# Patient Record
Sex: Female | Born: 1935 | Race: White | Hispanic: No
Health system: Midwestern US, Community
[De-identification: ages and names within clinical notes are randomized; demographics above are authoritative.]

## PROBLEM LIST (undated history)

## (undated) DIAGNOSIS — M1990 Unspecified osteoarthritis, unspecified site: Secondary | ICD-10-CM

## (undated) DIAGNOSIS — D508 Other iron deficiency anemias: Secondary | ICD-10-CM

## (undated) DIAGNOSIS — F419 Anxiety disorder, unspecified: Secondary | ICD-10-CM

## (undated) DIAGNOSIS — L0390 Cellulitis, unspecified: Secondary | ICD-10-CM

## (undated) DIAGNOSIS — N390 Urinary tract infection, site not specified: Secondary | ICD-10-CM

## (undated) DIAGNOSIS — G894 Chronic pain syndrome: Secondary | ICD-10-CM

## (undated) DIAGNOSIS — I2510 Atherosclerotic heart disease of native coronary artery without angina pectoris: Secondary | ICD-10-CM

## (undated) DIAGNOSIS — F329 Major depressive disorder, single episode, unspecified: Secondary | ICD-10-CM

## (undated) DIAGNOSIS — A0472 Enterocolitis due to Clostridium difficile, not specified as recurrent: Secondary | ICD-10-CM

## (undated) DIAGNOSIS — G8929 Other chronic pain: Secondary | ICD-10-CM

## (undated) DIAGNOSIS — M549 Dorsalgia, unspecified: Secondary | ICD-10-CM

## (undated) DIAGNOSIS — I728 Aneurysm of other specified arteries: Secondary | ICD-10-CM

## (undated) DIAGNOSIS — E119 Type 2 diabetes mellitus without complications: Secondary | ICD-10-CM

## (undated) DIAGNOSIS — G2581 Restless legs syndrome: Secondary | ICD-10-CM

## (undated) DIAGNOSIS — E278 Other specified disorders of adrenal gland: Secondary | ICD-10-CM

## (undated) DIAGNOSIS — J449 Chronic obstructive pulmonary disease, unspecified: Secondary | ICD-10-CM

## (undated) DIAGNOSIS — K5790 Diverticulosis of intestine, part unspecified, without perforation or abscess without bleeding: Secondary | ICD-10-CM

## (undated) DIAGNOSIS — Z96612 Presence of left artificial shoulder joint: Secondary | ICD-10-CM

## (undated) DIAGNOSIS — K5520 Angiodysplasia of colon without hemorrhage: Secondary | ICD-10-CM

## (undated) DIAGNOSIS — I10 Essential (primary) hypertension: Secondary | ICD-10-CM

## (undated) DIAGNOSIS — D539 Nutritional anemia, unspecified: Secondary | ICD-10-CM

## (undated) DIAGNOSIS — K760 Fatty (change of) liver, not elsewhere classified: Secondary | ICD-10-CM

## (undated) DIAGNOSIS — F32A Depression, unspecified: Secondary | ICD-10-CM

## (undated) DIAGNOSIS — M069 Rheumatoid arthritis, unspecified: Secondary | ICD-10-CM

## (undated) DIAGNOSIS — Z951 Presence of aortocoronary bypass graft: Secondary | ICD-10-CM

## (undated) DIAGNOSIS — I509 Heart failure, unspecified: Secondary | ICD-10-CM

## (undated) DIAGNOSIS — E785 Hyperlipidemia, unspecified: Secondary | ICD-10-CM

## (undated) DIAGNOSIS — Z96611 Presence of right artificial shoulder joint: Secondary | ICD-10-CM

## (undated) DIAGNOSIS — K922 Gastrointestinal hemorrhage, unspecified: Secondary | ICD-10-CM

## (undated) DIAGNOSIS — I480 Paroxysmal atrial fibrillation: Principal | ICD-10-CM

## (undated) HISTORY — DX: Dorsalgia, unspecified: M54.9

## (undated) HISTORY — DX: Depression, unspecified: F32.A

## (undated) HISTORY — DX: Rheumatoid arthritis, unspecified: M06.9

## (undated) HISTORY — PX: ANKLE SURGERY: SHX546

## (undated) HISTORY — DX: Unspecified osteoarthritis, unspecified site: M19.90

## (undated) HISTORY — DX: Paroxysmal atrial fibrillation: I48.0

## (undated) HISTORY — PX: SPINAL CORD STIMULATOR IMPLANT: SHX2422

## (undated) HISTORY — DX: Presence of aortocoronary bypass graft: Z95.1

## (undated) HISTORY — PX: INGUINAL HERNIA REPAIR: SUR1180

## (undated) HISTORY — DX: Hyperlipidemia, unspecified: E78.5

## (undated) HISTORY — PX: PARTIAL HYSTERECTOMY: SHX80

## (undated) HISTORY — PX: NASAL SINUS SURGERY: SHX719

## (undated) HISTORY — DX: Angiodysplasia of colon without hemorrhage: K55.20

## (undated) HISTORY — PX: TRIGGER FINGER RELEASE: SHX641

## (undated) HISTORY — DX: Atherosclerotic heart disease of native coronary artery without angina pectoris: I25.10

## (undated) HISTORY — DX: Presence of left artificial shoulder joint: Z96.612

## (undated) HISTORY — DX: Type 2 diabetes mellitus without complications: E11.9

## (undated) HISTORY — PX: CARPAL TUNNEL RELEASE: SHX101

## (undated) HISTORY — DX: Essential (primary) hypertension: I10

## (undated) HISTORY — DX: Aneurysm of other specified arteries: I72.8

## (undated) HISTORY — DX: Other chronic pain: G89.29

## (undated) HISTORY — DX: Major depressive disorder, single episode, unspecified: F32.9

## (undated) HISTORY — DX: Fatty (change of) liver, not elsewhere classified: K76.0

## (undated) HISTORY — DX: Chronic obstructive pulmonary disease, unspecified: J44.9

## (undated) HISTORY — DX: Nutritional anemia, unspecified: D53.9

## (undated) HISTORY — DX: Presence of right artificial shoulder joint: Z96.611

## (undated) HISTORY — DX: Diverticulosis of intestine, part unspecified, without perforation or abscess without bleeding: K57.90

## (undated) HISTORY — DX: Anxiety disorder, unspecified: F41.9

## (undated) HISTORY — PX: WRIST SURGERY: SHX841

---

## 1983-04-03 DIAGNOSIS — M069 Rheumatoid arthritis, unspecified: Secondary | ICD-10-CM

## 1983-04-03 DIAGNOSIS — M1990 Unspecified osteoarthritis, unspecified site: Secondary | ICD-10-CM | POA: Insufficient documentation

## 1983-04-03 HISTORY — DX: Rheumatoid arthritis, unspecified: M06.9

## 1983-04-03 HISTORY — DX: Unspecified osteoarthritis, unspecified site: M19.90

## 1995-08-01 DIAGNOSIS — I10 Essential (primary) hypertension: Secondary | ICD-10-CM

## 1995-08-01 HISTORY — DX: Essential (primary) hypertension: I10

## 1997-07-31 DIAGNOSIS — E1149 Type 2 diabetes mellitus with other diabetic neurological complication: Secondary | ICD-10-CM | POA: Insufficient documentation

## 1997-07-31 DIAGNOSIS — E119 Type 2 diabetes mellitus without complications: Secondary | ICD-10-CM

## 1997-07-31 HISTORY — DX: Type 2 diabetes mellitus without complications: E11.9

## 1998-12-13 HISTORY — PX: TOTAL SHOULDER REPLACEMENT: SUR1217

## 1999-03-03 ENCOUNTER — Encounter: Payer: Self-pay | Admitting: Family Medicine

## 1999-07-02 DIAGNOSIS — E785 Hyperlipidemia, unspecified: Secondary | ICD-10-CM

## 1999-07-02 HISTORY — DX: Hyperlipidemia, unspecified: E78.5

## 1999-12-02 ENCOUNTER — Encounter: Payer: Self-pay | Admitting: Family Medicine

## 1999-12-02 LAB — CONVERTED CEMR LAB: Hgb A1c MFr Bld: 5.4 %

## 2000-12-31 DIAGNOSIS — M810 Age-related osteoporosis without current pathological fracture: Secondary | ICD-10-CM | POA: Insufficient documentation

## 2001-05-03 ENCOUNTER — Encounter: Payer: Self-pay | Admitting: Family Medicine

## 2001-10-16 ENCOUNTER — Encounter: Admission: RE | Admit: 2001-10-16 | Discharge: 2001-10-16 | Payer: Self-pay | Admitting: Family Medicine

## 2001-10-16 ENCOUNTER — Encounter: Payer: Self-pay | Admitting: Family Medicine

## 2001-12-31 ENCOUNTER — Encounter: Payer: Self-pay | Admitting: Family Medicine

## 2002-01-14 ENCOUNTER — Encounter: Payer: Self-pay | Admitting: Family Medicine

## 2002-01-14 ENCOUNTER — Encounter: Admission: RE | Admit: 2002-01-14 | Discharge: 2002-01-14 | Payer: Self-pay | Admitting: Family Medicine

## 2002-07-16 ENCOUNTER — Encounter: Payer: Self-pay | Admitting: Family Medicine

## 2002-07-16 ENCOUNTER — Encounter: Admission: RE | Admit: 2002-07-16 | Discharge: 2002-07-16 | Payer: Self-pay | Admitting: Family Medicine

## 2002-07-16 DIAGNOSIS — K7689 Other specified diseases of liver: Secondary | ICD-10-CM | POA: Insufficient documentation

## 2002-09-01 DIAGNOSIS — I2510 Atherosclerotic heart disease of native coronary artery without angina pectoris: Secondary | ICD-10-CM

## 2002-09-01 HISTORY — DX: Atherosclerotic heart disease of native coronary artery without angina pectoris: I25.10

## 2002-12-02 ENCOUNTER — Encounter: Payer: Self-pay | Admitting: Family Medicine

## 2002-12-02 LAB — CONVERTED CEMR LAB: Microalbumin U total vol: 5.2 mg/L

## 2002-12-29 ENCOUNTER — Other Ambulatory Visit: Admission: RE | Admit: 2002-12-29 | Discharge: 2002-12-29 | Payer: Self-pay | Admitting: Family Medicine

## 2003-03-03 ENCOUNTER — Encounter: Payer: Self-pay | Admitting: Family Medicine

## 2003-03-03 LAB — CONVERTED CEMR LAB: Hgb A1c MFr Bld: 6.1 %

## 2003-11-11 ENCOUNTER — Other Ambulatory Visit: Payer: Self-pay

## 2003-11-22 HISTORY — PX: ROTATOR CUFF REPAIR: SHX139

## 2003-11-24 ENCOUNTER — Other Ambulatory Visit: Payer: Self-pay

## 2004-01-01 ENCOUNTER — Encounter: Payer: Medicare Other | Admitting: Unknown Physician Specialty

## 2004-02-01 ENCOUNTER — Encounter: Payer: Medicare Other | Admitting: Unknown Physician Specialty

## 2004-02-02 ENCOUNTER — Ambulatory Visit: Payer: Self-pay | Admitting: Family Medicine

## 2004-02-21 ENCOUNTER — Ambulatory Visit: Payer: Self-pay | Admitting: Family Medicine

## 2004-03-02 ENCOUNTER — Encounter: Payer: Medicare Other | Admitting: Unknown Physician Specialty

## 2004-04-02 ENCOUNTER — Encounter: Payer: Medicare Other | Admitting: Unknown Physician Specialty

## 2004-04-02 ENCOUNTER — Encounter: Payer: Self-pay | Admitting: Family Medicine

## 2004-04-02 LAB — CONVERTED CEMR LAB: Hgb A1c MFr Bld: 5.6 %

## 2004-05-02 ENCOUNTER — Ambulatory Visit: Payer: Self-pay | Admitting: Family Medicine

## 2004-05-03 ENCOUNTER — Encounter: Payer: Self-pay | Admitting: Family Medicine

## 2004-05-03 ENCOUNTER — Encounter: Payer: Medicare Other | Admitting: Unknown Physician Specialty

## 2004-05-04 ENCOUNTER — Ambulatory Visit: Payer: Self-pay | Admitting: Family Medicine

## 2004-05-15 ENCOUNTER — Ambulatory Visit: Payer: Self-pay | Admitting: Family Medicine

## 2004-05-31 ENCOUNTER — Encounter: Payer: Medicare Other | Admitting: Unknown Physician Specialty

## 2004-05-31 DIAGNOSIS — J449 Chronic obstructive pulmonary disease, unspecified: Secondary | ICD-10-CM

## 2004-05-31 HISTORY — DX: Chronic obstructive pulmonary disease, unspecified: J44.9

## 2004-06-07 ENCOUNTER — Encounter: Admission: RE | Admit: 2004-06-07 | Discharge: 2004-06-07 | Payer: Self-pay | Admitting: Specialist

## 2004-06-18 ENCOUNTER — Inpatient Hospital Stay: Payer: Medicare Other

## 2004-07-01 DIAGNOSIS — D509 Iron deficiency anemia, unspecified: Secondary | ICD-10-CM

## 2004-07-01 DIAGNOSIS — D539 Nutritional anemia, unspecified: Secondary | ICD-10-CM

## 2004-07-01 HISTORY — DX: Nutritional anemia, unspecified: D53.9

## 2004-07-04 ENCOUNTER — Ambulatory Visit: Payer: Self-pay | Admitting: Family Medicine

## 2004-07-19 ENCOUNTER — Ambulatory Visit: Payer: Medicare Other | Admitting: Internal Medicine

## 2004-07-31 ENCOUNTER — Ambulatory Visit: Payer: Medicare Other | Admitting: Internal Medicine

## 2004-08-31 ENCOUNTER — Ambulatory Visit: Payer: Medicare Other | Admitting: Internal Medicine

## 2004-09-21 ENCOUNTER — Ambulatory Visit: Payer: Self-pay | Admitting: Family Medicine

## 2004-09-30 ENCOUNTER — Ambulatory Visit: Payer: Medicare Other | Admitting: Internal Medicine

## 2004-10-30 ENCOUNTER — Ambulatory Visit: Payer: Self-pay | Admitting: Family Medicine

## 2004-10-31 ENCOUNTER — Encounter: Payer: Self-pay | Admitting: Family Medicine

## 2004-10-31 LAB — CONVERTED CEMR LAB
Hgb A1c MFr Bld: 6 %
Pap Smear: NORMAL

## 2004-11-01 ENCOUNTER — Other Ambulatory Visit: Admission: RE | Admit: 2004-11-01 | Discharge: 2004-11-01 | Payer: Self-pay | Admitting: Family Medicine

## 2004-11-01 ENCOUNTER — Ambulatory Visit: Payer: Self-pay | Admitting: Family Medicine

## 2004-11-03 ENCOUNTER — Ambulatory Visit: Payer: Medicare Other | Admitting: Internal Medicine

## 2004-11-08 ENCOUNTER — Ambulatory Visit: Payer: Self-pay | Admitting: Cardiology

## 2004-11-27 ENCOUNTER — Ambulatory Visit: Payer: Self-pay | Admitting: Family Medicine

## 2004-12-01 ENCOUNTER — Ambulatory Visit: Payer: Medicare Other | Admitting: Internal Medicine

## 2004-12-11 ENCOUNTER — Ambulatory Visit: Payer: Self-pay | Admitting: Family Medicine

## 2004-12-28 ENCOUNTER — Ambulatory Visit: Payer: Self-pay | Admitting: Family Medicine

## 2005-01-01 ENCOUNTER — Ambulatory Visit: Payer: Self-pay | Admitting: Family Medicine

## 2005-02-01 ENCOUNTER — Ambulatory Visit: Payer: Medicare Other | Admitting: Anesthesiology

## 2005-02-06 ENCOUNTER — Ambulatory Visit: Payer: Medicare Other | Admitting: Anesthesiology

## 2005-02-28 ENCOUNTER — Ambulatory Visit: Payer: Medicare Other | Admitting: Anesthesiology

## 2005-03-01 ENCOUNTER — Ambulatory Visit: Payer: Medicare Other | Admitting: Anesthesiology

## 2005-04-04 ENCOUNTER — Ambulatory Visit: Payer: Self-pay | Admitting: Family Medicine

## 2005-04-06 ENCOUNTER — Ambulatory Visit: Payer: Self-pay | Admitting: Family Medicine

## 2005-04-10 ENCOUNTER — Ambulatory Visit: Payer: Medicare Other | Admitting: Anesthesiology

## 2005-05-03 HISTORY — PX: EPIDURAL BLOCK INJECTION: SHX1516

## 2005-05-06 ENCOUNTER — Emergency Department: Payer: Medicare Other | Admitting: Emergency Medicine

## 2005-05-06 ENCOUNTER — Other Ambulatory Visit: Payer: Self-pay

## 2005-05-09 ENCOUNTER — Ambulatory Visit: Payer: Self-pay | Admitting: Family Medicine

## 2005-05-10 ENCOUNTER — Other Ambulatory Visit: Payer: Self-pay

## 2005-05-11 ENCOUNTER — Inpatient Hospital Stay: Payer: Medicare Other | Admitting: Internal Medicine

## 2005-05-23 ENCOUNTER — Ambulatory Visit: Payer: Self-pay | Admitting: Family Medicine

## 2005-05-26 ENCOUNTER — Ambulatory Visit: Payer: Self-pay | Admitting: Family Medicine

## 2005-05-30 ENCOUNTER — Ambulatory Visit: Payer: Medicare Other | Admitting: Anesthesiology

## 2005-05-30 ENCOUNTER — Ambulatory Visit: Payer: Self-pay | Admitting: Family Medicine

## 2005-06-06 ENCOUNTER — Ambulatory Visit: Payer: Self-pay | Admitting: Family Medicine

## 2005-06-14 ENCOUNTER — Ambulatory Visit: Payer: Medicare Other | Admitting: Anesthesiology

## 2005-06-20 ENCOUNTER — Ambulatory Visit: Payer: Self-pay | Admitting: Family Medicine

## 2005-06-21 ENCOUNTER — Ambulatory Visit: Payer: Medicare Other | Admitting: Anesthesiology

## 2005-06-29 ENCOUNTER — Ambulatory Visit: Payer: Self-pay | Admitting: Family Medicine

## 2005-07-31 ENCOUNTER — Ambulatory Visit: Payer: Medicare Other | Admitting: Anesthesiology

## 2005-08-16 ENCOUNTER — Ambulatory Visit: Payer: Self-pay | Admitting: Family Medicine

## 2005-08-29 ENCOUNTER — Ambulatory Visit: Payer: Medicare Other | Admitting: Anesthesiology

## 2005-09-26 ENCOUNTER — Ambulatory Visit: Payer: Self-pay | Admitting: Family Medicine

## 2005-10-31 HISTORY — PX: OTHER SURGICAL HISTORY: SHX169

## 2005-11-20 ENCOUNTER — Ambulatory Visit: Payer: Medicare Other | Admitting: Anesthesiology

## 2005-12-12 ENCOUNTER — Ambulatory Visit: Payer: Self-pay | Admitting: Family Medicine

## 2005-12-18 ENCOUNTER — Ambulatory Visit: Payer: Medicare Other | Admitting: Anesthesiology

## 2006-01-17 ENCOUNTER — Ambulatory Visit: Payer: Medicare Other | Admitting: Anesthesiology

## 2006-01-18 ENCOUNTER — Ambulatory Visit: Payer: Self-pay | Admitting: Family Medicine

## 2006-01-28 ENCOUNTER — Ambulatory Visit: Payer: Self-pay | Admitting: Family Medicine

## 2006-01-29 ENCOUNTER — Ambulatory Visit: Payer: Medicare Other | Admitting: Internal Medicine

## 2006-02-07 ENCOUNTER — Ambulatory Visit: Payer: Medicare Other | Admitting: Internal Medicine

## 2006-02-14 ENCOUNTER — Ambulatory Visit: Payer: Medicare Other | Admitting: Anesthesiology

## 2006-02-19 ENCOUNTER — Ambulatory Visit: Payer: Self-pay | Admitting: Family Medicine

## 2006-03-02 ENCOUNTER — Ambulatory Visit: Payer: Medicare Other | Admitting: Internal Medicine

## 2006-03-18 ENCOUNTER — Ambulatory Visit: Payer: Medicare Other | Admitting: Anesthesiology

## 2006-04-29 ENCOUNTER — Ambulatory Visit: Payer: Medicare Other | Admitting: Internal Medicine

## 2006-05-01 ENCOUNTER — Ambulatory Visit: Payer: Self-pay | Admitting: Family Medicine

## 2006-05-03 ENCOUNTER — Ambulatory Visit: Payer: Medicare Other | Admitting: Internal Medicine

## 2006-05-13 ENCOUNTER — Ambulatory Visit: Payer: Medicare Other | Admitting: Anesthesiology

## 2006-05-18 ENCOUNTER — Ambulatory Visit: Payer: Medicare Other | Admitting: Anesthesiology

## 2006-06-01 HISTORY — PX: LAMINECTOMY: SHX219

## 2006-06-13 ENCOUNTER — Ambulatory Visit: Payer: Medicare Other | Admitting: Anesthesiology

## 2006-06-13 HISTORY — PX: OTHER SURGICAL HISTORY: SHX169

## 2006-06-19 ENCOUNTER — Ambulatory Visit: Payer: Self-pay | Admitting: Family Medicine

## 2006-07-02 ENCOUNTER — Ambulatory Visit: Payer: Medicare Other | Admitting: Internal Medicine

## 2006-07-21 ENCOUNTER — Encounter: Payer: Self-pay | Admitting: Family Medicine

## 2006-07-21 DIAGNOSIS — G4700 Insomnia, unspecified: Secondary | ICD-10-CM

## 2006-07-31 ENCOUNTER — Ambulatory Visit: Payer: Medicare Other | Admitting: Anesthesiology

## 2006-08-16 ENCOUNTER — Ambulatory Visit: Payer: Medicare Other | Admitting: Internal Medicine

## 2006-08-16 ENCOUNTER — Encounter: Payer: Self-pay | Admitting: Family Medicine

## 2006-08-19 ENCOUNTER — Encounter: Payer: Self-pay | Admitting: Family Medicine

## 2006-09-01 ENCOUNTER — Ambulatory Visit: Payer: Medicare Other | Admitting: Internal Medicine

## 2006-09-03 ENCOUNTER — Encounter: Payer: Self-pay | Admitting: Family Medicine

## 2006-09-03 ENCOUNTER — Ambulatory Visit: Payer: Medicare Other | Admitting: Anesthesiology

## 2006-09-11 ENCOUNTER — Ambulatory Visit: Payer: Self-pay | Admitting: Family Medicine

## 2006-09-16 ENCOUNTER — Telehealth (INDEPENDENT_AMBULATORY_CARE_PROVIDER_SITE_OTHER): Payer: Self-pay | Admitting: *Deleted

## 2006-10-01 ENCOUNTER — Ambulatory Visit: Payer: Medicare Other | Admitting: Internal Medicine

## 2006-10-03 ENCOUNTER — Ambulatory Visit: Payer: Self-pay | Admitting: Family Medicine

## 2006-10-18 ENCOUNTER — Encounter: Payer: Self-pay | Admitting: Family Medicine

## 2006-11-01 ENCOUNTER — Ambulatory Visit: Payer: Medicare Other | Admitting: Internal Medicine

## 2006-11-04 ENCOUNTER — Ambulatory Visit: Payer: Medicare Other | Admitting: Anesthesiology

## 2006-11-04 ENCOUNTER — Encounter: Payer: Self-pay | Admitting: Family Medicine

## 2006-11-08 ENCOUNTER — Ambulatory Visit: Payer: Medicare Other | Admitting: Gastroenterology

## 2006-11-08 ENCOUNTER — Encounter: Payer: Self-pay | Admitting: Physician Assistant

## 2006-11-08 DIAGNOSIS — Q279 Congenital malformation of peripheral vascular system, unspecified: Secondary | ICD-10-CM | POA: Insufficient documentation

## 2006-11-15 ENCOUNTER — Ambulatory Visit: Payer: Medicare Other | Admitting: Gastroenterology

## 2006-11-27 ENCOUNTER — Encounter: Payer: Self-pay | Admitting: Family Medicine

## 2006-12-02 ENCOUNTER — Ambulatory Visit: Payer: Medicare Other | Admitting: Internal Medicine

## 2006-12-04 ENCOUNTER — Ambulatory Visit: Payer: Self-pay | Admitting: Family Medicine

## 2006-12-04 LAB — CONVERTED CEMR LAB
AST: 18 units/L (ref 0–37)
CO2: 30 meq/L (ref 19–32)
Calcium: 9.5 mg/dL (ref 8.4–10.5)
Chloride: 104 meq/L (ref 96–112)
Creatinine, Ser: 0.9 mg/dL (ref 0.4–1.2)
Glucose, Bld: 90 mg/dL (ref 70–99)

## 2006-12-06 ENCOUNTER — Ambulatory Visit: Payer: Self-pay | Admitting: Family Medicine

## 2006-12-10 ENCOUNTER — Encounter: Payer: Self-pay | Admitting: Family Medicine

## 2006-12-10 ENCOUNTER — Ambulatory Visit: Payer: Medicare Other | Admitting: Anesthesiology

## 2007-01-10 ENCOUNTER — Encounter: Payer: Self-pay | Admitting: Family Medicine

## 2007-01-10 ENCOUNTER — Ambulatory Visit: Payer: Medicare Other | Admitting: Anesthesiology

## 2007-01-23 ENCOUNTER — Ambulatory Visit: Payer: Self-pay | Admitting: Family Medicine

## 2007-01-23 LAB — CONVERTED CEMR LAB
Blood in Urine, dipstick: NEGATIVE
Ketones, urine, test strip: NEGATIVE
Urobilinogen, UA: 0.2

## 2007-01-28 ENCOUNTER — Ambulatory Visit: Payer: Medicare Other | Admitting: Anesthesiology

## 2007-02-04 ENCOUNTER — Encounter: Payer: Self-pay | Admitting: Family Medicine

## 2007-02-04 ENCOUNTER — Ambulatory Visit: Payer: Medicare Other | Admitting: Anesthesiology

## 2007-02-20 ENCOUNTER — Encounter: Payer: Self-pay | Admitting: Family Medicine

## 2007-02-25 ENCOUNTER — Encounter: Payer: Medicare Other | Admitting: General Practice

## 2007-03-03 ENCOUNTER — Encounter: Payer: Medicare Other | Admitting: General Practice

## 2007-03-03 ENCOUNTER — Ambulatory Visit: Payer: Medicare Other | Admitting: Internal Medicine

## 2007-03-06 ENCOUNTER — Ambulatory Visit: Payer: Medicare Other | Admitting: Anesthesiology

## 2007-03-06 ENCOUNTER — Encounter: Payer: Self-pay | Admitting: Family Medicine

## 2007-03-13 ENCOUNTER — Ambulatory Visit: Payer: Medicare Other | Admitting: Internal Medicine

## 2007-03-13 ENCOUNTER — Encounter: Payer: Self-pay | Admitting: Family Medicine

## 2007-03-17 ENCOUNTER — Ambulatory Visit: Payer: Self-pay | Admitting: Family Medicine

## 2007-03-17 ENCOUNTER — Emergency Department: Payer: Medicare Other | Admitting: Emergency Medicine

## 2007-03-17 DIAGNOSIS — M79609 Pain in unspecified limb: Secondary | ICD-10-CM | POA: Insufficient documentation

## 2007-03-24 ENCOUNTER — Ambulatory Visit: Payer: Self-pay

## 2007-03-24 ENCOUNTER — Encounter: Payer: Self-pay | Admitting: Family Medicine

## 2007-03-24 ENCOUNTER — Ambulatory Visit: Payer: Self-pay | Admitting: Cardiovascular Disease

## 2007-04-03 ENCOUNTER — Ambulatory Visit: Payer: Medicare Other | Admitting: Internal Medicine

## 2007-04-03 ENCOUNTER — Encounter: Payer: Medicare Other | Admitting: General Practice

## 2007-04-14 ENCOUNTER — Ambulatory Visit: Payer: Medicare Other | Admitting: Anesthesiology

## 2007-04-14 ENCOUNTER — Encounter: Payer: Self-pay | Admitting: Family Medicine

## 2007-04-17 ENCOUNTER — Ambulatory Visit: Payer: Self-pay | Admitting: Family Medicine

## 2007-04-21 ENCOUNTER — Ambulatory Visit: Payer: Medicare Other | Admitting: Pain Medicine

## 2007-05-04 ENCOUNTER — Encounter: Payer: Medicare Other | Admitting: General Practice

## 2007-05-07 ENCOUNTER — Ambulatory Visit: Payer: Medicare Other | Admitting: Anesthesiology

## 2007-05-07 ENCOUNTER — Encounter: Payer: Self-pay | Admitting: Family Medicine

## 2007-05-10 ENCOUNTER — Ambulatory Visit: Payer: Self-pay | Admitting: Family Medicine

## 2007-05-15 ENCOUNTER — Ambulatory Visit: Payer: Self-pay | Admitting: Family Medicine

## 2007-05-19 ENCOUNTER — Ambulatory Visit: Payer: Self-pay | Admitting: Family Medicine

## 2007-05-30 ENCOUNTER — Encounter: Payer: Self-pay | Admitting: Physician Assistant

## 2007-05-30 ENCOUNTER — Ambulatory Visit: Payer: Medicare Other | Admitting: Gastroenterology

## 2007-06-01 ENCOUNTER — Encounter: Payer: Medicare Other | Admitting: General Practice

## 2007-06-06 ENCOUNTER — Ambulatory Visit: Payer: Medicare Other | Admitting: Gastroenterology

## 2007-06-06 ENCOUNTER — Encounter: Payer: Self-pay | Admitting: Physician Assistant

## 2007-06-11 ENCOUNTER — Ambulatory Visit: Payer: Self-pay | Admitting: Internal Medicine

## 2007-06-20 ENCOUNTER — Ambulatory Visit: Payer: Self-pay | Admitting: Internal Medicine

## 2007-06-20 ENCOUNTER — Encounter: Admission: RE | Admit: 2007-06-20 | Discharge: 2007-06-20 | Payer: Self-pay | Admitting: Internal Medicine

## 2007-06-27 ENCOUNTER — Inpatient Hospital Stay (HOSPITAL_COMMUNITY): Admission: RE | Admit: 2007-06-27 | Discharge: 2007-07-02 | Payer: Self-pay | Admitting: Neurosurgery

## 2007-07-01 ENCOUNTER — Ambulatory Visit: Payer: Self-pay | Admitting: Physical Medicine & Rehabilitation

## 2007-07-21 ENCOUNTER — Ambulatory Visit: Payer: Self-pay | Admitting: Family Medicine

## 2007-07-21 DIAGNOSIS — L259 Unspecified contact dermatitis, unspecified cause: Secondary | ICD-10-CM

## 2007-07-21 LAB — CONVERTED CEMR LAB
Bilirubin Urine: NEGATIVE
Ketones, urine, test strip: NEGATIVE
Protein, U semiquant: 100

## 2007-08-01 ENCOUNTER — Ambulatory Visit: Payer: Medicare Other | Admitting: Internal Medicine

## 2007-08-15 ENCOUNTER — Ambulatory Visit: Payer: Medicare Other | Admitting: Internal Medicine

## 2007-08-17 ENCOUNTER — Encounter: Payer: Self-pay | Admitting: Family Medicine

## 2007-08-22 ENCOUNTER — Encounter: Payer: Self-pay | Admitting: Physician Assistant

## 2007-09-01 ENCOUNTER — Ambulatory Visit: Payer: Medicare Other | Admitting: Internal Medicine

## 2007-09-12 ENCOUNTER — Ambulatory Visit: Payer: Medicare Other | Admitting: Anesthesiology

## 2007-10-01 ENCOUNTER — Encounter (INDEPENDENT_AMBULATORY_CARE_PROVIDER_SITE_OTHER): Payer: Self-pay | Admitting: Physician Assistant

## 2007-10-09 ENCOUNTER — Encounter: Payer: Self-pay | Admitting: Family Medicine

## 2007-10-09 ENCOUNTER — Ambulatory Visit: Payer: Medicare Other | Admitting: Anesthesiology

## 2007-11-03 ENCOUNTER — Encounter: Payer: Self-pay | Admitting: Family Medicine

## 2007-11-03 ENCOUNTER — Ambulatory Visit: Payer: Medicare Other | Admitting: Anesthesiology

## 2007-11-12 ENCOUNTER — Encounter: Payer: Self-pay | Admitting: Physician Assistant

## 2007-12-01 ENCOUNTER — Encounter: Payer: Medicare Other | Admitting: Neurosurgery

## 2007-12-02 ENCOUNTER — Ambulatory Visit: Payer: Medicare Other | Admitting: Anesthesiology

## 2007-12-02 ENCOUNTER — Encounter: Payer: Medicare Other | Admitting: Neurosurgery

## 2007-12-16 ENCOUNTER — Ambulatory Visit: Payer: Medicare Other | Admitting: Anesthesiology

## 2008-01-01 ENCOUNTER — Encounter: Payer: Medicare Other | Admitting: Neurosurgery

## 2008-01-16 ENCOUNTER — Encounter: Admission: RE | Admit: 2008-01-16 | Discharge: 2008-01-16 | Payer: Self-pay | Admitting: Neurosurgery

## 2008-01-19 ENCOUNTER — Ambulatory Visit: Payer: Medicare Other | Admitting: Anesthesiology

## 2008-01-21 ENCOUNTER — Ambulatory Visit: Payer: Self-pay | Admitting: Family Medicine

## 2008-01-21 LAB — CONVERTED CEMR LAB
AST: 16 units/L (ref 0–37)
Albumin: 4 g/dL (ref 3.5–5.2)
BUN: 55 mg/dL — ABNORMAL HIGH (ref 6–23)
Basophils Absolute: 0 10*3/uL (ref 0.0–0.1)
Basophils Relative: 0.3 % (ref 0.0–3.0)
Calcium: 9.5 mg/dL (ref 8.4–10.5)
Chloride: 103 meq/L (ref 96–112)
Creatinine, Ser: 1.3 mg/dL — ABNORMAL HIGH (ref 0.4–1.2)
Creatinine,U: 99.4 mg/dL
Eosinophils Absolute: 0.1 10*3/uL (ref 0.0–0.7)
GFR calc Af Amer: 51 mL/min
GFR calc non Af Amer: 43 mL/min
HCT: 34.9 % — ABNORMAL LOW (ref 36.0–46.0)
Hgb A1c MFr Bld: 5.7 % (ref 4.6–6.0)
MCHC: 34.6 g/dL (ref 30.0–36.0)
MCV: 92 fL (ref 78.0–100.0)
Microalb Creat Ratio: 6 mg/g (ref 0.0–30.0)
Microalb, Ur: 0.6 mg/dL (ref 0.0–1.9)
Monocytes Absolute: 0.8 10*3/uL (ref 0.1–1.0)
Neutrophils Relative %: 61.3 % (ref 43.0–77.0)
RBC: 3.79 M/uL — ABNORMAL LOW (ref 3.87–5.11)
TSH: 0.96 microintl units/mL (ref 0.35–5.50)
Total Bilirubin: 0.6 mg/dL (ref 0.3–1.2)

## 2008-01-30 ENCOUNTER — Ambulatory Visit: Payer: Medicare Other | Admitting: Internal Medicine

## 2008-02-01 ENCOUNTER — Encounter: Payer: Medicare Other | Admitting: Neurosurgery

## 2008-02-24 ENCOUNTER — Encounter: Payer: Self-pay | Admitting: Family Medicine

## 2008-02-24 ENCOUNTER — Inpatient Hospital Stay (HOSPITAL_COMMUNITY): Admission: RE | Admit: 2008-02-24 | Discharge: 2008-02-29 | Payer: Self-pay | Admitting: Neurosurgery

## 2008-02-24 HISTORY — PX: LAMINECTOMY: SHX219

## 2008-03-09 ENCOUNTER — Telehealth: Payer: Self-pay | Admitting: Family Medicine

## 2008-03-09 ENCOUNTER — Ambulatory Visit: Payer: Self-pay | Admitting: Family Medicine

## 2008-03-10 ENCOUNTER — Encounter: Payer: Self-pay | Admitting: Family Medicine

## 2008-03-10 ENCOUNTER — Telehealth: Payer: Self-pay | Admitting: Family Medicine

## 2008-03-11 ENCOUNTER — Telehealth: Payer: Self-pay | Admitting: Family Medicine

## 2008-03-16 ENCOUNTER — Ambulatory Visit: Payer: Self-pay | Admitting: Internal Medicine

## 2008-03-16 ENCOUNTER — Inpatient Hospital Stay (HOSPITAL_COMMUNITY): Admission: EM | Admit: 2008-03-16 | Discharge: 2008-03-27 | Payer: Self-pay | Admitting: Emergency Medicine

## 2008-03-18 ENCOUNTER — Ambulatory Visit: Payer: Self-pay | Admitting: Gastroenterology

## 2008-03-30 ENCOUNTER — Ambulatory Visit: Payer: Self-pay | Admitting: Family Medicine

## 2008-04-06 ENCOUNTER — Telehealth: Payer: Self-pay | Admitting: Family Medicine

## 2008-04-07 ENCOUNTER — Encounter: Payer: Self-pay | Admitting: Internal Medicine

## 2008-04-07 ENCOUNTER — Ambulatory Visit: Payer: Self-pay | Admitting: Family Medicine

## 2008-04-07 ENCOUNTER — Telehealth: Payer: Self-pay | Admitting: Family Medicine

## 2008-04-07 ENCOUNTER — Ambulatory Visit: Payer: Self-pay | Admitting: Internal Medicine

## 2008-04-07 DIAGNOSIS — E669 Obesity, unspecified: Secondary | ICD-10-CM | POA: Insufficient documentation

## 2008-04-08 ENCOUNTER — Encounter: Payer: Self-pay | Admitting: Family Medicine

## 2008-04-08 ENCOUNTER — Ambulatory Visit: Payer: Self-pay | Admitting: Internal Medicine

## 2008-04-08 ENCOUNTER — Inpatient Hospital Stay (HOSPITAL_COMMUNITY): Admission: EM | Admit: 2008-04-08 | Discharge: 2008-04-22 | Payer: Self-pay | Admitting: Emergency Medicine

## 2008-04-08 LAB — CONVERTED CEMR LAB
ALT: 11 units/L (ref 0–35)
AST: 21 units/L (ref 0–37)
Basophils Absolute: 0.1 10*3/uL (ref 0.0–0.1)
Basophils Relative: 0.6 % (ref 0.0–3.0)
Bilirubin, Direct: 0.1 mg/dL (ref 0.0–0.3)
CO2: 28 meq/L (ref 19–32)
Calcium: 8.9 mg/dL (ref 8.4–10.5)
Chloride: 93 meq/L — ABNORMAL LOW (ref 96–112)
Creatinine, Ser: 1 mg/dL (ref 0.4–1.2)
Eosinophils Absolute: 0 10*3/uL (ref 0.0–0.7)
GFR calc non Af Amer: 58 mL/min
Lipase: 13 units/L (ref 11.0–59.0)
Lymphocytes Relative: 5 % — ABNORMAL LOW (ref 12.0–46.0)
MCHC: 34.6 g/dL (ref 30.0–36.0)
MCV: 89.7 fL (ref 78.0–100.0)
Neutrophils Relative %: 87.9 % — ABNORMAL HIGH (ref 43.0–77.0)
Platelets: 224 10*3/uL (ref 150–400)
RDW: 14.5 % (ref 11.5–14.6)
Sodium: 132 meq/L — ABNORMAL LOW (ref 135–145)
TSH: 0.88 microintl units/mL (ref 0.35–5.50)
Total Bilirubin: 0.9 mg/dL (ref 0.3–1.2)

## 2008-04-11 ENCOUNTER — Encounter: Payer: Self-pay | Admitting: Family Medicine

## 2008-04-12 ENCOUNTER — Encounter: Payer: Self-pay | Admitting: Internal Medicine

## 2008-04-12 ENCOUNTER — Encounter: Payer: Self-pay | Admitting: Family Medicine

## 2008-04-15 ENCOUNTER — Encounter: Payer: Self-pay | Admitting: Family Medicine

## 2008-04-19 DIAGNOSIS — F341 Dysthymic disorder: Secondary | ICD-10-CM

## 2008-04-22 ENCOUNTER — Encounter: Payer: Self-pay | Admitting: Family Medicine

## 2008-05-03 ENCOUNTER — Ambulatory Visit: Payer: Self-pay | Admitting: Family Medicine

## 2008-05-04 LAB — CONVERTED CEMR LAB
ALT: 13 units/L (ref 0–35)
Albumin: 3.4 g/dL — ABNORMAL LOW (ref 3.5–5.2)
BUN: 17 mg/dL (ref 6–23)
CO2: 26 meq/L (ref 19–32)
Calcium: 9.3 mg/dL (ref 8.4–10.5)
Creatinine, Ser: 0.8 mg/dL (ref 0.4–1.2)
GFR calc Af Amer: 90 mL/min
Glucose, Bld: 124 mg/dL — ABNORMAL HIGH (ref 70–99)
HCT: 31.5 % — ABNORMAL LOW (ref 36.0–46.0)
Hemoglobin: 10.8 g/dL — ABNORMAL LOW (ref 12.0–15.0)
Magnesium: 1.8 mg/dL (ref 1.5–2.5)
RBC: 3.52 M/uL — ABNORMAL LOW (ref 3.87–5.11)
Total Protein: 7.5 g/dL (ref 6.0–8.3)

## 2008-05-06 ENCOUNTER — Telehealth: Payer: Self-pay | Admitting: Family Medicine

## 2008-05-14 DIAGNOSIS — K5730 Diverticulosis of large intestine without perforation or abscess without bleeding: Secondary | ICD-10-CM | POA: Insufficient documentation

## 2008-05-14 DIAGNOSIS — K5289 Other specified noninfective gastroenteritis and colitis: Secondary | ICD-10-CM

## 2008-05-18 ENCOUNTER — Ambulatory Visit: Payer: Self-pay | Admitting: Family Medicine

## 2008-05-20 LAB — CONVERTED CEMR LAB
Eosinophils Absolute: 0.2 10*3/uL (ref 0.0–0.7)
Eosinophils Relative: 2.8 % (ref 0.0–5.0)
Lymphocytes Relative: 37.1 % (ref 12.0–46.0)
MCV: 91.3 fL (ref 78.0–100.0)
Monocytes Relative: 8.3 % (ref 3.0–12.0)
Neutrophils Relative %: 51.7 % (ref 43.0–77.0)
Platelets: 219 10*3/uL (ref 150–400)
WBC: 7.6 10*3/uL (ref 4.5–10.5)

## 2008-05-28 ENCOUNTER — Telehealth (INDEPENDENT_AMBULATORY_CARE_PROVIDER_SITE_OTHER): Payer: Self-pay | Admitting: Internal Medicine

## 2008-05-30 ENCOUNTER — Ambulatory Visit: Payer: Self-pay | Admitting: Cardiology

## 2008-05-30 ENCOUNTER — Telehealth: Payer: Self-pay | Admitting: Family Medicine

## 2008-05-30 ENCOUNTER — Inpatient Hospital Stay (HOSPITAL_COMMUNITY): Admission: EM | Admit: 2008-05-30 | Discharge: 2008-06-07 | Payer: Self-pay | Admitting: Emergency Medicine

## 2008-05-30 ENCOUNTER — Ambulatory Visit: Payer: Self-pay | Admitting: Internal Medicine

## 2008-05-31 ENCOUNTER — Telehealth: Payer: Self-pay | Admitting: Family Medicine

## 2008-05-31 ENCOUNTER — Encounter (INDEPENDENT_AMBULATORY_CARE_PROVIDER_SITE_OTHER): Payer: Self-pay | Admitting: *Deleted

## 2008-05-31 ENCOUNTER — Ambulatory Visit: Payer: Self-pay | Admitting: Internal Medicine

## 2008-06-01 ENCOUNTER — Ambulatory Visit: Payer: Self-pay | Admitting: Internal Medicine

## 2008-06-04 ENCOUNTER — Encounter: Payer: Self-pay | Admitting: Internal Medicine

## 2008-06-23 ENCOUNTER — Ambulatory Visit: Payer: Self-pay | Admitting: Family Medicine

## 2008-07-13 ENCOUNTER — Encounter: Payer: Self-pay | Admitting: Family Medicine

## 2008-07-26 ENCOUNTER — Ambulatory Visit: Payer: Self-pay | Admitting: Family Medicine

## 2008-08-03 ENCOUNTER — Telehealth: Payer: Self-pay | Admitting: Internal Medicine

## 2008-08-12 ENCOUNTER — Ambulatory Visit: Payer: Self-pay | Admitting: Internal Medicine

## 2008-09-01 ENCOUNTER — Encounter: Payer: Self-pay | Admitting: Family Medicine

## 2008-09-03 ENCOUNTER — Telehealth (INDEPENDENT_AMBULATORY_CARE_PROVIDER_SITE_OTHER): Payer: Self-pay | Admitting: Internal Medicine

## 2008-09-22 ENCOUNTER — Telehealth: Payer: Self-pay | Admitting: Family Medicine

## 2008-09-24 ENCOUNTER — Ambulatory Visit: Payer: Self-pay | Admitting: Family Medicine

## 2008-09-24 LAB — CONVERTED CEMR LAB
Basophils Relative: 0.2 % (ref 0.0–3.0)
Calcium: 9.3 mg/dL (ref 8.4–10.5)
Chloride: 107 meq/L (ref 96–112)
Glucose, Bld: 92 mg/dL (ref 70–99)
Hemoglobin: 12.7 g/dL (ref 12.0–15.0)
Lymphocytes Relative: 36.5 % (ref 12.0–46.0)
MCHC: 34.5 g/dL (ref 30.0–36.0)
Monocytes Relative: 8.6 % (ref 3.0–12.0)
Neutro Abs: 2.6 10*3/uL (ref 1.4–7.7)
Potassium: 3.7 meq/L (ref 3.5–5.1)
RBC: 4 M/uL (ref 3.87–5.11)
Sodium: 143 meq/L (ref 135–145)

## 2008-09-28 ENCOUNTER — Ambulatory Visit: Payer: Self-pay | Admitting: Family Medicine

## 2008-10-11 ENCOUNTER — Telehealth: Payer: Self-pay | Admitting: Family Medicine

## 2008-10-21 ENCOUNTER — Encounter: Payer: Self-pay | Admitting: Family Medicine

## 2008-10-26 ENCOUNTER — Ambulatory Visit: Payer: Self-pay | Admitting: Family Medicine

## 2008-10-26 LAB — CONVERTED CEMR LAB
Glucose, Urine, Semiquant: NEGATIVE
Specific Gravity, Urine: 1.02
pH: 6

## 2008-10-27 ENCOUNTER — Encounter: Payer: Self-pay | Admitting: Family Medicine

## 2008-11-01 ENCOUNTER — Encounter: Admission: RE | Admit: 2008-11-01 | Discharge: 2008-11-01 | Payer: Self-pay | Admitting: Neurosurgery

## 2008-11-02 ENCOUNTER — Telehealth: Payer: Self-pay | Admitting: Family Medicine

## 2008-12-02 ENCOUNTER — Encounter: Payer: Self-pay | Admitting: Family Medicine

## 2008-12-16 ENCOUNTER — Encounter: Admission: RE | Admit: 2008-12-16 | Discharge: 2008-12-16 | Payer: Self-pay | Admitting: Neurosurgery

## 2008-12-22 ENCOUNTER — Ambulatory Visit: Payer: Self-pay | Admitting: Family Medicine

## 2008-12-22 LAB — CONVERTED CEMR LAB
Glucose, Urine, Semiquant: NEGATIVE
Ketones, urine, test strip: NEGATIVE
Urobilinogen, UA: 0.2

## 2008-12-23 ENCOUNTER — Encounter: Payer: Self-pay | Admitting: Family Medicine

## 2008-12-29 ENCOUNTER — Ambulatory Visit: Payer: Self-pay | Admitting: Family Medicine

## 2009-01-06 ENCOUNTER — Telehealth: Payer: Self-pay | Admitting: Family Medicine

## 2009-01-07 ENCOUNTER — Ambulatory Visit: Payer: Self-pay | Admitting: Family Medicine

## 2009-01-17 ENCOUNTER — Ambulatory Visit: Payer: Self-pay | Admitting: Family Medicine

## 2009-01-17 LAB — CONVERTED CEMR LAB
Ketones, urine, test strip: NEGATIVE
Nitrite: NEGATIVE
Specific Gravity, Urine: 1.025
pH: 5

## 2009-01-20 ENCOUNTER — Encounter: Payer: Self-pay | Admitting: Family Medicine

## 2009-01-27 ENCOUNTER — Encounter: Admission: RE | Admit: 2009-01-27 | Discharge: 2009-01-27 | Payer: Self-pay | Admitting: Neurosurgery

## 2009-03-02 ENCOUNTER — Ambulatory Visit: Payer: Self-pay | Admitting: Family Medicine

## 2009-03-10 ENCOUNTER — Encounter: Payer: Self-pay | Admitting: Family Medicine

## 2009-04-05 ENCOUNTER — Ambulatory Visit: Payer: Self-pay | Admitting: Family Medicine

## 2009-04-18 ENCOUNTER — Encounter: Admission: RE | Admit: 2009-04-18 | Discharge: 2009-04-18 | Payer: Self-pay | Admitting: Neurosurgery

## 2009-05-11 ENCOUNTER — Ambulatory Visit: Payer: Self-pay | Admitting: Family Medicine

## 2009-05-23 ENCOUNTER — Ambulatory Visit: Payer: Self-pay | Admitting: Family Medicine

## 2009-05-25 ENCOUNTER — Encounter: Payer: Self-pay | Admitting: Family Medicine

## 2009-06-07 ENCOUNTER — Telehealth: Payer: Self-pay | Admitting: Family Medicine

## 2009-06-13 ENCOUNTER — Encounter: Payer: Self-pay | Admitting: Family Medicine

## 2009-06-21 ENCOUNTER — Ambulatory Visit: Payer: Self-pay | Admitting: Internal Medicine

## 2009-06-28 ENCOUNTER — Ambulatory Visit: Payer: Self-pay | Admitting: Family Medicine

## 2009-07-06 ENCOUNTER — Ambulatory Visit: Payer: Self-pay | Admitting: Family Medicine

## 2009-07-06 LAB — CONVERTED CEMR LAB
Glucose, Urine, Semiquant: NEGATIVE
Nitrite: NEGATIVE
Protein, U semiquant: 30
RBC / HPF: 0
Specific Gravity, Urine: 1.025

## 2009-07-11 ENCOUNTER — Ambulatory Visit: Payer: Self-pay | Admitting: Family Medicine

## 2009-08-24 ENCOUNTER — Encounter: Payer: Self-pay | Admitting: Family Medicine

## 2009-09-01 ENCOUNTER — Ambulatory Visit: Payer: Self-pay | Admitting: Family Medicine

## 2009-09-01 LAB — CONVERTED CEMR LAB
Specific Gravity, Urine: 1.025
Urobilinogen, UA: 0.2
pH: 6

## 2009-09-02 ENCOUNTER — Encounter: Payer: Self-pay | Admitting: Family Medicine

## 2009-09-07 ENCOUNTER — Inpatient Hospital Stay (HOSPITAL_COMMUNITY): Admission: RE | Admit: 2009-09-07 | Discharge: 2009-09-08 | Payer: Self-pay | Admitting: Neurosurgery

## 2009-09-08 ENCOUNTER — Telehealth: Payer: Self-pay | Admitting: Family Medicine

## 2009-09-12 ENCOUNTER — Telehealth (INDEPENDENT_AMBULATORY_CARE_PROVIDER_SITE_OTHER): Payer: Self-pay | Admitting: *Deleted

## 2009-09-14 ENCOUNTER — Inpatient Hospital Stay (HOSPITAL_COMMUNITY): Admission: RE | Admit: 2009-09-14 | Discharge: 2009-09-16 | Payer: Self-pay | Admitting: Neurosurgery

## 2009-09-26 ENCOUNTER — Encounter: Payer: Self-pay | Admitting: Family Medicine

## 2009-09-26 ENCOUNTER — Ambulatory Visit: Payer: Self-pay | Admitting: Family Medicine

## 2009-09-27 ENCOUNTER — Ambulatory Visit: Payer: Self-pay | Admitting: Family Medicine

## 2009-09-27 LAB — CONVERTED CEMR LAB
Calcium: 9.3 mg/dL (ref 8.4–10.5)
GFR calc non Af Amer: 57.98 mL/min (ref >60)
LDL Cholesterol: 133 mg/dL — ABNORMAL HIGH (ref 0–99)
Potassium: 4 meq/L (ref 3.5–5.1)
Sodium: 142 meq/L (ref 135–145)
Total CHOL/HDL Ratio: 7
VLDL: 37.8 mg/dL (ref 0.0–40.0)

## 2009-11-02 ENCOUNTER — Encounter: Payer: Self-pay | Admitting: Family Medicine

## 2009-11-08 ENCOUNTER — Encounter (INDEPENDENT_AMBULATORY_CARE_PROVIDER_SITE_OTHER): Payer: Self-pay | Admitting: *Deleted

## 2009-12-12 ENCOUNTER — Encounter: Payer: Self-pay | Admitting: Family Medicine

## 2010-01-05 ENCOUNTER — Telehealth: Payer: Self-pay | Admitting: Family Medicine

## 2010-01-19 ENCOUNTER — Ambulatory Visit: Payer: Self-pay | Admitting: Family Medicine

## 2010-02-14 ENCOUNTER — Encounter: Payer: Self-pay | Admitting: Family Medicine

## 2010-03-01 ENCOUNTER — Ambulatory Visit: Payer: Self-pay | Admitting: Family Medicine

## 2010-03-01 LAB — CONVERTED CEMR LAB: ALT: 12 units/L (ref 0–35)

## 2010-03-02 ENCOUNTER — Ambulatory Visit: Payer: Self-pay | Admitting: Family Medicine

## 2010-03-02 DIAGNOSIS — R5381 Other malaise: Secondary | ICD-10-CM

## 2010-03-02 DIAGNOSIS — R5383 Other fatigue: Secondary | ICD-10-CM

## 2010-03-02 LAB — CONVERTED CEMR LAB
Basophils Absolute: 0.1 10*3/uL (ref 0.0–0.1)
Eosinophils Absolute: 0.1 10*3/uL (ref 0.0–0.7)
Glucose, Bld: 92 mg/dL (ref 70–99)
Hgb A1c MFr Bld: 5.7 % (ref 4.6–6.5)
Lymphocytes Relative: 18.7 % (ref 12.0–46.0)
Lymphs Abs: 1.8 10*3/uL (ref 0.7–4.0)
MCHC: 34.1 g/dL (ref 30.0–36.0)
Monocytes Relative: 7.2 % (ref 3.0–12.0)
Platelets: 228 10*3/uL (ref 150.0–400.0)
RDW: 12.9 % (ref 11.5–14.6)
TSH: 0.94 microintl units/mL (ref 0.35–5.50)

## 2010-03-09 ENCOUNTER — Ambulatory Visit: Payer: Self-pay | Admitting: Family Medicine

## 2010-03-09 LAB — CONVERTED CEMR LAB
Glucose, Urine, Semiquant: NEGATIVE
Ketones, urine, test strip: NEGATIVE
Nitrite: NEGATIVE
Specific Gravity, Urine: 1.025
Urobilinogen, UA: 0.2
pH: 6

## 2010-03-15 ENCOUNTER — Ambulatory Visit: Payer: Self-pay | Admitting: Family Medicine

## 2010-03-30 ENCOUNTER — Telehealth: Payer: Self-pay | Admitting: Family Medicine

## 2010-04-20 ENCOUNTER — Ambulatory Visit
Admission: RE | Admit: 2010-04-20 | Discharge: 2010-04-20 | Payer: Self-pay | Source: Home / Self Care | Attending: Family Medicine | Admitting: Family Medicine

## 2010-04-20 ENCOUNTER — Other Ambulatory Visit: Payer: Self-pay | Admitting: Family Medicine

## 2010-04-20 LAB — LIPID PANEL
Cholesterol: 140 mg/dL (ref 0–200)
HDL: 26.2 mg/dL — ABNORMAL LOW (ref >39.00)
LDL Cholesterol: 93 mg/dL (ref 0–99)
Total CHOL/HDL Ratio: 5
Triglycerides: 102 mg/dL (ref 0.0–149.0)
VLDL: 20.4 mg/dL (ref 0.0–40.0)

## 2010-04-20 LAB — ALT: ALT: 14 U/L (ref 0–35)

## 2010-04-20 LAB — GLUCOSE, RANDOM: Glucose, Bld: 91 mg/dL (ref 70–99)

## 2010-04-20 LAB — AST: AST: 19 U/L (ref 0–37)

## 2010-04-20 LAB — HEMOGLOBIN A1C: Hgb A1c MFr Bld: 5.9 % (ref 4.6–6.5)

## 2010-04-26 ENCOUNTER — Ambulatory Visit
Admission: RE | Admit: 2010-04-26 | Discharge: 2010-04-26 | Payer: Self-pay | Source: Home / Self Care | Attending: Family Medicine | Admitting: Family Medicine

## 2010-05-02 NOTE — Consult Note (Signed)
Summary: Dr.Tara Stewart,[facility],Note  Dr.Tara Stewart,[facility],Note   Imported By: Beau Fanny 06/14/2009 15:15:00  _____________________________________________________________________  External Attachment:    Type:   Image     Comment:   External Document

## 2010-05-02 NOTE — Progress Notes (Signed)
Summary: Rx Alprazolam  Phone Note Refill Request Call back at [phone] Message from:  [facility] on June 07, 2009 2:20 PM  Refills Requested: Medication #1:  ALPRAZOLAM 0.5 MG TABS one tab by mouth at night as needed for anxiety.   Last Refilled: 04/02/2009 Received faxed refill request, please advise   Method Requested: Telephone to Pharmacy Initial call taken by: Linde Gillis CMA Duncan Dull),  June 07, 2009 2:21 PM  Follow-up for Phone Call        Rx called to pharmacy Follow-up by: Linde Gillis CMA Duncan Dull),  June 07, 2009 2:46 PM    Prescriptions: ALPRAZOLAM 0.5 MG TABS (ALPRAZOLAM) one tab by mouth at night as needed for anxiety  #60 x 1   Entered and Authorized by:   Shaune Leeks MD   Signed by:   Shaune Leeks MD on 06/07/2009   Method used:   Telephoned to ...       [facility] [address] (retail)       [address]       [place], [place]  [address]       Ph: [phone]       Fax: [phone]   RxID:   [id number]

## 2010-05-02 NOTE — Letter (Signed)
Summary: Dr.Mark Roy,[facility],Note  Dr.Mark Roy,[facility],Note   Imported By: Beau Fanny 02/22/2010 16:40:23  _____________________________________________________________________  External Attachment:    Type:   Image     Comment:   External Document

## 2010-05-02 NOTE — Letter (Signed)
Summary: [facility]  [facility]   Imported By: Lanelle Bal 10/13/2009 10:59:10  _____________________________________________________________________  External Attachment:    Type:   Image     Comment:   External Document  Appended Document: [facility]     Clinical Lists Changes  Observations: Added new observation of PAST MED HX: Hypertension (08/01/1995) Rheumatoid arthritis (04/03/1983) Osteoarthritis (04/03/1983) Anemia-iron deficiency (w/u by Dr Lorre Nick) (07/01/2004) Diabetes mellitus, type II (07/31/1997) Osteoporosis (12/31/2000) Coronary artery disease (09/01/2002) Hyperlipidemia (07/02/1999) COPD (05/31/2004) DIVERTICULOSIS OF COLON (ICD-562.10) COLITIS (ICD-558.9) ANXIETY DEPRESSION (ICD-300.4) INTESTINAL INFECTIONS DUE CLOSTRIDIUM DIFFICILE (ICD-008.45) OBESITY (ICD-278.00) HYPOKALEMIA (ICD-276.8) DIARRHEA (ICD-787.91) ECZEMA (ICD-692.9) BRONCHITIS, CHRONIC (ICD-491.9) LEG PAIN (ICD-729.5) ARTERIOVENOUS MALFORMATION, 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 [name] (10/13/2009 21:49)        Past History:  Past Medical History: Hypertension (08/01/1995) Rheumatoid arthritis (04/03/1983) Osteoarthritis (04/03/1983) Anemia-iron deficiency (w/u by Dr Lorre Nick) (07/01/2004) Diabetes mellitus, type II (07/31/1997) Osteoporosis (12/31/2000) Coronary artery disease (09/01/2002) Hyperlipidemia (07/02/1999) COPD (05/31/2004) DIVERTICULOSIS OF COLON (ICD-562.10) COLITIS (ICD-558.9) ANXIETY DEPRESSION (ICD-300.4) INTESTINAL INFECTIONS DUE CLOSTRIDIUM DIFFICILE (ICD-008.45) OBESITY (ICD-278.00) HYPOKALEMIA (ICD-276.8) DIARRHEA (ICD-787.91) ECZEMA (ICD-692.9) BRONCHITIS, CHRONIC  (ICD-491.9) LEG PAIN (ICD-729.5) ARTERIOVENOUS MALFORMATION, 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 [name]

## 2010-05-02 NOTE — Letter (Signed)
Summary: [facility]  [facility]   Imported By: Lanelle Bal 12/23/2009 12:03:17  _____________________________________________________________________  External Attachment:    Type:   Image     Comment:   External Document  Appended Document: [facility]     Clinical Lists Changes

## 2010-05-02 NOTE — Progress Notes (Signed)
Summary: pt does not anything from right source!!!!  Phone Note Call from Patient   Caller: Patient Call For: Shaune Leeks MD Summary of Call: Pt says she does not want to deal with right source, we are not to send any more scripts to them. Initial call taken by: Lowella Petties CMA,  September 12, 2009 11:50 AM

## 2010-05-02 NOTE — Letter (Signed)
Summary: Dr.Mark Roy,[facility],Note  Dr.Mark Roy,[facility],Note   Imported By: Beau Fanny 08/31/2009 10:57:59  _____________________________________________________________________  External Attachment:    Type:   Image     Comment:   External Document

## 2010-05-02 NOTE — Letter (Signed)
Summary: Nadara Eaton letter  [place] [facility]  [address] [place], [place] [address]   Phone: [phone]  Fax: [phone]       11/08/2009 MRN: [id number]  CARLINE DURA [address] [place], [place]  [address]  Dear Ms. Tania Ade,  [facility], and [facility] announce the retirement of Arta Silence, M.D., from full-time practice at the [facility] office effective September 29, 2009 and his plans of returning part-time.  It is important to [name] and to our practice that you understand that [facility] has seven physicians in our office for your health care needs.  We will continue to offer the same exceptional care that you have today.    [name] has spoken to many of you about his plans for retirement and returning part-time in the fall.   We will continue to work with you through the transition to schedule appointments for you in the office and meet the high standards that [facility] is committed to.   Again, it is with great pleasure that we share the news that [name] will return to [facility] in October of 2011 with a reduced schedule.    If you have any questions, or would like to request an appointment with one of our physicians, please call us at [phone] and press the option for Scheduling an appointment.  We take pleasure in providing you with excellent patient care and look forward to seeing you at your next office visit.  Our [facility] Physicians are:  Tillman Abide, M.D. [name], M.D. Roxy Manns, M.D. Kerby Nora, M.D. Hannah Beat, M.D. Ruthe Mannan, M.D. We proudly welcomed Raechel Ache, M.D. and Eustaquio Boyden, M.D. to the practice in July/August 2011.  Sincerely,  [facility]

## 2010-05-02 NOTE — Assessment & Plan Note (Signed)
Summary: ?UTI/CLE   Vital Signs:  Patient profile:   77 year old female Weight:      152.75 pounds Temp:     98.0 degrees F oral Pulse rate:   84 / minute Pulse rhythm:   regular BP sitting:   104 / 60  (left arm) Cuff size:   regular  Vitals Entered By: Sydell Axon LPN (September 01, 452 11:34 AM) CC: ? UTI, abd. pressure, urine urgency and frequency   History of Present Illness: Pt here for sxs she has had for a couple [place] but has been  drinking lots of fluids and didn't think abouit p[oss infection until laely. She has pressure, no fever or chills, some pain and burning at times. The sxs are variable. She denies fever or chills. She does not feel bad except when she has  the pressure and then can't go.  She is to have back surgery next week. She has had the preop eval. They know about her being seen today. She is allergic to Cipro and Septra.  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) 7)  Medial Epicondylitis, Left  (ICD-726.31) 8)  Diverticulosis of Colon  (ICD-562.10) 9)  Colitis  (ICD-558.9) 10)  Anxiety Depression  (ICD-300.4) 11)  Obesity  (ICD-278.00) 12)  Eczema  (ICD-692.9) 13)  Bronchitis, Chronic  (ICD-491.9) 14)  Leg Pain, Bilateral  (ICD-729.5) 15)  Arteriovenous Malformation, Gastric  (ICD-747.60) 16)  Pain, Chronic Nec (ON ELAVIL)  (ICD-338.29) 17)  Insomnia, Chronic  (ICD-307.42) 18)  Fatty Liver Disease  (ICD-571.8) 19)  Low Back Pain, Chronic ( PAIN CTR DR [facility])  (ICD-724.2) 20)  COPD  (ICD-496) 21)  Hyperlipidemia  (ICD-272.4) 22)  Coronary Artery Disease  (ICD-414.00) 23)  Osteoporosis  (ICD-733.00) 24)  Diabetes Mellitus, Type II  (ICD-250.00) 25)  Anemia-iron Deficiency  (ICD-280.9) 26)  Osteoarthritis   (ICD-715.90) 27)  Rheumatoid Arthritis  (ICD-714.0) 28)  Hypertension  (ICD-401.9)  Medications Prior to Update: 1)  Ramipril 10 Mg Caps (Ramipril) .... One Tab By Mouth At Night 2)  Actos 15 Mg Tabs (Pioglitazone Hcl) .[name].. 1 Daily By Mouth 3)  Vitamin D 1000 Unit Tabs (Cholecalciferol) .... Take One By Mouth Daily 4)  Alprazolam 0.5 Mg Tabs (Alprazolam) .... One Tab By Mouth At Night As Needed For Anxiety 5)  Robitussin Chest Congestion 100 Mg/57ml Syrp (Guaifenesin) .... As Needed 6)  Saline Mist 0.65 % Soln (Saline) .... As Needed 7)  Vear Clock Colon Health  Caps (Probiotic Product) .... Take One By Mouth Daily 8)  Amoxicillin-Pot Clavulanate 500-125 Mg Tabs (Amoxicillin-Pot Clavulanate) .... One Tab By Mouth Three Times A Day 9)  Amiloride-Hydrochlorothiazide 5-50 Mg Tabs (Amiloride-Hydrochlorothiazide) .... Take One By Mouth Two Times A Day As Needed 10)  Caltrate 600+d 600-400 Mg-Unit Tabs (Calcium Carbonate-Vitamin D) .... Take One By Mouth Daily 11)  Acetaminophen 500 Mg Tabs (Acetaminophen) .... Take 2 By Mouth Three Times A Day 12)  Endocet 5-325 Mg Tabs (Oxycodone-Acetaminophen) .... Take 1-2 By Mouth Every 8 Hours As Needed  Allergies: 1)  ! Naprosyn 2)  ! Cipro 3)  ! Septra 4)  ! Iron 5)  ! Morphine Sulfate (Morphine Sulfate)  Physical Exam  General:  Well-developed,well-nourished,in no acute distress; alert,appropriate  and cooperative throughout examination, nontoxic and looks well.. Head:  Normocephalic and atraumatic without obvious abnormalities. No apparent alopecia or balding. Sinuses NT. Eyes:  Conjunctiva clear bilaterally.  Ears:  R ear normal and L ear normal.   Nose:  External nasal examination shows no deformity or inflammation. Nasal mucosa are pink and moist without lesions or exudates. Mouth:  no erythema and no exudates.   Neck:  supple, no masses, and no cervical lymphadenopathy.   Chest Wall:  No deformities, masses, or tenderness noted. Lungs:  Normal  respiratory effort, chest expands symmetrically. Lungs are clear to auscultation, no crackles or wheezes. Heart:  Normal rate and regular rhythm. S1 and S2 normal without gallop, murmur, click, rub or other extra sounds. Abdomen:  No suprapubic tenderness. Msk:  No CVAT. Skin:  2 tick bite sites, irritated and slightly erythem at site iof bite, no surrounding erythema or induration.   Impression & Recommendations:  Problem # 1:  UTI (ICD-599.0) Assessment New  Will start Macrobid. Want her to give more urine so can send culture. She gave just enough for dip and spin. The following medications were removed from the medication list:    Amoxicillin-pot Clavulanate 500-125 Mg Tabs (Amoxicillin-pot clavulanate) ..... One tab by mouth three times a day Her updated medication list for this problem includes:    Macrobid 100 Mg Caps (Nitrofurantoin monohyd macro) ..... One tab by mouth two times a day  Encouraged to push clear liquids, get enough rest, and take acetaminophen as needed. To be seen in 10 days if no improvement, sooner if worse.  Orders: Specimen Handling (29528) T-Culture, Urine ([id number])  Problem # 2:  TICK BITES (ICD-E906.4) Assessment: New OBTW, has had three little bitty ticks on  her, does not think 24 hrs, but itch like crazy. Does not want to take Benadryl. Use Claritin.  Wash with soap and water.    Complete Medication List: 1)  Ramipril 10 Mg Caps (Ramipril) .... One tab by mouth at night 2)  Actos 15 Mg Tabs (Pioglitazone hcl) .[name].. 1 daily by mouth 3)  Alprazolam 0.5 Mg Tabs (Alprazolam) .... One tab by mouth at night as needed for anxiety 4)  Robitussin Chest Congestion 100 Mg/40ml Syrp (Guaifenesin) .... As needed 5)  Saline Mist 0.65 % Soln (Saline) .... As needed 6)  Vear Clock Colon Health Caps (Probiotic product) .... Take one by mouth daily 7)  Amiloride-hydrochlorothiazide 5-50 Mg Tabs (Amiloride-hydrochlorothiazide) .... Take one by mouth two times a day  as needed 8)  Endocet 5-325 Mg Tabs (Oxycodone-acetaminophen) .... Take 1-2 by mouth every 8 hours as needed 9)  Macrobid 100 Mg Caps (Nitrofurantoin monohyd macro) .... One tab by mouth two times a day  Other Orders: UA Dipstick W/ Micro (manual) (72536) Use Claritin  Patient Instructions: 1)  Start Macrobid and let surgery know.  2)  Use soap and water on bite sites and take Claritin or Allegra. 3)  RTC as needed. Prescriptions: MACROBID 100 MG CAPS (NITROFURANTOIN MONOHYD MACRO) one tab by mouth two times a day  #20 x 0   Entered and Authorized by:   Shaune Leeks MD   Signed by:   Shaune Leeks MD on 09/01/2009   Method used:   Electronically to        [facility] [address] (retail)       [address]       [place], [place]  [address]       Ph: [phone]  Fax: [phone]   RxID:   [id number]   Current Allergies (reviewed today): ! NAPROSYN ! CIPRO ! SEPTRA ! IRON ! MORPHINE SULFATE (MORPHINE SULFATE)  Laboratory Results   Urine Tests  Date/Time Received: September 01, 2009 11:36 AM  Date/Time Reported: September 01, 2009 11:36 AM   Routine Urinalysis   Color: yellow Appearance: Cloudy Glucose: negative   (Normal Range: Negative) Bilirubin: negative   (Normal Range: Negative) Ketone: negative   (Normal Range: Negative) Spec. Gravity: 1.025   (Normal Range: 1.003-1.035) Blood: large   (Normal Range: Negative) pH: 6.0   (Normal Range: 5.0-8.0) Protein: >=300   (Normal Range: Negative) Urobilinogen: 0.2   (Normal Range: 0-1) Leukocyte Esterace: large   (Normal Range: Negative)  Urine Microscopic WBC/HPF: TNTC RBC/HPF: 0-1 Bacteria/HPF: 4+ Epithelial/HPF: Rare

## 2010-05-02 NOTE — Assessment & Plan Note (Signed)
Summary: FOLLOW UP / LFW   Vital Signs:  Patient profile:   77 year old female Weight:      150 pounds Temp:     98.2 degrees F oral Pulse rate:   96 / minute Pulse rhythm:   regular BP sitting:   110 / 68  (left arm) Cuff size:   regular  Vitals Entered By: Sydell Axon LPN (July 11, 2009 11:30 AM) CC: Follow-up, feeling much better   History of Present Illness: Pt feels better. She is tolerating Augmentin and often doesn't get but 2 pills in her daily because of not being able to eat that many times and she is trying to eat diligently as told. She has 15 of thirty left.  She is definitely feeling better. She developed a left ear ache after being here last week whichnhas gone away. She still hasn't heard from the pain people in [place].  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) 4)  Medial Epicondylitis, Left  (ICD-726.31) 5)  Diverticulosis of Colon  (ICD-562.10) 6)  Colitis  (ICD-558.9) 7)  Anxiety Depression  (ICD-300.4) 8)  Obesity  (ICD-278.00) 9)  Eczema  (ICD-692.9) 10)  Bronchitis, Chronic  (ICD-491.9) 11)  Leg Pain, Bilateral  (ICD-729.5) 12)  Arteriovenous Malformation, Gastric  (ICD-747.60) 13)  Pain, Chronic Nec (ON ELAVIL)  (ICD-338.29) 14)  Insomnia, Chronic  (ICD-307.42) 15)  Fatty Liver Disease  (ICD-571.8) 16)  Low Back Pain, Chronic ( PAIN CTR DR [facility])  (ICD-724.2) 17)  COPD  (ICD-496) 18)  Hyperlipidemia  (ICD-272.4) 19)  Coronary Artery Disease  (ICD-414.00) 20)  Osteoporosis  (ICD-733.00) 21)  Diabetes Mellitus, Type II  (ICD-250.00) 22)  Anemia-iron Deficiency  (ICD-280.9) 23)  Osteoarthritis  (ICD-715.90) 24)  Rheumatoid Arthritis  (ICD-714.0) 25)  Hypertension  (ICD-401.9)  Medications Prior to Update: 1)  Endocet 10-325 Mg Tabs (Oxycodone-Acetaminophen) .[name].. 1 Every 4 Hours As Needed Pain 2)   Ramipril 10 Mg Caps (Ramipril) .... One Tab By Mouth At Night 3)  Actos 15 Mg Tabs (Pioglitazone Hcl) .[name].. 1 Daily By Mouth 4)  Vitamin D 1000 Unit Tabs (Cholecalciferol) .... Take One By Mouth Daily 5)  Alprazolam 0.5 Mg Tabs (Alprazolam) .... One Tab By Mouth At Night As Needed For Anxiety 6)  Robitussin Chest Congestion 100 Mg/53ml Syrp (Guaifenesin) .... As Needed 7)  Saline Mist 0.65 % Soln (Saline) .... As Needed 8)  Vear Clock Colon Health  Caps (Probiotic Product) .... Take One By Mouth Daily 9)  Amoxicillin-Pot Clavulanate 500-125 Mg Tabs (Amoxicillin-Pot Clavulanate) .... One Tab By Mouth Three Times A Day  Allergies: 1)  ! Naprosyn 2)  ! Cipro 3)  ! Septra 4)  ! Iron  Physical Exam  General:  Well-developed,well-nourished,in no acute distress; alert,appropriate and cooperative throughout examination, interactive and sitting up today. Head:  Normocephalic and atraumatic without obvious abnormalities. No apparent alopecia or balding. Sinuses NT. Eyes:  Conjunctiva clear bilaterally.  Ears:  R ear normal and L ear normal.   Nose:  External nasal examination shows no deformity or inflammation. Nasal mucosa are pink and moist without lesions or exudates. Mouth:  no erythema and no exudates.   Neck:  supple, no masses, and no cervical lymphadenopathy.   Chest Wall:  No deformities, masses, or tenderness noted. Lungs:  Normal respiratory effort, chest expands symmetrically. Lungs are  clear to auscultation, no crackles or wheezes. Heart:  Normal rate and regular rhythm. S1 and S2 normal without gallop, murmur, click, rub or other extra sounds. Abdomen:  Bowel sounds hypoactive, abdomen soft and somewhat minimally  tender, significantly better than last time,  in the LLQ  without masses, organomegaly or hernias noted. Heel tap, obturator, SLR all neg for abd signs. Easier  for her to lay down and get up from the exam table due to lower back pain.   Impression &  Recommendations:  Problem # 1:  ABDOMINAL PAIN, LEFT LOWER QUADRANT (ICD-789.04) Assessment Improved  Presumably diverticulitis. Can tyopically only get two Augmentin in her a day due to eating. Will have to worry about C. Difficlie again. Take akll the Abs as she is not baseline yet.  Discussed symptom control with the patient.   Complete Medication List: 1)  Ramipril 10 Mg Caps (Ramipril) .... One tab by mouth at night 2)  Actos 15 Mg Tabs (Pioglitazone hcl) .[name].. 1 daily by mouth 3)  Vitamin D 1000 Unit Tabs (Cholecalciferol) .... Take one by mouth daily 4)  Alprazolam 0.5 Mg Tabs (Alprazolam) .... One tab by mouth at night as needed for anxiety 5)  Robitussin Chest Congestion 100 Mg/78ml Syrp (Guaifenesin) .... As needed 6)  Saline Mist 0.65 % Soln (Saline) .... As needed 7)  [place]ips Colon Health Caps (Probiotic product) .... Take one by mouth daily 8)  Amoxicillin-pot Clavulanate 500-125 Mg Tabs (Amoxicillin-pot clavulanate) .... One tab by mouth three times a day 9)  Amiloride-hydrochlorothiazide 5-50 Mg Tabs (Amiloride-hydrochlorothiazide) .... Take one by mouth two times a day as needed 10)  Caltrate 600+d 600-400 Mg-unit Tabs (Calcium carbonate-vitamin d) .... Take one by mouth daily 11)  Acetaminophen 500 Mg Tabs (Acetaminophen) .... Take 2 by mouth three times a day 12)  Endocet 5-325 Mg Tabs (Oxycodone-acetaminophen) .... Take 1-2 by mouth every 8 hours as needed  Current Allergies (reviewed today): ! NAPROSYN ! CIPRO ! SEPTRA ! IRON

## 2010-05-02 NOTE — Letter (Signed)
Summary: Dr.Mark Roy,[facility],Note  Dr.Mark Roy,[facility],Note   Imported By: Beau Fanny 05/31/2009 15:54:50  _____________________________________________________________________  External Attachment:    Type:   Image     Comment:   External Document

## 2010-05-02 NOTE — Assessment & Plan Note (Signed)
Summary: SKIN LESION REMOVAL  CYD   Vital Signs:  Patient profile:   77 year old female Weight:      151 pounds Temp:     97.9 degrees F oral Pulse rate:   84 / minute Pulse rhythm:   regular BP sitting:   104 / 60  (left arm) Cuff size:   regular  Vitals Entered By: Sydell Axon LPN (May 23, 2009 10:32 AM) CC: Removal of skin lesion on right leg   History of Present Illness: Pt here to have a verrucal wart like mole removed from her right leg just medial to the popliteal fossa that she consistently picks at and gets bleeding. It has been there for over a year and causes her significant grief. She does not want the speciment sent off, incurring more charge but wants it removed. She also has a lesion on the left lateral forehead just above the eyebrow that comes and goes, has never totally quite healed. She otherwise feels well today. She must have significant work on [employer] and needs new glasses.  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) 3)  Medial Epicondylitis, Left  (ICD-726.31) 4)  Diverticulosis of Colon  (ICD-562.10) 5)  Colitis  (ICD-558.9) 6)  Anxiety Depression  (ICD-300.4) 7)  Obesity  (ICD-278.00) 8)  Eczema  (ICD-692.9) 9)  Bronchitis, Chronic  (ICD-491.9) 10)  Leg Pain, Bilateral  (ICD-729.5) 11)  Arteriovenous Malformation, Gastric  (ICD-747.60) 12)  Pain, Chronic Nec (ON ELAVIL)  (ICD-338.29) 13)  Insomnia, Chronic  (ICD-307.42) 14)  Fatty Liver Disease  (ICD-571.8) 15)  Low Back Pain, Chronic ( PAIN [facility])  (ICD-724.2) 16)  COPD  (ICD-496) 17)  Hyperlipidemia  (ICD-272.4) 18)  Coronary Artery Disease  (ICD-414.00) 19)  Osteoporosis  (ICD-733.00) 20)  Diabetes Mellitus, Type II  (ICD-250.00) 21)  Anemia-iron Deficiency  (ICD-280.9) 22)  Osteoarthritis  (ICD-715.90) 23)  Rheumatoid Arthritis  (ICD-714.0) 24)  Hypertension  (ICD-401.9)  Medications  Prior to Update: 1)  Endocet 10-325 Mg Tabs (Oxycodone-Acetaminophen) .[name].. 1 Every 4 Hours As Needed Pain 2)  Ramipril 10 Mg Caps (Ramipril) .... One Tab By Mouth At Night 3)  Actos 15 Mg Tabs (Pioglitazone Hcl) .[name].. 1 Daily By Mouth 4)  Vitamin D 1000 Unit Tabs (Cholecalciferol) .... Take One By Mouth Daily 5)  Sertraline Hcl 50 Mg Tabs (Sertraline Hcl) .... One Tab By Mouth Daily 6)  Alprazolam 0.5 Mg Tabs (Alprazolam) .... One Tab By Mouth At Night As Needed For Anxiety 7)  Zithromax Z-Pak 250 Mg Tabs (Azithromycin) .... As Dir  Allergies: 1)  ! Naprosyn 2)  ! Penicillin 3)  ! Cipro 4)  ! Septra 5)  ! Iron  Physical Exam  General:  Well-developed,well-nourished,in no acute distress; alert,appropriate and cooperative throughout examination. Looks good and more calm than I have seen her in quite a while, happy today. Head:  Normocephalic and atraumatic without obvious abnormalities. No apparent alopecia or balding. Sinuses NT.  Eyes:  Conjunctiva clear bilaterally.  Ears:  External ear exam shows no significant lesions or deformities.  Otoscopic examination reveals clear canals, tympanic membranes are intact bilaterally without bulging, retraction, inflammation or discharge. Hearing is grossly normal bilaterally. Nose:  External nasal examination shows no deformity or inflammation. Nasal mucosa are pink and moist without lesions or exudates. Mouth:  Oral mucosa and oropharynx without lesions or exudates.  Teeth in good repair.  Slight mouth breathing. Skin:  1 cm lesion on the forehead just above the lateral portion of the left eyebrow, maculopapular with umbilication and poorly distinct borders that are irregular, erythematously skin colored.  Right leg just medial, proxinal  to the popliteal fossa has a 4mm verrucal papule nonerythem with distinct borders and no umbilication. Area prepped in usual sterilee manner, 1 % lido with epi used for local. Excision was done with scissors and the  dessication and curretage done multiple times. Sterile dressing with Neosporin placed. Pt tolerated well.   Impression & Recommendations:  Problem # 1:  NEOPLASM OF UNCERTAIN BEHAVIOR OF SKIN, R POP FOSSA (ICD-238.2) Assessment Improved Excvised and dressed. RTC if becomed inflamed.  Problem # 2:  LESION, FACE, L FOREHEAD (ICD-238.2) Assessment: New Appears to be poss BCC or SCC. To derm for bx and trmt.  Complete Medication List: 1)  Endocet 10-325 Mg Tabs (Oxycodone-acetaminophen) .[name].. 1 every 4 hours as needed pain 2)  Ramipril 10 Mg Caps (Ramipril) .... One tab by mouth at night 3)  Actos 15 Mg Tabs (Pioglitazone hcl) .[name].. 1 daily by mouth 4)  Vitamin D 1000 Unit Tabs (Cholecalciferol) .... Take one by mouth daily 5)  Alprazolam 0.5 Mg Tabs (Alprazolam) .... One tab by mouth at night as needed for anxiety  Patient Instructions: 1)  RTC as needed.  Current Allergies (reviewed today): ! NAPROSYN ! PENICILLIN ! CIPRO ! SEPTRA ! IRON

## 2010-05-02 NOTE — Progress Notes (Signed)
Summary: refill request for ramipril  Phone Note Refill Request Message from:  Fax from Pharmacy  Refills Requested: Medication #1:  RAMIPRIL 10 MG CAPS one tab by mouth at night Form from right source is on your shelf.  Initial call taken by: Lowella Petties CMA,  September 08, 2009 10:31 AM  Follow-up for Phone Call        Form and script faxed.    Prescriptions: RAMIPRIL 10 MG CAPS (RAMIPRIL) one tab by mouth at night  #90 x 3   Entered and Authorized by:   Shaune Leeks MD   Signed by:   Shaune Leeks MD on 09/08/2009   Method used:   Printed then mailed to ...       [facility] [address] [address] (retail)       [address]       [place], [place]  [address]       Ph: [phone]       Fax: [phone]   RxID:   [id number]

## 2010-05-02 NOTE — Assessment & Plan Note (Signed)
Summary: 8:45 COUGH,FEVER,CONGESTION/CLE   Vital Signs:  Patient profile:   77 year old female Weight:      149 pounds Temp:     98 degrees F oral Resp:     16 per minute BP sitting:   120 / 68  (left arm) Cuff size:   regular  Vitals Entered By: Mervin Hack CMA Duncan Dull) (June 21, 2009 8:48 AM) CC: cough, congestion   History of Present Illness: "I've got a bug" Got a z-pak and it knocked it out 6 Yip ago But symptoms have recurred seems to be seperate illness Thinks she got sick from watching her grandson  Then had diarrhea for 2 Rubis, now is bound up running with Mom, sister just died---lots of stress  Still congested --started a couple of Degroote ago Head is ringing some trouble breathing from congestion at night Some easier DOE Fever--esp at night Cough--mostly dry does have PND no sore throat--lots of mucus there  Allergies: 1)  ! Naprosyn 2)  ! Cipro 3)  ! Septra 4)  ! Iron  Past History:  Past medical, surgical, family and social histories (including risk factors) reviewed for relevance to current acute and chronic problems.  Past Medical History: Reviewed history from 05/14/2008 and no changes required. Hypertension (08/01/1995) Rheumatoid arthritis (04/03/1983) Osteoarthritis (04/03/1983) Anemia-iron deficiency (w/u by Dr Lorre Nick) (07/01/2004) Diabetes mellitus, type II (07/31/1997) Osteoporosis (12/31/2000) Coronary artery disease (09/01/2002) Hyperlipidemia (07/02/1999) COPD (05/31/2004)  DIVERTICULOSIS OF COLON (ICD-562.10) COLITIS (ICD-558.9) ANXIETY DEPRESSION (ICD-300.4) INTESTINAL INFECTIONS DUE CLOSTRIDIUM DIFFICILE (ICD-008.45) OBESITY (ICD-278.00) OBESITY (ICD-278.00) HYPOKALEMIA (ICD-276.8) DIARRHEA (ICD-787.91) ECZEMA (ICD-692.9) BRONCHITIS, CHRONIC (ICD-491.9) LEG PAIN (ICD-729.5) LEG PAIN, BILATERAL (ICD-729.5) ARTERIOVENOUS MALFORMATION, GASTRIC (ICD-747.60) UTI (ICD-599.0) PAIN, CHRONIC NEC (ON ELAVIL)  (ICD-338.29) INSOMNIA, CHRONIC (ICD-307.42) FATTY LIVER DISEASE (ICD-571.8) LOW BACK PAIN, CHRONIC ( PAIN [facility]) (ICD-724.2) COPD (ICD-496) HYPERLIPIDEMIA (ICD-272.4) CORONARY ARTERY DISEASE (ICD-414.00) OSTEOPOROSIS (ICD-733.00) DIABETES MELLITUS, TYPE II (ICD-250.00) ANEMIA-IRON DEFICIENCY (ICD-280.9) OSTEOARTHRITIS (ICD-715.90) RHEUMATOID ARTHRITIS (ICD-714.0) HYPERTENSION (ICD-401.9)  Past Surgical History: Reviewed history from 06/24/2008 and no changes required. NSVD x 4 Sinus Operation Inguinal Hernia Repair Hysterectomy Ovaries intact, Dysmennorhea w/ A/P Repair Carpal Tunnel Repair, Bilat Trigger Thumb Repair R Neuroma Repair R Wrist R Ankle ORIF Shoulder Repl R ([place]) 12/13/1998 DEXA Osteoporosis Hip, Penia spine 12/2000 CT Abd Splenic Art Aneurysm unchg'd Pulm Nod unchg'd Fatty Liver 07/16/2002 Adenosine Myoview abnml 08/25/2002 ECHO mild LVH Calcif Aortic Valve Tr AR,MR Mild TR 08/20/2002 Carotid U/S nml 09/08/2002 Subclav/Aortic U/S nml 09/08/2002 CATH [place]) mod CAD distal L Circumflex and Sm RCA 09/09/2002 MRI Brain w/ and w/o sm vess dz o/w nml 10/18/2003 L Rotator Cuff Repair ([place]) 11/22/2003 [facility] Bronchitis COPD 3/19-3/24/2006 CT Chest COPD Splenic Aneurysm Degen Bony Dz 06/20/2004 CT Abd  sm lesion R adrenal close to R lobe of Liver 05/06/2005 Epidural Inj x3 05/2005 L Long Finger Tendon Reallignment ([place]) 10/31/2005 CT Abd no change vs 05/2005  02/07/2006 Lumbar Epidural 06/13/2006 Laminectomy L4/5 Spinal Stenosis (Dr [name]) 06/2006 EGD Gastric Anioectasias (Dr Marva Panda) 11/08/2006 LE Vascular U/S Nml 03/24/2007 Carotid U/S mild heterogenous plaque bilat 40-59% RICA, 0-39% LICA 03/24/2007 Periph Vasc Dz Eval LE (Dr Excell Seltzer) nml 03/24/2007 Laminectomy L3/4 Ant/Lat interbody fusion and Lat Arthrodesis w/ plate using XLIF (Dr [name]) 02/24/2008 HOSP C. Diff COPD DM Htn Dehydration UTI Anemia/transfusion Hypokalemia 12/15-12/26/2009 HOSP Severe/Recurr  C.  Diff, Debillitation, Hypokal, Malnutrit, COPD 1/7-1/21/2010 CT Abd w/ NML 04/08/08   CT Pelvis W/ Findingd c/w C.Diff Colitis, L3 Compr Abnmlty  04/08/08 Flex Sig  Colitis Mod divertics Pseudomembr Colitis (Dr Juanda Chance) 04/12/2008 HOSP Recurr C. Diff 2/28-06/07/2008 CT Abd Persist Thickening L Colon Wall    CT Pelvis  Thickening Sigmoid Colon Wall   05/31/2008 Flex Sig Pseudomembr Colitis (Dr Juanda Chance) 35/2010  Family History: Reviewed history from 08/12/2008 and no changes required. Father dec MI Enlarged Heart ETOHIC Mother A 57 Hypotension Brother A DM Sister A DM No FH of Colon Cancer:  Social History: Reviewed history from 07/21/2006 and no changes required. Occupation: [employer] 9hr days 6 days/wk Married Widow 1972  4 children Current Smoker PPD Alcohol use-no Drug use-no  Review of Systems       Did have some vomiting last week Appetite has been fine   Physical Exam  General:  alert.  NAD Head:  no frontal or maxillary tenderness Ears:  R ear normal and L ear normal.   Nose:  moderate inflammation with slight green mucus on left Mouth:  no erythema and no exudates.   Neck:  supple, no masses, and no cervical lymphadenopathy.   Lungs:  normal respiratory effort and normal breath sounds.     Impression & Recommendations:  Problem # 1:  SINUSITIS - ACUTE-NOS (ICD-461.9) Assessment New  seems to be new illness from last month will Rx with amoxil analgesics okay to use probiotic while on the antibiotic  Her updated medication list for this problem includes:    Amoxicillin 500 Mg Tabs (Amoxicillin) .[name]... 2 tabs by mouth two times a day for sinus infection  Orders: Prescription Created Electronically [phone])  Complete Medication List: 1)  Endocet 10-325 Mg Tabs (Oxycodone-acetaminophen) .[name].. 1 every 4 hours as needed pain 2)  Ramipril 10 Mg Caps (Ramipril) .... One tab by mouth at night 3)  Actos 15 Mg Tabs (Pioglitazone hcl) .[name].. 1 daily by mouth 4)  Vitamin D 1000 Unit Tabs  (Cholecalciferol) .... Take one by mouth daily 5)  Alprazolam 0.5 Mg Tabs (Alprazolam) .... One tab by mouth at night as needed for anxiety 6)  Amoxicillin 500 Mg Tabs (Amoxicillin) .... 2 tabs by mouth two times a day for sinus infection  Patient Instructions: 1)  Please schedule a follow-up appointment as needed .  Prescriptions: AMOXICILLIN 500 MG TABS (AMOXICILLIN) 2 tabs by mouth two times a day for sinus infection  #40 x 0   Entered and Authorized by:   Cindee Salt MD   Signed by:   Cindee Salt MD on 06/21/2009   Method used:   Electronically to        [facility] [address] [address]* (retail)       [address]       [place], [place]  [address]       Ph: [phone]       Fax: [phone]   RxID:   [id number]   Current Allergies (reviewed today): ! NAPROSYN ! CIPRO ! SEPTRA ! IRON

## 2010-05-02 NOTE — Assessment & Plan Note (Signed)
Summary: F/U NOT FEELING BETTER/CLE   Vital Signs:  Patient profile:   77 year old female Weight:      145.75 pounds Temp:     97.0 degrees F oral Pulse rate:   88 / minute Pulse rhythm:   regular BP sitting:   110 / 70  (left arm) Cuff size:   regular  Vitals Entered By: Sydell Axon LPN (March 09, 2010 10:18 AM) CC: Not feeling any better, body aches, feels like she has a fever and infection in her body, urine frequency and urgency   History of Present Illness: Pt here for recheck, just doesn't feel good...actually feels very tired and warm as if she has a temperature. She has occas urinary pressure at the end and has multiple nocturia, but not all the time. She is coughing some but not an overwhelming problem. She is still taking nothing for diabetes but her control historically has been very good and she tries hard to watch what she eats. She has to take her daughter next week for LBP nerve ablation next week and is anxious about her having to have that done.  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) 8)  Medial Epicondylitis, Left  (ICD-726.31) 9)  Diverticulosis of Colon  (ICD-562.10) 10)  Colitis  (ICD-558.9) 11)  Anxiety Depression  (ICD-300.4) 12)  Obesity  (ICD-278.00) 13)  Eczema  (ICD-692.9) 14)  Bronchitis, Chronic  (ICD-491.9) 15)  Leg Pain, Bilateral  (ICD-729.5) 16)  Arteriovenous Malformation, Gastric  (ICD-747.60) 17)  Pain, Chronic Nec (ON ELAVIL)  (ICD-338.29) 18)  Insomnia, Chronic  (ICD-307.42) 19)  Fatty Liver Disease  (ICD-571.8) 20)  Low Back Pain, Chronic ( PAIN [facility])  (ICD-724.2) 21)  COPD  (ICD-496) 22)  Hyperlipidemia  (ICD-272.4) 23)  Coronary Artery Disease  (ICD-414.00) 24)   Osteoporosis  (ICD-733.00) 25)  Diabetes Mellitus, Type II  (ICD-250.00) 26)  Anemia-iron Deficiency  (ICD-280.9) 27)  Osteoarthritis  (ICD-715.90) 28)  Rheumatoid Arthritis  (ICD-714.0) 29)  Hypertension  (ICD-401.9)  Medications Prior to Update: 1)  Ramipril 10 Mg Caps (Ramipril) .... One Tab By Mouth At Night 2)  Alprazolam 0.5 Mg Tabs (Alprazolam) .... One Tab By Mouth At Night As Needed For Anxiety 3)  Robitussin Chest Congestion 100 Mg/80ml Syrp (Guaifenesin) .... As Needed 4)  Saline Mist 0.65 % Soln (Saline) .... As Needed 5)  Amiloride-Hydrochlorothiazide 5-50 Mg Tabs (Amiloride-Hydrochlorothiazide) .... Take One By Mouth Two Times A Day As Needed 6)  Oxycodone-Acetaminophen 10-325 Mg Tabs (Oxycodone-Acetaminophen) .... Take One By Mouth Every 6 Hours As Needed 7)  Pravachol 20 Mg Tabs (Pravastatin Sodium) .... One Tab By Mouth At Night  Allergies: 1)  ! Naprosyn 2)  ! Cipro 3)  ! Septra 4)  ! Iron 5)  ! Morphine Sulfate (Morphine Sulfate)  Physical Exam  General:  Well-developed,well-nourished,in no acute distress; alert,appropriate and cooperative throughout examination, nontoxic and looks well but tired and mildly unhappy.. Head:  Normocephalic and atraumatic without obvious abnormalities. No apparent alopecia or balding. Sinuses NT. Eyes:  Conjunctiva clear bilaterally.  Ears:  R ear normal and L ear normal.   Nose:  External nasal examination shows no deformity or inflammation. Nasal mucosa  are pink and moist without lesions or exudates. Mouth:  no erythema and no exudates.   Neck:  supple, no masses, and no cervical lymphadenopathy.   Chest Wall:  No deformities, masses, or tenderness noted. Lungs:  Normal respiratory effort, chest expands symmetrically. Lungs with crackles, fine rales in the right lung base but no  wheezes. Heart:  Normal rate and regular rhythm. S1 and S2 normal without gallop, murmur, click, rub or other extra sounds. Abdomen:  No suprapubic  tenderness.   Impression & Recommendations:  Problem # 1:  BRONCHITIS- ACUTE (ICD-466.0) Assessment New  Vs early pneumonia. Will not Xray today but may need oine in the future. Trial of Zithromax. See instructions. U/A nml except sm amt RBCs. Her updated medication list for this problem includes:    Robitussin Chest Congestion 100 Mg/28ml Syrp (Guaifenesin) .[name]... As needed    Zithromax Z-pak 250 Mg Tabs (Azithromycin) .[name]... As dir  Take antibiotics and other medications as directed. Encouraged to push clear liquids, get enough rest, and take acetaminophen as needed. To be seen in 5-7 days if no improvement, sooner if worse.  Orders: UA Dipstick W/ Micro (manual) ([address])  Problem # 2:  UTI (ICD-599.0) Assessment: Improved Appears resolved. Her updated medication list for this problem includes:    Zithromax Z-pak 250 Mg Tabs (Azithromycin) .[name]... As dir  Problem # 3:  FATIGUE (ICD-780.79) Assessment: Unchanged Presumed due to bronchitis/early pneumonia. U/A nml. Orders: UA Dipstick W/ Micro (manual) (60454)  Complete Medication List: 1)  Ramipril 10 Mg Caps (Ramipril) .... One tab by mouth at night 2)  Alprazolam 0.5 Mg Tabs (Alprazolam) .... One tab by mouth at night as needed for anxiety 3)  Robitussin Chest Congestion 100 Mg/29ml Syrp (Guaifenesin) .... As needed 4)  Saline Mist 0.65 % Soln (Saline) .... As needed 5)  Amiloride-hydrochlorothiazide 5-50 Mg Tabs (Amiloride-hydrochlorothiazide) .... Take one by mouth two times a day as needed 6)  Oxycodone-acetaminophen 10-325 Mg Tabs (Oxycodone-acetaminophen) .... Take one by mouth every 6 hours as needed 7)  Pravachol 20 Mg Tabs (Pravastatin sodium) .... One tab by mouth at night 8)  Geritol Tonic Liqd (Iron-vitamins) .... Take one teaspoon daily 9)  Vitamin D 400 Unit Caps (Cholecalciferol) .... Take one by mouth daily 10)  Potassium 99 Mg Tabs (Potassium) .... Take one by mouth daily 11)  Vitamin B-12 500 Mcg Tabs  (Cyanocobalamin) .... Take one by mouth daily 12)  Zithromax Z-pak 250 Mg Tabs (Azithromycin) .... As dir  Patient Instructions: 1)  RTC one week 2)  Start Zithromax 3)  Start Take Guaifenesin by going to [facility] or [facility] and getting MUCOUS RELIEF EXPECTORANT (400mg ), take 11/2 tabs by mouth AM and NOON. 4)  Drink lots of fluids anytime taking Guaifenesin.  5)  Start Co Q 10 200mg  a day. 6)  Recheck U/A for RBCs next time. Prescriptions: ZITHROMAX Z-PAK 250 MG TABS (AZITHROMYCIN) as dir  #1 pak x 0   Entered and Authorized by:   Shaune Leeks MD   Signed by:   Shaune Leeks MD on 03/09/2010   Method used:   Electronically to        [facility] [address]* (retail)       [address]       [place], [place]  [address]       Ph: [phone]       Fax: [phone]   RxID:   [id number]    Orders Added:  1)  Est. Patient Level III [74259] 2)  UA Dipstick W/ Micro (manual) [81000]     Orders Added: 1)  Est. Patient Level III [56387] 2)  UA Dipstick W/ Micro (manual) [81000]   Current Allergies (reviewed today): ! NAPROSYN ! CIPRO ! SEPTRA ! IRON ! MORPHINE SULFATE (MORPHINE SULFATE)  Laboratory Results   Urine Tests  Date/Time Received: March 09, 2010 10:34 AM  Date/Time Reported: March 09, 2010 10:34 AM   Routine Urinalysis   Color: yellow Appearance: Clear Glucose: negative   (Normal Range: Negative) Bilirubin: negative   (Normal Range: Negative) Ketone: negative   (Normal Range: Negative) Spec. Gravity: 1.025   (Normal Range: 1.003-1.035) Blood: negative   (Normal Range: Negative) pH: 6.0   (Normal Range: 5.0-8.0) Protein: 30   (Normal Range: Negative) Urobilinogen: 0.2   (Normal Range: 0-1) Nitrite: negative   (Normal Range: Negative) Leukocyte Esterace: trace   (Normal Range: Negative)        Appended Document: F/U NOT FEELING BETTER/CLE U/A micro 0-1 WBCs, 2-4 RBCs 0Bact, rare Epis.

## 2010-05-02 NOTE — Assessment & Plan Note (Signed)
Summary: 1 MONTH FOLLOW UP/[facility]   Vital Signs:  Patient profile:   77 year old female Weight:      149.75 pounds BMI:     28.40 Temp:     98.3 degrees F oral Pulse rate:   84 / minute Pulse rhythm:   regular BP sitting:   136 / 86  (left arm) Cuff size:   regular  Vitals Entered By: Linde Gillis CMA Duncan Dull) (April 05, 2009 10:13 AM) CC: one month follow up   History of Present Illness: [name] is a 77 y/o female presenting today for a one month F/U on anxiety.  She was here one month ago with difficulty sleeping due to anxiety.  She was prescribed Alprazolam which has helped her relax to sleep through the night.  She is still in pain from her back surgeries and only takes Endocet when the pain gets particularly bad.  During her last hospital admission for the back surgery she developed a C. Diff infection and it has made her wary of further surgeries.  She described struggling with aging and mentioned feeling depressed at some times.  Problems Prior to Update: 1)  Pain in Soft Tiss of Bilat Arms, Fleeting  (ICD-729.5) 2)  Upper Respiratory Infection, Acute  (ICD-465.9) 3)  Fatigue  (ICD-780.79) 4)  Diverticulosis of Colon  (ICD-562.10) 5)  Colitis  (ICD-558.9) 6)  Anxiety Depression  (ICD-300.4) 7)  Obesity  (ICD-278.00) 8)  Hypokalemia  (ICD-276.8) 9)  Eczema  (ICD-692.9) 10)  Bronchitis, Chronic  (ICD-491.9) 11)  Leg Pain  (ICD-729.5) 12)  Leg Pain, Bilateral  (ICD-729.5) 13)  Arteriovenous Malformation, Gastric  (ICD-747.60) 14)  Uti  (ICD-599.0) 15)  Pain, Chronic Nec (ON ELAVIL)  (ICD-338.29) 16)  Insomnia, Chronic  (ICD-307.42) 17)  Fatty Liver Disease  (ICD-571.8) 18)  Low Back Pain, Chronic ( PAIN [facility])  (ICD-724.2) 19)  COPD  (ICD-496) 20)  Hyperlipidemia  (ICD-272.4) 21)  Coronary Artery Disease  (ICD-414.00) 22)  Osteoporosis  (ICD-733.00) 23)  Diabetes Mellitus, Type II  (ICD-250.00) 24)  Anemia-iron Deficiency  (ICD-280.9) 25)  Osteoarthritis   (ICD-715.90) 26)  Rheumatoid Arthritis  (ICD-714.0) 27)  Hypertension  (ICD-401.9)  Medications Prior to Update: 1)  Endocet 10-325 Mg Tabs (Oxycodone-Acetaminophen) .[name].. 1 Every 4 Hours As Needed Pain 2)  Ramipril 10 Mg Caps (Ramipril) .... One Tab By Mouth At Night 3)  Actos 15 Mg Tabs (Pioglitazone Hcl) .[name].. 1 Daily By Mouth 4)  Vitamin D 1000 Unit Tabs (Cholecalciferol) .... Take One By Mouth Daily 5)  Sertraline Hcl 50 Mg Tabs (Sertraline Hcl) .... One Tab By Mouth Qd 6)  Alprazolam 0.5 Mg Tabs (Alprazolam) .... One Tab By Mouth At Night As Needed For Anxiety  Allergies: 1)  ! Naprosyn 2)  ! Penicillin 3)  ! Cipro 4)  ! Septra 5)  ! Iron  Physical Exam  General:  Well-developed,well-nourished,in no acute distress; alert,appropriate and cooperative throughout examination. Head:  Non tender to palpation over sinuses. Eyes:  Conjunctiva clear bilaterally.  Ears:  External ear exam shows no significant lesions or deformities.  Otoscopic examination reveals clear canals, tympanic membranes are intact bilaterally without bulging, retraction, inflammation or discharge. Hearing is grossly normal bilaterally. Nose:  External nasal examination shows no deformity or inflammation. Nasal mucosa are pink and moist without lesions or exudates. Mouth:  Oral mucosa and oropharynx without lesions or exudates.  Teeth in good repair. Slight mouth breathing. Neck:  No deformities, masses, or  tenderness noted. Lungs:  Normal respiratory effort, chest expands symmetrically. Lungs are clear to auscultation, no crackles or wheezes. Heart:  Normal rate and regular rhythm. S1 and S2 normal without gallop, murmur, click, rub or other extra sounds. Abdomen:  mild suprapubic tenderness. Msk:  no CVAT.   Impression & Recommendations:  Problem # 1:  ANXIETY DEPRESSION (ICD-300.4) Assessment Improved Is sleeping when taking Xanax. Will continue witrh 1/2-1 pill at night.  Problem # 2:  PAIN, CHRONIC NEC  (ON ELAVIL) (ICD-338.29) Cont with pain Mgt...to be seen middle of next month. Cont curr meds.  Problem # 3:  HYPERTENSION (ICD-401.9) Assessment: Unchanged  Her updated medication list for this problem includes:    Ramipril 10 Mg Caps (Ramipril) ..... One tab by mouth at night  BP today: 136/86 Prior BP: 110/74 (03/02/2009)  Labs Reviewed: K+: 3.7 (09/24/2008) Creat: : 0.7 (09/24/2008)     Complete Medication List: 1)  Endocet 10-325 Mg Tabs (Oxycodone-acetaminophen) .[name].. 1 every 4 hours as needed pain 2)  Ramipril 10 Mg Caps (Ramipril) .... One tab by mouth at night 3)  Actos 15 Mg Tabs (Pioglitazone hcl) .[name].. 1 daily by mouth 4)  Vitamin D 1000 Unit Tabs (Cholecalciferol) .... Take one by mouth daily 5)  Sertraline Hcl 50 Mg Tabs (Sertraline hcl) .... One tab by mouth qd 6)  Alprazolam 0.5 Mg Tabs (Alprazolam) .... One tab by mouth at night as needed for anxiety  Patient Instructions: 1)  RTC one month, recheck anxiety and sleep. Prescriptions: ALPRAZOLAM 0.5 MG TABS (ALPRAZOLAM) one tab by mouth at night as needed for anxiety  #60 x 0   Entered and Authorized by:   Shaune Leeks MD   Signed by:   Shaune Leeks MD on 04/05/2009   Method used:   Print then Give to Patient   RxID:   [id number]   Current Allergies (reviewed today): ! NAPROSYN ! PENICILLIN ! CIPRO ! SEPTRA ! IRON

## 2010-05-02 NOTE — Assessment & Plan Note (Signed)
Summary: COUGH,CHILLS/CLE   Vital Signs:  Patient profile:   77 year old female Weight:      150 pounds O2 Sat:      96 % on Room air Temp:     98.2 degrees F oral Pulse rate:   76 / minute Pulse rhythm:   regular Resp:     20 per minute BP sitting:   104 / 64  (left arm) Cuff size:   regular  Vitals Entered By: Sydell Axon LPN (June 28, 2009 12:14 PM)  O2 Flow:  Room air CC: Productive cough and chills   History of Present Illness: Pt here for congestion. She was seen approx a week ago for sinusitis and was given Amox 500mg  2 two times a day. She had been at her mother's feeling poorly with fever and chills, coughing and diarrhea. The diarrhea started while on Amox. She has a very sensitive stomach and bowel. She is also swimmy-headed and slightly nauseated. She is able to eat and has gained weight lately. She now is slightly constipated. Head feels full.   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) 4)  Medial Epicondylitis, Left  (ICD-726.31) 5)  Diverticulosis of Colon  (ICD-562.10) 6)  Colitis  (ICD-558.9) 7)  Anxiety Depression  (ICD-300.4) 8)  Obesity  (ICD-278.00) 9)  Eczema  (ICD-692.9) 10)  Bronchitis, Chronic  (ICD-491.9) 11)  Leg Pain, Bilateral  (ICD-729.5) 12)  Arteriovenous Malformation, Gastric  (ICD-747.60) 13)  Pain, Chronic Nec (ON ELAVIL)  (ICD-338.29) 14)  Insomnia, Chronic  (ICD-307.42) 15)  Fatty Liver Disease  (ICD-571.8) 16)  Low Back Pain, Chronic ( PAIN [facility])  (ICD-724.2) 17)  COPD  (ICD-496) 18)  Hyperlipidemia  (ICD-272.4) 19)  Coronary Artery Disease  (ICD-414.00) 20)  Osteoporosis  (ICD-733.00) 21)  Diabetes Mellitus, Type II  (ICD-250.00) 22)  Anemia-iron Deficiency  (ICD-280.9) 23)  Osteoarthritis  (ICD-715.90) 24)  Rheumatoid Arthritis  (ICD-714.0) 25)  Hypertension   (ICD-401.9)  Medications Prior to Update: 1)  Endocet 10-325 Mg Tabs (Oxycodone-Acetaminophen) .[name].. 1 Every 4 Hours As Needed Pain 2)  Ramipril 10 Mg Caps (Ramipril) .... One Tab By Mouth At Night 3)  Actos 15 Mg Tabs (Pioglitazone Hcl) .[name].. 1 Daily By Mouth 4)  Vitamin D 1000 Unit Tabs (Cholecalciferol) .... Take One By Mouth Daily 5)  Alprazolam 0.5 Mg Tabs (Alprazolam) .... One Tab By Mouth At Night As Needed For Anxiety 6)  Amoxicillin 500 Mg Tabs (Amoxicillin) .... 2 Tabs By Mouth Two Times A Day For Sinus Infection  Allergies: 1)  ! Naprosyn 2)  ! Cipro 3)  ! Septra 4)  ! Iron  Physical Exam  General:  Well-developed,well-nourished,in no acute distress; alert,appropriate and cooperative throughout examination, interactive but under cover when seen. Head:  Normocephalic and atraumatic without obvious abnormalities. No apparent alopecia or balding. Sinuses NT. Eyes:  Conjunctiva clear bilaterally.  Ears:  R ear normal and L ear normal.   Nose:  External nasal examination shows no deformity or inflammation. Nasal mucosa are pink and moist without lesions or exudates. Mouth:  no erythema and no exudates.   Neck:  supple, no masses, and no cervical lymphadenopathy.   Lungs:  Mild rales with ronchi in LLL, very mild ronchi in RLL. Heart:  Normal rate and regular rhythm. S1 and S2 normal without gallop, murmur, click,  rub or other extra sounds. Abdomen:  Bowel sounds positive,abdomen soft and minimally tender epigastrically  without masses, organomegaly or hernias noted.   Impression & Recommendations:  Problem # 1:  BRONCHITIS- ACUTE (ICD-466.0) Assessment New See instructions. The following medications were removed from the medication list:    Amoxicillin 500 Mg Tabs (Amoxicillin) .[name]... 2 tabs by mouth two times a day for sinus infection Her updated medication list for this problem includes:    Robitussin Chest Congestion 100 Mg/29ml Syrp (Guaifenesin) .[name]... As needed     Zithromax Z-pak 250 Mg Tabs (Azithromycin) .[name]... As dir  Problem # 2:  COLITIS (ICD-558.9) Assessment: Deteriorated  Avoid Amox. Discussed otherr approach. Also avoid NSAIDs. Keep on top of fluids. Cont fiber and Prtobiotic Her updated medication list for this problem includes:    Vear Clock Colon Health Caps (Probiotic product) .[name]... Take one by mouth daily  Discussed use of medication and role of diet. Encouraged clear liquids and electrolyte replacement fluids. Instructed to call if any signs of worsening dehydration.   Complete Medication List: 1)  Endocet 10-325 Mg Tabs (Oxycodone-acetaminophen) .[name].. 1 every 4 hours as needed pain 2)  Ramipril 10 Mg Caps (Ramipril) .... One tab by mouth at night 3)  Actos 15 Mg Tabs (Pioglitazone hcl) .[name].. 1 daily by mouth 4)  Vitamin D 1000 Unit Tabs (Cholecalciferol) .... Take one by mouth daily 5)  Alprazolam 0.5 Mg Tabs (Alprazolam) .... One tab by mouth at night as needed for anxiety 6)  Robitussin Chest Congestion 100 Mg/20ml Syrp (Guaifenesin) .... As needed 7)  Saline Mist 0.65 % Soln (Saline) .... As needed 8)  Phillips Colon Health Caps (Probiotic product) .... Take one by mouth daily 9)  Zithromax Z-pak 250 Mg Tabs (Azithromycin) .... As dir  Patient Instructions: 1)  Take Zithromax 2)  Take Robitussin 2 Tbsp AM and Noon 3)  Take Tyl ES 2 tabs three times a day  4)  Gargle every half hour 5)  Keep lozenge in mouth  6)  Call in 10  days for me (M-Thu) if not better Prescriptions: ZITHROMAX Z-PAK 250 MG TABS (AZITHROMYCIN) as dir  #1 pak x 0   Entered and Authorized by:   Shaune Leeks MD   Signed by:   Shaune Leeks MD on 06/28/2009   Method used:   Electronically to        [facility] [address] (retail)       [address]       [place], [place]  [address]       Ph: [phone]       Fax: [phone]   RxID:   [id number]   Current Allergies (reviewed today): ! NAPROSYN ! CIPRO ! SEPTRA !  IRON

## 2010-05-02 NOTE — Progress Notes (Signed)
----   Converted from flag ---- ---- 01/05/2010 9:23 AM, Sydell Axon LPN wrote:   ---- 01/05/2010 9:17 AM, Sydell Axon LPN wrote: Patient has an appointment to see you on 01/19/10. [place]  ---- 01/04/2010 11:57 AM, Sydell Axon LPN wrote: Left message on machine for patient to call back. Sydell Axon, LPN   ---- 95/62/1308 8:20 AM, Shaune Leeks MD wrote:   ---- 01/04/2010 8:20 AM, Shaune Leeks MD wrote:   ---- 01/04/2010 8:20 AM, Shaune Leeks MD wrote: Please have pt come in to be seen to start chol meds.  ---- 09/27/2009 4:54 PM, Shaune Leeks MD wrote: Pt needs chol medication. ------------------------------

## 2010-05-02 NOTE — Miscellaneous (Signed)
Summary: Consent to Lesion removal right leg  Consent to Lesion removal right leg   Imported By: Beau Fanny 05/23/2009 14:32:38  _____________________________________________________________________  External Attachment:    Type:   Image     Comment:   External Document

## 2010-05-02 NOTE — Assessment & Plan Note (Signed)
Summary: ROA  CYD   Vital Signs:  Patient profile:   77 year old female Weight:      149.75 pounds Temp:     98.4 degrees F oral Pulse rate:   84 / minute Pulse rhythm:   regular BP sitting:   110 / 70  (left arm) Cuff size:   regular  Vitals Entered By: Sydell Axon LPN (May 11, 2009 11:27 AM) CC: follow-up visit   History of Present Illness: Pt here for followup, looks better than she has in some time. Feels better but having significant back pain. This is monitored and treated by Dr Pernell Dupre, [facility]. She wants to ask about a nerve stimulator and asked my opinion. Shwe is having significant left arm pain , located mainly around the elbow and distal thereto. She is doing no unusual work or exercise. She has had no recent falls or trauma. She also has a mole in the popliteal fossa of the right leg distally that she would like burned off. She had been seen here early Dec for anxiety and put on Zoloft and as needed Xanax which she has often taken at night. She thinks the Zoloft is causing increased appetite and wants to stop it. She tires to take the Xanax sparingly.  Problems Prior to Update: 1)  Pain in Soft Tiss of Bilat Arms, Fleeting  (ICD-729.5) 2)  Upper Respiratory Infection, Acute  (ICD-465.9) 3)  Fatigue  (ICD-780.79) 4)  Diverticulosis of Colon  (ICD-562.10) 5)  Colitis  (ICD-558.9) 6)  Anxiety Depression  (ICD-300.4) 7)  Obesity  (ICD-278.00) 8)  Hypokalemia  (ICD-276.8) 9)  Eczema  (ICD-692.9) 10)  Bronchitis, Chronic  (ICD-491.9) 11)  Leg Pain  (ICD-729.5) 12)  Leg Pain, Bilateral  (ICD-729.5) 13)  Arteriovenous Malformation, Gastric  (ICD-747.60) 14)  Pain, Chronic Nec (ON ELAVIL)  (ICD-338.29) 15)  Insomnia, Chronic  (ICD-307.42) 16)  Fatty Liver Disease  (ICD-571.8) 17)  Low Back Pain, Chronic ( PAIN [facility])  (ICD-724.2) 18)  COPD  (ICD-496) 19)  Hyperlipidemia  (ICD-272.4) 20)  Coronary Artery Disease  (ICD-414.00) 21)  Osteoporosis   (ICD-733.00) 22)  Diabetes Mellitus, Type II  (ICD-250.00) 23)  Anemia-iron Deficiency  (ICD-280.9) 24)  Osteoarthritis  (ICD-715.90) 25)  Rheumatoid Arthritis  (ICD-714.0) 26)  Hypertension  (ICD-401.9)  Medications Prior to Update: 1)  Endocet 10-325 Mg Tabs (Oxycodone-Acetaminophen) .[name].. 1 Every 4 Hours As Needed Pain 2)  Ramipril 10 Mg Caps (Ramipril) .... One Tab By Mouth At Night 3)  Actos 15 Mg Tabs (Pioglitazone Hcl) .[name].. 1 Daily By Mouth 4)  Vitamin D 1000 Unit Tabs (Cholecalciferol) .... Take One By Mouth Daily 5)  Sertraline Hcl 50 Mg Tabs (Sertraline Hcl) .... One Tab By Mouth Qd 6)  Alprazolam 0.5 Mg Tabs (Alprazolam) .... One Tab By Mouth At Night As Needed For Anxiety  Allergies: 1)  ! Naprosyn 2)  ! Penicillin 3)  ! Cipro 4)  ! Septra 5)  ! Iron  Physical Exam  General:  Well-developed,well-nourished,in no acute distress; alert,appropriate and cooperative throughout examination. Looks good and more calm than I have seen her in quite a while. Head:  Normocephalic and atraumatic without obvious abnormalities. No apparent alopecia or balding. Sinuses NT.  Eyes:  Conjunctiva clear bilaterally.  Ears:  External ear exam shows no significant lesions or deformities.  Otoscopic examination reveals clear canals, tympanic membranes are intact bilaterally without bulging, retraction, inflammation or discharge. Hearing is grossly normal bilaterally. Nose:  External  nasal examination shows no deformity or inflammation. Nasal mucosa are pink and moist without lesions or exudates. Mouth:  Oral mucosa and oropharynx without lesions or exudates.  Teeth in good repair. Slight mouth breathing. Neck:  No deformities, masses, or tenderness noted. Lungs:  Normal respiratory effort, chest expands symmetrically. Lungs have ronchi bilat, no wheezing. Has wet cough. Heart:  Normal rate and regular rhythm. S1 and S2 normal without gallop, murmur, click, rub or other extra sounds. Abdomen:  mild  suprapubic tenderness. Msk:  Continued chronic mild decreased ROM of lower back in lumbar distrib. Extremities:  No clubbing, cyanosis, edema, or deformity noted with normal full range of motion of all joints.   Neurologic:  No cranial nerve deficits noted. Station and gait are normal.  Sensory, motor and coordinative functions appear intact. Skin:  Benign verrrucal wart, approx 5mm in distal right popliteal fossa. Psych:  Cognition and judgment appear intact. Alert and cooperative with normal attention span and concentration. No apparent delusions, illusions, hallucinations, good objectivity, good eye contact. In good spirits, smiling regularly.   Impression & Recommendations:  Problem # 1:  ANXIETY DEPRESSION (ICD-300.4) Assessment Improved Looks and siounds good today. May taper off Zoloft as discussed. Cont sparing use of Xanax.  Problem # 2:  MEDIAL EPICONDYLITIS, LEFT (ICD-726.31) Assessment: New Discussed pain meds she has and elbow strap.  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 Pt does not want specimen sent off.  Problem # 4:  HYPERTENSION (ICD-401.9) Assessment: Unchanged Good control. Her updated medication list for this problem includes:    Ramipril 10 Mg Caps (Ramipril) ..... One tab by mouth at night  BP today: 110/70 Prior BP: 136/86 (04/05/2009)  Labs Reviewed: K+: 3.7 (09/24/2008) Creat: : 0.7 (09/24/2008)     Problem # 5:  BRONCHITIS- ACUTE (ICD-466.0) Assessment: New See instructions. Her updated medication list for this problem includes:    Zithromax Z-pak 250 Mg Tabs (Azithromycin) .[name]... As dir  Complete Medication List: 1)  Endocet 10-325 Mg Tabs (Oxycodone-acetaminophen) .[name].. 1 every 4 hours as needed pain 2)  Ramipril 10 Mg Caps (Ramipril) .... One tab by mouth at night 3)  Actos 15 Mg Tabs (Pioglitazone hcl) .[name].. 1 daily by mouth 4)  Vitamin D 1000 Unit Tabs  (Cholecalciferol) .... Take one by mouth daily 5)  Sertraline Hcl 50 Mg Tabs (Sertraline hcl) .... One tab by mouth daily 6)  Alprazolam 0.5 Mg Tabs (Alprazolam) .... One tab by mouth at night as needed for anxiety 7)  Zithromax Z-pak 250 Mg Tabs (Azithromycin) .... As dir  Patient Instructions: 1)  RTC 15 min appt, skin lesion removal. 2)  Take Zithromax as directed. 3)  Take Guaifenesin by going to [facility] or [facility] and getting MUCOUS RELIEF EXPECTORANT (400mg ), take 11/2 tabs by mouth AM and NOON. 4)  Drink lots of fluids anytime taking Guaifenesin.  Prescriptions: ZITHROMAX Z-PAK 250 MG TABS (AZITHROMYCIN) as dir  #1 pak x 0   Entered and Authorized by:   Shaune Leeks MD   Signed by:   Shaune Leeks MD on 05/11/2009   Method used:   Electronically to        [facility] [address] (retail)       [address]       [place], [place]  [address]       Ph: [phone]  Fax: [phone]   RxID:   [id number]   Current Allergies (reviewed today): ! NAPROSYN ! PENICILLIN ! CIPRO ! SEPTRA ! IRON

## 2010-05-02 NOTE — Letter (Signed)
Summary: Dr.Mark Roy,[facility],Note  Dr.Mark Roy,[facility],Note   Imported By: Beau Fanny 04/06/2009 15:12:34  _____________________________________________________________________  External Attachment:    Type:   Image     Comment:   External Document

## 2010-05-02 NOTE — Assessment & Plan Note (Signed)
Summary: FOLLOW UP   Vital Signs:  Patient profile:   77 year old female Weight:      150 pounds Temp:     98.0 degrees F oral Pulse rate:   80 / minute Pulse rhythm:   regular BP sitting:   124 / 78  (left arm) Cuff size:   regular  Vitals Entered By: Sydell Axon LPN (September 26, 2009 10:46 AM) CC: Follow-up, needs a refill on Alprazolam   History of Present Illness: Pt here for followup. She had implant for stimulator of thje back. She tolerated her surgery fine. She is to  have hetr stitches out today. She had some left arm radiculopathy postoperatively. She has tolerated things ok.  She needs Xanax prescription to be filled.  She has had cough abds congestion from being in the air conditioning. She feels well but slightly weak from the anesthesia, getting better daily.  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) 7)  Medial Epicondylitis, Left  (ICD-726.31) 8)  Diverticulosis of Colon  (ICD-562.10) 9)  Colitis  (ICD-558.9) 10)  Anxiety Depression  (ICD-300.4) 11)  Obesity  (ICD-278.00) 12)  Eczema  (ICD-692.9) 13)  Bronchitis, Chronic  (ICD-491.9) 14)  Leg Pain, Bilateral  (ICD-729.5) 15)  Arteriovenous Malformation, Gastric  (ICD-747.60) 16)  Pain, Chronic Nec (ON ELAVIL)  (ICD-338.29) 17)  Insomnia, Chronic  (ICD-307.42) 18)  Fatty Liver Disease  (ICD-571.8) 19)  Low Back Pain, Chronic ( PAIN [facility])  (ICD-724.2) 20)  COPD  (ICD-496) 21)  Hyperlipidemia  (ICD-272.4) 22)  Coronary Artery Disease  (ICD-414.00) 23)  Osteoporosis  (ICD-733.00) 24)  Diabetes Mellitus, Type II  (ICD-250.00) 25)  Anemia-iron Deficiency  (ICD-280.9) 26)  Osteoarthritis  (ICD-715.90) 27)  Rheumatoid Arthritis  (ICD-714.0) 28)  Hypertension   (ICD-401.9)  Medications Prior to Update: 1)  Ramipril 10 Mg Caps (Ramipril) .... One Tab By Mouth At Night 2)  Actos 15 Mg Tabs (Pioglitazone Hcl) .[name].. 1 Daily By Mouth 3)  Alprazolam 0.5 Mg Tabs (Alprazolam) .... One Tab By Mouth At Night As Needed For Anxiety 4)  Robitussin Chest Congestion 100 Mg/70ml Syrp (Guaifenesin) .... As Needed 5)  Saline Mist 0.65 % Soln (Saline) .... As Needed 6)  Vear Clock Colon Health  Caps (Probiotic Product) .... Take One By Mouth Daily 7)  Amiloride-Hydrochlorothiazide 5-50 Mg Tabs (Amiloride-Hydrochlorothiazide) .... Take One By Mouth Two Times A Day As Needed 8)  Endocet 5-325 Mg Tabs (Oxycodone-Acetaminophen) .... Take 1-2 By Mouth Every 8 Hours As Needed 9)  Macrobid 100 Mg Caps (Nitrofurantoin Monohyd Macro) .... One Tab By Mouth Two Times A Day  Allergies: 1)  ! Naprosyn 2)  ! Cipro 3)  ! Septra 4)  ! Iron 5)  ! Morphine Sulfate (Morphine Sulfate)  Physical Exam  General:  Well-developed,well-nourished,in no acute distress; alert,appropriate and cooperative throughout examination, nontoxic and looks well.. Head:  Normocephalic and atraumatic without obvious abnormalities. No apparent alopecia or balding. Sinuses NT. Eyes:  Conjunctiva clear bilaterally.  Ears:  R ear normal and L ear normal.   Nose:  External nasal examination shows no deformity or inflammation. Nasal mucosa are pink and moist without lesions or exudates. Mouth:  no erythema and no exudates.   Neck:  supple, no masses, and no cervical lymphadenopathy.   Lungs:  Ronchi which improves with cough. Heart:  Normal rate and regular rhythm. S1 and S2 normal without gallop, murmur, click, rub or other extra sounds.   Impression & Recommendations:  Problem # 1:  URI (ICD-465.9) Assessment New Cont deep breathing and coughing to bring things up. Her updated medication list for this problem includes:    Robitussin Chest Congestion 100 Mg/72ml Syrp (Guaifenesin) .[name]... As  needed  Instructed on symptomatic treatment. Call if symptoms persist or worsen.   Problem # 2:  UTI (ICD-599.0) Assessment: Improved Resolved. The following medications were removed from the medication list:    Macrobid 100 Mg Caps (Nitrofurantoin monohyd macro) ..... One tab by mouth two times a day  Problem # 3:  ANXIETY DEPRESSION (ICD-300.4) Assessment: Unchanged Stable. Script written for Xanax. Use sparingly.  Problem # 4:  PAIN, CHRONIC NEC (ON ELAVIL) (ICD-338.29) Assessment: Unchanged Back stimulator healing.  Problem # 5:  HYPERTENSION (ICD-401.9) Assessment: Unchanged Stable. Cont Amiloride. Script written. Nedds Bmet. Her updated medication list for this problem includes:    Ramipril 10 Mg Caps (Ramipril) ..... One tab by mouth at night    Amiloride-hydrochlorothiazide 5-50 Mg Tabs (Amiloride-hydrochlorothiazide) .[name]... Take one by mouth two times a day as needed  BP today: 124/78 Prior BP: 104/60 (09/01/2009)  Labs Reviewed: K+: 3.7 (09/24/2008) Creat: : 0.7 (09/24/2008)     Complete Medication List: 1)  Ramipril 10 Mg Caps (Ramipril) .... One tab by mouth at night 2)  Actos 15 Mg Tabs (Pioglitazone hcl) .[name].. 1 daily by mouth 3)  Alprazolam 0.5 Mg Tabs (Alprazolam) .... One tab by mouth at night as needed for anxiety 4)  Robitussin Chest Congestion 100 Mg/4ml Syrp (Guaifenesin) .... As needed 5)  Saline Mist 0.65 % Soln (Saline) .... As needed 6)  Amiloride-hydrochlorothiazide 5-50 Mg Tabs (Amiloride-hydrochlorothiazide) .... Take one by mouth two times a day as needed 7)  Endocet 5-325 Mg Tabs (Oxycodone-acetaminophen) .... Take 1-2 by mouth every 8 hours as needed 8)  Cyclobenzaprine Hcl 10 Mg Tabs (Cyclobenzaprine hcl) .... Take one by mouth every 8 hours as needed 9)  Oxycodone-acetaminophen 10-325 Mg Tabs (Oxycodone-acetaminophen) .... Take one by mouth every 6 hours as needed  Patient Instructions: 1)  Schedule labs tomm Chol profile v70.0 BMet 401.9   Prescriptions: ALPRAZOLAM 0.5 MG TABS (ALPRAZOLAM) one tab by mouth at night as needed for anxiety  #30 x 5   Entered and Authorized by:   Shaune Leeks MD   Signed by:   Shaune Leeks MD on 09/26/2009   Method used:   Print then Give to Patient   RxID:   [id number] AMILORIDE-HYDROCHLOROTHIAZIDE 5-50 MG TABS (AMILORIDE-HYDROCHLOROTHIAZIDE) Take one by mouth two times a day as needed  #60 x 11   Entered by:   Sydell Axon LPN   Authorized by:   Shaune Leeks MD   Signed by:   Sydell Axon LPN on 37/62/8315   Method used:   Electronically to        [facility] [address] (retail)       [address]       [place], [place]  [address]       Ph: [phone]       Fax: [phone]   RxID:   [id number]   Current Allergies (reviewed today): ! NAPROSYN ! CIPRO ! SEPTRA ! IRON ! MORPHINE SULFATE (MORPHINE SULFATE)

## 2010-05-02 NOTE — Assessment & Plan Note (Signed)
Summary: DISCUSS HER SUGAR / LFW   Vital Signs:  Patient profile:   77 year old female Weight:      148 pounds BMI:     28.07 Temp:     97.9 degrees F oral Pulse rate:   88 / minute Pulse rhythm:   regular BP sitting:   104 / 60  (left arm) Cuff size:   regular  Vitals Entered By: Sydell Axon LPN (January 19, 2010 2:16 PM) CC: Discuss sugar   History of Present Illness: Pt is here for diabetes recheck. She stopped her Actos earlier this year due to information on the TV and has really been restricting her carb intake. She has never eaten much sweets. Her nos are typically in the low 100s. She occas gets up to 130s and down to the 90s. She has felt better, esp with regard to her bladder, since stopping the Actos. We had also done labs on her just before I had left for the summer and she is back to review those as well. She had a back stimulator placed and that has really given her trouble. She would like it removed but has been told that she needs to give it at least one year.  She is wearing a back brace which has helped more than anything else. Her back has been feeling better lalely. Mom is in a NH after a fall and being unable to get up. She is running herself ragged goiung back and forth to there. Mom is doing better. Has been gotten up and is walking, getting more stamina.  Allergies: 1)  ! Naprosyn 2)  ! Cipro 3)  ! Septra 4)  ! Iron 5)  ! Morphine Sulfate (Morphine Sulfate)  Physical Exam  General:  Well-developed,well-nourished,in no acute distress; alert,appropriate and cooperative throughout examination, nontoxic and looks well.. Head:  Normocephalic and atraumatic without obvious abnormalities. No apparent alopecia or balding. Sinuses NT. Eyes:  Conjunctiva clear bilaterally.  Ears:  R ear normal and L ear normal.   Nose:  External nasal examination shows no deformity or inflammation. Nasal mucosa are pink and moist without lesions or exudates. Mouth:  no erythema  and no exudates.   Neck:  supple, no masses, and no cervical lymphadenopathy.   Lungs:  Ronchi which improves with cough. Heart:  Normal rate and regular rhythm. S1 and S2 normal without gallop, murmur, click, rub or other extra sounds.   Impression & Recommendations:  Problem # 1:  HYPERLIPIDEMIA (ICD-272.4) Assessment Unchanged LDL too high for having DM and has had ETT and CATH with evidence of narrowing. Will start low dose Prava. Her updated medication list for this problem includes:    Pravachol 20 Mg Tabs (Pravastatin sodium) ..... One tab by mouth at night  Labs Reviewed: SGOT: 16 (05/03/2008)   SGPT: 13 (05/03/2008)   HDL:28.70 (09/27/2009)  LDL:133 (09/27/2009)  Chol:199 (09/27/2009)  Trig:189.0 (09/27/2009)  Problem # 2:  DIABETES MELLITUS, TYPE II (ICD-250.00) Assessment: Unchanged  Nos sound good. Will check A1C and FS next time. The following medications were removed from the medication list:    Actos 15 Mg Tabs (Pioglitazone hcl) .[name]... 1 daily by mouth Her updated medication list for this problem includes:    Ramipril 10 Mg Caps (Ramipril) ..... One tab by mouth at night  Labs Reviewed: Creat: 1.0 (09/27/2009)   Microalbumin: 2.4 (05/03/2004) Reviewed HgBA1c results: 5.5 (09/24/2008)  5.7 (01/21/2008)  Problem # 3:  ANXIETY DEPRESSION (ICD-300.4) Assessment: Unchanged Discussed Mom's situation...sounds  to be doing well. Will follow.  Problem # 4:  LOW BACK PAIN, CHRONIC ( PAIN [facility]) (ICD-724.2) Assessment: Unchanged Discussed. Suggestshe give the stimulator a better try. The following medications were removed from the medication list:    Endocet 5-325 Mg Tabs (Oxycodone-acetaminophen) .[name]... Take 1-2 by mouth every 8 hours as needed    Cyclobenzaprine Hcl 10 Mg Tabs (Cyclobenzaprine hcl) .[name]... Take one by mouth every 8 hours as needed Her updated medication list for this problem includes:    Oxycodone-acetaminophen 10-325 Mg Tabs  (Oxycodone-acetaminophen) .[name]... Take one by mouth every 6 hours as needed  Complete Medication List: 1)  Ramipril 10 Mg Caps (Ramipril) .... One tab by mouth at night 2)  Alprazolam 0.5 Mg Tabs (Alprazolam) .... One tab by mouth at night as needed for anxiety 3)  Robitussin Chest Congestion 100 Mg/46ml Syrp (Guaifenesin) .... As needed 4)  Saline Mist 0.65 % Soln (Saline) .... As needed 5)  Amiloride-hydrochlorothiazide 5-50 Mg Tabs (Amiloride-hydrochlorothiazide) .... Take one by mouth two times a day as needed 6)  Oxycodone-acetaminophen 10-325 Mg Tabs (Oxycodone-acetaminophen) .... Take one by mouth every 6 hours as needed 7)  Pravachol 20 Mg Tabs (Pravastatin sodium) .... One tab by mouth at night  Other Orders: Flu Vaccine 75yrs + MEDICARE PATIENTS (U2725) Administration Flu vaccine - MCR (D6644)  Patient Instructions: 1)  SGOT, SGPT 272. 4   6 Medero 2)  See me in 3 mos, SGOT, SGPT, CHOL PROFILE  272.4     prior 3)  A1C, FBS 250.00 Prescriptions: PRAVACHOL 20 MG TABS (PRAVASTATIN SODIUM) one tab by mouth at night  #30 x 12   Entered and Authorized by:   Shaune Leeks MD   Signed by:   Shaune Leeks MD on 01/19/2010   Method used:   Electronically to        [facility] [address] (retail)       [address]       [place], [place]  [address]       Ph: [phone]       Fax: [phone]   RxID:   [id number]    Orders Added: 1)  Flu Vaccine 94yrs + MEDICARE PATIENTS [Q2039] 2)  Administration Flu vaccine - MCR [G0008] 3)  Est. Patient Level IV [55732]    Current Allergies (reviewed today): ! NAPROSYN ! CIPRO ! SEPTRA ! IRON ! MORPHINE SULFATE (MORPHINE SULFATE)   Flu Vaccine Consent Questions     Do you have a history of severe allergic reactions to this vaccine? no    Any prior history of allergic reactions to egg and/or gelatin? no    Do you have a sensitivity to the preservative Thimersol? no    Do you have a past history of  Guillan-Barre Syndrome? no    Do you currently have an acute febrile illness? no    Have you ever had a severe reaction to latex? no    Vaccine information given and explained to patient? yes    Are you currently pregnant? no    Lot Number:[id number]   Exp Date:09/30/2010   Site Given  Left Deltoid [id number]

## 2010-05-02 NOTE — Assessment & Plan Note (Signed)
Summary: weak/rbh   Vital Signs:  Patient profile:   77 year old female Weight:      152.25 pounds Temp:     98.3 degrees F oral Pulse rate:   92 / minute Pulse rhythm:   regular BP sitting:   120 / 70  (left arm) Cuff size:   regular  Vitals Entered By: Sydell Axon LPN (July 06, 1608 9:39 AM) CC: Real weak, fever, ? UTI alot of pressure   History of Present Illness: Pt here as acute walkin for weakness. She was seen last week for URI and Gastro and was treated with Zithro  which helped her significantly to where she felt almost back to normal until she then started feeling bad on Sun and has gotten worse since. She is feeling weak and is staggering into things with SOB and difficulty urinating,. She goes a lot but hurts at times with urination , has  pressure with urinating and  lots of pressure with finishing. She has fever at night 101-102 axillary with chills. She took a Zpak starting last Tues and Zithromax should still be in her system.  She has not heard anything from [facility] pain/back doctor  yet. She is frustrated with chronic back pain and unresponsiveness of that provider. She has had a headache in the right temple this AM, no ear pain but has roaring, she has mild runny nose that is clear, no ST, minimal cough in the evening, but  has some SOB. She has  no N/V altho had it last 2 [place] ago, had RLQ abd pain most of nite last nite, now resolved, no diarrhea, last BM yest was nml (had shredded wheat), urine looks ok but has odor.   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) 5)  Medial Epicondylitis, Left  (ICD-726.31) 6)  Diverticulosis of Colon  (ICD-562.10) 7)  Colitis  (ICD-558.9) 8)  Anxiety Depression  (ICD-300.4) 9)  Obesity  (ICD-278.00) 10)  Eczema  (ICD-692.9) 11)  Bronchitis, Chronic  (ICD-491.9) 12)   Leg Pain, Bilateral  (ICD-729.5) 13)  Arteriovenous Malformation, Gastric  (ICD-747.60) 14)  Pain, Chronic Nec (ON ELAVIL)  (ICD-338.29) 15)  Insomnia, Chronic  (ICD-307.42) 16)  Fatty Liver Disease  (ICD-571.8) 17)  Low Back Pain, Chronic ( PAIN [facility])  (ICD-724.2) 18)  COPD  (ICD-496) 19)  Hyperlipidemia  (ICD-272.4) 20)  Coronary Artery Disease  (ICD-414.00) 21)  Osteoporosis  (ICD-733.00) 22)  Diabetes Mellitus, Type II  (ICD-250.00) 23)  Anemia-iron Deficiency  (ICD-280.9) 24)  Osteoarthritis  (ICD-715.90) 25)  Rheumatoid Arthritis  (ICD-714.0) 26)  Hypertension  (ICD-401.9)  Medications Prior to Update: 1)  Endocet 10-325 Mg Tabs (Oxycodone-Acetaminophen) .[name].. 1 Every 4 Hours As Needed Pain 2)  Ramipril 10 Mg Caps (Ramipril) .... One Tab By Mouth At Night 3)  Actos 15 Mg Tabs (Pioglitazone Hcl) .[name].. 1 Daily By Mouth 4)  Vitamin D 1000 Unit Tabs (Cholecalciferol) .... Take One By Mouth Daily 5)  Alprazolam 0.5 Mg Tabs (Alprazolam) .... One Tab By Mouth At Night As Needed For Anxiety 6)  Robitussin Chest Congestion 100 Mg/72ml Syrp (Guaifenesin) .... As Needed 7)  Saline Mist 0.65 % Soln (Saline) .... As Needed 8)  Vear Clock Colon Health  Caps (Probiotic Product) .... Take One By Mouth Daily 9)  Zithromax Z-Pak  250 Mg Tabs (Azithromycin) .... As Dir  Allergies: 1)  ! Naprosyn 2)  ! Cipro 3)  ! Septra 4)  ! Iron  Physical Exam  General:  Well-developed,well-nourished,in no acute distress; alert,appropriate and cooperative throughout examination, interactive but under cover when seen. Head:  Normocephalic and atraumatic without obvious abnormalities. No apparent alopecia or balding. Sinuses NT. Eyes:  Conjunctiva clear bilaterally.  Ears:  R ear normal and L ear normal.   Nose:  External nasal examination shows no deformity or inflammation. Nasal mucosa are pink and moist without lesions or exudates. Mouth:  no erythema and no exudates.   Neck:  supple, no  masses, and no cervical lymphadenopathy.   Chest Wall:  No deformities, masses, or tenderness noted. Lungs:  Normal respiratory effort, chest expands symmetrically. Lungs are clear to auscultation, no crackles or wheezes. Heart:  Normal rate and regular rhythm. S1 and S2 normal without gallop, murmur, click, rub or other extra sounds. Abdomen:  Bowel sounds positive,abdomen soft and somewhat  tender  in the LLQ  without masses, organomegaly or hernias noted. Heel tap, obturator, SLR all neg for abd signs. Diff for her to lay down and get up from the exam table due to lower back pain. Psych:  Cognition and judgment appear intact. Alert and cooperative with normal attention span and concentration. No apparent delusions, illusions, hallucinations, good objectivity, good eye contact. Mild frustration voiced.   Impression & Recommendations:  Problem # 1:  ABDOMINAL PAIN, LEFT LOWER QUADRANT (ICD-789.04) Assessment New Presumably diverticulitis, early. Cannot use Cipro or Bactrim. Will try Augmentin. She stopped Augmentin a few [place] ago due to "Killing all the bacteria in her bowel." Call if does not tolerate. U/A neg.  Complete Medication List: 1)  Endocet 10-325 Mg Tabs (Oxycodone-acetaminophen) .[name].. 1 every 4 hours as needed pain 2)  Ramipril 10 Mg Caps (Ramipril) .... One tab by mouth at night 3)  Actos 15 Mg Tabs (Pioglitazone hcl) .[name].. 1 daily by mouth 4)  Vitamin D 1000 Unit Tabs (Cholecalciferol) .... Take one by mouth daily 5)  Alprazolam 0.5 Mg Tabs (Alprazolam) .... One tab by mouth at night as needed for anxiety 6)  Robitussin Chest Congestion 100 Mg/56ml Syrp (Guaifenesin) .... As needed 7)  Saline Mist 0.65 % Soln (Saline) .... As needed 8)  Phillips Colon Health Caps (Probiotic product) .... Take one by mouth daily 9)  Amoxicillin-pot Clavulanate 500-125 Mg Tabs (Amoxicillin-pot clavulanate) .... One tab by mouth three times a day  Other Orders: UA Dipstick W/ Micro (manual)  (81000)  Patient Instructions: 1)  RTC Mon 2)  35 mins spent w/ pt as walkin, worked in. Prescriptions: AMOXICILLIN-POT CLAVULANATE 500-125 MG TABS (AMOXICILLIN-POT CLAVULANATE) one tab by mouth three times a day  #30 x 0   Entered and Authorized by:   Shaune Leeks MD   Signed by:   Shaune Leeks MD on 07/06/2009   Method used:   Electronically to        [facility] [address] (retail)       [address]       [place], [place]  [address]       Ph: [phone]       Fax: [phone]   RxID:   [id number]   Current Allergies (reviewed today): ! NAPROSYN ! CIPRO ! SEPTRA ! IRON  Laboratory Results   Urine Tests  Date/Time Received: July 06, 2009 9:55 AM  Date/Time Reported: July 06, 2009 9:55  AM   Routine Urinalysis   Color: yellow Appearance: Cloudy Glucose: negative   (Normal Range: Negative) Bilirubin: negative   (Normal Range: Negative) Ketone: negative   (Normal Range: Negative) Spec. Gravity: 1.025   (Normal Range: 1.003-1.035) Blood: trace-lysed   (Normal Range: Negative) pH: 5.0   (Normal Range: 5.0-8.0) Protein: 30   (Normal Range: Negative) Urobilinogen: 0.2   (Normal Range: 0-1) Nitrite: negative   (Normal Range: Negative) Leukocyte Esterace: small   (Normal Range: Negative)  Urine Microscopic WBC/HPF: 0-1 RBC/HPF: 0 Bacteria/HPF: 0 Epithelial/HPF: Rare

## 2010-05-02 NOTE — Assessment & Plan Note (Signed)
Summary: SUGAR IS HIGH/DLO   Vital Signs:  Patient profile:   77 year old female Weight:      149.75 pounds Temp:     97.1 degrees F oral Pulse rate:   92 / minute Pulse rhythm:   regular BP sitting:   114 / 68  (left arm) Cuff size:   regular  Vitals Entered By: Sydell Axon LPN (March 02, 2010 9:44 AM) CC: Sugar has been running high   History of Present Illness: Pt here for her Glu now starting to run high. She had been on Actos previously with great control (A1C 5.5). She has that yucky feeling. She had homemade potato salad a few days ago and her glucose shot up. She has been concerned and has really been careful generally with diet and has been walking more.  She has a general rundown feeling but looks good, just concerned.   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) 7)  Medial Epicondylitis, Left  (ICD-726.31) 8)  Diverticulosis of Colon  (ICD-562.10) 9)  Colitis  (ICD-558.9) 10)  Anxiety Depression  (ICD-300.4) 11)  Obesity  (ICD-278.00) 12)  Eczema  (ICD-692.9) 13)  Bronchitis, Chronic  (ICD-491.9) 14)  Leg Pain, Bilateral  (ICD-729.5) 15)  Arteriovenous Malformation, Gastric  (ICD-747.60) 16)  Pain, Chronic Nec (ON ELAVIL)  (ICD-338.29) 17)  Insomnia, Chronic  (ICD-307.42) 18)  Fatty Liver Disease  (ICD-571.8) 19)  Low Back Pain, Chronic ( PAIN [facility])  (ICD-724.2) 20)  COPD  (ICD-496) 21)  Hyperlipidemia  (ICD-272.4) 22)  Coronary Artery Disease  (ICD-414.00) 23)  Osteoporosis  (ICD-733.00) 24)  Diabetes Mellitus, Type II  (ICD-250.00) 25)  Anemia-iron Deficiency  (ICD-280.9) 26)  Osteoarthritis  (ICD-715.90) 27)  Rheumatoid Arthritis  (ICD-714.0) 28)  Hypertension  (ICD-401.9)  Medications Prior to Update: 1)   Ramipril 10 Mg Caps (Ramipril) .... One Tab By Mouth At Night 2)  Alprazolam 0.5 Mg Tabs (Alprazolam) .... One Tab By Mouth At Night As Needed For Anxiety 3)  Robitussin Chest Congestion 100 Mg/63ml Syrp (Guaifenesin) .... As Needed 4)  Saline Mist 0.65 % Soln (Saline) .... As Needed 5)  Amiloride-Hydrochlorothiazide 5-50 Mg Tabs (Amiloride-Hydrochlorothiazide) .... Take One By Mouth Two Times A Day As Needed 6)  Oxycodone-Acetaminophen 10-325 Mg Tabs (Oxycodone-Acetaminophen) .... Take One By Mouth Every 6 Hours As Needed 7)  Pravachol 20 Mg Tabs (Pravastatin Sodium) .... One Tab By Mouth At Night  Current Medications (verified): 1)  Ramipril 10 Mg Caps (Ramipril) .... One Tab By Mouth At Night 2)  Alprazolam 0.5 Mg Tabs (Alprazolam) .... One Tab By Mouth At Night As Needed For Anxiety 3)  Robitussin Chest Congestion 100 Mg/58ml Syrp (Guaifenesin) .... As Needed 4)  Saline Mist 0.65 % Soln (Saline) .... As Needed 5)  Amiloride-Hydrochlorothiazide 5-50 Mg Tabs (Amiloride-Hydrochlorothiazide) .... Take One By Mouth Two Times A Day As Needed 6)  Oxycodone-Acetaminophen 10-325 Mg Tabs (Oxycodone-Acetaminophen) .... Take One By Mouth Every 6 Hours As Needed 7)  Pravachol 20 Mg Tabs (Pravastatin Sodium) .... One Tab By Mouth At Night  Allergies: 1)  ! Naprosyn 2)  ! Cipro 3)  ! Septra 4)  ! Iron 5)  ! Morphine Sulfate (Morphine Sulfate)  Physical Exam  General:  Well-developed,well-nourished,in no acute distress; alert,appropriate and cooperative throughout examination, nontoxic and looks well.. Head:  Normocephalic and atraumatic without obvious abnormalities. No apparent alopecia or balding. Sinuses NT. Eyes:  Conjunctiva clear bilaterally.  Ears:  R ear normal and L ear normal.   Nose:  External nasal examination shows no deformity or inflammation. Nasal mucosa are pink and moist without lesions or exudates. Mouth:  no erythema and no exudates.   Neck:  supple, no masses, and no cervical  lymphadenopathy.   Chest Wall:  No deformities, masses, or tenderness noted. Lungs:  Normal respiratory effort, chest expands symmetrically. Lungs are clear to auscultation, no crackles or wheezes. Heart:  Normal rate and regular rhythm. S1 and S2 normal without gallop, murmur, click, rub or other extra sounds.   Impression & Recommendations:  Problem # 1:  DIABETES MELLITUS, TYPE II (ICD-250.00) Assessment Deteriorated Pt thinks sugar control worse. Will check FBS and A1C today and start Sulfonylurea if has indeed worsened. Tried to reassure and discuss diet and exercise. Her updated medication list for this problem includes:    Ramipril 10 Mg Caps (Ramipril) ..... One tab by mouth at night  Orders: Venipuncture (16109) TLB-Glucose, QUANT (82947-GLU) TLB-A1C / Hgb A1C (Glycohemoglobin) (83036-A1C)  Labs Reviewed: Creat: 1.0 (09/27/2009)   Microalbumin: 2.4 (05/03/2004) Reviewed HgBA1c results: 5.5 (09/24/2008)  5.7 (01/21/2008)  Problem # 2:  FATIGUE (ICD-780.79) Assessment: Unchanged Has been a chronic problem of hers for a long time. Hasn't had labs checked in a while...will check blood count and Thyroid. Orders: TLB-CBC Platelet - w/Differential (85025-CBCD) TLB-TSH (Thyroid Stimulating Hormone) (84443-TSH)  Problem # 3:  HYPERTENSION (ICD-401.9) Assessment: Unchanged Stable. Her updated medication list for this problem includes:    Ramipril 10 Mg Caps (Ramipril) ..... One tab by mouth at night    Amiloride-hydrochlorothiazide 5-50 Mg Tabs (Amiloride-hydrochlorothiazide) .[name]... Take one by mouth two times a day as needed  BP today: 114/68 Prior BP: 104/60 (01/19/2010)  Labs Reviewed: K+: 4.0 (09/27/2009) Creat: : 1.0 (09/27/2009)   Chol: 199 (09/27/2009)   HDL: 28.70 (09/27/2009)   LDL: 133 (09/27/2009)   TG: 189.0 (09/27/2009)  Problem # 4:  ANXIETY DEPRESSION (ICD-300.4) Assessment: Unchanged Seems stable. Part of the pt's problem is stress from overseeing her  mother's care at the nursing home.  Complete Medication List: 1)  Ramipril 10 Mg Caps (Ramipril) .... One tab by mouth at night 2)  Alprazolam 0.5 Mg Tabs (Alprazolam) .... One tab by mouth at night as needed for anxiety 3)  Robitussin Chest Congestion 100 Mg/35ml Syrp (Guaifenesin) .... As needed 4)  Saline Mist 0.65 % Soln (Saline) .... As needed 5)  Amiloride-hydrochlorothiazide 5-50 Mg Tabs (Amiloride-hydrochlorothiazide) .... Take one by mouth two times a day as needed 6)  Oxycodone-acetaminophen 10-325 Mg Tabs (Oxycodone-acetaminophen) .... Take one by mouth every 6 hours as needed 7)  Pravachol 20 Mg Tabs (Pravastatin sodium) .... One tab by mouth at night  Patient Instructions: 1)  Will report nml labs over phone tree. 2)  If sugar high, will call to start new medication.   Orders Added: 1)  Venipuncture [36415] 2)  TLB-Glucose, QUANT [82947-GLU] 3)  TLB-A1C / Hgb A1C (Glycohemoglobin) [83036-A1C] 4)  TLB-CBC Platelet - w/Differential [85025-CBCD] 5)  TLB-TSH (Thyroid Stimulating Hormone) [84443-TSH] 6)  Est. Patient Level IV [60454]    Current Allergies (reviewed today): ! NAPROSYN ! CIPRO ! SEPTRA ! IRON ! MORPHINE SULFATE (MORPHINE SULFATE)

## 2010-05-02 NOTE — Letter (Signed)
Summary: [facility]  [facility]   Imported By: Maryln Gottron 11/11/2009 12:33:54  _____________________________________________________________________  External Attachment:    Type:   Image     Comment:   External Document

## 2010-05-04 NOTE — Progress Notes (Signed)
Summary: alprazolam   Phone Note Refill Request Message from:  Fax from Pharmacy on March 30, 2010 10:32 AM  Refills Requested: Medication #1:  ALPRAZOLAM 0.5 MG TABS one tab by mouth at night as needed for anxiety   Last Refilled: 09/26/2009 Refill reques from [facility]. [phone].   Initial call taken by: Melody Comas,  March 30, 2010 10:33 AM  Follow-up for Phone Call        Rx phoned  to pharmacy.  Follow-up by: Melody Comas,  March 30, 2010 1:27 PM    Prescriptions: ALPRAZOLAM 0.5 MG TABS (ALPRAZOLAM) one tab by mouth at night as needed for anxiety  #30 x 5   Entered and Authorized by:   Shaune Leeks MD   Signed by:   Shaune Leeks MD on 03/30/2010   Method used:   Telephoned to ...       [facility] [address] (retail)       [address]       [place], [place]  [address]       Ph: [phone]       Fax: [phone]   RxID:   [id number]

## 2010-05-04 NOTE — Assessment & Plan Note (Signed)
Summary: 1 WK F/U DLO   Vital Signs:  Patient profile:   77 year old female Weight:      145.50 pounds Temp:     97.4 degrees F oral Pulse rate:   88 / minute Pulse rhythm:   regular BP sitting:   118 / 70  (left arm) Cuff size:   regular  Vitals Entered By: Sydell Axon LPN (March 15, 2010 10:32 AM) CC: One week follow-up, "doing better"   History of Present Illness: Pt here for followup of pneumonia from last week. She has felt poorly and had no specific complaint but was coughing and had bad lung sounds on ausculation, esp LLL. She was put on Zithromax and Guaif and has done well. Lungs are finally loosening up and she is bringing up lots of clear sputum now. She feels much better. She has lots of anxiety. Her daughter had nerve ablation done yesterday, which went well with her now sleeping a lot. She also has a brother who is coming to see her mother Christmas Day and this is very stressful as noone gets along with him. She and her daughter are going to her son's house for Christmas on Christmas Day. She is having a hhard time getting her pain medication from Dr [name] for her back. He is moving his office to [place].  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) 5)  Diverticulosis of Colon  (ICD-562.10) 6)  Colitis  (ICD-558.9) 7)  Anxiety Depression  (ICD-300.4) 8)  Obesity  (ICD-278.00) 9)  Eczema  (ICD-692.9) 10)  Leg Pain, Bilateral  (ICD-729.5) 11)  Arteriovenous Malformation, Gastric  (ICD-747.60) 12)  Pain, Chronic Nec (ON ELAVIL)  (ICD-338.29) 13)  Insomnia, Chronic  (ICD-307.42) 14)  Fatty Liver Disease  (ICD-571.8) 15)  Low Back Pain, Chronic ( PAIN [facility])  (ICD-724.2) 16)  COPD  (ICD-496) 17)  Hyperlipidemia  (ICD-272.4) 18)  Coronary Artery Disease  (ICD-414.00) 19)  Osteoporosis  (ICD-733.00) 20)   Diabetes Mellitus, Type II  (ICD-250.00) 21)  Anemia-iron Deficiency  (ICD-280.9) 22)  Osteoarthritis  (ICD-715.90) 23)  Rheumatoid Arthritis  (ICD-714.0) 24)  Hypertension  (ICD-401.9)  Medications Prior to Update: 1)  Ramipril 10 Mg Caps (Ramipril) .... One Tab By Mouth At Night 2)  Alprazolam 0.5 Mg Tabs (Alprazolam) .... One Tab By Mouth At Night As Needed For Anxiety 3)  Robitussin Chest Congestion 100 Mg/42ml Syrp (Guaifenesin) .... As Needed 4)  Saline Mist 0.65 % Soln (Saline) .... As Needed 5)  Amiloride-Hydrochlorothiazide 5-50 Mg Tabs (Amiloride-Hydrochlorothiazide) .... Take One By Mouth Two Times A Day As Needed 6)  Oxycodone-Acetaminophen 10-325 Mg Tabs (Oxycodone-Acetaminophen) .... Take One By Mouth Every 6 Hours As Needed 7)  Pravachol 20 Mg Tabs (Pravastatin Sodium) .... One Tab By Mouth At Night 8)  Geritol Tonic  Liqd (Iron-Vitamins) .... Take One Teaspoon Daily 9)  Vitamin D 400 Unit Caps (Cholecalciferol) .... Take One By Mouth Daily 10)  Potassium 99 Mg Tabs (Potassium) .... Take One By Mouth Daily 11)  Vitamin B-12 500 Mcg Tabs (Cyanocobalamin) .... Take One By Mouth Daily 12)  Zithromax Z-Pak 250 Mg Tabs (Azithromycin) .... As Dir  Allergies: 1)  ! Naprosyn 2)  ! Cipro 3)  ! Septra 4)  ! Iron 5)  ! Morphine Sulfate (Morphine Sulfate)  Physical Exam  General:  Well-developed,well-nourished,in no acute distress; alert,appropriate and cooperative throughout examination, nontoxic and looks better. Head:  Normocephalic and atraumatic without obvious abnormalities. No apparent alopecia or balding. Sinuses NT. Eyes:  Conjunctiva clear bilaterally.  Ears:  R ear normal and L ear normal.   Nose:  External nasal examination shows no deformity or inflammation. Nasal mucosa are pink and moist without lesions or exudates. Mouth:  no erythema and no exudates.   Neck:  supple, no masses, and no cervical lymphadenopathy.   Lungs:  Normal respiratory effort, chest expands  symmetrically. Lungs reasonably clear today with good airflow. Heart:  Normal rate and regular rhythm. S1 and S2 normal without gallop, murmur, click, rub or other extra sounds.   Impression & Recommendations:  Problem # 1:  BRONCHITIS- ACUTE (ICD-466.0) Assessment Improved Cont Guaif for another two Frayne. Her updated medication list for this problem includes:    Robitussin Chest Congestion 100 Mg/4ml Syrp (Guaifenesin) .[name]... As needed    Zithromax Z-pak 250 Mg Tabs (Azithromycin) .[name]... As dir    Mucus Relief 400 Mg Tabs (Guaifenesin) .[name]... Take one by mouth as directed as needed  Problem # 2:  FATIGUE (ICD-780.79) Assessment: Improved  Problem # 3:  ANXIETY DEPRESSION (ICD-300.4) Assessment: Unchanged Stable, slightly agitated over her brother.  Complete Medication List: 1)  Ramipril 10 Mg Caps (Ramipril) .... One tab by mouth at night 2)  Alprazolam 0.5 Mg Tabs (Alprazolam) .... One tab by mouth at night as needed for anxiety 3)  Robitussin Chest Congestion 100 Mg/64ml Syrp (Guaifenesin) .... As needed 4)  Saline Mist 0.65 % Soln (Saline) .... As needed 5)  Amiloride-hydrochlorothiazide 5-50 Mg Tabs (Amiloride-hydrochlorothiazide) .... Take one by mouth two times a day as needed 6)  Oxycodone-acetaminophen 10-325 Mg Tabs (Oxycodone-acetaminophen) .... Take one by mouth every 6 hours as needed 7)  Pravachol 20 Mg Tabs (Pravastatin sodium) .... One tab by mouth at night 8)  Geritol Tonic Liqd (Iron-vitamins) .... Take one teaspoon daily 9)  Vitamin D 400 Unit Caps (Cholecalciferol) .... Take one by mouth daily 10)  Potassium 99 Mg Tabs (Potassium) .... Take one by mouth daily 11)  Vitamin B-12 500 Mcg Tabs (Cyanocobalamin) .... Take one by mouth daily 12)  Zithromax Z-pak 250 Mg Tabs (Azithromycin) .... As dir 13)  Mucus Relief 400 Mg Tabs (Guaifenesin) .... Take one by mouth as directed as needed 14)  Co Q-10 200 Mg Caps (Coenzyme q10) .... Take one by mouth daily   Orders  Added: 1)  Est. Patient Level III [98119]    Current Allergies (reviewed today): ! NAPROSYN ! CIPRO ! SEPTRA ! IRON ! MORPHINE SULFATE (MORPHINE SULFATE)

## 2010-05-04 NOTE — Assessment & Plan Note (Signed)
Summary: 3 MONTH FOLLOW UP/[facility]   Vital Signs:  Patient profile:   77 year old female Weight:      145 pounds Temp:     97.1 degrees F oral Pulse rate:   84 / minute Pulse rhythm:   regular BP sitting:   104 / 60  (left arm) Cuff size:   large  Vitals Entered By: Sydell Axon LPN (April 26, 2010 2:36 PM) CC: 3 Month follow-up after labs   History of Present Illness: Pt here officially for three month followup of Chol trmt with Pravachol which she is tolerating well without problem. Her biggest problem is her back which continues to bother her, esp the local area around the [facility] where she has local discomfort. She has now developed pain and tingling on the lateral side of the left foot. She is to see Dr [name] tomm. Her Christmas was reasonably uneventful as her brother did not come and all was relaxed.  She has been careful with her diet, generally avoiding sweets and carbs. She has no complaints today other than the lat foot discomfort.  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) 4)  Diverticulosis of Colon  (ICD-562.10) 5)  Colitis  (ICD-558.9) 6)  Anxiety Depression  (ICD-300.4) 7)  Obesity  (ICD-278.00) 8)  Eczema  (ICD-692.9) 9)  Leg Pain, Bilateral  (ICD-729.5) 10)  Arteriovenous Malformation, Gastric  (ICD-747.60) 11)  Pain, Chronic Nec (ON ELAVIL)  (ICD-338.29) 12)  Insomnia, Chronic  (ICD-307.42) 13)  Fatty Liver Disease  (ICD-571.8) 14)  Low Back Pain, Chronic ( PAIN [facility])  (ICD-724.2) 15)  COPD  (ICD-496) 16)  Hyperlipidemia  (ICD-272.4) 17)  Coronary Artery Disease  (ICD-414.00) 18)  Osteoporosis  (ICD-733.00) 19)  Diabetes Mellitus, Type II  (ICD-250.00) 20)  Anemia-iron Deficiency  (ICD-280.9) 21)  Osteoarthritis  (ICD-715.90) 22)  Rheumatoid Arthritis  (ICD-714.0) 23)  Hypertension  (ICD-401.9)  Medications Prior to  Update: 1)  Ramipril 10 Mg Caps (Ramipril) .... One Tab By Mouth At Night 2)  Alprazolam 0.5 Mg Tabs (Alprazolam) .... One Tab By Mouth At Night As Needed For Anxiety 3)  Robitussin Chest Congestion 100 Mg/50ml Syrp (Guaifenesin) .... As Needed 4)  Saline Mist 0.65 % Soln (Saline) .... As Needed 5)  Amiloride-Hydrochlorothiazide 5-50 Mg Tabs (Amiloride-Hydrochlorothiazide) .... Take One By Mouth Two Times A Day As Needed 6)  Oxycodone-Acetaminophen 10-325 Mg Tabs (Oxycodone-Acetaminophen) .... Take One By Mouth Every 6 Hours As Needed 7)  Pravachol 20 Mg Tabs (Pravastatin Sodium) .... One Tab By Mouth At Night 8)  Geritol Tonic  Liqd (Iron-Vitamins) .... Take One Teaspoon Daily 9)  Vitamin D 400 Unit Caps (Cholecalciferol) .... Take One By Mouth Daily 10)  Potassium 99 Mg Tabs (Potassium) .... Take One By Mouth Daily 11)  Vitamin B-12 500 Mcg Tabs (Cyanocobalamin) .... Take One By Mouth Daily 12)  Zithromax Z-Pak 250 Mg Tabs (Azithromycin) .... As Dir 13)  Mucus Relief 400 Mg Tabs (Guaifenesin) .... Take One By Mouth As Directed As Needed 14)  Co Q-10 200 Mg Caps (Coenzyme Q10) .... Take One By Mouth Daily  Current Medications (verified): 1)  Ramipril 10 Mg Caps (Ramipril) .... One Tab By Mouth At Night 2)  Alprazolam 0.5 Mg Tabs (Alprazolam) .... One Tab By Mouth At Night As Needed For Anxiety 3)  Amiloride-Hydrochlorothiazide 5-50 Mg Tabs (  Amiloride-Hydrochlorothiazide) .... Take One By Mouth Two Times A Day As Needed 4)  Oxycodone-Acetaminophen 10-325 Mg Tabs (Oxycodone-Acetaminophen) .... Take One By Mouth Every 6 Hours As Needed 5)  Pravachol 20 Mg Tabs (Pravastatin Sodium) .... One Tab By Mouth At Night 6)  Vitamin D 400 Unit Caps (Cholecalciferol) .... Take One By Mouth Daily 7)  Potassium 99 Mg Tabs (Potassium) .... Take One By Mouth Daily 8)  Vitamin B-12 500 Mcg Tabs (Cyanocobalamin) .... Take One By Mouth Daily 9)  Mucus Relief 400 Mg Tabs (Guaifenesin) .... Take One By Mouth As  Directed As Needed 10)  Co Q-10 200 Mg Caps (Coenzyme Q10) .... Take One By Mouth Daily  Allergies: 1)  ! Naprosyn 2)  ! Cipro 3)  ! Septra 4)  ! Iron 5)  ! Morphine Sulfate (Morphine Sulfate)  Physical Exam  General:  Well-developed,well-nourished,in no acute distress; alert,appropriate and cooperative throughout examination, nontoxic and looks better. Head:  Normocephalic and atraumatic without obvious abnormalities. No apparent alopecia or balding. Sinuses NT. Eyes:  Conjunctiva clear bilaterally.  Ears:  R ear normal and L ear normal.   Nose:  External nasal examination shows no deformity or inflammation. Nasal mucosa are pink and moist without lesions or exudates. Mouth:  no erythema and no exudates.   Neck:  supple, no masses, and no cervical lymphadenopathy.   Chest Wall:  No deformities, masses, or tenderness noted. Lungs:  Normal respiratory effort, chest expands symmetrically. Lungs reasonably clear today with good airflow. Heart:  Normal rate and regular rhythm. S1 and S2 normal without gallop, murmur, click, rub or other extra sounds. Abdomen:  No suprapubic tenderness.   Impression & Recommendations:  Problem # 1:  HYPERLIPIDEMIA (ICD-272.4) Assessment Improved Doing great on Pravachol with great response in nos. Cont meds. Is on Co Q 10..no muscle complaints. Her updated medication list for this problem includes:    Pravachol 20 Mg Tabs (Pravastatin sodium) ..... One tab by mouth at night  Labs Reviewed: SGOT: 19 (04/20/2010)   SGPT: 14 (04/20/2010)   HDL:26.20 (04/20/2010), 28.70 (09/27/2009)  LDL:93 (04/20/2010), 133 (04/54/0981)  Chol:140 (04/20/2010), 199 (09/27/2009)  Trig:102.0 (04/20/2010), 189.0 (09/27/2009)  Problem # 2:  FATIGUE (ICD-780.79) Assessment: Improved Feels well.  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 Her updated medication list for this problem includes:    Oxycodone-acetaminophen 10-325 Mg Tabs (Oxycodone-acetaminophen) .[name]... Take one by mouth every 6 hours as needed  Problem # 4:  DIABETES MELLITUS, TYPE II (ICD-250.00) Assessment: Unchanged Stable with good control. Cont. Her updated medication list for this problem includes:    Ramipril 10 Mg Caps (Ramipril) ..... One tab by mouth at night  Labs Reviewed: Creat: 1.0 (09/27/2009)   Microalbumin: 2.4 (05/03/2004) Reviewed HgBA1c results: 5.9 (04/20/2010)  5.7 (03/02/2010)  Problem # 5:  HYPERTENSION (ICD-401.9) Assessment: Unchanged Stable. Her updated medication list for this problem includes:    Ramipril 10 Mg Caps (Ramipril) ..... One tab by mouth at night    Amiloride-hydrochlorothiazide 5-50 Mg Tabs (Amiloride-hydrochlorothiazide) .[name]... Take one by mouth two times a day as needed  BP today: 104/60 Prior BP: 118/70 (03/15/2010)  Labs Reviewed: K+: 4.0 (09/27/2009) Creat: : 1.0 (09/27/2009)   Chol: 140 (04/20/2010)   HDL: 26.20 (04/20/2010)   LDL: 93 (04/20/2010)   TG: 102.0 (04/20/2010)  Complete Medication List: 1)  Ramipril 10 Mg Caps (Ramipril) .Marland KitchenMarland KitchenMarland Kitchen  One tab by mouth at night 2)  Alprazolam 0.5 Mg Tabs (Alprazolam) .... One tab by mouth at night as needed for anxiety 3)  Amiloride-hydrochlorothiazide 5-50 Mg Tabs (Amiloride-hydrochlorothiazide) .... Take one by mouth two times a day as needed 4)  Oxycodone-acetaminophen 10-325 Mg Tabs (Oxycodone-acetaminophen) .... Take one by mouth every 6 hours as needed 5)  Pravachol 20 Mg Tabs (Pravastatin sodium) .... One tab by mouth at night 6)  Vitamin D 400 Unit Caps (Cholecalciferol) .... Take one by mouth daily 7)  Potassium 99 Mg Tabs (Potassium) .... Take one by mouth daily 8)  Vitamin B-12 500 Mcg Tabs (Cyanocobalamin) .... Take one by mouth daily 9)  Mucus Relief 400 Mg Tabs (Guaifenesin) .... Take one by mouth as directed as needed 10)  Co Q-10 200 Mg Caps  (Coenzyme q10) .... Take one by mouth daily  Patient Instructions: 1)  RTC 5mos for Comp Exam, labs prior   Orders Added: 1)  Est. Patient Level III [65784]    Current Allergies (reviewed today): ! NAPROSYN ! CIPRO ! SEPTRA ! IRON ! MORPHINE SULFATE (MORPHINE SULFATE)

## 2010-05-10 ENCOUNTER — Encounter: Payer: Self-pay | Admitting: Family Medicine

## 2010-05-10 ENCOUNTER — Ambulatory Visit (INDEPENDENT_AMBULATORY_CARE_PROVIDER_SITE_OTHER): Payer: Medicare Other | Admitting: Family Medicine

## 2010-05-10 DIAGNOSIS — J069 Acute upper respiratory infection, unspecified: Secondary | ICD-10-CM

## 2010-05-10 DIAGNOSIS — R300 Dysuria: Secondary | ICD-10-CM

## 2010-05-18 NOTE — Assessment & Plan Note (Signed)
Summary: COUGH, CONGESTION   Vital Signs:  Patient profile:   77 year old female Weight:      146.50 pounds O2 Sat:      98 % on Room air Temp:     97.4 degrees F oral Pulse rate:   84 / minute Pulse rhythm:   regular BP sitting:   124 / 78  (left arm) Cuff size:   large  Vitals Entered By: Sydell Axon LPN (May 10, 2010 11:17 AM)  O2 Flow:  Room air CC: Runny nose, productive cough/clear, congestion   History of Present Illness: Pt here for cough and congestion. She has seen Dr [name] and is looking at getting a different battery. She has lots of nasal discharge, runny nose worse at night with running during the day as well. She has been to the nursing home where her mother has pneumonia. She feels feverish but hasn't taken her temp due to the frequent drinking.  She thinks she ha sfever, she has chilled, she has no headache, no ear pain, lots of clear rhinitis. Mild ST with thick clear productive cough. She feels like she has been run over by a truck.  She has lots of stress going on right now with family probs. There is 2 1000 dollar policies that is a problem...making arrangements with the funeral home for cremation.  She  took a Zpak in early Dec, which helped but recurred.  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) 4)  Diverticulosis of Colon  (ICD-562.10) 5)  Colitis  (ICD-558.9) 6)  Anxiety Depression  (ICD-300.4) 7)  Obesity  (ICD-278.00) 8)  Eczema  (ICD-692.9) 9)  Leg Pain, Bilateral  (ICD-729.5) 10)  Arteriovenous Malformation, Gastric  (ICD-747.60) 11)  Pain, Chronic Nec (ON ELAVIL)  (ICD-338.29) 12)  Insomnia, Chronic  (ICD-307.42) 13)  Fatty Liver Disease  (ICD-571.8) 14)  Low Back Pain, Chronic ( PA[facility])  (ICD-724.2) 15)  COPD  (ICD-496) 16)  Hyperlipidemia  (ICD-272.4) 17)  Coronary Artery Disease  (ICD-414.00) 18)   Osteoporosis  (ICD-733.00) 19)  Diabetes Mellitus, Type II  (ICD-250.00) 20)  Anemia-iron Deficiency  (ICD-280.9) 21)  Osteoarthritis  (ICD-715.90) 22)  Rheumatoid Arthritis  (ICD-714.0) 23)  Hypertension  (ICD-401.9)  Medications Prior to Update: 1)  Ramipril 10 Mg Caps (Ramipril) .... One Tab By Mouth At Night 2)  Alprazolam 0.5 Mg Tabs (Alprazolam) .... One Tab By Mouth At Night As Needed For Anxiety 3)  Amiloride-Hydrochlorothiazide 5-50 Mg Tabs (Amiloride-Hydrochlorothiazide) .... Take One By Mouth Two Times A Day As Needed 4)  Oxycodone-Acetaminophen 10-325 Mg Tabs (Oxycodone-Acetaminophen) .... Take One By Mouth Every 6 Hours As Needed 5)  Pravachol 20 Mg Tabs (Pravastatin Sodium) .... One Tab By Mouth At Night 6)  Vitamin D 400 Unit Caps (Cholecalciferol) .... Take One By Mouth Daily 7)  Potassium 99 Mg Tabs (Potassium) .... Take One By Mouth Daily 8)  Vitamin B-12 500 Mcg Tabs (Cyanocobalamin) .... Take One By Mouth Daily 9)  Mucus Relief 400 Mg Tabs (Guaifenesin) .... Take One By Mouth As Directed As Needed 10)  Co Q-10 200 Mg Caps (Coenzyme Q10) .... Take One By Mouth Daily  Current Medications (verified): 1)  Ramipril 10 Mg Caps (Ramipril) .... One Tab By Mouth At Night 2)  Alprazolam 0.5 Mg Tabs (Alprazolam) .... One Tab By Mouth At Night  As Needed For Anxiety 3)  Amiloride-Hydrochlorothiazide 5-50 Mg Tabs (Amiloride-Hydrochlorothiazide) .... Take One By Mouth Two Times A Day As Needed 4)  Oxycodone-Acetaminophen 10-325 Mg Tabs (Oxycodone-Acetaminophen) .... Take One By Mouth Every 6 Hours As Needed 5)  Pravachol 20 Mg Tabs (Pravastatin Sodium) .... One Tab By Mouth At Night 6)  Vitamin D 400 Unit Caps (Cholecalciferol) .... Take One By Mouth Daily 7)  Potassium 99 Mg Tabs (Potassium) .... Take One By Mouth Daily 8)  Vitamin B-12 500 Mcg Tabs (Cyanocobalamin) .... Take One By Mouth Daily 9)  Mucus Relief 400 Mg Tabs (Guaifenesin) .... Take One By Mouth As Directed As  Needed 10)  Co Q-10 200 Mg Caps (Coenzyme Q10) .... Take One By Mouth Daily  Allergies: 1)  ! Naprosyn 2)  ! Cipro 3)  ! Septra 4)  ! Iron 5)  ! Morphine Sulfate (Morphine Sulfate)  Physical Exam  General:  Well-developed,well-nourished,in no acute distress; alert,appropriate and cooperative throughout examination, nontoxic but runny nose and infrequent wet cough.. Head:  Normocephalic and atraumatic without obvious abnormalities. No apparent alopecia or balding. Sinuses NT. Eyes:  Conjunctiva clear bilaterally.  Ears:  R ear normal and L ear normal.   Nose:  External nasal examination shows no deformity or inflammation. Nasal mucosa are pink and moist without lesions or exudates. Mouth:  no erythema and no exudates.   Neck:  supple, no masses, and no cervical lymphadenopathy.   Lungs:  Normal respiratory effort, chest expands symmetrically. Lungs reasonably clear today with good airflow. Heart:  Normal rate and regular rhythm. S1 and S2 normal without gallop, murmur, click, rub or other extra sounds.   Impression & Recommendations:  Problem # 1:  URI (ICD-465.9) Assessment New  See instructions. Her updated medication list for this problem includes:    Mucus Relief 400 Mg Tabs (Guaifenesin) .[name]... Take one by mouth as directed as needed  Instructed on symptomatic treatment. Call if symptoms persist or worsen.   Problem # 2:  DYSURIA, OCCAS (ICD-788.1) Assessment: New  Discussed Azo and fluids. Call if worsens.  Encouraged to push clear liquids, get enough rest, and take acetaminophen as needed. To be seen in 10 days if no improvement, sooner if worse.  Complete Medication List: 1)  Ramipril 10 Mg Caps (Ramipril) .... One tab by mouth at night 2)  Alprazolam 0.5 Mg Tabs (Alprazolam) .... One tab by mouth at night as needed for anxiety 3)  Amiloride-hydrochlorothiazide 5-50 Mg Tabs (Amiloride-hydrochlorothiazide) .... Take one by mouth two times a day as needed 4)   Oxycodone-acetaminophen 10-325 Mg Tabs (Oxycodone-acetaminophen) .... Take one by mouth every 6 hours as needed 5)  Pravachol 20 Mg Tabs (Pravastatin sodium) .... One tab by mouth at night 6)  Vitamin D 400 Unit Caps (Cholecalciferol) .... Take one by mouth daily 7)  Potassium 99 Mg Tabs (Potassium) .... Take one by mouth daily 8)  Vitamin B-12 500 Mcg Tabs (Cyanocobalamin) .... Take one by mouth daily 9)  Mucus Relief 400 Mg Tabs (Guaifenesin) .... Take one by mouth as directed as needed 10)  Co Q-10 200 Mg Caps (Coenzyme q10) .... Take one by mouth daily  Patient Instructions: 1)  Use AZO for five days. 2)  Call if sxs worsen. 3)  Take Guaifenesin by going to [facility] or [facility] and getting MUCOUS RELIEF EXPECTORANT (400mg ), take 11/2 tabs by mouth AM and NOON. 4)  Drink lots of fluids anytime taking Guaifenesin.  5)  Take Tyl regularly  as discussed. 6)  Gargle with warm salt water every half hour for two days. 7)  Needs eye exam....can't afford it currently with family stressors.   Orders Added: 1)  Est. Patient Level III [01601]    Current Allergies (reviewed today): ! NAPROSYN ! CIPRO ! SEPTRA ! IRON ! MORPHINE SULFATE (MORPHINE SULFATE)

## 2010-05-29 ENCOUNTER — Encounter: Payer: Self-pay | Admitting: Family Medicine

## 2010-05-29 ENCOUNTER — Ambulatory Visit (INDEPENDENT_AMBULATORY_CARE_PROVIDER_SITE_OTHER): Payer: Medicare Other | Admitting: Family Medicine

## 2010-05-29 DIAGNOSIS — J309 Allergic rhinitis, unspecified: Secondary | ICD-10-CM

## 2010-06-08 NOTE — Assessment & Plan Note (Signed)
Summary: DRAINAGE,COUGH/CLE   [facility]MA[facility]/MEDICARE   Vital Signs:  Patient profile:   77 year old female Height:      61 inches Weight:      146.25 pounds BMI:     27.73 Temp:     98.5 degrees F oral Pulse rate:   84 / minute Pulse rhythm:   regular BP sitting:   110 / 56  (left arm) Cuff size:   regular  Vitals Entered By: Delilah Shan CMA Zo Loudon Dull) (May 29, 2010 8:08 AM) CC: Drainage, cough   History of Present Illness: Continues to have sinus drainage.  No HA and no fevers >100.  Continues to have clear drainage with post nasal gtt.  Surgery has been put off in the meantime.  Likely sick contacts.  No facial pain.  Cough is from the post nasal gtt and sputum is clear.  Not short of breath.  No CP.  No bilateral lower extremity edema.  Sx are static and unchanged.  Some fatigue.  Looking after mother in rest home.  This episode has been going on for about 3 Mer[place]weather. This is not typical for allergies for patient.  H/o C diff and this was d/w patient.    Allergies: 1)  ! Naprosyn 2)  ! Cipro 3)  ! Septra 4)  ! Iron 5)  ! Morphine Sulfate (Morphine Sulfate)  Review of Systems       See HPI.  Otherwise negative.    Physical Exam  General:  GEN: nad, alert and oriented, nontoxic.  HEENT: mucous membranes moist, TM w/o erythema, nasal epithelium injected, OP with cobblestoning, max/frontal sinuses not tender to palpation  NECK: supple w/o LA CV: rrr. PULM: ctab, no inc wob ABD: soft, +bs   Impression & Recommendations:  Problem # 1:  RHINITIS (ICD-477.9) Unclear source.  Possible allergic vs viral.  We talked about options.  She would like to avoid antibiotics due to h/o C diff colitis.  I think this is reasonable for now.  I would try the flonase to see if this helps and then call back as needed.  She agrees.  Nontoxic.  Her updated medication list for this problem includes:    Benadryl 25 Mg Caps (Diphenhydramine hcl) ..... One capsule every 4 hours    Ocean Nasal Spray  0.65 % Soln (Saline) .[name]... As needed    Fluticasone Propionate 50 Mcg/act Susp (Fluticasone propionate) .[name]... 2 sprays per nostril per day after using nasal saline  Orders: Prescription Created Electronically [phone]  Complete Medication List: 1)  Ramipril 10 Mg Caps (Ramipril) .... One tab by mouth at night 2)  Alprazolam 0.5 Mg Tabs (Alprazolam) .... One tab by mouth at night as needed for anxiety 3)  Amiloride-hydrochlorothiazide 5-50 Mg Tabs (Amiloride-hydrochlorothiazide) .... Take one by mouth two times a day as needed 4)  Oxycodone-acetaminophen 10-325 Mg Tabs (Oxycodone-acetaminophen) .... Take one by mouth every 6 hours as needed 5)  Pravachol 20 Mg Tabs (Pravastatin sodium) .... One tab by mouth at night 6)  Vitamin D 400 Unit Caps (Cholecalciferol) .... Take one by mouth daily 7)  Potassium 99 Mg Tabs (Potassium) .... Take one by mouth daily 8)  Vitamin B-12 500 Mcg Tabs (Cyanocobalamin) .... Take one by mouth daily 9)  Mucus Relief 400 Mg Tabs (Guaifenesin) .... Take one by mouth as directed as needed 10)  Co Q-10 200 Mg Caps (Coenzyme q10) .... Take one by mouth daily 11)  Benadryl 25 Mg Caps (Diphenhydramine hcl) .... One capsule  every 4 hours 12)  Ocean Nasal Spray 0.65 % Soln (Saline) .... As needed 13)  Vicks Vaporub 4.73-1.2-2.6 % Oint (Camphor-eucalyptus-menthol) .... As needed in nasal passages 14)  Fluticasone Propionate 50 Mcg/act Susp (Fluticasone propionate) .... 2 sprays per nostril per day after using nasal saline  Patient Instructions: 1)  Try the flonase/fluticasone at night after using the nasal saline. Use 2 sprays in each nostil per day.  This should gradually improve.  Call me if you aren't getting better by next week.  Take care.  Prescriptions: FLUTICASONE PROPIONATE 50 MCG/ACT SUSP (FLUTICASONE PROPIONATE) 2 sprays per nostril per day after using nasal saline  #1 x 1   Entered and Authorized by:   Crawford Givens MD   Signed by:   Crawford Givens MD on  05/29/2010   Method used:   Electronically to        [facility] [address]* (retail)       [address]       [place], [place]  [address]       Ph: [phone]       Fax: [phone]   RxID:   [id number]    Orders Added: 1)  Prescription Created Electronically [G8553] 2)  Est. Patient Level III [84132]    Current Allergies (reviewed today): ! NAPROSYN ! CIPRO ! SEPTRA ! IRON ! MORPHINE SULFATE (MORPHINE SULFATE)

## 2010-06-17 ENCOUNTER — Encounter: Payer: Self-pay | Admitting: Family Medicine

## 2010-06-18 LAB — GLUCOSE, CAPILLARY: Glucose-Capillary: 115 mg/dL — ABNORMAL HIGH (ref 70–99)

## 2010-06-19 LAB — URINALYSIS, ROUTINE W REFLEX MICROSCOPIC
Bilirubin Urine: NEGATIVE
Glucose, UA: NEGATIVE mg/dL
Hgb urine dipstick: NEGATIVE
Ketones, ur: NEGATIVE mg/dL
Nitrite: POSITIVE — AB
Protein, ur: 100 mg/dL — AB
Protein, ur: NEGATIVE mg/dL
Specific Gravity, Urine: 1.02 (ref 1.005–1.030)
Urobilinogen, UA: 0.2 mg/dL (ref 0.0–1.0)
pH: 6.5 (ref 5.0–8.0)

## 2010-06-19 LAB — DIFFERENTIAL
Basophils Absolute: 0.1 10*3/uL (ref 0.0–0.1)
Basophils Relative: 1 % (ref 0–1)
Eosinophils Absolute: 0.4 10*3/uL (ref 0.0–0.7)
Eosinophils Relative: 3 % (ref 0–5)
Lymphs Abs: 3.2 10*3/uL (ref 0.7–4.0)
Monocytes Absolute: 0.7 10*3/uL (ref 0.1–1.0)
Monocytes Relative: 8 % (ref 3–12)
Neutro Abs: 6.2 10*3/uL (ref 1.7–7.7)
Neutrophils Relative %: 62 % (ref 43–77)

## 2010-06-19 LAB — BASIC METABOLIC PANEL
Calcium: 10.1 mg/dL (ref 8.4–10.5)
Chloride: 105 mEq/L (ref 96–112)
Creatinine, Ser: 1.08 mg/dL (ref 0.4–1.2)
GFR calc Af Amer: 56 mL/min — ABNORMAL LOW (ref >60)
GFR calc Af Amer: 60 mL/min — ABNORMAL LOW (ref >60)
Potassium: 4.1 mEq/L (ref 3.5–5.1)
Sodium: 139 mEq/L (ref 135–145)

## 2010-06-19 LAB — CBC
MCHC: 34.4 g/dL (ref 30.0–36.0)
Platelets: 249 10*3/uL (ref 150–400)
RBC: 3.73 MIL/uL — ABNORMAL LOW (ref 3.87–5.11)
RBC: 3.77 MIL/uL — ABNORMAL LOW (ref 3.87–5.11)
WBC: 10.9 10*3/uL — ABNORMAL HIGH (ref 4.0–10.5)
WBC: 9.9 10*3/uL (ref 4.0–10.5)

## 2010-06-19 LAB — COMPREHENSIVE METABOLIC PANEL
ALT: 18 U/L (ref 0–35)
AST: 20 U/L (ref 0–37)
Albumin: 4.1 g/dL (ref 3.5–5.2)
CO2: 23 mEq/L (ref 19–32)
Chloride: 103 mEq/L (ref 96–112)
GFR calc Af Amer: 54 mL/min — ABNORMAL LOW (ref >60)
GFR calc non Af Amer: 45 mL/min — ABNORMAL LOW (ref >60)
Sodium: 135 mEq/L (ref 135–145)
Total Bilirubin: 0.7 mg/dL (ref 0.3–1.2)

## 2010-06-19 LAB — SURGICAL PCR SCREEN: MRSA, PCR: NEGATIVE

## 2010-06-19 LAB — URINE MICROSCOPIC-ADD ON

## 2010-06-19 LAB — GLUCOSE, CAPILLARY
Glucose-Capillary: 114 mg/dL — ABNORMAL HIGH (ref 70–99)
Glucose-Capillary: 115 mg/dL — ABNORMAL HIGH (ref 70–99)
Glucose-Capillary: 172 mg/dL — ABNORMAL HIGH (ref 70–99)

## 2010-06-19 LAB — APTT: aPTT: 27 seconds (ref 24–37)

## 2010-06-19 LAB — PROTIME-INR
INR: 1.08 (ref 0.00–1.49)
Prothrombin Time: 13.8 seconds (ref 11.6–15.2)

## 2010-07-13 LAB — RENAL FUNCTION PANEL
Albumin: 3.5 g/dL (ref 3.5–5.2)
BUN: 13 mg/dL (ref 6–23)
Creatinine, Ser: 0.82 mg/dL (ref 0.4–1.2)
Phosphorus: 3 mg/dL (ref 2.3–4.6)
Potassium: 3.8 mEq/L (ref 3.5–5.1)

## 2010-07-13 LAB — GLUCOSE, CAPILLARY
Glucose-Capillary: 100 mg/dL — ABNORMAL HIGH (ref 70–99)
Glucose-Capillary: 101 mg/dL — ABNORMAL HIGH (ref 70–99)
Glucose-Capillary: 107 mg/dL — ABNORMAL HIGH (ref 70–99)
Glucose-Capillary: 108 mg/dL — ABNORMAL HIGH (ref 70–99)
Glucose-Capillary: 114 mg/dL — ABNORMAL HIGH (ref 70–99)
Glucose-Capillary: 119 mg/dL — ABNORMAL HIGH (ref 70–99)
Glucose-Capillary: 124 mg/dL — ABNORMAL HIGH (ref 70–99)
Glucose-Capillary: 127 mg/dL — ABNORMAL HIGH (ref 70–99)
Glucose-Capillary: 128 mg/dL — ABNORMAL HIGH (ref 70–99)
Glucose-Capillary: 138 mg/dL — ABNORMAL HIGH (ref 70–99)
Glucose-Capillary: 76 mg/dL (ref 70–99)
Glucose-Capillary: 79 mg/dL (ref 70–99)
Glucose-Capillary: 86 mg/dL (ref 70–99)
Glucose-Capillary: 92 mg/dL (ref 70–99)
Glucose-Capillary: 95 mg/dL (ref 70–99)
Glucose-Capillary: 96 mg/dL (ref 70–99)
Glucose-Capillary: 99 mg/dL (ref 70–99)

## 2010-07-13 LAB — DIFFERENTIAL
Basophils Absolute: 0 10*3/uL (ref 0.0–0.1)
Basophils Relative: 0 % (ref 0–1)
Basophils Relative: 1 % (ref 0–1)
Eosinophils Absolute: 0.1 10*3/uL (ref 0.0–0.7)
Eosinophils Absolute: 0.2 10*3/uL (ref 0.0–0.7)
Monocytes Relative: 6 % (ref 3–12)
Neutro Abs: 9.1 10*3/uL — ABNORMAL HIGH (ref 1.7–7.7)
Neutrophils Relative %: 46 % (ref 43–77)
Neutrophils Relative %: 83 % — ABNORMAL HIGH (ref 43–77)

## 2010-07-13 LAB — COMPREHENSIVE METABOLIC PANEL
BUN: 61 mg/dL — ABNORMAL HIGH (ref 6–23)
CO2: 28 mEq/L (ref 19–32)
Calcium: 7.5 mg/dL — ABNORMAL LOW (ref 8.4–10.5)
Creatinine, Ser: 0.98 mg/dL (ref 0.4–1.2)
GFR calc non Af Amer: 55 mL/min — ABNORMAL LOW (ref >60)
Glucose, Bld: 106 mg/dL — ABNORMAL HIGH (ref 70–99)

## 2010-07-13 LAB — BASIC METABOLIC PANEL
BUN: 5 mg/dL — ABNORMAL LOW (ref 6–23)
CO2: 26 mEq/L (ref 19–32)
CO2: 26 mEq/L (ref 19–32)
Calcium: 9.1 mg/dL (ref 8.4–10.5)
Calcium: 9.1 mg/dL (ref 8.4–10.5)
Chloride: 108 mEq/L (ref 96–112)
Creatinine, Ser: 0.63 mg/dL (ref 0.4–1.2)
Creatinine, Ser: 0.74 mg/dL (ref 0.4–1.2)
GFR calc Af Amer: >60 mL/min (ref >60)
GFR calc non Af Amer: >60 mL/min (ref >60)
Glucose, Bld: 81 mg/dL (ref 70–99)
Glucose, Bld: 88 mg/dL (ref 70–99)
Potassium: 2.9 mEq/L — ABNORMAL LOW (ref 3.5–5.1)
Sodium: 135 mEq/L (ref 135–145)

## 2010-07-13 LAB — CULTURE, BLOOD (ROUTINE X 2)
Culture: NO GROWTH
Culture: NO GROWTH

## 2010-07-13 LAB — CBC
HCT: 22.7 % — ABNORMAL LOW (ref 36.0–46.0)
HCT: 28.6 % — ABNORMAL LOW (ref 36.0–46.0)
Hemoglobin: 9.7 g/dL — ABNORMAL LOW (ref 12.0–15.0)
MCHC: 33.8 g/dL (ref 30.0–36.0)
MCHC: 34 g/dL (ref 30.0–36.0)
MCV: 90.6 fL (ref 78.0–100.0)
MCV: 90.8 fL (ref 78.0–100.0)
MCV: 91.4 fL (ref 78.0–100.0)
Platelets: 158 10*3/uL (ref 150–400)
Platelets: 263 10*3/uL (ref 150–400)
RBC: 3.36 MIL/uL — ABNORMAL LOW (ref 3.87–5.11)
RDW: 14.1 % (ref 11.5–15.5)
RDW: 14.8 % (ref 11.5–15.5)
WBC: 5.5 10*3/uL (ref 4.0–10.5)
WBC: 7 10*3/uL (ref 4.0–10.5)

## 2010-07-13 LAB — CLOSTRIDIUM DIFFICILE EIA
C difficile Toxins A+B, EIA: NEGATIVE
C difficile Toxins A+B, EIA: NEGATIVE

## 2010-07-13 LAB — HEMOCCULT GUIAC POC 1CARD (OFFICE): Fecal Occult Bld: NEGATIVE

## 2010-07-13 LAB — ALBUMIN: Albumin: 3.2 g/dL — ABNORMAL LOW (ref 3.5–5.2)

## 2010-07-13 LAB — PREALBUMIN: Prealbumin: 15.8 mg/dL — ABNORMAL LOW (ref 18.0–45.0)

## 2010-07-13 LAB — LACTIC ACID, PLASMA: Lactic Acid, Venous: 1.8 mmol/L (ref 0.5–2.2)

## 2010-07-17 LAB — GLUCOSE, CAPILLARY
Glucose-Capillary: 100 mg/dL — ABNORMAL HIGH (ref 70–99)
Glucose-Capillary: 112 mg/dL — ABNORMAL HIGH (ref 70–99)
Glucose-Capillary: 113 mg/dL — ABNORMAL HIGH (ref 70–99)
Glucose-Capillary: 116 mg/dL — ABNORMAL HIGH (ref 70–99)
Glucose-Capillary: 119 mg/dL — ABNORMAL HIGH (ref 70–99)
Glucose-Capillary: 121 mg/dL — ABNORMAL HIGH (ref 70–99)
Glucose-Capillary: 121 mg/dL — ABNORMAL HIGH (ref 70–99)
Glucose-Capillary: 122 mg/dL — ABNORMAL HIGH (ref 70–99)
Glucose-Capillary: 123 mg/dL — ABNORMAL HIGH (ref 70–99)
Glucose-Capillary: 130 mg/dL — ABNORMAL HIGH (ref 70–99)
Glucose-Capillary: 132 mg/dL — ABNORMAL HIGH (ref 70–99)
Glucose-Capillary: 137 mg/dL — ABNORMAL HIGH (ref 70–99)
Glucose-Capillary: 142 mg/dL — ABNORMAL HIGH (ref 70–99)
Glucose-Capillary: 148 mg/dL — ABNORMAL HIGH (ref 70–99)
Glucose-Capillary: 219 mg/dL — ABNORMAL HIGH (ref 70–99)
Glucose-Capillary: 236 mg/dL — ABNORMAL HIGH (ref 70–99)
Glucose-Capillary: 121 mg/dL — ABNORMAL HIGH (ref 70–99)
Glucose-Capillary: 122 mg/dL — ABNORMAL HIGH (ref 70–99)
Glucose-Capillary: 123 mg/dL — ABNORMAL HIGH (ref 70–99)
Glucose-Capillary: 133 mg/dL — ABNORMAL HIGH (ref 70–99)
Glucose-Capillary: 151 mg/dL — ABNORMAL HIGH (ref 70–99)
Glucose-Capillary: 171 mg/dL — ABNORMAL HIGH (ref 70–99)
Glucose-Capillary: 79 mg/dL (ref 70–99)
Glucose-Capillary: 83 mg/dL (ref 70–99)
Glucose-Capillary: 88 mg/dL (ref 70–99)
Glucose-Capillary: 89 mg/dL (ref 70–99)
Glucose-Capillary: 90 mg/dL (ref 70–99)

## 2010-07-17 LAB — CBC
HCT: 27.9 % — ABNORMAL LOW (ref 36.0–46.0)
HCT: 27.9 % — ABNORMAL LOW (ref 36.0–46.0)
HCT: 28.4 % — ABNORMAL LOW (ref 36.0–46.0)
HCT: 31 % — ABNORMAL LOW (ref 36.0–46.0)
HCT: 33.2 % — ABNORMAL LOW (ref 36.0–46.0)
HCT: 27.2 % — ABNORMAL LOW (ref 36.0–46.0)
HCT: 29.1 % — ABNORMAL LOW (ref 36.0–46.0)
Hemoglobin: 10.4 g/dL — ABNORMAL LOW (ref 12.0–15.0)
Hemoglobin: 10.6 g/dL — ABNORMAL LOW (ref 12.0–15.0)
Hemoglobin: 11.3 g/dL — ABNORMAL LOW (ref 12.0–15.0)
Hemoglobin: 12 g/dL (ref 12.0–15.0)
Hemoglobin: 9.3 g/dL — ABNORMAL LOW (ref 12.0–15.0)
Hemoglobin: 9.4 g/dL — ABNORMAL LOW (ref 12.0–15.0)
Hemoglobin: 9.5 g/dL — ABNORMAL LOW (ref 12.0–15.0)
Hemoglobin: 9.7 g/dL — ABNORMAL LOW (ref 12.0–15.0)
Hemoglobin: 9.1 g/dL — ABNORMAL LOW (ref 12.0–15.0)
MCHC: 32.9 g/dL (ref 30.0–36.0)
MCHC: 33.3 g/dL (ref 30.0–36.0)
MCHC: 33.4 g/dL (ref 30.0–36.0)
MCHC: 34.1 g/dL (ref 30.0–36.0)
MCV: 89.1 fL (ref 78.0–100.0)
MCV: 89.8 fL (ref 78.0–100.0)
MCV: 90.3 fL (ref 78.0–100.0)
MCV: 90.8 fL (ref 78.0–100.0)
MCV: 91.3 fL (ref 78.0–100.0)
Platelets: 180 10*3/uL (ref 150–400)
Platelets: 214 10*3/uL (ref 150–400)
Platelets: 313 10*3/uL (ref 150–400)
Platelets: 333 10*3/uL (ref 150–400)
RBC: 3 MIL/uL — ABNORMAL LOW (ref 3.87–5.11)
RBC: 3.11 MIL/uL — ABNORMAL LOW (ref 3.87–5.11)
RBC: 3.2 MIL/uL — ABNORMAL LOW (ref 3.87–5.11)
RBC: 3.45 MIL/uL — ABNORMAL LOW (ref 3.87–5.11)
RBC: 3.55 MIL/uL — ABNORMAL LOW (ref 3.87–5.11)
RBC: 3.66 MIL/uL — ABNORMAL LOW (ref 3.87–5.11)
RBC: 4 MIL/uL (ref 3.87–5.11)
RDW: 15.6 % — ABNORMAL HIGH (ref 11.5–15.5)
RDW: 15.6 % — ABNORMAL HIGH (ref 11.5–15.5)
RDW: 15.7 % — ABNORMAL HIGH (ref 11.5–15.5)
RDW: 16.2 % — ABNORMAL HIGH (ref 11.5–15.5)
WBC: 12.5 10*3/uL — ABNORMAL HIGH (ref 4.0–10.5)
WBC: 12.9 10*3/uL — ABNORMAL HIGH (ref 4.0–10.5)
WBC: 13.6 10*3/uL — ABNORMAL HIGH (ref 4.0–10.5)
WBC: 16.2 10*3/uL — ABNORMAL HIGH (ref 4.0–10.5)
WBC: 14.4 10*3/uL — ABNORMAL HIGH (ref 4.0–10.5)
WBC: 9.8 10*3/uL (ref 4.0–10.5)

## 2010-07-17 LAB — CROSSMATCH: ABO/RH(D): A POS

## 2010-07-17 LAB — BASIC METABOLIC PANEL
BUN: 2 mg/dL — ABNORMAL LOW (ref 6–23)
BUN: 5 mg/dL — ABNORMAL LOW (ref 6–23)
BUN: <1 mg/dL — ABNORMAL LOW (ref 6–23)
BUN: 5 mg/dL — ABNORMAL LOW (ref 6–23)
BUN: 6 mg/dL (ref 6–23)
CO2: 20 mEq/L (ref 19–32)
CO2: 29 mEq/L (ref 19–32)
CO2: 27 mEq/L (ref 19–32)
Calcium: 7.3 mg/dL — ABNORMAL LOW (ref 8.4–10.5)
Calcium: 8 mg/dL — ABNORMAL LOW (ref 8.4–10.5)
Chloride: 102 mEq/L (ref 96–112)
Chloride: 104 mEq/L (ref 96–112)
Chloride: 105 mEq/L (ref 96–112)
Chloride: 104 mEq/L (ref 96–112)
Chloride: 107 mEq/L (ref 96–112)
Creatinine, Ser: 0.59 mg/dL (ref 0.4–1.2)
GFR calc Af Amer: >60 mL/min (ref >60)
GFR calc Af Amer: >60 mL/min (ref >60)
GFR calc Af Amer: >60 mL/min (ref >60)
GFR calc non Af Amer: >60 mL/min (ref >60)
GFR calc non Af Amer: >60 mL/min (ref >60)
GFR calc non Af Amer: >60 mL/min (ref >60)
GFR calc non Af Amer: >60 mL/min (ref >60)
GFR calc non Af Amer: >60 mL/min (ref >60)
GFR calc non Af Amer: >60 mL/min (ref >60)
GFR calc non Af Amer: >60 mL/min (ref >60)
Glucose, Bld: 106 mg/dL — ABNORMAL HIGH (ref 70–99)
Glucose, Bld: 147 mg/dL — ABNORMAL HIGH (ref 70–99)
Glucose, Bld: 88 mg/dL (ref 70–99)
Glucose, Bld: 97 mg/dL (ref 70–99)
Potassium: 2.3 mEq/L — CL (ref 3.5–5.1)
Potassium: 2.5 mEq/L — CL (ref 3.5–5.1)
Potassium: 3 mEq/L — ABNORMAL LOW (ref 3.5–5.1)
Potassium: 3.2 mEq/L — ABNORMAL LOW (ref 3.5–5.1)
Potassium: 3.5 mEq/L (ref 3.5–5.1)
Potassium: 3.7 mEq/L (ref 3.5–5.1)
Potassium: 3.8 mEq/L (ref 3.5–5.1)
Potassium: 3.9 mEq/L (ref 3.5–5.1)
Potassium: 4.1 mEq/L (ref 3.5–5.1)
Sodium: 131 mEq/L — ABNORMAL LOW (ref 135–145)
Sodium: 135 mEq/L (ref 135–145)
Sodium: 136 mEq/L (ref 135–145)
Sodium: 136 mEq/L (ref 135–145)
Sodium: 137 mEq/L (ref 135–145)
Sodium: 139 mEq/L (ref 135–145)

## 2010-07-17 LAB — CULTURE, BLOOD (ROUTINE X 2)

## 2010-07-17 LAB — COMPREHENSIVE METABOLIC PANEL
ALT: 11 U/L (ref 0–35)
ALT: 13 U/L (ref 0–35)
AST: 16 U/L (ref 0–37)
Alkaline Phosphatase: 74 U/L (ref 39–117)
Alkaline Phosphatase: 89 U/L (ref 39–117)
CO2: 22 mEq/L (ref 19–32)
CO2: 24 mEq/L (ref 19–32)
Calcium: 8.6 mg/dL (ref 8.4–10.5)
GFR calc Af Amer: 55 mL/min — ABNORMAL LOW (ref >60)
GFR calc non Af Amer: >60 mL/min (ref >60)
Glucose, Bld: 136 mg/dL — ABNORMAL HIGH (ref 70–99)
Potassium: 2.7 mEq/L — CL (ref 3.5–5.1)
Potassium: 3.5 mEq/L (ref 3.5–5.1)
Sodium: 130 mEq/L — ABNORMAL LOW (ref 135–145)
Sodium: 131 mEq/L — ABNORMAL LOW (ref 135–145)
Total Protein: 6.6 g/dL (ref 6.0–8.3)

## 2010-07-17 LAB — CLOSTRIDIUM DIFFICILE EIA
C difficile Toxins A+B, EIA: NEGATIVE
C difficile Toxins A+B, EIA: NEGATIVE

## 2010-07-17 LAB — URINALYSIS, ROUTINE W REFLEX MICROSCOPIC
Hgb urine dipstick: NEGATIVE
Leukocytes, UA: NEGATIVE
Nitrite: NEGATIVE
Protein, ur: 30 mg/dL — AB
Urobilinogen, UA: 0.2 mg/dL (ref 0.0–1.0)

## 2010-07-17 LAB — HEMOCCULT GUIAC POC 1CARD (OFFICE): Fecal Occult Bld: NEGATIVE

## 2010-07-17 LAB — CK TOTAL AND CKMB (NOT AT ARMC): CK, MB: 0.4 ng/mL (ref 0.3–4.0)

## 2010-07-17 LAB — DIFFERENTIAL
Eosinophils Relative: 0 % (ref 0–5)
Lymphs Abs: 0.8 10*3/uL (ref 0.7–4.0)
Monocytes Absolute: 0.9 10*3/uL (ref 0.1–1.0)
Monocytes Relative: 7 % (ref 3–12)
Neutro Abs: 11.2 10*3/uL — ABNORMAL HIGH (ref 1.7–7.7)

## 2010-07-17 LAB — PROTIME-INR: Prothrombin Time: 17.7 seconds — ABNORMAL HIGH (ref 11.6–15.2)

## 2010-07-17 LAB — TROPONIN I: Troponin I: 0.01 ng/mL (ref 0.00–0.06)

## 2010-07-17 LAB — ABO/RH: ABO/RH(D): A POS

## 2010-07-17 LAB — URINE CULTURE

## 2010-07-17 LAB — STOOL CULTURE

## 2010-07-17 LAB — PREALBUMIN: Prealbumin: 10.5 mg/dL — ABNORMAL LOW (ref 18.0–45.0)

## 2010-07-17 LAB — MAGNESIUM: Magnesium: 1.6 mg/dL (ref 1.5–2.5)

## 2010-07-17 LAB — TYPE AND SCREEN: Antibody Screen: NEGATIVE

## 2010-07-17 LAB — URINE MICROSCOPIC-ADD ON

## 2010-07-18 LAB — COMPREHENSIVE METABOLIC PANEL
ALT: 17 U/L (ref 0–35)
AST: 18 U/L (ref 0–37)
Albumin: 3.8 g/dL (ref 3.5–5.2)
CO2: 24 mEq/L (ref 19–32)
Calcium: 9.3 mg/dL (ref 8.4–10.5)
Chloride: 102 mEq/L (ref 96–112)
GFR calc Af Amer: 30 mL/min — ABNORMAL LOW (ref >60)
GFR calc non Af Amer: 25 mL/min — ABNORMAL LOW (ref >60)
Sodium: 136 mEq/L (ref 135–145)
Total Bilirubin: 1.5 mg/dL — ABNORMAL HIGH (ref 0.3–1.2)

## 2010-07-18 LAB — CBC
Platelets: 219 10*3/uL (ref 150–400)
RBC: 4.25 MIL/uL (ref 3.87–5.11)
WBC: 18 10*3/uL — ABNORMAL HIGH (ref 4.0–10.5)

## 2010-07-18 LAB — DIFFERENTIAL
Eosinophils Absolute: 0 10*3/uL (ref 0.0–0.7)
Eosinophils Relative: 0 % (ref 0–5)
Lymphocytes Relative: 8 % — ABNORMAL LOW (ref 12–46)
Lymphs Abs: 1.4 10*3/uL (ref 0.7–4.0)
Monocytes Absolute: 0.9 10*3/uL (ref 0.1–1.0)

## 2010-07-18 LAB — LIPASE, BLOOD: Lipase: 17 U/L (ref 11–59)

## 2010-08-15 NOTE — H&P (Signed)
[name], [name]               ACCOUNT NO.:  [id number]   MEDICAL RECORD NO.:  [id number]          PATIENT TYPE:  INP   LOCATION:  1831                         FACILITY:  [facility]   PHYSICIAN:  Michiel Cowboy, MDDATE OF BIRTH:  [birth year]   DATE OF ADMISSION:  03/16/2008  DATE OF DISCHARGE:                              HISTORY & PHYSICAL   PRIMARY CARE PHYSICIAN:  Arta Silence, MD with [facility].   CHIEF COMPLAINT:  Nausea and diarrhea.   HISTORY OF PRESENT ILLNESS:  The patient is a 77 year old lady with  recent history of surgery for spinal canal stenosis and at discharge  developed diarrhea.  She presented to her primary care Twan Harkin who  first started her on some antibiotic, the name of which she cannot  remember.  She called him back within a few days, and she did not  getting any better, at which point he went ahead and started her on  vancomycin p.o. after doing some tests and telling her she has some sort  of viral illness.  I wonder if he actually meant C. difficile and if the  patient perhaps got confused.  She has been taking vancomycin and  continues to have still some diarrhea and nausea which brought her into  the emergency department.  In the ED, the patient noted to be clinically  dehydrated, at which point [facility] hospitalist was called for further  evaluation and treatment and admission.   REVIEW OF SYSTEMS:  Endorses some subjective fevers, but no chills,  nausea when eating and diarrhea, up to seven stools per day.  No  vomiting episodes.  Mild abdominal pain noted.  Otherwise, unremarkable  review of systems.  No chest pain or shortness of breath.   PAST MEDICAL HISTORY:  1. COPD.  2. History of lumbar spine stenosis.  3. Hypertension.  4. Diabetes.   SOCIAL HISTORY:  The patient used to smoke but currently quit.  Does not  use drugs.  Does not drink alcohol.   FAMILY HISTORY:  Noncontributory.   ALLERGIES:  IRON, MORPHINE,  PENICILLIN, STREPTOMYCIN.   MEDICATIONS:  1. Actos 50 mg p.o. daily.  2. Endocet 10/325 mg as needed.  3. Ramipril 10 mg daily.  4. Lyrica 50 mg p.o. b.i.d.  5. Vancocin 125 mg p.o. q.i.d.  6. Amlodipine/hydrochlorothiazide 5/50 mg p.o. daily.   PHYSICAL EXAMINATION:  VITAL SIGNS:  Temperature 97.4, blood pressure  120/69, pulse 114, now down to 101 after fluids, respirations 20, sating  95% on room air.  GENERAL:  The patient appears to be in no acute distress, sitting down  in bed.  HEAD:  Nontraumatic.  Very dry mucous membranes and decreased skin  turgor.  LUNGS:  Coarse breath sounds bilaterally, though no wheezes appreciated.  HEART:  Regular rate and rhythm.  No murmurs, rubs or gallops.  ABDOMEN:  Soft.  Mild left lower quadrant tenderness.  LOWER EXTREMITIES:  Without clubbing, cyanosis or edema.  Cranial nerves  intact, strength 5/5.   LABORATORY DATA:  White blood cell count elevated at 18.6, hemoglobin  12.6, sodium  137, potassium 4.1, creatinine 0.93, lipase 20.  UA showing  11-20 white blood cells.  Chest x-ray stable.  No infiltrate.   ASSESSMENT AND PLAN:  1. This is a 77 year old female with diarrhea, status post admission,      presumed C. difficile, but will send down for a C. difficile x2 as      well as stool culture and stool white blood cell count.  The      patient will continue for right now until p.o. vancomycin.  Would      recommend maybe GI consult if diarrhea persists, despite above-      mentioned treatment, in which case may consider needing vancomycin      enemas.  Maybe flexible sigmoidoscopy could determine where the      location of colitis is.  2. History of COPD.  Will make sure the patient has albuterol as      needed and scheduled Atrovent.  3. Diabetes.  Will do sliding scale.  Hold Actos while the patient not      taking good p.o.  4. Hypertension.  Will hold hydrochlorothiazide/amlodipine.  Will      continue Ramipril.  5.  Dehydration.  Will give IV fluids and follow orthostatics.  6. Prophylaxis.  Protonix and Lovenox.   The patient wished to be full code, but do not stay on life support for  prolonged period time.  Dr. Rene Paci or her partners to assume  care in the morning.  Michiel Cowboy, MD  Electronically Signed     AVD/MEDQ  D:  03/16/2008  T:  03/16/2008  Job:  [id number]   cc:   Arta Silence, MD

## 2010-08-15 NOTE — H&P (Signed)
NAMEBREANNAH, KRATT               ACCOUNT NO.:  [id number]   MEDICAL RECORD NO.:  [id number]          PATIENT TYPE:  INP   LOCATION:  3308                         FACILITY:  [facility]   PHYSICIAN:  Payton Doughty, M.D.      DATE OF BIRTH:  22-Sep-1931   DATE OF ADMISSION:  06/27/2007  DATE OF DISCHARGE:                              HISTORY & PHYSICAL   ADMISSION DIAGNOSIS:  Spondylolisthesis of L4-5 with neurogenic  claudication.    A 77 year old right-handed white lady who has been going to pain clinic  for a couple years, had pain in her back to the point where she is on  100 mcg of Fentanyl every other day via patch and still having pain back  her buttocks.  Notes bladder having some difficulty as well.  She also  had pain neck going down her shoulder arms.  Notes after test when she  is lying down, she had some numbness her hands.   MEDICAL HISTORY:  Remarkable for adult-onset diabetes mellitus.   MEDICATIONS:  Analor 5/50 b.i.d. for blood pressure.  Duragesic patch  100 mcg every other day.  Hydrocodone 75 mg b.i.d. Actos 15 mg a day,  and ramipril 10 mg a day for blood pressure.   ALLERGIES:  PENICILLIN.  MYCIN.  ASPIRIN.  They cause her to break out.   SURGICAL HISTORY:  Right shoulder replacement, left shoulder  replacement, hysterectomy.   SOCIAL HISTORY:  She smokes 1 to 2 cigarettes a day, does not drink  alcohol.  Works as a Engineer, maintenance.   FAMILY HISTORY:  Mom 26 good health with memory loss.  Dad deceased  history is not given.   REVIEW OF SYSTEMS:  Marked for glasses, hypertension, COPD, back pain,  arm pain, joint pain, neck pain and diabetes.   Her HEENT exam normal limits.  She has reasonable range of motion neck  without Lhermitte's phenomenon.  CHEST:  Clear.  CARDIAC:  Regular rate and rhythm.  She limited range right shoulder  after replacement.  Left 1 seems work pretty well.  Abdomen was on tender.  No hepatosplenomegaly.  EXTREMITIES: No  clubbing, cyanosis.  GU: Deferred.  Peripheral pulses are good.  NEUROLOGICALLY:  She is awake, alert and oriented.  Cranial nerves are  intact.  Motor exam shows 5/5 strength throughout the upper and lower  extremities save for dorsiflexion and weakness bilaterally.  Intense  pain in the buttocks when she stands up.  Extended reflexes are absent  knees and ankles.   MR demonstrates grade 1 slip at 4 on 5 with severe stenosis secondary to  spondylosis.   CLINICAL IMPRESSION:  Critical spinal stenosis L4-5 secondary to  degeneration spondylolisthesis.   PLAN:  For a decompression and fusion at 4-5.  The risks and benefits  have been discussed with her.  She wished to proceed.           ______________________________  Payton Doughty, M.D.     MWR/MEDQ  D:  06/27/2007  T:  06/28/2007  Job:  [id number]

## 2010-08-15 NOTE — Op Note (Signed)
[name], [name]               ACCOUNT NO.:  [id number]   MEDICAL RECORD NO.:  [id number]          PATIENT TYPE:  INP   LOCATION:  3311                         FACILITY:  [facility]   PHYSICIAN:  Payton Doughty, M.D.      DATE OF BIRTH:  1932/02/27   DATE OF PROCEDURE:  02/24/2008  DATE OF DISCHARGE:                               OPERATIVE REPORT   PREOPERATIVE DIAGNOSIS:  Spondylosis and transitional segment disease at  L3-4.   POSTOPERATIVE DIAGNOSIS:  Spondylosis and transitional segment disease  at L3-4.   OPERATIVE PROCEDURE:  L3-4 anterolateral interbody fusion and lateral  arthrodesis with a plate using XLIF.   SURGEON:  Payton Doughty, MD   ANESTHESIA:  General endotracheal.   PREPARATION:  Prepped and draped with alcohol wipe.   COMPLICATIONS:  None.   NURSE ASSISTANT:  Bedelia Person, MD   DOCTOR ASSISTANT:  Danae Orleans. Venetia Maxon, MD   BODY OF TEXT:  This is a 77 year old lady with transitional segment  disease at L3-4 taken to the operating room, smoothly anesthetized and  intubated, placed right side down and left side up in the lateral  decubitus position.  AP localization was ascertained with fluoro.  Shoulders were padded as they had both been replaced.  Following shave,  prep and drape in the usual sterile fashion, two incisions were made  approximately 3 cm long on the left side, one posterolaterally to allow  access to the finger into the retroperitoneal space and then straight  laterally through the working channel.  The electronic one was  introduced through the lateral incision lower down to the psoas and  under stimulation was advanced to the docking point on the lateral  aspect of the L3-4 interspace.  At no time was any evidence of plexus  involvement noted.  Successive dilation was carried out and retractor  placed.  Shim set in the usual fashion.  Retractor was locked down and  opened.  Working through the retractor, the iliopsoas muscle was taken  down off the  osteophyte and lateral portion of the disk space.  The disk  space was then opened, diskectomy carried out in place, rasped and  cleaned.  A 10 x 50-mm interbody device was packed with bone graft and  tapped into place.  Fluoro showed good placement of the cage.  The bolts  for the lateral plate were then placed using the guide and the plate  attached to the bone.  The final films showed good placement of graft,  endplate, and interbody device.  Both incisions were closed with 2-0  Vicryl and 3-0 nylon.  Betadine and Telfa dressings were applied and  made occlusive with OpSite.  The patient returned to the recovery room  in good condition.          ______________________________  Payton Doughty, M.D.    MWR/MEDQ  D:  02/24/2008  T:  02/25/2008  Job:  [id number]

## 2010-08-15 NOTE — Discharge Summary (Signed)
[name], WEAKLAND               ACCOUNT NO.:  [id number]   MEDICAL RECORD NO.:  [id number]          PATIENT TYPE:  INP   LOCATION:  5507                         FACILITY:  [facility]   PHYSICIAN:  Bruce Rexene Edison. Swords, MD    DATE OF BIRTH:  1931-06-18   DATE OF ADMISSION:  03/16/2008  DATE OF DISCHARGE:  03/27/2008                               DISCHARGE SUMMARY   DISCHARGE DIAGNOSES:  1. Clostridium difficile colitis.  2. History of chronic obstructive pulmonary disease.  3. Diabetes.  4. Hypertension.  5. Dehydration, resolved.  6. Urinary tract infection.  7. Anemia, requiring transfusion.  8. Hypokalemia, resolved.   DISCHARGE MEDICATIONS:  1. Endocet 10/325 one p.o. q.6 h. as needed for pain.  2. Ramipril 10 mg p.o. daily.  3. Lyrica 50 mg p.o. b.i.d.  4. Amlodipine 5 mg p.o. daily.  5. Hydrochlorothiazide 50 mg p.o. daily.  6. Omeprazole 20 mg p.o. daily.  7. Metronidazole 500 mg p.o. t.i.d. for 5 days.  8. Cholestyramine 4 g 2 times daily.   CONDITION ON DISCHARGE:  Improved, no diarrhea.  The patient ambulating  without difficulty, the patient tolerating a diet.   FOLLOWUP PLANS:  Arta Silence, MD, March 30, 2008, at 12:15  p.m.   HOSPITAL LABORATORY AT DISCHARGE:  Hemoglobin 10.2.  C. diff toxin  remains negative.  Fecal occult blood positive.  Urine culture positive  for 100,000 E. coli.   HOSPITAL COURSE:  The patient was admitted to the Hospitalist Service on  March 16, 2008.  See admission note for details.  The patient  presented with profound diarrhea and dehydration.  This occurred in the  context of recent surgery for spinal stenosis and recent antibiotic use.  The patient continued with diarrhea, was treated with metronidazole and  vancomycin.  The patient is seen in consultation by GI for anemia and C.  diff colitis.  Recommendation was to continue with metronidazole.  She  had thorough evaluation for anemia.  The patient and Gastroenterology  elected not to pursue further evaluation due to low likelihood of  diagnosis.   Anemia:  The patient is transfused 2 units of packed red blood cells,  improved dramatically.   Hypokalemia:  Resolved after treatment.   Diabetes:  The patient came in on Actos.  Blood sugars have been well  controlled.  She will be discharged without any diabetic medications.   At this time, the patient is tolerating a diet and ambulating without  difficulty.  She is stable for discharge.   Urinary tract infection:  Treated with 3 days of ceftriaxone.   Greater than 30 minutes was spent on discharge planning.      Bruce Rexene Edison Swords, MD  Electronically Signed     BHS/MEDQ  D:  03/27/2008  T:  03/27/2008  Job:  [id number]

## 2010-08-15 NOTE — Discharge Summary (Signed)
[name], [name]               ACCOUNT NO.:  [id number]   MEDICAL RECORD NO.:  [id number]          PATIENT TYPE:  INP   LOCATION:  1502                         FACILITY:  [facility]   PHYSICIAN:  Rosalyn Gess. Norins, MD  DATE OF BIRTH:  [birth year]   DATE OF ADMISSION:  05/30/2008  DATE OF DISCHARGE:  06/07/2008                               DISCHARGE SUMMARY   ADMITTING DIAGNOSIS:  Recurrent diarrhea.   DISCHARGE DIAGNOSIS:  C. difficile colitis with flexible sigmoidoscopy  for confirmation.   PROCEDURES:  1. CT of the abdomen and pelvis without contrast performed May 31, 2008, which revealed persistent wall thickening involving the left      colon.  Although pericolic stranding was decreased, findings remain      concerning for colitis.  2. CT of the pelvis with diffuse thickening of the sigmoid colon      similar to previous examination.  3. Flexible sigmoidoscopy performed June 04, 2008, which showed the      patient to have scattered pseudomembranous changes and diverticular      changes.   CONSULTANTS:  1. Dr. [name] for gastroenterology.  2. Dr. Ninetta Lights for infectious disease.   HISTORY OF THE PRESENT ILLNESS:  Patient is a 77 year old woman with a  history of diabetes and hypertension.  Patient did develop diarrhea  following surgical hospitalization and procedure with the diagnosis of  C. difficile colitis.  She was treated with oral medications but did  require hospitalizations on 2 occasions for severe diarrhea and  dehydration.  Patient completed her last dose of oral vancomycin on the  day of admission and noted that her diarrhea had returned.  She reported  feeling feverish.  She had crampy left lower quadrant abdominal pain  which was intermittent.  She presented to the emergency department and  was started on Zofran and Flagyl IV and stool specimens were sent.   Please see the H and P for past medical history, family history, social  history, and  physical exam at admission.   HOSPITAL COURSE:  1. GI:  Patient with presumed recurrent C. difficile colitis.  During      her hospitalization, she had stool sent for C. diff that was      negative x3.  She was seen in consultation by the infectious      disease service initially by Dr. Orvan Falconer and then followed by Dr.      Ninetta Lights.  It was recommended that she be restarted on oral      vancomycin, that metronidazole be discontinued, that Florastor be      started.  On this regimen, the patient continued to have diarrhea.      She was seen in consultation by the GI service.  They recommended      adding Xifaxan 400 mg t.i.d. to her regimen.  On this regimen, the      patient continued to have diarrhea although it slowed      significantly.  Patient did come to flexible sigmoidoscopy to      confirm diagnosis  given that she was C. diff negative, probably      representing a toxin-negative strain of C. diff.  Patient's flex      sig results are as above.  Patient's diarrhea did slow down.  She      remained well hydrated.  Her laboratory remained unremarkable      otherwise and it was thought she could be discharged home to      complete an oral regimen which would include Xifaxan t.i.d. for 10      additional days, Flora-Q b.i.d. for 1 month, vancomycin 250 mg      q.i.d. for 14 days, then b.i.d. for 14 days, then daily for 14      days.  Patient was informed of this regimen.  She is willing to      follow this as an outpatient.  2. Anemia.  Patient had a slow drift down in her hemoglobin to a low      of 9.7 g.  She had no overt signs of GI bleeding.  She did not      require transfusion.  Her hemoglobin did improve over the next      several days so that at the time of discharge her final CBC      revealed a hemoglobin of 11.4 g.   Patient's other medical problems remained stable.  She was continued on  her home medications and did well.   DISCHARGE EVALUATION:  Temperature of  97.8.  Blood pressure 114/67.  Heart rate was 70.  Respirations were 16.  O2 saturations 95% on room  air.  CBG was 116.  GENERAL APPEARANCE:  This is a pleasant woman sitting on the side of the  bed eating her breakfast in no distress.   No physical exam was otherwise conducted due to complete physical exams  over the last several days.   FINAL LABORATORY:  From June 06, 2008, hemoglobin 11.4 g, white count  was 7200.  Final chemistries from June 05, 2008, with a sodium of 137,  potassium 3.8, chloride of 104, CO2 of 28, BUN of 13, creatinine 0.8,  glucose was 93.  Stool for C. diff toxin was negative x3.  Blood  cultures were negative with no growth at 5 days.  Please see above for  radiographic studies.   DISPOSITION:  Patient is stable to be discharged home.   DISCHARGE MEDICATIONS:  Include:  1. Vancomycin 250 mg q.i.d. for 14 days, then b.i.d. for 14 days, then      daily for 14 days.  2. Lexapro 10 mg daily.  3. Florastor 500 mg b.i.d.  4. Lyrica 50 mg b.i.d.  5. Prozac 10 mg daily.  6. Xifaxan 400 mg t.i.d. for an additional 10 days.  7. Klonopin 1 mg q.h.s.  8. Altace 10 mg daily.  9. Protonix 40 mg b.i.d.  10.Maxzide 75/50 once daily.  11.Potassium 20 mEq twice a day.  12.Questran 4 g daily.  13.Tylenol p.r.n.  14.Percocet 5/325 one q.6 p.r.n.  15.Oxycodone 5 mg q.4 p.r.n.  16.Levsin 0.375 mg q.4 p.r.n.  17.Dulcolax p.r.n.  18.Ventolin inhaler p.r.n.[name]   Patient is discharged to home.  She will follow up with Dr. [name] in 3 to 4 [place]ille[place] for followup.  She will be referred back to GI  on an as-needed basis.   PATIENT'S CONDITION AT TIME OF DISCHARGE DICTATION:  Stable.      Rosalyn Gess Norins, MD  Electronically Signed  MEN/MEDQ  D:  06/07/2008  T:  06/07/2008  Job:  [id number]   cc:   Hedwig Morton. Juanda Chance, MD  [address]  [place]  [place] [address]   Arta Silence, MD  Fax: [phone]

## 2010-08-15 NOTE — Discharge Summary (Signed)
[name], [name]               ACCOUNT NO.:  [id number]   MEDICAL RECORD NO.:  [id number]          PATIENT TYPE:  INP   LOCATION:  3007                         FACILITY:  [facility]   PHYSICIAN:  Payton Doughty, M.D.      DATE OF BIRTH:  [birth year]   DATE OF ADMISSION:  06/27/2007  DATE OF DISCHARGE:  07/02/2007                               DISCHARGE SUMMARY   ADMITTING DIAGNOSIS:  Spondylosis with spondylolisthesis L4-L5.   DISCHARGE DIAGNOSIS:  Spondylosis with spondylolisthesis L4-L5.   PROCEDURE:  L4-L5 laminectomy, diskectomy, posterior lumbar interbody  fusion, nonsegmental pedicle screw fixation, posterior arthrodesis.   COMPLICATIONS:  None.   DISCHARGE STATUS:  Alive and well.   A 77 year old right handed white girl whose history and physical is  recounted in the chart.  She has been having pain for some time and  difficulty with bladder and neurogenic claudication.  MRI showed  significant stenosis at L4-L5.  She was admitted after ascertaining  normal laboratory values and underwent fusion at L4-L5.  Postoperatively, she has done well.  She was observed in ICU for a  couple of days because of her age, had no difficulty there, was able to  be weaned off PCA.  Now has virtually no complaints of leg pain, is up  doing well with her brace, has passed physical therapy with flying  colors.  Strength is full.  Incision is dry and well healing.  She is  being discharged home.  She knows to taper her fentanyl patch.  She was  seen by rehab who of course felt that she did not need to stay.  She has  been given Percocet for pain.  Her followup will be in the [facility]  offices in about 10 days for suture removal.           ______________________________  Payton Doughty, M.D.     MWR/MEDQ  D:  07/02/2007  T:  07/02/2007  Job:  [id number]

## 2010-08-15 NOTE — H&P (Signed)
[name], [name]               ACCOUNT NO.:  [id number]   MEDICAL RECORD NO.:  [id number]          PATIENT TYPE:  INP   LOCATION:  1424                         FACILITY:  [facility]   PHYSICIAN:  Michiel Cowboy, MDDATE OF BIRTH:  [birth year]   DATE OF ADMISSION:  04/07/2008  DATE OF DISCHARGE:                              HISTORY & PHYSICAL   PRIMARY CARE Goldye Tourangeau:  Arta Silence, M.D. in [place].   CHIEF COMPLAINT:  Recurrent diarrhea and lightheadedness.   HISTORY OF THE PRESENT ILLNESS:  The patient is a 77 year old female  that has been having recurrent bouts of diarrhea, which were presumed to  be C difficile although she actually, in the E chart, never had a  positive C difficile culture.  I am not sure if she had it as an  outpatient though.  She apparently has completed a course of by mouth  vancomycin and also Flagyl, and was discharged to home, I believe, on  March 27, 2008; and, about a week ago or so finished her  metronidazole.  At first she was doing much better, but then 3 days ago  her diarrhea resumed and became again severe.  She also had some nausea,  vomiting and fevers x2 days.  She is febrile in the ED up to 102.5.  She  is still having abdominal pain, which is somewhat mild and has a lot of  cramping.  She denies blood per rectum, but still has frequent loose  bowel movements.  Otherwise the patient sustained a fall earlier today  in the shower.  She bumped her elbow slightly, but did not hit her head.   REVIEW OF SYSTEMS:  Otherwise the review of systems is unremarkable.  She has been a little bit unsteady and lightheaded when she tries to  walk, and feels very dehydrated.  No chest pain.  No shortness of  breath.  Positive for fevers.  No constipation.  There is a lot of  diarrhea.   PAST MEDICAL HISTORY:  Otherwise the past medical history is significant  for:  1. Diet-controlled diabetes mellitus.  2. Hypertension.  3. Recent urinary  tract infection.  4. Anemia requiring transfusion.  5. Hypokalemia in the past.  6. History of COPD.  7. Presumed Clostridium difficile colitis.  8. Partial pancreatic dysfunction.   SOCIAL HISTORY:  The patient used to smoke, but not currently.  She does  not use drugs.  She does not drink alcohol.  She lives at home and has a  very caring family who helps her a lot.   FAMILY HISTORY:  The family history is noncontributory.   ALLERGIES:  ALLERGIC TO IRON, MORPHINE, PENICILLIN AND STREPTOMYCIN.   MEDICATIONS:  1. Amiloride/hydrochlorothiazide 5/50 mg twice a day.  2. Lyrica 50 mg twice a day.  3. Endocet 10/325 as needed.  4. The patient has been taking Lomotil as well as atropine for      diarrhea.  5. Ramipril 10 mg a day.  6. Cholestyramine 5 grams twice a day, which she takes for possible      pancreatic dysfunction.  7. Omeprazole 20 mg a day.  8. Flagyl, which she finished about a week or so ago.   PHYSICAL EXAMINATION:  VITAL SIGNS:  Temperature 102.5, blood pressure  129/58, pulse 116, respirations 20, and satting at 95% on room air.  GENERAL APPEARANCE:  The patient appears to be very dry with dry mucous  membranes, and decreased skin turgor.  HEENT:  Head is atraumatic.  LUNGS:  The lungs have distant breath sounds bilaterally, but no  crackles or wheezes.  HEART:  The heart is rapid, but regular.  No murmurs appreciated.  ABDOMEN:  The abdomen is soft, but there is mild left lower quadrant  tenderness.  No acute abdominal signs noted.  EXTREMITIES:  The lower extremities are without clubbing, cyanosis or  edema.  Strength is 5/5.  NEUROLOGIC EXAMINATION:  Neurologically she is otherwise intact.   LABORATORY DATA:  White blood cell count 12.9.  Sodium 130, potassium  2.7 and creatinine 1.16.  Total bili 1.4.  Lipase 14.  Blood cultures  are pending.  Hemoccult negative.  Chest x-ray showed no cardiopulmonary  disease, which is unchanged from prior.  There are no  changes, except  for maybe a small lingual atelectasis and a small amount of fluid the  fissure.  KUB showed no dilated bowel and no acute changes.  EKG showed  normal sinus rhythm, heart rate of 100, left ventricular hypertrophy and  there is T wave inversion in lead 1, aVL, V5, V2, and V6; but, is  otherwise unremarkable.  There was no evidence of ischemic changes;  however, questionably there may be Q waves in lead V4.   ASSESSMENT AND PLAN:  This is a 77 year old female with presumptive  colitis.  The patient was supposed to have a colonoscopy done tomorrow.  We are still trying to figure out if she has Clostridium difficile  colitis or not after stopping metronidazole.  The patient, as mentioned,  has  recurrence of her diarrhea.   1. Presumptive colitis with continuous diarrhea.  We will restart      Methacin.  I strongly suggest a gastrointestinal consult.  We sent      stool for Clostridium difficile.  We will send for ova and      parasites and stool culture again.  I feel like she will need      either a flexible sigmoidoscopy or a colonoscopy to try to      determine a definitive cause of this diarrhea that seems like, so      far, it gets better on an  antibiotic and gets worse off the      antibiotic.  We will also order a computerized tomographic scan of      the abdomen given her fevers,  which could be secondary to      Clostridium difficile, but also given some localized tenderness we      want to make sure that we do not miss something like an abscess or      diverticulitis.  2. Dehydration.  I will give her intravenous fluids.  Check      orthostatics.  3. Chronic obstructive pulmonary disease.  As needed albuterol and      Atrovent.  This appears to be currently stable.  4. Hypertension.  We will give ramipril and amlodipine with holding      parameters.  5. Decreased potassium likely secondary to diarrhea.  We will replace      and watch on telemetry.  6.  Prophylaxes.  Protonix plus Lovenox.  7. Code Status.  The patient is Do Not Resuscitate/Do Not      Intubate as was discussed with her and her family.  Dr. Rene Paci and/or her partners will resume care in the A.M.      Michiel Cowboy, MD  Electronically Signed     AVD/MEDQ  D:  04/08/2008  T:  04/08/2008  Job:  [id number]

## 2010-08-15 NOTE — H&P (Signed)
[name], Delacruz               ACCOUNT NO.:  [id number]   MEDICAL RECORD NO.:  [id number]          PATIENT TYPE:  INP   LOCATION:  1234                         FACILITY:  [facility]   PHYSICIAN:  Darryl D. Prime, MD    DATE OF BIRTH:  06-Aug-1931   DATE OF ADMISSION:  05/30/2008  DATE OF DISCHARGE:                              HISTORY & PHYSICAL   PRIMARY CARE PHYSICIAN:  [place].   GI PHYSICIAN:  [facility].   CHIEF COMPLAINT:  Diarrhea again.   HISTORY OF PRESENT ILLNESS:  [name] is a 77 year old female with a  history of diabetes and hypertension, history of Clostridium difficile  colitis requiring hospitalization on two occasions, severe (the most  recent admission was in January of 2010), who presents with diarrhea.  She took her last dose of vancomycin p.o. on the day of admission and  notes she has had diarrhea since discharge from the hospitalization in  January, but very severe diarrhea over the last 24 hours.  The patient  notes she has been feeling feverish.  She has had crampy left lower  quadrant abdominal pain intermittently, and her stools smell like  Clostridium difficile.  She has had no p.o. intake over the last 2 days  because of significant nausea and diarrhea.  In the emergency room, the  patient was given Zofran and Flagyl IV.  Clostridium difficile and stool  cultures were sent.   PAST MEDICAL AND PAST SURGICAL HISTORY:  1. As above.  2. She has a history of COPD.  3. History of anemia of chronic disease.  4. History of hypokalemia.  5. Recent fusions of L3-L4 in February of 2010.  6. History of foot surgery.  7. History of malnutrition secondary to Cdiff.  8. History of UTI.  9. History of chronic lower back pain.  10.History of partial pancreatic dysfunction.   ALLERGIES:  1. IRON.  2. MORPHINE.  3. PENICILLIN.  4. STREPTOMYCIN.   MEDICATIONS:  1. Lyrica 50 mg twice a day.  2. Ramipril .  3. Lexapro 10  mg daily.  4. Actos 50 mg daily.  5. She was on a vancomycin taper that finished today.  6. She was taking an over-the-counter medication for her bowel; she is      unsure of exactly what it was.   SOCIAL HISTORY:  Prior tobacco use.  No chronic alcohol or drug use.  Lives at home.   FAMILY HISTORY:  Noncontributory.   REVIEW OF SYSTEMS:  A 14-point review of systems negative, unless stated  above.   PHYSICAL EXAMINATION:  VITAL SIGNS:  Temperature is 97.4, blood pressure  initially 97/50, pulse of 104, respiratory rate of 14, saturations 95%  on room air.  GENERAL:  She is a chronically ill-appearing female lying flat in bed in  no acute distress.  HEENT:  Normocephalic and atraumatic.  Pupils equal, round and reactive  to light.  Extraocular movements are intact.  Oropharynx is very dry.  NECK:  Supple with no lymphadenopathy, no thyromegaly, no carotid  bruits, no jugular venous distention.  LUNGS:  Clear to auscultation bilaterally.  CARDIAC:  Regular rate, no murmurs.  ABDOMEN:  Tender diffusely to palpation in the right lower quadrant.  No  rebound tenderness.  No hepatosplenomegaly.  EXTREMITIES:  Without clubbing, cyanosis, or edema.  NEUROLOGIC:  Alert and oriented x4.  Cranial nerves II-XII grossly  intact.  Strength and sensation grossly intact.   LABORATORY DATA:  White count 18, hemoglobin 13, hematocrit 38.7,,  platelets 219.  Glucose 187.  Lipase 17.  Sodium 132, potassium 3.4,  chloride 102, bicarbonate 24, BUN 39, creatinine 1.97, increased from  0.7.  Glucose 187.  Total bilirubin 0.5.   History of Clostridium difficile, apparently having  flare Clostridium  difficile after ending the vancomycin taper.  Will admit her and place  her on p.o. vancomycin four times a day, as well as p.o. Flagyl and have  GI Medicine see her. IV fluids. She will be admitted to the step down  unit given elevated white count and hypotension, tachycardia.      Darryl D. Prime,  MD  Electronically Signed     DDP/MEDQ  D:  05/31/2008  T:  05/31/2008  Job:  [id number]

## 2010-08-15 NOTE — Op Note (Signed)
[name], [name]               ACCOUNT NO.:  [id number]   MEDICAL RECORD NO.:  [id number]          PATIENT TYPE:  INP   LOCATION:  2855                         FACILITY:  [facility]   PHYSICIAN:  Payton Doughty, M.D.      DATE OF BIRTH:  06-18-1931   DATE OF PROCEDURE:  06/27/2007  DATE OF DISCHARGE:                               OPERATIVE REPORT   PREOPERATIVE DIAGNOSIS:  Spondylosis with degenerative  spondylolisthesis, L4-5.   POSTOPERATIVE DIAGNOSIS:  Spondylosis with degenerative  spondylolisthesis, L4-5.   PROCEDURE:  L4-5 laminectomy, diskectomy, posterior lumbar antibody  fusion PEEK cages, nonsegmental pedicle screw fixation and  posterolateral arthrodesis.   SURGEON:  Payton Doughty, M.D.   ANESTHESIA:  General endotracheal.   PREP:  Betadine prep,  plus alcohol wipe.   COMPLICATIONS:  None.   ASSISTANT:  [place].   DESCRIPTION OF PROCEDURE:  A 78 year old lady with severe neurogenic  claudication, spondylolisthesis at 4-5, taken to the operating room,  ____________  intubated, placed prone on the operating table following  shave, prep, and drape in the usual sterile fashion.  Skin was  infiltrated with 1% lidocaine with 1:100,000 epinephrine.  The skin was  incised from the middle of L3 to the middle of L5, and the lamina of L3,  L4 andL5 were exposed bilaterally in subperiosteal plane.  Several x-  rays were taken to make certain of the correctness of the level.  Following ascertaining the correctness of level, the pars reticularis  lamina and inferior facet of L4 was removed bilaterally using a high-  speed drill.  The superior facet of L5 was also removed using the high-  speed drill.  This allowed decompression of the lateral recess and  removal of the ligamentum flavum, which facilitated decompression of the  central canal.  The 4 and 5 roots were explored and found to be free  after this was done.  A reasonable reduction of the slip was obtained.  Then 10 mm  cages were packed with bone graft harvested from the facet  joint and tapped into place.  Pedicle screws were placed at L4 and L5  using the standard landmarks.  Intraoperative x-ray showed good  placement of the screws.  They were attached to rods and capped.  EMP on  the extender matrix was placed over the transverse processes after  decortication as an intertransverse graft.  X-ray showed good placement  of bone graft, cages, screws and rods.  Final tightener was applied and  successive layers of 0 Vicryl, 2-0 Vicryl, 3-0 nylon were used to close.  Betadine, Telfa dressing was applied and an occlusive with OpSite, and  the patient returned to the recovery room in good condition.          ______________________________  Payton Doughty, M.D.    MWR/MEDQ  D:  06/27/2007  T:  06/28/2007  Job:  [id number]

## 2010-08-15 NOTE — Discharge Summary (Signed)
[name], [name]               ACCOUNT NO.:  [id number]   MEDICAL RECORD NO.:  [id number]          PATIENT TYPE:  INP   LOCATION:  1536                         FACILITY:  [facility]   PHYSICIAN:  [name], MDDATE OF BIRTH:  1932-01-21   DATE OF ADMISSION:  04/08/2008  DATE OF DISCHARGE:  04/22/2008                               DISCHARGE SUMMARY   DISCHARGE DIAGNOSES:  1. Severe and recurrent Clostridium difficile colitis, symptoms      improved.  Continue prolonged p.o. vancomycin taper with other      medical management as described below, improved.  2. Debilitation secondary to prolonged recurrent and severe illness      for short-term skilled nursing facility.  Medically stable for      discharge.  3. Hypokalemia secondary to gastrointestinal loss, resolved.  4. Malnutrition secondary to malabsorption with Clostridium difficile.      P.O. supplements encouraged with p.o. intake.  Outpatient follow up      as needed.  5. Chronic obstructive pulmonary disease, stable.  No oxygen needs      prior to admission.  6. Hypertension.  7. Anemia of chronic disease.  Discharge hemoglobin 9.1.  8. Type 2 diabetes.  9. Dehydration on admission due to diarrhea, resolved.  10.Status post mild volume overload with treatment of dehydration,      resolved, now euvolemic.  11.Chronic low back pain with chronic narcotic and Lyrica use.      Outpatient follow up with Dr. Channing Delacruz as prior to admission as needed.      Continue back brace as needed.   CONSULTS THIS HOSPITALIZATION:  [facility].   PROCEDURES:  A flexible sigmoidoscopy on April 12, 2008 by Dr. [name] showing changes of pseudomembranous colitis.   DISCHARGE MEDICATIONS:  1. Oral vancomycin 250 mg p.o. q.i.d. for 14 days.  Continue for 7      days following discharge, then vancomycin 250 mg t.i.d. x7 days,      then vancomycin 250 mg b.i.d. x7 days, then vancomycin 250 mg daily      x7 days.  2. Florastor  supplement 500 mg p.o. b.i.d.  3. Xifaxan 400 mg p.o. t.i.d. for 14 days total.  Continue 7 days more      following discharge.  4. Lomotil 1 tablet p.o. t.i.d. scheduled; may hold for constipation.  5. Anusol-HC 2.5% cream, apply to anus t.i.d. p.r.n. irritation.  Other medications at discharge are as follows:  1. Norvasc 5 mg p.o. daily.  2. Protonix 40 mg p.o. daily.  3. Symbicort one inhalation b.i.d.  4. Lopressor 25 mg p.o. b.i.d.  5. Sliding scale insulin.  6. DuoNebs q.4h. while awake.  7. Lyrica 50 mg p.o. b.i.d.  8. Percocet 5/325 one p.o. q.4h. p.r.n. moderate to severe pain.  9. Tylenol 650 mg p.o. q.4h. p.r.n. pain or fever.  10.Xanax 0.5 mg 1-2 tablets p.o. q.4h. p.r.n. anxiety.  (The patient is instructed to hold her Altace 10 mg daily until further  follow up with primary care physician.)   CONDITION ON DISCHARGE:  Medically improved, profoundly debilitated,  hemodynamically stable.   HOSPITAL FOLLOW UP:  1. With primary care physician, Dr. Laurita Delacruz, at [facility]      to be arranged in the next 2-4 Gros.  2. With [facility] to be arranged in 2-3 [place] following discharge.   HOSPITAL COURSE BY PROBLEM:  1. Recurrent Clostridium difficile colitis.  The patient is a pleasant      but chronically ill 77 year old woman who came to the emergency      room the day of admission due to continued recurrence of diarrhea,      associated with lightheadedness and fever with temperature of 102.5      in the emergency room, with recent history of Clostridium difficile      colitis and antibiotic treatment for urinary tract infection.  She      was admitted for presumed recurrence of Clostridium difficile and      begun on antibiotic therapy.  For this, she was seen in      consultation by [facility] the day of admission who agreed with      treatment of IV fluids, rectal pouch and oral vancomycin, as well      as IV Flagyl.  She was placed on Florastor supplements,  and as her      symptoms were slow to resolve despite improvement in her white      count and decrease in her temperature, she was scheduled for a      flexible sigmoidoscopy to confirm the diagnosis.  Toxins did come      back with positive Clostridium difficile, and flexible      sigmoidoscopy did confirm pseudomembranous changes on April 12, 2008.  For symptomatic control, Questran was added to her regimen,      and she was treated aggressively with IV fluids and electrolyte      replacement including potassium supplementation IV and orally as      tolerated.  The patient's course was slow to improve, and other      supplements were tried for nutrition, as well as the addition of      Xifaxan and Welchol, and eventually Lomotil for control of her      diarrhea symptoms.  She has been afebrile with normalization of her      white count and stabilization of her hemodynamics she was treated      with IV fluids for volume depletion due to diarrhea, and then a one-      time dose of Lasix mid hospitalization due to volume overload with      pulmonary symptoms.  At the time of discharge, she is having two to      three stools a day which are more formed, no associated abdominal      pain.  She is tolerating oral nutrition and not requiring IV fluids      or medications.  Her course for prolonged oral vancomycin taper      with Florastor, Xifaxan and Lomotil is to be continued as per the      directions of [facility] and listed above.  At this time, she is      felt medically stable for discharge pending bed availability at a      short-term skilled nursing facility for treatment of her profound      debilitation due to recurrent and prolonged severe illness.  2. Other medical issues.  The patient's other chronic medical  issues      are as listed above.  She has been hemodynamically stable off her      ACE inhibitor and will remain off Altace at this time until follow      up with her  primary care physician for reassessment of her needs on      this.  Please see hospital chart regarding other full details as      needed.   PHYSICAL EXAMINATION AT TIME OF DICTATION:  VITAL SIGNS:  Temperature  98.0, blood pressure 114/62, pulse of 94, respirations 20, sating 95% on  room air.  GENERAL:  She is a pleasant 77 year old woman who is anxious and  emotional, occasionally tearful, but in no acute distress.  LUNGS:  Clear to auscultation bilaterally with good full air movement.  No wheeze or crackle.  No increased work of breathing at rest.  CARDIOVASCULAR:  Regular rate and rhythm.  There is no lower extremity  edema.  ABDOMEN:  Somewhat protuberant but soft and nontender.  Good bowel  sounds present throughout.  No masses or hepatosplenomegaly appreciated.  NEUROLOGIC:  Fully intact.  Cranial nerves II-XII symmetrically intact.  Speech fluent without dysarthria, and she follows commands.  Gait is  careful and ataxic.  She is generally debilitated and weak but without  specific deficits.   LABORATORY DATA PRIOR TO DISCHARGE:  April 19, 2008 - normal basic  metabolic with sodium of 139, potassium of 4.1, chloride of 107,  bicarbonate 27, BUN of 6, creatinine 0.7 and glucose of 89.  Prealbumin  suppressed at 10.5 on April 16, 2008.  CBC on April 19, 2008 was  white count of 9.8, hemoglobin of 9.1 and platelets of 333.   Thirty-five minutes spent on discharge planning and coordination today.      Melanie A. Felicity Coyer, MD  Electronically Signed     VAL/MEDQ  D:  04/21/2008  T:  04/21/2008  Job:  [id number]

## 2010-08-15 NOTE — Discharge Summary (Signed)
NAMEVELVET, MOOMAW               ACCOUNT NO.:  [id number]   MEDICAL RECORD NO.:  [id number]          PATIENT TYPE:  INP   LOCATION:  3029                         FACILITY:  [facility]   PHYSICIAN:  Payton Doughty, M.D.      DATE OF BIRTH:  05/11/1931   DATE OF ADMISSION:  02/24/2008  DATE OF DISCHARGE:  02/29/2008                               DISCHARGE SUMMARY   ADMITTING DIAGNOSIS:  Spondylosis at L3-4.   DISCHARGE DIAGNOSIS:  Spondylosis at L3-4.   PROCEDURE:  L3-4 anterolateral interbody fusion.   BODY OF TEXT:  A 77 year old right-handed white lady whose history and  physical is recounted in the chart.  She had a fusion at a 4-5 and she  had spondylosis at 3-4 and she had 4-5 decompression, did well, then  developed pain symptoms from 3-4, and is admitted for an XLIF.  She was  admitted after ascertaining normal laboratory values and underwent an  anterolateral interbody fusion.  Postoperatively, she has done  reasonably well.  She, as an elderly female, took several days to get up  and about.  She participated in physical therapy, was up on a walker and  in a brace.  Foley was removed.  PCA was stopped.  She was able to go  home on the fifth postoperative day.  She is discharged to family.           ______________________________  Payton Doughty, M.D.     MWR/MEDQ  D:  05/07/2008  T:  05/07/2008  Job:  [id number]

## 2010-08-15 NOTE — H&P (Signed)
[name], [name]               ACCOUNT NO.:  [id number]   MEDICAL RECORD NO.:  [id number]          PATIENT TYPE:  INP   LOCATION:  3311                         FACILITY:  [facility]   PHYSICIAN:  Payton Doughty, M.D.      DATE OF BIRTH:  Oct 20, 1931   DATE OF ADMISSION:  02/24/2008  DATE OF DISCHARGE:                              HISTORY & PHYSICAL   ADMITTING DIAGNOSIS:  Cervical spondylosis at L3-4.   BODY OF TEXT:  This is a very nice now 77 year old right-handed white  lady who I operated on last March for critical spinal stenosis at L4-5  and did reasonably well, has developed increasing pain in her back and  in her legs.  Second MR was obtained that shows transitional segment  disease at L3-4 with significant stenosis.  She is now admitted for an  XLIF.   Her medical history otherwise is remarkable for adult-onset diabetes.   She takes amiloride, Duragesic, hydrocodone, Actos, and ramipril.   Allergies are PENICILLIN, MYCINS, and IRON.   Surgical history is right shoulder replacement, left shoulder  replacement, and a hysterectomy.   SOCIAL HISTORY:  She smokes 1-2 cigarettes a day, does not drink  alcohol, and works as a Engineer, maintenance.   FAMILY HISTORY:  Mother is 70, in good health with memory loss.  Father  with seizure, active.   REVIEW OF SYSTEMS:  Remarkable for wearing glasses, hypertension, COPD,  back pain, arm pain, joint pain, neck pain, diabetes.  Her HEENT exam,  normal limits.  She has a reasonable range of motion in neck without  Lhermitte.  Chest is clear.  Cardiac exam is regular rate and rhythm.  She has limited range of motion in right shoulder after its replacement.  The left one moves pretty well.  Neurologically, she is awake, alert,  and oriented.  Cranial nerves are intact.  Motor exam shows 5/5 strength  throughout the upper extremities and lower extremities.  She has pain in  the back, and she stands up extended.  Reflexes are absent at the  knees  and ankles.  MR shows spondylosis at 3-4 with stenosis at that level.   CLINICAL IMPRESSION:  Lumbar spondylosis, developing transitional  segment disease.  The hope is by stabilizing L3-4 that her symptoms will  resolve, if they do not should at least be stable, and can be  decompressed at that level without large posterior operation.  Risks and  benefits with decision making have been discussed with her, and she  wishes to proceed.           ______________________________  Payton Doughty, M.D.     MWR/MEDQ  D:  02/24/2008  T:  02/24/2008  Job:  [id number]

## 2010-08-18 NOTE — Letter (Signed)
March 30, 2007    Arta Silence, MD  [address] [place], [place] [address]   RE:  [name], [name]  MRN:  [id number]  /  DOB:  Sep 18, 1931   Dear [name]:   It was my pleasure to see [name] March 24, 2007, for  evaluation of leg pain and peripheral arterial disease.   [name] is a 77 year old woman with a chronic pain syndrome.  [name] has  longstanding back and leg pain.   [name] describes the feeling of restless legs.  This has been  longstanding and somewhat improved on Requip.  [name] also describes pain  in her legs but has a difficult time characterizing the pain.  It is of  a diffuse nature.  [name] does not have specific pain in the thigh muscles  or calf muscles.  Importantly, [name] denies leg pain with walking.  [name]  does quite a bit of walking on a day-to-day basis, especially with her  work, and does not have much leg pain with those activities.  [name] has  not had any ischemic ulcers or areas of skin breakdown in the legs.  [name]  does not have documentation of prior vascular disease.   In reviewing her records, [name] has been undergoing treatment at the  [facility] under the care of Dr. Pernell Dupre.  It appears that [name]  has a history of spinal stenosis and degenerative disk disease.   CURRENT MEDICATIONS:  1. Zolpidem 10 mg at bedtime.  2. Amiloride/hydrochlorothiazide 5/50 mg twice daily.  3. Ropinirole 1 mg at bedtime.  4. Actos 15 mg daily.  5. Levaquin daily   ALLERGIES:  No known drug allergies.   PAST MEDICAL HISTORY:  1. Hypertension.  2. Spinal stenosis.  3. Cervical degenerative disk disease  4. Lumbar facet arthropathy.  5. Chronic insomnia.  6. COPD.  7. Type 2 diabetes.   PAST SURGICAL HISTORY:  1. Left shoulder replacement in 2000.  2. Right shoulder replacement in 2004.  3. Hysterectomy 35 years ago.  4. Hernia repair remotely.  5. Multiple foot surgeries for bunions and hammertoes   SOCIAL HISTORY:  The patient  works as an Geographical information systems officer.  [name] is  widowed with four grown children.  [name] has been a long-time smoker and  has recently quit 2 Jablon ago.  [name] does not drink alcohol.  [name] does  not engage in regular exercise but is active with her work on a daily  basis.   FAMILY HISTORY:  The patient's father died at 30 of a heart attack.  [name]  has a 37 year old brother who is alive and well, and her mother is alive  at age 49.  There is no other history of coronary or peripheral arterial  disease in the family.   REVIEW OF SYSTEMS:  A complete Review of Systems was performed.  Pertinent positives included anemia, constipation, osteoarthritis and  occasional nausea.   PHYSICAL EXAMINATION:  GENERAL:  The patient is alert and oriented.  [name]  is in no acute distress.  VITAL SIGNS:  Her weight is 135 pounds.  Blood pressure is 116/70 on the  right arm, 120/62 on the left arm, heart rate 84, respiratory rate 16.  HEENT:  Normal.  NECK:  Normal carotid upstrokes without bruits.  Jugular venous pressure  is normal.  No thyromegaly or thyroid nodules.  LUNGS:  Clear to auscultation bilaterally.  HEART:  The apex is discreet nondisplaced.  There is  no right  ventricular heave or lift.  The heart is regular rate and rhythm without  murmurs or gallops.  ABDOMEN:  Soft, nontender, no organomegaly.  No abdominal bruits.  BACK:  No CVA tenderness.  EXTREMITIES:  There is no clubbing, cyanosis or edema.  The peripheral  pulse exam shows 2+ femoral pulses as well as 2+ dorsalis pedis and  posterior tibial pulses bilaterally.  SKIN:  Warm and dry without rash.  LYMPHATICS:  There is no adenopathy.  NEUROLOGIC:  Cranial nerves II-XII are intact.  Strength 5/5 and equal  in the arms and legs bilaterally.   ASSESSMENT:  [name] is a 77 year old woman with leg pain.  I think  that her leg pain is likely related to her orthopedic issues.  [name] has a  normal vascular exam with strong peripheral pulses.  An  ABI will be  performed to confirm normal vascular function.  I reassured [name]  today regarding her vascular status.   [name], thanks again for allowing me to participate in the care of  [name].  Please feel free to call at any time with questions  regarding her care.    Sincerely,      Veverly Fells. Excell Seltzer, MD  Electronically Signed    MDC/MedQ  DD: 03/30/2007  DT: 03/30/2007  Job #: [id number]   CC:   Currie Paris. Pernell Dupre, M.D.  Francesco Sor

## 2010-09-07 ENCOUNTER — Other Ambulatory Visit: Payer: Self-pay | Admitting: Family Medicine

## 2010-09-07 DIAGNOSIS — M810 Age-related osteoporosis without current pathological fracture: Secondary | ICD-10-CM

## 2010-09-07 DIAGNOSIS — E785 Hyperlipidemia, unspecified: Secondary | ICD-10-CM

## 2010-09-07 DIAGNOSIS — D509 Iron deficiency anemia, unspecified: Secondary | ICD-10-CM

## 2010-09-07 DIAGNOSIS — K7689 Other specified diseases of liver: Secondary | ICD-10-CM

## 2010-09-07 DIAGNOSIS — E119 Type 2 diabetes mellitus without complications: Secondary | ICD-10-CM

## 2010-09-07 DIAGNOSIS — M79609 Pain in unspecified limb: Secondary | ICD-10-CM

## 2010-09-20 ENCOUNTER — Other Ambulatory Visit: Payer: Self-pay

## 2010-09-21 ENCOUNTER — Other Ambulatory Visit (INDEPENDENT_AMBULATORY_CARE_PROVIDER_SITE_OTHER): Payer: Medicare Other

## 2010-09-21 DIAGNOSIS — E119 Type 2 diabetes mellitus without complications: Secondary | ICD-10-CM

## 2010-09-21 DIAGNOSIS — E785 Hyperlipidemia, unspecified: Secondary | ICD-10-CM

## 2010-09-21 DIAGNOSIS — M79609 Pain in unspecified limb: Secondary | ICD-10-CM

## 2010-09-21 DIAGNOSIS — M810 Age-related osteoporosis without current pathological fracture: Secondary | ICD-10-CM

## 2010-09-21 DIAGNOSIS — D509 Iron deficiency anemia, unspecified: Secondary | ICD-10-CM

## 2010-09-21 DIAGNOSIS — K7689 Other specified diseases of liver: Secondary | ICD-10-CM

## 2010-09-21 LAB — CBC WITH DIFFERENTIAL/PLATELET
Basophils Absolute: 0 10*3/uL (ref 0.0–0.1)
Eosinophils Absolute: 0.2 10*3/uL (ref 0.0–0.7)
MCHC: 34.4 g/dL (ref 30.0–36.0)
MCV: 93.1 fl (ref 78.0–100.0)
Monocytes Absolute: 0.5 10*3/uL (ref 0.1–1.0)
Neutrophils Relative %: 68.8 % (ref 43.0–77.0)
Platelets: 223 10*3/uL (ref 150.0–400.0)
WBC: 9 10*3/uL (ref 4.5–10.5)

## 2010-09-21 LAB — LIPID PANEL
Cholesterol: 153 mg/dL (ref 0–200)
LDL Cholesterol: 102 mg/dL — ABNORMAL HIGH (ref 0–99)
Total CHOL/HDL Ratio: 6
VLDL: 22.8 mg/dL (ref 0.0–40.0)

## 2010-09-21 LAB — RENAL FUNCTION PANEL
Calcium: 9.9 mg/dL (ref 8.4–10.5)
Glucose, Bld: 109 mg/dL — ABNORMAL HIGH (ref 70–99)
Phosphorus: 4 mg/dL (ref 2.3–4.6)
Potassium: 4.1 mEq/L (ref 3.5–5.1)
Sodium: 138 mEq/L (ref 135–145)

## 2010-09-21 LAB — HEPATIC FUNCTION PANEL
Bilirubin, Direct: 0.1 mg/dL (ref 0.0–0.3)
Total Protein: 7.4 g/dL (ref 6.0–8.3)

## 2010-09-21 LAB — MICROALBUMIN / CREATININE URINE RATIO
Creatinine,U: 256.1 mg/dL
Microalb, Ur: 1.1 mg/dL (ref 0.0–1.9)

## 2010-09-27 ENCOUNTER — Encounter: Payer: Self-pay | Admitting: Family Medicine

## 2010-09-28 ENCOUNTER — Other Ambulatory Visit: Payer: Self-pay | Admitting: Family Medicine

## 2010-10-06 ENCOUNTER — Ambulatory Visit (INDEPENDENT_AMBULATORY_CARE_PROVIDER_SITE_OTHER): Payer: Medicare Other | Admitting: Family Medicine

## 2010-10-06 ENCOUNTER — Encounter: Payer: Self-pay | Admitting: Family Medicine

## 2010-10-06 DIAGNOSIS — D509 Iron deficiency anemia, unspecified: Secondary | ICD-10-CM

## 2010-10-06 DIAGNOSIS — I10 Essential (primary) hypertension: Secondary | ICD-10-CM

## 2010-10-06 DIAGNOSIS — K5289 Other specified noninfective gastroenteritis and colitis: Secondary | ICD-10-CM

## 2010-10-06 DIAGNOSIS — Q279 Congenital malformation of peripheral vascular system, unspecified: Secondary | ICD-10-CM

## 2010-10-06 DIAGNOSIS — K5730 Diverticulosis of large intestine without perforation or abscess without bleeding: Secondary | ICD-10-CM

## 2010-10-06 DIAGNOSIS — J4489 Other specified chronic obstructive pulmonary disease: Secondary | ICD-10-CM

## 2010-10-06 DIAGNOSIS — I2510 Atherosclerotic heart disease of native coronary artery without angina pectoris: Secondary | ICD-10-CM

## 2010-10-06 DIAGNOSIS — E119 Type 2 diabetes mellitus without complications: Secondary | ICD-10-CM

## 2010-10-06 DIAGNOSIS — E785 Hyperlipidemia, unspecified: Secondary | ICD-10-CM

## 2010-10-06 DIAGNOSIS — K7689 Other specified diseases of liver: Secondary | ICD-10-CM

## 2010-10-06 DIAGNOSIS — J449 Chronic obstructive pulmonary disease, unspecified: Secondary | ICD-10-CM

## 2010-10-06 MED ORDER — ALPRAZOLAM 0.5 MG PO TABS: 0.5000 mg | ORAL_TABLET | Freq: Every evening | ORAL | Status: DC | PRN

## 2010-10-06 NOTE — Assessment & Plan Note (Signed)
Stable, cont curr meds.

## 2010-10-06 NOTE — Patient Instructions (Signed)
Take Guaifenesin (400mg ), take 11/2 tabs by mouth AM and NOON. Get GUAIFENESIN by  going to [facility] or [facility] and getting MUCOUS RELIEF EXPECTORANT/CONGESTION. DO NOT GET MUCINEX (Timed Release Guaifenesin) Drink lots of fluids.

## 2010-10-06 NOTE — Assessment & Plan Note (Signed)
LFTs nml this year.

## 2010-10-06 NOTE — Assessment & Plan Note (Signed)
Stomach benign, guaiac neg stool, stable CBC with just having given blood. AVM appears stable.

## 2010-10-06 NOTE — Progress Notes (Signed)
  Subjective:    Patient ID: [name], female    DOB: 14-Apr-1931, 77 y.o.   MRN: [id number]  HPI Pt here for Comp Exam. She has had two tick bites lately which does not sound like it has been attached for 24 hrs. She is going to the rrest home frequently as her mother has the third round of pneumonia in the NH. She has not had fever or rash, she has had congestion. She gave blood 3 Jul and felt well.    Review of Systems  Constitutional: Positive for fatigue. Negative for fever, chills, diaphoresis and unexpected weight change.  HENT: Negative for hearing loss, ear pain, rhinorrhea and trouble swallowing. Tinnitus: buzzing constantly.   Eyes: Negative for pain, discharge and visual disturbance.       Vision is getting weaker, cataracts forming.  Respiratory: Negative for shortness of breath and wheezing. Cough: due to drainage.   Cardiovascular: Negative for chest pain, palpitations and leg swelling.       No Fainting or Fatigue.  Gastrointestinal: Positive for diarrhea. Negative for nausea, vomiting, abdominal pain, constipation and blood in stool.       No Heartburn Taking probiotic for the last few Mckinnie due to diarrhea.  Genitourinary: Negative for dysuria and frequency.  Musculoskeletal: Negative for myalgias, back pain and arthralgias.       Has episodes of arm fatigue bilat which comes on quickly and lasts a few mins.  Skin: Negative for rash.       No Itching or Dryness.  Neurological: Negative for tremors and numbness.       No Tingling. No Balance Problems.  Hematological: Negative for adenopathy. Does not bruise/bleed easily.  Psychiatric/Behavioral: Negative for dysphoric mood and agitation.       Objective:   Physical Exam  Constitutional: She is oriented to person, place, and time. She appears well-developed and well-nourished. No distress.  HENT:  Head: Normocephalic and atraumatic.  Left Ear: External ear normal.  Nose: Nose normal.  Mouth/Throat:  Oropharynx is clear and moist. No oropharyngeal exudate.  Eyes: Conjunctivae and EOM are normal. Pupils are equal, round, and reactive to light. No scleral icterus.  Neck: Normal range of motion. Neck supple. No thyromegaly present.  Cardiovascular: Normal rate, regular rhythm and normal heart sounds.  Exam reveals no friction rub.   No murmur heard. Pulmonary/Chest: Effort normal and breath sounds normal. No respiratory distress. She has no wheezes. She has no rales.  Abdominal: Soft. Bowel sounds are normal. She exhibits no mass. There is no tenderness.  Genitourinary: Guaiac negative stool.  Musculoskeletal: Normal range of motion. She exhibits no edema and no tenderness.  Lymphadenopathy:    She has no cervical adenopathy.  Neurological: She is alert and oriented to person, place, and time. She has normal reflexes.  Skin: Skin is warm and dry. No rash noted. She is not diaphoretic. No erythema.  Psychiatric: She has a normal mood and affect. Her behavior is normal. Judgment and thought content normal.          Assessment & Plan:

## 2010-10-06 NOTE — Assessment & Plan Note (Signed)
Lungs sound awful. Needs to stop smoking. Call if sxs worsen. Take Guaifenesin.

## 2010-10-06 NOTE — Assessment & Plan Note (Signed)
Is back on probiotic. Continue.

## 2010-10-06 NOTE — Assessment & Plan Note (Signed)
Good control. Cont curr meds. BP Readings from Last 3 Encounters:  10/06/10 100/60  05/29/10 110/56  05/10/10 124/78

## 2010-10-06 NOTE — Assessment & Plan Note (Signed)
Good control. Avoid fatty foods. Lab Results  Component Value Date   CHOL 153 09/21/2010   CHOL 140 04/20/2010   CHOL 199 09/27/2009   Lab Results  Component Value Date   HDL 27.80* 09/21/2010   HDL 26.20* 04/20/2010   HDL 28.70* 09/27/2009   Lab Results  Component Value Date   LDLCALC 102* 09/21/2010   LDLCALC 93 04/20/2010   LDLCALC 133* 09/27/2009   Lab Results  Component Value Date   TRIG 114.0 09/21/2010   TRIG 102.0 04/20/2010   TRIG 189.0* 09/27/2009   Lab Results  Component Value Date   CHOLHDL 6 09/21/2010   CHOLHDL 5 04/20/2010   CHOLHDL 7 09/27/2009   No results found for this basename: LDLDIRECT

## 2010-10-06 NOTE — Assessment & Plan Note (Signed)
Well  Replaced at this point.

## 2010-10-06 NOTE — Assessment & Plan Note (Signed)
Discussed coming in for prolonged LLQ discomfort. 

## 2010-10-06 NOTE — Assessment & Plan Note (Signed)
Good control on no medication. Cont diet and exercise. Lab Results  Component Value Date   HGBA1C 6.2 09/21/2010

## 2010-11-30 ENCOUNTER — Inpatient Hospital Stay: Payer: Medicare Other | Admitting: Internal Medicine

## 2010-12-01 DIAGNOSIS — I2510 Atherosclerotic heart disease of native coronary artery without angina pectoris: Secondary | ICD-10-CM

## 2010-12-01 DIAGNOSIS — I214 Non-ST elevation (NSTEMI) myocardial infarction: Secondary | ICD-10-CM

## 2010-12-05 ENCOUNTER — Other Ambulatory Visit: Payer: Self-pay | Admitting: Cardiology

## 2010-12-05 ENCOUNTER — Telehealth: Payer: Self-pay | Admitting: *Deleted

## 2010-12-05 DIAGNOSIS — I214 Non-ST elevation (NSTEMI) myocardial infarction: Secondary | ICD-10-CM

## 2010-12-05 DIAGNOSIS — I2510 Atherosclerotic heart disease of native coronary artery without angina pectoris: Secondary | ICD-10-CM

## 2010-12-05 NOTE — Telephone Encounter (Signed)
Pt scheduled for Lexiscan tomorrow at [facility] to assess ischemia in LAD distribution after cath. Pt was given verbal instructions for pre-procedure. Pt will f/u next week in [place].

## 2010-12-06 ENCOUNTER — Encounter: Payer: Self-pay | Admitting: *Deleted

## 2010-12-06 ENCOUNTER — Ambulatory Visit: Payer: Medicare Other | Admitting: Cardiology

## 2010-12-06 DIAGNOSIS — R079 Chest pain, unspecified: Secondary | ICD-10-CM

## 2010-12-07 ENCOUNTER — Telehealth: Payer: Self-pay

## 2010-12-07 NOTE — Telephone Encounter (Signed)
Notified patient [name] looked okay per Dr. Mariah Milling.  The result of myoview will be sent to Dr. Shirlee Latch.  The patient has a follow up with Dr. Shirlee Latch in October 2012.  She was told to call the office if she has any problems before then.

## 2010-12-13 ENCOUNTER — Encounter: Payer: Self-pay | Admitting: Family Medicine

## 2010-12-13 ENCOUNTER — Ambulatory Visit (INDEPENDENT_AMBULATORY_CARE_PROVIDER_SITE_OTHER): Payer: Medicare Other | Admitting: Family Medicine

## 2010-12-13 DIAGNOSIS — E119 Type 2 diabetes mellitus without complications: Secondary | ICD-10-CM

## 2010-12-13 DIAGNOSIS — R5381 Other malaise: Secondary | ICD-10-CM

## 2010-12-13 DIAGNOSIS — I10 Essential (primary) hypertension: Secondary | ICD-10-CM

## 2010-12-13 DIAGNOSIS — R5383 Other fatigue: Secondary | ICD-10-CM

## 2010-12-13 DIAGNOSIS — I2510 Atherosclerotic heart disease of native coronary artery without angina pectoris: Secondary | ICD-10-CM

## 2010-12-13 DIAGNOSIS — E785 Hyperlipidemia, unspecified: Secondary | ICD-10-CM

## 2010-12-13 MED ORDER — METFORMIN HCL 500 MG PO TABS: 500.0000 mg | ORAL_TABLET | Freq: Two times a day (BID) | ORAL | Status: DC

## 2010-12-13 NOTE — Assessment & Plan Note (Signed)
Really needs better control than previously outpatient. Her Chol nos at discharge from the hospital were great, presumed on Lipitor. Nelwyn Salisbury (as she was adamant about this) and then recheck with Dr Shirlee Latch.

## 2010-12-13 NOTE — Assessment & Plan Note (Signed)
Sugar too high and now with CAD s/p MI, needs better control. Start Metformin once a day for two Vogelgesang and then twice a day with morning and evening meals.  She said she often does not eat evening meal and encouraged to do so which may overall help sugar control. Recheck.

## 2010-12-13 NOTE — Assessment & Plan Note (Signed)
Improved with new regimen. Continue.

## 2010-12-13 NOTE — Progress Notes (Signed)
  Subjective:    Patient ID: [name], female    DOB: May 19, 1931, 77 y.o.   MRN: [id number]  HPI Pt her after hospitalization for chest pain and cardiology workup due to BP being low the day of discharge. She was given a bolus of fluid prior to leaving and had some medications readjusted. She has been seen by Dr Mariah Milling last week, being discharged two Humann ago. She does not want to take Plavix as the handout with it scares her. She also prefers to take Prava over Lipitor. I think she will now need the Lipitor to get to goal.     Review of Systems  Constitutional: Negative for fever, chills, diaphoresis, activity change and fatigue.  HENT: Negative for ear pain, congestion, rhinorrhea and postnasal drip.   Eyes: Negative for redness.  Respiratory: Negative for cough, chest tightness, shortness of breath and wheezing.   Cardiovascular: Negative for chest pain, palpitations and leg swelling.       Objective:   Physical Exam  Constitutional: She appears well-developed and well-nourished. No distress.  HENT:  Head: Normocephalic and atraumatic.  Right Ear: External ear normal.  Left Ear: External ear normal.  Nose: Nose normal.  Mouth/Throat: Oropharynx is clear and moist. No oropharyngeal exudate.  Eyes: Conjunctivae and EOM are normal. Pupils are equal, round, and reactive to light.  Neck: Normal range of motion. Neck supple. No thyromegaly present.  Cardiovascular: Normal rate, regular rhythm and normal heart sounds.   Pulmonary/Chest: Effort normal and breath sounds normal. She has no wheezes. She has no rales.  Lymphadenopathy:    She has no cervical adenopathy.  Skin: She is not diaphoretic.          Assessment & Plan:

## 2010-12-13 NOTE — Patient Instructions (Signed)
RTC 2 mos, labs prior.

## 2010-12-13 NOTE — Assessment & Plan Note (Signed)
Good control. Cont curr meds. These were changed in the hospital but look adequate.

## 2010-12-13 NOTE — Assessment & Plan Note (Addendum)
Stable having sustained an MI recently. Is trying to do all she can to get back to good health. Discussed Plavix but she does not want to take it. Needs to discuss this with Dr Shirlee Latch when seen next month.

## 2010-12-20 ENCOUNTER — Encounter: Payer: Self-pay | Admitting: Cardiology

## 2010-12-21 ENCOUNTER — Ambulatory Visit (INDEPENDENT_AMBULATORY_CARE_PROVIDER_SITE_OTHER): Payer: Medicare Other | Admitting: Cardiology

## 2010-12-21 ENCOUNTER — Encounter: Payer: Self-pay | Admitting: Cardiology

## 2010-12-21 DIAGNOSIS — E785 Hyperlipidemia, unspecified: Secondary | ICD-10-CM

## 2010-12-21 DIAGNOSIS — IMO0001 Reserved for inherently not codable concepts without codable children: Secondary | ICD-10-CM

## 2010-12-21 DIAGNOSIS — I714 Abdominal aortic aneurysm, without rupture: Secondary | ICD-10-CM

## 2010-12-21 DIAGNOSIS — I10 Essential (primary) hypertension: Secondary | ICD-10-CM

## 2010-12-21 DIAGNOSIS — Z8249 Family history of ischemic heart disease and other diseases of the circulatory system: Secondary | ICD-10-CM

## 2010-12-21 DIAGNOSIS — I2510 Atherosclerotic heart disease of native coronary artery without angina pectoris: Secondary | ICD-10-CM

## 2010-12-21 MED ORDER — CLOPIDOGREL BISULFATE 75 MG PO TABS: 75.0000 mg | ORAL_TABLET | Freq: Every day | ORAL | Status: DC

## 2010-12-21 MED ORDER — METOPROLOL TARTRATE 25 MG PO TABS: 25.0000 mg | ORAL_TABLET | Freq: Two times a day (BID) | ORAL | Status: DC

## 2010-12-21 MED ORDER — ATORVASTATIN CALCIUM 40 MG PO TABS: 40.0000 mg | ORAL_TABLET | Freq: Every day | ORAL | Status: DC

## 2010-12-21 NOTE — Patient Instructions (Signed)
Start cardiac rehab at [facility]. You will be contacted with your appt by [facility].  STOP Pravastatin. STOP Amiloride/Hctz. START Lipitor 40mg  once daily. START Plavix 75mg  once daily. START Metoprolol 25mg  TWO times daily.  Your physician has requested that you have an abdominal aorta duplex. During this test, an ultrasound is used to evaluate the aorta. Allow 30 minutes for this exam. Do not eat after midnight the day before and avoid carbonated beverages  Your physician recommends that you schedule a follow-up appointment in: 1 month

## 2010-12-22 DIAGNOSIS — I714 Abdominal aortic aneurysm, without rupture: Secondary | ICD-10-CM | POA: Insufficient documentation

## 2010-12-22 NOTE — Assessment & Plan Note (Signed)
Systolic BP readings that patient brings from home fluctuate.  Today, BP is 94/63.  Since I am having her start metoprolol 25 mg bid, I will have her stop amiloride/HCTZ.

## 2010-12-22 NOTE — Assessment & Plan Note (Signed)
Patient says that she has been told she has a AAA.  No recent imaging studies.   I will screen her with an abdominal ultrasound.

## 2010-12-22 NOTE — Assessment & Plan Note (Addendum)
Status post NSTEMI.  Culprit vessel was a small, diffusely diseased RCA.  It was not amenable to intervention.  Lexiscan myoview in followup showed no ischemia in the LAD distribution (70% proximal LAD stenosis on cath).  Plan for medical management.  Patient had one episode of recurrent chest pain since discharge (yesterday).   - Restart on metoprolol 25 mg bid.  This can help decrease angina.   - Restart on Plavix (for medically-managed NSTEMI).  - Continue ASA 81 mg daily, Imdur, and ramipril.  -  I will have her use a more potent statin (atorvastatin) given recent NSTEMI.  - Start cardiac rehab.

## 2010-12-22 NOTE — Assessment & Plan Note (Addendum)
Even though her LDL looks good, I am going to have her switch from pravastatin to the more potent atorvastatin 40 mg daily for its pleiotropic effects post-MI.  Lipids/LFTs 2 months.

## 2010-12-22 NOTE — Progress Notes (Signed)
PCP: [name]  77 yo with history of COPD, HTN, diabetes, and CAD presents to establish outpatient cardiology followup after recent NSTEMI.  Patient was admitted to [facility] with chest pain in 8/12.  Troponin was mildly elevated.  Left heart cath showed severe, diffuse disease in a small RCA that was not amenable to intervention.  This was the likely culprit vessel.  She also had a 70% LAD stenosis.  [name] was then done to assess for ischemia in the LAD distribution.  This showed only a fixed apical perfusion defect with no ischemia (low risk).  Mrs Hartmann has had 1 recurrent chest pain episode.  Yesterday, while driving in her car, she developed central chest tightness that lasted for about 2 hours.  It was relatively mild.  She went to urgent care and was sent home with no ECG changes. Besides this episode, she has had no chest pain since discharge.  She is fatigued in general but denies significant exertional dyspnea.  She quit smoking after coming home from the hospital.  She is not taking metoprolol or Plavix, and she is taking pravastatin instead of Lipitor.  Systolic blood pressure has varied significantly, running from the 90s to the 140s systolic (she brings in her readings today).   ECG: NSR, LVH  Labs (8/12): LDL 58, HDL 22  PMH: 1. COPD 2. HTN 3. Diabetes mellitus type II 4. ? AAA: Patient says she was told by [name] that she has an aneurysm.  5. Chronic low back pain 6. Osteoarthritis 7. Anemia  8. Excision of adrenal mass in 2007 9. Restless leg syndrome. 10. Bilateral shoulder replacements.  11. CAD: Cath in 2004 with moderate nonobstructive disease.  Patient was admitted to [facility] in 8/12 with NSTEMI.  LHC (8/12) showed severe diffuse disease of a small RCA.  This was the culprit lesion but it was too small and diffusely diseased for intervention.  There was a 70% proximal LAD stenosis.  Lexiscan myoview (9/12) to assess for LAD territory ischemia showed a small fixed  apical defect, low risk.  EF 65% by myoview.  Echo (8/12): EF 60%, mild LV hypertrophy, normal RV size and systolic function, no regional wall motion abnormalities.    SH: Prior smoker, quit 9/12 after NSTEMI.  Lives alone in [place].    FH: Father with MI/CHF, died in his 55s.   Current Outpatient Prescriptions  Medication Sig Dispense Refill  . ALPRAZolam (XANAX) 0.5 MG tablet Take 1 tablet (0.5 mg total) by mouth at bedtime as needed.  30 tablet  5  . aspirin 81 MG tablet Take 81 mg by mouth daily.        . Calcium Carbonate-Vit D-Min (CALCIUM 600+D PLUS MINERALS) 600-400 MG-UNIT TABS Take 1 tablet by mouth daily.        . Cholecalciferol (VITAMIN D) 2000 UNITS CAPS Take 1 capsule by mouth daily.        . Coenzyme Q10 (COQ10) 50 MG CAPS Take 1 capsule by mouth daily.        . cyanocobalamin 500 MCG tablet Take 500 mcg by mouth daily.        . diphenhydrAMINE (BENADRYL) 25 mg capsule Take 25 mg by mouth every 4 (four) hours as needed.        . Fiber Complete TABS Take 1 tablet by mouth daily.        [name] guaiFENesin (MUCINEX) 600 MG 12 hr tablet Take 1,200 mg by mouth as needed.        [name]  isosorbide mononitrate (IMDUR) 30 MG CR tablet Take 30 mg by mouth daily.        . metFORMIN (GLUCOPHAGE) 500 MG tablet Take 1 tablet (500 mg total) by mouth 2 (two) times daily with a meal.  60 tablet  11  . nitroGLYCERIN (NITROSTAT) 0.4 MG SL tablet Place 0.4 mg under the tongue every 5 (five) minutes as needed.        [name] oxyCODONE-acetaminophen (PERCOCET) 10-325 MG per tablet Take 1 tablet by mouth every 6 (six) hours as needed.        . Phenazopyridine HCl (AZO-DINE URINARY ANALGESIC PO) Take by mouth as needed.        [name] POTASSIUM GLUCONATE PO Take 1 capsule by mouth daily.        . Probiotic Product (HEALTHY COLON PO) Take by mouth daily.        . ramipril (ALTACE) 2.5 MG capsule Take 2.5 mg by mouth daily.        . Saline (OCEAN NASAL SPRAY NA) by Nasal route as needed.        [name] atorvastatin  (LIPITOR) 40 MG tablet Take 1 tablet (40 mg total) by mouth daily.  30 tablet  11  . clopidogrel (PLAVIX) 75 MG tablet Take 1 tablet (75 mg total) by mouth daily.  30 tablet  11  . metoprolol tartrate (LOPRESSOR) 25 MG tablet Take 1 tablet (25 mg total) by mouth 2 (two) times daily.  60 tablet  11    BP 94/63  Pulse 100  Resp 18  Ht 5\' 2"  (1.575 m)  Wt 138 lb 12.8 oz (62.959 kg)  BMI 25.39 kg/m2 General: elderly, NAD Neck: No JVD, no thyromegaly or thyroid nodule.  Lungs: Mild rhonchi bilaterally CV: Nondisplaced PMI.  Heart regular S1/S2, no S3/S4, no murmur.  No peripheral edema.  No carotid bruit.  Abdomen: Soft, nontender, no hepatosplenomegaly, no distention.  Skin: Intact without lesions or rashes.  Neurologic: Alert and oriented x 3.  Psych: Normal affect. Extremities: No clubbing or cyanosis.  HEENT: Normal.    ROS: All systems reviewed and negative except as per HPI.

## 2010-12-25 LAB — TYPE AND SCREEN

## 2010-12-25 LAB — PROTIME-INR
INR: 1
Prothrombin Time: 13.6

## 2010-12-25 LAB — CBC
Hemoglobin: 11.9 — ABNORMAL LOW
MCHC: 34.3
MCV: 91.4
RBC: 3.78 — ABNORMAL LOW

## 2010-12-25 LAB — DIFFERENTIAL
Basophils Absolute: 0
Eosinophils Absolute: 0.1
Lymphocytes Relative: 32
Lymphs Abs: 2.6
Neutrophils Relative %: 58

## 2010-12-25 LAB — URINALYSIS, ROUTINE W REFLEX MICROSCOPIC
Bilirubin Urine: NEGATIVE
Glucose, UA: NEGATIVE
Nitrite: NEGATIVE
Specific Gravity, Urine: 1.02
pH: 6.5

## 2010-12-25 LAB — COMPREHENSIVE METABOLIC PANEL
ALT: 17
CO2: 26
Calcium: 9.7
Creatinine, Ser: 0.85
GFR calc non Af Amer: >60
Glucose, Bld: 81

## 2010-12-27 ENCOUNTER — Encounter: Payer: Self-pay | Admitting: Family Medicine

## 2010-12-27 ENCOUNTER — Telehealth: Payer: Self-pay | Admitting: *Deleted

## 2010-12-27 ENCOUNTER — Ambulatory Visit (INDEPENDENT_AMBULATORY_CARE_PROVIDER_SITE_OTHER): Payer: Medicare Other | Admitting: Family Medicine

## 2010-12-27 VITALS — BP 124/70 | HR 64 | Temp 97.5°F | Wt 145.8 lb

## 2010-12-27 DIAGNOSIS — R350 Frequency of micturition: Secondary | ICD-10-CM

## 2010-12-27 DIAGNOSIS — N390 Urinary tract infection, site not specified: Secondary | ICD-10-CM

## 2010-12-27 LAB — POCT URINALYSIS DIPSTICK
Glucose, UA: NEGATIVE
Ketones, UA: NEGATIVE
Urobilinogen, UA: 0.2

## 2010-12-27 MED ORDER — NITROFURANTOIN MONOHYD MACRO 100 MG PO CAPS: 100.0000 mg | ORAL_CAPSULE | Freq: Two times a day (BID) | ORAL | Status: AC

## 2010-12-27 NOTE — Progress Notes (Signed)
Addended by: Sydell Axon C on: 12/27/2010 12:30 PM   Modules accepted: Orders

## 2010-12-27 NOTE — Assessment & Plan Note (Signed)
Take Macrobid and for ten days and drink lots of fluids.

## 2010-12-27 NOTE — Progress Notes (Signed)
  Subjective:    Patient ID: [name], female    DOB: 1932/03/20, 77 y.o.   MRN: [id number]  HPI Pty here as acute appt for poss UTI. She thinks her sugar pill causes urinary difficulties. She had a problem with Actos causing urinary sxs. She now has pressure with urination and pain with urination. She also has significant bruising of the forearms with the Plavix. She has been checking her sugar regularly. It gets highest to 164, lowest gets to 122.     Review of Systems  Constitutional: Negative for fever, chills, diaphoresis, activity change and fatigue.  HENT: Negative for ear pain, congestion, rhinorrhea and postnasal drip.   Eyes: Negative for redness.  Respiratory: Negative for cough, chest tightness, shortness of breath and wheezing.   Cardiovascular: Negative for chest pain.       Objective:   Physical Exam  Constitutional: She appears well-developed and well-nourished. No distress.       Nontoxic appearing.  HENT:  Head: Normocephalic and atraumatic.  Right Ear: External ear normal.  Left Ear: External ear normal.  Nose: Nose normal.  Mouth/Throat: Oropharynx is clear and moist. No oropharyngeal exudate.  Eyes: Conjunctivae and EOM are normal. Pupils are equal, round, and reactive to light.  Neck: Normal range of motion. Neck supple. No thyromegaly present.  Cardiovascular: Normal rate, regular rhythm and normal heart sounds.   Pulmonary/Chest: Effort normal and breath sounds normal. She has no wheezes. She has no rales.  Abdominal:       Mild suprapubic tenderness.  Genitourinary:       No CVAT.  Lymphadenopathy:    She has no cervical adenopathy.  Skin: She is not diaphoretic.          Assessment & Plan:

## 2010-12-27 NOTE — Telephone Encounter (Signed)
Pharmacist notified as instructed by telephone. Was informed by pharmacist that this is the generic and her insurance will not cover it because of her age. Pharmacist informed to start her on Keflex per [name].

## 2010-12-27 NOTE — Patient Instructions (Signed)
RTC 2 Schuff with sugar nos.

## 2010-12-27 NOTE — Telephone Encounter (Signed)
Surely there is generic Nitrofurantoin available. Please check with the pharmacy. She is allergic to Cipro and Bactrim.  If generic not available, start Keflex 250mg  4 times a day for ten days 40/0. Otherwise she will have to wait for the culture result.

## 2010-12-27 NOTE — Telephone Encounter (Signed)
Pt was seen this morning and given an antibiotic ( ? Macrobid).  She says this is too expensive for her and she needs it changed to something else.  Her insurances wont pay for it.  Uses [facility].

## 2010-12-30 LAB — URINE CULTURE

## 2011-01-02 LAB — GLUCOSE, CAPILLARY
Glucose-Capillary: 105 — ABNORMAL HIGH
Glucose-Capillary: 123 — ABNORMAL HIGH
Glucose-Capillary: 127 — ABNORMAL HIGH
Glucose-Capillary: 128 — ABNORMAL HIGH
Glucose-Capillary: 136 — ABNORMAL HIGH
Glucose-Capillary: 150 — ABNORMAL HIGH
Glucose-Capillary: 177 — ABNORMAL HIGH
Glucose-Capillary: 233 — ABNORMAL HIGH
Glucose-Capillary: 82

## 2011-01-02 LAB — URINALYSIS, ROUTINE W REFLEX MICROSCOPIC
Glucose, UA: NEGATIVE
Protein, ur: NEGATIVE
pH: 5

## 2011-01-02 LAB — DIFFERENTIAL
Eosinophils Relative: 2
Lymphocytes Relative: 26
Lymphs Abs: 1.8
Monocytes Absolute: 0.5

## 2011-01-02 LAB — COMPREHENSIVE METABOLIC PANEL
AST: 19
Albumin: 4.1
Calcium: 10
Creatinine, Ser: 0.88
GFR calc Af Amer: >60

## 2011-01-02 LAB — CBC
MCHC: 33.9
MCV: 93.3
Platelets: 197
WBC: 7

## 2011-01-02 LAB — TYPE AND SCREEN: ABO/RH(D): A POS

## 2011-01-03 ENCOUNTER — Encounter: Payer: Medicare Other | Admitting: Cardiology

## 2011-01-05 LAB — CBC
HCT: 22.4 % — ABNORMAL LOW (ref 36.0–46.0)
HCT: 28.1 % — ABNORMAL LOW (ref 36.0–46.0)
HCT: 34.6 % — ABNORMAL LOW (ref 36.0–46.0)
Hemoglobin: 11.6 g/dL — ABNORMAL LOW (ref 12.0–15.0)
Hemoglobin: 7.7 g/dL — CL (ref 12.0–15.0)
Hemoglobin: 9.5 g/dL — ABNORMAL LOW (ref 12.0–15.0)
MCHC: 32.3 g/dL (ref 30.0–36.0)
MCHC: 33.5 g/dL (ref 30.0–36.0)
MCHC: 34.4 g/dL (ref 30.0–36.0)
MCV: 91.6 fL (ref 78.0–100.0)
Platelets: 410 K/uL — ABNORMAL HIGH (ref 150–400)
RBC: 2.96 MIL/uL — ABNORMAL LOW (ref 3.87–5.11)
RBC: 3.77 MIL/uL — ABNORMAL LOW (ref 3.87–5.11)
RDW: 13.6 % (ref 11.5–15.5)
RDW: 13.6 % (ref 11.5–15.5)
RDW: 13.9 % (ref 11.5–15.5)
RDW: 13.9 % (ref 11.5–15.5)
WBC: 11.7 10*3/uL — ABNORMAL HIGH (ref 4.0–10.5)
WBC: 18.6 K/uL — ABNORMAL HIGH (ref 4.0–10.5)

## 2011-01-05 LAB — URINALYSIS, ROUTINE W REFLEX MICROSCOPIC
Bilirubin Urine: NEGATIVE
Glucose, UA: NEGATIVE mg/dL
Ketones, ur: 15 mg/dL — AB
Nitrite: NEGATIVE
Nitrite: NEGATIVE
Protein, ur: NEGATIVE mg/dL
Protein, ur: NEGATIVE mg/dL
Specific Gravity, Urine: 1.016 (ref 1.005–1.030)
Specific Gravity, Urine: 1.027 (ref 1.005–1.030)
Urobilinogen, UA: 0.2 mg/dL (ref 0.0–1.0)
Urobilinogen, UA: 1 mg/dL (ref 0.0–1.0)
pH: 5.5 (ref 5.0–8.0)

## 2011-01-05 LAB — HEMOGLOBIN AND HEMATOCRIT, BLOOD
HCT: 26.1 % — ABNORMAL LOW (ref 36.0–46.0)
HCT: 26.3 % — ABNORMAL LOW (ref 36.0–46.0)
HCT: 27.1 % — ABNORMAL LOW (ref 36.0–46.0)
HCT: 27.1 % — ABNORMAL LOW (ref 36.0–46.0)
HCT: 27.5 % — ABNORMAL LOW (ref 36.0–46.0)
HCT: 27.7 % — ABNORMAL LOW (ref 36.0–46.0)
HCT: 31.3 % — ABNORMAL LOW (ref 36.0–46.0)
HCT: 35.5 % — ABNORMAL LOW (ref 36.0–46.0)
Hemoglobin: 11.4 g/dL — ABNORMAL LOW (ref 12.0–15.0)
Hemoglobin: 8.6 g/dL — ABNORMAL LOW (ref 12.0–15.0)
Hemoglobin: 8.7 g/dL — ABNORMAL LOW (ref 12.0–15.0)
Hemoglobin: 9 g/dL — ABNORMAL LOW (ref 12.0–15.0)
Hemoglobin: 9.2 g/dL — ABNORMAL LOW (ref 12.0–15.0)
Hemoglobin: 9.2 g/dL — ABNORMAL LOW (ref 12.0–15.0)
Hemoglobin: 9.3 g/dL — ABNORMAL LOW (ref 12.0–15.0)

## 2011-01-05 LAB — POCT I-STAT, CHEM 8
BUN: 35 mg/dL — ABNORMAL HIGH (ref 6–23)
Calcium, Ion: 1.14 mmol/L (ref 1.12–1.32)
Chloride: 100 mEq/L (ref 96–112)
Creatinine, Ser: 1.2 mg/dL (ref 0.4–1.2)
Glucose, Bld: 134 mg/dL — ABNORMAL HIGH (ref 70–99)
HCT: 37 % (ref 36.0–46.0)
Hemoglobin: 12.6 g/dL (ref 12.0–15.0)
Potassium: 4.3 meq/L (ref 3.5–5.1)
Sodium: 135 meq/L (ref 135–145)
TCO2: 28 mmol/L (ref 0–100)

## 2011-01-05 LAB — DIFFERENTIAL
Basophils Absolute: 0.1 K/uL (ref 0.0–0.1)
Basophils Relative: 0 % (ref 0–1)
Eosinophils Absolute: 0.2 10*3/uL (ref 0.0–0.7)
Eosinophils Relative: 1 % (ref 0–5)
Lymphocytes Relative: 10 % — ABNORMAL LOW (ref 12–46)
Lymphs Abs: 1.9 10*3/uL (ref 0.7–4.0)
Monocytes Absolute: 0.8 10*3/uL (ref 0.1–1.0)
Monocytes Relative: 5 % (ref 3–12)
Neutro Abs: 15.6 K/uL — ABNORMAL HIGH (ref 1.7–7.7)
Neutrophils Relative %: 84 % — ABNORMAL HIGH (ref 43–77)

## 2011-01-05 LAB — GLUCOSE, CAPILLARY
Glucose-Capillary: 100 mg/dL — ABNORMAL HIGH (ref 70–99)
Glucose-Capillary: 101 mg/dL — ABNORMAL HIGH (ref 70–99)
Glucose-Capillary: 103 mg/dL — ABNORMAL HIGH (ref 70–99)
Glucose-Capillary: 103 mg/dL — ABNORMAL HIGH (ref 70–99)
Glucose-Capillary: 104 mg/dL — ABNORMAL HIGH (ref 70–99)
Glucose-Capillary: 105 mg/dL — ABNORMAL HIGH (ref 70–99)
Glucose-Capillary: 107 mg/dL — ABNORMAL HIGH (ref 70–99)
Glucose-Capillary: 108 mg/dL — ABNORMAL HIGH (ref 70–99)
Glucose-Capillary: 109 mg/dL — ABNORMAL HIGH (ref 70–99)
Glucose-Capillary: 109 mg/dL — ABNORMAL HIGH (ref 70–99)
Glucose-Capillary: 110 mg/dL — ABNORMAL HIGH (ref 70–99)
Glucose-Capillary: 111 mg/dL — ABNORMAL HIGH (ref 70–99)
Glucose-Capillary: 111 mg/dL — ABNORMAL HIGH (ref 70–99)
Glucose-Capillary: 112 mg/dL — ABNORMAL HIGH (ref 70–99)
Glucose-Capillary: 113 mg/dL — ABNORMAL HIGH (ref 70–99)
Glucose-Capillary: 113 mg/dL — ABNORMAL HIGH (ref 70–99)
Glucose-Capillary: 113 mg/dL — ABNORMAL HIGH (ref 70–99)
Glucose-Capillary: 115 mg/dL — ABNORMAL HIGH (ref 70–99)
Glucose-Capillary: 117 mg/dL — ABNORMAL HIGH (ref 70–99)
Glucose-Capillary: 117 mg/dL — ABNORMAL HIGH (ref 70–99)
Glucose-Capillary: 119 mg/dL — ABNORMAL HIGH (ref 70–99)
Glucose-Capillary: 120 mg/dL — ABNORMAL HIGH (ref 70–99)
Glucose-Capillary: 121 mg/dL — ABNORMAL HIGH (ref 70–99)
Glucose-Capillary: 121 mg/dL — ABNORMAL HIGH (ref 70–99)
Glucose-Capillary: 123 mg/dL — ABNORMAL HIGH (ref 70–99)
Glucose-Capillary: 124 mg/dL — ABNORMAL HIGH (ref 70–99)
Glucose-Capillary: 131 mg/dL — ABNORMAL HIGH (ref 70–99)
Glucose-Capillary: 133 mg/dL — ABNORMAL HIGH (ref 70–99)
Glucose-Capillary: 134 mg/dL — ABNORMAL HIGH (ref 70–99)
Glucose-Capillary: 134 mg/dL — ABNORMAL HIGH (ref 70–99)
Glucose-Capillary: 139 mg/dL — ABNORMAL HIGH (ref 70–99)
Glucose-Capillary: 139 mg/dL — ABNORMAL HIGH (ref 70–99)
Glucose-Capillary: 139 mg/dL — ABNORMAL HIGH (ref 70–99)
Glucose-Capillary: 140 mg/dL — ABNORMAL HIGH (ref 70–99)
Glucose-Capillary: 147 mg/dL — ABNORMAL HIGH (ref 70–99)
Glucose-Capillary: 159 mg/dL — ABNORMAL HIGH (ref 70–99)
Glucose-Capillary: 161 mg/dL — ABNORMAL HIGH (ref 70–99)
Glucose-Capillary: 168 mg/dL — ABNORMAL HIGH (ref 70–99)
Glucose-Capillary: 187 mg/dL — ABNORMAL HIGH (ref 70–99)
Glucose-Capillary: 208 mg/dL — ABNORMAL HIGH (ref 70–99)
Glucose-Capillary: 90 mg/dL (ref 70–99)
Glucose-Capillary: 91 mg/dL (ref 70–99)
Glucose-Capillary: 94 mg/dL (ref 70–99)
Glucose-Capillary: 96 mg/dL (ref 70–99)
Glucose-Capillary: 96 mg/dL (ref 70–99)
Glucose-Capillary: 97 mg/dL (ref 70–99)

## 2011-01-05 LAB — COMPREHENSIVE METABOLIC PANEL WITH GFR
ALT: 10 U/L (ref 0–35)
AST: 12 U/L (ref 0–37)
CO2: 26 meq/L (ref 19–32)
Calcium: 9.5 mg/dL (ref 8.4–10.5)
GFR calc Af Amer: >60 mL/min (ref >60)
Potassium: 4.1 meq/L (ref 3.5–5.1)
Sodium: 137 meq/L (ref 135–145)
Total Protein: 7.4 g/dL (ref 6.0–8.3)

## 2011-01-05 LAB — URINE MICROSCOPIC-ADD ON

## 2011-01-05 LAB — STOOL CULTURE

## 2011-01-05 LAB — CROSSMATCH

## 2011-01-05 LAB — BASIC METABOLIC PANEL
BUN: 3 mg/dL — ABNORMAL LOW (ref 6–23)
BUN: 8 mg/dL (ref 6–23)
CO2: 27 mEq/L (ref 19–32)
CO2: 27 mEq/L (ref 19–32)
CO2: 28 mEq/L (ref 19–32)
Calcium: 8.6 mg/dL (ref 8.4–10.5)
Calcium: 8.6 mg/dL (ref 8.4–10.5)
Calcium: 8.6 mg/dL (ref 8.4–10.5)
Calcium: 8.9 mg/dL (ref 8.4–10.5)
Chloride: 102 mEq/L (ref 96–112)
Creatinine, Ser: 0.65 mg/dL (ref 0.4–1.2)
Creatinine, Ser: 0.81 mg/dL (ref 0.4–1.2)
GFR calc Af Amer: >60 mL/min (ref >60)
GFR calc Af Amer: >60 mL/min (ref >60)
GFR calc non Af Amer: >60 mL/min (ref >60)
GFR calc non Af Amer: >60 mL/min (ref >60)
GFR calc non Af Amer: >60 mL/min (ref >60)
Glucose, Bld: 174 mg/dL — ABNORMAL HIGH (ref 70–99)
Glucose, Bld: 95 mg/dL (ref 70–99)
Glucose, Bld: 98 mg/dL (ref 70–99)
Potassium: 3.6 mEq/L (ref 3.5–5.1)
Sodium: 138 mEq/L (ref 135–145)
Sodium: 139 mEq/L (ref 135–145)
Sodium: 141 mEq/L (ref 135–145)

## 2011-01-05 LAB — APTT: aPTT: 32 seconds (ref 24–37)

## 2011-01-05 LAB — URINE CULTURE: Colony Count: >=100000

## 2011-01-05 LAB — LIPASE, BLOOD: Lipase: 20 U/L (ref 11–59)

## 2011-01-05 LAB — COMPREHENSIVE METABOLIC PANEL
Albumin: 3.5 g/dL (ref 3.5–5.2)
Alkaline Phosphatase: 110 U/L (ref 39–117)
BUN: 33 mg/dL — ABNORMAL HIGH (ref 6–23)
Chloride: 96 mEq/L (ref 96–112)
Creatinine, Ser: 0.93 mg/dL (ref 0.4–1.2)
GFR calc non Af Amer: 59 mL/min — ABNORMAL LOW (ref >60)
Glucose, Bld: 116 mg/dL — ABNORMAL HIGH (ref 70–99)
Total Bilirubin: 0.6 mg/dL (ref 0.3–1.2)

## 2011-01-05 LAB — CULTURE, BLOOD (ROUTINE X 2): Culture: NO GROWTH

## 2011-01-05 LAB — PROTIME-INR: INR: 1.1 (ref 0.00–1.49)

## 2011-01-05 LAB — CLOSTRIDIUM DIFFICILE EIA
C difficile Toxins A+B, EIA: NEGATIVE
C difficile Toxins A+B, EIA: NEGATIVE

## 2011-01-05 LAB — LIPID PANEL: Cholesterol: 85 mg/dL (ref 0–200)

## 2011-01-05 LAB — OCCULT BLOOD X 1 CARD TO LAB, STOOL
Fecal Occult Bld: NEGATIVE
Fecal Occult Bld: NEGATIVE

## 2011-01-10 ENCOUNTER — Encounter: Payer: Self-pay | Admitting: Family Medicine

## 2011-01-10 ENCOUNTER — Ambulatory Visit (INDEPENDENT_AMBULATORY_CARE_PROVIDER_SITE_OTHER): Payer: Medicare Other | Admitting: Family Medicine

## 2011-01-10 VITALS — BP 100/60 | HR 68 | Temp 97.1°F | Wt 142.2 lb

## 2011-01-10 DIAGNOSIS — N390 Urinary tract infection, site not specified: Secondary | ICD-10-CM

## 2011-01-10 DIAGNOSIS — E119 Type 2 diabetes mellitus without complications: Secondary | ICD-10-CM

## 2011-01-10 DIAGNOSIS — I10 Essential (primary) hypertension: Secondary | ICD-10-CM

## 2011-01-10 LAB — POCT URINALYSIS DIPSTICK
Bilirubin, UA: NEGATIVE
Blood, UA: NEGATIVE
Glucose, UA: NEGATIVE
Nitrite, UA: NEGATIVE
Spec Grav, UA: 1.02
pH, UA: 6

## 2011-01-10 MED ORDER — SULFAMETHOXAZOLE-TRIMETHOPRIM 800-160 MG PO TABS: 1.0000 | ORAL_TABLET | Freq: Two times a day (BID) | ORAL | Status: AC

## 2011-01-10 NOTE — Assessment & Plan Note (Addendum)
Sugars by her diary anywhere from 80s to 160, typically 120s but approx 50% of the time over 126.  Discussed being more careful with diet and her exercise regimen should help.  Will follow and see her back as scheduled next month with bloodwork prior.

## 2011-01-10 NOTE — Patient Instructions (Signed)
RTC as scheduled, labs prior.

## 2011-01-10 NOTE — Assessment & Plan Note (Signed)
BP generally too high. She hesitates to increase medication so will allow her until being seen by Cardiology to reassess and possibly treat. BP Readings from Last 3 Encounters:  01/10/11 100/60  12/27/10 124/70  12/21/10 94/63

## 2011-01-10 NOTE — Assessment & Plan Note (Signed)
Pt still with some pressure and micro showing 4-8 WBCs with no RBCs and few Epis seen. Will retreat with Bactrim, since she swears she has never been allergic to Sulfa. Will send urine culture.

## 2011-01-10 NOTE — Progress Notes (Signed)
  Subjective:    Patient ID: [name], female    DOB: 11/10/1931, 77 y.o.   MRN: [id number]  HPI Pt here for two week followup after being treated for UTI with Keflex since the  Macrobid that was prescribed was too expensive. She was to bring sugar nos with her.  She still has slight pressure of urine but symptoms are significantly better. She finished her ten days of Abs 4 days ago. She has started rehab at [facility] Monday and is to go again today.  She is taking no diabetes meds since the hospital and is watching her diet carefully. She has a book about cardiac diets, avoiding egg yokes, etc which she is trying to follow.     Review of Systems  Constitutional: Negative for fever, chills, diaphoresis, activity change and fatigue.  HENT: Negative for ear pain, congestion, rhinorrhea and postnasal drip.   Eyes: Negative for redness.  Respiratory: Negative for cough, chest tightness, shortness of breath and wheezing.   Cardiovascular: Negative for chest pain.  Genitourinary: Positive for frequency (with some pressure.). Negative for dysuria, urgency, hematuria, flank pain, decreased urine volume, enuresis, difficulty urinating and pelvic pain.       Objective:   Physical Exam  Constitutional: She appears well-developed and well-nourished. No distress.  HENT:  Head: Normocephalic and atraumatic.  Right Ear: External ear normal.  Left Ear: External ear normal.  Nose: Nose normal.  Mouth/Throat: Oropharynx is clear and moist. No oropharyngeal exudate.  Eyes: Conjunctivae and EOM are normal. Pupils are equal, round, and reactive to light.  Neck: Normal range of motion. Neck supple. No thyromegaly present.  Cardiovascular: Normal rate, regular rhythm and normal heart sounds.   Pulmonary/Chest: Effort normal and breath sounds normal. She has no wheezes. She has no rales.  Abdominal:       No suprapubic tenderness.  Genitourinary:       No CVAT.  Lymphadenopathy:    She has no cervical  adenopathy.  Skin: She is not diaphoretic.          Assessment & Plan:

## 2011-01-11 ENCOUNTER — Encounter: Payer: Medicare Other | Admitting: Cardiology

## 2011-01-12 ENCOUNTER — Encounter (INDEPENDENT_AMBULATORY_CARE_PROVIDER_SITE_OTHER): Payer: Medicare Other | Admitting: *Deleted

## 2011-01-12 DIAGNOSIS — IMO0001 Reserved for inherently not codable concepts without codable children: Secondary | ICD-10-CM

## 2011-01-12 DIAGNOSIS — I700 Atherosclerosis of aorta: Secondary | ICD-10-CM

## 2011-01-12 LAB — URINE CULTURE: Organism ID, Bacteria: NO GROWTH

## 2011-01-17 ENCOUNTER — Telehealth: Payer: Self-pay | Admitting: *Deleted

## 2011-01-17 NOTE — Telephone Encounter (Signed)
Pt asking if she can now have colonoscopy. Pt states that you wanted her to hold off about 2-3 months at ov early 12/2010 due to MI. She cancelled her appt with GI/Dr. Marva Panda last week, but reports incr GI trouble and wants to know if any way she could have done sooner. Please advise.

## 2011-01-17 NOTE — Telephone Encounter (Signed)
Attempted to contact pt, LMOM TCB. Will try back tomorrow.

## 2011-01-17 NOTE — Telephone Encounter (Signed)
If no chest pain, at this point she can get colonoscopy.

## 2011-01-19 NOTE — Telephone Encounter (Signed)
Attempted to contact pt, her vm is full, no other number. Will try again later.

## 2011-01-23 NOTE — Telephone Encounter (Signed)
Pt calling stating that she needs medical clearance for pt to have colonoscopy as well as any lab work results we have done. Pt was also told by Dr. Reyes Ivan office she needs to be off the blood thinner for 5 days prior to procedure.   Dr. Reyes Ivan fax number: [phone]  Please return pt call to discuss further/advise.

## 2011-01-24 NOTE — Telephone Encounter (Signed)
I spoke to [name] at [facility] with Dr. Marva Panda and she states that pt has not been scheduled for colonoscopy yet, she only has appt with NP for abdominal pain, they will then decide if she needs colonoscopy. [name] states that if clearance is needed, they will be sure to notify our office. No clearance is needed at this time.

## 2011-02-01 ENCOUNTER — Encounter: Payer: Medicare Other | Admitting: Cardiology

## 2011-02-02 ENCOUNTER — Encounter: Payer: Self-pay | Admitting: Cardiology

## 2011-02-07 ENCOUNTER — Encounter: Payer: Self-pay | Admitting: Cardiology

## 2011-02-07 ENCOUNTER — Ambulatory Visit (INDEPENDENT_AMBULATORY_CARE_PROVIDER_SITE_OTHER): Payer: Medicare Other | Admitting: Cardiology

## 2011-02-07 VITALS — BP 170/78 | HR 71 | Ht 61.0 in | Wt 139.0 lb

## 2011-02-07 DIAGNOSIS — I2510 Atherosclerotic heart disease of native coronary artery without angina pectoris: Secondary | ICD-10-CM

## 2011-02-07 DIAGNOSIS — I10 Essential (primary) hypertension: Secondary | ICD-10-CM

## 2011-02-07 DIAGNOSIS — E785 Hyperlipidemia, unspecified: Secondary | ICD-10-CM

## 2011-02-07 DIAGNOSIS — F17200 Nicotine dependence, unspecified, uncomplicated: Secondary | ICD-10-CM

## 2011-02-07 MED ORDER — ASPIRIN 325 MG PO TABS: 325.0000 mg | ORAL_TABLET | Freq: Every day | ORAL | Status: DC

## 2011-02-07 MED ORDER — METOPROLOL TARTRATE 50 MG PO TABS: 50.0000 mg | ORAL_TABLET | Freq: Two times a day (BID) | ORAL | Status: DC

## 2011-02-07 MED ORDER — RAMIPRIL 2.5 MG PO CAPS: 5.0000 mg | ORAL_CAPSULE | Freq: Every day | ORAL | Status: DC

## 2011-02-07 MED ORDER — RAMIPRIL 5 MG PO CAPS: 5.0000 mg | ORAL_CAPSULE | Freq: Every day | ORAL | Status: DC

## 2011-02-07 MED ORDER — BUPROPION HCL ER (SR) 100 MG PO TB12: 100.0000 mg | ORAL_TABLET | Freq: Two times a day (BID) | ORAL | Status: DC

## 2011-02-07 NOTE — Patient Instructions (Signed)
Your physician has recommended you make the following change in your medication: INCREASE Metoprolol to 50mg  Twice daily. INCREASE Ramipril to 5mg  daily. INCREASE Aspirin to 325mg . START Wellbutrin 100mg  ONCE daily for 1 WEEK, then take TWICE daily.  You need to have, BMP, Lipid, LFT at Dr. Lorenza Chick office tomorrow as scheduled.   Your physician recommends that you schedule a follow-up appointment in: 3 months

## 2011-02-08 ENCOUNTER — Ambulatory Visit (INDEPENDENT_AMBULATORY_CARE_PROVIDER_SITE_OTHER): Payer: Medicare Other

## 2011-02-08 ENCOUNTER — Other Ambulatory Visit (INDEPENDENT_AMBULATORY_CARE_PROVIDER_SITE_OTHER): Payer: Medicare Other

## 2011-02-08 DIAGNOSIS — I2510 Atherosclerotic heart disease of native coronary artery without angina pectoris: Secondary | ICD-10-CM

## 2011-02-08 DIAGNOSIS — D509 Iron deficiency anemia, unspecified: Secondary | ICD-10-CM

## 2011-02-08 DIAGNOSIS — M810 Age-related osteoporosis without current pathological fracture: Secondary | ICD-10-CM

## 2011-02-08 DIAGNOSIS — Z23 Encounter for immunization: Secondary | ICD-10-CM

## 2011-02-08 DIAGNOSIS — M79609 Pain in unspecified limb: Secondary | ICD-10-CM

## 2011-02-08 DIAGNOSIS — E119 Type 2 diabetes mellitus without complications: Secondary | ICD-10-CM

## 2011-02-08 DIAGNOSIS — E785 Hyperlipidemia, unspecified: Secondary | ICD-10-CM

## 2011-02-08 DIAGNOSIS — K7689 Other specified diseases of liver: Secondary | ICD-10-CM

## 2011-02-08 LAB — CBC WITH DIFFERENTIAL/PLATELET
Basophils Absolute: 0 10*3/uL (ref 0.0–0.1)
Basophils Relative: 0.6 % (ref 0.0–3.0)
HCT: 34 % — ABNORMAL LOW (ref 36.0–46.0)
Hemoglobin: 11.3 g/dL — ABNORMAL LOW (ref 12.0–15.0)
Lymphs Abs: 1.7 10*3/uL (ref 0.7–4.0)
Monocytes Relative: 7.3 % (ref 3.0–12.0)
Neutro Abs: 3.5 10*3/uL (ref 1.4–7.7)
RBC: 3.63 Mil/uL — ABNORMAL LOW (ref 3.87–5.11)
RDW: 14.7 % — ABNORMAL HIGH (ref 11.5–14.6)

## 2011-02-08 LAB — LIPID PANEL
Cholesterol: 100 mg/dL (ref 0–200)
HDL: 32.4 mg/dL — ABNORMAL LOW (ref >39.00)
VLDL: 13.4 mg/dL (ref 0.0–40.0)

## 2011-02-08 LAB — RENAL FUNCTION PANEL
Albumin: 4 g/dL (ref 3.5–5.2)
BUN: 19 mg/dL (ref 6–23)
CO2: 28 mEq/L (ref 19–32)
Calcium: 9 mg/dL (ref 8.4–10.5)
Chloride: 108 mEq/L (ref 96–112)
Creatinine, Ser: 0.8 mg/dL (ref 0.4–1.2)
GFR: 69.83 mL/min (ref >60.00)
Glucose, Bld: 82 mg/dL (ref 70–99)
Phosphorus: 3.2 mg/dL (ref 2.3–4.6)
Potassium: 3.6 mEq/L (ref 3.5–5.1)
Sodium: 143 mEq/L (ref 135–145)

## 2011-02-08 LAB — HEPATIC FUNCTION PANEL
ALT: 14 U/L (ref 0–35)
AST: 16 U/L (ref 0–37)
Bilirubin, Direct: 0 mg/dL (ref 0.0–0.3)
Total Protein: 7.2 g/dL (ref 6.0–8.3)

## 2011-02-08 LAB — IRON: Iron: 42 ug/dL (ref 42–145)

## 2011-02-08 LAB — HEMOGLOBIN A1C: Hgb A1c MFr Bld: 5.3 % (ref 4.6–6.5)

## 2011-02-08 LAB — MAGNESIUM: Magnesium: 1.9 mg/dL (ref 1.5–2.5)

## 2011-02-08 NOTE — Progress Notes (Signed)
PCP: [name]  77 yo with history of COPD, HTN, diabetes, and CAD presents for cardiology followup after recent NSTEMI.  Patient was admitted to [facility] with chest pain in 8/12.  Troponin was mildly elevated.  Left heart cath showed severe, diffuse disease in a small RCA that was not amenable to intervention.  This was the likely culprit vessel.  She also had a 70% LAD stenosis.  [name] was then done to assess for ischemia in the LAD distribution.  This showed only a fixed apical perfusion defect with no ischemia (low risk).    Mrs Patchen' mother, who was in her 16s, passed away recently.  She was under a lot of stress and unfortunately started back to smoking.  She had some chest pressure the night her mother died and took nitroglycerine with relief.  Otherwise, she has had no chest pain since her last appointment.  She has been doing cardiac rehab.  She denies exertional dyspnea with house or yardwork.  She raked her yard yesterday.  The abdominal pain that she had been having resolved somewhat spontaneously.  Her GI MD wants to do a colonoscopy.  Finally, she wants to stop Plavix.  She bruises very easily and it bothers her.   ECG: NSR, normal  Labs (8/12): LDL 58, HDL 22  PMH: 1. COPD 2. HTN 3. Diabetes mellitus type II 4. ? AAA: Patient says she was told by [name] that she has an aneurysm. Abdominal US (10/12) showed no AAA.  5. Chronic low back pain 6. Osteoarthritis 7. Anemia  8. Excision of adrenal mass in 2007 9. Restless leg syndrome. 10. Bilateral shoulder replacements.  11. CAD: Cath in 2004 with moderate nonobstructive disease.  Patient was admitted to [facility] in 8/12 with NSTEMI.  LHC (8/12) showed severe diffuse disease of a small RCA.  This was the culprit lesion but it was too small and diffusely diseased for intervention.  There was a 70% proximal LAD stenosis.  Lexiscan myoview (9/12) to assess for LAD territory ischemia showed a small fixed apical defect, low  risk.  EF 65% by myoview.  Echo (8/12): EF 60%, mild LV hypertrophy, normal RV size and systolic function, no regional wall motion abnormalities.    SH: Quit smoking in 9/12 after NSTEMI.  Now restarted.  Lives alone in [place].    FH: Father with MI/CHF, died in his 65s.   ROS: All systems reviewed and negative except as per HPI.   Current Outpatient Prescriptions  Medication Sig Dispense Refill  . ALPRAZolam (XANAX) 0.5 MG tablet Take 1 tablet (0.5 mg total) by mouth at bedtime as needed.  30 tablet  5  . atorvastatin (LIPITOR) 40 MG tablet Take 1 tablet (40 mg total) by mouth daily.  30 tablet  11  . Calcium Carbonate-Vit D-Min (CALCIUM 600+D PLUS MINERALS) 600-400 MG-UNIT TABS Take 1 tablet by mouth daily.        . Cholecalciferol (VITAMIN D) 2000 UNITS CAPS Take 1 capsule by mouth daily.        . clopidogrel (PLAVIX) 75 MG tablet Take 1 tablet (75 mg total) by mouth daily.  30 tablet  11  . diphenhydrAMINE (BENADRYL) 25 mg capsule Take 25 mg by mouth every 4 (four) hours as needed.        . Fiber Complete TABS Take 1 tablet by mouth daily.        [name] guaiFENesin (MUCINEX) 600 MG 12 hr tablet Take 1,200 mg by mouth as needed.        [name]  isosorbide mononitrate (IMDUR) 30 MG CR tablet Take 30 mg by mouth daily.        . metoprolol tartrate (LOPRESSOR) 50 MG tablet Take 1 tablet (50 mg total) by mouth 2 (two) times daily.  60 tablet  11  . nitroGLYCERIN (NITROSTAT) 0.4 MG SL tablet Place 0.4 mg under the tongue every 5 (five) minutes as needed.        [name] oxyCODONE-acetaminophen (PERCOCET) 10-325 MG per tablet Take 1 tablet by mouth every 6 (six) hours as needed.        [name] POTASSIUM GLUCONATE PO Take 1 capsule by mouth daily.        . Probiotic Product (HEALTHY COLON PO) Take by mouth daily.        . ramipril (ALTACE) 5 MG capsule Take 1 capsule (5 mg total) by mouth daily.  30 capsule  6  . Saline (OCEAN NASAL SPRAY NA) by Nasal route as needed.        [name] aspirin (BAYER ASPIRIN) 325 MG tablet  Take 1 tablet (325 mg total) by mouth daily.  100 tablet  3  . buPROPion (WELLBUTRIN SR) 100 MG 12 hr tablet Take 1 tablet (100 mg total) by mouth 2 (two) times daily.  60 tablet  6    BP 170/78  Pulse 71  Ht 5\' 1"  (1.549 m)  Wt 63.05 kg (139 lb)  BMI 26.26 kg/m2 General: elderly, NAD Neck: No JVD, no thyromegaly or thyroid nodule.  Lungs: Prolonged expiratory phase.  CV: Nondisplaced PMI.  Heart regular S1/S2, no S3/S4, no murmur.  No peripheral edema.  No carotid bruit.  Abdomen: Soft, nontender, no hepatosplenomegaly, no distention.  Neurologic: Alert and oriented x 3.  Psych: Normal affect. Extremities: No clubbing or cyanosis.

## 2011-02-08 NOTE — Progress Notes (Signed)
Addended by: Baldomero Lamy on: 02/08/2011 08:55 AM   Modules accepted: Orders

## 2011-02-09 DIAGNOSIS — F17200 Nicotine dependence, unspecified, uncomplicated: Secondary | ICD-10-CM | POA: Insufficient documentation

## 2011-02-09 LAB — VITAMIN D 25 HYDROXY (VIT D DEFICIENCY, FRACTURES): Vit D, 25-Hydroxy: 43 ng/mL (ref 30–89)

## 2011-02-09 NOTE — Assessment & Plan Note (Signed)
BP has been running high.  I will increase metoprolol to 50 mg bid and ramipril to 5 mg daily.  BMET in 2 Codispoti.

## 2011-02-09 NOTE — Assessment & Plan Note (Addendum)
Goal LDL < 70.   Will check lipids/LFTs.

## 2011-02-09 NOTE — Assessment & Plan Note (Signed)
I strongly encouraged her to quit smoking again.  I will give her a prescription for Wellbutrin.

## 2011-02-09 NOTE — Assessment & Plan Note (Signed)
Status post NSTEMI.  Culprit vessel was a small, diffusely diseased RCA.  It was not amenable to intervention.  Lexiscan myoview in followup showed no ischemia in the LAD distribution (70% proximal LAD stenosis on cath).  Plan for continued medical management.  Since last appointment, she had only 1 chest pain episode (the night her mother died).  Continue ASA, statin, metoprolol, ramipril, and Imdur.  She does not want to take Plavix.  She does not have a cardiac stent so it is not an absolute indication, but Plavix can decrease the risk of death or MI after a medically-managed heart attack.  I will let her stop Plavix and will increase ASA to 162 mg daily.

## 2011-02-15 ENCOUNTER — Encounter: Payer: Self-pay | Admitting: Family Medicine

## 2011-02-15 ENCOUNTER — Ambulatory Visit (INDEPENDENT_AMBULATORY_CARE_PROVIDER_SITE_OTHER): Payer: Medicare Other | Admitting: Family Medicine

## 2011-02-15 DIAGNOSIS — I10 Essential (primary) hypertension: Secondary | ICD-10-CM

## 2011-02-15 DIAGNOSIS — K7689 Other specified diseases of liver: Secondary | ICD-10-CM

## 2011-02-15 DIAGNOSIS — I2510 Atherosclerotic heart disease of native coronary artery without angina pectoris: Secondary | ICD-10-CM

## 2011-02-15 DIAGNOSIS — E785 Hyperlipidemia, unspecified: Secondary | ICD-10-CM

## 2011-02-15 DIAGNOSIS — D509 Iron deficiency anemia, unspecified: Secondary | ICD-10-CM

## 2011-02-15 DIAGNOSIS — J449 Chronic obstructive pulmonary disease, unspecified: Secondary | ICD-10-CM

## 2011-02-15 DIAGNOSIS — F341 Dysthymic disorder: Secondary | ICD-10-CM

## 2011-02-15 DIAGNOSIS — E119 Type 2 diabetes mellitus without complications: Secondary | ICD-10-CM

## 2011-02-15 MED ORDER — ALPRAZOLAM 0.5 MG PO TABS: 0.5000 mg | ORAL_TABLET | Freq: Every evening | ORAL | Status: DC | PRN

## 2011-02-15 NOTE — Assessment & Plan Note (Signed)
Just saw Dr Jearld Pies. Adjusted her medications.

## 2011-02-15 NOTE — Assessment & Plan Note (Signed)
This has greatly resolved since her mother died and not needing to go to the NH all the time. Uses her Xanax nightly frequently to help go to bed. Script written for the next 6 months.

## 2011-02-15 NOTE — Assessment & Plan Note (Signed)
Great numbers. Cont Atorvastatin 40mg .  Lab Results  Component Value Date   CHOL 100 02/08/2011   HDL 32.40* 02/08/2011   LDLCALC 54 02/08/2011   TRIG 67.0 02/08/2011   CHOLHDL 3 02/08/2011

## 2011-02-15 NOTE — Assessment & Plan Note (Signed)
Stable.Stopping smoking will help immeasurably. She is committed to slowly cutting down.

## 2011-02-15 NOTE — Patient Instructions (Signed)
Call for next appt with Dr Para March or Dr Reece Agar when necessary.

## 2011-02-15 NOTE — Progress Notes (Signed)
  Subjective:    Patient ID: [name], female    DOB: Aug 11, 1931, 77 y.o.   MRN: [id number]  HPI Pt here for two month followup. She has seen Dr Jearld Pies who asked her to restart her Metoprolol and Lipitor in place of Prava. She is still taking both of them. He gave her Wellbutrin to help get over her mother's death and smoking which made her very somnolent, so she stopped that. She is doing well now and feels good.  She is going to therapy three times a week which has at least helped her BP with some relief of her back.  She had labs drawn prior to this visit.  Review of SystemsNoncontributory except as above.       Objective:   Physical Exam  Constitutional: She appears well-developed and well-nourished. No distress.  HENT:  Head: Normocephalic and atraumatic.  Right Ear: External ear normal.  Left Ear: External ear normal.  Nose: Nose normal.  Mouth/Throat: Oropharynx is clear and moist. No oropharyngeal exudate.  Eyes: Conjunctivae and EOM are normal. Pupils are equal, round, and reactive to light.  Neck: Normal range of motion. Neck supple. No thyromegaly present.  Cardiovascular: Normal rate, regular rhythm and normal heart sounds.   Pulmonary/Chest: Effort normal and breath sounds normal. She has no wheezes. She has no rales.  Lymphadenopathy:    She has no cervical adenopathy.  Skin: She is not diaphoretic.          Assessment & Plan:

## 2011-02-15 NOTE — Assessment & Plan Note (Signed)
Slightly elevated today. Will follow. Has just had beta blocker adjusted.

## 2011-02-15 NOTE — Assessment & Plan Note (Addendum)
Good control. A1C and fasting glucose both great. Cont curr lifestyle. Doing well off medications. Continue. Lab Results  Component Value Date   HGBA1C 5.3 02/08/2011

## 2011-02-15 NOTE — Assessment & Plan Note (Signed)
Still anemic with iron on the very low side. Needs more greens as she does not tolerate iron orally. Lab Results  Component Value Date   WBC 5.9 02/08/2011   HGB 11.3* 02/08/2011   HCT 34.0* 02/08/2011   MCV 93.8 02/08/2011   PLT 173.0 02/08/2011

## 2011-02-15 NOTE — Assessment & Plan Note (Signed)
Nml LFTs at present.

## 2011-03-03 ENCOUNTER — Encounter: Payer: Medicare Other | Admitting: Cardiology

## 2011-04-03 ENCOUNTER — Encounter: Payer: Self-pay | Admitting: Cardiology

## 2011-04-17 ENCOUNTER — Inpatient Hospital Stay: Payer: Self-pay | Admitting: Internal Medicine

## 2011-04-17 LAB — URINALYSIS, COMPLETE
Bacteria: NONE SEEN
Bilirubin,UR: NEGATIVE
Blood: NEGATIVE
Glucose,UR: NEGATIVE mg/dL (ref 0–75)
Nitrite: NEGATIVE
Protein: NEGATIVE
Specific Gravity: 1.027 (ref 1.003–1.030)
Squamous Epithelial: 1
WBC UR: 7 /HPF (ref 0–5)

## 2011-04-17 LAB — COMPREHENSIVE METABOLIC PANEL
Anion Gap: 10 (ref 7–16)
Bilirubin,Total: 0.3 mg/dL (ref 0.2–1.0)
Calcium, Total: 8.7 mg/dL (ref 8.5–10.1)
Chloride: 107 mmol/L (ref 98–107)
Co2: 28 mmol/L (ref 21–32)
EGFR (Non-African Amer.): >60
Glucose: 112 mg/dL — ABNORMAL HIGH (ref 65–99)
Osmolality: 293 (ref 275–301)
Potassium: 3.6 mmol/L (ref 3.5–5.1)
Sodium: 145 mmol/L (ref 136–145)

## 2011-04-17 LAB — MAGNESIUM: Magnesium: 1.6 mg/dL — ABNORMAL LOW

## 2011-04-17 LAB — CBC
HCT: 33.3 % — ABNORMAL LOW (ref 35.0–47.0)
MCH: 29.8 pg (ref 26.0–34.0)
MCHC: 32.5 g/dL (ref 32.0–36.0)
MCV: 92 fL (ref 80–100)
RBC: 3.63 10*6/uL — ABNORMAL LOW (ref 3.80–5.20)
RDW: 13.9 % (ref 11.5–14.5)

## 2011-04-17 LAB — PROTIME-INR
INR: 1
Prothrombin Time: 13.6 secs (ref 11.5–14.7)

## 2011-04-18 HISTORY — PX: COLONIC EMBOLIZATION: SHX1373

## 2011-04-18 LAB — BASIC METABOLIC PANEL
Anion Gap: 7 (ref 7–16)
BUN: 21 mg/dL — ABNORMAL HIGH (ref 7–18)
Chloride: 113 mmol/L — ABNORMAL HIGH (ref 98–107)
EGFR (Non-African Amer.): >60
Glucose: 92 mg/dL (ref 65–99)
Osmolality: 289 (ref 275–301)
Potassium: 3.6 mmol/L (ref 3.5–5.1)
Sodium: 144 mmol/L (ref 136–145)

## 2011-04-18 LAB — CK TOTAL AND CKMB (NOT AT ARMC)
CK, Total: 40 U/L (ref 21–215)
CK-MB: 0.6 ng/mL (ref 0.5–3.6)

## 2011-04-18 LAB — HEMOGLOBIN: HGB: 9.4 g/dL — ABNORMAL LOW (ref 12.0–16.0)

## 2011-04-18 LAB — TROPONIN I
Troponin-I: <0.02 ng/mL
Troponin-I: <0.02 ng/mL

## 2011-04-18 LAB — MAGNESIUM: Magnesium: 1.4 mg/dL — ABNORMAL LOW

## 2011-04-19 LAB — CBC WITH DIFFERENTIAL/PLATELET
Basophil #: 0 10*3/uL (ref 0.0–0.1)
Basophil %: 0.2 %
Eosinophil %: 0.2 %
HCT: 26.1 % — ABNORMAL LOW (ref 35.0–47.0)
HGB: 8.9 g/dL — ABNORMAL LOW (ref 12.0–16.0)
Lymphocyte %: 7.2 %
MCH: 30.4 pg (ref 26.0–34.0)
Monocyte #: 0.9 10*3/uL — ABNORMAL HIGH (ref 0.0–0.7)
Monocyte %: 7.6 %
Neutrophil %: 84.8 %
Platelet: 111 10*3/uL — ABNORMAL LOW (ref 150–440)
RBC: 2.94 10*6/uL — ABNORMAL LOW (ref 3.80–5.20)
WBC: 11.6 10*3/uL — ABNORMAL HIGH (ref 3.6–11.0)

## 2011-04-19 LAB — RAPID INFLUENZA A&B ANTIGENS

## 2011-04-19 LAB — HEMOGLOBIN: HGB: 7.7 g/dL — ABNORMAL LOW (ref 12.0–16.0)

## 2011-04-19 LAB — BASIC METABOLIC PANEL
Anion Gap: 13 (ref 7–16)
BUN: 17 mg/dL (ref 7–18)
Chloride: 105 mmol/L (ref 98–107)
Creatinine: 0.73 mg/dL (ref 0.60–1.30)
EGFR (Non-African Amer.): >60
Glucose: 136 mg/dL — ABNORMAL HIGH (ref 65–99)
Osmolality: 289 (ref 275–301)

## 2011-04-20 LAB — BASIC METABOLIC PANEL
Anion Gap: 11 (ref 7–16)
BUN: 8 mg/dL (ref 7–18)
Calcium, Total: 7.9 mg/dL — ABNORMAL LOW (ref 8.5–10.1)
Chloride: 108 mmol/L — ABNORMAL HIGH (ref 98–107)
Co2: 27 mmol/L (ref 21–32)
Creatinine: 0.62 mg/dL (ref 0.60–1.30)
EGFR (African American): >60
EGFR (Non-African Amer.): >60
Glucose: 86 mg/dL (ref 65–99)
Osmolality: 288 (ref 275–301)
Potassium: 3.4 mmol/L — ABNORMAL LOW (ref 3.5–5.1)
Sodium: 146 mmol/L — ABNORMAL HIGH (ref 136–145)

## 2011-04-20 LAB — CBC WITH DIFFERENTIAL/PLATELET
Basophil #: 0 10*3/uL (ref 0.0–0.1)
Basophil %: 0.3 %
Eosinophil #: 0.1 10*3/uL (ref 0.0–0.7)
Eosinophil %: 1.6 %
HCT: 25.2 % — ABNORMAL LOW (ref 35.0–47.0)
HGB: 8.5 g/dL — ABNORMAL LOW (ref 12.0–16.0)
Lymphocyte #: 1.6 10*3/uL (ref 1.0–3.6)
MCH: 29.8 pg (ref 26.0–34.0)
Monocyte #: 0.7 10*3/uL (ref 0.0–0.7)
Monocyte %: 7.7 %
Neutrophil %: 71.7 %
RDW: 14.8 % — ABNORMAL HIGH (ref 11.5–14.5)
WBC: 8.8 10*3/uL (ref 3.6–11.0)

## 2011-04-25 ENCOUNTER — Ambulatory Visit (INDEPENDENT_AMBULATORY_CARE_PROVIDER_SITE_OTHER): Payer: Medicare Other | Admitting: Family Medicine

## 2011-04-25 ENCOUNTER — Encounter: Payer: Self-pay | Admitting: Family Medicine

## 2011-04-25 VITALS — BP 120/82 | HR 66 | Temp 97.6°F | Ht 61.0 in | Wt 137.0 lb

## 2011-04-25 DIAGNOSIS — K922 Gastrointestinal hemorrhage, unspecified: Secondary | ICD-10-CM

## 2011-04-25 DIAGNOSIS — N390 Urinary tract infection, site not specified: Secondary | ICD-10-CM

## 2011-04-25 DIAGNOSIS — R300 Dysuria: Secondary | ICD-10-CM

## 2011-04-25 LAB — POCT URINALYSIS DIPSTICK
Bilirubin, UA: NEGATIVE
Glucose, UA: NEGATIVE
Ketones, UA: NEGATIVE
Nitrite, UA: POSITIVE
Spec Grav, UA: 1.02
pH, UA: 7

## 2011-04-25 MED ORDER — PHENAZOPYRIDINE HCL 95 MG PO TABS: 95.0000 mg | ORAL_TABLET | Freq: Three times a day (TID) | ORAL | Status: AC | PRN

## 2011-04-25 MED ORDER — SULFAMETHOXAZOLE-TRIMETHOPRIM 400-80 MG PO TABS: 1.0000 | ORAL_TABLET | Freq: Two times a day (BID) | ORAL | Status: AC

## 2011-04-25 NOTE — Progress Notes (Signed)
Patient Name: [name] Date of Birth: 1931-05-21 Age: 78 y.o. Medical Record Number: [id number] Gender: female Date of Encounter: 04/25/2011  History of Present Illness:  [name] is a 78 y.o. very pleasant female patient who presents with the following:  Followup status post acute GI bleed, and the patient ultimately had a microembolization procedure by vascular surgery secondary to a diverticular bleed. She is currently holding her aspirin. She has had some blood when she has had bowel movements since her discharge from the hospital.  Still uncomforttable in the left lower quadrant -- had a shooter marble size coe out. This morning had a BM, and had a lot of blood. Still having some abdominal pain and a little bit uncomfortable.   Worried about it happening again.   Admitted 04/17/2011 Discharged 04/21/2011  Saw Dr. Reyes Ivan --- wants to do colonoscopy on 3/12.  Taking some metamucil. Took some fiber capsules.   Past Medical History, Surgical History, Social History, Family History, Problem List, Medications, and Allergies have been reviewed and updated if relevant.  Patient Active Problem List  Diagnoses  . DIABETES MELLITUS, TYPE II  . HYPERLIPIDEMIA  . ANEMIA-IRON DEFICIENCY  . ANXIETY DEPRESSION  . INSOMNIA, CHRONIC  . HYPERTENSION  . CORONARY ARTERY DISEASE  . COPD  . DIVERTICULOSIS OF COLON  . FATTY LIVER DISEASE  . ECZEMA  . RHEUMATOID ARTHRITIS  . OSTEOARTHRITIS  . LEG PAIN, BILATERAL  . OSTEOPOROSIS  . ARTERIOVENOUS MALFORMATION, GASTRIC  . FATIGUE  . AAA (abdominal aortic aneurysm)  . Smoking  . GIB (gastrointestinal bleeding)   Past Medical History  Diagnosis Date  . Hypertension 08/01/1995  . Rheumatoid arthritis 04/03/1983  . Osteoarthritis 04/03/1983  . Anemia, deficiency 07/01/2004    (w/u by Dr. Lorre Nick)  . Diabetes mellitus type II 07/31/1997  . Osteoporosis 12/31/2000  . CAD (coronary artery disease) 09/01/2002    s/p NSTEMI  2012  . Hyperlipemia 07/02/1999  . COPD (chronic obstructive pulmonary disease) 05/31/2004  . diverticulosis of colon   . Anxiety and depression   . Arteriovenous malformation of gastrointestinal tract   . Fatty liver   . Chronic back pain    Past Surgical History  Procedure Date  . Nasal sinus surgery   . Inguinal hernia repair   . Partial hysterectomy     ovaries intact, dysmennorhea w/ A/P repari  . Carpal tunnel release     bilat  . Trigger finger release     right  . Wrist surgery     neuroma repair, right  . Ankle surgery     neuroma repair, right ORIF  . Total shoulder replacement 12/13/1998    right, ([place])  . Rotator cuff repair 11/22/2003    ([place]) left  . Epidural block injection 05/2005    x 3  . Long finger tendon realignment 10/31/2005    [place]  . Lumbar epidural 06/13/2006  . Laminectomy 06/2006    L4/5 spinal stenosis (Dr. [name])  . Laminectomy 02/24/2008    L3/4 Ant/Lat interbody fusion and Lat Artthrodesis w/plate using XLIF (Dr. [name])  . Colonic embolization 04/18/2011    Dr. Wyn Quaker, acute GI bleed   History  Substance Use Topics  . Smoking status: Current Everyday Smoker -- 62 years    Types: Cigarettes  . Smokeless tobacco: Never Used  . Alcohol Use: No     red wine occassionally   Family History  Problem Relation Age of Onset  . Hypotension Mother   . Dementia  Mother   . Alcohol abuse Father   . Heart disease Father     MI, enlarged heart  . Diabetes Brother    Allergies  Allergen Reactions  . Ciprofloxacin     REACTION: UNSPECIFIED  . Iron   . Morphine Sulfate     nausea  . Naproxen     REACTION: UNSPECIFIED   Current Outpatient Prescriptions on File Prior to Visit  Medication Sig Dispense Refill  . ALPRAZolam (XANAX) 0.5 MG tablet Take 1 tablet (0.5 mg total) by mouth at bedtime as needed.  30 tablet  5  . atorvastatin (LIPITOR) 40 MG tablet Take 1 tablet (40 mg total) by mouth daily.  30 tablet  11  . Calcium  Carbonate-Vit D-Min (CALCIUM 600+D PLUS MINERALS) 600-400 MG-UNIT TABS Take 1 tablet by mouth daily.        . diphenhydrAMINE (BENADRYL) 25 mg capsule Take 25 mg by mouth every 4 (four) hours as needed.        . Fiber Complete TABS Take 1 tablet by mouth daily.        [name] guaiFENesin (MUCINEX) 600 MG 12 hr tablet Take 1,200 mg by mouth as needed.        . isosorbide mononitrate (IMDUR) 30 MG CR tablet Take 30 mg by mouth daily.        . metoprolol tartrate (LOPRESSOR) 25 MG tablet Take 25 mg by mouth 2 (two) times daily.        [name] oxyCODONE-acetaminophen (PERCOCET) 10-325 MG per tablet Take 1 tablet by mouth every 6 (six) hours as needed.        [name] POTASSIUM GLUCONATE PO Take 1 capsule by mouth daily.        . Probiotic Product (HEALTHY COLON PO) Take by mouth daily.        . Saline (OCEAN NASAL SPRAY NA) by Nasal route as needed.        [name] aspirin (BAYER ASPIRIN) 325 MG tablet Take 1 tablet (325 mg total) by mouth daily.  100 tablet  3  . nitroGLYCERIN (NITROSTAT) 0.4 MG SL tablet Place 0.4 mg under the tongue every 5 (five) minutes as needed.           Review of Systems: Hospital records were reviewed in full. The patient is eating, she is passing some gas. She does have some discomfort in her lower abdomen. No fever, chills or sweats. She reports that her hemoglobin was stable when she went to the gastroenterological office on Monday.  Physical Examination: Filed Vitals:   04/25/11 1459  BP: 120/82  Pulse: 66  Temp: 97.6 F (36.4 C)  TempSrc: Oral  Height: 5\' 1"  (1.549 m)  Weight: 137 lb (62.143 kg)  SpO2: 98%    Body mass index is 25.89 kg/(m^2).   GEN: WDWN, NAD, Non-toxic, A & O x 3 HEENT: Atraumatic, Normocephalic. Neck supple. No masses, No LAD. Ears and Nose: No external deformity. CV: RRR, No M/G/R. No JVD. No thrill. No extra heart sounds. PULM: CTA B, no wheezes, crackles, rhonchi. No retractions. No resp. distress. No accessory muscle use. Abdomen: Soft, minimally tender  more in the lower quadrants. Nondistended. Positive bowel sounds EXTR: No c/c/e NEURO Normal gait.  PSYCH: Normally interactive. Conversant. Not depressed or anxious appearing.  Calm demeanor.    Assessment and Plan: 1. GI bleeding    2. Dysuria  Urine culture, POCT Urinalysis Dipstick  3. UTI (lower urinary tract infection)  sulfamethoxazole-trimethoprim (BACTRIM,SEPTRA) 400-80 MG per tablet  The patient also had some complaints of dysuria, and a urinalysis was positive for nitrites leukocyte esterase. Start antibiotics.  For now hold her aspirin given her acute GI bleed. Think that she needs to be fully free and stable prior to restarting this.

## 2011-04-25 NOTE — Patient Instructions (Addendum)
Stool softeners - take twice a day Keep up the metamucil  F/u 3 months

## 2011-04-27 LAB — URINE CULTURE

## 2011-05-10 ENCOUNTER — Ambulatory Visit (INDEPENDENT_AMBULATORY_CARE_PROVIDER_SITE_OTHER): Payer: Medicare Other | Admitting: Family Medicine

## 2011-05-10 ENCOUNTER — Encounter: Payer: Self-pay | Admitting: Family Medicine

## 2011-05-10 VITALS — BP 120/84 | HR 70 | Temp 97.6°F | Ht 62.0 in | Wt 137.8 lb

## 2011-05-10 DIAGNOSIS — J019 Acute sinusitis, unspecified: Secondary | ICD-10-CM

## 2011-05-10 DIAGNOSIS — R509 Fever, unspecified: Secondary | ICD-10-CM

## 2011-05-10 MED ORDER — AMOXICILLIN-POT CLAVULANATE 875-125 MG PO TABS: 1.0000 | ORAL_TABLET | Freq: Two times a day (BID) | ORAL | Status: AC

## 2011-05-10 MED ORDER — FLUTICASONE PROPIONATE 50 MCG/ACT NA SUSP: 2.0000 | Freq: Every day | NASAL | Status: DC

## 2011-05-10 NOTE — Progress Notes (Signed)
  Patient Name: [name] Date of Birth: [birth year] Age: 78 y.o. Medical Record Number: [id number] Gender: female Date of Encounter: 05/10/2011  History of Present Illness:  [name] is a 78 y.o. very pleasant female patient who presents with the following:  Has been running for a year. Now fever for 2-3 [place]. Pleasant patient with a history of a significant GI bleed requiring hospitalization that was prolonged, multiple transfusions and vascular intervention presents now with fever for the last 2-3 [place]. She has been having some nasal drainage that she reports has been present for greater than a year. No significant facial pain or earache. No sore throat. Continues to have some left-sided lower abdominal pain. No dysuria currently. She recently did have a urinary tract infection, and this is resolved with Septra.  Filed Weights   05/10/11 0847  Weight: 137 lb 12.8 oz (62.506 kg)   Wt Readings from Last 3 Encounters:  05/10/11 137 lb 12.8 oz (62.506 kg)  04/25/11 137 lb (62.143 kg)  02/15/11 140 lb 12 oz (63.844 kg)     Past Medical History, Surgical History, Social History, Family History, Problem List, Medications, and Allergies have been reviewed and updated if relevant.  Review of Systems: ROS: GEN: Acute illness details above GI: Tolerating PO intake GU: maintaining adequate hydration and urination Pulm: No SOB Interactive and getting along well at home.  Otherwise, ROS is as per the HPI.   Physical Examination: Filed Vitals:   05/10/11 0847  BP: 120/84  Pulse: 70  Temp: 97.6 F (36.4 C)  TempSrc: Oral  Height: 5\' 2"  (1.575 m)  Weight: 137 lb 12.8 oz (62.506 kg)  SpO2: 98%    Body mass index is 25.20 kg/(m^2).   Gen: WDWN, NAD; A & O x3, cooperative. Pleasant.Globally Non-toxic HEENT: Normocephalic and atraumatic. Throat clear, w/o exudate, R TM clear, L TM - good landmarks, No fluid present. rhinnorhea.  MMM Frontal sinuses: NT Max sinuses:  NT NECK: Anterior cervical  LAD is absent CV: RRR, No M/G/R, cap refill <2 sec PULM: Breathing comfortably in no respiratory distress. no wheezing, crackles, rhonchi ABD:: S, mild LLQ tenderness, ND, + BS EXT: No c/c/e PSYCH: Friendly, good eye contact MSK: Nml gait    Assessment and Plan:  1. Fever  amoxicillin-clavulanate (AUGMENTIN) 875-125 MG per tablet  2. Sinusitis acute      Persistent nasal drainage with fever. Most likely sinusitis. Lungs are very clear. Left lower abdominal pain. It could be possible that she has diverticulitis theoretically, but this is mostly due to 2 prior operative intervention in this area very recently. Nevertheless, appointment and would cover and be appropriate for all.

## 2011-05-16 ENCOUNTER — Encounter: Payer: Self-pay | Admitting: Cardiology

## 2011-05-16 ENCOUNTER — Ambulatory Visit (INDEPENDENT_AMBULATORY_CARE_PROVIDER_SITE_OTHER): Payer: Medicare Other | Admitting: Cardiology

## 2011-05-16 VITALS — BP 192/77 | HR 71 | Ht 61.0 in | Wt 138.0 lb

## 2011-05-16 DIAGNOSIS — I10 Essential (primary) hypertension: Secondary | ICD-10-CM

## 2011-05-16 DIAGNOSIS — E785 Hyperlipidemia, unspecified: Secondary | ICD-10-CM

## 2011-05-16 DIAGNOSIS — F17200 Nicotine dependence, unspecified, uncomplicated: Secondary | ICD-10-CM

## 2011-05-16 DIAGNOSIS — M79609 Pain in unspecified limb: Secondary | ICD-10-CM

## 2011-05-16 DIAGNOSIS — IMO0001 Reserved for inherently not codable concepts without codable children: Secondary | ICD-10-CM

## 2011-05-16 DIAGNOSIS — I2510 Atherosclerotic heart disease of native coronary artery without angina pectoris: Secondary | ICD-10-CM

## 2011-05-16 MED ORDER — RAMIPRIL 5 MG PO CAPS: 5.0000 mg | ORAL_CAPSULE | Freq: Every day | ORAL | Status: DC

## 2011-05-16 MED ORDER — HYDROCHLOROTHIAZIDE 25 MG PO TABS: 25.0000 mg | ORAL_TABLET | Freq: Every day | ORAL | Status: DC

## 2011-05-16 NOTE — Patient Instructions (Signed)
Your physician wants you to follow-up in: 3 months in the [place] office. You will receive a reminder letter in the mail two months in advance. If you don't receive a letter, please call our office to schedule the follow-up appointment.  Increase the ramipril to 5 mg one tablet daily.  Start on Aspirin 81 mg one tablet daily. Start Hydrochlorothiazide 25 mg take one tablet daily.  BMET in two [place]. ABI with lower extremity doppler

## 2011-05-17 NOTE — Progress Notes (Signed)
PCP: [name]  78 yo with history of COPD, HTN, diabetes, and CAD presents for cardiology followup after recent NSTEMI.  Patient was admitted to [facility] with chest pain in 8/12.  Troponin was mildly elevated.  Left heart cath showed severe, diffuse disease in a small RCA that was not amenable to intervention.  This was the likely culprit vessel.  She also had a 70% LAD stenosis.  [name] was then done to assess for ischemia in the LAD distribution.  This showed only a fixed apical perfusion defect with no ischemia (low risk).    She was admitted to [facility] in 1/13 with a lower GI bleed that was thought to be diverticular.  She required blood transfusion.  She is off aspirin. She has not noted any blood in her stool for a number of [place] now.   Lately, she has been doing well.  She walks in the mall for exercise and goes to the [facility] several times a week.  No exertional dyspnea or chest pain.  She reports that her feet feel cold, and she has some generalized pain in her legs bilaterally.  She is down to smoking 2-3 cigarettes/week.   Labs (8/12): LDL 58, HDL 22 Labs (11/12): LDL 54, HDl 32, creatinine 0.8  PMH: 1. COPD 2. HTN 3. Diabetes mellitus type II 4. ? AAA: Patient says she was told by [name] that she has an aneurysm. Abdominal US (10/12) showed no AAA.  5. Chronic low back pain 6. Osteoarthritis 7. Anemia  8. Excision of adrenal mass in 2007 9. Restless leg syndrome. 10. Bilateral shoulder replacements.  11. CAD: Cath in 2004 with moderate nonobstructive disease.  Patient was admitted to [facility] in 8/12 with NSTEMI.  LHC (8/12) showed severe diffuse disease of a small RCA.  This was the culprit lesion but it was too small and diffusely diseased for intervention.  There was a 70% proximal LAD stenosis.  Lexiscan myoview (9/12) to assess for LAD territory ischemia showed a small fixed apical defect, low risk.  EF 65% by myoview.  Echo (8/12): EF 60%, mild LV hypertrophy, normal RV  size and systolic function, no regional wall motion abnormalities.   12. Diverticular bleed 1/13 requiring microembolization by vascular surgery.   SH: Quit smoking in 9/12 after NSTEMI.  Now restarted.  Lives alone in [place].    FH: Father with MI/CHF, died in his 4s.   ROS: All systems reviewed and negative except as per HPI.   Current Outpatient Prescriptions  Medication Sig Dispense Refill  . ALPRAZolam (XANAX) 0.5 MG tablet Take 1 tablet (0.5 mg total) by mouth at bedtime as needed.  30 tablet  5  . amoxicillin-clavulanate (AUGMENTIN) 875-125 MG per tablet Take 1 tablet by mouth 2 (two) times daily.  20 tablet  0  . atorvastatin (LIPITOR) 40 MG tablet Take 1 tablet (40 mg total) by mouth daily.  30 tablet  11  . Calcium Carbonate-Vit D-Min (CALCIUM 600+D PLUS MINERALS) 600-400 MG-UNIT TABS Take 1 tablet by mouth daily.        . diphenhydrAMINE (BENADRYL) 25 mg capsule Take 25 mg by mouth every 4 (four) hours as needed.        . Fiber Complete TABS Take 1 tablet by mouth daily.        . fluticasone (FLONASE) 50 MCG/ACT nasal spray Place 2 sprays into the nose daily.  16 g  12  . guaiFENesin (MUCINEX) 600 MG 12 hr tablet Take 1,200 mg  by mouth as needed.        . metoprolol tartrate (LOPRESSOR) 25 MG tablet Take 25 mg by mouth 2 (two) times daily.        . nitroGLYCERIN (NITROSTAT) 0.4 MG SL tablet Place 0.4 mg under the tongue every 5 (five) minutes as needed.        [name] oxyCODONE-acetaminophen (PERCOCET) 10-325 MG per tablet Take 1 tablet by mouth every 6 (six) hours as needed.        [name] POTASSIUM GLUCONATE PO Take 1 capsule by mouth daily.        . Probiotic Product (HEALTHY COLON PO) Take by mouth daily.        . Saline (OCEAN NASAL SPRAY NA) by Nasal route as needed.        [name] aspirin EC 81 MG tablet Take 1 tablet (81 mg total) by mouth daily.  150 tablet  2  . hydrochlorothiazide (HYDRODIURIL) 25 MG tablet Take 1 tablet (25 mg total) by mouth daily.  30 tablet  6  . ramipril  (ALTACE) 5 MG capsule Take 1 capsule (5 mg total) by mouth daily.  30 capsule  6    BP 192/77  Pulse 71  Ht 5\' 1"  (1.549 m)  Wt 62.596 kg (138 lb)  BMI 26.07 kg/m2 General: elderly, NAD Neck: No JVD, no thyromegaly or thyroid nodule.  Lungs: Prolonged expiratory phase.  CV: Nondisplaced PMI.  Heart regular S1/S2, no S3/S4, no murmur.  No peripheral edema.  No carotid bruit. Unable to palpate pedal pulses.  Abdomen: Soft, nontender, no hepatosplenomegaly, no distention.  Neurologic: Alert and oriented x 3.  Psych: Normal affect. Extremities: No clubbing or cyanosis.

## 2011-05-17 NOTE — Assessment & Plan Note (Signed)
Somewhat diffuse bilateral leg pain.  I am unable to palpate pedal pulses.  I will get a peripheral artery doppler US study to evaluate for significant PAD.

## 2011-05-17 NOTE — Assessment & Plan Note (Signed)
BP continues to run very high.  I will increase ramipril to 5 mg daily and HCTZ to 25 mg daily.  BMET and BP check in 2 wks.

## 2011-05-17 NOTE — Assessment & Plan Note (Signed)
Status post NSTEMI in 8/12.  Culprit vessel was a small, diffusely diseased RCA.  It was not amenable to intervention.  Lexiscan myoview in followup showed no ischemia in the LAD distribution (70% proximal LAD stenosis on cath).  She has not been having chest pain.  Continue statin, metoprolol, ramipril, and Imdur. She has been off aspirin since lower GI bleed.  As she has had no further overt bleeding for several [place], I am going to have her start back on ASA 81 mg daily.

## 2011-05-17 NOTE — Assessment & Plan Note (Signed)
Check lipids in 3 months with goal LDL < 70.

## 2011-05-17 NOTE — Assessment & Plan Note (Signed)
Needs to quit entirely. Could not tolerate Wellbutrin.  I encouraged her to try an electronic cigarette.

## 2011-05-24 ENCOUNTER — Encounter (INDEPENDENT_AMBULATORY_CARE_PROVIDER_SITE_OTHER): Payer: Medicare Other

## 2011-05-24 DIAGNOSIS — I2510 Atherosclerotic heart disease of native coronary artery without angina pectoris: Secondary | ICD-10-CM

## 2011-05-24 DIAGNOSIS — M79609 Pain in unspecified limb: Secondary | ICD-10-CM

## 2011-05-24 DIAGNOSIS — E1159 Type 2 diabetes mellitus with other circulatory complications: Secondary | ICD-10-CM

## 2011-05-24 DIAGNOSIS — I739 Peripheral vascular disease, unspecified: Secondary | ICD-10-CM

## 2011-05-31 ENCOUNTER — Other Ambulatory Visit: Payer: Medicare Other

## 2011-05-31 ENCOUNTER — Telehealth: Payer: Self-pay

## 2011-05-31 DIAGNOSIS — I2510 Atherosclerotic heart disease of native coronary artery without angina pectoris: Secondary | ICD-10-CM

## 2011-05-31 NOTE — Telephone Encounter (Signed)
The patient dropped off some blood pressure and heart rate readings for about one week. The readings range 2/13 188/68 HR 69 am, 187/84, 2/14-   167/55 HR 95 noon, 197/72 HR 73 6 pm, 2/15- 125/43 HR 53, 157/61 HR 55 6:30 pm, 2/16- 169/83 HR 83, 163/54 HR 54, 2 pm, 183/67 HR 51 7 pm, 2/17- 175/77 HR 88 9 am, 153/63 HR 62 2 pm,  2/20 145/59 HR 84 9 am, 2/21- 153/55 HR 85 8 am, 130/53 HR 61 12 noon. Ramipril was increased to 5 mg daily with the HCTZ to 25 mg daily at office visit on 05/16/2011. She was told to keep a reading of her blood pressures and drop off to the office.

## 2011-06-01 LAB — BASIC METABOLIC PANEL
BUN/Creatinine Ratio: 24 (ref 11–26)
BUN: 25 mg/dL (ref 8–27)
CO2: 27 mmol/L (ref 20–32)
Calcium: 9.1 mg/dL (ref 8.6–10.2)
Chloride: 101 mmol/L (ref 97–108)
Creatinine, Ser: 1.06 mg/dL — ABNORMAL HIGH (ref 0.57–1.00)
Glucose: 87 mg/dL (ref 65–99)

## 2011-06-03 NOTE — Telephone Encounter (Signed)
Increase ramipril to 10 mg daily with BMET and BP check in 2 wks.

## 2011-06-04 ENCOUNTER — Telehealth: Payer: Self-pay | Admitting: *Deleted

## 2011-06-04 ENCOUNTER — Telehealth: Payer: Self-pay | Admitting: Family Medicine

## 2011-06-04 MED ORDER — RAMIPRIL 10 MG PO CAPS: 10.0000 mg | ORAL_CAPSULE | Freq: Every day | ORAL | Status: DC

## 2011-06-04 NOTE — Telephone Encounter (Signed)
FORWARDED TO [facility] TRIAGE

## 2011-06-04 NOTE — Telephone Encounter (Signed)
Do you remember this patient conversation?

## 2011-06-04 NOTE — Telephone Encounter (Signed)
PT AWARE OF MED CHANGE AND THE NEED FOR BMET AND B/P CHECK IN 2 [place] . PER PT WILL CALL AND MAKE APPT FOR [place] OFFICE/CY

## 2011-06-04 NOTE — Telephone Encounter (Signed)
Pt

## 2011-06-04 NOTE — Telephone Encounter (Signed)
PT  AWARE OF MD CHANGE SEE OTHER PHONE NOTE .[name]

## 2011-06-04 NOTE — Telephone Encounter (Signed)
Sorry phone note was made in error.

## 2011-06-04 NOTE — Telephone Encounter (Signed)
Nothing in this note 

## 2011-06-04 NOTE — Telephone Encounter (Signed)
WHILE GIVING LAB AND ABI RESULTS  PT  READ OFF B/P READINGS  [id number]  AND HR  RANGING 67-99 PT AWARE  WILL FORWARD TO DR [facility] FOR REVIEW .Zack Seal

## 2011-06-05 ENCOUNTER — Encounter: Payer: Self-pay | Admitting: Family Medicine

## 2011-06-05 ENCOUNTER — Ambulatory Visit (INDEPENDENT_AMBULATORY_CARE_PROVIDER_SITE_OTHER): Payer: Medicare Other | Admitting: Family Medicine

## 2011-06-05 VITALS — BP 140/70 | HR 52 | Temp 98.1°F | Wt 139.5 lb

## 2011-06-05 DIAGNOSIS — J019 Acute sinusitis, unspecified: Secondary | ICD-10-CM

## 2011-06-05 MED ORDER — AMOXICILLIN-POT CLAVULANATE 875-125 MG PO TABS: 1.0000 | ORAL_TABLET | Freq: Two times a day (BID) | ORAL | Status: AC

## 2011-06-05 NOTE — Assessment & Plan Note (Signed)
With pharyngitis. Given duration and progression and comorbidities will cover for bacterial sinusitis. Treat with augmentin twice daily for 10 days Update [place] if not improving as expected.

## 2011-06-05 NOTE — Patient Instructions (Signed)
You have a sinus infection. Take medicine as prescribed: Treat with augmentin twice daily for 10 days Push fluids and plenty of rest. Nasal saline irrigation or neti pot to help drain sinuses. May use simple mucinex with plenty of fluid to help mobilize mucous. Let us know if fever >101.5, trouble opening/closing mouth, difficulty swallowing, or worsening - you may need to be seen again.

## 2011-06-05 NOTE — Progress Notes (Signed)
  Subjective:    Patient ID: [name], female    DOB: [birth year], 78 y.o.   MRN: [id number]  HPI CC: ST, cold  Has colonoscopy scheduled in 1 week.  1+ wk h/o sore throat, congestion, cold sxs.  Worse congestion at night.  ST getting worse.  + subjective fevers/chills.    Using vick salve, throat lozenges, lemons, gargling with salt water.  No cough, abd pain, n/v, ear or tooth pain, HA.  No sinus pressure.  No sick contacts at home.  No smokers at home.  No h/o asthma.  + COPD.  Poor dentition - working on getting this resolved.  Has paid for son's car - he is a heart transplant patient and on dialysi.  Review of Systems Per HPI    Objective:   Physical Exam  Nursing note and vitals reviewed. Constitutional: She appears well-developed and well-nourished. No distress.  HENT:  Head: Normocephalic and atraumatic.  Right Ear: Hearing, tympanic membrane, external ear and ear canal normal.  Left Ear: Hearing, tympanic membrane, external ear and ear canal normal.  Nose: Nose normal. No mucosal edema or rhinorrhea. Right sinus exhibits no maxillary sinus tenderness and no frontal sinus tenderness. Left sinus exhibits no maxillary sinus tenderness and no frontal sinus tenderness.  Mouth/Throat: Uvula is midline and mucous membranes are normal. Posterior oropharyngeal erythema present. No oropharyngeal exudate, posterior oropharyngeal edema or tonsillar abscesses.       Posterior tongue and throat erythema  Eyes: Conjunctivae and EOM are normal. Pupils are equal, round, and reactive to light. No scleral icterus.  Neck: Normal range of motion. Neck supple.  Cardiovascular: Normal rate, regular rhythm, normal heart sounds and intact distal pulses.   No murmur heard. Pulmonary/Chest: Effort normal and breath sounds normal. No respiratory distress. She has no wheezes. She has no rales.  Lymphadenopathy:    She has no cervical adenopathy.  Skin: Skin is warm and dry. No rash noted.         Assessment & Plan:

## 2011-06-12 ENCOUNTER — Ambulatory Visit: Payer: Self-pay | Admitting: Gastroenterology

## 2011-07-04 ENCOUNTER — Telehealth: Payer: Self-pay | Admitting: Family Medicine

## 2011-07-04 NOTE — Telephone Encounter (Signed)
Call  Office eval indicated to check, evaluate, and culture urine

## 2011-07-04 NOTE — Telephone Encounter (Signed)
Patient advised and appt made for tomorrow at 9:45

## 2011-07-04 NOTE — Telephone Encounter (Signed)
Pt has used medication for UTI (Azoid)??? It did not work and she was wondering if something else could be called in for her. [facility] on [address]

## 2011-07-05 ENCOUNTER — Ambulatory Visit (INDEPENDENT_AMBULATORY_CARE_PROVIDER_SITE_OTHER): Payer: Medicare Other | Admitting: Family Medicine

## 2011-07-05 ENCOUNTER — Encounter: Payer: Self-pay | Admitting: Family Medicine

## 2011-07-05 VITALS — BP 132/60 | HR 63 | Temp 98.7°F | Wt 136.0 lb

## 2011-07-05 DIAGNOSIS — R300 Dysuria: Secondary | ICD-10-CM

## 2011-07-05 DIAGNOSIS — N390 Urinary tract infection, site not specified: Secondary | ICD-10-CM

## 2011-07-05 LAB — POCT URINALYSIS DIPSTICK
Nitrite, UA: POSITIVE
Protein, UA: 100
Spec Grav, UA: 1.02
Urobilinogen, UA: NEGATIVE

## 2011-07-05 MED ORDER — SULFAMETHOXAZOLE-TRIMETHOPRIM 400-80 MG PO TABS: 1.0000 | ORAL_TABLET | Freq: Two times a day (BID) | ORAL | Status: AC

## 2011-07-05 NOTE — Progress Notes (Signed)
Had some diarrhea, had some pain with urination. Forcing fluid, had some diarrhea for about a day and a half.   Patient presents with burning, urgency. No vaginal discharge or external irritation.  No STD exposure. No abd pain, no flank pain.  ROS: GEN:  no fevers, chills. GI: No n/v/d, eating normally Otherwise, ROS is as per the HPI.  PHYSICAL EXAM  Blood pressure 132/60, pulse 63, temperature 98.7 F (37.1 C), temperature source Oral, weight 136 lb (61.689 kg).  GEN: WDWN, A&Ox4,NAD. Non-toxic HEENT: Atraumatc, normocephalic. CV: RRR, No M/G/R PULM: CTA B, No wheezes, crackles, or rhonchi ABD: S, NT, ND, +BS, no rebound. No CVAT. No suprapubic tenderness. EXT: No c/c/e  A/P: UTI. Rx with ABX as below  Urinalysis    Component Value Date/Time   COLORURINE yellow 03/09/2010 1006   APPEARANCEUR Clear 03/09/2010 1006   LABSPEC 1.025 03/09/2010 1006   PHURINE 6.0 03/09/2010 1006   GLUCOSEU NEGATIVE 09/13/2009 1547   HGBUR negative 03/09/2010 1006   HGBUR NEGATIVE 09/13/2009 1547   BILIRUBINUR NEG 07/05/2011 0948   BILIRUBINUR negative 03/09/2010 1006   KETONESUR NEGATIVE 09/13/2009 1547   PROTEINUR NEGATIVE 09/13/2009 1547   UROBILINOGEN negative 07/05/2011 0948   UROBILINOGEN 0.2 03/09/2010 1006   NITRITE POS 07/05/2011 0948   NITRITE negative 03/09/2010 1006   LEUKOCYTESUR large (3+) 07/05/2011 6213

## 2011-07-08 LAB — URINE CULTURE

## 2011-07-19 ENCOUNTER — Ambulatory Visit (INDEPENDENT_AMBULATORY_CARE_PROVIDER_SITE_OTHER): Payer: Medicare Other | Admitting: Family Medicine

## 2011-07-19 ENCOUNTER — Encounter: Payer: Self-pay | Admitting: Family Medicine

## 2011-07-19 VITALS — BP 130/82 | HR 54 | Temp 98.4°F | Ht <= 58 in | Wt 139.4 lb

## 2011-07-19 DIAGNOSIS — I2510 Atherosclerotic heart disease of native coronary artery without angina pectoris: Secondary | ICD-10-CM

## 2011-07-19 DIAGNOSIS — E119 Type 2 diabetes mellitus without complications: Secondary | ICD-10-CM

## 2011-07-19 DIAGNOSIS — D509 Iron deficiency anemia, unspecified: Secondary | ICD-10-CM

## 2011-07-19 DIAGNOSIS — I10 Essential (primary) hypertension: Secondary | ICD-10-CM

## 2011-07-19 DIAGNOSIS — I252 Old myocardial infarction: Secondary | ICD-10-CM

## 2011-07-19 DIAGNOSIS — K922 Gastrointestinal hemorrhage, unspecified: Secondary | ICD-10-CM

## 2011-07-19 DIAGNOSIS — K7689 Other specified diseases of liver: Secondary | ICD-10-CM

## 2011-07-19 DIAGNOSIS — E785 Hyperlipidemia, unspecified: Secondary | ICD-10-CM

## 2011-07-19 MED ORDER — AMOXICILLIN 500 MG PO CAPS: ORAL_CAPSULE | ORAL | Status: DC

## 2011-07-19 NOTE — Progress Notes (Signed)
Patient Name: [name] Date of Birth: 11-May-1931 Medical Record Number: [id number] Gender: female Date of Encounter: 07/19/2011  History of Present Illness:  [name] is a 78 y.o. very pleasant female patient who presents with the following:  Followup multiple med probs, recent complex hosp stay status post acute GI bleed, and the patient ultimately had a microembolization procedure by vascular surgery secondary to a diverticular bleed. H/o also significant for NSTEMI last year, occluded RCA, and 70% lesion of LAD, managed medically.  DM - no pills, has been stable without medication for a very long time.  Chol - stopped for a long time. Stopped statin, did not understand the importance of this post-MI. Patient felt that she did not need, since she has been doing much better with her diet.  HTN: Tolerating all medications without side effects Stable and at goal No HA.  BP Readings from Last 3 Encounters:  07/19/11 130/82  07/05/11 132/60  06/05/11 140/70    Basic Metabolic Panel:    Component Value Date/Time   NA 142 07/19/2011 1627   NA 140 05/31/2011 1038   K 3.5 07/19/2011 1627   CL 105 07/19/2011 1627   CO2 28 07/19/2011 1627   BUN 27* 07/19/2011 1627   BUN 25 05/31/2011 1038   CREATININE 1.3* 07/19/2011 1627   GLUCOSE 94 07/19/2011 1627   GLUCOSE 87 05/31/2011 1038   CALCIUM 9.7 07/19/2011 1627    Anemia: f/u CBC is needed.  Feet are hurting her a lot. Mostly on the metatarsal area, this is her primary complaint today.  B shoulder replacement - prior joint replacement.   Patient Active Problem List  Diagnoses  . DIABETES MELLITUS, TYPE II  . HYPERLIPIDEMIA  . ANEMIA-IRON DEFICIENCY  . ANXIETY DEPRESSION  . INSOMNIA, CHRONIC  . HYPERTENSION  . CORONARY ARTERY DISEASE  . COPD  . DIVERTICULOSIS OF COLON  . FATTY LIVER DISEASE  . ECZEMA  . RHEUMATOID ARTHRITIS  . OSTEOARTHRITIS  . LEG PAIN, BILATERAL  . OSTEOPOROSIS  . ARTERIOVENOUS MALFORMATION,  GASTRIC  . FATIGUE  . AAA (abdominal aortic aneurysm)  . Smoking  . GIB (gastrointestinal bleeding)  . Sinusitis acute   Past Medical History  Diagnosis Date  . Hypertension 08/01/1995  . Rheumatoid arthritis 04/03/1983  . Osteoarthritis 04/03/1983  . Anemia, deficiency 07/01/2004    (w/u by Dr. Lorre Nick)  . Diabetes mellitus type II 07/31/1997  . Osteoporosis 12/31/2000  . CAD (coronary artery disease) 09/01/2002    s/p NSTEMI 2012  . Hyperlipemia 07/02/1999  . COPD (chronic obstructive pulmonary disease) 05/31/2004  . diverticulosis of colon   . Anxiety and depression   . Arteriovenous malformation of gastrointestinal tract   . Fatty liver   . Chronic back pain    Past Surgical History  Procedure Date  . Nasal sinus surgery   . Inguinal hernia repair   . Partial hysterectomy     ovaries intact, dysmennorhea w/ A/P repari  . Carpal tunnel release     bilat  . Trigger finger release     right  . Wrist surgery     neuroma repair, right  . Ankle surgery     neuroma repair, right ORIF  . Total shoulder replacement 12/13/1998    right, ([facility])  . Rotator cuff repair 11/22/2003    ([facility]) left  . Epidural block injection 05/2005    x 3  . Long finger tendon realignment 10/31/2005    [facility]  . Lumbar epidural 06/13/2006  .  Laminectomy 06/2006    L4/5 spinal stenosis (Dr. [name])  . Laminectomy 02/24/2008    L3/4 Ant/Lat interbody fusion and Lat Artthrodesis w/plate using XLIF (Dr. [name])  . Colonic embolization 04/18/2011    Dr. Wyn Quaker, acute GI bleed   History  Substance Use Topics  . Smoking status: Current Some Day Smoker -- 62 years    Types: Cigarettes  . Smokeless tobacco: Never Used   Comment: Smokes a few puffs occasionally  . Alcohol Use: Yes     red wine occassionally   Family History  Problem Relation Age of Onset  . Hypotension Mother   . Dementia Mother   . Alcohol abuse Father   . Heart disease Father     MI, enlarged heart  . Diabetes  Brother    Allergies  Allergen Reactions  . Ciprofloxacin     REACTION: UNSPECIFIED  . Iron   . Morphine Sulfate Nausea And Vomiting    At high doses  . Naproxen     REACTION: UNSPECIFIED    Medication list has been reviewed and updated.  Review of Systems: C/o occ chest pain, relieved with NTG. No acute chest pain, occ sob. Has upcoming cards recheck in a few [place]. No fevers or chills recently  Physical Examination: Filed Vitals:   07/19/11 1507  BP: 130/82  Pulse: 54  Temp: 98.4 F (36.9 C)  TempSrc: Oral  Height: 4\' 6"  (1.372 m)  Weight: 139 lb 6.4 oz (63.231 kg)  SpO2: 98%    Body mass index is 33.61 kg/(m^2).   GEN: WDWN, NAD, Non-toxic, A & O x 3 HEENT: Atraumatic, Normocephalic. Neck supple. No masses, No LAD. Ears and Nose: No external deformity. CV: RRR, No M/G/R. No JVD. No thrill. No extra heart sounds. PULM: CTA B, no wheezes, crackles, rhonchi. No retractions. No resp. distress. No accessory muscle use. EXTR: No c/c/e NEURO Normal gait.  PSYCH: Normally interactive. Conversant. Not depressed or anxious appearing.  Calm demeanor.    Assessment and Plan:  1. ANEMIA-IRON DEFICIENCY  CBC with Differential  2. HYPERLIPIDEMIA : restart lipitor   3. DIABETES MELLITUS, TYPE II  Basic metabolic panel, Hemoglobin A1c, Microalbumin / creatinine urine ratio  4. HYPERTENSION    5. CORONARY ARTERY DISEASE : on asa, ace, b-blocker, statin   6. FATTY LIVER DISEASE  Hepatic function panel  7. GIB (gastrointestinal bleeding) : check CBC   8. History of non-ST elevation myocardial infarction (NSTEMI)     As above. Placed MT bars on sports insoles for her for shoes. Custom crafted.  Reviewed recs from AAOS and ADA, which no longer rec routine abx before dental work post-joint replacement. Patient clear she was told this, given Amox and asked her to check with [facility] faculty who are performing the procedure.  Orders Today: Orders Placed This Encounter    Procedures  . Basic metabolic panel  . CBC with Differential  . Hemoglobin A1c  . Microalbumin / creatinine urine ratio  . Hepatic function panel    Medications Today: Meds ordered this encounter  Medications  . amoxicillin (AMOXIL) 500 MG capsule    Sig: Take 4 caps 1 hour before dental work    Dispense:  4 capsule    Refill:  0

## 2011-07-20 LAB — BASIC METABOLIC PANEL
BUN: 27 mg/dL — ABNORMAL HIGH (ref 6–23)
CO2: 28 mEq/L (ref 19–32)
Calcium: 9.7 mg/dL (ref 8.4–10.5)
Chloride: 105 mEq/L (ref 96–112)
Creatinine, Ser: 1.3 mg/dL — ABNORMAL HIGH (ref 0.4–1.2)
GFR: 41.05 mL/min — ABNORMAL LOW (ref >60.00)
Glucose, Bld: 94 mg/dL (ref 70–99)
Potassium: 3.5 mEq/L (ref 3.5–5.1)
Sodium: 142 mEq/L (ref 135–145)

## 2011-07-20 LAB — CBC WITH DIFFERENTIAL/PLATELET
Basophils Absolute: 0 10*3/uL (ref 0.0–0.1)
Basophils Relative: 0.6 % (ref 0.0–3.0)
Eosinophils Absolute: 0.2 10*3/uL (ref 0.0–0.7)
Eosinophils Relative: 2.6 % (ref 0.0–5.0)
HCT: 32.8 % — ABNORMAL LOW (ref 36.0–46.0)
Hemoglobin: 11.1 g/dL — ABNORMAL LOW (ref 12.0–15.0)
Lymphocytes Relative: 29.9 % (ref 12.0–46.0)
Lymphs Abs: 2 10*3/uL (ref 0.7–4.0)
MCHC: 33.8 g/dL (ref 30.0–36.0)
MCV: 91.4 fl (ref 78.0–100.0)
Monocytes Absolute: 0.5 10*3/uL (ref 0.1–1.0)
Monocytes Relative: 8.1 % (ref 3.0–12.0)
Neutro Abs: 3.9 10*3/uL (ref 1.4–7.7)
Neutrophils Relative %: 58.8 % (ref 43.0–77.0)
Platelets: 235 10*3/uL (ref 150.0–400.0)
RBC: 3.58 Mil/uL — ABNORMAL LOW (ref 3.87–5.11)
RDW: 13.7 % (ref 11.5–14.6)
WBC: 6.6 10*3/uL (ref 4.5–10.5)

## 2011-07-20 LAB — HEMOGLOBIN A1C: Hgb A1c MFr Bld: 5.9 % (ref 4.6–6.5)

## 2011-07-20 LAB — HEPATIC FUNCTION PANEL
ALT: 15 U/L (ref 0–35)
AST: 22 U/L (ref 0–37)
Albumin: 3.9 g/dL (ref 3.5–5.2)

## 2011-07-20 LAB — MICROALBUMIN / CREATININE URINE RATIO: Microalb Creat Ratio: 0.3 mg/g (ref 0.0–30.0)

## 2011-07-22 ENCOUNTER — Encounter: Payer: Self-pay | Admitting: Family Medicine

## 2011-07-22 DIAGNOSIS — I252 Old myocardial infarction: Secondary | ICD-10-CM | POA: Insufficient documentation

## 2011-07-23 ENCOUNTER — Encounter: Payer: Self-pay | Admitting: *Deleted

## 2011-07-31 ENCOUNTER — Encounter: Payer: Self-pay | Admitting: Family Medicine

## 2011-07-31 ENCOUNTER — Ambulatory Visit (INDEPENDENT_AMBULATORY_CARE_PROVIDER_SITE_OTHER): Payer: Medicare Other | Admitting: Family Medicine

## 2011-07-31 VITALS — BP 140/80 | HR 68 | Temp 98.0°F | Wt 136.0 lb

## 2011-07-31 DIAGNOSIS — Z23 Encounter for immunization: Secondary | ICD-10-CM

## 2011-07-31 DIAGNOSIS — T63301A Toxic effect of unspecified spider venom, accidental (unintentional), initial encounter: Secondary | ICD-10-CM

## 2011-07-31 DIAGNOSIS — T6391XA Toxic effect of contact with unspecified venomous animal, accidental (unintentional), initial encounter: Secondary | ICD-10-CM

## 2011-07-31 DIAGNOSIS — T63391A Toxic effect of venom of other spider, accidental (unintentional), initial encounter: Secondary | ICD-10-CM

## 2011-07-31 MED ORDER — SULFAMETHOXAZOLE-TRIMETHOPRIM 800-160 MG PO TABS: 1.0000 | ORAL_TABLET | Freq: Two times a day (BID) | ORAL | Status: AC

## 2011-07-31 MED ORDER — HYDROXYZINE HCL 10 MG PO TABS: 10.0000 mg | ORAL_TABLET | Freq: Three times a day (TID) | ORAL | Status: AC | PRN

## 2011-07-31 NOTE — Progress Notes (Signed)
Subjective:    Patient ID: [name], female    DOB: Dec 22, 1931, 78 y.o.   MRN: [id number]  HPI  78 yo here for spider bite.  Was bitten last night by a small spider- she was in bed.  Per pt, "was not a black widow." Since then the area is very red, warm to touch and extremely itchy. No tingling.   No fevers. No n/v/d.  Overdue for tetanus immunization.  Patient Active Problem List  Diagnoses  . DIABETES MELLITUS, TYPE II  . HYPERLIPIDEMIA  . ANEMIA-IRON DEFICIENCY  . ANXIETY DEPRESSION  . INSOMNIA, CHRONIC  . HYPERTENSION  . CAD (coronary artery disease)  . COPD  . DIVERTICULOSIS OF COLON  . FATTY LIVER DISEASE  . ECZEMA  . Rheumatoid arthritis  . OSTEOARTHRITIS  . LEG PAIN, BILATERAL  . OSTEOPOROSIS  . ARTERIOVENOUS MALFORMATION, GASTRIC  . FATIGUE  . AAA (abdominal aortic aneurysm)  . Smoking  . GIB (gastrointestinal bleeding)  . History of non-ST elevation myocardial infarction (NSTEMI)   Past Medical History  Diagnosis Date  . Hypertension 08/01/1995  . Rheumatoid arthritis 04/03/1983  . Osteoarthritis 04/03/1983  . Anemia, deficiency 07/01/2004    (w/u by Dr. Lorre Nick)  . Diabetes mellitus type II 07/31/1997  . Osteoporosis 12/31/2000  . CAD (coronary artery disease) 09/01/2002    s/p NSTEMI 2012  . Hyperlipemia 07/02/1999  . COPD (chronic obstructive pulmonary disease) 05/31/2004  . diverticulosis of colon   . Anxiety and depression   . Arteriovenous malformation of gastrointestinal tract   . Fatty liver   . Chronic back pain   . History of non-ST elevation myocardial infarction (NSTEMI) 07/22/2011   Past Surgical History  Procedure Date  . Nasal sinus surgery   . Inguinal hernia repair   . Partial hysterectomy     ovaries intact, dysmennorhea w/ A/P repari  . Carpal tunnel release     bilat  . Trigger finger release     right  . Wrist surgery     neuroma repair, right  . Ankle surgery     neuroma repair, right ORIF  . Total shoulder  replacement 12/13/1998    right, ([facility])  . Rotator cuff repair 11/22/2003    ([facility]) left  . Epidural block injection 05/2005    x 3  . Long finger tendon realignment 10/31/2005    [facility]  . Lumbar epidural 06/13/2006  . Laminectomy 06/2006    L4/5 spinal stenosis (Dr. [name])  . Laminectomy 02/24/2008    L3/4 Ant/Lat interbody fusion and Lat Artthrodesis w/plate using XLIF (Dr. [name])  . Colonic embolization 04/18/2011    Dr. Wyn Quaker, acute GI bleed   History  Substance Use Topics  . Smoking status: Current Some Day Smoker -- 76 years    Types: Cigarettes  . Smokeless tobacco: Never Used   Comment: Smokes a few puffs occasionally  . Alcohol Use: Yes     red wine occassionally   Family History  Problem Relation Age of Onset  . Hypotension Mother   . Dementia Mother   . Alcohol abuse Father   . Heart disease Father     MI, enlarged heart  . Diabetes Brother    Allergies  Allergen Reactions  . Ciprofloxacin     REACTION: UNSPECIFIED  . Iron   . Morphine Sulfate Nausea And Vomiting    At high doses  . Naproxen     REACTION: UNSPECIFIED   Current Outpatient Prescriptions on File Prior to Visit  Medication Sig Dispense Refill  . ALPRAZolam (XANAX) 0.5 MG tablet Take 1 tablet (0.5 mg total) by mouth at bedtime as needed.  30 tablet  5  . amoxicillin (AMOXIL) 500 MG capsule Take 4 caps 1 hour before dental work  4 capsule  0  . aspirin EC 81 MG tablet Take 1 tablet (81 mg total) by mouth daily.  150 tablet  2  . B Complex Vitamins (B COMPLEX 50 PO) Take 1 tablet by mouth daily.      . Calcium Carbonate-Vit D-Min (CALCIUM 600+D PLUS MINERALS) 600-400 MG-UNIT TABS Take 1 tablet by mouth daily.        [name] guaiFENesin (MUCINEX) 600 MG 12 hr tablet Take 1,200 mg by mouth as needed.        . hydrochlorothiazide (HYDRODIURIL) 25 MG tablet Take 1 tablet (25 mg total) by mouth daily.  30 tablet  6  . metoprolol tartrate (LOPRESSOR) 25 MG tablet Take 25 mg by mouth 2 (two) times  daily.        . nitroGLYCERIN (NITROSTAT) 0.4 MG SL tablet Place 0.4 mg under the tongue every 5 (five) minutes as needed.        [name] oxyCODONE-acetaminophen (PERCOCET) 10-325 MG per tablet Take 1 tablet by mouth every 6 (six) hours as needed.        [name] POTASSIUM GLUCONATE PO Take 1 capsule by mouth daily.        . Probiotic Product (HEALTHY COLON PO) Take by mouth daily.        . ramipril (ALTACE) 10 MG capsule Take 1 capsule (10 mg total) by mouth daily.  30 capsule  11  . Saline (OCEAN NASAL SPRAY NA) by Nasal route as needed.        [name] DISCONTD: metoprolol tartrate (LOPRESSOR) 50 MG tablet Take 1 tablet (50 mg total) by mouth 2 (two) times daily.  60 tablet  11   The PMH, PSH, Social History, Family History, Medications, and allergies have been reviewed in [facility], and have been updated if relevant.    Review of Systems See HPI    Objective:   Physical Exam BP 140/80  Pulse 68  Temp(Src) 98 F (36.7 C) (Oral)  Wt 136 lb (61.689 kg) Gen:  Alert, pleasant, NAD Skin:  Small pinpoint "bite" mark with large area of surrounding erythema and warmth, no areas of necrosis.    Assessment & Plan:  1.  Spider bite- Advised supportive care. See pt instructions. Td given today. Bactrim DS - 1 tab twice daily x 10 days. Atarax as needed for itching.

## 2011-07-31 NOTE — Progress Notes (Signed)
Addended by: Eliezer Bottom on: 07/31/2011 12:16 PM   Modules accepted: Orders

## 2011-07-31 NOTE — Patient Instructions (Addendum)
Spider Bite Spider bites are not common. Most spider bites do not cause serious problems. The elderly, very young children, and people with certain existing medical conditions are more likely to experience significant symptoms. SYMPTOMS  Spider bites may not cause any pain at first. Within 1 or 2 days of the bite, there may be swelling, redness, and pain in the bite area. However, some spider bites can cause pain within the first hour. TREATMENT   Take antibiotic as directed- Bactrim DS- 1 tablet twice daily for 10 days. Atarax as needed for itching. Follow up with Dr. Patsy Lager later this week.  HOME CARE INSTRUCTIONS  Do not scratch the bite area.   Keep the bite area clean and dry. Wash the area with soap and water as directed.   Put ice or cool compresses on the bite area.   Put ice in a plastic bag.   Place a towel between your skin and the bag.   Leave the ice on for 20 minutes, 4 times a day for the first 2 to 3 days, or as directed.   Keep the bite area elevated above the level of your heart. This helps reduce redness and swelling.   Only take over-the-counter or prescription medicines as directed by your caregiver.   If you are given antibiotics, take them as directed. Finish them even if you start to feel better.   If you get a tetanus shot, your arm may swell, get red, and feel warm to the touch. This is common and not a problem. If you need a tetanus shot and you choose not to have one, there is a rare chance of getting tetanus. Sickness from tetanus can be serious. SEEK MEDICAL CARE IF: Your bite is not better after 3 days of treatment. SEEK IMMEDIATE MEDICAL CARE IF:  Your bite turns purple or develops increased swelling, pain, or redness.   You develop shortness of breath or chest pain.   You have muscle cramps or painful muscle spasms.   You develop abdominal pain, nausea, or vomiting.   You feel unusually tired or sleepy.  MAKE SURE YOU:  Understand these  instructions.   Will watch your condition.   Will get help right away if you are not doing well or get worse.  Document Released: 04/26/2004 Document Revised: 03/08/2011 Document Reviewed: 10/18/2010 [facility] Patient Information 2012 [place], [place].

## 2011-08-28 ENCOUNTER — Ambulatory Visit (INDEPENDENT_AMBULATORY_CARE_PROVIDER_SITE_OTHER): Payer: Medicare Other | Admitting: Cardiology

## 2011-08-28 ENCOUNTER — Encounter: Payer: Self-pay | Admitting: Cardiology

## 2011-08-28 VITALS — BP 104/54 | HR 54 | Ht <= 58 in | Wt 137.0 lb

## 2011-08-28 DIAGNOSIS — I10 Essential (primary) hypertension: Secondary | ICD-10-CM

## 2011-08-28 DIAGNOSIS — F17200 Nicotine dependence, unspecified, uncomplicated: Secondary | ICD-10-CM

## 2011-08-28 DIAGNOSIS — I2510 Atherosclerotic heart disease of native coronary artery without angina pectoris: Secondary | ICD-10-CM

## 2011-08-28 DIAGNOSIS — E785 Hyperlipidemia, unspecified: Secondary | ICD-10-CM

## 2011-08-28 NOTE — Patient Instructions (Signed)
Your physician recommends that you return for a FASTING lipid profile /liver profile/BMET in 2 months--you can schedule this to be done in [place].  Your physician wants you to follow-up in: 6 months with Dr Shirlee Latch. (November 2013). You will receive a reminder letter in the mail two months in advance. If you don't receive a letter, please call our office to schedule the follow-up appointment.

## 2011-08-30 NOTE — Assessment & Plan Note (Signed)
I again strongly encouraged to quit altogether.

## 2011-08-30 NOTE — Assessment & Plan Note (Signed)
Check lipids/LFTs in 2 months after she has been on statin regularly.  Goal LDL < 70.

## 2011-08-30 NOTE — Assessment & Plan Note (Signed)
BP is now well-controlled.

## 2011-08-30 NOTE — Assessment & Plan Note (Signed)
Status post NSTEMI in 8/12.  Culprit vessel was a small, diffusely diseased RCA.  It was not amenable to intervention.  Lexiscan myoview in followup showed no ischemia in the LAD distribution (70% proximal LAD stenosis on cath).  She has not been having chest pain.  Continue statin, ASA 81, metoprolol, ramipril, and Imdur. No further GI bleeding.  I encouraged her not to stop her statin again.

## 2011-08-30 NOTE — Progress Notes (Signed)
PCP: [name]  78 yo with history of COPD, HTN, diabetes, and CAD presents for cardiology followup.  Patient was admitted to [facility] with chest pain in 8/12.  Troponin was mildly elevated.  Left heart cath showed severe, diffuse disease in a small RCA that was not amenable to intervention.  This was the likely culprit vessel.  She also had a 70% LAD stenosis.  [name] was then done to assess for ischemia in the LAD distribution.  This showed only a fixed apical perfusion defect with no ischemia (low risk).  She was admitted to [facility] in 1/13 with a lower GI bleed that was thought to be diverticular.  She required blood transfusion.  She was off aspirin briefly.  Lately, she has been doing well.  She exercises at the [facility] and line dances.  No exertional dyspnea or chest pain. She continues to smoke a few cigarettes a day. BP is under better control.  She stopped taking atorvastatin because she had been "working on her diet" but after talking to Dr. Patsy Lager recently, she restarted it.   Labs (8/12): LDL 58, HDL 22 Labs (11/12): LDL 54, HDl 32, creatinine 0.8 Labs (4/13): K 3.5, creatinine 1.3  PMH: 1. COPD 2. HTN 3. Diabetes mellitus type II 4. ? AAA: Patient says she was told by [name] that she has an aneurysm. Abdominal US (10/12) showed no AAA.  5. Chronic low back pain 6. Osteoarthritis 7. Anemia  8. Excision of adrenal mass in 2007 9. Restless leg syndrome. 10. Bilateral shoulder replacements.  11. CAD: Cath in 2004 with moderate nonobstructive disease.  Patient was admitted to [facility] in 8/12 with NSTEMI.  LHC (8/12) showed severe diffuse disease of a small RCA.  This was the culprit lesion but it was too small and diffusely diseased for intervention.  There was a 70% proximal LAD stenosis.  Lexiscan myoview (9/12) to assess for LAD territory ischemia showed a small fixed apical defect, low risk.  EF 65% by myoview.  Echo (8/12): EF 60%, mild LV hypertrophy, normal RV size and  systolic function, no regional wall motion abnormalities.   12. Diverticular bleed 1/13 requiring microembolization by vascular surgery.  13. ABIs (2/13) within normal limits.   SH: Quit smoking in 9/12 after NSTEMI.  Now restarted.  Lives alone in [place].    FH: Father with MI/CHF, died in his 33s.    Current Outpatient Prescriptions  Medication Sig Dispense Refill  . ALPRAZolam (XANAX) 0.5 MG tablet Take 1 tablet (0.5 mg total) by mouth at bedtime as needed.  30 tablet  5  . amoxicillin (AMOXIL) 500 MG capsule Take 4 caps 1 hour before dental work  4 capsule  0  . aspirin EC 81 MG tablet Take 1 tablet (81 mg total) by mouth daily.  150 tablet  2  . B Complex Vitamins (B COMPLEX 50 PO) Take 1 tablet by mouth daily.      . Calcium Carbonate-Vit D-Min (CALCIUM 600+D PLUS MINERALS) 600-400 MG-UNIT TABS Take 1 tablet by mouth daily.        [name] guaiFENesin (MUCINEX) 600 MG 12 hr tablet Take 1,200 mg by mouth as needed.        . hydrochlorothiazide (HYDRODIURIL) 25 MG tablet Take 1 tablet (25 mg total) by mouth daily.  30 tablet  6  . metoprolol tartrate (LOPRESSOR) 25 MG tablet Take 25 mg by mouth 2 (two) times daily.        . nitroGLYCERIN (NITROSTAT) 0.4  MG SL tablet Place 0.4 mg under the tongue every 5 (five) minutes as needed.        [name] oxyCODONE-acetaminophen (PERCOCET) 10-325 MG per tablet Take 1 tablet by mouth every 6 (six) hours as needed.        [name] POTASSIUM GLUCONATE PO Take 1 capsule by mouth daily.        . Probiotic Product (HEALTHY COLON PO) Take by mouth daily.        . ramipril (ALTACE) 10 MG capsule Take 1 capsule (10 mg total) by mouth daily.  30 capsule  11  . Saline (OCEAN NASAL SPRAY NA) by Nasal route as needed.        [name] atorvastatin (LIPITOR) 40 MG tablet Take 1 tablet (40 mg total) by mouth daily.      [name] DISCONTD: metoprolol tartrate (LOPRESSOR) 50 MG tablet Take 1 tablet (50 mg total) by mouth 2 (two) times daily.  60 tablet  11    BP 104/54  Pulse 54  Ht 4\' 10"   (1.473 m)  Wt 62.143 kg (137 lb)  BMI 28.63 kg/m2 General: elderly, NAD Neck: No JVD, no thyromegaly or thyroid nodule.  Lungs: Prolonged expiratory phase, occasional rhonchi.  CV: Nondisplaced PMI.  Heart regular S1/S2, no S3/S4, no murmur.  No peripheral edema.  No carotid bruit. Unable to palpate pedal pulses.  Abdomen: Soft, nontender, no hepatosplenomegaly, no distention.  Neurologic: Alert and oriented x 3.  Psych: Normal affect. Extremities: No clubbing or cyanosis.

## 2011-09-11 ENCOUNTER — Other Ambulatory Visit: Payer: Self-pay | Admitting: *Deleted

## 2011-09-11 MED ORDER — ALPRAZOLAM 0.5 MG PO TABS: 0.5000 mg | ORAL_TABLET | Freq: Every evening | ORAL | Status: DC | PRN

## 2011-09-11 NOTE — Telephone Encounter (Signed)
rx called to pharmacy 

## 2011-09-11 NOTE — Telephone Encounter (Signed)
Ok to refill 30, 5 refills 

## 2011-09-12 ENCOUNTER — Ambulatory Visit (INDEPENDENT_AMBULATORY_CARE_PROVIDER_SITE_OTHER): Payer: Medicare Other | Admitting: Family Medicine

## 2011-09-12 ENCOUNTER — Encounter: Payer: Self-pay | Admitting: Family Medicine

## 2011-09-12 VITALS — BP 120/72 | HR 51 | Temp 98.1°F | Ht <= 58 in | Wt 138.5 lb

## 2011-09-12 DIAGNOSIS — R509 Fever, unspecified: Secondary | ICD-10-CM

## 2011-09-12 DIAGNOSIS — T148 Other injury of unspecified body region: Secondary | ICD-10-CM

## 2011-09-12 DIAGNOSIS — T148XXA Other injury of unspecified body region, initial encounter: Secondary | ICD-10-CM

## 2011-09-12 DIAGNOSIS — R6883 Chills (without fever): Secondary | ICD-10-CM

## 2011-09-12 DIAGNOSIS — W57XXXA Bitten or stung by nonvenomous insect and other nonvenomous arthropods, initial encounter: Secondary | ICD-10-CM

## 2011-09-12 MED ORDER — DOXYCYCLINE HYCLATE 100 MG PO TABS: 100.0000 mg | ORAL_TABLET | Freq: Two times a day (BID) | ORAL | Status: AC

## 2011-09-12 NOTE — Progress Notes (Signed)
Nature conservation officer at [facility] [address] [place] [address] Phone: [phone] Fax: [phone]   Patient Name: [name] Date of Birth: 10-Oct-1931 Age: 78 y.o. Medical Record Number: [id number] Gender: female Date of Encounter: 09/12/2011  History of Present Illness:  [name] is a 78 y.o. very pleasant female patient who presents with the following:  Feels terrible --- got 5 ticks off her the [place] before last.  Then got one on her temple.   Terrible all over. A little bit of some sore throat and thinks maybe a virus.  Generally just feels poorly, has no energy. She has had a fever. She is also had some chills. She does not feel like she is wanted to do anything at all. She has discontinued or greatly diminished her smoking so she is smoking only one cigarette a day. Great fatigue   Past Medical History, Surgical History, Social History, Family History, Problem List, Medications, and Allergies have been reviewed and updated if relevant.  Prior to Admission medications   Medication Sig Start Date End Date Taking? Authorizing Provider  ALPRAZolam Prudy Feeler) 0.5 MG tablet Take 1 tablet (0.5 mg total) by mouth at bedtime as needed. 09/11/11  Yes Hannah Beat, MD  aspirin EC 81 MG tablet Take 1 tablet (81 mg total) by mouth daily. 05/16/11  Yes [name], MD  atorvastatin (LIPITOR) 40 MG tablet Take 1 tablet (40 mg total) by mouth daily. 08/28/11  Yes [name], MD  B Complex Vitamins (B COMPLEX 50 PO) Take 1 tablet by mouth daily.   Yes Historical Provider, MD  Calcium Carbonate-Vit D-Min (CALCIUM 600+D PLUS MINERALS) 600-400 MG-UNIT TABS Take 1 tablet by mouth daily.     Yes Historical Provider, MD  guaiFENesin (MUCINEX) 600 MG 12 hr tablet Take 1,200 mg by mouth as needed.     Yes Historical Provider, MD  hydrochlorothiazide (HYDRODIURIL) 25 MG tablet Take 1 tablet (25 mg total) by mouth daily. 05/16/11 05/15/12 Yes [name], MD    metoprolol tartrate (LOPRESSOR) 25 MG tablet Take 25 mg by mouth 2 (two) times daily.     Yes Historical Provider, MD  nitroGLYCERIN (NITROSTAT) 0.4 MG SL tablet Place 0.4 mg under the tongue every 5 (five) minutes as needed.     Yes Historical Provider, MD  oxyCODONE-acetaminophen (PERCOCET) 10-325 MG per tablet Take 1 tablet by mouth every 6 (six) hours as needed.     Yes Historical Provider, MD  POTASSIUM GLUCONATE PO Take 1 capsule by mouth daily.     Yes Historical Provider, MD  Probiotic Product (HEALTHY COLON PO) Take by mouth daily.     Yes Historical Provider, MD  ramipril (ALTACE) 10 MG capsule Take 1 capsule (10 mg total) by mouth daily. 06/04/11 06/03/12 Yes [name], MD  [facility] NASAL SPRAY NA) by Nasal route as needed.     Yes Historical Provider, MD    Review of Systems: ROS: GEN: Acute illness details above GI: Tolerating PO intake GU: maintaining adequate hydration and urination Pulm: No SOB Interactive and getting along well at home.  Otherwise, ROS is as per the HPI.   Physical Examination: Filed Vitals:   09/12/11 1134  BP: 120/72  Pulse: 51  Temp: 98.1 F (36.7 C)   Filed Vitals:   09/12/11 1134  Height: 4\' 10"  (1.473 m)  Weight: 138 lb 8 oz (62.823 kg)   Body mass index is 28.95 kg/(m^2).   GEN:  A and O x 3. WDWN. NAD.    ENT: Nose clear, ext NML.  No LAD.  No JVD.  TM's clear. Oropharynx clear.  PULM: Normal WOB, no distress. No crackles, wheezes, rhonchi. CV: RRR, no M/G/R, No rubs, No JVD.   EXT: warm and well-perfused, No c/c/e. PSYCH: Pleasant and conversant.   Assessment and Plan: 1. Fever   2. Tick bite   3. Chills     Cover for tick borne illness given 3 ticks buried in skin over the last week  Meds ordered this encounter  Medications  . doxycycline (VIBRA-TABS) 100 MG tablet    Sig: Take 1 tablet (100 mg total) by mouth 2 (two) times daily.    Dispense:  20 tablet    Refill:  0     Hannah Beat, MD

## 2011-09-21 ENCOUNTER — Telehealth: Payer: Self-pay | Admitting: Family Medicine

## 2011-09-21 NOTE — Telephone Encounter (Signed)
error 

## 2011-09-24 ENCOUNTER — Ambulatory Visit (INDEPENDENT_AMBULATORY_CARE_PROVIDER_SITE_OTHER)
Admission: RE | Admit: 2011-09-24 | Discharge: 2011-09-24 | Disposition: A | Discharge status: Home / Self Care | Payer: Medicare Other | Source: Ambulatory Visit | Attending: Family Medicine | Admitting: Family Medicine

## 2011-09-24 ENCOUNTER — Encounter: Payer: Self-pay | Admitting: Family Medicine

## 2011-09-24 ENCOUNTER — Ambulatory Visit (INDEPENDENT_AMBULATORY_CARE_PROVIDER_SITE_OTHER): Payer: Medicare Other | Admitting: Family Medicine

## 2011-09-24 VITALS — BP 110/60 | HR 55 | Temp 98.3°F | Ht <= 58 in | Wt 136.0 lb

## 2011-09-24 DIAGNOSIS — R05 Cough: Secondary | ICD-10-CM

## 2011-09-24 DIAGNOSIS — J159 Unspecified bacterial pneumonia: Secondary | ICD-10-CM

## 2011-09-24 DIAGNOSIS — J441 Chronic obstructive pulmonary disease with (acute) exacerbation: Secondary | ICD-10-CM

## 2011-09-24 MED ORDER — PREDNISONE 20 MG PO TABS: ORAL_TABLET | ORAL | Status: DC

## 2011-09-24 MED ORDER — AMOXICILLIN 500 MG PO CAPS: 1000.0000 mg | ORAL_CAPSULE | Freq: Two times a day (BID) | ORAL | Status: AC

## 2011-09-24 NOTE — Progress Notes (Signed)
Nature conservation officer at [facility] [address] [place] [address] Phone: [phone] Fax: [phone]   Patient Name: [name] Date of Birth: 04/12/1931 Age: 78 y.o. Medical Record Number: [id number] Gender: female Date of Encounter: 09/24/2011  History of Present Illness:  [name] is a 78 y.o. very pleasant female patient who presents with the following:  Went and saw grandson and he was sick.   Grandson with 2 heart transplants - visited him in the hospital. PCP - pneumonia.  After the patient was in the hospital, she developed some sickness and illness. I saw her a little bit over 2 [place] ago, and we were worried that she could have had some tickborne illness given that she had pulled off to multiple sticks as well. She was having some arthralgia, but she has is essentially all the time anyway.  Now she presents and she is having worsening in her cough, some trouble taking a deep breath, and wheezing. She does continue to smoke, though she is not currently doing so right now. She has been a long-time smoker for approximately 70 years.  Past Medical History, Surgical History, Social History, Family History, Problem List, Medications, and Allergies have been reviewed and updated if relevant.  Prior to Admission medications   Medication Sig Start Date End Date Taking? Authorizing Provider  ALPRAZolam Prudy Feeler) 0.5 MG tablet Take 1 tablet (0.5 mg total) by mouth at bedtime as needed. 09/11/11  Yes Hannah Beat, MD  aspirin EC 81 MG tablet Take 1 tablet (81 mg total) by mouth daily. 05/16/11  Yes [name], MD  atorvastatin (LIPITOR) 40 MG tablet Take 1 tablet (40 mg total) by mouth daily. 08/28/11  Yes [name], MD  B Complex Vitamins (B COMPLEX 50 PO) Take 1 tablet by mouth daily.   Yes Historical Provider, MD  Calcium Carbonate-Vit D-Min (CALCIUM 600+D PLUS MINERALS) 600-400 MG-UNIT TABS Take 1 tablet by mouth daily.     Yes Historical Provider,  MD  guaiFENesin (MUCINEX) 600 MG 12 hr tablet Take 1,200 mg by mouth as needed.     Yes Historical Provider, MD  hydrochlorothiazide (HYDRODIURIL) 25 MG tablet Take 1 tablet (25 mg total) by mouth daily. 05/16/11 05/15/12 Yes [name], MD  metoprolol tartrate (LOPRESSOR) 25 MG tablet Take 25 mg by mouth 2 (two) times daily.     Yes Historical Provider, MD  nitroGLYCERIN (NITROSTAT) 0.4 MG SL tablet Place 0.4 mg under the tongue every 5 (five) minutes as needed.     Yes Historical Provider, MD  oxyCODONE-acetaminophen (PERCOCET) 10-325 MG per tablet Take 1 tablet by mouth every 6 (six) hours as needed.     Yes Historical Provider, MD  POTASSIUM GLUCONATE PO Take 1 capsule by mouth daily.     Yes Historical Provider, MD  Probiotic Product (HEALTHY COLON PO) Take by mouth daily.     Yes Historical Provider, MD  ramipril (ALTACE) 10 MG capsule Take 1 capsule (10 mg total) by mouth daily. 06/04/11 06/03/12 Yes [name], MD  [facility] NASAL SPRAY NA) by Nasal route as needed.     Yes Historical Provider, MD    Review of Systems: ROS: GEN: Acute illness details above GI: Tolerating PO intake GU: maintaining adequate hydration and urination Pulm: as above Interactive and getting along well at home.  Otherwise, ROS is as per the HPI.   Physical Examination: Filed Vitals:   09/24/11 1509  BP: 110/60  Pulse:  55  Temp: 98.3 F (36.8 C)   Filed Vitals:   09/24/11 1509  Height: 4\' 10"  (1.473 m)  Weight: 136 lb (61.689 kg)   Body mass index is 28.42 kg/(m^2). Ideal Body Weight: Weight in (lb) to have BMI = 25: 119.4    GEN: A and O x 3. WDWN. NAD.    ENT: Nose clear, ext NML.  No LAD.  No JVD.  TM's clear. Oropharynx clear.  PULM: Normal WOB, no distress. R sided cracklles, o/w diffuse rhonchi CV: RRR, no M/G/R, No rubs, No JVD.   EXT: warm and well-perfused, No c/c/e. PSYCH: Pleasant and conversant.   Assessment and Plan: 1. Pneumonia, bacterial  CBC with Differential,  Basic metabolic panel  2. Cough  DG Chest 2 View, CBC with Differential, Basic metabolic panel  3. COPD exacerbation      Dg Chest 2 View  09/24/2011  *RADIOLOGY REPORT*  Clinical Data: Smoker, question pneumonia  CHEST - 2 VIEW  Comparison: 08/31/2009  Findings: Cardiomediastinal silhouette is stable. Significant elevation of anterior aspect right hemidiaphragm again noted.  No acute infiltrate or pulmonary edema.  Stable degenerative changes thoracic spine.  Spinal stimulation wires are noted mid thoracic spine.  Postsurgical changes lumbar spine are partially visualized. Stable linear scarring right midlung.  Bilateral humeral prosthesis again noted.  IMPRESSION: Significant elevation of anterior aspect right hemidiaphragm again noted.  No acute infiltrate or pulmonary edema.  Stable degenerative changes thoracic spine.  Original Report Authenticated By: Natasha Mead, M.D.   --- on my exam, there is some right-sided crackles that I can hear. Also worried that there may be some infiltrate near the right-sided hemidiaphragm adjacent to the heart.  In this case, we have to assume the patient has developed some pneumonia, and will also treat her for her underlying chronic obstructive pulmonary disease which has worsened compared to my last exam. Prednisone taper and antibiotics.  Hannah Beat, MD

## 2011-09-25 LAB — CBC WITH DIFFERENTIAL/PLATELET
Basophils Relative: 0.4 % (ref 0.0–3.0)
Eosinophils Absolute: 0.2 10*3/uL (ref 0.0–0.7)
HCT: 34.8 % — ABNORMAL LOW (ref 36.0–46.0)
Hemoglobin: 11.5 g/dL — ABNORMAL LOW (ref 12.0–15.0)
Lymphocytes Relative: 29.1 % (ref 12.0–46.0)
Lymphs Abs: 2 10*3/uL (ref 0.7–4.0)
MCHC: 33 g/dL (ref 30.0–36.0)
MCV: 94.7 fl (ref 78.0–100.0)
Neutro Abs: 4.4 10*3/uL (ref 1.4–7.7)
RBC: 3.67 Mil/uL — ABNORMAL LOW (ref 3.87–5.11)

## 2011-09-25 LAB — BASIC METABOLIC PANEL
BUN: 19 mg/dL (ref 6–23)
Calcium: 10 mg/dL (ref 8.4–10.5)
Chloride: 102 mEq/L (ref 96–112)
Creatinine, Ser: 1 mg/dL (ref 0.4–1.2)

## 2011-09-27 ENCOUNTER — Encounter: Payer: Self-pay | Admitting: *Deleted

## 2011-10-03 ENCOUNTER — Ambulatory Visit (INDEPENDENT_AMBULATORY_CARE_PROVIDER_SITE_OTHER): Payer: Medicare Other | Admitting: Family Medicine

## 2011-10-03 ENCOUNTER — Encounter: Payer: Self-pay | Admitting: Family Medicine

## 2011-10-03 VITALS — BP 120/78 | HR 54 | Temp 98.6°F | Ht <= 58 in | Wt 138.0 lb

## 2011-10-03 DIAGNOSIS — N399 Disorder of urinary system, unspecified: Secondary | ICD-10-CM

## 2011-10-03 DIAGNOSIS — N390 Urinary tract infection, site not specified: Secondary | ICD-10-CM

## 2011-10-03 DIAGNOSIS — R3989 Other symptoms and signs involving the genitourinary system: Secondary | ICD-10-CM

## 2011-10-03 LAB — POCT URINALYSIS DIPSTICK
Ketones, UA: NEGATIVE
Protein, UA: NEGATIVE
Spec Grav, UA: 1.02
pH, UA: 6

## 2011-10-03 MED ORDER — SULFAMETHOXAZOLE-TMP DS 800-160 MG PO TABS: 1.0000 | ORAL_TABLET | Freq: Two times a day (BID) | ORAL | Status: AC

## 2011-10-03 NOTE — Patient Instructions (Addendum)
Complete antibiotics.. Both amoxicillin and sulfa. Push fluids.  Call if fever on antibiotic or not improving as expected.

## 2011-10-03 NOTE — Progress Notes (Signed)
  Subjective:    Patient ID: [name], female    DOB: 08-Jun-1931, 78 y.o.   MRN: [id number]  Urinary Tract Infection  This is a new problem. The current episode started in the past 7 days. The problem occurs every urination. The problem has been unchanged. Quality: urinary pressure. The patient is experiencing no pain. There has been no fever. Associated symptoms include frequency and urgency. Pertinent negatives include no chills, flank pain, nausea, sweats or vomiting. Associated symptoms comments: Urine darker in color. She has tried nothing for the symptoms. Her past medical history is significant for recurrent UTIs. There is no history of catheterization, kidney stones or a urological procedure.    On amoxicillin for presumed pneumonia. Cipro allergy but she is not sure why.  Review of Systems  Constitutional: Negative for chills.  Gastrointestinal: Negative for nausea and vomiting.  Genitourinary: Positive for urgency and frequency. Negative for flank pain.       Objective:   Physical Exam  Constitutional: She appears well-developed and well-nourished.       Elderly , in NAD  HENT:  Head: Normocephalic and atraumatic.  Neck: Normal range of motion. Neck supple.  Cardiovascular: Normal rate and regular rhythm.   Pulmonary/Chest: Effort normal and breath sounds normal.       No rhonchi as noted at last exam 6/28  Abdominal: Soft. Bowel sounds are normal. There is no tenderness. There is no CVA tenderness.          Assessment & Plan:

## 2011-10-03 NOTE — Assessment & Plan Note (Signed)
Treat with sulfa as possible allergy to cipro. Push fluids.

## 2011-10-29 ENCOUNTER — Other Ambulatory Visit (INDEPENDENT_AMBULATORY_CARE_PROVIDER_SITE_OTHER): Payer: Medicare Other

## 2011-10-29 DIAGNOSIS — I2510 Atherosclerotic heart disease of native coronary artery without angina pectoris: Secondary | ICD-10-CM

## 2011-10-29 DIAGNOSIS — E785 Hyperlipidemia, unspecified: Secondary | ICD-10-CM

## 2011-10-30 LAB — HEPATIC FUNCTION PANEL
AST: 16 IU/L (ref 0–40)
Albumin: 4.1 g/dL (ref 3.5–4.8)
Alkaline Phosphatase: 38 IU/L — ABNORMAL LOW (ref 45–108)
Bilirubin, Direct: 0.09 mg/dL (ref 0.00–0.40)
Total Bilirubin: 0.3 mg/dL (ref 0.0–1.2)
Total Protein: 6.9 g/dL (ref 6.0–8.5)

## 2011-10-30 LAB — LIPID PANEL
LDL Calculated: 73 mg/dL (ref 0–99)
VLDL Cholesterol Cal: 17 mg/dL (ref 5–40)

## 2011-12-06 ENCOUNTER — Other Ambulatory Visit: Payer: Self-pay | Admitting: Cardiology

## 2011-12-20 ENCOUNTER — Ambulatory Visit (INDEPENDENT_AMBULATORY_CARE_PROVIDER_SITE_OTHER): Payer: Medicare Other

## 2011-12-20 DIAGNOSIS — Z23 Encounter for immunization: Secondary | ICD-10-CM

## 2012-01-07 ENCOUNTER — Telehealth: Payer: Self-pay | Admitting: Cardiology

## 2012-01-07 ENCOUNTER — Encounter: Payer: Self-pay | Admitting: *Deleted

## 2012-01-07 ENCOUNTER — Ambulatory Visit (INDEPENDENT_AMBULATORY_CARE_PROVIDER_SITE_OTHER): Payer: Medicare Other | Admitting: Cardiology

## 2012-01-07 ENCOUNTER — Encounter: Payer: Self-pay | Admitting: Cardiology

## 2012-01-07 VITALS — BP 110/58 | HR 51 | Ht 61.0 in | Wt 137.0 lb

## 2012-01-07 DIAGNOSIS — E785 Hyperlipidemia, unspecified: Secondary | ICD-10-CM

## 2012-01-07 DIAGNOSIS — F17200 Nicotine dependence, unspecified, uncomplicated: Secondary | ICD-10-CM

## 2012-01-07 DIAGNOSIS — R079 Chest pain, unspecified: Secondary | ICD-10-CM

## 2012-01-07 DIAGNOSIS — IMO0001 Reserved for inherently not codable concepts without codable children: Secondary | ICD-10-CM

## 2012-01-07 DIAGNOSIS — I2510 Atherosclerotic heart disease of native coronary artery without angina pectoris: Secondary | ICD-10-CM

## 2012-01-07 DIAGNOSIS — I10 Essential (primary) hypertension: Secondary | ICD-10-CM

## 2012-01-07 LAB — BASIC METABOLIC PANEL
BUN: 37 mg/dL — ABNORMAL HIGH (ref 6–23)
Creatinine, Ser: 1.3 mg/dL — ABNORMAL HIGH (ref 0.4–1.2)
GFR: 40.99 mL/min — ABNORMAL LOW (ref >60.00)
Glucose, Bld: 107 mg/dL — ABNORMAL HIGH (ref 70–99)
Potassium: 3.8 mEq/L (ref 3.5–5.1)

## 2012-01-07 LAB — CBC WITH DIFFERENTIAL/PLATELET
Basophils Absolute: 0 10*3/uL (ref 0.0–0.1)
Eosinophils Relative: 1.6 % (ref 0.0–5.0)
HCT: 35.1 % — ABNORMAL LOW (ref 36.0–46.0)
Lymphs Abs: 2.7 10*3/uL (ref 0.7–4.0)
Monocytes Absolute: 0.7 10*3/uL (ref 0.1–1.0)
Monocytes Relative: 7.7 % (ref 3.0–12.0)
Neutrophils Relative %: 62.1 % (ref 43.0–77.0)
Platelets: 242 10*3/uL (ref 150.0–400.0)
RDW: 13.2 % (ref 11.5–14.6)
WBC: 9.6 10*3/uL (ref 4.5–10.5)

## 2012-01-07 LAB — PROTIME-INR
INR: 1.1 ratio — ABNORMAL HIGH (ref 0.8–1.0)
Prothrombin Time: 11.2 s (ref 10.2–12.4)

## 2012-01-07 MED ORDER — ISOSORBIDE MONONITRATE ER 30 MG PO TB24: 30.0000 mg | ORAL_TABLET | Freq: Every day | ORAL | Status: DC

## 2012-01-07 NOTE — Progress Notes (Signed)
Patient ID: [name], female   DOB: 12/06/1931, 78 y.o.   MRN: [id number] PCP: Dr. Copland  78 yo with history of COPD, HTN, diabetes, and CAD presents for cardiology followup.  Patient was admitted to [facility] with chest pain in 8/12.  Troponin was mildly elevated.  Left heart cath showed severe, diffuse disease in a small RCA that was not amenable to intervention.  This was the likely culprit vessel.  She also had a 70% LAD stenosis.  Lexiscan myoview was then done to assess for ischemia in the LAD distribution.  This showed only a fixed apical perfusion defect with no ischemia (low risk).  She was admitted to [facility] in 1/13 with a lower GI bleed that was thought to be diverticular.  She required blood transfusion.    Until recently, she has been doing well.  She exercises at the [employer] and line dances.  No exertional dyspnea or chest pain with these activities. Unfortunately, she still smokes a few cigarettes a week.  BP is on the low side today, but her home readings have ranged 120s-170s (significant variance).   Patient has had a number of concerning symptoms in the last couple of [place].  She has had 2 episodes of chest pressure at night while lying in bed.  Both were resolved with NTG sublingual.  Yesterday, she was at her son's house sitting out on the deck. She developed heaviness/pressure radiating down both arms.  This was the same symptom she had with her MI in 8/12, but actually more severe.  She took a nitroglycerin with immediate complete pain resolution.  The pain lasted about 5 minutes.  She had another episode of bilateral arm pain this morning while walking to her car.  This was much milder and resolved when she sat down in the car.  She is very worried that she has developed a new blockage.  ECG: NSR, LVH with rate 51  Labs (8/12): LDL 58, HDL 22 Labs (11/12): LDL 54, HDl 32, creatinine 0.8 Labs (4/13): K 3.5, creatinine 1.3 Labs (7/13): K 3.2, creatinine 1.0, LDL 73, HDL  32  PMH: 1. COPD 2. HTN 3. Diabetes mellitus type II 4. ? AAA: Patient says she was told by Dr. Schaller that she has an aneurysm. Abdominal US (10/12) showed no AAA.  5. Chronic low back pain 6. Osteoarthritis: hip pain 7. Anemia  8. Excision of adrenal mass in 2007 9. Restless leg syndrome. 10. Bilateral shoulder replacements.  11. CAD: Cath in 2004 with moderate nonobstructive disease.  Patient was admitted to [facility] in 8/12 with NSTEMI.  LHC (8/12) showed severe diffuse disease of a small RCA.  This was the culprit lesion but it was too small and diffusely diseased for intervention.  There was a 70% proximal LAD stenosis.  Lexiscan myoview (9/12) to assess for LAD territory ischemia showed a small fixed apical defect, low risk.  EF 65% by myoview.  Echo (8/12): EF 60%, mild LV hypertrophy, normal RV size and systolic function, no regional wall motion abnormalities.   12. Diverticular bleed 1/13 requiring microembolization by vascular surgery.  13. ABIs (2/13) within normal limits.   SH: Quit smoking in 9/12 after NSTEMI.  Now restarted.  Lives alone in [place].    FH: Father with MI/CHF, died in his 40s.   ROS: All systems reviewed and negative except as per HPI.    Current Outpatient Prescriptions  Medication Sig Dispense Refill  . ALPRAZolam (XANAX) 0.5 MG tablet Take 1 tablet (  0.5 mg total) by mouth at bedtime as needed.  30 tablet  5  . aspirin EC 81 MG tablet Take 1 tablet (81 mg total) by mouth daily.  150 tablet  2  . atorvastatin (LIPITOR) 40 MG tablet Take 1 tablet (40 mg total) by mouth daily.      . fish oil-omega-3 fatty acids 1000 MG capsule Take 1 g by mouth daily.      . guaiFENesin (MUCINEX) 600 MG 12 hr tablet Take 1,200 mg by mouth as needed.        . hydrochlorothiazide (HYDRODIURIL) 25 MG tablet TAKE ONE TABLET BY MOUTH EVERY DAY  30 tablet  5  . metoprolol tartrate (LOPRESSOR) 25 MG tablet Take 25 mg by mouth 2 (two) times daily.        . Multiple  Vitamins-Minerals (CENTRUM PO) Take 1 tablet by mouth daily.      . nitroGLYCERIN (NITROSTAT) 0.4 MG SL tablet Place 0.4 mg under the tongue every 5 (five) minutes as needed.        . oxyCODONE-acetaminophen (PERCOCET) 10-325 MG per tablet Take 1 tablet by mouth every 6 (six) hours as needed.        . POTASSIUM GLUCONATE PO Take 1 capsule by mouth daily.        . Probiotic Product (HEALTHY COLON PO) Take by mouth daily.        . ramipril (ALTACE) 10 MG capsule Take 1 capsule (10 mg total) by mouth daily.  30 capsule  11  . Saline (OCEAN NASAL SPRAY NA) by Nasal route as needed.        . DISCONTD: metoprolol tartrate (LOPRESSOR) 50 MG tablet Take 1 tablet (50 mg total) by mouth 2 (two) times daily.  60 tablet  11    BP 110/58  Pulse 51  Ht 5' 1" (1.549 m)  Wt 137 lb (62.143 kg)  BMI 25.89 kg/m2 General: elderly, NAD Neck: No JVD, no thyromegaly or thyroid nodule.  Lungs: Prolonged expiratory phase, occasional rhonchi.  CV: Nondisplaced PMI.  Heart regular S1/S2, no S3/S4, no murmur.  No peripheral edema.  No carotid bruit. Unable to palpate pedal pulses.  Abdomen: Soft, nontender, no hepatosplenomegaly, no distention.  Neurologic: Alert and oriented x 3.  Psych: Normal affect. Extremities: No clubbing or cyanosis.   Assessment/Plan:  CAD  Status post NSTEMI in 8/12. Culprit vessel was a small, diffusely diseased RCA. It was not amenable to intervention. Lexiscan myoview in followup showed no ischemia in the LAD distribution (70% proximal LAD stenosis on cath).  She has had worrisome symptoms in the last week or so that are reminiscent of her past MI.  She is very concerned.  Symptoms have had quick resolution with NTG.  I think that the best course in this situation is going to be cardiac cath.  We talked about risks/benefits of the procedure and she agrees to go forward with it.   - LHC this week.  I have asked her to take it easy until then and to come to the ER if she has any prolonged  arm pain/chest pain that does not resolve immediately with NTG.   - Start Imdur 30 mg daily.  - Continue ASA 81, statin, metoprolol, ACEI.  HYPERLIPIDEMIA  LDL near goal (< 70) in 7/13.   HYPERTENSION  BP at goal today but has been up and down on her home readings.  Will continue to monitor, may need to advance BP regimen.  Smoking  I   again strongly encouraged to quit altogether.  She is down to a few cigarettes/week.   Emmilia Sowder 01/07/2012 3:42 PM    

## 2012-01-07 NOTE — Telephone Encounter (Signed)
Per pt call states she has experienced an uncomfortable feeling down both arms like she did with her MI.  This occurred yesterday and was relieved with SL Ntg X 1.  appr 45 mins later while cooking she became lightheaded.  BP was 178/74.  She reports this is a dull feeling down both arms and over left shoulder.  For the past several [place] she has had a heaviness in her heart - not her chest, at night while laying down - that has been relieved with SL NTG.  Reviewed with Dr Shirlee Latch who requested the patient be added on to be seen.  Katina Dung looking for an appt.

## 2012-01-07 NOTE — Patient Instructions (Addendum)
Start Imdur(isosorbide) 30mg  daily.  Your physician recommends that you have lab work today--BMET/CBC/PT/INR  Your physician has requested that you have a cardiac catheterization. Cardiac catheterization is used to diagnose and/or treat various heart conditions. Doctors may recommend this procedure for a number of different reasons. The most common reason is to evaluate chest pain. Chest pain can be a symptom of coronary artery disease (CAD), and cardiac catheterization can show whether plaque is narrowing or blocking your heart's arteries. This procedure is also used to evaluate the valves, as well as measure the blood flow and oxygen levels in different parts of your heart. For further information please visit [email or web address] Please follow instruction sheet, as given. Friday October 11,2013  Your physician recommends that you schedule a follow-up appointment in: about 3 [place] with Dr Shirlee Latch.

## 2012-01-07 NOTE — Telephone Encounter (Signed)
Plz return call to patient [phone] regarding chest discomfort, pt has taken nitro.  Pt concerned blockage or heart attack

## 2012-01-07 NOTE — Telephone Encounter (Signed)
Pt will be evaluated at 3 pm

## 2012-01-08 ENCOUNTER — Other Ambulatory Visit: Payer: Self-pay | Admitting: Cardiology

## 2012-01-09 ENCOUNTER — Other Ambulatory Visit: Payer: Self-pay | Admitting: *Deleted

## 2012-01-09 MED ORDER — AMLODIPINE BESYLATE 5 MG PO TABS: 5.0000 mg | ORAL_TABLET | Freq: Every day | ORAL | Status: DC

## 2012-01-11 ENCOUNTER — Encounter (HOSPITAL_COMMUNITY): Payer: Self-pay | Admitting: Pharmacy Technician

## 2012-01-11 ENCOUNTER — Encounter (HOSPITAL_BASED_OUTPATIENT_CLINIC_OR_DEPARTMENT_OTHER)
Admission: RE | Disposition: A | Discharge status: Home / Self Care | Payer: Self-pay | Source: Ambulatory Visit | Attending: Cardiology

## 2012-01-11 ENCOUNTER — Inpatient Hospital Stay (HOSPITAL_BASED_OUTPATIENT_CLINIC_OR_DEPARTMENT_OTHER)
Admission: RE | Admit: 2012-01-11 | Discharge: 2012-01-11 | Disposition: A | Discharge status: Home / Self Care | Payer: Medicare Other | Source: Ambulatory Visit | Attending: Cardiology | Admitting: Cardiology

## 2012-01-11 ENCOUNTER — Inpatient Hospital Stay (HOSPITAL_BASED_OUTPATIENT_CLINIC_OR_DEPARTMENT_OTHER): Admit: 2012-01-11 | Payer: Self-pay | Admitting: Cardiology

## 2012-01-11 DIAGNOSIS — N189 Chronic kidney disease, unspecified: Secondary | ICD-10-CM | POA: Insufficient documentation

## 2012-01-11 DIAGNOSIS — I200 Unstable angina: Secondary | ICD-10-CM | POA: Insufficient documentation

## 2012-01-11 DIAGNOSIS — E119 Type 2 diabetes mellitus without complications: Secondary | ICD-10-CM | POA: Insufficient documentation

## 2012-01-11 DIAGNOSIS — E785 Hyperlipidemia, unspecified: Secondary | ICD-10-CM | POA: Insufficient documentation

## 2012-01-11 DIAGNOSIS — I2510 Atherosclerotic heart disease of native coronary artery without angina pectoris: Secondary | ICD-10-CM | POA: Insufficient documentation

## 2012-01-11 DIAGNOSIS — J449 Chronic obstructive pulmonary disease, unspecified: Secondary | ICD-10-CM | POA: Insufficient documentation

## 2012-01-11 DIAGNOSIS — J4489 Other specified chronic obstructive pulmonary disease: Secondary | ICD-10-CM | POA: Insufficient documentation

## 2012-01-11 DIAGNOSIS — I129 Hypertensive chronic kidney disease with stage 1 through stage 4 chronic kidney disease, or unspecified chronic kidney disease: Secondary | ICD-10-CM | POA: Insufficient documentation

## 2012-01-11 DIAGNOSIS — F17200 Nicotine dependence, unspecified, uncomplicated: Secondary | ICD-10-CM | POA: Insufficient documentation

## 2012-01-11 LAB — POCT I-STAT GLUCOSE: Glucose, Bld: 91 mg/dL (ref 70–99)

## 2012-01-11 SURGERY — JV LEFT HEART CATHETERIZATION WITH CORONARY ANGIOGRAM
Anesthesia: Moderate Sedation

## 2012-01-11 MED ORDER — SODIUM CHLORIDE 0.9 % IV SOLN: 250.0000 mL | INTRAVENOUS | Status: DC | PRN

## 2012-01-11 MED ORDER — ASPIRIN 81 MG PO CHEW
324.0000 mg | CHEWABLE_TABLET | ORAL | Status: AC
  Administered 2012-01-11: 324 mg via ORAL

## 2012-01-11 MED ORDER — CLOPIDOGREL BISULFATE 300 MG PO TABS
300.0000 mg | ORAL_TABLET | Freq: Once | ORAL | Status: AC
  Administered 2012-01-11: 300 mg via ORAL

## 2012-01-11 MED ORDER — SODIUM CHLORIDE 0.9 % IV SOLN
INTRAVENOUS | Status: DC
  Administered 2012-01-11: 08:00:00 via INTRAVENOUS

## 2012-01-11 MED ORDER — ONDANSETRON HCL 4 MG/2ML IJ SOLN: 4.0000 mg | Freq: Four times a day (QID) | INTRAMUSCULAR | Status: DC | PRN

## 2012-01-11 MED ORDER — SODIUM CHLORIDE 0.9 % IV SOLN: INTRAVENOUS | Status: DC

## 2012-01-11 MED ORDER — SODIUM CHLORIDE 0.9 % IJ SOLN: 3.0000 mL | Freq: Two times a day (BID) | INTRAMUSCULAR | Status: DC

## 2012-01-11 MED ORDER — ACETAMINOPHEN 325 MG PO TABS: 650.0000 mg | ORAL_TABLET | ORAL | Status: DC | PRN

## 2012-01-11 MED ORDER — SODIUM CHLORIDE 0.9 % IJ SOLN: 3.0000 mL | INTRAMUSCULAR | Status: DC | PRN

## 2012-01-11 NOTE — Interval H&P Note (Signed)
History and Physical Interval Note:  01/11/2012 12:13 PM  [name]  has presented today for surgery, with the diagnosis of chest pain  The various methods of treatment have been discussed with the patient and family. After consideration of risks, benefits and other options for treatment, the patient has consented to  Procedure(s) (LRB) with comments: JV LEFT HEART CATHETERIZATION WITH CORONARY ANGIOGRAM (N/A) as a surgical intervention .  The patient's history has been reviewed, patient examined, no change in status, stable for surgery.  I have reviewed the patient's chart and labs.  Questions were answered to the patient's satisfaction.     [name]

## 2012-01-11 NOTE — CV Procedure (Signed)
   Cardiac Catheterization Procedure Note  Name: [name] MRN: [id number] DOB: 07/16/1931  Procedure: Left Heart Cath, Selective Coronary Angiography, LV angiography  Indication: Progressive angina, known CAD.    Procedural details: The right groin was prepped, draped, and anesthetized with 1% lidocaine. Using modified Seldinger technique, a 5 French sheath was introduced into the right femoral artery. Standard Judkins catheters were used for coronary angiography.  No left ventriculography due to CKD. Catheter exchanges were performed over a guidewire. There were no immediate procedural complications. The patient was transferred to the post catheterization recovery area for further monitoring.  Procedural Findings: Hemodynamics:  AO 131/62 LV 142/17   Coronary angiography: Coronary dominance: Left  Left mainstem: 30% ostial left main stenosis.   Left anterior descending (LAD): Up to 90% stenosis in the proximal LAD at the take-off of the first septal perforator.  This area is heavily calcified. There was a large D1 beyond the LAD stenosis with 50% ostial disease.   Left circumflex (LCx):  Dominant vessel with 40% mid-vessel stenosis and 50% distal stenosis.   Right coronary artery (RCA): Diffuse proximal to mid up to 95% stenosis in a small, nondominant RCA.   Left ventriculography: Not done due to CKD.   Final Conclusions:  The disease in the nondominant RCA appears unchanged.  There is an up to 90% proximal LAD stenosis that appears progressive from prior study several years ago.  She has been having progressive anginal symptoms over the last 2 wks.  She has had some relief after I started her on Imdur at last appointment.  Continue Imdur.  Will start Plavix, loading 300 mg today in the cath lab and continuing 75 mg daily at home.  I discussed with Dr. Riley Kill and will plan PCI on Monday.    [name] 01/11/2012, 12:51 PM

## 2012-01-11 NOTE — H&P (View-Only) (Signed)
Patient ID: [name], female   DOB: 1931/09/27, 78 y.o.   MRN: [id number] PCP: Dr. Patsy Lager  78 yo with history of COPD, HTN, diabetes, and CAD presents for cardiology followup.  Patient was admitted to [facility] with chest pain in 8/12.  Troponin was mildly elevated.  Left heart cath showed severe, diffuse disease in a small RCA that was not amenable to intervention.  This was the likely culprit vessel.  She also had a 70% LAD stenosis.  [name] was then done to assess for ischemia in the LAD distribution.  This showed only a fixed apical perfusion defect with no ischemia (low risk).  She was admitted to [facility] in 1/13 with a lower GI bleed that was thought to be diverticular.  She required blood transfusion.    Until recently, she has been doing well.  She exercises at the [facility] and line dances.  No exertional dyspnea or chest pain with these activities. Unfortunately, she still smokes a few cigarettes a week.  BP is on the low side today, but her home readings have ranged 120s-170s (significant variance).   Patient has had a number of concerning symptoms in the last couple of [place].  She has had 2 episodes of chest pressure at night while lying in bed.  Both were resolved with NTG sublingual.  Yesterday, she was at her son's house sitting out on the deck. She developed heaviness/pressure radiating down both arms.  This was the same symptom she had with her MI in 8/12, but actually more severe.  She took a nitroglycerin with immediate complete pain resolution.  The pain lasted about 5 minutes.  She had another episode of bilateral arm pain this morning while walking to her car.  This was much milder and resolved when she sat down in the car.  She is very worried that she has developed a new blockage.  ECG: NSR, LVH with rate 51  Labs (8/12): LDL 58, HDL 22 Labs (11/12): LDL 54, HDl 32, creatinine 0.8 Labs (4/13): K 3.5, creatinine 1.3 Labs (7/13): K 3.2, creatinine 1.0, LDL 73, HDL  32  PMH: 1. COPD 2. HTN 3. Diabetes mellitus type II 4. ? AAA: Patient says she was told by [name] that she has an aneurysm. Abdominal US (10/12) showed no AAA.  5. Chronic low back pain 6. Osteoarthritis: hip pain 7. Anemia  8. Excision of adrenal mass in 2007 9. Restless leg syndrome. 10. Bilateral shoulder replacements.  11. CAD: Cath in 2004 with moderate nonobstructive disease.  Patient was admitted to [facility] in 8/12 with NSTEMI.  LHC (8/12) showed severe diffuse disease of a small RCA.  This was the culprit lesion but it was too small and diffusely diseased for intervention.  There was a 70% proximal LAD stenosis.  Lexiscan myoview (9/12) to assess for LAD territory ischemia showed a small fixed apical defect, low risk.  EF 65% by myoview.  Echo (8/12): EF 60%, mild LV hypertrophy, normal RV size and systolic function, no regional wall motion abnormalities.   12. Diverticular bleed 1/13 requiring microembolization by vascular surgery.  13. ABIs (2/13) within normal limits.   SH: Quit smoking in 9/12 after NSTEMI.  Now restarted.  Lives alone in [place].    FH: Father with MI/CHF, died in his 70s.   ROS: All systems reviewed and negative except as per HPI.    Current Outpatient Prescriptions  Medication Sig Dispense Refill  . ALPRAZolam (XANAX) 0.5 MG tablet Take 1 tablet (  0.5 mg total) by mouth at bedtime as needed.  30 tablet  5  . aspirin EC 81 MG tablet Take 1 tablet (81 mg total) by mouth daily.  150 tablet  2  . atorvastatin (LIPITOR) 40 MG tablet Take 1 tablet (40 mg total) by mouth daily.      . fish oil-omega-3 fatty acids 1000 MG capsule Take 1 g by mouth daily.      [name] guaiFENesin (MUCINEX) 600 MG 12 hr tablet Take 1,200 mg by mouth as needed.        . hydrochlorothiazide (HYDRODIURIL) 25 MG tablet TAKE ONE TABLET BY MOUTH EVERY DAY  30 tablet  5  . metoprolol tartrate (LOPRESSOR) 25 MG tablet Take 25 mg by mouth 2 (two) times daily.        . Multiple  Vitamins-Minerals (CENTRUM PO) Take 1 tablet by mouth daily.      . nitroGLYCERIN (NITROSTAT) 0.4 MG SL tablet Place 0.4 mg under the tongue every 5 (five) minutes as needed.        [name] oxyCODONE-acetaminophen (PERCOCET) 10-325 MG per tablet Take 1 tablet by mouth every 6 (six) hours as needed.        [name] POTASSIUM GLUCONATE PO Take 1 capsule by mouth daily.        . Probiotic Product (HEALTHY COLON PO) Take by mouth daily.        . ramipril (ALTACE) 10 MG capsule Take 1 capsule (10 mg total) by mouth daily.  30 capsule  11  . Saline (OCEAN NASAL SPRAY NA) by Nasal route as needed.        [name] DISCONTD: metoprolol tartrate (LOPRESSOR) 50 MG tablet Take 1 tablet (50 mg total) by mouth 2 (two) times daily.  60 tablet  11    BP 110/58  Pulse 51  Ht 5\' 1"  (1.549 m)  Wt 137 lb (62.143 kg)  BMI 25.89 kg/m2 General: elderly, NAD Neck: No JVD, no thyromegaly or thyroid nodule.  Lungs: Prolonged expiratory phase, occasional rhonchi.  CV: Nondisplaced PMI.  Heart regular S1/S2, no S3/S4, no murmur.  No peripheral edema.  No carotid bruit. Unable to palpate pedal pulses.  Abdomen: Soft, nontender, no hepatosplenomegaly, no distention.  Neurologic: Alert and oriented x 3.  Psych: Normal affect. Extremities: No clubbing or cyanosis.   Assessment/Plan:  CAD  Status post NSTEMI in 8/12. Culprit vessel was a small, diffusely diseased RCA. It was not amenable to intervention. Lexiscan myoview in followup showed no ischemia in the LAD distribution (70% proximal LAD stenosis on cath).  She has had worrisome symptoms in the last week or so that are reminiscent of her past MI.  She is very concerned.  Symptoms have had quick resolution with NTG.  I think that the best course in this situation is going to be cardiac cath.  We talked about risks/benefits of the procedure and she agrees to go forward with it.   - LHC this week.  I have asked her to take it easy until then and to come to the ER if she has any prolonged  arm pain/chest pain that does not resolve immediately with NTG.   - Start Imdur 30 mg daily.  - Continue ASA 81, statin, metoprolol, ACEI.  HYPERLIPIDEMIA  LDL near goal (< 70) in 7/13.   HYPERTENSION  BP at goal today but has been up and down on her home readings.  Will continue to monitor, may need to advance BP regimen.  Smoking  I  again strongly encouraged to quit altogether.  She is down to a few cigarettes/week.   Marca Ancona 01/07/2012 3:42 PM

## 2012-01-13 ENCOUNTER — Other Ambulatory Visit: Payer: Self-pay | Admitting: Cardiology

## 2012-01-13 DIAGNOSIS — I2510 Atherosclerotic heart disease of native coronary artery without angina pectoris: Secondary | ICD-10-CM

## 2012-01-14 ENCOUNTER — Encounter (HOSPITAL_COMMUNITY): Admission: RE | Payer: Self-pay | Source: Ambulatory Visit

## 2012-01-14 ENCOUNTER — Ambulatory Visit (HOSPITAL_COMMUNITY): Admission: RE | Admit: 2012-01-14 | Payer: Medicare Other | Source: Ambulatory Visit | Admitting: Cardiology

## 2012-01-14 SURGERY — PERCUTANEOUS CORONARY STENT INTERVENTION (PCI-S)
Anesthesia: Moderate Sedation

## 2012-01-14 SURGERY — PERCUTANEOUS CORONARY STENT INTERVENTION (PCI-S)

## 2012-01-15 ENCOUNTER — Institutional Professional Consult (permissible substitution) (INDEPENDENT_AMBULATORY_CARE_PROVIDER_SITE_OTHER): Payer: Medicare Other | Admitting: Cardiothoracic Surgery

## 2012-01-15 ENCOUNTER — Other Ambulatory Visit: Payer: Self-pay | Admitting: *Deleted

## 2012-01-15 ENCOUNTER — Encounter: Payer: Self-pay | Admitting: Cardiothoracic Surgery

## 2012-01-15 VITALS — BP 114/60 | HR 57 | Resp 16 | Ht 61.0 in | Wt 132.0 lb

## 2012-01-15 DIAGNOSIS — I728 Aneurysm of other specified arteries: Secondary | ICD-10-CM | POA: Insufficient documentation

## 2012-01-15 DIAGNOSIS — I2510 Atherosclerotic heart disease of native coronary artery without angina pectoris: Secondary | ICD-10-CM

## 2012-01-15 NOTE — Patient Instructions (Addendum)
Stop Plavix Continue Aspirin 81 mg per day Stop Smoking Stop Ramapril (Altace) on Monday 21     Coronary Artery Disease Coronary artery disease (CAD) is a process in which the heart (coronary) arteries narrow or become blocked from the development of atherosclerosis. Atherosclerosis is a disease in which plaque builds up on the inside of the heart arteries (coronary arteries). Plaque is made up of fats (lipids), cholesterol, calcium, and fibrous tissue. CAD can lead to a heart attack (myocardial infarction, MI). An MI can lead to heart failure, cardiogenic shock, or sudden cardiac death. CAD can cause an MI through:  Plaque buildup that can severely narrow or block the coronary arteries and diminish blood flow.  Plaque that can become unstable and "rupture." Unstable plaque that ruptures within a coronary artery can form a clot and cause a sudden (acute) blockage. RISK FACTORS Many risk factors contribute to the development of CAD. These include:  High cholesterol (dyslipidemia) levels.  High blood pressure (hypertension).  Smoking.  Diabetes.  Age.  Gender. Men can develop CAD earlier in life than women.  Family history.  Inactivity or lack of regular physical or aerobic exercise.  A diet high in saturated fats.  Chronic kidney disease. SYMPTOMS  When a coronary artery is narrowed or blocked, an MI can occur. MI symptoms can include:  Chest pain (agina). Angina can occur by itself or it can also occur with pain in the neck, arm, jaw, or in the upper, middle back (mid-scapular pain).  Profuse sweating (diaphoresis) without physical activity or movement.  Shortness of breath (dyspnea).  Irregular heartbeats (palpitations) that feel like your heart is skipping beats or is beating very fast.  Nausea.  Epigastric pain. Epigastric pain may occur as "heartburn."  Tiredness (malaise). This can especially be present in the elderly. Women can have different (atypical)  symptoms other than classic angina.  DIAGNOSIS  The diagnosis of CAD may include:  An electrocardiography (ECG). An ECG does not diagnose CAD, but it is usefull in the detection of a sudden (acute) MI or as a marker for a previous MI. Depending on which heart (coronary) artery may be blocked, an ECG may not pick up an MI pattern.  Exercise stress test. A stress test can be performed at rest for people who are unable to do an exercise stress test. A stress test will only be abnormal if one or more of the large coronary arteries is significantly blocked.  Blood tests. Tests may include samples to detect heart muscle damage (such as troponin levels). Other tests may include cholesterol checks and an inflammation test (high-sensitivity C-reactive protein, hs-CRP).  Coronary angiography.  Screening people who have peripheral vascular disease (PAD). These people often times have CAD. TREATMENT  The treatment of CAD includes the following:  Lifestyle changes such as:  Following a heart-healthy diet. A registered dietitian can you help educate you on healthy food options and changes.  Quiting smoking.  Following an exercise program approved by your caregiver.  Maintaining a healthy weight. Lose weight as approved by your caregiver.  Medicines to help control your blood pressure, cholesterol level, angina, and blood clotting. Medicines may include beta-blockers, ACE inhibitors, statins, nitrates, and anti-platelet medicines.  If you have a heart stent and are taking anti-platelet medicine, it is important to not suddenly stop taking this medicine. Suddenly stopping anti-platelet medicine can result in an MI. Talk with your caregiver before stopping medicine or if you cannot afford your medicine.  If the coronary arteries  are significantly blocked, surgery may be needed. This can include:  Percutaneous coronary intervention (PCI) with or without stent placement.  Coronary artery bypass graft  surgery (CABG). SEEK IMMEDIATE MEDICAL CARE IF:   You develop MI symptoms. This is a medical emergency. Get help at once. Call your local emergency service (911 in the [place] immediately. Do not drive yourself to the clinic or hospital. MI symptoms can include:  Angina or pain that occurs in the neck, arm, jaw, or in the upper middle back.  Profuse sweating without cause.  Shortness of breath or difficulty breathing without cause.  Unexplained nausea or epigastric pain that feels like heartburn. Document Released: 06/11/2011 Document Reviewed: 06/11/2011 [facility] Patient Information 2013 [place], [place].   Coronary Artery Bypass Grafting Coronary artery bypass grafting (CABG) is done to bypass or fix arteries of the heart (coronary) that have become narrow or blocked. This is usually the result of plaque built up in the walls of the vessels. The coronary arteries supply the heart with the oxygen and nutrients it needs to pump blood to your body. The heart never rests and needs constant blood flow. If an artery is partially blocked, you may have chest pain (angina). Lack of blood flow to part of the heart muscle may cause that part to die. This is what happens in a heart attack (myocardial infarction). Reasons for CABG include:  Arteries that cannot be treated with medications or other interventions (such as a heart stent).  Severe angina not responsive to other treatment.  Improving heart function.  Treating a heart attack. Every person is unique. Be sure you understand the risks and benefits of CABG.  RISKS AND COMPLICATIONS Your surgeon will discuss these with you. Your risks will be different depending on your past and present health and other factors. It may be helpful to have a family member or advocate with you so you feel free to ask questions and get the answers you need to give an informed consent. Possible problems of CABG surgery include:  Blood loss and  replacement.  Stroke.  Infection.  Surgical site pain.  Heart attack during or after.  Kidney failure. BEFORE THE PROCEDURE  Tell your doctor about any allergies, medicines, bleeding problems and other surgeries.  Take all medicines exactly as directed. You may start new medicines and stop taking others. Do not stop medications or adjust dosages on your own. Continue taking the medications up until the time of surgery.  Perform only activities that are suggested by your caregiver.  If you are overweight, you should try to lose weight. Eat a heart-healthy diet that is low in fat and salt.  If you smoke, quit.  Let your caregiver know if you have been on steroids (including creams or drops) for long periods of time. This is critical.  If you are diabetic discuss with your doctor whether or not you should take insulin the day of your surgery. DAY OF SURGERY:  Plan to arrive 60 minutes before the scheduled time or as directed.  Do not eat or drink anything, including medicine, unless directed.  You will sign a written consent. You may have blood work and other tests done.  Your family will be shown where they can wait for the surgeon to talk to them when the surgery is over. PROCEDURE Only a specially trained surgeon does a CABG assisted by a team of other health care professionals. You will be asleep and not feel any discomfort during the surgery.  Traditional method:  A cut (incision) is made down the front of the chest through the breastbone (sternum).  The sternum is spread open so the surgeon can see your heart.  You are connected to a machine that does the work of your heart and lungs and your heart stops beating.  Veins are taken from your leg(s) and used to bypass the blocked arteries of your heart. Sometimes an artery from inside your chest wall is used, either by itself or along with leg veins.  When the bypasses are done, you are taken off the machine, and your  heart is restarted and takes over again.  Alternate methods:  The heart lung machine is not used. This is called off-pump. Your heart continues to beat while the bypasses are done.  Smaller incisions are used instead of going through the middle of the chest (minimally invasive).  Intended to reduce pain and promote a faster recovery. AFTER THE PROCEDURE  You may wake up with a tube in your throat to help your breathing. You may be connected to a breathing machine.  You will not be able to talk while the tube is in place. Try not to fight against it. The tube will be taken out as soon as it is safe.  Even if you cannot see them, there are nurses nearby who are watching everything. You are not alone.  Your family should be prepared to see you with many tubes and wires. You will be sleepy and pale. Your family can hold your hand and speak to you, but you may have no memory of this time. SEEK IMMEDIATE MEDICAL CARE IF:  You have severe chest pain, especially if the pain is crushing or pressure-like and spreads to the arms, back, neck, or jaw, or if you have sweating, feel sick to your stomach (nausea), or shortness of breath. THIS IS AN EMERGENCY. Do not wait to see if the pain will go away. Get medical help at once. Call your local emergency services (911 in [place] DO NOT drive yourself to the hospital.  You have an attack of chest pain lasting longer than usual, despite rest and treatment with the medications your doctor has prescribed.  You wake from sleep with chest pain.  You feel dizzy or faint. Document Released: 12/27/2004 Document Revised: 06/11/2011 Document Reviewed: 09/03/2007 [facility] Patient Information 2013 [place], [place].  Coronary Artery Bypass Grafting Care After Refer to this sheet in the next few [place]. These instructions provide you with information on caring for yourself after your procedure. Your caregiver may also give you more specific instructions. Your  treatment has been planned according to current medical practices, but problems sometimes occur. Call your caregiver if you have any problems or questions after your procedure.  Recovery from open heart surgery will be different for everyone. Some people feel well after 3 or 4 [place], while for others it takes longer. After heart surgery, it may be normal to:  Not have an appetite, feel nauseated by the smell of food, or only want to eat a small amount.  Be constipated because of changes in your diet, activity, and medicines. Eat foods high in fiber. Add fresh fruits and vegetables to your diet. Stool softeners may be helpful.  Feel sad or unhappy. You may be frustrated or cranky. You may have good days and bad days. Do not give up. Talk to your caregiver if you do not feel better.  Feel weakness and fatigue. You many need physical therapy or  cardiac rehabilitation to get your strength back.  Develop an irregular heartbeat called atrial fibrillation. Symptoms of atrial fibrillation are a fast, irregular heartbeat or feelings of fluttery heartbeats, shortness of breath, low blood pressure, and dizziness. If these symptoms develop, see your caregiver right away. MEDICATION  Have a list of all the medicines you will be taking when you leave the hospital. For every medicine, know the following:  Name.  Exact dose.  Time of day to be taken.  How often it should be taken.  Why you are taking it.  Ask which medicines should or should not be taken together. If you take more than one heart medicine, ask if it is okay to take them together. Some heart medicines should not be taken at the same time because they may lower your blood pressure too much.  Narcotic pain medicine can cause constipation. Eat fresh fruits and vegetables. Add fiber to your diet. Stool softener medicine may help relieve constipation.  Keep a copy of your medicines with you at all times.  Do not add or stop taking any  medicine until you check with your caregiver.  Medicines can have side effects. Call your caregiver who prescribed the medicine if you:  Start throwing up, have diarrhea, or have stomach pain.  Feel dizzy or lightheaded when you stand up.  Feel your heart is skipping beats or is beating too fast or too slow.  Develop a rash.  Notice unusual bruising or bleeding. HOME CARE INSTRUCTIONS  After heart surgery, it is important to learn how to take your pulse. Have your caregiver show you how to take your pulse.  Use your incentive spirometer. Ask your caregiver how long after surgery you need to use it. Care of your chest incision  Tell your caregiver right away if you notice clicking in your chest (sternum).  Support your chest with a pillow or your arms when you take deep breaths and cough.  Follow your caregiver's instructions about when you can bathe or swim.  Protect your incision from sunlight during the first year to keep the scar from getting dark.  Tell your caregiver if you notice:  Increased tenderness of your incision.  Increased redness or swelling around your incision.  Drainage or pus from your incision. Care of your leg incision(s)  Avoid crossing your legs.  Avoid sitting for long periods of time. Change positions every half hour.  Elevate your leg(s) when you are sitting.  Check your leg(s) daily for swelling. Check the incisions for redness or drainage.  Wear your elastic stockings as told by your caregiver. Take them off at bedtime. Diet  Diet is very important to heart health.  Eat plenty of fresh fruits and vegetables. Meats should be lean cut. Avoid canned, processed, and fried foods.  Talk to a dietician. They can teach you how to make healthy food and drink choices. Weight  Weigh yourself every day. This is important because it helps to know if you are retaining fluid that may make your heart and lungs work harder.  Use the same scale each  time.  Weigh yourself every morning at the same time. You should do this after you go to the bathroom, but before you eat breakfast.  Your weight will be more accurate if you do not wear any clothes.  Record your weight.  Tell your caregiver if you have gained 2 pounds or more overnight. Activity Stop any activity at once if you have chest pain, shortness of  breath, irregular heartbeats, or dizziness. Get help right away if you have any of these symptoms.  Bathing.  Avoid soaking in a bath or hot tub until your incisions are healed.  Rest. You need a balance of rest and activity.  Exercise. Exercise per your caregiver's advice. You may need physical therapy or cardiac rehabilitation to help strengthen your muscles and build your endurance.  Climbing stairs. Unless your caregiver tells you not to climb stairs, go up stairs slowly and rest if you tire. Do not pull yourself up by the handrail.  Driving a car. Follow your caregiver's advice on when you may drive. You may ride as a passenger at any time. When traveling for long periods of time in a car, get out of the car and walk around for a few minutes every 2 hours.  Lifting. Avoid lifting, pushing, or pulling anything heavier than 10 pounds for 6 [place] after surgery or as told by your caregiver.  Returning to work. Check with your caregiver. People heal at different rates. Most people will be able to go back to work 6 to 12 Breit after surgery.  Sexual activity. You may resume sexual relations as told by your caregiver. SEEK MEDICAL CARE IF:  Any of your incisions are red, painful, or have any type of drainage coming from them.  You have an oral temperature above 102 F (38.9 C).  You have ankle or leg swelling.  You have pain in your legs.  You have weight gain of 2 or more pounds a day.  You feel dizzy or lightheaded when you stand up. SEEK IMMEDIATE MEDICAL CARE IF:  You have angina or chest pain that goes to your jaw or  arms. Call your local emergency services right away.  You have shortness of breath at rest or with activity.  You have a fast or irregular heartbeat (arrhythmia).  There is a "clicking" in your sternum when you move.  You have numbness or weakness in your arms or legs. MAKE SURE YOU:  Understand these instructions.  Will watch your condition.  Will get help right away if you are not doing well or get worse. Document Released: 10/06/2004 Document Revised: 06/11/2011 Document Reviewed: 05/24/2010 [facility] Patient Information 2013 [place], [place].

## 2012-01-15 NOTE — Progress Notes (Signed)
[address]            [place] [address]          [phone]      [name] [facility] Medical Record #[id number] Date of Birth: Sep 08, 1931  Referring: Herby Abraham, MD Primary Care: Hannah Beat, MD  Chief Complaint:    Chief Complaint  Patient presents with  . Coronary Artery Disease    eval for CABG..Dr. Shirlee Latch is her cardiologist    History of Present Illness:    Patient is a 78 year old female with known coronary occlusive disease who is referred by Dr. Riley Kill to consider coronary artery bypass grafting. She comes to the office as an outpatient after recent cardiac catheterization. She notes several years of intermittent chest discomfort radiating into her shoulders and with shortness of breath. The discomfort is not clearly exertionally related. She notes an admission to [facility] in August 2012 and was treated medically. She was loaded with Plavix yesterday for cardiac catheterization.      Current Activity/ Functional Status: Patient is independent with mobility/ambulation, transfers, ADL's, IADL's.   Past Medical History  Diagnosis Date  . Hypertension 08/01/1995  . Rheumatoid arthritis 04/03/1983  . Osteoarthritis 04/03/1983  . Anemia, deficiency 07/01/2004    (w/u by Dr. Lorre Nick)  . Diabetes mellitus type II 07/31/1997  . Osteoporosis 12/31/2000  . CAD (coronary artery disease) 09/01/2002    s/p NSTEMI 2012  . Hyperlipemia 07/02/1999  . COPD (chronic obstructive pulmonary disease) 05/31/2004  . diverticulosis of colon   . Anxiety and depression   . Arteriovenous malformation of gastrointestinal tract   . Fatty liver   . Chronic back pain   . History of non-ST elevation myocardial infarction (NSTEMI) 07/22/2011  . Splenic artery aneurysm 1.6 cm in size 2010     Past Surgical History  Procedure Date  . Nasal sinus surgery   . Inguinal hernia repair   . Partial hysterectomy     ovaries intact,  dysmennorhea w/ A/P repari  . Carpal tunnel release     bilat  . Trigger finger release     right  . Wrist surgery     neuroma repair, right  . Ankle surgery     neuroma repair, right ORIF  . Total shoulder replacement 12/13/1998    right, ([place])  . Rotator cuff repair 11/22/2003    ([place]) left  . Epidural block injection 05/2005    x 3  . Long finger tendon realignment 10/31/2005    [place]  . Lumbar epidural 06/13/2006  . Laminectomy 06/2006    L4/5 spinal stenosis (Dr. [name])  . Laminectomy 02/24/2008    L3/4 Ant/Lat interbody fusion and Lat Artthrodesis w/plate using XLIF (Dr. [name])  . Colonic embolization 04/18/2011    Dr. Wyn Quaker, acute GI bleed    Family History  Problem Relation Age of Onset  . Hypotension Mother   . Dementia Mother   . Alcohol abuse Father   . Heart disease Father     MI, enlarged heart  . Diabetes Brother     History   Social History  . Marital Status: Widowed    Spouse Name: N/A    Number of Children: 4  . Years of Education: N/A   Occupational History  . [employer]     9 hour days-6 days/week   Social History Main Topics  .  Smoking status: Current Some Day Smoker -- 62 years    Types: Cigarettes  . Smokeless tobacco: Never Used   Comment: Smokes a few puffs occasionally  . Alcohol Use: Yes     red wine occassionally  . Drug Use: No  . Sexually Active: No   Other Topics Concern  . Not on file   Social History Narrative   Married, widow 1972    History  Smoking status  . Current Some Day Smoker -- 62 years  . Types: Cigarettes  Smokeless tobacco  . Never Used  Comment: Smokes a few puffs occasionally    History  Alcohol Use  . Yes    red wine occassionally     Allergies  Allergen Reactions  . Ciprofloxacin     REACTION: UNSPECIFIED  . Iron Diarrhea  . Morphine Sulfate Nausea And Vomiting    At high doses  . Naproxen Rash    REACTION: UNSPECIFIED    Current Outpatient Prescriptions  Medication Sig Dispense  Refill  . ALPRAZolam (XANAX) 0.5 MG tablet Take 0.5 mg by mouth at bedtime as needed. For sleep      . amLODipine (NORVASC) 5 MG tablet Take 1 tablet (5 mg total) by mouth daily.  30 tablet  6  . aspirin EC 81 MG tablet Take 1 tablet (81 mg total) by mouth daily.  150 tablet  2  . atorvastatin (LIPITOR) 40 MG tablet Take 1 tablet (40 mg total) by mouth daily.      . fish oil-omega-3 fatty acids 1000 MG capsule Take 1 g by mouth daily.      [name] guaiFENesin (MUCINEX) 600 MG 12 hr tablet Take 600 mg by mouth 2 (two) times daily.       . isosorbide mononitrate (IMDUR) 30 MG 24 hr tablet Take 1 tablet (30 mg total) by mouth daily.  30 tablet  6  . metoprolol tartrate (LOPRESSOR) 25 MG tablet Take 25 mg by mouth 2 (two) times daily.        . Multiple Vitamins-Minerals (CENTRUM PO) Take 1 tablet by mouth daily.      . nitroGLYCERIN (NITROSTAT) 0.4 MG SL tablet Place 0.4 mg under the tongue every 5 (five) minutes as needed. For chest pain      . oxyCODONE-acetaminophen (PERCOCET) 10-325 MG per tablet Take 1 tablet by mouth every 6 (six) hours as needed. For pain      . POTASSIUM GLUCONATE PO Take 1 capsule by mouth daily.        . Probiotic Product (HEALTHY COLON PO) Take 1 capsule by mouth daily.       . ramipril (ALTACE) 10 MG capsule Take 1 capsule (10 mg total) by mouth daily.  30 capsule  11  . Saline (OCEAN NASAL SPRAY NA) Place 1-2 sprays into the nose daily as needed. For dry nasal passages      . DISCONTD: metoprolol tartrate (LOPRESSOR) 50 MG tablet Take 1 tablet (50 mg total) by mouth 2 (two) times daily.  60 tablet  11       Review of Systems:     Cardiac Review of Systems: Y or N  Chest Pain [   y ]  Resting SOB [  n ] Exertional SOB  Cove.Etienne  ]  Orthopnea Milo.Brash  ]   Pedal Edema [ nloss  ]    Palpitations Milo.Brash  ] Syncope  Milo.Brash  ]   Presyncope Milo.Brash   ]  General Review of Systems: [  Y] = yes [  ]=no Constitional: recent weight change [loss 20 lbs  ]; anorexia [  ]; fatigue [  ]; nausea [  ]; night  sweats [  ]; fever [  ]; or chills [  ];                                                                                                                                          Dental: poor dentition[ y ]; Last Dentist visit: recent root cannnal  Eye : blurred vision [  ]; diplopia [   ]; vision changes [  ];  Amaurosis fugax[  ]; Resp: cough [  ];  wheezing[  ];  hemoptysis[  ]; shortness of breath[  ]; paroxysmal nocturnal dyspnea[  ]; dyspnea on exertion[  ]; or orthopnea[  ];  GI:  gallstones[ n ], vomiting[n  ];  dysphagia[  ]; melena[y  ];  hematochezia [ y]; heartburn[  ];   Hx of  Colonoscopy[ y ]; GU: kidney stones [  ]; hematuria[  ];   dysuria [  ];  nocturia[  ];  history of     obstruction [  ];             Skin: rash, swelling[  ];, hair loss[  ];  peripheral edema[  ];  or itching[  ]; Musculosketetal: myalgias[  ];  joint swelling[  ];  joint erythema[  ];  joint pain[  ];  back pain[  ];  Heme/Lymph: bruising[  ];  bleeding[  ];  anemia[  ];  Neuro: TIA[  ];  headaches[  ];  stroke[  ];  vertigo[  ];  seizures[  ];   paresthesias[  ];  difficulty walking[  ];  Psych:depression[  ]; anxiety[  ];  Endocrine: diabetes[  ];  thyroid dysfunction[  ];  Immunizations: Flu [ 02/2011 ]; Pneumococcal[ 05/31/2004 ];   Other: history of c diff  5 years ago  Physical Exam: BP 114/60  Pulse 57  Resp 16  Ht 5\' 1"  (1.549 m)  Wt 132 lb (59.875 kg)  BMI 24.94 kg/m2  SpO2 97%  General appearance: alert, cooperative, appears stated age, no distress and mildly obese Neurologic: intact Heart: regular rate and rhythm, S1, S2 normal, no murmur, click, rub or gallop and normal apical impulse Lungs: clear to auscultation bilaterally and normal percussion bilaterally Abdomen: soft, non-tender; bowel sounds normal; no masses,  no organomegaly Extremities: extremities normal, atraumatic, no cyanosis or edema and Homans sign is negative, no sign of DVT Patient has no carotid bruits She has mild  varicosities in the left leg, she notes that she has chronic back pain that bothers her left leg more than the right and prefers pain to be harvested from the right leg  Diagnostic Studies & Laboratory data:     Recent Radiology Findings:  No results found.  Recent Lab Findings: Lab Results  Component Value Date   WBC 9.6 01/07/2012   HGB 11.7* 01/07/2012   HCT 35.1* 01/07/2012   PLT 242.0 01/07/2012   GLUCOSE 91 01/11/2012   CHOL 100 02/08/2011   TRIG 85 10/29/2011   HDL 32* 10/29/2011   LDLCALC 73 10/29/2011   ALT 16 10/29/2011   AST 16 10/29/2011   NA 138 01/07/2012   K 3.8 01/07/2012   CL 102 01/07/2012   CREATININE 1.3* 01/07/2012   BUN 37* 01/07/2012   CO2 28 01/07/2012   TSH 1.28 02/08/2011   INR 1.1* 01/07/2012   HGBA1C 5.9 07/19/2011   Cardiac Catheterization Procedure Note  Name: [name]  MRN: [id number]  DOB: Jul 20, 1931  Procedure: Left Heart Cath, Selective Coronary Angiography, LV angiography  Indication: Progressive angina, known CAD.  Procedural details: The right groin was prepped, draped, and anesthetized with 1% lidocaine. Using modified Seldinger technique, a 5 French sheath was introduced into the right femoral artery. Standard Judkins catheters were used for coronary angiography. No left ventriculography due to CKD. Catheter exchanges were performed over a guidewire. There were no immediate procedural complications. The patient was transferred to the post catheterization recovery area for further monitoring.  Procedural Findings:  Hemodynamics:  AO 131/62  LV 142/17  Coronary angiography:  Coronary dominance: Left  Left mainstem: 30% ostial left main stenosis.  Left anterior descending (LAD): Up to 90% stenosis in the proximal LAD at the take-off of the first septal perforator. This area is heavily calcified. There was a large D1 beyond the LAD stenosis with 50% ostial disease.  Left circumflex (LCx): Dominant vessel with 40% mid-vessel stenosis and 50% distal  stenosis.  Right coronary artery (RCA): Diffuse proximal to mid up to 95% stenosis in a small, nondominant RCA.  Left ventriculography: Not done due to CKD.  Final Conclusions: The disease in the nondominant RCA appears unchanged. There is an up to 90% proximal LAD stenosis that appears progressive from prior study several years ago. She has been having progressive anginal symptoms over the last 2 wks. She has had some relief after I started her on Imdur at last appointment. Continue Imdur. Will start Plavix, loading 300 mg today in the cath lab and continuing 75 mg daily at home. I discussed with Dr. Riley Kill and will plan PCI on Monday.  Marca Ancona  01/11/2012, 12:51 PM   NO ECHO found having been done  Assessment / Plan:      Patient with three-vessel coronary artery disease including high-grade proximal LAD stenosis, 70% distal circumflex total occlusion of the right coronary artery with ongoing anginal symptoms for over a year getting worse lately. The patient had been considered for angioplasty, but now comes to the office to discuss coronary artery bypass grafting after patient had discussion with Dr. Riley Kill.  The risks and options of surgery were discussed with her in detail including the risks of surgery.  She is willing to proceed, to allow for time to wash out the loading dose of Plavix we'll plan to proceed on October 22   Delight Ovens MD  Beeper [phone] Office [phone] 01/15/2012 4:09 PM

## 2012-01-16 ENCOUNTER — Encounter (HOSPITAL_COMMUNITY): Payer: Self-pay | Admitting: Pharmacy Technician

## 2012-01-18 ENCOUNTER — Encounter (HOSPITAL_COMMUNITY)
Admission: RE | Admit: 2012-01-18 | Discharge: 2012-01-18 | Disposition: A | Discharge status: Home / Self Care | Payer: Medicare Other | Source: Ambulatory Visit | Attending: Cardiothoracic Surgery | Admitting: Cardiothoracic Surgery

## 2012-01-18 ENCOUNTER — Ambulatory Visit (HOSPITAL_COMMUNITY)
Admission: RE | Admit: 2012-01-18 | Discharge: 2012-01-18 | Disposition: A | Discharge status: Home / Self Care | Payer: Medicare Other | Source: Ambulatory Visit | Attending: Cardiothoracic Surgery | Admitting: Cardiothoracic Surgery

## 2012-01-18 ENCOUNTER — Encounter (HOSPITAL_COMMUNITY): Payer: Self-pay

## 2012-01-18 VITALS — BP 150/60 | HR 61 | Temp 97.9°F | Resp 20 | Ht 60.0 in | Wt 135.8 lb

## 2012-01-18 DIAGNOSIS — M47814 Spondylosis without myelopathy or radiculopathy, thoracic region: Secondary | ICD-10-CM | POA: Insufficient documentation

## 2012-01-18 DIAGNOSIS — M4000 Postural kyphosis, site unspecified: Secondary | ICD-10-CM | POA: Insufficient documentation

## 2012-01-18 DIAGNOSIS — I2510 Atherosclerotic heart disease of native coronary artery without angina pectoris: Secondary | ICD-10-CM

## 2012-01-18 DIAGNOSIS — R0789 Other chest pain: Secondary | ICD-10-CM | POA: Insufficient documentation

## 2012-01-18 DIAGNOSIS — Z01818 Encounter for other preprocedural examination: Secondary | ICD-10-CM | POA: Insufficient documentation

## 2012-01-18 DIAGNOSIS — Z01810 Encounter for preprocedural cardiovascular examination: Secondary | ICD-10-CM

## 2012-01-18 DIAGNOSIS — Z01812 Encounter for preprocedural laboratory examination: Secondary | ICD-10-CM | POA: Insufficient documentation

## 2012-01-18 LAB — CBC
HCT: 33.3 % — ABNORMAL LOW (ref 36.0–46.0)
Hemoglobin: 11.2 g/dL — ABNORMAL LOW (ref 12.0–15.0)
MCH: 31.1 pg (ref 26.0–34.0)
MCHC: 33.6 g/dL (ref 30.0–36.0)
MCV: 92.5 fL (ref 78.0–100.0)
Platelets: 183 10*3/uL (ref 150–400)
RBC: 3.6 MIL/uL — ABNORMAL LOW (ref 3.87–5.11)
RDW: 13.5 % (ref 11.5–15.5)
WBC: 8.3 10*3/uL (ref 4.0–10.5)

## 2012-01-18 LAB — URINALYSIS, ROUTINE W REFLEX MICROSCOPIC
Bilirubin Urine: NEGATIVE
Glucose, UA: NEGATIVE mg/dL
Hgb urine dipstick: NEGATIVE
Ketones, ur: NEGATIVE mg/dL
Nitrite: NEGATIVE
Protein, ur: NEGATIVE mg/dL
Specific Gravity, Urine: 1.024 (ref 1.005–1.030)
Urobilinogen, UA: 0.2 mg/dL (ref 0.0–1.0)
pH: 5.5 (ref 5.0–8.0)

## 2012-01-18 LAB — BLOOD GAS, ARTERIAL
Acid-base deficit: 1.1 mmol/L (ref 0.0–2.0)
Bicarbonate: 22.4 mEq/L (ref 20.0–24.0)
Drawn by: 181601
FIO2: 0.21 %
O2 Saturation: 97.5 %
Patient temperature: 98.6
TCO2: 23.4 mmol/L (ref 0–100)
pCO2 arterial: 33 mmHg — ABNORMAL LOW (ref 35.0–45.0)
pH, Arterial: 7.447 (ref 7.350–7.450)
pO2, Arterial: 91.2 mmHg (ref 80.0–100.0)

## 2012-01-18 LAB — URINE MICROSCOPIC-ADD ON

## 2012-01-18 LAB — PULMONARY FUNCTION TEST

## 2012-01-18 LAB — COMPREHENSIVE METABOLIC PANEL
ALT: 14 U/L (ref 0–35)
AST: 17 U/L (ref 0–37)
Albumin: 3.8 g/dL (ref 3.5–5.2)
Alkaline Phosphatase: 33 U/L — ABNORMAL LOW (ref 39–117)
BUN: 16 mg/dL (ref 6–23)
CO2: 22 mEq/L (ref 19–32)
Calcium: 9.6 mg/dL (ref 8.4–10.5)
Chloride: 104 mEq/L (ref 96–112)
Creatinine, Ser: 0.69 mg/dL (ref 0.50–1.10)
GFR calc Af Amer: >90 mL/min (ref >90)
GFR calc non Af Amer: 81 mL/min — ABNORMAL LOW (ref >90)
Glucose, Bld: 91 mg/dL (ref 70–99)
Potassium: 3.5 mEq/L (ref 3.5–5.1)
Sodium: 140 mEq/L (ref 135–145)
Total Bilirubin: 0.4 mg/dL (ref 0.3–1.2)
Total Protein: 7.4 g/dL (ref 6.0–8.3)

## 2012-01-18 LAB — SURGICAL PCR SCREEN
MRSA, PCR: NEGATIVE
Staphylococcus aureus: NEGATIVE

## 2012-01-18 LAB — APTT: aPTT: 32 seconds (ref 24–37)

## 2012-01-18 LAB — PROTIME-INR
INR: 1.05 (ref 0.00–1.49)
Prothrombin Time: 13.6 seconds (ref 11.6–15.2)

## 2012-01-18 MED ORDER — ALBUTEROL SULFATE (5 MG/ML) 0.5% IN NEBU
2.5000 mg | INHALATION_SOLUTION | Freq: Once | RESPIRATORY_TRACT | Status: AC
  Administered 2012-01-18: 2.5 mg via RESPIRATORY_TRACT

## 2012-01-18 NOTE — Pre-Procedure Instructions (Signed)
20 [name]  01/18/2012   Your procedure is scheduled on:  01-22-2012  Report to [facility] at 5:30 AM.  Call this number if you have problems the morning of surgery: [phone]   Remember:   Do not eat food:After Midnight.      Take these medicines the morning of surgery with A SIP OF WATER: amlodipine,isosorbide,metoprolol   Do not wear jewelry, make-up or nail polish.  Do not wear lotions, powders, or perfumes. You may wear deodorant.  Do not shave 48 hours prior to surgery. Men may shave face and neck.  Do not bring valuables to the hospital.  Contacts, dentures or bridgework may not be worn into surgery.  Leave suitcase in the car. After surgery it may be brought to your room.   For patients admitted to the hospital, checkout time is 11:00 AM the day of discharge.  [name]   Special Instructions: Incentive Spirometry - Practice and bring it with you on the day of surgery. Shower using CHG 2 nights before surgery and the night before surgery.  If you shower the day of surgery use CHG.  Use special wash - you have one bottle of CHG for all showers.  You should use approximately 1/3 of the bottle for each shower.    Please read over the following fact sheets that you were given: Pain Booklet, Coughing and Deep Breathing, Blood Transfusion Information, Open Heart Packet, MRSA Information and Surgical Site Infection Prevention

## 2012-01-18 NOTE — Progress Notes (Signed)
VASCULAR LAB PRELIMINARY  PRELIMINARY  PRELIMINARY  PRELIMINARY  Pre CABG duplex completed    Preliminary report:  Carotid duplex-moderate to severe calcific plaque disease with 40 to 59 % ICA stenosis bilaterally. Calcific plaque may obscure higher grade stenosis.  Normal ABIs and upper extremity arterial.  Palmar arch signals are normal with radial and ulnar compressions.  [name], 01/18/2012, 8:39 PM

## 2012-01-19 LAB — HEMOGLOBIN A1C
Hgb A1c MFr Bld: 5.7 % — ABNORMAL HIGH (ref <5.7)
Mean Plasma Glucose: 117 mg/dL — ABNORMAL HIGH (ref <117)

## 2012-01-21 MED ORDER — DEXTROSE 5 % IV SOLN
1.5000 g | INTRAVENOUS | Status: AC
  Administered 2012-01-22: 750 mg via INTRAVENOUS
  Administered 2012-01-22: 1500 mg via INTRAVENOUS
  Filled 2012-01-21 (×2): qty 1.5

## 2012-01-21 MED ORDER — PHENYLEPHRINE HCL 10 MG/ML IJ SOLN
30.0000 ug/min | INTRAVENOUS | Status: DC
  Filled 2012-01-21: qty 2

## 2012-01-21 MED ORDER — DEXMEDETOMIDINE HCL IN NACL 400 MCG/100ML IV SOLN
0.1000 ug/kg/h | INTRAVENOUS | Status: DC
  Filled 2012-01-21: qty 100

## 2012-01-21 MED ORDER — MAGNESIUM SULFATE 50 % IJ SOLN
40.0000 meq | INTRAMUSCULAR | Status: DC
  Filled 2012-01-21: qty 10

## 2012-01-21 MED ORDER — EPINEPHRINE HCL 1 MG/ML IJ SOLN
0.5000 ug/min | INTRAVENOUS | Status: DC
  Filled 2012-01-21: qty 4

## 2012-01-21 MED ORDER — SODIUM CHLORIDE 0.9 % IV SOLN
INTRAVENOUS | Status: AC
  Administered 2012-01-22: 69.8 mL/h via INTRAVENOUS
  Filled 2012-01-21: qty 40

## 2012-01-21 MED ORDER — VANCOMYCIN HCL 1000 MG IV SOLR
1250.0000 mg | INTRAVENOUS | Status: AC
  Administered 2012-01-22: 1250 mg via INTRAVENOUS
  Filled 2012-01-21 (×2): qty 1250

## 2012-01-21 MED ORDER — SODIUM CHLORIDE 0.9 % IV SOLN
INTRAVENOUS | Status: DC
  Filled 2012-01-21: qty 1

## 2012-01-21 MED ORDER — POTASSIUM CHLORIDE 2 MEQ/ML IV SOLN
80.0000 meq | INTRAVENOUS | Status: DC
  Filled 2012-01-21: qty 40

## 2012-01-21 MED ORDER — NITROGLYCERIN IN D5W 200-5 MCG/ML-% IV SOLN
2.0000 ug/min | INTRAVENOUS | Status: DC
  Filled 2012-01-21: qty 250

## 2012-01-21 MED ORDER — PLASMA-LYTE 148 IV SOLN
INTRAVENOUS | Status: AC
  Administered 2012-01-22: 10:00:00
  Filled 2012-01-21: qty 2.5

## 2012-01-21 MED ORDER — DOPAMINE-DEXTROSE 3.2-5 MG/ML-% IV SOLN
2.0000 ug/kg/min | INTRAVENOUS | Status: DC
  Filled 2012-01-21: qty 250

## 2012-01-21 MED ORDER — DEXTROSE 5 % IV SOLN
750.0000 mg | INTRAVENOUS | Status: DC
  Filled 2012-01-21: qty 750

## 2012-01-22 ENCOUNTER — Encounter (HOSPITAL_COMMUNITY): Payer: Self-pay | Admitting: *Deleted

## 2012-01-22 ENCOUNTER — Inpatient Hospital Stay (HOSPITAL_COMMUNITY): Payer: Medicare Other | Admitting: Anesthesiology

## 2012-01-22 ENCOUNTER — Encounter (HOSPITAL_COMMUNITY): Payer: Self-pay | Admitting: Anesthesiology

## 2012-01-22 ENCOUNTER — Inpatient Hospital Stay (HOSPITAL_COMMUNITY)
Admission: RE | Admit: 2012-01-22 | Discharge: 2012-01-29 | DRG: 236 | Disposition: A | Discharge status: Skilled Nursing Facility | Payer: Medicare Other | Source: Ambulatory Visit | Attending: Cardiothoracic Surgery | Admitting: Cardiothoracic Surgery

## 2012-01-22 ENCOUNTER — Inpatient Hospital Stay (HOSPITAL_COMMUNITY): Payer: Medicare Other

## 2012-01-22 ENCOUNTER — Encounter (HOSPITAL_COMMUNITY)
Admission: RE | Disposition: A | Discharge status: Skilled Nursing Facility | Payer: Self-pay | Source: Ambulatory Visit | Attending: Cardiothoracic Surgery

## 2012-01-22 DIAGNOSIS — Z794 Long term (current) use of insulin: Secondary | ICD-10-CM

## 2012-01-22 DIAGNOSIS — J9819 Other pulmonary collapse: Secondary | ICD-10-CM | POA: Diagnosis not present

## 2012-01-22 DIAGNOSIS — M549 Dorsalgia, unspecified: Secondary | ICD-10-CM | POA: Diagnosis present

## 2012-01-22 DIAGNOSIS — I252 Old myocardial infarction: Secondary | ICD-10-CM

## 2012-01-22 DIAGNOSIS — E119 Type 2 diabetes mellitus without complications: Secondary | ICD-10-CM

## 2012-01-22 DIAGNOSIS — Z96619 Presence of unspecified artificial shoulder joint: Secondary | ICD-10-CM

## 2012-01-22 DIAGNOSIS — R5381 Other malaise: Secondary | ICD-10-CM | POA: Diagnosis present

## 2012-01-22 DIAGNOSIS — I10 Essential (primary) hypertension: Secondary | ICD-10-CM | POA: Diagnosis present

## 2012-01-22 DIAGNOSIS — Z951 Presence of aortocoronary bypass graft: Secondary | ICD-10-CM

## 2012-01-22 DIAGNOSIS — D62 Acute posthemorrhagic anemia: Secondary | ICD-10-CM | POA: Diagnosis not present

## 2012-01-22 DIAGNOSIS — J449 Chronic obstructive pulmonary disease, unspecified: Secondary | ICD-10-CM | POA: Diagnosis present

## 2012-01-22 DIAGNOSIS — M810 Age-related osteoporosis without current pathological fracture: Secondary | ICD-10-CM | POA: Diagnosis present

## 2012-01-22 DIAGNOSIS — E785 Hyperlipidemia, unspecified: Secondary | ICD-10-CM

## 2012-01-22 DIAGNOSIS — J4489 Other specified chronic obstructive pulmonary disease: Secondary | ICD-10-CM | POA: Diagnosis present

## 2012-01-22 DIAGNOSIS — I2510 Atherosclerotic heart disease of native coronary artery without angina pectoris: Secondary | ICD-10-CM

## 2012-01-22 DIAGNOSIS — D696 Thrombocytopenia, unspecified: Secondary | ICD-10-CM | POA: Diagnosis not present

## 2012-01-22 DIAGNOSIS — I200 Unstable angina: Secondary | ICD-10-CM | POA: Diagnosis present

## 2012-01-22 DIAGNOSIS — Z8249 Family history of ischemic heart disease and other diseases of the circulatory system: Secondary | ICD-10-CM

## 2012-01-22 DIAGNOSIS — F17200 Nicotine dependence, unspecified, uncomplicated: Secondary | ICD-10-CM | POA: Diagnosis present

## 2012-01-22 DIAGNOSIS — M069 Rheumatoid arthritis, unspecified: Secondary | ICD-10-CM | POA: Diagnosis present

## 2012-01-22 HISTORY — PX: CORONARY ARTERY BYPASS GRAFT: SHX141

## 2012-01-22 HISTORY — PX: TEE WITHOUT CARDIOVERSION: SHX5443

## 2012-01-22 LAB — POCT I-STAT 3, ART BLOOD GAS (G3+)
Acid-base deficit: 2 mmol/L (ref 0.0–2.0)
Acid-base deficit: 3 mmol/L — ABNORMAL HIGH (ref 0.0–2.0)
Acid-base deficit: 4 mmol/L — ABNORMAL HIGH (ref 0.0–2.0)
Bicarbonate: 24.7 mEq/L — ABNORMAL HIGH (ref 20.0–24.0)
Bicarbonate: 24.3 mEq/L — ABNORMAL HIGH (ref 20.0–24.0)
Bicarbonate: 22.7 mEq/L (ref 20.0–24.0)
Bicarbonate: 22.3 mEq/L (ref 20.0–24.0)
O2 Saturation: 100 %
O2 Saturation: 97 %
O2 Saturation: 97 %
O2 Saturation: 95 %
Patient temperature: 35.8
Patient temperature: 37.2
Patient temperature: 37.5
TCO2: 26 mmol/L (ref 0–100)
TCO2: 26 mmol/L (ref 0–100)
TCO2: 24 mmol/L (ref 0–100)
TCO2: 24 mmol/L (ref 0–100)
pCO2 arterial: 39.3 mmHg (ref 35.0–45.0)
pCO2 arterial: 44.4 mmHg (ref 35.0–45.0)
pCO2 arterial: 41.5 mmHg (ref 35.0–45.0)
pCO2 arterial: 45.8 mmHg — ABNORMAL HIGH (ref 35.0–45.0)
pH, Arterial: 7.407 (ref 7.350–7.450)
pH, Arterial: 7.34 — ABNORMAL LOW (ref 7.350–7.450)
pH, Arterial: 7.348 — ABNORMAL LOW (ref 7.350–7.450)
pH, Arterial: 7.298 — ABNORMAL LOW (ref 7.350–7.450)
pO2, Arterial: 268 mmHg — ABNORMAL HIGH (ref 80.0–100.0)
pO2, Arterial: 92 mmHg (ref 80.0–100.0)
pO2, Arterial: 92 mmHg (ref 80.0–100.0)
pO2, Arterial: 85 mmHg (ref 80.0–100.0)

## 2012-01-22 LAB — POCT I-STAT 4, (NA,K, GLUC, HGB,HCT)
Glucose, Bld: 102 mg/dL — ABNORMAL HIGH (ref 70–99)
Glucose, Bld: 83 mg/dL (ref 70–99)
Glucose, Bld: 87 mg/dL (ref 70–99)
Glucose, Bld: 109 mg/dL — ABNORMAL HIGH (ref 70–99)
Glucose, Bld: 113 mg/dL — ABNORMAL HIGH (ref 70–99)
Glucose, Bld: 125 mg/dL — ABNORMAL HIGH (ref 70–99)
Glucose, Bld: 132 mg/dL — ABNORMAL HIGH (ref 70–99)
HCT: 29 % — ABNORMAL LOW (ref 36.0–46.0)
HCT: 19 % — ABNORMAL LOW (ref 36.0–46.0)
HCT: 24 % — ABNORMAL LOW (ref 36.0–46.0)
HCT: 22 % — ABNORMAL LOW (ref 36.0–46.0)
HCT: 22 % — ABNORMAL LOW (ref 36.0–46.0)
HCT: 24 % — ABNORMAL LOW (ref 36.0–46.0)
HCT: 32 % — ABNORMAL LOW (ref 36.0–46.0)
Hemoglobin: 9.9 g/dL — ABNORMAL LOW (ref 12.0–15.0)
Hemoglobin: 6.5 g/dL — CL (ref 12.0–15.0)
Hemoglobin: 8.2 g/dL — ABNORMAL LOW (ref 12.0–15.0)
Hemoglobin: 7.5 g/dL — ABNORMAL LOW (ref 12.0–15.0)
Hemoglobin: 7.5 g/dL — ABNORMAL LOW (ref 12.0–15.0)
Hemoglobin: 8.2 g/dL — ABNORMAL LOW (ref 12.0–15.0)
Hemoglobin: 10.9 g/dL — ABNORMAL LOW (ref 12.0–15.0)
Potassium: 3.3 mEq/L — ABNORMAL LOW (ref 3.5–5.1)
Potassium: 3.2 mEq/L — ABNORMAL LOW (ref 3.5–5.1)
Potassium: 3.3 mEq/L — ABNORMAL LOW (ref 3.5–5.1)
Potassium: 4.1 mEq/L (ref 3.5–5.1)
Potassium: 4.3 mEq/L (ref 3.5–5.1)
Potassium: 3.9 mEq/L (ref 3.5–5.1)
Potassium: 3.3 mEq/L — ABNORMAL LOW (ref 3.5–5.1)
Sodium: 142 mEq/L (ref 135–145)
Sodium: 138 mEq/L (ref 135–145)
Sodium: 142 mEq/L (ref 135–145)
Sodium: 135 mEq/L (ref 135–145)
Sodium: 137 mEq/L (ref 135–145)
Sodium: 137 mEq/L (ref 135–145)
Sodium: 140 mEq/L (ref 135–145)

## 2012-01-22 LAB — POCT I-STAT, CHEM 8
BUN: 12 mg/dL (ref 6–23)
Calcium, Ion: 1.13 mmol/L (ref 1.13–1.30)
Chloride: 107 mEq/L (ref 96–112)
Creatinine, Ser: 0.9 mg/dL (ref 0.50–1.10)
Glucose, Bld: 149 mg/dL — ABNORMAL HIGH (ref 70–99)
HCT: 34 % — ABNORMAL LOW (ref 36.0–46.0)
Hemoglobin: 11.6 g/dL — ABNORMAL LOW (ref 12.0–15.0)
Potassium: 4.1 mEq/L (ref 3.5–5.1)
Sodium: 141 mEq/L (ref 135–145)
TCO2: 20 mmol/L (ref 0–100)

## 2012-01-22 LAB — GLUCOSE, CAPILLARY
Glucose-Capillary: 86 mg/dL (ref 70–99)
Glucose-Capillary: 119 mg/dL — ABNORMAL HIGH (ref 70–99)
Glucose-Capillary: 118 mg/dL — ABNORMAL HIGH (ref 70–99)
Glucose-Capillary: 109 mg/dL — ABNORMAL HIGH (ref 70–99)
Glucose-Capillary: 148 mg/dL — ABNORMAL HIGH (ref 70–99)
Glucose-Capillary: 153 mg/dL — ABNORMAL HIGH (ref 70–99)

## 2012-01-22 LAB — PROTIME-INR
INR: 1.52 — ABNORMAL HIGH (ref 0.00–1.49)
Prothrombin Time: 17.9 seconds — ABNORMAL HIGH (ref 11.6–15.2)

## 2012-01-22 LAB — CBC
HCT: 32.6 % — ABNORMAL LOW (ref 36.0–46.0)
HCT: 34.4 % — ABNORMAL LOW (ref 36.0–46.0)
Hemoglobin: 11.2 g/dL — ABNORMAL LOW (ref 12.0–15.0)
Hemoglobin: 11.8 g/dL — ABNORMAL LOW (ref 12.0–15.0)
MCH: 31.1 pg (ref 26.0–34.0)
MCHC: 34.4 g/dL (ref 30.0–36.0)
MCHC: 34.3 g/dL (ref 30.0–36.0)
MCV: 90.8 fL (ref 78.0–100.0)
Platelets: 95 10*3/uL — ABNORMAL LOW (ref 150–400)
RBC: 3.79 MIL/uL — ABNORMAL LOW (ref 3.87–5.11)
RDW: 14 % (ref 11.5–15.5)
RDW: 14.5 % (ref 11.5–15.5)
WBC: 8 10*3/uL (ref 4.0–10.5)
WBC: 11 10*3/uL — ABNORMAL HIGH (ref 4.0–10.5)

## 2012-01-22 LAB — POCT I-STAT GLUCOSE
Glucose, Bld: 89 mg/dL (ref 70–99)
Operator id: 195151

## 2012-01-22 LAB — PLATELET COUNT: Platelets: 102 10*3/uL — ABNORMAL LOW (ref 150–400)

## 2012-01-22 LAB — CREATININE, SERUM
Creatinine, Ser: 0.68 mg/dL (ref 0.50–1.10)
GFR calc Af Amer: >90 mL/min (ref >90)
GFR calc non Af Amer: 82 mL/min — ABNORMAL LOW (ref >90)

## 2012-01-22 LAB — HEMOGLOBIN AND HEMATOCRIT, BLOOD
HCT: 22.7 % — ABNORMAL LOW (ref 36.0–46.0)
Hemoglobin: 8 g/dL — ABNORMAL LOW (ref 12.0–15.0)

## 2012-01-22 LAB — MAGNESIUM: Magnesium: 3.1 mg/dL — ABNORMAL HIGH (ref 1.5–2.5)

## 2012-01-22 LAB — APTT: aPTT: 33 seconds (ref 24–37)

## 2012-01-22 SURGERY — CORONARY ARTERY BYPASS GRAFTING (CABG)
Anesthesia: General | Site: Esophagus | Wound class: Clean

## 2012-01-22 MED ORDER — ATORVASTATIN CALCIUM 40 MG PO TABS
40.0000 mg | ORAL_TABLET | Freq: Every day | ORAL | Status: DC
  Administered 2012-01-23 – 2012-01-29 (×7): 40 mg via ORAL
  Filled 2012-01-22 (×8): qty 1

## 2012-01-22 MED ORDER — INSULIN ASPART 100 UNIT/ML ~~LOC~~ SOLN
0.0000 [IU] | SUBCUTANEOUS | Status: AC
  Administered 2012-01-22 (×2): 2 [IU] via SUBCUTANEOUS

## 2012-01-22 MED ORDER — HEPARIN SODIUM (PORCINE) 1000 UNIT/ML IJ SOLN
INTRAMUSCULAR | Status: DC | PRN
  Administered 2012-01-22: 16 mL via INTRAVENOUS

## 2012-01-22 MED ORDER — INSULIN REGULAR BOLUS VIA INFUSION
0.0000 [IU] | Freq: Three times a day (TID) | INTRAVENOUS | Status: DC
  Filled 2012-01-22: qty 10

## 2012-01-22 MED ORDER — PROPOFOL 10 MG/ML IV BOLUS
INTRAVENOUS | Status: DC | PRN
  Administered 2012-01-22: 40 mg via INTRAVENOUS

## 2012-01-22 MED ORDER — PROTAMINE SULFATE 10 MG/ML IV SOLN
INTRAVENOUS | Status: DC | PRN
  Administered 2012-01-22 (×2): 20 mg via INTRAVENOUS
  Administered 2012-01-22 (×4): 30 mg via INTRAVENOUS

## 2012-01-22 MED ORDER — LACTATED RINGERS IV SOLN
INTRAVENOUS | Status: DC | PRN
  Administered 2012-01-22: 07:00:00 via INTRAVENOUS

## 2012-01-22 MED ORDER — FAMOTIDINE IN NACL 20-0.9 MG/50ML-% IV SOLN
20.0000 mg | Freq: Two times a day (BID) | INTRAVENOUS | Status: AC
  Administered 2012-01-22: 20 mg via INTRAVENOUS
  Filled 2012-01-22: qty 50

## 2012-01-22 MED ORDER — MORPHINE SULFATE 2 MG/ML IJ SOLN
2.0000 mg | INTRAMUSCULAR | Status: DC | PRN
  Administered 2012-01-22 – 2012-01-23 (×6): 2 mg via INTRAVENOUS
  Administered 2012-01-23: 4 mg via INTRAVENOUS
  Administered 2012-01-23 (×2): 2 mg via INTRAVENOUS
  Administered 2012-01-23 – 2012-01-24 (×2): 4 mg via INTRAVENOUS
  Administered 2012-01-24: 2 mg via INTRAVENOUS
  Administered 2012-01-24 (×2): 4 mg via INTRAVENOUS
  Administered 2012-01-26: 2 mg via INTRAVENOUS
  Filled 2012-01-22: qty 1
  Filled 2012-01-22 (×3): qty 2
  Filled 2012-01-22 (×3): qty 1
  Filled 2012-01-22 (×2): qty 2
  Filled 2012-01-22 (×7): qty 1

## 2012-01-22 MED ORDER — TRANEXAMIC ACID 100 MG/ML IV SOLN: 2500.0000 mg | INTRAVENOUS | Status: DC | PRN

## 2012-01-22 MED ORDER — NITROGLYCERIN IN D5W 200-5 MCG/ML-% IV SOLN
INTRAVENOUS | Status: DC | PRN
  Administered 2012-01-22: 5 ug/min via INTRAVENOUS

## 2012-01-22 MED ORDER — SODIUM CHLORIDE 0.45 % IV SOLN
INTRAVENOUS | Status: DC
  Administered 2012-01-23: 20 mL/h via INTRAVENOUS

## 2012-01-22 MED ORDER — ACETAMINOPHEN 10 MG/ML IV SOLN
1000.0000 mg | Freq: Once | INTRAVENOUS | Status: AC
  Administered 2012-01-22: 1000 mg via INTRAVENOUS
  Filled 2012-01-22: qty 100

## 2012-01-22 MED ORDER — SODIUM CHLORIDE 0.9 % IV SOLN
INTRAVENOUS | Status: DC | PRN
  Administered 2012-01-22: 12:00:00 via INTRAVENOUS

## 2012-01-22 MED ORDER — 0.9 % SODIUM CHLORIDE (POUR BTL) OPTIME
TOPICAL | Status: DC | PRN
  Administered 2012-01-22: 6000 mL

## 2012-01-22 MED ORDER — SODIUM CHLORIDE 0.9 % IJ SOLN
OROMUCOSAL | Status: DC | PRN
  Administered 2012-01-22 (×3): via TOPICAL

## 2012-01-22 MED ORDER — LACTATED RINGERS IV SOLN
INTRAVENOUS | Status: DC | PRN
  Administered 2012-01-22 (×2): via INTRAVENOUS

## 2012-01-22 MED ORDER — SODIUM CHLORIDE 0.9 % IV SOLN: 250.0000 mL | INTRAVENOUS | Status: DC

## 2012-01-22 MED ORDER — INSULIN ASPART 100 UNIT/ML ~~LOC~~ SOLN
0.0000 [IU] | SUBCUTANEOUS | Status: DC
  Administered 2012-01-23 (×3): 2 [IU] via SUBCUTANEOUS
  Administered 2012-01-24 (×2): 4 [IU] via SUBCUTANEOUS
  Administered 2012-01-24: 8 [IU] via SUBCUTANEOUS

## 2012-01-22 MED ORDER — DOCUSATE SODIUM 100 MG PO CAPS
200.0000 mg | ORAL_CAPSULE | Freq: Every day | ORAL | Status: DC
  Administered 2012-01-23 – 2012-01-26 (×4): 200 mg via ORAL
  Filled 2012-01-22 (×4): qty 2

## 2012-01-22 MED ORDER — SODIUM CHLORIDE 0.9 % IV SOLN
200.0000 ug | INTRAVENOUS | Status: DC | PRN
  Administered 2012-01-22: 0.2 ug/kg/h via INTRAVENOUS

## 2012-01-22 MED ORDER — VECURONIUM BROMIDE 10 MG IV SOLR
INTRAVENOUS | Status: DC | PRN
  Administered 2012-01-22: 4 mg via INTRAVENOUS
  Administered 2012-01-22 (×2): 5 mg via INTRAVENOUS

## 2012-01-22 MED ORDER — ROCURONIUM BROMIDE 100 MG/10ML IV SOLN
INTRAVENOUS | Status: DC | PRN
  Administered 2012-01-22 (×2): 50 mg via INTRAVENOUS

## 2012-01-22 MED ORDER — BISACODYL 10 MG RE SUPP: 10.0000 mg | Freq: Every day | RECTAL | Status: DC

## 2012-01-22 MED ORDER — SODIUM CHLORIDE 0.9 % IV SOLN
100.0000 [IU] | INTRAVENOUS | Status: DC | PRN
  Administered 2012-01-22: .6 [IU]/h via INTRAVENOUS
  Administered 2012-01-22: 1.3 [IU]/h via INTRAVENOUS

## 2012-01-22 MED ORDER — LEVALBUTEROL HCL 0.63 MG/3ML IN NEBU
0.6300 mg | INHALATION_SOLUTION | Freq: Four times a day (QID) | RESPIRATORY_TRACT | Status: DC | PRN
  Administered 2012-01-24 – 2012-01-25 (×2): 0.63 mg via RESPIRATORY_TRACT
  Filled 2012-01-22: qty 3

## 2012-01-22 MED ORDER — NITROGLYCERIN IN D5W 200-5 MCG/ML-% IV SOLN
0.0000 ug/min | INTRAVENOUS | Status: DC
  Filled 2012-01-22: qty 250

## 2012-01-22 MED ORDER — MAGNESIUM SULFATE 40 MG/ML IJ SOLN
4.0000 g | Freq: Once | INTRAMUSCULAR | Status: AC
  Administered 2012-01-22: 4 g via INTRAVENOUS
  Filled 2012-01-22: qty 100

## 2012-01-22 MED ORDER — MIDAZOLAM HCL 2 MG/2ML IJ SOLN: 2.0000 mg | INTRAMUSCULAR | Status: DC | PRN

## 2012-01-22 MED ORDER — OXYCODONE HCL 5 MG PO TABS
5.0000 mg | ORAL_TABLET | ORAL | Status: DC | PRN
  Administered 2012-01-23 – 2012-01-26 (×14): 10 mg via ORAL
  Filled 2012-01-22 (×15): qty 2

## 2012-01-22 MED ORDER — DEXMEDETOMIDINE HCL IN NACL 200 MCG/50ML IV SOLN
0.1000 ug/kg/h | INTRAVENOUS | Status: DC
  Filled 2012-01-22: qty 50

## 2012-01-22 MED ORDER — POTASSIUM CHLORIDE 10 MEQ/50ML IV SOLN
10.0000 meq | INTRAVENOUS | Status: AC
  Administered 2012-01-22 (×3): 10 meq via INTRAVENOUS

## 2012-01-22 MED ORDER — METOPROLOL TARTRATE 12.5 MG HALF TABLET: 12.5000 mg | ORAL_TABLET | Freq: Once | ORAL | Status: DC

## 2012-01-22 MED ORDER — SODIUM CHLORIDE 0.9 % IV SOLN
INTRAVENOUS | Status: AC
  Filled 2012-01-22: qty 1

## 2012-01-22 MED ORDER — ALBUMIN HUMAN 5 % IV SOLN
INTRAVENOUS | Status: DC | PRN
  Administered 2012-01-22 (×2): via INTRAVENOUS

## 2012-01-22 MED ORDER — MORPHINE SULFATE 2 MG/ML IJ SOLN
1.0000 mg | INTRAMUSCULAR | Status: AC | PRN
  Administered 2012-01-22: 2 mg via INTRAVENOUS
  Filled 2012-01-22: qty 1

## 2012-01-22 MED ORDER — SODIUM CHLORIDE 0.9 % IJ SOLN: 3.0000 mL | INTRAMUSCULAR | Status: DC | PRN

## 2012-01-22 MED ORDER — ONDANSETRON HCL 4 MG/2ML IJ SOLN
4.0000 mg | Freq: Four times a day (QID) | INTRAMUSCULAR | Status: DC | PRN
  Administered 2012-01-24: 4 mg via INTRAVENOUS
  Filled 2012-01-22: qty 2

## 2012-01-22 MED ORDER — ASPIRIN EC 325 MG PO TBEC
325.0000 mg | DELAYED_RELEASE_TABLET | Freq: Every day | ORAL | Status: DC
  Administered 2012-01-23 – 2012-01-26 (×4): 325 mg via ORAL
  Filled 2012-01-22 (×4): qty 1

## 2012-01-22 MED ORDER — HEMOSTATIC AGENTS (NO CHARGE) OPTIME
TOPICAL | Status: DC | PRN
  Administered 2012-01-22: 1 via TOPICAL

## 2012-01-22 MED ORDER — CHLORHEXIDINE GLUCONATE 4 % EX LIQD
30.0000 mL | CUTANEOUS | Status: DC
Start: 2012-01-22 — End: 2012-01-22

## 2012-01-22 MED ORDER — FENTANYL CITRATE 0.05 MG/ML IJ SOLN
INTRAMUSCULAR | Status: DC | PRN
  Administered 2012-01-22 (×2): 100 ug via INTRAVENOUS
  Administered 2012-01-22: 50 ug via INTRAVENOUS
  Administered 2012-01-22 (×3): 100 ug via INTRAVENOUS
  Administered 2012-01-22: 250 ug via INTRAVENOUS
  Administered 2012-01-22: 100 ug via INTRAVENOUS
  Administered 2012-01-22: 250 ug via INTRAVENOUS
  Administered 2012-01-22: 50 ug via INTRAVENOUS
  Administered 2012-01-22: 100 ug via INTRAVENOUS
  Administered 2012-01-22: 50 ug via INTRAVENOUS
  Administered 2012-01-22: 150 ug via INTRAVENOUS

## 2012-01-22 MED ORDER — PANTOPRAZOLE SODIUM 40 MG PO TBEC
40.0000 mg | DELAYED_RELEASE_TABLET | Freq: Every day | ORAL | Status: DC
  Administered 2012-01-24 – 2012-01-26 (×3): 40 mg via ORAL
  Filled 2012-01-22 (×3): qty 1

## 2012-01-22 MED ORDER — DEXTROSE 5 % IV SOLN
1.5000 g | Freq: Two times a day (BID) | INTRAVENOUS | Status: AC
  Administered 2012-01-22 – 2012-01-24 (×4): 1.5 g via INTRAVENOUS
  Filled 2012-01-22 (×4): qty 1.5

## 2012-01-22 MED ORDER — LACTATED RINGERS IV SOLN: 500.0000 mL | Freq: Once | INTRAVENOUS | Status: AC | PRN

## 2012-01-22 MED ORDER — SODIUM CHLORIDE 0.9 % IV SOLN: INTRAVENOUS | Status: DC

## 2012-01-22 MED ORDER — BISACODYL 5 MG PO TBEC
10.0000 mg | DELAYED_RELEASE_TABLET | Freq: Every day | ORAL | Status: DC
  Administered 2012-01-23 – 2012-01-26 (×4): 10 mg via ORAL
  Filled 2012-01-22 (×4): qty 2

## 2012-01-22 MED ORDER — PHENYLEPHRINE HCL 10 MG/ML IJ SOLN
0.0000 ug/min | INTRAVENOUS | Status: DC
  Filled 2012-01-22: qty 2

## 2012-01-22 MED ORDER — VANCOMYCIN HCL IN DEXTROSE 1-5 GM/200ML-% IV SOLN
1000.0000 mg | Freq: Once | INTRAVENOUS | Status: AC
  Administered 2012-01-22: 1000 mg via INTRAVENOUS
  Filled 2012-01-22: qty 200

## 2012-01-22 MED ORDER — LACTATED RINGERS IV SOLN: INTRAVENOUS | Status: DC

## 2012-01-22 MED ORDER — LEVALBUTEROL HCL 0.63 MG/3ML IN NEBU
0.6300 mg | INHALATION_SOLUTION | Freq: Four times a day (QID) | RESPIRATORY_TRACT | Status: DC
  Administered 2012-01-22 – 2012-01-29 (×25): 0.63 mg via RESPIRATORY_TRACT
  Filled 2012-01-22 (×33): qty 3

## 2012-01-22 MED ORDER — ACETAMINOPHEN 160 MG/5ML PO SOLN: 975.0000 mg | Freq: Four times a day (QID) | ORAL | Status: DC

## 2012-01-22 MED ORDER — ALBUMIN HUMAN 5 % IV SOLN
250.0000 mL | INTRAVENOUS | Status: AC | PRN
  Administered 2012-01-22: 250 mL via INTRAVENOUS

## 2012-01-22 MED ORDER — ASPIRIN 81 MG PO CHEW: 324.0000 mg | CHEWABLE_TABLET | Freq: Every day | ORAL | Status: DC

## 2012-01-22 MED ORDER — ACETAMINOPHEN 500 MG PO TABS
1000.0000 mg | ORAL_TABLET | Freq: Four times a day (QID) | ORAL | Status: DC
  Administered 2012-01-22 – 2012-01-26 (×14): 1000 mg via ORAL
  Filled 2012-01-22 (×18): qty 2

## 2012-01-22 MED ORDER — PHENYLEPHRINE HCL 10 MG/ML IJ SOLN
10.0000 mg | INTRAVENOUS | Status: DC | PRN
  Administered 2012-01-22: 20 ug/min via INTRAVENOUS

## 2012-01-22 MED ORDER — METOPROLOL TARTRATE 25 MG/10 ML ORAL SUSPENSION
12.5000 mg | Freq: Two times a day (BID) | ORAL | Status: DC
  Administered 2012-01-23: 12.5 mg
  Filled 2012-01-22 (×9): qty 5

## 2012-01-22 MED ORDER — SODIUM CHLORIDE 0.9 % IJ SOLN
3.0000 mL | Freq: Two times a day (BID) | INTRAMUSCULAR | Status: DC
  Administered 2012-01-24 – 2012-01-26 (×4): 3 mL via INTRAVENOUS

## 2012-01-22 MED ORDER — MIDAZOLAM HCL 5 MG/5ML IJ SOLN
INTRAMUSCULAR | Status: DC | PRN
  Administered 2012-01-22: 1 mg via INTRAVENOUS
  Administered 2012-01-22: 5 mg via INTRAVENOUS
  Administered 2012-01-22: 3 mg via INTRAVENOUS
  Administered 2012-01-22: 1 mg via INTRAVENOUS

## 2012-01-22 MED ORDER — METOPROLOL TARTRATE 1 MG/ML IV SOLN: 2.5000 mg | INTRAVENOUS | Status: DC | PRN

## 2012-01-22 MED ORDER — METOPROLOL TARTRATE 12.5 MG HALF TABLET
12.5000 mg | ORAL_TABLET | Freq: Two times a day (BID) | ORAL | Status: DC
  Administered 2012-01-23 – 2012-01-26 (×5): 12.5 mg via ORAL
  Filled 2012-01-22 (×9): qty 1

## 2012-01-22 SURGICAL SUPPLY — 111 items
APL SKNCLS STERI-STRIP NONHPOA (GAUZE/BANDAGES/DRESSINGS) ×4
ATTRACTOMAT 16X20 MAGNETIC DRP (DRAPES) ×3 IMPLANT
BAG DECANTER FOR FLEXI CONT (MISCELLANEOUS) ×3 IMPLANT
BANDAGE ELASTIC 4 VELCRO ST LF (GAUZE/BANDAGES/DRESSINGS) ×2 IMPLANT
BANDAGE ELASTIC 6 VELCRO ST LF (GAUZE/BANDAGES/DRESSINGS) ×2 IMPLANT
BANDAGE GAUZE ELAST BULKY 4 IN (GAUZE/BANDAGES/DRESSINGS) ×2 IMPLANT
BENZOIN TINCTURE PRP APPL 2/3 (GAUZE/BANDAGES/DRESSINGS) ×2 IMPLANT
BLADE STERNUM SYSTEM 6 (BLADE) ×3 IMPLANT
BLADE SURG 11 STRL SS (BLADE) ×1 IMPLANT
BLADE SURG ROTATE 9660 (MISCELLANEOUS) IMPLANT
CANISTER SUCTION 2500CC (MISCELLANEOUS) ×3 IMPLANT
CANN PRFSN .5XCNCT 15X34-48 (MISCELLANEOUS) ×2
CANNULA AORTIC HI-FLOW 6.5M20F (CANNULA) ×3 IMPLANT
CANNULA PRFSN .5XCNCT 15X34-48 (MISCELLANEOUS) ×2 IMPLANT
CANNULA VEN 2 STAGE (MISCELLANEOUS) ×4 IMPLANT
CATH CPB KIT GERHARDT (MISCELLANEOUS) ×3 IMPLANT
CATH THORACIC 28FR (CATHETERS) ×3 IMPLANT
CATH THORACIC 36FR (CATHETERS) IMPLANT
CATH THORACIC 36FR RT ANG (CATHETERS) IMPLANT
CLIP TI MEDIUM 24 (CLIP) IMPLANT
CLIP TI WIDE RED SMALL 24 (CLIP) IMPLANT
CLOTH BEACON ORANGE TIMEOUT ST (SAFETY) ×3 IMPLANT
COVER SURGICAL LIGHT HANDLE (MISCELLANEOUS) ×3 IMPLANT
CRADLE DONUT ADULT HEAD (MISCELLANEOUS) ×3 IMPLANT
DRAIN CHANNEL 28F RND 3/8 FF (WOUND CARE) ×3 IMPLANT
DRAPE CARDIOVASCULAR INCISE (DRAPES) ×3
DRAPE SLUSH/WARMER DISC (DRAPES) ×3 IMPLANT
DRAPE SRG 135X102X78XABS (DRAPES) ×2 IMPLANT
DRSG COVADERM 4X14 (GAUZE/BANDAGES/DRESSINGS) ×1 IMPLANT
ELECT BLADE 4.0 EZ CLEAN MEGAD (MISCELLANEOUS) ×6
ELECT CAUTERY BLADE 6.4 (BLADE) ×3 IMPLANT
ELECT REM PT RETURN 9FT ADLT (ELECTROSURGICAL) ×6
ELECTRODE BLDE 4.0 EZ CLN MEGD (MISCELLANEOUS) ×2 IMPLANT
ELECTRODE REM PT RTRN 9FT ADLT (ELECTROSURGICAL) ×4 IMPLANT
GLOVE BIO SURGEON STRL SZ 6 (GLOVE) ×8 IMPLANT
GLOVE BIO SURGEON STRL SZ 6.5 (GLOVE) ×11 IMPLANT
GLOVE BIO SURGEON STRL SZ7 (GLOVE) IMPLANT
GLOVE BIO SURGEON STRL SZ7.5 (GLOVE) IMPLANT
GLOVE BIOGEL PI IND STRL 6 (GLOVE) IMPLANT
GLOVE BIOGEL PI IND STRL 6.5 (GLOVE) IMPLANT
GLOVE BIOGEL PI IND STRL 7.0 (GLOVE) IMPLANT
GLOVE BIOGEL PI INDICATOR 6 (GLOVE) ×2
GLOVE BIOGEL PI INDICATOR 6.5 (GLOVE) ×2
GLOVE BIOGEL PI INDICATOR 7.0 (GLOVE) ×5
GOWN STRL NON-REIN LRG LVL3 (GOWN DISPOSABLE) ×17 IMPLANT
HEMOSTAT POWDER SURGIFOAM 1G (HEMOSTASIS) ×9 IMPLANT
HEMOSTAT SURGICEL 2X14 (HEMOSTASIS) ×3 IMPLANT
INSERT FOGARTY 61MM (MISCELLANEOUS) IMPLANT
INSERT FOGARTY XLG (MISCELLANEOUS) IMPLANT
KIT BASIN OR (CUSTOM PROCEDURE TRAY) ×3 IMPLANT
KIT ROOM TURNOVER OR (KITS) ×3 IMPLANT
KIT SUCTION CATH 14FR (SUCTIONS) ×6 IMPLANT
KIT VASOVIEW W/TROCAR VH 2000 (KITS) ×3 IMPLANT
LEAD PACING MYOCARDI (MISCELLANEOUS) ×3 IMPLANT
MARKER GRAFT CORONARY BYPASS (MISCELLANEOUS) ×9 IMPLANT
NS IRRIG 1000ML POUR BTL (IV SOLUTION) ×16 IMPLANT
PACK OPEN HEART (CUSTOM PROCEDURE TRAY) ×3 IMPLANT
PAD ARMBOARD 7.5X6 YLW CONV (MISCELLANEOUS) ×6 IMPLANT
PENCIL BUTTON HOLSTER BLD 10FT (ELECTRODE) ×4 IMPLANT
PUNCH AORTIC ROTATE 4.0MM (MISCELLANEOUS) ×1 IMPLANT
PUNCH AORTIC ROTATE 4.5MM 8IN (MISCELLANEOUS) IMPLANT
PUNCH AORTIC ROTATE 5MM 8IN (MISCELLANEOUS) IMPLANT
SET CARDIOPLEGIA MPS 5001102 (MISCELLANEOUS) ×1 IMPLANT
SOLUTION ANTI FOG 6CC (MISCELLANEOUS) IMPLANT
SPONGE GAUZE 4X4 12PLY (GAUZE/BANDAGES/DRESSINGS) ×8 IMPLANT
SPONGE LAP 18X18 X RAY DECT (DISPOSABLE) ×2 IMPLANT
SPONGE LAP 4X18 X RAY DECT (DISPOSABLE) IMPLANT
STRIP CLOSURE SKIN 1/2X4 (GAUZE/BANDAGES/DRESSINGS) ×2 IMPLANT
SUT BONE WAX W31G (SUTURE) ×3 IMPLANT
SUT MNCRL AB 4-0 PS2 18 (SUTURE) ×1 IMPLANT
SUT PROLENE 3 0 SH DA (SUTURE) IMPLANT
SUT PROLENE 3 0 SH1 36 (SUTURE) ×3 IMPLANT
SUT PROLENE 4 0 RB 1 (SUTURE)
SUT PROLENE 4 0 SH DA (SUTURE) IMPLANT
SUT PROLENE 4 0 TF (SUTURE) ×6 IMPLANT
SUT PROLENE 4-0 RB1 .5 CRCL 36 (SUTURE) IMPLANT
SUT PROLENE 5 0 C 1 36 (SUTURE) ×1 IMPLANT
SUT PROLENE 6 0 C 1 30 (SUTURE) ×2 IMPLANT
SUT PROLENE 6 0 CC (SUTURE) ×7 IMPLANT
SUT PROLENE 7 0 BV 1 (SUTURE) IMPLANT
SUT PROLENE 7 0 BV1 MDA (SUTURE) ×4 IMPLANT
SUT PROLENE 7.0 RB 3 (SUTURE) ×1 IMPLANT
SUT PROLENE 8 0 BV175 6 (SUTURE) ×1 IMPLANT
SUT SILK  1 MH (SUTURE)
SUT SILK 1 MH (SUTURE) IMPLANT
SUT SILK 2 0 SH CR/8 (SUTURE) IMPLANT
SUT SILK 3 0 SH CR/8 (SUTURE) IMPLANT
SUT STEEL 6MS V (SUTURE) ×3 IMPLANT
SUT STEEL STERNAL CCS#1 18IN (SUTURE) IMPLANT
SUT STEEL SZ 6 DBL 3X14 BALL (SUTURE) ×3 IMPLANT
SUT VIC AB 1 CTX 18 (SUTURE) ×6 IMPLANT
SUT VIC AB 1 CTX 36 (SUTURE)
SUT VIC AB 1 CTX36XBRD ANBCTR (SUTURE) IMPLANT
SUT VIC AB 2-0 CT1 27 (SUTURE) ×3
SUT VIC AB 2-0 CT1 TAPERPNT 27 (SUTURE) IMPLANT
SUT VIC AB 2-0 CTX 27 (SUTURE) IMPLANT
SUT VIC AB 3-0 SH 27 (SUTURE)
SUT VIC AB 3-0 SH 27X BRD (SUTURE) IMPLANT
SUT VIC AB 3-0 X1 27 (SUTURE) IMPLANT
SUT VICRYL 4-0 PS2 18IN ABS (SUTURE) IMPLANT
SUTURE E-PAK OPEN HEART (SUTURE) ×3 IMPLANT
SYSTEM SAHARA CHEST DRAIN ATS (WOUND CARE) ×3 IMPLANT
TAPE CLOTH SURG 4X10 WHT LF (GAUZE/BANDAGES/DRESSINGS) ×2 IMPLANT
TOWEL OR 17X24 6PK STRL BLUE (TOWEL DISPOSABLE) ×6 IMPLANT
TOWEL OR 17X26 10 PK STRL BLUE (TOWEL DISPOSABLE) ×6 IMPLANT
TRAY FOLEY IC TEMP SENS 14FR (CATHETERS) ×3 IMPLANT
TUBE FEEDING 8FR 16IN STR KANG (MISCELLANEOUS) ×3 IMPLANT
TUBE SUCT INTRACARD DLP 20F (MISCELLANEOUS) ×3 IMPLANT
TUBING INSUFFLATION 10FT LAP (TUBING) ×3 IMPLANT
UNDERPAD 30X30 INCONTINENT (UNDERPADS AND DIAPERS) ×3 IMPLANT
WATER STERILE IRR 1000ML POUR (IV SOLUTION) ×6 IMPLANT

## 2012-01-22 NOTE — Progress Notes (Signed)
Notified Dr. Sampson Goon of intermittent Chest pressure stated I could bring her to OR holding

## 2012-01-22 NOTE — Progress Notes (Signed)
TCTS BRIEF SICU PROGRESS NOTE  Day of Surgery  S/P Procedure(s) (LRB): CORONARY ARTERY BYPASS GRAFTING (CABG) (N/A) TRANSESOPHAGEAL ECHOCARDIOGRAM (TEE) (N/A)   Extubated uneventfully AAI paced w/ stable hemodynamics Breathing comfortably w/ O2 sats 94% on 6 L/min Chest tube output low UOP adequate Labs okay  Plan: Continue routine early postop  Tevita Gomer H 01/22/2012 10:21 PM

## 2012-01-22 NOTE — Addendum Note (Signed)
Addendum  created 01/22/12 1913 by Kipp Brood, MD   Modules edited:Notes Section

## 2012-01-22 NOTE — Preoperative (Signed)
Beta Blockers   Reason not to administer Beta Blockers:Not Applicable; pt took this morning.

## 2012-01-22 NOTE — Anesthesia Procedure Notes (Signed)
Procedure Name: Intubation Date/Time: 01/22/2012 7:43 AM Performed by: Arlice Colt B Pre-anesthesia Checklist: Patient identified, Timeout performed, Emergency Drugs available, Suction available and Patient being monitored Patient Re-evaluated:Patient Re-evaluated prior to inductionOxygen Delivery Method: Circle system utilized Preoxygenation: Pre-oxygenation with 100% oxygen Intubation Type: IV induction Ventilation: Mask ventilation without difficulty and Oral airway inserted - appropriate to patient size Grade View: Grade I Tube type: Oral Tube size: 7.5 mm Number of attempts: 1 Airway Equipment and Method: Patient positioned with wedge pillow and Stylet Placement Confirmation: ETT inserted through vocal cords under direct vision,  positive ETCO2 and breath sounds checked- equal and bilateral Secured at: 22 cm Tube secured with: Tape Dental Injury: Teeth and Oropharynx as per pre-operative assessment

## 2012-01-22 NOTE — Procedures (Signed)
Extubation Procedure Note  Patient Details:   Name: [name] DOB: 01/12/1932 MRN: [id number]   Airway Documentation:     Evaluation  O2 sats: stable throughout Complications: No apparent complications Patient did tolerate procedure well. Bilateral Breath Sounds: Rhonchi   Yes  Pt tolerated wean, -20 NIF VC, positive for cuff leak. Pt extubated to 6lpm [place], no strider heard. RT will continue to monitor.   Parke Poisson 01/22/2012, 6:38 PM

## 2012-01-22 NOTE — Anesthesia Postprocedure Evaluation (Signed)
  Anesthesia Post-op Note  Patient: [name]  Procedure(s) Performed: Procedure(s) (LRB) with comments: CORONARY ARTERY BYPASS GRAFTING (CABG) (N/A) - Coronary Artery Bypass Graft times four utilizing the left internal mammary artery and the left greater saphenous vein harvested endoscopically. TRANSESOPHAGEAL ECHOCARDIOGRAM (TEE) (N/A)  Patient Location: SICU  Anesthesia Type: General  Level of Consciousness: sedated and Patient remains intubated per anesthesia plan  Airway and Oxygen Therapy: Patient remains intubated per anesthesia plan and Patient placed on Ventilator (see vital sign flow sheet for setting)  Post-op Pain: none  Post-op Assessment: Post-op Vital signs reviewed and Patient's Cardiovascular Status Stable  Post-op Vital Signs: stable  Complications: No apparent anesthesia complications

## 2012-01-22 NOTE — Anesthesia Preprocedure Evaluation (Addendum)
Anesthesia Evaluation  Patient identified by MRN, date of birth, ID band Patient awake    Reviewed: Allergy & Precautions, H&P , NPO status , Patient's Chart, lab work & pertinent test results, reviewed documented beta blocker date and time   History of Anesthesia Complications Negative for: history of anesthetic complications  Airway Mallampati: II TM Distance: >3 FB Neck ROM: Full    Dental  (+) Edentulous Upper, Poor Dentition, Partial Lower and Dental Advisory Given   Pulmonary COPDneg recent URI,          Cardiovascular hypertension, Pt. on medications and Pt. on home beta blockers + CAD and + Past MI     Neuro/Psych Anxiety Depression    GI/Hepatic   Endo/Other  diabetes  Renal/GU      Musculoskeletal  (+) Arthritis -, Rheumatoid disorders,    Abdominal   Peds  Hematology   Anesthesia Other Findings   Reproductive/Obstetrics                          Anesthesia Physical Anesthesia Plan  ASA: III  Anesthesia Plan: General   Post-op Pain Management:    Induction: Intravenous  Airway Management Planned: Oral ETT  Additional Equipment: Arterial line, PA Cath, CVP and 3D TEE  Intra-op Plan:   Post-operative Plan:   Informed Consent: I have reviewed the patients History and Physical, chart, labs and discussed the procedure including the risks, benefits and alternatives for the proposed anesthesia with the patient or authorized representative who has indicated his/her understanding and acceptance.   Dental advisory given  Plan Discussed with: CRNA and Surgeon  Anesthesia Plan Comments: (CAD diffuse, 3 vessel with longstanding history of chest pain Smoker  htn Type 2 DM per chart glucose 91  Kipp Brood, MD)        Anesthesia Quick Evaluation

## 2012-01-22 NOTE — Transfer of Care (Signed)
Immediate Anesthesia Transfer of Care Note  Patient: [name]  Procedure(s) Performed: Procedure(s) (LRB) with comments: CORONARY ARTERY BYPASS GRAFTING (CABG) (N/A) - Coronary Artery Bypass Graft times four utilizing the left internal mammary artery and the left greater saphenous vein harvested endoscopically. TRANSESOPHAGEAL ECHOCARDIOGRAM (TEE) (N/A)  Patient Location: SICU  Anesthesia Type: General  Level of Consciousness: sedated and Patient remains intubated per anesthesia plan  Airway & Oxygen Therapy: Patient remains intubated per anesthesia plan and Patient placed on Ventilator (see vital sign flow sheet for setting)  Post-op Assessment: Report given to PACU RN and Post -op Vital signs reviewed and stable  Post vital signs: Reviewed and stable  Complications: No apparent anesthesia complications

## 2012-01-22 NOTE — H&P (Signed)
[address]            [place] [address]          [phone]        DEMPSEY KNOTEK [facility] Medical Record #[id number] Date of Birth: 18-Jul-1931  Referring: Dr Marca Ancona Primary Care: Hannah Beat, MD  Chief Complaint:    angina   History of Present Illness:    Patient is a 78 year old female with known coronary occlusive disease who is referred by Dr. Riley Kill to consider coronary artery bypass grafting. She comes to the office as an outpatient after recent cardiac catheterization. She notes several years of intermittent chest discomfort radiating into her shoulders and with shortness of breath. The discomfort is not clearly exertionally related. She notes an admission to [facility] in August 2012 and was treated medically. She was loaded with Plavix yesterday for cardiac catheterization.      Current Activity/ Functional Status: Patient is independent with mobility/ambulation, transfers, ADL's, IADL's.   Past Medical History  Diagnosis Date  . Hypertension 08/01/1995  . Rheumatoid arthritis 04/03/1983  . Osteoarthritis 04/03/1983  . Anemia, deficiency 07/01/2004    (w/u by Dr. Lorre Nick)  . Diabetes mellitus type II 07/31/1997  . Osteoporosis 12/31/2000  . CAD (coronary artery disease) 09/01/2002    s/p NSTEMI 2012  . Hyperlipemia 07/02/1999  . COPD (chronic obstructive pulmonary disease) 05/31/2004  . diverticulosis of colon   . Anxiety and depression   . Arteriovenous malformation of gastrointestinal tract   . Fatty liver   . Chronic back pain   . History of non-ST elevation myocardial infarction (NSTEMI) 07/22/2011  . Splenic artery aneurysm 1.6 cm in size 2010     Past Surgical History  Procedure Date  . Nasal sinus surgery   . Inguinal hernia repair   . Partial hysterectomy     ovaries intact, dysmennorhea w/ A/P repari  . Carpal tunnel release     bilat  . Trigger finger release     right    . Wrist surgery     neuroma repair, right  . Ankle surgery     neuroma repair, right ORIF  . Total shoulder replacement 12/13/1998    right, ([place])  . Rotator cuff repair 11/22/2003    ([place]) left  . Epidural block injection 05/2005    x 3  . Long finger tendon realignment 10/31/2005    [place]  . Lumbar epidural 06/13/2006  . Laminectomy 06/2006    L4/5 spinal stenosis (Dr. [name])  . Laminectomy 02/24/2008    L3/4 Ant/Lat interbody fusion and Lat Artthrodesis w/plate using XLIF (Dr. [name])  . Colonic embolization 04/18/2011    Dr. Wyn Quaker, acute GI bleed  . Spinal cord stimulator implant- currently turned off     Family History  Problem Relation Age of Onset  . Hypotension Mother   . Dementia Mother   . Alcohol abuse Father   . Heart disease Father     MI, enlarged heart  . Diabetes Brother     History   Social History  . Marital Status: Widowed    Spouse Name: N/A    Number of Children: 4  . Years of Education: N/A   Occupational History  . [employer]     9 hour days-6 days/week   Social History Main  Topics  . Smoking status: Current Some Day Smoker -- 62 years    Types: Cigarettes  . Smokeless tobacco: Never Used   Comment: Smokes a few puffs occasionally  . Alcohol Use: Yes     red wine occassionally    Social History Narrative   Married, widow 1972    History  Smoking status  . Current Some Day Smoker -- 62 years  . Types: Cigarettes  Smokeless tobacco  . Never Used  Comment: Smokes a few puffs occasionally    History  Alcohol Use  . Yes    red wine occassionally     Allergies  Allergen Reactions  . Ciprofloxacin     REACTION: UNSPECIFIED  . Iron Diarrhea  . Morphine Sulfate Nausea And Vomiting    At high doses hallicuniations  . Naproxen Rash    REACTION: UNSPECIFIED    Current Facility-Administered Medications  Medication Dose Route Frequency Provider Last Rate Last Dose  . aminocaproic acid (AMICAR) 10 g in sodium chloride 0.9 %  100 mL infusion   Intravenous To OR Delight Ovens, MD      . cefUROXime (ZINACEF) 1.5 g in dextrose 5 % 50 mL IVPB  1.5 g Intravenous To OR Delight Ovens, MD      . cefUROXime (ZINACEF) 750 mg in dextrose 5 % 50 mL IVPB  750 mg Intravenous To OR Delight Ovens, MD      . chlorhexidine (HIBICLENS) 4 % liquid 2 application  30 mL Topical UD Delight Ovens, MD      . dexmedetomidine (PRECEDEX) 400 mcg / 100 mL infusion  0.1-0.7 mcg/kg/hr Intravenous To OR Delight Ovens, MD      . DOPamine (INTROPIN) 800 mg in dextrose 5 % 250 mL infusion  2-20 mcg/kg/min Intravenous To OR Delight Ovens, MD      . EPINEPHrine (ADRENALIN) 4,000 mcg in dextrose 5 % 250 mL infusion  0.5-20 mcg/min Intravenous To OR Delight Ovens, MD      . heparin 2,500 Units, papaverine 30 mg in electrolyte-148 (PLASMALYTE-148) 500 mL irrigation   Irrigation To OR Delight Ovens, MD      . insulin regular (NOVOLIN R,HUMULIN R) 1 Units/mL in sodium chloride 0.9 % 100 mL infusion   Intravenous To OR Delight Ovens, MD      . magnesium sulfate (IV Push/IM) injection 40 mEq  40 mEq Other To OR Delight Ovens, MD      . metoprolol tartrate (LOPRESSOR) tablet 12.5 mg  12.5 mg Oral Once Delight Ovens, MD      . nitroGLYCERIN 0.2 mg/mL in dextrose 5 % infusion  2-200 mcg/min Intravenous To OR Delight Ovens, MD      . phenylephrine (NEO-SYNEPHRINE) 20,000 mcg in dextrose 5 % 250 mL infusion  30-200 mcg/min Intravenous To OR Delight Ovens, MD      . potassium chloride injection 80 mEq  80 mEq Other To OR Delight Ovens, MD      . vancomycin (VANCOCIN) 1,250 mg in sodium chloride 0.9 % 250 mL IVPB  1,250 mg Intravenous To OR Delight Ovens, MD   1,250 mg at 01/22/12 8295   Facility-Administered Medications Ordered in Other Encounters  Medication Dose Route Frequency Provider Last Rate Last Dose  . fentaNYL (SUBLIMAZE) injection    PRN Jeani Hawking, CRNA   50 mcg at 01/22/12 0656  . lactated  ringers infusion    Continuous PRN  Jeani Hawking, CRNA      . lactated ringers infusion    Continuous PRN Jeani Hawking, CRNA      . midazolam (VERSED) 5 MG/5ML injection    PRN Jeani Hawking, CRNA   1 mg at 01/22/12 0703       Review of Systems:     Cardiac Review of Systems: Y or N  Chest Pain [   y ]  Resting SOB [  n ] Exertional SOB  Cove.Etienne  ]  Pollyann Kennedy Milo.Brash  ]   Pedal Edema [ nloss  ]    Palpitations [n  ] Syncope  Milo.Brash  ]   Presyncope [n   ]  General Review of Systems: [Y] = yes [  ]=no Constitional: recent weight change [loss 20 lbs  ]; anorexia [  ]; fatigue [  ]; nausea [  ]; night sweats [  ]; fever [  ]; or chills [  ];                                                                                                                                          Dental: poor dentition[ y ]; Last Dentist visit: recent root cannnal  Eye : blurred vision [  ]; diplopia [   ]; vision changes [  ];  Amaurosis fugax[  ]; Resp: cough [  ];  wheezing[  ];  hemoptysis[  ]; shortness of breath[  ]; paroxysmal nocturnal dyspnea[  ]; dyspnea on exertion[  ]; or orthopnea[  ];  GI:  gallstones[ n ], vomiting[n  ];  dysphagia[  ]; melena[y  ];  hematochezia [ y]; heartburn[  ];   Hx of  Colonoscopy[ y ]; GU: kidney stones [  ]; hematuria[  ];   dysuria [  ];  nocturia[  ];  history of     obstruction [  ];             Skin: rash, swelling[  ];, hair loss[  ];  peripheral edema[  ];  or itching[  ]; Musculosketetal: myalgias[  ];  joint swelling[  ];  joint erythema[  ];  joint pain[  ];  back pain[  ];  Heme/Lymph: bruising[  ];  bleeding[  ];  anemia[  ];  Neuro: TIA[  ];  headaches[  ];  stroke[  ];  vertigo[  ];  seizures[  ];   paresthesias[  ];  difficulty walking[  ];  Psych:depression[  ]; anxiety[  ];  Endocrine: diabetes[  ];  thyroid dysfunction[  ];  Immunizations: Flu [ 02/2011 ]; Pneumococcal[ 05/31/2004 ];   Other: history of c diff  5 years ago  Physical Exam: BP 137/65  Pulse 55   Temp 98.5 F (36.9 C) (Oral)  Resp 18  Wt 135 lb (61.236 kg)  SpO2 96%  General appearance: alert, cooperative,  appears stated age, no distress and mildly obese Neurologic: intact Heart: regular rate and rhythm, S1, S2 normal, no murmur, click, rub or gallop and normal apical impulse Lungs: clear to auscultation bilaterally and normal percussion bilaterally Abdomen: soft, non-tender; bowel sounds normal; no masses,  no organomegaly Extremities: extremities normal, atraumatic, no cyanosis or edema and Homans sign is negative, no sign of DVT Patient has no carotid bruits She has mild varicosities in the left leg, she notes that she has chronic back pain that bothers her left leg more than the right and prefers pain to be harvested from the right leg  Diagnostic Studies & Laboratory data:     Recent Radiology Findings:  No results found.   Recent Lab Findings: Lab Results  Component Value Date   WBC 8.3 01/18/2012   HGB 11.2* 01/18/2012   HCT 33.3* 01/18/2012   PLT 183 01/18/2012   GLUCOSE 91 01/18/2012   CHOL 100 02/08/2011   TRIG 85 10/29/2011   HDL 32* 10/29/2011   LDLCALC 73 10/29/2011   ALT 14 01/18/2012   AST 17 01/18/2012   NA 140 01/18/2012   K 3.5 01/18/2012   CL 104 01/18/2012   CREATININE 0.69 01/18/2012   BUN 16 01/18/2012   CO2 22 01/18/2012   TSH 1.28 02/08/2011   INR 1.05 01/18/2012   HGBA1C 5.7* 01/18/2012   Cardiac Catheterization Procedure Note  Name: MERRIL HOLK  MRN: [id number]  DOB: Feb 08, 1932  Procedure: Left Heart Cath, Selective Coronary Angiography, LV angiography  Indication: Progressive angina, known CAD.  Procedural details: The right groin was prepped, draped, and anesthetized with 1% lidocaine. Using modified Seldinger technique, a 5 French sheath was introduced into the right femoral artery. Standard Judkins catheters were used for coronary angiography. No left ventriculography due to CKD. Catheter exchanges were performed over a guidewire.  There were no immediate procedural complications. The patient was transferred to the post catheterization recovery area for further monitoring.  Procedural Findings:  Hemodynamics:  AO 131/62  LV 142/17  Coronary angiography:  Coronary dominance: Left  Left mainstem: 30% ostial left main stenosis.  Left anterior descending (LAD): Up to 90% stenosis in the proximal LAD at the take-off of the first septal perforator. This area is heavily calcified. There was a large D1 beyond the LAD stenosis with 50% ostial disease.  Left circumflex (LCx): Dominant vessel with 40% mid-vessel stenosis and 50% distal stenosis.  Right coronary artery (RCA): Diffuse proximal to mid up to 95% stenosis in a small, nondominant RCA.  Left ventriculography: Not done due to CKD.  Final Conclusions: The disease in the nondominant RCA appears unchanged. There is an up to 90% proximal LAD stenosis that appears progressive from prior study several years ago. She has been having progressive anginal symptoms over the last 2 wks. She has had some relief after I started her on Imdur at last appointment. Continue Imdur. Will start Plavix, loading 300 mg today in the cath lab and continuing 75 mg daily at home. I discussed with Dr. Riley Kill and will plan PCI on Monday.  Marca Ancona  01/11/2012, 12:51 PM   NO ECHO found having been done  Assessment / Plan:      Patient with three-vessel coronary artery disease including high-grade proximal LAD stenosis, 70% distal circumflex total occlusion of the right coronary artery with ongoing anginal symptoms for over a year getting worse lately. The patient had been considered for angioplasty, but now comes to the office to discuss coronary artery  bypass grafting after patient had discussion with Dr. Riley Kill.  The risks and options of surgery were discussed with her in detail including the risks of surgery.  The goals risks and alternatives of the planned surgical procedure CABG  have  been discussed with the patient in detail. The risks of the procedure including death, infection, stroke, myocardial infarction, bleeding, blood transfusion have all been discussed specifically.  I have quoted [name] a 5 % of perioperative mortality and a complication rate as high as 25%. The patient's questions have been answered.Fatumata A Varin is willing  to proceed with the planned procedure. Delight Ovens MD  Beeper [phone] Office [phone] 01/22/2012 7:24 AM

## 2012-01-22 NOTE — Brief Op Note (Addendum)
01/22/2012  10:53 AM  PATIENT:  [name]  78 y.o. female  PRE-OPERATIVE DIAGNOSIS:  CAD  POST-OPERATIVE DIAGNOSIS:  coronary artery disease  PROCEDURE:   CORONARY ARTERY BYPASS GRAFTING x 4 (LIMA-LAD, SVG-D, SVG-Cx, SVG-RCA), EVH Left leg                                                                           SURGEON:  Surgeon(s): Delight Ovens, MD  ASSISTANT: Coral Ceo, PA-C  ANESTHESIA:   general  PATIENT CONDITION:  ICU - intubated and hemodynamically stable.  PRE-OPERATIVE WEIGHT: 62 kg  FINDINGS: Right leg vein explored, but was too small to use  a & V wire, foley rt ij/sg aline left arm

## 2012-01-22 NOTE — CV Procedure (Signed)
Intra-operative Transesophageal Echocardiography Report:  [name] is a 56 tear old female with a history of known coronary disease who presented with worsening anginal symptoms and underwent cardiac catheterization on 01/11/12 by Dr.McLean. She was found to have three vessel coronary disease and was scheduled  to undergo coronary artery bypass grafting by Dr. Lowella Fairy.  Intraoperative transesophageal echocardiography was indicated to evaluate  for any valvular pathology and to serve as a monitor for right and left ventricular function.   The patient was brought to the operating room at [facility] and general anesthesia was induced without difficulty. Following  endotracheal intubation and oral gastric suctioning,  the transesophageal echocardiography probe was inserted without difficulty.  Impression: Pre-bypass Findings:  1.  Aortic valve: The aortic valve was tri-leaflet and opened normally. There was no aortic insufficiency. The leaflets were mildly thickened but not calcified.  2.  Mitral valve: there was moderate mitral annular calcification involving the bases  of the anterior and posterior leaflets. Some calcification extended into the body of the anterior leaflet. The leaflets coapted normally and there was trace mitral sufficiency. There was no billowing or prolapse of the leaflets noted.  3.  Left Ventricle: There was normal appearing left ventricular systolic function. The left ventricular end-diastolic diameter measured 3.9 cm at the cordal level. LV wall thickness measured 0.8 to 0.9 cm at end-diastole at the mid-papillary level. There were no regional wall motion abnormalities appreciated and the estimated ejection fraction was 60%.  4.  Right ventricle: The right ventricle was of normal size and there was normal contractility of the right ventricular free wall.  5.  Tricuspid valve: The tricuspid leaflets appeared structurally normal and there was trace tricuspid  insufficiency.  6.  Inter-atrial septum: The inter-atrial was intact and there was no evidence of patent foramen ovale or atrial septal defect by color Doppler bubble study.  7.  Left atrium: The left atrium appeared to be of normal size and there was no thrombus noted in the left atrial cavity or left atrial appendage.  8. Ascending Aorta:  The ascending aorta was of normal size with a well-defined aortic root and sinotubular ridge without effacement. There was some thickening noted in the wall of the ascending aorta but no significant atheromatous disease was appreciated.  Post-bypass Findings:   1. Aortic valve:  The aortic valve was unchanged from the pre-bypass study. There was no aortic insufficiency and there was normal opening of the leaflets.  2.  Mitral Valve: The mitral valve was unchanged from the pre-bypass study and there was trace mitral insufficiency.  3.  Left ventricle: There was normal appearing left ventricular systolic function with no regional wall motion abnormalities and the ejection fraction was 55 to 60%.  4.  Right ventricle: The right ventricular function was normal and appeared unchanged from the pre-bypass study.  5.  Tricuspid valve: There was normal appearing tricuspid function with trace tricuspid  insufficiency.   Kipp Brood, M.D.

## 2012-01-22 NOTE — Progress Notes (Signed)
Dentures given to family.

## 2012-01-23 ENCOUNTER — Inpatient Hospital Stay (HOSPITAL_COMMUNITY): Payer: Medicare Other

## 2012-01-23 ENCOUNTER — Encounter (HOSPITAL_COMMUNITY): Payer: Self-pay | Admitting: Cardiothoracic Surgery

## 2012-01-23 LAB — CBC
HCT: 30 % — ABNORMAL LOW (ref 36.0–46.0)
HCT: 31.2 % — ABNORMAL LOW (ref 36.0–46.0)
Hemoglobin: 10.8 g/dL — ABNORMAL LOW (ref 12.0–15.0)
MCH: 32 pg (ref 26.0–34.0)
MCHC: 34.6 g/dL (ref 30.0–36.0)
MCV: 89.8 fL (ref 78.0–100.0)
MCV: 92.6 fL (ref 78.0–100.0)
RBC: 3.34 MIL/uL — ABNORMAL LOW (ref 3.87–5.11)
RDW: 14.3 % (ref 11.5–15.5)
WBC: 8.9 10*3/uL (ref 4.0–10.5)

## 2012-01-23 LAB — POCT I-STAT, CHEM 8
BUN: 13 mg/dL (ref 6–23)
Calcium, Ion: 1.15 mmol/L (ref 1.13–1.30)
Chloride: 102 mEq/L (ref 96–112)
Creatinine, Ser: 0.8 mg/dL (ref 0.50–1.10)
Glucose, Bld: 134 mg/dL — ABNORMAL HIGH (ref 70–99)
HCT: 31 % — ABNORMAL LOW (ref 36.0–46.0)
Hemoglobin: 10.5 g/dL — ABNORMAL LOW (ref 12.0–15.0)
Potassium: 3.8 mEq/L (ref 3.5–5.1)
Sodium: 138 mEq/L (ref 135–145)
TCO2: 23 mmol/L (ref 0–100)

## 2012-01-23 LAB — MAGNESIUM
Magnesium: 2.3 mg/dL (ref 1.5–2.5)
Magnesium: 2.1 mg/dL (ref 1.5–2.5)

## 2012-01-23 LAB — GLUCOSE, CAPILLARY
Glucose-Capillary: 114 mg/dL — ABNORMAL HIGH (ref 70–99)
Glucose-Capillary: 120 mg/dL — ABNORMAL HIGH (ref 70–99)
Glucose-Capillary: 129 mg/dL — ABNORMAL HIGH (ref 70–99)
Glucose-Capillary: 130 mg/dL — ABNORMAL HIGH (ref 70–99)
Glucose-Capillary: 132 mg/dL — ABNORMAL HIGH (ref 70–99)
Glucose-Capillary: 146 mg/dL — ABNORMAL HIGH (ref 70–99)

## 2012-01-23 LAB — CREATININE, SERUM: GFR calc non Af Amer: 81 mL/min — ABNORMAL LOW (ref >90)

## 2012-01-23 LAB — BASIC METABOLIC PANEL
BUN: 12 mg/dL (ref 6–23)
CO2: 22 mEq/L (ref 19–32)
Chloride: 106 mEq/L (ref 96–112)
Creatinine, Ser: 0.7 mg/dL (ref 0.50–1.10)
GFR calc Af Amer: >90 mL/min (ref >90)
Potassium: 3.2 mEq/L — ABNORMAL LOW (ref 3.5–5.1)

## 2012-01-23 MED ORDER — POTASSIUM CHLORIDE 10 MEQ/50ML IV SOLN
10.0000 meq | INTRAVENOUS | Status: AC | PRN
  Administered 2012-01-23 (×3): 10 meq via INTRAVENOUS

## 2012-01-23 MED ORDER — POTASSIUM CHLORIDE 10 MEQ/50ML IV SOLN
INTRAVENOUS | Status: AC
  Administered 2012-01-25: 10 meq via INTRAVENOUS
  Filled 2012-01-23: qty 50

## 2012-01-23 MED ORDER — INSULIN ASPART 100 UNIT/ML ~~LOC~~ SOLN
0.0000 [IU] | SUBCUTANEOUS | Status: DC
  Administered 2012-01-23 – 2012-01-25 (×5): 2 [IU] via SUBCUTANEOUS
  Administered 2012-01-25: 8 [IU] via SUBCUTANEOUS
  Administered 2012-01-25: 2 [IU] via SUBCUTANEOUS

## 2012-01-23 MED ORDER — POTASSIUM CHLORIDE 10 MEQ/50ML IV SOLN
INTRAVENOUS | Status: AC
  Administered 2012-01-23: 10 meq via INTRAVENOUS
  Filled 2012-01-23: qty 150

## 2012-01-23 MED ORDER — POTASSIUM CHLORIDE 10 MEQ/50ML IV SOLN
10.0000 meq | Freq: Once | INTRAVENOUS | Status: AC
  Administered 2012-01-23: 10 meq via INTRAVENOUS

## 2012-01-23 MED FILL — Magnesium Sulfate Inj 50%: INTRAMUSCULAR | Qty: 10 | Status: AC

## 2012-01-23 MED FILL — Potassium Chloride Inj 2 mEq/ML: INTRAVENOUS | Qty: 40 | Status: AC

## 2012-01-23 MED FILL — Dexmedetomidine HCl IV Soln 200 MCG/2ML: INTRAVENOUS | Qty: 2 | Status: AC

## 2012-01-23 NOTE — Op Note (Signed)
NAMEGLENYCE, [name] NO.:  [id number]  MEDICAL RECORD NO.:  [id number]  LOCATION:  2306                         FACILITY:  [facility]  PHYSICIAN:  Sheliah Plane, MD    DATE OF BIRTH:  03-May-1931  DATE OF PROCEDURE:  01/22/2012 DATE OF DISCHARGE:                              OPERATIVE REPORT   PREOPERATIVE DIAGNOSIS:  Coronary occlusive disease with unstable angina.  POSTOPERATIVE DIAGNOSIS:  Coronary occlusive disease with unstable angina.  SURGICAL PROCEDURE:  Coronary artery bypass grafting x4 with the left internal mammary to the left anterior descending coronary artery, reverse saphenous vein graft to the diagonal coronary artery, reverse saphenous vein graft to the distal circumflex and reverse saphenous vein graft to the distal right coronary artery with left leg endo vein harvesting.  SURGEON:  Sheliah Plane, MD  FIRST ASSISTANT:  Coral Ceo, PA  BRIEF HISTORY:  The patient is a 78 year old female, who recently underwent cardiac catheterization because of unstable anginal symptoms. Angioplasty had been considered but ultimately the patient was referred for coronary artery bypass grafting because of the complex nature of her LAD disease and 3-vessel coronary artery disease.  The patient was seen as an outpatient.  Her Plavix was stopped and she was admitted for coronary artery bypass grafting.  Risks and options of surgery were discussed with the patient in detail and she was willing to proceed.  DESCRIPTION OF PROCEDURE:  With Swan-Ganz and arterial line monitors in place, the patient underwent general endotracheal anesthesia without incident.  Skin of chest and legs were prepped with Betadine and draped in usual sterile manner using the [employer]ant endo vein harvest system. Vein was harvested from the left thigh and calf.  Prior to this, the right leg was opened and started to be dissected out but it became apparent that this vein was very small  and probably would not be of good quality, so stopped and moved to the left leg which was after harvested was good quality and caliber conduit.  A median sternotomy was performed.  Left internal mammary artery was dissected down as pedicle graft.  The distal artery was divided, had good flow.  Pericardium was opened.  Overall ventricular function appeared preserved.  The patient was systemically heparinized.  The ascending aorta was cannulated, right atrium was cannulated.  Aortic root vent, cardioplegia needle was introduced into the ascending aorta.  The patient was placed on cardiopulmonary bypass 2.4 L/minute per meter squared.  Sites of anastomosis were selected and dissected out of the epicardium.  The aortic crossclamp was applied and 500 mL of cold blood potassium cardioplegia was administered with diastolic arrest of the heart. Myocardial septal temperatures monitored throughout the crossclamp. Attention was turned first to the right coronary artery which was totally occluded.  The vessel was opened and admitted 1.5 mm probe. Using a running 7-0 Prolene, distal anastomosis was performed.  The heart was then elevated and the large distal circumflex branch was opened and admitted 1.5 mm probe.  This vessel had a 60-70% stenosis. The very small companion vessel less than 1 mm, which was occluded was too small for bypass.  Distal anastomosis with a 7-0 Prolene was performed.  Attention was then turned to the diagonal coronary artery, which was a very large branch of the LAD.  This vessel was opened and admitted 1.5 mm probe.  Using a running 7-0 Prolene, distal anastomosis was performed.  The distal LAD was then opened, was reasonable size vessel, admitted 1.5 mm probe distally.  Using a running 8-0 Prolene, left internal mammary artery was anastomosed to the left anterior descending coronary artery.  With release of the bulldog on the mammary artery, there was rise in myocardial  septal temperature.  Bulldog was placed back on the mammary artery with crossclamp still in place, 3 punch aortotomies were performed in the ascending aorta.  Each of the 3 grafts were anastomosed to the ascending aorta.  Air was evacuated from the grafts and aortic crossclamp was removed with total crossclamp time of 73 minutes.  Sites of anastomosis were inspected free of bleeding. The patient required electric defibrillation and returned to sinus rhythm.  She was then atrially paced to increase rate with the body temperature rewarmed to 37 degrees.  She was then ventilated and weaned from cardiopulmonary bypass without difficulty.  She remained hemodynamically stable.  She was decannulated in usual fashion. Protamine sulfate was administered.  With operative field hemostatic, left pleural tube and Blake mediastinal drain were left in place. Sternum was closed with #6 stainless steel wire.  Fascia closed with interrupted 0 Vicryl, running 3-0 Vicryl in subcutaneous tissue, and 4-0 subcuticular stitch in skin edges.  Dry dressings were applied.  Sponge and needle count was reported as correct at the completion of the procedure.  The patient tolerated the procedure without obvious complication and was transferred to Surgical Intensive Care Unit for further postop care.  Total pump time was 101 minutes.  The patient had preoperative anemia and so because of low hematocrit while on bypass, required 2 units of packed red blood cells.     Sheliah Plane, MD     EG/MEDQ  D:  01/23/2012  T:  01/23/2012  Job:  [id number]

## 2012-01-23 NOTE — Progress Notes (Addendum)
TCTS DAILY PROGRESS NOTE                   [address]            [name] [address]          [phone]      1 Day Post-Op Procedure(s) (LRB): CORONARY ARTERY BYPASS GRAFTING (CABG) (N/A) TRANSESOPHAGEAL ECHOCARDIOGRAM (TEE) (N/A)  Total Length of Stay:  LOS: 1 day   Subjective: Some discomfort, primarily sternal incisional pain  Objective: Vital signs in last 24 hours: Temp:  [96.4 F (35.8 C)-100.6 F (38.1 C)] 100 F (37.8 C) (10/23 0700) Pulse Rate:  [90-94] 92  (10/23 0700) Cardiac Rhythm:  [-] Atrial paced (10/23 0700) Resp:  [10-33] 29  (10/23 0700) BP: (102-162)/(51-90) 123/65 mmHg (10/23 0700) SpO2:  [91 %-99 %] 94 % (10/23 0727) Arterial Line BP: (91-175)/(40-85) 136/56 mmHg (10/23 0700) FiO2 (%):  [39.8 %-50.3 %] 40.2 % (10/22 1800) Weight:  [144 lb 10 oz (65.6 kg)] 144 lb 10 oz (65.6 kg) (10/23 0500)  Filed Weights   01/21/12 1500 01/23/12 0500  Weight: 135 lb (61.236 kg) 144 lb 10 oz (65.6 kg)    Weight change: 9 lb 10 oz (4.364 kg)   Hemodynamic parameters for last 24 hours: PAP: (16-36)/(3-16) 24/7 mmHg CO:  [3.2 L/min-6.2 L/min] 6.2 L/min CI:  [2 L/min/m2-3.9 L/min/m2] 3.9 L/min/m2  Intake/Output from previous day: 10/22 0701 - 10/23 0700 In: 6094.5 [I.V.:3969.5; Blood:625; IV Piggyback:1500] Out: 5860 [Urine:3865; Blood:1625; Chest Tube:370]  Intake/Output this shift:    Current Meds: Scheduled Meds:   . acetaminophen  1,000 mg Intravenous Once  . acetaminophen  1,000 mg Oral Q6H   Or  . acetaminophen (TYLENOL) oral liquid 160 mg/5 mL  975 mg Per Tube Q6H  . aspirin EC  325 mg Oral Daily   Or  . aspirin  324 mg Per Tube Daily  . atorvastatin  40 mg Oral Daily  . bisacodyl  10 mg Oral Daily   Or  . bisacodyl  10 mg Rectal Daily  . cefUROXime (ZINACEF)  IV  1.5 g Intravenous To OR  . cefUROXime (ZINACEF)  IV  1.5 g Intravenous Q12H  . docusate sodium  200 mg Oral Daily  . famotidine (PEPCID) IV  20 mg Intravenous  Q12H  . heparin-papaverine-plasmalyte irrigation   Irrigation To OR  . insulin aspart  0-24 Units Subcutaneous Q2H   Followed by  . insulin aspart  0-24 Units Subcutaneous Q4H  . levalbuterol  0.63 mg Nebulization Q6H  . magnesium sulfate  4 g Intravenous Once  . metoprolol tartrate  12.5 mg Oral BID   Or  . metoprolol tartrate  12.5 mg Per Tube BID  . pantoprazole  40 mg Oral Daily  . potassium chloride  10 mEq Intravenous Q1 Hr x 3  . sodium chloride  3 mL Intravenous Q12H  . vancomycin  1,000 mg Intravenous Once  . DISCONTD: cefUROXime (ZINACEF)  IV  750 mg Intravenous To OR  . DISCONTD: chlorhexidine  30 mL Topical UD  . DISCONTD: dexmedetomidine  0.1-0.7 mcg/kg/hr Intravenous To OR  . DISCONTD: DOPamine  2-20 mcg/kg/min Intravenous To OR  . DISCONTD: epinephrine  0.5-20 mcg/min Intravenous To OR  . DISCONTD: insulin (NOVOLIN-R) infusion   Intravenous To OR  . DISCONTD: insulin regular  0-10 Units Intravenous TID WC  . DISCONTD: magnesium sulfate  40 mEq Other To OR  . DISCONTD: metoprolol tartrate  12.5 mg  Oral Once  . DISCONTD: nitroGLYCERIN  2-200 mcg/min Intravenous To OR  . DISCONTD: phenylephrine (NEO-SYNEPHRINE) Adult infusion  30-200 mcg/min Intravenous To OR  . DISCONTD: potassium chloride  80 mEq Other To OR   Continuous Infusions:   . sodium chloride 20 mL/hr (01/22/12 1341)  . sodium chloride 20 mL (01/22/12 1341)  . sodium chloride    . dexmedetomidine 0.1 mcg/kg/hr (01/22/12 1500)  . insulin (NOVOLIN-R) infusion 0.6 Units/hr (01/22/12 1400)  . lactated ringers 20 mL/hr (01/22/12 1343)  . nitroGLYCERIN 40 mcg/min (01/23/12 0700)  . phenylephrine (NEO-SYNEPHRINE) Adult infusion Stopped (01/22/12 1900)   PRN Meds:.albumin human, lactated ringers, levalbuterol, metoprolol, midazolam, morphine injection, morphine injection, ondansetron (ZOFRAN) IV, oxyCODONE, potassium chloride, sodium chloride, DISCONTD: 0.9 % irrigation (POUR BTL), DISCONTD: hemostatic agents,  DISCONTD: Surgifoam 1 Gm with 0.9% sodium chloride (4 ml) topical solution  General appearance: alert, cooperative and no distress Heart: regular rate and rhythm and faint rub with CT's in place Lungs: fairly clear anteriorly Abdomen: soft, nontender Extremities: no significant edema Wound: dressings CDI  Lab Results: CBC: Basename 01/23/12 0415 01/22/12 1900  WBC 8.9 11.0*  HGB 10.4* 11.8*  HCT 30.0* 34.4*  PLT 86* 95*   BMET:  Basename 01/23/12 0415 01/22/12 1900 01/22/12 1859  NA 137 -- 141  K 3.2* -- 4.1  CL 106 -- 107  CO2 22 -- --  GLUCOSE 118* -- 149*  BUN 12 -- 12  CREATININE 0.70 0.68 --  CALCIUM 7.8* -- --    PT/INR:  Basename 01/22/12 1303  LABPROT 17.9*  INR 1.52*   Radiology: Dg Chest Portable 1 View  01/22/2012  *RADIOLOGY REPORT*  Clinical Data: Postop CABG  PORTABLE CHEST - 1 VIEW  Comparison: Chest x-ray of 01/18/2012  Findings: The tip of the endotracheal tube is approximately 4.1 cm above the carina.  A left chest tube is present and no pneumothorax is seen.  Bibasilar linear atelectasis is noted and there is slight elevation of the right hemidiaphragm as noted previously.  Mild cardiomegaly is stable.  Swan-Ganz catheter is unchanged in position.  Neurostimulator electrode leads are present overlying the lower thoracic spinal canal.  No acute bony abnormality is seen.  IMPRESSION:  1.  Endotracheal tip 4.1 cm above the carina. 2.  Bibasilar linear atelectasis. 3.  Left chest tube present.  No pneumothorax.   Original Report Authenticated By: [name], M.D.      Assessment/Plan: S/P Procedure(s) (LRB): CORONARY ARTERY BYPASS GRAFTING (CABG) (N/A) TRANSESOPHAGEAL ECHOCARDIOGRAM (TEE) (N/A)  1. Doing well 2 hemodynamically stable off pressors. Wean nitro as able 3 ct's with 370 out post op- poss d/c soon 4 exp ABL anemia- stable, monitor 5 excellent UO, replace K+, will prob need some diuretic, renal function normal 6 thrombocytopenia- monitor,  INR sl elevated, monitor. Not bleeding 7 see progression orders    [name] 01/23/2012 7:59 AM   preop anemia of unknown cause Mobilize patient I have seen and examined [name] and agree with the above assessment  and plan.  Melanie Ovens MD Beeper [phone] Office [phone] 01/23/2012 9:00 AM

## 2012-01-23 NOTE — Progress Notes (Signed)
Found on 5lpm 

## 2012-01-23 NOTE — Progress Notes (Signed)
Pt requested prayer from Chaplain prior to open heart surgery. Pt was very pleasant and appreciated the pastoral care provided.  Marjory Lies Chaplain

## 2012-01-23 NOTE — Progress Notes (Signed)
Patient examined and record reviewed.Hemodynamics stable,labs satisfactory.Patient had stable day.Continue current care. Patient ambulated well today Maintaining sinus rhythm evening labs satisfactory Excellent diuresis today Her potassium 3.8- will supplement  [name],[name] 01/23/2012

## 2012-01-24 ENCOUNTER — Inpatient Hospital Stay (HOSPITAL_COMMUNITY): Payer: Medicare Other

## 2012-01-24 LAB — CBC
HCT: 30.6 % — ABNORMAL LOW (ref 36.0–46.0)
MCV: 93.9 fL (ref 78.0–100.0)
RDW: 14.3 % (ref 11.5–15.5)
WBC: 9.9 10*3/uL (ref 4.0–10.5)

## 2012-01-24 LAB — GLUCOSE, CAPILLARY
Glucose-Capillary: 116 mg/dL — ABNORMAL HIGH (ref 70–99)
Glucose-Capillary: 167 mg/dL — ABNORMAL HIGH (ref 70–99)
Glucose-Capillary: 194 mg/dL — ABNORMAL HIGH (ref 70–99)
Glucose-Capillary: 208 mg/dL — ABNORMAL HIGH (ref 70–99)
Glucose-Capillary: 89 mg/dL (ref 70–99)
Glucose-Capillary: 91 mg/dL (ref 70–99)

## 2012-01-24 LAB — BASIC METABOLIC PANEL
CO2: 26 mEq/L (ref 19–32)
Chloride: 105 mEq/L (ref 96–112)
Creatinine, Ser: 0.73 mg/dL (ref 0.50–1.10)

## 2012-01-24 MED ORDER — FUROSEMIDE 20 MG PO TABS
20.0000 mg | ORAL_TABLET | Freq: Every day | ORAL | Status: DC
  Filled 2012-01-24: qty 1

## 2012-01-24 MED ORDER — POTASSIUM CHLORIDE CRYS ER 20 MEQ PO TBCR
20.0000 meq | EXTENDED_RELEASE_TABLET | Freq: Once | ORAL | Status: AC
  Administered 2012-01-24: 20 meq via ORAL
  Filled 2012-01-24: qty 1

## 2012-01-24 MED ORDER — ACETYLCYSTEINE 20 % IN SOLN
1.0000 mL | Freq: Four times a day (QID) | RESPIRATORY_TRACT | Status: DC
  Administered 2012-01-24 (×3): 1 mL via RESPIRATORY_TRACT
  Administered 2012-01-25: 30 mL via RESPIRATORY_TRACT
  Administered 2012-01-25 – 2012-01-29 (×11): 1 mL via RESPIRATORY_TRACT
  Filled 2012-01-24 (×28): qty 4

## 2012-01-24 MED ORDER — ACETYLCYSTEINE 10 % IN SOLN
2.0000 mL | Freq: Four times a day (QID) | RESPIRATORY_TRACT | Status: DC
  Filled 2012-01-24 (×6): qty 4

## 2012-01-24 MED ORDER — ALPRAZOLAM 0.5 MG PO TABS
0.5000 mg | ORAL_TABLET | Freq: Every evening | ORAL | Status: DC | PRN
  Administered 2012-01-24 – 2012-01-28 (×3): 0.5 mg via ORAL
  Filled 2012-01-24 (×4): qty 1

## 2012-01-24 MED ORDER — FUROSEMIDE 10 MG/ML IJ SOLN
40.0000 mg | Freq: Two times a day (BID) | INTRAMUSCULAR | Status: DC
  Administered 2012-01-24 – 2012-01-25 (×4): 40 mg via INTRAVENOUS
  Filled 2012-01-24 (×5): qty 4

## 2012-01-24 MED ORDER — POTASSIUM CHLORIDE CRYS ER 20 MEQ PO TBCR
20.0000 meq | EXTENDED_RELEASE_TABLET | Freq: Every day | ORAL | Status: DC
  Administered 2012-01-25 – 2012-01-26 (×2): 20 meq via ORAL
  Filled 2012-01-24 (×4): qty 1

## 2012-01-24 NOTE — Progress Notes (Addendum)
TCTS DAILY PROGRESS NOTE                   [address]            [name] [address]          [phone]      2 Days Post-Op Procedure(s) (LRB): CORONARY ARTERY BYPASS GRAFTING (CABG) (N/A) TRANSESOPHAGEAL ECHOCARDIOGRAM (TEE) (N/A)  Total Length of Stay:  LOS: 2 days   Subjective: C/O being "Tired" ,+ back pain  Objective: Vital signs in last 24 hours: Temp:  [97.8 F (36.6 C)-100.7 F (38.2 C)] 98.2 F (36.8 C) (10/24 0400) Pulse Rate:  [69-93] 86  (10/24 0700) Cardiac Rhythm:  [-] Normal sinus rhythm (10/23 2000) Resp:  [19-29] 27  (10/24 0700) BP: (106-153)/(45-96) 136/55 mmHg (10/24 0700) SpO2:  [92 %-98 %] 94 % (10/24 0700) Arterial Line BP: (128)/(49) 128/49 mmHg (10/23 1000) Weight:  [147 lb 7.8 oz (66.9 kg)] 147 lb 7.8 oz (66.9 kg) (10/24 0200)  Filed Weights   01/21/12 1500 01/23/12 0500 01/24/12 0200  Weight: 135 lb (61.236 kg) 144 lb 10 oz (65.6 kg) 147 lb 7.8 oz (66.9 kg)    Weight change: 2 lb 13.9 oz (1.3 kg)   Hemodynamic parameters for last 24 hours: PAP: (21)/(8) 21/8 mmHg  Intake/Output from previous day: 10/23 0701 - 10/24 0700 In: 1834 [P.O.:1070; I.V.:564; IV Piggyback:200] Out: 1025 [Urine:925; Chest Tube:100]  Intake/Output this shift:    Current Meds: Scheduled Meds:   . acetaminophen  1,000 mg Oral Q6H   Or  . acetaminophen (TYLENOL) oral liquid 160 mg/5 mL  975 mg Per Tube Q6H  . aspirin EC  325 mg Oral Daily   Or  . aspirin  324 mg Per Tube Daily  . atorvastatin  40 mg Oral Daily  . bisacodyl  10 mg Oral Daily   Or  . bisacodyl  10 mg Rectal Daily  . cefUROXime (ZINACEF)  IV  1.5 g Intravenous Q12H  . docusate sodium  200 mg Oral Daily  . famotidine (PEPCID) IV  20 mg Intravenous Q12H  . insulin aspart  0-24 Units Subcutaneous Q4H  . insulin aspart  0-24 Units Subcutaneous Q4H  . levalbuterol  0.63 mg Nebulization Q6H  . metoprolol tartrate  12.5 mg Oral BID   Or  . metoprolol tartrate  12.5 mg Per  Tube BID  . pantoprazole  40 mg Oral Daily  . potassium chloride  10 mEq Intravenous Once  . potassium chloride      . sodium chloride  3 mL Intravenous Q12H   Continuous Infusions:   . sodium chloride 20 mL/hr (01/23/12 2256)  . sodium chloride 20 mL (01/22/12 1341)  . sodium chloride    . dexmedetomidine 0.1 mcg/kg/hr (01/22/12 1500)  . lactated ringers 20 mL/hr (01/22/12 1343)  . nitroGLYCERIN 20 mcg/min (01/23/12 0900)  . phenylephrine (NEO-SYNEPHRINE) Adult infusion Stopped (01/22/12 1900)   PRN Meds:.albumin human, levalbuterol, metoprolol, morphine injection, ondansetron (ZOFRAN) IV, oxyCODONE, potassium chloride, sodium chloride, DISCONTD: midazolam  General appearance: alert, cooperative, fatigued and no distress Neurologic: intact Heart: regular rate and rhythm Lungs: dim in bases Abdomen: +BS, soft, nontender Extremities: no edema Wound: dressings CDI  Lab Results: CBC: Basename 01/24/12 0400 01/23/12 1858  WBC 9.9 9.6  HGB 10.4* 10.8*  HCT 30.6* 31.2*  PLT 80* 87*   BMET:  Basename 01/24/12 0400 01/23/12 1858 01/23/12 1850 01/23/12 0415  NA 136 -- 138 --  K 4.1 --  3.8 --  CL 105 -- 102 --  CO2 26 -- -- 22  GLUCOSE 85 -- 134* --  BUN 14 -- 13 --  CREATININE 0.73 0.69 -- --  CALCIUM 7.9* -- -- 7.8*    PT/INR:  Basename 01/22/12 1303  LABPROT 17.9*  INR 1.52*   Radiology: Dg Chest Portable 1 View In Am  01/23/2012  *RADIOLOGY REPORT*  Clinical Data: Postop CABG  PORTABLE CHEST - 1 VIEW  Comparison: 01/22/2012  Findings: Endotracheal tube has been removed.  Swan-Ganz catheter remains in the right pulmonary artery.  Left chest tube in place without pneumothorax.  Decreased lung volume compared with  the prior study due to hypoventilation and increasing bibasilar atelectasis.  Negative for edema.  Small right effusion.  IMPRESSION: Increased bibasilar atelectasis following extubation.   Original Report Authenticated By: Camelia Phenes, M.D.    Dg Chest  Portable 1 View  01/22/2012  *RADIOLOGY REPORT*  Clinical Data: Postop CABG  PORTABLE CHEST - 1 VIEW  Comparison: Chest x-ray of 01/18/2012  Findings: The tip of the endotracheal tube is approximately 4.1 cm above the carina.  A left chest tube is present and no pneumothorax is seen.  Bibasilar linear atelectasis is noted and there is slight elevation of the right hemidiaphragm as noted previously.  Mild cardiomegaly is stable.  Swan-Ganz catheter is unchanged in position.  Neurostimulator electrode leads are present overlying the lower thoracic spinal canal.  No acute bony abnormality is seen.  IMPRESSION:  1.  Endotracheal tip 4.1 cm above the carina. 2.  Bibasilar linear atelectasis. 3.  Left chest tube present.  No pneumothorax.   Original Report Authenticated By: Juline Patch, M.D.      Assessment/Plan: S/P Procedure(s) (LRB): CORONARY ARTERY BYPASS GRAFTING (CABG) (N/A) TRANSESOPHAGEAL ECHOCARDIOGRAM (TEE) (N/A)  1. Doing well, hemodynamically stable 2 low grade temp, + ATX, -push pulm toilet, D/C foley 3 platelets fairly stable, monitor thrombocytopenia, no heparin products 4 takes xanax at home at night - will re-order 5 gentle diuresis 6 poss tx to 2000   GOLD,WAYNE E 01/24/2012 7:24 AM     Chart reviewed, patient examined, agree with above. She is still on 5L oxygen with sats mid 90's. CXR looks worse with marked atelectasis of right lung.  Only doing 250 on IS. She needs to stay in the ICU to work on pulmonary toilet. Continue bronchodilator and add mucomyst. Weight is still 5 kg over preop. Will give IV lasix today.

## 2012-01-24 NOTE — Progress Notes (Signed)
Clinical Social Work Department BRIEF PSYCHOSOCIAL ASSESSMENT 01/24/2012  Patient:  [facility]     Account Number:  [id number]     Admit date:  01/22/2012  Clinical Social Worker:  Madaline Guthrie  Date/Time:  01/24/2012 03:30 PM  Referred by:  Care Management  Date Referred:  01/24/2012 Referred for  SNF Placement   Other Referral:   Interview type:  Patient Other interview type:    PSYCHOSOCIAL DATA Living Status:  ALONE Admitted from facility:   Level of care:   Primary support name:  Rosalyn Charters Primary support relationship to patient:  CHILD, ADULT Degree of support available:   minimal    CURRENT CONCERNS Current Concerns  Post-Acute Placement   Other Concerns:    SOCIAL WORK ASSESSMENT / PLAN CSW met with pt to discuss discharge needs.  Pt states she knows she needs to go to rehab since she lives alone.  She lives in [place] and would like to go to [facility].  CSW educated pt about the process of securing a SNF bed.   Pt expressed understanding and was in agreement with CSW faxing FL2 to [facility]   Assessment/plan status:  Other - See comment Other assessment/ plan:   SNF placement   Information/referral to community resources:    PATIENT'S/FAMILY'S RESPONSE TO PLAN OF CARE: Pt was pleasant and in agreement with SNF placement.

## 2012-01-24 NOTE — Addendum Note (Signed)
Addendum  created 01/24/12 1815 by Kipp Brood, MD   Modules edited:Notes Section

## 2012-01-24 NOTE — Progress Notes (Signed)
POD # 2 CABG x 4  BP 100/38  Pulse 72  Temp 97.8 F (36.6 C) (Oral)  Resp 19  Ht 4' 11.84" (1.52 m)  Wt 147 lb 7.8 oz (66.9 kg)  BMI 28.96 kg/m2  SpO2 97%   Intake/Output Summary (Last 24 hours) at 01/24/12 1734 Last data filed at 01/24/12 1700  Gross per 24 hour  Intake   1444 ml  Output   1745 ml  Net   -301 ml    Continue diuresis  Has done well with pulmonary hygiene today

## 2012-01-24 NOTE — Progress Notes (Signed)
Anesthesiology Follow-up: POD #2  Ms. Yon is awake and alert, neuro intact, hemodynamically stable, walking with assistance.  VS: T- 36.6 BP 99/37 HR 82 (SR) RR 20 O2 Sat 95 on 4L  K-4.1 BUN/Cr 14/0.73 glucose 91 H/H 10.4/30.6 Plts 80,000  CXR shows Bibasilar atelectasis and R. Pleural effusion.   Extubated 5 1/2 hours post-op.  Pt to remain in ICU tonight for pulmonary toilet and diuresis, otherwise doing well so far without apparent complications.  Kipp Brood, MD

## 2012-01-25 ENCOUNTER — Inpatient Hospital Stay (HOSPITAL_COMMUNITY): Payer: Medicare Other

## 2012-01-25 LAB — GLUCOSE, CAPILLARY
Glucose-Capillary: 62 mg/dL — ABNORMAL LOW (ref 70–99)
Glucose-Capillary: 82 mg/dL (ref 70–99)
Glucose-Capillary: 138 mg/dL — ABNORMAL HIGH (ref 70–99)
Glucose-Capillary: 216 mg/dL — ABNORMAL HIGH (ref 70–99)
Glucose-Capillary: 130 mg/dL — ABNORMAL HIGH (ref 70–99)
Glucose-Capillary: 119 mg/dL — ABNORMAL HIGH (ref 70–99)

## 2012-01-25 LAB — BASIC METABOLIC PANEL
BUN: 19 mg/dL (ref 6–23)
Calcium: 5.9 mg/dL — CL (ref 8.4–10.5)
Chloride: 102 mEq/L (ref 96–112)
Creatinine, Ser: 0.74 mg/dL (ref 0.50–1.10)
GFR calc Af Amer: >90 mL/min (ref >90)
GFR calc Af Amer: >90 mL/min (ref >90)
GFR calc non Af Amer: 82 mL/min — ABNORMAL LOW (ref >90)
Glucose, Bld: 148 mg/dL — ABNORMAL HIGH (ref 70–99)
Glucose, Bld: 146 mg/dL — ABNORMAL HIGH (ref 70–99)
Potassium: 2.6 mEq/L — CL (ref 3.5–5.1)
Sodium: 138 mEq/L (ref 135–145)

## 2012-01-25 LAB — CBC
HCT: 29.8 % — ABNORMAL LOW (ref 36.0–46.0)
Hemoglobin: 8.2 g/dL — ABNORMAL LOW (ref 12.0–15.0)
MCH: 31.4 pg (ref 26.0–34.0)
MCH: 31.4 pg (ref 26.0–34.0)
MCHC: 33.2 g/dL (ref 30.0–36.0)
MCHC: 33.6 g/dL (ref 30.0–36.0)
MCV: 93.7 fL (ref 78.0–100.0)
Platelets: 77 10*3/uL — ABNORMAL LOW (ref 150–400)
RDW: 13.9 % (ref 11.5–15.5)

## 2012-01-25 MED ORDER — POTASSIUM CHLORIDE 10 MEQ/50ML IV SOLN
INTRAVENOUS | Status: AC
  Administered 2012-01-25: 10 meq via INTRAVENOUS
  Filled 2012-01-25: qty 50

## 2012-01-25 MED ORDER — INSULIN ASPART 100 UNIT/ML ~~LOC~~ SOLN
0.0000 [IU] | Freq: Three times a day (TID) | SUBCUTANEOUS | Status: DC
  Administered 2012-01-26: 4 [IU] via SUBCUTANEOUS

## 2012-01-25 MED ORDER — POTASSIUM CHLORIDE 10 MEQ/50ML IV SOLN
10.0000 meq | INTRAVENOUS | Status: DC | PRN
  Administered 2012-01-25 (×2): 10 meq via INTRAVENOUS
  Filled 2012-01-25: qty 100

## 2012-01-25 MED FILL — Lidocaine HCl IV Inj 20 MG/ML: INTRAVENOUS | Qty: 5 | Status: AC

## 2012-01-25 MED FILL — Sodium Bicarbonate IV Soln 8.4%: INTRAVENOUS | Qty: 50 | Status: AC

## 2012-01-25 MED FILL — Heparin Sodium (Porcine) Inj 1000 Unit/ML: INTRAMUSCULAR | Qty: 30 | Status: AC

## 2012-01-25 MED FILL — Electrolyte-R (PH 7.4) Solution: INTRAVENOUS | Qty: 4000 | Status: AC

## 2012-01-25 MED FILL — Heparin Sodium (Porcine) Inj 1000 Unit/ML: INTRAMUSCULAR | Qty: 10 | Status: AC

## 2012-01-25 MED FILL — Sodium Chloride IV Soln 0.9%: INTRAVENOUS | Qty: 1000 | Status: AC

## 2012-01-25 MED FILL — Mannitol IV Soln 20%: INTRAVENOUS | Qty: 500 | Status: AC

## 2012-01-25 MED FILL — Sodium Chloride Irrigation Soln 0.9%: Qty: 3000 | Status: AC

## 2012-01-25 NOTE — Progress Notes (Signed)
Clinical Social Work CSW talked to admissions coordinator at [facility] in [place], which is pt's first choice.   They will have a bed on Monday and have accepted pt.

## 2012-01-25 NOTE — Progress Notes (Signed)
3 Days Post-Op Procedure(s) (LRB): CORONARY ARTERY BYPASS GRAFTING (CABG) (N/A) TRANSESOPHAGEAL ECHOCARDIOGRAM (TEE) (N/A) Subjective: No complaints  Objective: Vital signs in last 24 hours: Temp:  [97.6 F (36.4 C)-98.6 F (37 C)] 98.4 F (36.9 C) (10/25 0700) Pulse Rate:  [68-97] 97  (10/25 0700) Cardiac Rhythm:  [-] Normal sinus rhythm (10/25 0400) Resp:  [17-27] 24  (10/25 0700) BP: (80-142)/(32-114) 142/114 mmHg (10/25 0700) SpO2:  [91 %-99 %] 95 % (10/25 0700) Weight:  [66.1 kg (145 lb 11.6 oz)] 66.1 kg (145 lb 11.6 oz) (10/25 0600)  Hemodynamic parameters for last 24 hours:    Intake/Output from previous day: 10/24 0701 - 10/25 0700 In: 1518 [P.O.:900; I.V.:460; IV Piggyback:158] Out: 2145 [Urine:2145] Intake/Output this shift:    General appearance: alert and cooperative Neurologic: intact Heart: regular rate and rhythm, S1, S2 normal, no murmur, click, rub or gallop Lungs: diminished breath sounds LLL and RLL Extremities: extremities normal, atraumatic, no cyanosis or edema Wound: incision ok  Lab Results:  Basename 01/25/12 0420 01/24/12 0400  WBC 7.0 9.9  HGB 8.2* 10.4*  HCT 24.7* 30.6*  PLT 77* 80*   BMET:  Basename 01/25/12 0420 01/24/12 0400  NA 138 136  K 2.6* 4.1  CL 110 105  CO2 20 26  GLUCOSE 148* 85  BUN 16 14  CREATININE 0.67 0.73  CALCIUM 5.9* 7.9*    PT/INR:  Basename 01/22/12 1303  LABPROT 17.9*  INR 1.52*   ABG    Component Value Date/Time   PHART 7.298* 01/22/2012 1912   HCO3 22.3 01/22/2012 1912   TCO2 23 01/23/2012 1850   ACIDBASEDEF 4.0* 01/22/2012 1912   O2SAT 95.0 01/22/2012 1912   CBG (last 3)   Basename 01/25/12 0410 01/25/12 0324 01/24/12 2006  GLUCAP 82 62* 208*   CXR:  Unchanged, marked bilateral lower lobe atelectasis, low lung volumes  Assessment/Plan: S/P Procedure(s) (LRB): CORONARY ARTERY BYPASS GRAFTING (CABG) (N/A) TRANSESOPHAGEAL ECHOCARDIOGRAM (TEE) (N/A)  Continue IS, flutter valve and nebs  with mucomyst. Ambulate I think she needs to stay in ICU for pulmonary toilet DC central line and start peripheral or PICC Her labs are very different this am compared to yesterday with drop in HCT from 30 to 24, K of 2.9, CO2 of 20.  Will repeat.   LOS: 3 days    BARTLE,BRYAN K 01/25/2012

## 2012-01-25 NOTE — Progress Notes (Signed)
CRITICAL VALUE ALERT  Critical value received:  cbg 62  Date of notification:  01/25/12   Time of notification:  0330  Critical value read back: no  Nurse who received alert: Particia Lather RN  MD notified (1st page): followed protocol  Time of first page: followed protocol  MD notified (2nd page):  Time of second page:  Responding MD: n/a  Time MD responded:  n/a

## 2012-01-25 NOTE — Progress Notes (Signed)
cbg obtained 15 mins after prior cbg, value was 82 after cup of juice. Will continue to monitor. Jani Gravel

## 2012-01-25 NOTE — Care Management Note (Signed)
    Page 1 of 1   01/29/2012     4:21:27 PM   CARE MANAGEMENT NOTE 01/29/2012  Patient:  [facility]   Account Number:  [id number]  Date Initiated:  01/22/2012  Documentation initiated by:  MAYO,HENRIETTA  Subjective/Objective Assessment:   78 yr-old female adm with CAD s/p CABG; independent PTA     Action/Plan:   Anticipated DC Date:  01/29/2012   Anticipated DC Plan:  SKILLED NURSING FACILITY      DC Planning Services  CM consult      Choice offered to / List presented to:             Status of service:  Completed, signed off Medicare Important Message given?   (If response is "NO", the following Medicare IM given date fields will be blank) Date Medicare IM given:   Date Additional Medicare IM given:    Discharge Disposition:  SKILLED NURSING FACILITY  Per UR Regulation:  Reviewed for med. necessity/level of care/duration of stay  If discussed at Long Length of Stay Meetings, dates discussed:   01/29/2012    Comments:  PCP:  Dr. Karleen Hampshire Copland  01/29/12 Trystin Hargrove,RN,BSN [phone] PT MEDICALLY STABLE FOR DC TO SNF TODAY, PER CSW ARRANGEMENTS.  01-25-12 3:44pm Avie Arenas, RNBSN [phone] Plan for SNF discharged - SW aware.

## 2012-01-25 NOTE — Progress Notes (Signed)
Patient ID: [name], female   DOB: 12-07-1931, 79 y.o.   MRN: [id number]  SICU Evening Rounds:  Hemodynamically stable  Urine output ok  Repeat labs this am unchanged from yesterday.  Worked hard on pulmonary toilet today.

## 2012-01-26 ENCOUNTER — Inpatient Hospital Stay (HOSPITAL_COMMUNITY): Payer: Medicare Other

## 2012-01-26 LAB — TYPE AND SCREEN
ABO/RH(D): A POS
Antibody Screen: NEGATIVE
Unit division: 0
Unit division: 0
Unit division: 0
Unit division: 0
Unit division: 0
Unit division: 0

## 2012-01-26 LAB — GLUCOSE, CAPILLARY
Glucose-Capillary: 134 mg/dL — ABNORMAL HIGH (ref 70–99)
Glucose-Capillary: 169 mg/dL — ABNORMAL HIGH (ref 70–99)
Glucose-Capillary: 124 mg/dL — ABNORMAL HIGH (ref 70–99)
Glucose-Capillary: 155 mg/dL — ABNORMAL HIGH (ref 70–99)
Glucose-Capillary: 108 mg/dL — ABNORMAL HIGH (ref 70–99)

## 2012-01-26 MED ORDER — BISACODYL 5 MG PO TBEC: 10.0000 mg | DELAYED_RELEASE_TABLET | Freq: Every day | ORAL | Status: DC | PRN

## 2012-01-26 MED ORDER — ASPIRIN EC 325 MG PO TBEC
325.0000 mg | DELAYED_RELEASE_TABLET | Freq: Every day | ORAL | Status: DC
  Administered 2012-01-27 – 2012-01-29 (×3): 325 mg via ORAL
  Filled 2012-01-26 (×4): qty 1

## 2012-01-26 MED ORDER — TRAMADOL HCL 50 MG PO TABS
50.0000 mg | ORAL_TABLET | ORAL | Status: DC | PRN
  Administered 2012-01-28 – 2012-01-29 (×4): 100 mg via ORAL
  Filled 2012-01-26 (×4): qty 2

## 2012-01-26 MED ORDER — BISACODYL 10 MG RE SUPP: 10.0000 mg | Freq: Every day | RECTAL | Status: DC | PRN

## 2012-01-26 MED ORDER — ONDANSETRON HCL 4 MG PO TABS
4.0000 mg | ORAL_TABLET | Freq: Four times a day (QID) | ORAL | Status: DC | PRN
  Administered 2012-01-27: 4 mg via ORAL
  Filled 2012-01-26: qty 1

## 2012-01-26 MED ORDER — FUROSEMIDE 40 MG PO TABS
40.0000 mg | ORAL_TABLET | Freq: Every day | ORAL | Status: DC
  Administered 2012-01-26 – 2012-01-29 (×4): 40 mg via ORAL
  Filled 2012-01-26 (×4): qty 1

## 2012-01-26 MED ORDER — DOCUSATE SODIUM 100 MG PO CAPS
200.0000 mg | ORAL_CAPSULE | Freq: Every day | ORAL | Status: DC
  Administered 2012-01-27 – 2012-01-29 (×3): 200 mg via ORAL
  Filled 2012-01-26 (×3): qty 2

## 2012-01-26 MED ORDER — PANTOPRAZOLE SODIUM 40 MG PO TBEC
40.0000 mg | DELAYED_RELEASE_TABLET | Freq: Every day | ORAL | Status: DC
  Administered 2012-01-27 – 2012-01-29 (×3): 40 mg via ORAL
  Filled 2012-01-26 (×3): qty 1

## 2012-01-26 MED ORDER — SODIUM CHLORIDE 0.9 % IV SOLN: 250.0000 mL | INTRAVENOUS | Status: DC | PRN

## 2012-01-26 MED ORDER — ACETAMINOPHEN 325 MG PO TABS
650.0000 mg | ORAL_TABLET | Freq: Four times a day (QID) | ORAL | Status: DC | PRN
Start: 2012-01-26 — End: 2012-01-29

## 2012-01-26 MED ORDER — ONDANSETRON HCL 4 MG/2ML IJ SOLN
4.0000 mg | Freq: Four times a day (QID) | INTRAMUSCULAR | Status: DC | PRN
  Filled 2012-01-26: qty 2

## 2012-01-26 MED ORDER — METOPROLOL TARTRATE 25 MG PO TABS
25.0000 mg | ORAL_TABLET | Freq: Two times a day (BID) | ORAL | Status: DC
  Administered 2012-01-26 – 2012-01-29 (×6): 25 mg via ORAL
  Filled 2012-01-26 (×9): qty 1

## 2012-01-26 MED ORDER — SODIUM CHLORIDE 0.9 % IJ SOLN
3.0000 mL | Freq: Two times a day (BID) | INTRAMUSCULAR | Status: DC
  Administered 2012-01-26 – 2012-01-28 (×4): 3 mL via INTRAVENOUS

## 2012-01-26 MED ORDER — OXYCODONE HCL 5 MG PO TABS
5.0000 mg | ORAL_TABLET | ORAL | Status: DC | PRN
  Administered 2012-01-26 – 2012-01-29 (×12): 10 mg via ORAL
  Filled 2012-01-26 (×12): qty 2

## 2012-01-26 MED ORDER — MOVING RIGHT ALONG BOOK
Freq: Once | Status: DC
  Filled 2012-01-26: qty 1

## 2012-01-26 MED ORDER — SODIUM CHLORIDE 0.9 % IJ SOLN: 3.0000 mL | INTRAMUSCULAR | Status: DC | PRN

## 2012-01-26 NOTE — Progress Notes (Signed)
4 Days Post-Op Procedure(s) (LRB): CORONARY ARTERY BYPASS GRAFTING (CABG) (N/A) TRANSESOPHAGEAL ECHOCARDIOGRAM (TEE) (N/A) Subjective: Feels weak. Bringing up sputum. Breathing feels better.  Objective: Vital signs in last 24 hours: Temp:  [98 F (36.7 C)-100.1 F (37.8 C)] 98.7 F (37.1 C) (10/26 0800) Pulse Rate:  [70-101] 88  (10/26 0800) Cardiac Rhythm:  [-] Normal sinus rhythm (10/26 0800) Resp:  [19-28] 26  (10/26 0800) BP: (91-129)/(26-92) 122/51 mmHg (10/26 0800) SpO2:  [90 %-99 %] 94 % (10/26 0846) Weight:  [63.776 kg (140 lb 9.6 oz)] 63.776 kg (140 lb 9.6 oz) (10/26 0500)  Hemodynamic parameters for last 24 hours:    Intake/Output from previous day: 10/25 0701 - 10/26 0700 In: 611 [P.O.:600; I.V.:3; IV Piggyback:8] Out: 3325 [Urine:3325] Intake/Output this shift: Total I/O In: 50 [P.O.:50] Out: -   General appearance: alert and cooperative Neurologic: intact Heart: regular rate and rhythm, S1, S2 normal, no murmur, click, rub or gallop Lungs: diminished breath sounds bibasilar Extremities: extremities normal, atraumatic, no cyanosis or edema Wound: incision ok  Lab Results:  Basename 01/25/12 0800 01/25/12 0420  WBC 5.3 7.0  HGB 10.0* 8.2*  HCT 29.8* 24.7*  PLT 80* 77*   BMET:  Basename 01/25/12 0800 01/25/12 0420  NA 134* 138  K 4.5 2.6*  CL 102 110  CO2 26 20  GLUCOSE 146* 148*  BUN 19 16  CREATININE 0.74 0.67  CALCIUM 7.9* 5.9*    PT/INR: No results found for this basename: LABPROT,INR in the last 72 hours ABG    Component Value Date/Time   PHART 7.298* 01/22/2012 1912   HCO3 22.3 01/22/2012 1912   TCO2 23 01/23/2012 1850   ACIDBASEDEF 4.0* 01/22/2012 1912   O2SAT 95.0 01/22/2012 1912   CBG (last 3)   Basename 01/25/12 2219 01/25/12 1707 01/25/12 1114  GLUCAP 119* 130* 216*    Assessment/Plan: S/P Procedure(s) (LRB): CORONARY ARTERY BYPASS GRAFTING (CABG) (N/A) TRANSESOPHAGEAL ECHOCARDIOGRAM (TEE)  (N/A) Mobilize Diuresis Diabetes control Continue bronchodilator, IS, and flutter valve.   LOS: 4 days    BARTLE,BRYAN K 01/26/2012

## 2012-01-27 ENCOUNTER — Inpatient Hospital Stay (HOSPITAL_COMMUNITY): Payer: Medicare Other

## 2012-01-27 LAB — BASIC METABOLIC PANEL
CO2: 29 mEq/L (ref 19–32)
Glucose, Bld: 128 mg/dL — ABNORMAL HIGH (ref 70–99)
Potassium: 2.9 mEq/L — ABNORMAL LOW (ref 3.5–5.1)
Sodium: 139 mEq/L (ref 135–145)

## 2012-01-27 MED ORDER — POTASSIUM CHLORIDE CRYS ER 20 MEQ PO TBCR
EXTENDED_RELEASE_TABLET | ORAL | Status: AC
  Administered 2012-01-27: 40 meq
  Filled 2012-01-27: qty 1

## 2012-01-27 MED ORDER — POTASSIUM CHLORIDE CRYS ER 20 MEQ PO TBCR
40.0000 meq | EXTENDED_RELEASE_TABLET | Freq: Two times a day (BID) | ORAL | Status: AC
  Administered 2012-01-27: 40 meq via ORAL
  Filled 2012-01-27: qty 2

## 2012-01-27 MED ORDER — ALPRAZOLAM 0.25 MG PO TABS
0.2500 mg | ORAL_TABLET | Freq: Two times a day (BID) | ORAL | Status: DC | PRN
  Administered 2012-01-27 (×2): 0.25 mg via ORAL
  Filled 2012-01-27 (×2): qty 1

## 2012-01-27 MED ORDER — RAMIPRIL 10 MG PO CAPS
10.0000 mg | ORAL_CAPSULE | Freq: Every day | ORAL | Status: DC
  Administered 2012-01-27 – 2012-01-29 (×3): 10 mg via ORAL
  Filled 2012-01-27 (×3): qty 1

## 2012-01-27 NOTE — Progress Notes (Addendum)
Patient ambulated twice, each time 500 ft using wheelchair and O2 at 2L with RN assist during day shift. Tolerated well.

## 2012-01-27 NOTE — Progress Notes (Signed)
Patient ambulated approximately 1050 ft pushing wheelchair on 2L of O2 with nursing student.  Patient's gait was steady and walked at moderate pace.  Patient tolerated ambulation well.  Vital signs were stable.  Patient returned to bed. Will continue to monitor

## 2012-01-27 NOTE — Plan of Care (Signed)
Problem: Phase III Progression Outcomes Goal: Time patient transferred to PCTU/Telemetry POD Patient transferred over around 1600

## 2012-01-27 NOTE — Progress Notes (Signed)
Patient ambulated approx. 250 ft. using wheelchair on 4L O2 via n/c led by nursing student;  was eager initially but tired only halfway throughout walk.  Patient's gait was a bit unsteady and needed to sit down in wheelchair. Patient wheeled to room and helped into bed.  Resting comfortably.  Vital signs stable. Will continue to monitor.

## 2012-01-27 NOTE — Progress Notes (Addendum)
[address]            [place] [address]          [phone]     5 Days Post-Op  Procedure(s) (LRB): CORONARY ARTERY BYPASS GRAFTING (CABG) (N/A) TRANSESOPHAGEAL ECHOCARDIOGRAM (TEE) (N/A) Subjective: Feels "weak," SOB at times  Objective  Telemetry sinus rhythm  Temp:  [98.1 F (36.7 C)-99 F (37.2 C)] 99 F (37.2 C) (10/27 0616) Pulse Rate:  [84-98] 98  (10/27 0616) Resp:  [20-39] 22  (10/27 0616) BP: (99-160)/(43-79) 144/79 mmHg (10/27 0616) SpO2:  [92 %-98 %] 93 % (10/27 0616) FiO2 (%):  [0 %] 0 % (10/26 1505) Weight:  [138 lb 6.4 oz (62.778 kg)] 138 lb 6.4 oz (62.778 kg) (10/27 0539)   Intake/Output Summary (Last 24 hours) at 01/27/12 0952 Last data filed at 01/27/12 0800  Gross per 24 hour  Intake    353 ml  Output   1100 ml  Net   -747 ml       General appearance: alert, cooperative and no distress Heart: regular rate and rhythm Lungs: some upper airway coarseness, dim in bases Abdomen: soft, nontender, + BM Extremities: no edema Wound: incisions healing well  Lab Results:  Basename 01/27/12 0730 01/25/12 0800  NA 139 134*  K 2.9* 4.5  CL 101 102  CO2 29 26  GLUCOSE 128* 146*  BUN 16 19  CREATININE 0.64 0.74  CALCIUM 8.5 7.9*  MG -- --  PHOS -- --   No results found for this basename: AST:2,ALT:2,ALKPHOS:2,BILITOT:2,PROT:2,ALBUMIN:2 in the last 72 hours No results found for this basename: LIPASE:2,AMYLASE:2 in the last 72 hours  Basename 01/25/12 0800 01/25/12 0420  WBC 5.3 7.0  NEUTROABS -- --  HGB 10.0* 8.2*  HCT 29.8* 24.7*  MCV 93.7 94.6  PLT 80* 77*   No results found for this basename: CKTOTAL:4,CKMB:4,TROPONINI:4 in the last 72 hours No components found with this basename: POCBNP:3 No results found for this basename: DDIMER in the last 72 hours No results found for this basename: HGBA1C in the last 72 hours No results found for this basename: CHOL,HDL,LDLCALC,TRIG,CHOLHDL in the last 72  hours No results found for this basename: TSH,T4TOTAL,FREET3,T3FREE,THYROIDAB in the last 72 hours No results found for this basename: VITAMINB12,FOLATE,FERRITIN,TIBC,IRON,RETICCTPCT in the last 72 hours  Medications: Scheduled    . acetylcysteine  1 mL Nebulization QID  . aspirin EC  325 mg Oral Daily  . atorvastatin  40 mg Oral Daily  . docusate sodium  200 mg Oral Daily  . furosemide  40 mg Oral Daily  . levalbuterol  0.63 mg Nebulization Q6H  . metoprolol tartrate  25 mg Oral BID  . moving right along book   Does not apply Once  . pantoprazole  40 mg Oral QAC breakfast  . potassium chloride  20 mEq Oral Daily  . sodium chloride  3 mL Intravenous Q12H  . DISCONTD: acetaminophen (TYLENOL) oral liquid 160 mg/5 mL  975 mg Per Tube Q6H  . DISCONTD: acetaminophen  1,000 mg Oral Q6H  . DISCONTD: aspirin  324 mg Per Tube Daily  . DISCONTD: aspirin EC  325 mg Oral Daily  . DISCONTD: bisacodyl  10 mg Oral Daily  . DISCONTD: bisacodyl  10 mg Rectal Daily  . DISCONTD: docusate sodium  200 mg Oral Daily  . DISCONTD: insulin aspart  0-24 Units Subcutaneous TID AC &  HS  . DISCONTD: metoprolol tartrate  12.5 mg Per Tube BID  . DISCONTD: metoprolol tartrate  12.5 mg Oral BID  . DISCONTD: pantoprazole  40 mg Oral Daily  . DISCONTD: sodium chloride  3 mL Intravenous Q12H     Radiology/Studies:  Dg Chest 2 View  01/27/2012  *RADIOLOGY REPORT*  Clinical Data: CABG  CHEST - 2 VIEW  Comparison: Chest radiograph 10/26 1013  Findings: Stable cardiac silhouette.  There are low lung volumes. There is bibasilar atelectasis and small effusion.  Upper lungs are clear.  There is spurring of the spine with kyphosis.  Lumbar fusion noted.  IMPRESSION: Low lung volumes and bibasilar atelectasis and small effusions.  No interval change.   Original Report Authenticated By: Genevive Bi, M.D.    Dg Chest Port 1 View  01/26/2012  *RADIOLOGY REPORT*  Clinical Data: Post coronary bypass grafting  PORTABLE  CHEST - 1 VIEW  Comparison:   the previous day's study  Findings: The right IJ dialysis catheter has been removed.  Low lung volumes.  Bilateral pleural effusions, right greater than left, with associated atelectasis/consolidation in the lung bases. Dorsal stimulator catheter projects over the mid thoracic spine. Previous CABG.  Bilateral shoulder arthroplasty hardware partially seen.  IMPRESSION:  1.  Removal of dialysis catheter.  Otherwise stable appearance since previous exam   Original Report Authenticated By: Osa Craver, M.D.     INR: Will add last result for INR, ABG once components are confirmed Will add last 4 CBG results once components are confirmed  Assessment/Plan: S/P Procedure(s) (LRB): CORONARY ARTERY BYPASS GRAFTING (CABG) (N/A) TRANSESOPHAGEAL ECHOCARDIOGRAM (TEE) (N/A) 1 Doing well, slow but steady recovery. Nurse reports anxious at times will add additional prn xanax 2 cont pulm toilet/ rehab 3 cbg' s fairly well controlled, not on agents preop 4 BP elevated, restart altace 5 replace K+  LOS: 5 days    GOLD,WAYNE E 10/27/20139:52 AM     Chart reviewed, patient examined, agree with above. Her CXR looks about the same with bibasilar atelectasis and effusions. Continue diuretic, IS

## 2012-01-27 NOTE — Progress Notes (Signed)
Patient awaken and wanted to walk.  Patient ambulated approximately 800 ft with minimal assist with nursing student.  Patient's gait was steady using wheelchair.  Tolerated well.  Vital signs were stable.

## 2012-01-28 LAB — BASIC METABOLIC PANEL
GFR calc Af Amer: 76 mL/min — ABNORMAL LOW (ref >90)
GFR calc non Af Amer: 66 mL/min — ABNORMAL LOW (ref >90)
Potassium: 3.9 mEq/L (ref 3.5–5.1)
Sodium: 138 mEq/L (ref 135–145)

## 2012-01-28 LAB — CBC
Hemoglobin: 10.5 g/dL — ABNORMAL LOW (ref 12.0–15.0)
RBC: 3.4 MIL/uL — ABNORMAL LOW (ref 3.87–5.11)

## 2012-01-28 MED ORDER — MAGNESIUM HYDROXIDE 400 MG/5ML PO SUSP
30.0000 mL | Freq: Once | ORAL | Status: AC
  Administered 2012-01-28: 30 mL via ORAL
  Filled 2012-01-28: qty 30

## 2012-01-28 NOTE — Progress Notes (Signed)
Pt ambulated approximately 1200 ft  pushing wheelchair on 2 L O2, tolerated very well, gait steady.  Vitals stable and back to bed without incidence. 

## 2012-01-28 NOTE — Progress Notes (Signed)
CARDIAC REHAB PHASE I   PRE:  Rate/Rhythm: 91SR  BP:  Supine: 120/60  Sitting:   Standing:    SaO2: 94%3L  MODE:  Ambulation: 890 ft   POST:  Rate/Rhythem: 104ST  BP:  Supine:   Sitting: 120/60  Standing:    SaO2: 94%3L 0950-1030 Pt walked 890 ft on 3L with rollator and gait belt use. Tolerated well. Did not need to sit during walk. Pt very motivated to go farther today. Appeared more SOB lying in bed than while walking. Decreased oxygen to 2L after walk. Pt to recliner after walk. Encouraged use of IS.  NT in room to assist with bath.  [name]

## 2012-01-28 NOTE — Progress Notes (Addendum)
6 Days Post-Op Procedure(s) (LRB): CORONARY ARTERY BYPASS GRAFTING (CABG) (N/A) TRANSESOPHAGEAL ECHOCARDIOGRAM (TEE) (N/A) Subjective:  [name] is without complaints this morning.  She continues to have a productive cough with clear sputum production.  She is ambulating and states that her daughter and her feel she would benefit from inpatient rehab +BM  Objective: Vital signs in last 24 hours: Temp:  [97.6 F (36.4 C)-99.1 F (37.3 C)] 97.6 F (36.4 C) (10/28 0427) Pulse Rate:  [78-105] 78  (10/28 0427) Cardiac Rhythm:  [-] Normal sinus rhythm (10/27 1930) Resp:  [20-22] 20  (10/28 0427) BP: (104-149)/(59-89) 104/59 mmHg (10/28 0427) SpO2:  [95 %-98 %] 97 % (10/28 0427) Weight:  [137 lb 9.1 oz (62.4 kg)] 137 lb 9.1 oz (62.4 kg) (10/28 0427)  Intake/Output from previous day: 10/27 0701 - 10/28 0700 In: 3 [I.V.:3] Out: 950 [Urine:950]  General appearance: alert, cooperative and no distress Heart: regular rate and rhythm Lungs: coarse Abdomen: soft, non-tender; bowel sounds normal; no masses,  no organomegaly Extremities: edema no edema present Wound: clean and dr  Lab Results:  Basename 01/28/12 0610 01/25/12 0800  WBC 6.8 5.3  HGB 10.5* 10.0*  HCT 32.2* 29.8*  PLT 194 80*   BMET:  Basename 01/28/12 0610 01/27/12 0730  NA 138 139  K 3.9 2.9*  CL 100 101  CO2 31 29  GLUCOSE 106* 128*  BUN 19 16  CREATININE 0.83 0.64  CALCIUM 8.8 8.5    PT/INR: No results found for this basename: LABPROT,INR in the last 72 hours ABG    Component Value Date/Time   PHART 7.298* 01/22/2012 1912   HCO3 22.3 01/22/2012 1912   TCO2 23 01/23/2012 1850   ACIDBASEDEF 4.0* 01/22/2012 1912   O2SAT 95.0 01/22/2012 1912   CBG (last 3)   Basename 01/26/12 2022 01/26/12 1615 01/26/12 1239  GLUCAP 155* 124* 169*    Assessment/Plan: S/P Procedure(s) (LRB): CORONARY ARTERY BYPASS GRAFTING (CABG) (N/A) TRANSESOPHAGEAL ECHOCARDIOGRAM (TEE) (N/A)  1. CV- NSR on Lopressor, Altace 2.  Pulm- + atelectasis, chest congestion, continue IS/Flutter valve, nebulizer treatments, wean oxygen as tolerated, currently on 4L, patient states she required oxygen at home after last surgery for a few [place]ann 3. Volume Overload- continue Lasix 4. Deconditioning- will benefit from SNF placement 5. Dispo- patient making slow progress, rhythm is stable, will need to optimize lung function prior to discharge   LOS: 6 days    [name], [name] 01/28/2012   Continue work on pulmonary toilet, wean to 2 l o2 could go to snf when on 2 l 02 I have seen and examined [name] and agree with the above assessment  and plan.  Delight Ovens MD Beeper [phone] Office [phone] 01/28/2012 12:36 PM

## 2012-01-28 NOTE — Progress Notes (Signed)
Pt ambulated approximately 1200 ft  pushing wheelchair on 2 L O2, tolerated very well, gait steady.  Vitals stable and back to bed without incidence.

## 2012-01-28 NOTE — Clinical Social Work Note (Signed)
CSW received notification that Pt is planning to DC to SNF once medically cleared. CSW met with Pt who is agreeable and asked CSW to follow up with 2 possible SNFs for her rehab. CSW sent additional clinical information and advised SNFs that Pt will possibly be ready for DC Tues/Wed.    Fl2 updated and ready for MD signature.   Frederico Hamman, LCSW [phone]

## 2012-01-28 NOTE — Clinical Social Work Note (Signed)
SNF bed ready at [facility] when Pt is medically ready for dc.  FL2 in the shadow chart for MD signature.   Thank you,   Frederico Hamman, LCSW [phone]

## 2012-01-29 MED ORDER — ASPIRIN 325 MG PO TBEC: 325.0000 mg | DELAYED_RELEASE_TABLET | Freq: Every day | ORAL | Status: DC

## 2012-01-29 MED ORDER — ALPRAZOLAM 0.25 MG PO TABS: 0.2500 mg | ORAL_TABLET | Freq: Two times a day (BID) | ORAL | Status: DC | PRN

## 2012-01-29 MED ORDER — FUROSEMIDE 40 MG PO TABS: 40.0000 mg | ORAL_TABLET | Freq: Every day | ORAL | Status: DC

## 2012-01-29 MED ORDER — POTASSIUM CHLORIDE ER 10 MEQ PO TBCR: 20.0000 meq | EXTENDED_RELEASE_TABLET | Freq: Every day | ORAL | Status: DC

## 2012-01-29 MED ORDER — MAGNESIUM HYDROXIDE 400 MG/5ML PO SUSP
30.0000 mL | Freq: Once | ORAL | Status: AC
Start: 2012-01-29 — End: 2012-01-29
  Administered 2012-01-29: 30 mL via ORAL
  Filled 2012-01-29: qty 30

## 2012-01-29 MED ORDER — OXYCODONE HCL 5 MG PO TABS: 5.0000 mg | ORAL_TABLET | Freq: Four times a day (QID) | ORAL | Status: DC | PRN

## 2012-01-29 NOTE — Clinical Social Work Placement (Signed)
Clinical Social Work Department CLINICAL SOCIAL WORK PLACEMENT NOTE 01/29/2012  Patient:  [facility]  Account Number:  [id number] Admit date:  01/22/2012  Clinical Social Worker:  Salomon Fick, LCSW  Date/time:  01/25/2012 12:00 M  Clinical Social Work is seeking post-discharge placement for this patient at the following level of care:   SKILLED NURSING   (*CSW will update this form in Epic as items are completed)   01/24/2012  Patient/family provided with [facility] Department of Clinical Social Work's list of facilities offering this level of care within the geographic area requested by the patient (or if unable, by the patient's family).  01/24/2012  Patient/family informed of their freedom to choose among providers that offer the needed level of care, that participate in Medicare, Medicaid or managed care program needed by the patient, have an available bed and are willing to accept the patient.  01/24/2012  Patient/family informed of [facility]' ownership interest in [facility], as well as of the fact that they are under no obligation to receive care at this facility.  PASARR submitted to EDS on 01/25/2012 PASARR number received from [employer] on 01/25/2012  FL2 transmitted to all facilities in geographic area requested by pt/family on  01/24/2012 FL2 transmitted to all facilities within larger geographic area on 01/24/2012  Patient informed that his/her managed care company has contracts with or will negotiate with  certain facilities, including the following:     Patient/family informed of bed offers received:  01/25/2012 Patient chooses bed at [facility] Physician recommends and patient chooses bed at    Patient to be transferred to [facility] on  01/29/2012 Patient to be transferred to facility by [facility]  The following physician request were entered in Epic:   Additional Comments:  DC informations faxed to [employer].

## 2012-01-29 NOTE — Progress Notes (Signed)
Patient did not feel like walking this evening. Will continue to monitor.

## 2012-01-29 NOTE — Progress Notes (Signed)
CTS x 2 DC'd per MD order.  1/2 in steris applied to sites with betadine.  Edges approximated, no bleeding noted.  Will continue to monitor closely. Ave Filter

## 2012-01-29 NOTE — Progress Notes (Signed)
EPW DC'd  With wires intact, site CDI, cleaned with betadine.  Vitals stable, pt on bedrest for 1 hour, will continue to monitor. White, Sharell Hilmer P

## 2012-01-29 NOTE — Progress Notes (Signed)
A and V EPWs DC'd per MD order and protocol.  Wire ends intact, no bleeding or ectopy noted, pt tolerated well.  Sites painted with betadine.  Pt instructed to remain in bed x 1 hr, frequent vital signs initiated.  Will continue to monitor closely. Ave Filter

## 2012-01-29 NOTE — Progress Notes (Signed)
[id number] Education completed with pt. Permission given to refer to [facility] Phase 2. Briston Lax DunlapRN

## 2012-01-29 NOTE — Discharge Summary (Signed)
[address]            [place] [address]          [phone]      [name] 02-Feb-1932 78 y.o. [id number]  01/22/2012   Delight Ovens, MD  CAD   HPI: Patient is a 78 year old female with known coronary occlusive disease who is referred by Dr. Riley Kill to consider coronary artery bypass grafting. She comes to the office as an outpatient after recent cardiac catheterization. She notes several years of intermittent chest discomfort radiating into her shoulders and with shortness of breath. The discomfort is not clearly exertionally related. She notes an admission to [facility] in August 2012 and was treated medically. Cardiac catheterization noted severe three-vessel coronary artery disease including high-grade proximal LAD stenosis, 70% distal circumflex, and total occlusion of the right coronary artery. She was referred to Sheliah Plane M.D. who evaluated the patient and her studies and agreed with recommendations to proceed with surgical coronary artery revascularization. She was admitted this hospitalization for the procedure.  Past Medical History   Diagnosis  Date   .  Hypertension  08/01/1995   .  Rheumatoid arthritis  04/03/1983   .  Osteoarthritis  04/03/1983   .  Anemia, deficiency  07/01/2004     (w/u by Dr. Lorre Nick)   .  Diabetes mellitus type II  07/31/1997   .  Osteoporosis  12/31/2000   .  CAD (coronary artery disease)  09/01/2002     s/p NSTEMI 2012   .  Hyperlipemia  07/02/1999   .  COPD (chronic obstructive pulmonary disease)  05/31/2004   .  diverticulosis of colon    .  Anxiety and depression    .  Arteriovenous malformation of gastrointestinal tract    .  Fatty liver    .  Chronic back pain    .  History of non-ST elevation myocardial infarction (NSTEMI)  07/22/2011   .  Splenic artery aneurysm 1.6 cm in size 2010     Past Surgical History   Procedure  Date   .  Nasal sinus surgery    .  Inguinal hernia  repair    .  Partial hysterectomy      ovaries intact, dysmennorhea w/ A/P repari   .  Carpal tunnel release      bilat   .  Trigger finger release      right   .  Wrist surgery      neuroma repair, right   .  Ankle surgery      neuroma repair, right ORIF   .  Total shoulder replacement  12/13/1998     right, ([facility])   .  Rotator cuff repair  11/22/2003     ([facility]) left   .  Epidural block injection  05/2005     x 3   .  Long finger tendon realignment  10/31/2005     [facility]   .  Lumbar epidural  06/13/2006   .  Laminectomy  06/2006     L4/5 spinal stenosis (Dr. [name])   .  Laminectomy  02/24/2008     L3/4 Ant/Lat interbody fusion and Lat Artthrodesis w/plate using XLIF (Dr. [name])   .  Colonic embolization  04/18/2011     Dr. Wyn Quaker, acute GI bleed    Family History   Problem  Relation  Age of Onset   .  Hypotension  Mother    .  Dementia  Mother    .  Alcohol abuse  Father    .  Heart disease  Father       MI, enlarged heart    .  Diabetes  Brother     History    Social History   .  Marital Status:  Widowed     Spouse Name:  N/A     Number of Children:  4   .  Years of Education:  N/A    Occupational History   .  [employer]      9 hour days-6 days/week    Social History Main Topics   .  Smoking status:  Current Some Day Smoker -- 62 years     Types:  Cigarettes   .  Smokeless tobacco:  Never Used     Comment: Smokes a few puffs occasionally    .  Alcohol Use:  Yes      red wine occassionally   .  Drug Use:  No   .  Sexually Active:  No    Other Topics  Concern   .  Not on file    Social History Narrative    Married, widow 1972    History   Smoking status   .  Current Some Day Smoker -- 62 years   .  Types:  Cigarettes   Smokeless tobacco   .  Never Used    Comment: Smokes a few puffs occasionally    History   Alcohol Use   .  Yes     red wine occassionally    Allergies   Allergen  Reactions   .  Ciprofloxacin      REACTION: UNSPECIFIED     .  Iron  Diarrhea   .  Morphine Sulfate  Nausea And Vomiting     At high doses   .  Naproxen  Rash     REACTION: UNSPECIFIED    Current Outpatient Prescriptions   Medication  Sig  Dispense  Refill   .  ALPRAZolam (XANAX) 0.5 MG tablet  Take 0.5 mg by mouth at bedtime as needed. For sleep     .  amLODipine (NORVASC) 5 MG tablet  Take 1 tablet (5 mg total) by mouth daily.  30 tablet  6   .  aspirin EC 81 MG tablet  Take 1 tablet (81 mg total) by mouth daily.  150 tablet  2   .  atorvastatin (LIPITOR) 40 MG tablet  Take 1 tablet (40 mg total) by mouth daily.     .  fish oil-omega-3 fatty acids 1000 MG capsule  Take 1 g by mouth daily.     [name]  guaiFENesin (MUCINEX) 600 MG 12 hr tablet  Take 600 mg by mouth 2 (two) times daily.     .  isosorbide mononitrate (IMDUR) 30 MG 24 hr tablet  Take 1 tablet (30 mg total) by mouth daily.  30 tablet  6   .  metoprolol tartrate (LOPRESSOR) 25 MG tablet  Take 25 mg by mouth 2 (two) times daily.     .  Multiple Vitamins-Minerals (CENTRUM PO)  Take 1 tablet by mouth daily.     .  nitroGLYCERIN (NITROSTAT) 0.4 MG SL tablet  Place 0.4 mg under the tongue every 5 (five) minutes as needed. For chest pain     .  oxyCODONE-acetaminophen (PERCOCET) 10-325 MG per  tablet  Take 1 tablet by mouth every 6 (six) hours as needed. For pain     .  POTASSIUM GLUCONATE PO  Take 1 capsule by mouth daily.     .  Probiotic Product (HEALTHY COLON PO)  Take 1 capsule by mouth daily.     .  ramipril (ALTACE) 10 MG capsule  Take 1 capsule (10 mg total) by mouth daily.  30 capsule  11   .  Saline (OCEAN NASAL SPRAY NA)  Place 1-2 sprays into the nose daily as needed. For dry nasal passages     .  DISCONTD: metoprolol tartrate (LOPRESSOR) 50 MG tablet  Take 1 tablet (50 mg total) by mouth 2 (two) times daily.  60 tablet  11        Hospital Course:  The patient was admitted to the hospital and taken to the operating room on 01/22/2012 and underwent Procedure(s): DATE OF PROCEDURE:  01/22/2012  DATE OF DISCHARGE:  OPERATIVE REPORT  PREOPERATIVE DIAGNOSIS: Coronary occlusive disease with unstable  angina.  POSTOPERATIVE DIAGNOSIS: Coronary occlusive disease with unstable  angina.  SURGICAL PROCEDURE: Coronary artery bypass grafting x4 with the left  internal mammary to the left anterior descending coronary artery,  reverse saphenous vein graft to the diagonal coronary artery, reverse  saphenous vein graft to the distal circumflex and reverse saphenous vein  graft to the distal right coronary artery with left leg endo vein  harvesting.  SURGEON: Sheliah Plane, MD  FIRST ASSISTANT: Coral Ceo, PA  The patient tolerated the procedure without obvious  complication and was transferred to Surgical Intensive Care Unit for  further postop care. Total pump time was 101 minutes. The patient had  preoperative anemia and so because of low hematocrit while on bypass,  required 2 units of packed red blood cells.   Post operative hospital course:  The patient has remained hemodynamically stable. She has had no significant cardiac dysrhythmias. All routine lines, monitors and drainage devices have been discontinued in the standard fashion. She was weaned from the ventilator initially without difficulty but has required aggressive pulmonary management. She showed steady improvement in this regard. She does have an expected acute blood loss anemia and values have stabilized. Incisions are healing well without evidence of infection. She does have a moderate postoperative volume overload which is improved with time using gentle diuretic therapy. She has small pleural effusions and will continue diuretics post hospitalization for an additional week. She is tolerating gradually increasing activities using standard postoperative modalities. It is felt that she would benefit from short-term skilled nursing facility stay to continue her postoperative rehabilitation. Tentatively she is felt to  be stable for discharge soon pending ongoing reevaluation of her recovery.     Basename 01/28/12 0610 01/27/12 0730  NA 138 139  K 3.9 2.9*  CL 100 101  CO2 31 29  GLUCOSE 106* 128*  BUN 19 16  CALCIUM 8.8 8.5    Basename 01/28/12 0610  WBC 6.8  HGB 10.5*  HCT 32.2*  PLT 194   No results found for this basename: INR:2 in the last 72 hours   Discharge Instructions:  The patient is discharged to home with extensive instructions on wound care and progressive ambulation.  They are instructed not to drive or perform any heavy lifting until returning to see the physician in his office.  Discharge Diagnosis:  CAD  Secondary Diagnosis: Patient Active Problem List  Diagnosis  . DIABETES MELLITUS, TYPE II  . HYPERLIPIDEMIA  .  ANEMIA-IRON DEFICIENCY  . ANXIETY DEPRESSION  . INSOMNIA, CHRONIC  . HYPERTENSION  . CAD (coronary artery disease)  . COPD  . DIVERTICULOSIS OF COLON  . FATTY LIVER DISEASE  . ECZEMA  . Rheumatoid arthritis  . OSTEOARTHRITIS  . LEG PAIN, BILATERAL  . OSTEOPOROSIS  . ARTERIOVENOUS MALFORMATION, GASTRIC  . AAA (abdominal aortic aneurysm)  . Smoking  . GIB (gastrointestinal bleeding)  . History of non-ST elevation myocardial infarction (NSTEMI)  . Urinary tract infection  . Splenic artery aneurysm   Past Medical History  Diagnosis Date  . Hypertension 08/01/1995  . Rheumatoid arthritis 04/03/1983  . Osteoarthritis 04/03/1983  . Anemia, deficiency 07/01/2004    (w/u by Dr. Lorre Nick)  . Diabetes mellitus type II 07/31/1997  . Osteoporosis 12/31/2000  . CAD (coronary artery disease) 09/01/2002    s/p NSTEMI 2012  . Hyperlipemia 07/02/1999  . COPD (chronic obstructive pulmonary disease) 05/31/2004  . diverticulosis of colon   . Arteriovenous malformation of gastrointestinal tract   . Fatty liver   . Chronic back pain   . History of non-ST elevation myocardial infarction (NSTEMI) 07/22/2011  . Splenic artery aneurysm   . Anxiety and  depression   . Anxiety        Genice, Kimberlin  Home Medication Instructions [id number]   Printed on:01/29/12 0908  Medication Information                    Saline (OCEAN NASAL SPRAY NA) Place 1-2 sprays into the nose daily as needed. For dry nasal passages           Probiotic Product (HEALTHY COLON PO) Take 1 capsule by mouth daily.            guaiFENesin (MUCINEX) 600 MG 12 hr tablet Take 600 mg by mouth 2 (two) times daily.            metoprolol tartrate (LOPRESSOR) 25 MG tablet Take 25 mg by mouth 2 (two) times daily.             ramipril (ALTACE) 10 MG capsule Take 1 capsule (10 mg total) by mouth daily.           atorvastatin (LIPITOR) 40 MG tablet Take 1 tablet (40 mg total) by mouth daily.           fish oil-omega-3 fatty acids 1000 MG capsule Take 1 g by mouth daily.           ALPRAZolam (XANAX) 0.5 MG tablet Take 0.5 mg by mouth at bedtime as needed. For sleep           Multiple Vitamin (MULTIVITAMIN WITH MINERALS) TABS Take 1 tablet by mouth daily.           oxyCODONE (OXYCONTIN) 20 MG 12 hr tablet Take 20 mg by mouth every 6 (six) hours.           ALPRAZolam (XANAX) 0.25 MG tablet Take 1 tablet (0.25 mg total) by mouth 2 (two) times daily as needed for anxiety.           aspirin EC 325 MG EC tablet Take 1 tablet (325 mg total) by mouth daily.           oxyCODONE (OXY IR/ROXICODONE) 5 MG immediate release tablet Take 1-2 tablets (5-10 mg total) by mouth every 6 (six) hours as needed.           furosemide (LASIX) 40 MG tablet Take 1 tablet (40 mg total) by mouth  daily.           potassium chloride (K-DUR) 10 MEQ tablet Take 2 tablets (20 mEq total) by mouth daily.            Follow-up Information    Follow up with GERHARDT,EDWARD B, MD. (02/21/2012 at 4:30 PM. Please obtain a chest x-ray at [facility] imaging at 3:30 PM. [facility] imaging is located in the same office complex.)    Contact information:   [address] [place] [address] [phone]       Follow up with Marca Ancona, MD. ([facility]-please contact the office to arrange this appointment)    Contact information:   [address] [place] [address] [phone]         Notation: Patient is on beta blocker, ACE inhibitor and statin. Disposition: To skilled nursing facility  Patient's condition is Good  Gershon Crane, PA-C 01/29/2012  9:08 AM

## 2012-01-29 NOTE — Progress Notes (Signed)
Pt ambulated approximately 550 ft on 2 L O2 pushing a wheelchair, pt gait was steady and pace was moderate.  Pt tolerated ambulation well.  Pt back to bed without incidence, will continue to monitor. White, Javion Holmer P

## 2012-01-29 NOTE — Progress Notes (Signed)
Pt for discharge to [facility]) today, IV D/C, EPW D/C, and chest tube sutures removed, all sites CDI.  D/C instructions given with verbalized understanding.   Staff to assist pt discharge via stretcher/ambulance.

## 2012-01-29 NOTE — Progress Notes (Addendum)
CARDIAC REHAB PHASE I   PRE:  Rate/Rhythm: 82SR  BP:  Supine:   Sitting: 118/43  Standing:    SaO2: 93%2L, 85%RA, 87-89% 2L  MODE:  Ambulation: bathroom ft   POST:  Rate/Rhythem:   BP:  Supine:   Sitting:   Standing:    SaO2:  [id number] Came to walk pt. When pt stood, she was sitting in stool. Took pt to bathroom. Odor so strong that pt started vomiting in waste basket. Helped pt get cleaned up along with her RN. Pt desat on RA so had to put back on 2L. Sat pt on side of bed and getting ready to walk. Pt needed BSC quickly. Will return for walk.  Duanne Limerick  [phone]  Pt ready to walk. Ambulated 350 ft on 2L with rolling walker and asst x 1. Pt c/o legs and hips hurting with short walk. Also started to get nauseated. To recliner with call bell and basin. 91SR, 122/56, and 91% 2l. Do not feel that pt ready for d/c as she was able to walk 890 ft yesterday and is having frequent BMs and n/v.

## 2012-01-29 NOTE — Progress Notes (Signed)
                    [address]            [place] [address]          [phone]     7 Days Post-Op Procedure(s) (LRB): CORONARY ARTERY BYPASS GRAFTING (CABG) (N/A) TRANSESOPHAGEAL ECHOCARDIOGRAM (TEE) (N/A)  Subjective: Feels well.  Still with some cough, but now stable on 2L O2. +small BM, but still feels like she needs a laxative.   Objective: Vital signs in last 24 hours: Patient Vitals for the past 24 hrs:  BP Temp Temp src Pulse Resp SpO2 Weight  01/29/12 0438 126/60 mmHg 97.5 F (36.4 C) Oral 96  16  96 % 138 lb 0.1 oz (62.6 kg)  01/29/12 0300 - 99 F (37.2 C) Oral - - - -  01/29/12 0244 - - - - - 96 % -  01/28/12 2100 - - - - - 94 % -  01/28/12 1946 119/68 mmHg 99.3 F (37.4 C) Oral 102  20  93 % -  01/28/12 1401 125/51 mmHg 98.7 F (37.1 C) Oral 91  16  95 % -  01/28/12 0821 - - - - - 97 % -   Current Weight  01/29/12 138 lb 0.1 oz (62.6 kg)   PRE-OPERATIVE WEIGHT: 62 kg   Intake/Output from previous day: 10/28 0701 - 10/29 0700 In: 243 [P.O.:240; I.V.:3] Out: 1151 [Urine:1150; Stool:1]    PHYSICAL EXAM:  Heart: RRR Lungs: Exp wheezes throughout Wound: Clean and dry Extremities: No LE edema    Lab Results: CBC: Basename 01/28/12 0610  WBC 6.8  HGB 10.5*  HCT 32.2*  PLT 194   BMET:  Basename 01/28/12 0610 01/27/12 0730  NA 138 139  K 3.9 2.9*  CL 100 101  CO2 31 29  GLUCOSE 106* 128*  BUN 19 16  CREATININE 0.83 0.64  CALCIUM 8.8 8.5    PT/INR: No results found for this basename: LABPROT,INR in the last 72 hours    Assessment/Plan: S/P Procedure(s) (LRB): CORONARY ARTERY BYPASS GRAFTING (CABG) (N/A) TRANSESOPHAGEAL ECHOCARDIOGRAM (TEE) (N/A) CV- stable, BPs controlled.  On ACE-I, beta blocker, statin. Pulm- weaned to 2 L.  Continue IS/nebs. Vol overload- back to pre-op wt and no significant edema. Continue short course of Lasix. D/c EPWs, CT sutures. To SNF ?later today vs in am.   LOS: 7 days     COLLINS,GINA H 01/29/2012

## 2012-02-02 ENCOUNTER — Encounter (HOSPITAL_COMMUNITY): Payer: Self-pay | Admitting: *Deleted

## 2012-02-02 ENCOUNTER — Inpatient Hospital Stay (HOSPITAL_COMMUNITY): Payer: Medicare Other

## 2012-02-02 ENCOUNTER — Inpatient Hospital Stay (HOSPITAL_COMMUNITY)
Admission: EM | Admit: 2012-02-02 | Discharge: 2012-02-07 | DRG: 189 | Disposition: A | Discharge status: Home Health | Payer: Medicare Other | Source: Other Acute Inpatient Hospital | Attending: Cardiology | Admitting: Cardiology

## 2012-02-02 ENCOUNTER — Emergency Department: Payer: Self-pay | Admitting: Emergency Medicine

## 2012-02-02 DIAGNOSIS — J984 Other disorders of lung: Secondary | ICD-10-CM

## 2012-02-02 DIAGNOSIS — M069 Rheumatoid arthritis, unspecified: Secondary | ICD-10-CM | POA: Diagnosis present

## 2012-02-02 DIAGNOSIS — J441 Chronic obstructive pulmonary disease with (acute) exacerbation: Secondary | ICD-10-CM | POA: Diagnosis present

## 2012-02-02 DIAGNOSIS — Z8249 Family history of ischemic heart disease and other diseases of the circulatory system: Secondary | ICD-10-CM

## 2012-02-02 DIAGNOSIS — I509 Heart failure, unspecified: Secondary | ICD-10-CM | POA: Diagnosis present

## 2012-02-02 DIAGNOSIS — N390 Urinary tract infection, site not specified: Secondary | ICD-10-CM | POA: Diagnosis present

## 2012-02-02 DIAGNOSIS — I252 Old myocardial infarction: Secondary | ICD-10-CM

## 2012-02-02 DIAGNOSIS — M810 Age-related osteoporosis without current pathological fracture: Secondary | ICD-10-CM | POA: Diagnosis present

## 2012-02-02 DIAGNOSIS — K7689 Other specified diseases of liver: Secondary | ICD-10-CM | POA: Diagnosis present

## 2012-02-02 DIAGNOSIS — D649 Anemia, unspecified: Secondary | ICD-10-CM | POA: Diagnosis present

## 2012-02-02 DIAGNOSIS — F341 Dysthymic disorder: Secondary | ICD-10-CM | POA: Diagnosis present

## 2012-02-02 DIAGNOSIS — I10 Essential (primary) hypertension: Secondary | ICD-10-CM | POA: Diagnosis present

## 2012-02-02 DIAGNOSIS — G2581 Restless legs syndrome: Secondary | ICD-10-CM | POA: Diagnosis present

## 2012-02-02 DIAGNOSIS — G8929 Other chronic pain: Secondary | ICD-10-CM | POA: Diagnosis present

## 2012-02-02 DIAGNOSIS — Z881 Allergy status to other antibiotic agents status: Secondary | ICD-10-CM

## 2012-02-02 DIAGNOSIS — Z888 Allergy status to other drugs, medicaments and biological substances status: Secondary | ICD-10-CM

## 2012-02-02 DIAGNOSIS — E119 Type 2 diabetes mellitus without complications: Secondary | ICD-10-CM | POA: Diagnosis present

## 2012-02-02 DIAGNOSIS — F17200 Nicotine dependence, unspecified, uncomplicated: Secondary | ICD-10-CM | POA: Diagnosis present

## 2012-02-02 DIAGNOSIS — Z79899 Other long term (current) drug therapy: Secondary | ICD-10-CM

## 2012-02-02 DIAGNOSIS — I5033 Acute on chronic diastolic (congestive) heart failure: Secondary | ICD-10-CM

## 2012-02-02 DIAGNOSIS — J189 Pneumonia, unspecified organism: Secondary | ICD-10-CM

## 2012-02-02 DIAGNOSIS — Z951 Presence of aortocoronary bypass graft: Secondary | ICD-10-CM

## 2012-02-02 DIAGNOSIS — J449 Chronic obstructive pulmonary disease, unspecified: Secondary | ICD-10-CM

## 2012-02-02 DIAGNOSIS — M549 Dorsalgia, unspecified: Secondary | ICD-10-CM | POA: Diagnosis present

## 2012-02-02 DIAGNOSIS — Z833 Family history of diabetes mellitus: Secondary | ICD-10-CM

## 2012-02-02 DIAGNOSIS — I2510 Atherosclerotic heart disease of native coronary artery without angina pectoris: Secondary | ICD-10-CM | POA: Diagnosis present

## 2012-02-02 DIAGNOSIS — IMO0001 Reserved for inherently not codable concepts without codable children: Secondary | ICD-10-CM | POA: Diagnosis present

## 2012-02-02 DIAGNOSIS — I5032 Chronic diastolic (congestive) heart failure: Secondary | ICD-10-CM

## 2012-02-02 DIAGNOSIS — Z7982 Long term (current) use of aspirin: Secondary | ICD-10-CM

## 2012-02-02 DIAGNOSIS — K5730 Diverticulosis of large intestine without perforation or abscess without bleeding: Secondary | ICD-10-CM | POA: Diagnosis present

## 2012-02-02 DIAGNOSIS — J9600 Acute respiratory failure, unspecified whether with hypoxia or hypercapnia: Principal | ICD-10-CM

## 2012-02-02 DIAGNOSIS — E785 Hyperlipidemia, unspecified: Secondary | ICD-10-CM | POA: Diagnosis present

## 2012-02-02 HISTORY — DX: Enterocolitis due to Clostridium difficile, not specified as recurrent: A04.72

## 2012-02-02 HISTORY — DX: Restless legs syndrome: G25.81

## 2012-02-02 HISTORY — DX: Gastrointestinal hemorrhage, unspecified: K92.2

## 2012-02-02 HISTORY — DX: Other specified disorders of adrenal gland: E27.8

## 2012-02-02 LAB — CBC WITH DIFFERENTIAL/PLATELET
Basophil #: 0.1 x10 3/mm 3
Basophil %: 0.5 %
Basophils Absolute: 0 10*3/uL (ref 0.0–0.1)
Basophils Relative: 0 % (ref 0–1)
Eosinophil #: 0.6 x10 3/mm 3
Eosinophil %: 4.4 %
Eosinophils Absolute: 0.4 10*3/uL (ref 0.0–0.7)
HCT: 30.6 % — ABNORMAL LOW
HGB: 10.4 g/dL — ABNORMAL LOW
Hemoglobin: 9.7 g/dL — ABNORMAL LOW (ref 12.0–15.0)
Lymphocyte %: 12.4 %
Lymphs Abs: 1.6 x10 3/mm 3
MCH: 31.6 pg
MCH: 30.5 pg (ref 26.0–34.0)
MCHC: 34.2 g/dL
MCHC: 32.6 g/dL (ref 30.0–36.0)
MCV: 93 fL
Monocyte #: 0.8 "x10 3/mm "
Monocyte %: 6.2 %
Neutro Abs: 8 10*3/uL — ABNORMAL HIGH (ref 1.7–7.7)
Neutrophil #: 9.9 x10 3/mm 3 — ABNORMAL HIGH
Neutrophil %: 76.5 %
Neutrophils Relative %: 74 % (ref 43–77)
Platelet: 364 x10 3/mm 3
Platelets: 336 10*3/uL (ref 150–400)
RBC: 3.3 X10 6/mm 3 — ABNORMAL LOW
RDW: 13.6 %
RDW: 13 % (ref 11.5–15.5)
WBC: 13 x10 3/mm 3 — ABNORMAL HIGH

## 2012-02-02 LAB — BASIC METABOLIC PANEL
BUN: 18 mg/dL (ref 6–23)
Calcium: 9 mg/dL (ref 8.4–10.5)
Creatinine, Ser: 0.8 mg/dL (ref 0.50–1.10)
GFR calc Af Amer: 80 mL/min — ABNORMAL LOW (ref >90)
GFR calc non Af Amer: 69 mL/min — ABNORMAL LOW (ref >90)
Glucose, Bld: 101 mg/dL — ABNORMAL HIGH (ref 70–99)
Potassium: 3.7 mEq/L (ref 3.5–5.1)

## 2012-02-02 LAB — URINALYSIS, COMPLETE
Glucose,UR: NEGATIVE mg/dL (ref 0–75)
Nitrite: NEGATIVE
Ph: 5 (ref 4.5–8.0)
Protein: NEGATIVE
RBC,UR: 3 /HPF (ref 0–5)
Squamous Epithelial: 1

## 2012-02-02 LAB — COMPREHENSIVE METABOLIC PANEL WITH GFR
Albumin: 2.8 g/dL — ABNORMAL LOW
Alkaline Phosphatase: 85 U/L
Anion Gap: 7
BUN: 20 mg/dL — ABNORMAL HIGH
Bilirubin,Total: 0.3 mg/dL
Calcium, Total: 8.8 mg/dL
Chloride: 99 mmol/L
Co2: 33 mmol/L — ABNORMAL HIGH
Creatinine: 0.72 mg/dL
EGFR (African American): >60
EGFR (Non-African Amer.): >60
Glucose: 144 mg/dL — ABNORMAL HIGH
Osmolality: 283
Potassium: 4.1 mmol/L
SGOT(AST): 23 U/L
SGPT (ALT): 23 U/L
Sodium: 139 mmol/L
Total Protein: 7.3 g/dL

## 2012-02-02 LAB — EXPECTORATED SPUTUM ASSESSMENT W GRAM STAIN, RFLX TO RESP C

## 2012-02-02 LAB — URINALYSIS, ROUTINE W REFLEX MICROSCOPIC
Bilirubin Urine: NEGATIVE
Glucose, UA: NEGATIVE mg/dL
Hgb urine dipstick: NEGATIVE
Protein, ur: NEGATIVE mg/dL
Specific Gravity, Urine: 1.019 (ref 1.005–1.030)
Urobilinogen, UA: 1 mg/dL (ref 0.0–1.0)

## 2012-02-02 LAB — URINE MICROSCOPIC-ADD ON

## 2012-02-02 LAB — TROPONIN I: Troponin-I: 0.04 ng/mL

## 2012-02-02 LAB — MRSA PCR SCREENING: MRSA by PCR: NEGATIVE

## 2012-02-02 LAB — PRO B NATRIURETIC PEPTIDE: B-Type Natriuretic Peptide: 708 pg/mL — ABNORMAL HIGH

## 2012-02-02 MED ORDER — FUROSEMIDE 40 MG PO TABS
40.0000 mg | ORAL_TABLET | Freq: Every day | ORAL | Status: DC
  Administered 2012-02-02: 40 mg via ORAL
  Filled 2012-02-02 (×2): qty 1

## 2012-02-02 MED ORDER — LEVOFLOXACIN IN D5W 500 MG/100ML IV SOLN
500.0000 mg | INTRAVENOUS | Status: DC
  Administered 2012-02-03: 500 mg via INTRAVENOUS
  Filled 2012-02-02 (×2): qty 100

## 2012-02-02 MED ORDER — ONDANSETRON HCL 4 MG PO TABS: 4.0000 mg | ORAL_TABLET | Freq: Four times a day (QID) | ORAL | Status: DC | PRN

## 2012-02-02 MED ORDER — ALPRAZOLAM 0.25 MG PO TABS
0.2500 mg | ORAL_TABLET | Freq: Two times a day (BID) | ORAL | Status: DC | PRN
  Administered 2012-02-02 – 2012-02-06 (×5): 0.25 mg via ORAL
  Filled 2012-02-02 (×5): qty 1

## 2012-02-02 MED ORDER — OXYCODONE HCL 20 MG PO TB12
20.0000 mg | ORAL_TABLET | Freq: Two times a day (BID) | ORAL | Status: DC
  Filled 2012-02-02 (×2): qty 1

## 2012-02-02 MED ORDER — DEXTROSE 5 % IV SOLN
2.0000 g | INTRAVENOUS | Status: DC
  Administered 2012-02-02 – 2012-02-03 (×2): 2 g via INTRAVENOUS
  Filled 2012-02-02 (×3): qty 2

## 2012-02-02 MED ORDER — HEPARIN SODIUM (PORCINE) 5000 UNIT/ML IJ SOLN
5000.0000 [IU] | Freq: Three times a day (TID) | INTRAMUSCULAR | Status: DC
  Administered 2012-02-02 – 2012-02-07 (×14): 5000 [IU] via SUBCUTANEOUS
  Filled 2012-02-02 (×18): qty 1

## 2012-02-02 MED ORDER — IPRATROPIUM BROMIDE 0.02 % IN SOLN
0.5000 mg | Freq: Four times a day (QID) | RESPIRATORY_TRACT | Status: DC
  Administered 2012-02-02 – 2012-02-07 (×19): 0.5 mg via RESPIRATORY_TRACT
  Filled 2012-02-02 (×21): qty 2.5

## 2012-02-02 MED ORDER — SALINE NASAL SPRAY 0.65 % NA SOLN
1.0000 | NASAL | Status: DC | PRN
  Filled 2012-02-02: qty 15

## 2012-02-02 MED ORDER — ADULT MULTIVITAMIN W/MINERALS CH
1.0000 | ORAL_TABLET | Freq: Every day | ORAL | Status: DC
  Administered 2012-02-02 – 2012-02-07 (×6): 1 via ORAL
  Filled 2012-02-02 (×6): qty 1

## 2012-02-02 MED ORDER — SODIUM CHLORIDE 0.9 % IJ SOLN
3.0000 mL | Freq: Two times a day (BID) | INTRAMUSCULAR | Status: DC
  Administered 2012-02-02 – 2012-02-07 (×8): 3 mL via INTRAVENOUS

## 2012-02-02 MED ORDER — OXYCODONE HCL ER 10 MG PO T12A
20.0000 mg | EXTENDED_RELEASE_TABLET | Freq: Two times a day (BID) | ORAL | Status: DC
  Administered 2012-02-02 – 2012-02-07 (×10): 20 mg via ORAL
  Filled 2012-02-02: qty 1
  Filled 2012-02-02 (×3): qty 2
  Filled 2012-02-02: qty 1
  Filled 2012-02-02 (×6): qty 2

## 2012-02-02 MED ORDER — LEVOFLOXACIN IN D5W 750 MG/150ML IV SOLN: 750.0000 mg | INTRAVENOUS | Status: DC

## 2012-02-02 MED ORDER — OXYCODONE HCL 5 MG PO TABS
5.0000 mg | ORAL_TABLET | Freq: Four times a day (QID) | ORAL | Status: DC | PRN
  Administered 2012-02-02 – 2012-02-07 (×15): 10 mg via ORAL
  Filled 2012-02-02 (×15): qty 2

## 2012-02-02 MED ORDER — LEVOFLOXACIN IN D5W 500 MG/100ML IV SOLN: 500.0000 mg | INTRAVENOUS | Status: DC

## 2012-02-02 MED ORDER — ACETAMINOPHEN 650 MG RE SUPP: 650.0000 mg | Freq: Four times a day (QID) | RECTAL | Status: DC | PRN

## 2012-02-02 MED ORDER — ALBUTEROL SULFATE (5 MG/ML) 0.5% IN NEBU
2.5000 mg | INHALATION_SOLUTION | Freq: Four times a day (QID) | RESPIRATORY_TRACT | Status: DC
  Administered 2012-02-02 – 2012-02-07 (×19): 2.5 mg via RESPIRATORY_TRACT
  Filled 2012-02-02 (×22): qty 0.5

## 2012-02-02 MED ORDER — ATORVASTATIN CALCIUM 40 MG PO TABS
40.0000 mg | ORAL_TABLET | Freq: Every day | ORAL | Status: DC
  Administered 2012-02-02 – 2012-02-06 (×5): 40 mg via ORAL
  Filled 2012-02-02 (×8): qty 1

## 2012-02-02 MED ORDER — METOPROLOL TARTRATE 25 MG PO TABS
25.0000 mg | ORAL_TABLET | Freq: Two times a day (BID) | ORAL | Status: DC
  Administered 2012-02-02 – 2012-02-03 (×4): 25 mg via ORAL
  Filled 2012-02-02 (×6): qty 1

## 2012-02-02 MED ORDER — POTASSIUM CHLORIDE ER 10 MEQ PO TBCR
20.0000 meq | EXTENDED_RELEASE_TABLET | Freq: Every day | ORAL | Status: DC
  Administered 2012-02-02 – 2012-02-07 (×6): 20 meq via ORAL
  Filled 2012-02-02 (×6): qty 2

## 2012-02-02 MED ORDER — GUAIFENESIN ER 600 MG PO TB12
600.0000 mg | ORAL_TABLET | Freq: Two times a day (BID) | ORAL | Status: DC
  Administered 2012-02-02 – 2012-02-07 (×11): 600 mg via ORAL
  Filled 2012-02-02 (×12): qty 1

## 2012-02-02 MED ORDER — ACETAMINOPHEN 325 MG PO TABS
650.0000 mg | ORAL_TABLET | Freq: Four times a day (QID) | ORAL | Status: DC | PRN
  Administered 2012-02-03 – 2012-02-07 (×11): 650 mg via ORAL
  Filled 2012-02-02 (×9): qty 2

## 2012-02-02 MED ORDER — ASPIRIN EC 81 MG PO TBEC
81.0000 mg | DELAYED_RELEASE_TABLET | Freq: Every day | ORAL | Status: DC
  Administered 2012-02-02 – 2012-02-07 (×6): 81 mg via ORAL
  Filled 2012-02-02 (×6): qty 1

## 2012-02-02 MED ORDER — ONDANSETRON HCL 4 MG/2ML IJ SOLN: 4.0000 mg | Freq: Four times a day (QID) | INTRAMUSCULAR | Status: DC | PRN

## 2012-02-02 MED ORDER — DEXTROSE 5 % IV SOLN: 1.0000 g | Freq: Three times a day (TID) | INTRAVENOUS | Status: DC

## 2012-02-02 MED ORDER — VANCOMYCIN HCL 500 MG IV SOLR
500.0000 mg | Freq: Two times a day (BID) | INTRAVENOUS | Status: DC
  Administered 2012-02-02 – 2012-02-04 (×4): 500 mg via INTRAVENOUS
  Filled 2012-02-02 (×5): qty 500

## 2012-02-02 NOTE — Progress Notes (Signed)
ANTIBIOTIC CONSULT NOTE - INITIAL  Pharmacy Consult for Vancomycin Indication: HCAP  Allergies  Allergen Reactions  . Ciprofloxacin     REACTION: UNSPECIFIED  . Iron Diarrhea  . Morphine Sulfate Nausea And Vomiting    At high doses hallicuniations  . Naproxen Rash    REACTION: UNSPECIFIED    Patient Measurements: Height: 5' (152.4 cm) Weight: 135 lb (61.236 kg) IBW/kg (Calculated) : 45.5   Vital Signs: Temp: 98.8 F (37.1 C) (11/02 1133) Temp src: Oral (11/02 1133) BP: 116/38 mmHg (11/02 1300) Pulse Rate: 103  (11/02 1300) Intake/Output from previous day:   Intake/Output from this shift: Total I/O In: 230 [P.O.:180; IV Piggyback:50] Out: 130 [Urine:130]  Labs:  [facility] 02/02/12 1136  WBC 10.8*  HGB 9.7*  PLT 336  LABCREA --  CREATININE 0.80   Estimated Creatinine Clearance: 47.4 ml/min (by C-G formula based on Cr of 0.8). No results found for this basename: VANCOTROUGH:2,VANCOPEAK:2,VANCORANDOM:2,GENTTROUGH:2,GENTPEAK:2,GENTRANDOM:2,TOBRATROUGH:2,TOBRAPEAK:2,TOBRARND:2,AMIKACINPEAK:2,AMIKACINTROU:2,AMIKACIN:2, in the last 72 hours   Microbiology: Recent Results (from the past 720 hour(s))  SURGICAL PCR SCREEN     Status: Normal   Collection Time   01/18/12  3:36 PM      Component Value Range Status Comment   MRSA, PCR NEGATIVE  NEGATIVE Final    Staphylococcus aureus NEGATIVE  NEGATIVE Final   MRSA PCR SCREENING     Status: Normal   Collection Time   02/02/12  9:56 AM      Component Value Range Status Comment   MRSA by PCR NEGATIVE  NEGATIVE Final     Medical History: Past Medical History  Diagnosis Date  . Hypertension 08/01/1995  . Rheumatoid arthritis 04/03/1983  . Osteoarthritis 04/03/1983  . Anemia, deficiency 07/01/2004    (w/u by Dr. Lorre Nick)  . Diabetes mellitus type II 07/31/1997  . Osteoporosis 12/31/2000  . CAD (coronary artery disease) 09/01/2002    s/p NSTEMI 2012  . Hyperlipemia 07/02/1999  . COPD (chronic obstructive pulmonary  disease) 05/31/2004  . diverticulosis of colon   . Arteriovenous malformation of gastrointestinal tract   . Fatty liver   . Chronic back pain   . History of non-ST elevation myocardial infarction (NSTEMI) 07/22/2011  . Splenic artery aneurysm   . Anxiety and depression   . Anxiety    Assessment: Miss. Splitt is a 78 year old female recently discharged from [facility] 01/29/12 s/p CABG and present back to the hospital with respiratory distress to start empiric abx for HCAP. Pharmacy consulted to dose vancomycin and renally dose cefepime and levaquin.   Pt is currently afebrile and wbc 10.8. Her renal function is stable since last discharge with an estimated crcl of 10ml/min. Noted stop dates in place for cefepime and levaquin. Vancomycin to go for 8 days.   Goal of Therapy:  Vancomycin trough level 15-20 mcg/ml  Plan:  1. Change cefepime to 2g IV q24h 2. Change Levaquin to 500mg  IV daily  3. Begin Vancomycin 500mg  IV q12h x 8 days and f/u longer duration if necessary 4. F/u vancomycin trough and renal function to change doses if needed   Thank you! Brett Fairy, PharmD 02/02/2012 1:53 PM     Juliann Pulse 02/02/2012,1:47 PM

## 2012-02-02 NOTE — Progress Notes (Signed)
ADMISSION HISTORY AND PHYSICAL   Date: 02/02/2012               Patient Name:  [name] MRN: [id number]  DOB: [birth year] Age / Sex: 78 y.o., female        PCP: Karleen Hampshire Copland Primary Cardiologist: Marca Ancona, MD         History of Present Illness: Patient is a 78 y.o. female with a PMHx of COPD, HTN, diabetes, CAD and recent CABG , who was admitted to [facility] on 02/02/2012 for evaluation of severe respiratory failure.  [name] is a 78 yo with the above noted medical hx.   She was just [facility] from [facility] on 10/29 after having CABG.  She presented to [facility] ER with severe dyspnea and was transferred to [facility] for further eval.  She was on BIPAP when she arrived.  She has been decreased to 5 l [place] at present.  She is comfortable.  She has had productive cough - thick , white . Denies any fever, chills. Dysuria, N/V, diarrhea    Medications: Outpatient medications: Prescriptions prior to admission  Medication Sig Dispense Refill  . ALPRAZolam (XANAX) 0.25 MG tablet Take 1 tablet (0.25 mg total) by mouth 2 (two) times daily as needed for anxiety.  30 tablet  0  . ALPRAZolam (XANAX) 0.5 MG tablet Take 0.5 mg by mouth at bedtime as needed. For sleep      . aspirin EC 325 MG EC tablet Take 1 tablet (325 mg total) by mouth daily.  30 tablet    . atorvastatin (LIPITOR) 40 MG tablet Take 1 tablet (40 mg total) by mouth daily.      . fish oil-omega-3 fatty acids 1000 MG capsule Take 1 g by mouth daily.      . furosemide (LASIX) 40 MG tablet Take 1 tablet (40 mg total) by mouth daily.  7 tablet  0  . guaiFENesin (MUCINEX) 600 MG 12 hr tablet Take 600 mg by mouth 2 (two) times daily.       . metoprolol tartrate (LOPRESSOR) 25 MG tablet Take 25 mg by mouth 2 (two) times daily.        . Multiple Vitamin (MULTIVITAMIN WITH MINERALS) TABS Take 1 tablet by mouth daily.      [name] oxyCODONE (OXY IR/ROXICODONE) 5 MG immediate release tablet Take 1-2 tablets (5-10 mg total) by mouth every 6 (six) hours as  needed.  50 tablet  0  . oxyCODONE (OXYCONTIN) 20 MG 12 hr tablet Take 20 mg by mouth every 6 (six) hours.      . potassium chloride (K-DUR) 10 MEQ tablet Take 2 tablets (20 mEq total) by mouth daily.  7 tablet  0  . Probiotic Product (HEALTHY COLON PO) Take 1 capsule by mouth daily.       . ramipril (ALTACE) 10 MG capsule Take 1 capsule (10 mg total) by mouth daily.  30 capsule  11  . Saline (OCEAN NASAL SPRAY NA) Place 1-2 sprays into the nose daily as needed. For dry nasal passages        Allergies  Allergen Reactions  . Ciprofloxacin     REACTION: UNSPECIFIED  . Iron Diarrhea  . Morphine Sulfate Nausea And Vomiting    At high doses hallicuniations  . Naproxen Rash    REACTION: UNSPECIFIED     Past Medical History  Diagnosis Date  . Hypertension 08/01/1995  . Rheumatoid arthritis 04/03/1983  . Osteoarthritis 04/03/1983  . Anemia, deficiency  07/01/2004    (w/u by Dr. Lorre Nick)  . Diabetes mellitus type II 07/31/1997  . Osteoporosis 12/31/2000  . CAD (coronary artery disease) 09/01/2002    s/p NSTEMI 2012  . Hyperlipemia 07/02/1999  . COPD (chronic obstructive pulmonary disease) 05/31/2004  . diverticulosis of colon   . Arteriovenous malformation of gastrointestinal tract   . Fatty liver   . Chronic back pain   . History of non-ST elevation myocardial infarction (NSTEMI) 07/22/2011  . Splenic artery aneurysm   . Anxiety and depression   . Anxiety     Past Surgical History  Procedure Date  . Nasal sinus surgery   . Inguinal hernia repair   . Partial hysterectomy     ovaries intact, dysmennorhea w/ A/P repari  . Carpal tunnel release     bilat  . Trigger finger release     right  . Wrist surgery     neuroma repair, right  . Ankle surgery     neuroma repair, right ORIF  . Total shoulder replacement 12/13/1998    right, ([place])  . Rotator cuff repair 11/22/2003    ([place]) left  . Epidural block injection 05/2005    x 3  . Long finger tendon realignment  10/31/2005    [place]  . Lumbar epidural 06/13/2006  . Laminectomy 06/2006    L4/5 spinal stenosis (Dr. [name])  . Laminectomy 02/24/2008    L3/4 Ant/Lat interbody fusion and Lat Artthrodesis w/plate using XLIF (Dr. [name])  . Colonic embolization 04/18/2011    Dr. Wyn Quaker, acute GI bleed  . Spinal cord stimulator implant   . Coronary artery bypass graft 01/22/2012    Procedure: CORONARY ARTERY BYPASS GRAFTING (CABG);  Surgeon: Delight Ovens, MD;  Location: [facility] OR;  Service: Open Heart Surgery;  Laterality: N/A;  Coronary Artery Bypass Graft times four utilizing the left internal mammary artery and the left greater saphenous vein harvested endoscopically.  Rhae Hammock without cardioversion 01/22/2012    Procedure: TRANSESOPHAGEAL ECHOCARDIOGRAM (TEE);  Surgeon: Delight Ovens, MD;  Location: [facility] OR;  Service: Open Heart Surgery;  Laterality: N/A;    Family History  Problem Relation Age of Onset  . Hypotension Mother   . Dementia Mother   . Alcohol abuse Father   . Heart disease Father     MI, enlarged heart  . Diabetes Brother     Social History:  reports that she has been smoking Cigarettes.  She has smoked for the past 62 years. She has never used smokeless tobacco. She reports that she drinks alcohol. She reports that she does not use illicit drugs.   Review of Systems: Constitutional:  denies fever, chills, diaphoresis, appetite change and fatigue.  HEENT: denies photophobia, eye pain, redness, hearing loss, ear pain, congestion, sore throat, rhinorrhea, sneezing, neck pain, neck stiffness and tinnitus.  Respiratory: admits to SOB, DOE, cough, chest tightness, and wheezing.  Cardiovascular: denies chest pain, palpitations and leg swelling.  Gastrointestinal: denies nausea, vomiting, abdominal pain, diarrhea, constipation, blood in stool.  Genitourinary: denies dysuria, urgency, frequency, hematuria, flank pain and difficulty urinating.  Musculoskeletal: admits to  chronic back pain,    Skin: denies pallor, rash and wound.  Neurological: denies dizziness, seizures, syncope, weakness, light-headedness, numbness and headaches.   Hematological: denies adenopathy, easy bruising, personal or family bleeding history.  Psychiatric/ Behavioral: denies suicidal ideation, mood changes, confusion, nervousness, sleep disturbance and agitation.    Physical Exam: BP 109/53  Pulse 95  Temp 98.7 F (37.1 C) (Oral)  Resp 24  Ht 5' (1.524 m)  Wt 135 lb (61.236 kg)  BMI 26.37 kg/m2  SpO2 98%  General: Vital signs reviewed and noted.   Head: Normocephalic, atraumatic, sclera anicteric, mucus membranes are moist  Neck: Supple. Negative for carotid bruits. JVD not elevated.  Lungs:  Coarse rhonchi and wheezes on left side. Mild wheezing right base.  Heart: RRR with S1 S2. No murmurs, rubs, or gallops .  Sternotomy is well healed.   Abdomen:  Soft, non-tender, non-distended with normoactive bowel sounds. No hepatomegaly. No rebound/guarding. No obvious abdominal masses  MSK: Strength and the appear normal for age.  Extremities: No clubbing or cyanosis. No edema.  Distal pedal pulses are 2+ and equal bilaterally.  Neurologic: Alert and oriented X 3. Moves all extremities spontaneously  Psych:  Responds to questions appropriately with a normal affect.    Lab results: Basic Metabolic Panel:  Lab 01/28/12 4540 01/27/12 0730  NA 138 139  K 3.9 2.9*  CL 100 101  CO2 31 29  GLUCOSE 106* 128*  BUN 19 16  CREATININE 0.83 0.64  CALCIUM 8.8 8.5  MG -- --  PHOS -- --    Liver Function Tests: No results found for this basename: AST:3,ALT:3,ALKPHOS:3,BILITOT:3,PROT:3,ALBUMIN:3 in the last 168 hours No results found for this basename: LIPASE:3,AMYLASE:3 in the last 168 hours  CBC:  Lab 01/28/12 0610  WBC 6.8  NEUTROABS --  HGB 10.5*  HCT 32.2*  MCV 94.7  PLT 194    Cardiac Enzymes: No results found for this basename: CKTOTAL:5,CKMB:5,CKMBINDEX:5,TROPONINI:5 in the last 168  hours  BNP: No components found with this basename: POCBNP:3  CBG:  Lab 01/26/12 2022 01/26/12 1615 01/26/12 1239  GLUCAP 155* 124* 169*    Coagulation Studies: No results found for this basename: LABPROT:3,INR:3 in the last 72 hours   Assessment & Plan:  1. Respiratory failure: patient required BIPAP at [facility].  She improved with IV lasix. She has significant rhonchi and wheezes - especially on her left lung.  I have not been able to view the CRX ( on CD ROM) sent from [facility].  We will repeat the CXR.   2. Possible pneumonia:  Will treat her as a hospital acquired pneumonia since she has been in the hospital and then SNF.    3. COPD :  Will give xopenex, Atrovent, Q 6 hrs.  4. CAD: s/p recent CABG.  5. Hx of cigarette smoking: quit 2 Thuman ago  6. HTN: cont. Meds., hold ACE -inhibitor until we assure that she does not have renal insufficiency  7. Osteoarthritis: continue home meds.   DVT PPX - LDSQH   Vesta Mixer, Montez Hageman., MD, [facility] 02/02/2012, 11:00 AM

## 2012-02-03 ENCOUNTER — Encounter (HOSPITAL_COMMUNITY): Payer: Self-pay | Admitting: *Deleted

## 2012-02-03 DIAGNOSIS — J189 Pneumonia, unspecified organism: Secondary | ICD-10-CM

## 2012-02-03 LAB — URINE CULTURE: Culture: NO GROWTH

## 2012-02-03 LAB — LEGIONELLA ANTIGEN, URINE: Legionella Antigen, Urine: NEGATIVE

## 2012-02-03 MED ORDER — FUROSEMIDE 10 MG/ML IJ SOLN
60.0000 mg | Freq: Every day | INTRAMUSCULAR | Status: DC
  Administered 2012-02-03 – 2012-02-06 (×4): 60 mg via INTRAVENOUS
  Filled 2012-02-03 (×4): qty 6

## 2012-02-03 MED ORDER — FUROSEMIDE 10 MG/ML IJ SOLN
INTRAMUSCULAR | Status: AC
  Administered 2012-02-03: 60 mg via INTRAVENOUS
  Filled 2012-02-03: qty 8

## 2012-02-03 NOTE — Progress Notes (Signed)
Patient ID: [name], female   DOB: [birth year], 78 y.o.   MRN: [id number] TCTS DAILY PROGRESS NOTE                   [address]            [place] [address]          [phone]           Total Length of Stay:  LOS: 1 day   Subjective: patient with sever underlying pulmonary disease and chronically elevated rt diaphragm ( present prior to CABG) admitted with copd exacerbation, now comfortable at baseline . Has had O2 at home for 15 years but says she does not [place] it  Objective: Vital signs in last 24 hours: Temp:  [97.8 F (36.6 C)-99.1 F (37.3 C)] 97.8 F (36.6 C) (11/03 0400) Pulse Rate:  [71-103] 97  (11/03 0700) Cardiac Rhythm:  [-] Normal sinus rhythm (11/03 0600) Resp:  [18-27] 22  (11/03 0700) BP: (84-140)/(36-76) 132/51 mmHg (11/03 0600) SpO2:  [92 %-99 %] 97 % (11/03 0700) Weight:  [131 lb 13.4 oz (59.8 kg)-135 lb (61.236 kg)] 131 lb 13.4 oz (59.8 kg) (11/03 0700)  Filed Weights   02/02/12 0940 02/03/12 0700  Weight: 135 lb (61.236 kg) 131 lb 13.4 oz (59.8 kg)    Weight change:    Hemodynamic parameters for last 24 hours:    Intake/Output from previous day: 11/02 0701 - 11/03 0700 In: 1210 [P.O.:960; IV Piggyback:250] Out: 2176 [Urine:2175; Stool:1]  Intake/Output this shift:    Current Meds: Scheduled Meds:   . albuterol  2.5 mg Nebulization Q6H  . aspirin EC  81 mg Oral Daily  . atorvastatin  40 mg Oral QPC supper  . ceFEPime (MAXIPIME) IV  2 g Intravenous Q24H  . furosemide  40 mg Oral Daily  . guaiFENesin  600 mg Oral BID  . heparin  5,000 Units Subcutaneous Q8H  . ipratropium  0.5 mg Nebulization Q6H  . levofloxacin (LEVAQUIN) IV  500 mg Intravenous Q24H  . metoprolol tartrate  25 mg Oral BID  . multivitamin with minerals  1 tablet Oral Daily  . OxyCODONE  20 mg Oral Q12H  . potassium chloride  20 mEq Oral Daily  . sodium chloride  3 mL Intravenous Q12H  . vancomycin  500 mg Intravenous Q12H   Continuous  Infusions:  PRN Meds:.acetaminophen, acetaminophen, ALPRAZolam, ondansetron (ZOFRAN) IV, ondansetron, oxyCODONE, sodium chloride  BP 132/51  Pulse 97  Temp 98.9 F (37.2 C) (Oral)  Resp 22  Ht 5' (1.524 m)  Wt 131 lb 13.4 oz (59.8 kg)  BMI 25.75 kg/m2  SpO2 97%  General appearance: alert, cooperative and no distress Neurologic: intact Heart: regular rate and rhythm, S1, S2 normal, no murmur, click, rub or gallop and normal apical impulse Lungs: diminished breath sounds RLL Abdomen: soft, non-tender; bowel sounds normal; no masses,  no organomegaly Extremities: extremities normal, atraumatic, no cyanosis or edema and Homans sign is negative, no sign of DVT Wound: sternum stable and well healed  Lab Results: CBC: Basename 02/02/12 1136  WBC 10.8*  HGB 9.7*  HCT 29.8*  PLT 336   BMET:  Basename 02/02/12 1136  NA 137  K 3.7  CL 96  CO2 34*  GLUCOSE 101*  BUN 18  CREATININE 0.80  CALCIUM 9.0    PT/INR: No results found for this basename: LABPROT,INR in the last 72 hours Radiology: Portable Chest 1 View  02/02/2012  *  RADIOLOGY REPORT*  Clinical Data: Shortness of breath, cough  PORTABLE CHEST - 1 VIEW  Comparison: 01/27/2012  Findings: Bilateral shoulder arthroplasties evident.  Persistent low lung volumes with basilar atelectasis and small effusions. Minimal improvement compared to 01/27/2012.  No pneumothorax. Coronary bypass changes noted.  Thoracic stimulator evident.  IMPRESSION: Persistent basilar atelectasis and small effusions.   Original Report Authenticated By: Judie Petit. Miles Costain, M.D.      Assessment/Plan: Patient had PFT prior to surgery- but not in EPIC CABG incisions well healed Increase ambulation  Will need long term pulmonary care for severe COPD   Doha Boling B 02/03/2012 7:11 AM

## 2012-02-03 NOTE — Progress Notes (Signed)
Primary cardiologist: Dr. Marca Ancona  Subjective:   Complains of intermittent leg cramps, mainly on the left. Still short of breath, no cough or chest pain.   Objective:   Temp:  [97.8 F (36.6 C)-99.1 F (37.3 C)] 98.9 F (37.2 C) (11/03 0712) Pulse Rate:  [71-103] 97  (11/03 0700) Resp:  [18-27] 22  (11/03 0700) BP: (84-140)/(36-76) 132/51 mmHg (11/03 0600) SpO2:  [92 %-99 %] 97 % (11/03 0833) Weight:  [131 lb 13.4 oz (59.8 kg)-135 lb (61.236 kg)] 131 lb 13.4 oz (59.8 kg) (11/03 0700) Last BM Date: 02/03/12  Filed Weights   02/02/12 0940 02/03/12 0700  Weight: 135 lb (61.236 kg) 131 lb 13.4 oz (59.8 kg)    Intake/Output Summary (Last 24 hours) at 02/03/12 0850 Last data filed at 02/03/12 0600  Gross per 24 hour  Intake   1210 ml  Output   2176 ml  Net   -966 ml   Telemetry: Sinus rhythm and sinus tachycardia.  Exam:  General: Elderly woman, mildly short of breath at rest.  Lungs: Coarse rhonchi throughout, decreased breath sounds without wheezing, diminished at the right base.  Cardiac: Distant regular heart sounds.  Thorax: Well healing sternal incision.  Extremities: No pitting.  Lab Results:  Basic Metabolic Panel:  Lab 02/02/12 4098 01/28/12 0610  NA 137 138  K 3.7 3.9  CL 96 100  CO2 34* 31  GLUCOSE 101* 106*  BUN 18 19  CREATININE 0.80 0.83  CALCIUM 9.0 8.8  MG -- --    CBC:  Lab 02/02/12 1136 01/28/12 0610  WBC 10.8* 6.8  HGB 9.7* 10.5*  HCT 29.8* 32.2*  MCV 93.7 94.7  PLT 336 194    CXR (11/2): Findings: Bilateral shoulder arthroplasties evident. Persistent low lung volumes with basilar atelectasis and small effusions. Minimal improvement compared to 01/27/2012. No pneumothorax. Coronary bypass changes noted. Thoracic stimulator evident.  IMPRESSION: Persistent basilar atelectasis and small effusions.   Medications:   Scheduled Medications:    . albuterol  2.5 mg Nebulization Q6H  . aspirin EC  81 mg Oral Daily  .  atorvastatin  40 mg Oral QPC supper  . ceFEPime (MAXIPIME) IV  2 g Intravenous Q24H  . furosemide  40 mg Oral Daily  . guaiFENesin  600 mg Oral BID  . heparin  5,000 Units Subcutaneous Q8H  . ipratropium  0.5 mg Nebulization Q6H  . levofloxacin (LEVAQUIN) IV  500 mg Intravenous Q24H  . metoprolol tartrate  25 mg Oral BID  . multivitamin with minerals  1 tablet Oral Daily  . OxyCODONE  20 mg Oral Q12H  . potassium chloride  20 mEq Oral Daily  . sodium chloride  3 mL Intravenous Q12H  . vancomycin  500 mg Intravenous Q12H  . [DISCONTINUED] ceFEPime (MAXIPIME) IV  1 g Intravenous Q8H  . [DISCONTINUED] levofloxacin (LEVAQUIN) IV  500 mg Intravenous Q24H  . [DISCONTINUED] levofloxacin (LEVAQUIN) IV  750 mg Intravenous Q24H  . [DISCONTINUED] levofloxacin (LEVAQUIN) IV  750 mg Intravenous Q24H  . [DISCONTINUED] levofloxacin (LEVAQUIN) IV  750 mg Intravenous Q24H  . [DISCONTINUED] oxyCODONE  20 mg Oral Q12H     Infusions:     PRN Medications:  acetaminophen, acetaminophen, ALPRAZolam, ondansetron (ZOFRAN) IV, ondansetron, oxyCODONE, sodium chloride   Assessment:   1. Admitted in transfer from [facility] with respiratory distress, reportedly improved with BiPAP and diuresis. Reports recent cough, chest x-ray without definite infiltrate however. She is being treated for possible pneumonia, recent hospitalization and  also nursing home care.  2. COPD, cannot locate preoperative PFTs at this time. Exacerbation also suspected.  3. Multivessel CAD status post CABG with LIMA to the LAD, SVG to the diagonal, SVG to the circumflex, and SVG to the RCA on 10/22 per Dr. Tyrone Sage. Preoperative echocardiogram with LVEF 55-60%.  4. Anemia, hemoglobin 9.7, down from 10.5 on 10/28. Guaiac stools.  5. Hypertension.  6. Disposition - patient previously doing rehabilitation in a nursing facility in [place], states that she does not want to return. States that she will go home with her son. Need input from  case manager.   Plan/Discussion:    Patient placed on broad-spectrum antibiotics by Dr. Elease Hashimoto. Will change Lasix to IV as well, does have small residual pleural effusions by chest x-ray. She needs further pulmonary toilet, possibly Pulmonary Medicine consultation if she does not turn around fairly quickly. Case manager consult. Possible transfer out to telemetry this afternoon.   Jonelle Sidle, M.D., F.A.C.C.

## 2012-02-04 ENCOUNTER — Inpatient Hospital Stay (HOSPITAL_COMMUNITY): Payer: Medicare Other

## 2012-02-04 ENCOUNTER — Ambulatory Visit: Payer: Medicare Other | Admitting: Cardiology

## 2012-02-04 DIAGNOSIS — I517 Cardiomegaly: Secondary | ICD-10-CM

## 2012-02-04 DIAGNOSIS — J9600 Acute respiratory failure, unspecified whether with hypoxia or hypercapnia: Principal | ICD-10-CM

## 2012-02-04 LAB — BASIC METABOLIC PANEL
CO2: 35 mEq/L — ABNORMAL HIGH (ref 19–32)
Chloride: 97 mEq/L (ref 96–112)
Glucose, Bld: 105 mg/dL — ABNORMAL HIGH (ref 70–99)
Sodium: 139 mEq/L (ref 135–145)

## 2012-02-04 LAB — CBC
Hemoglobin: 9.6 g/dL — ABNORMAL LOW (ref 12.0–15.0)
MCH: 30.8 pg (ref 26.0–34.0)
Platelets: 333 10*3/uL (ref 150–400)
RBC: 3.12 MIL/uL — ABNORMAL LOW (ref 3.87–5.11)
WBC: 10.5 10*3/uL (ref 4.0–10.5)

## 2012-02-04 MED ORDER — BISACODYL 5 MG PO TBEC
5.0000 mg | DELAYED_RELEASE_TABLET | Freq: Every day | ORAL | Status: DC | PRN
  Administered 2012-02-04 – 2012-02-05 (×2): 5 mg via ORAL
  Filled 2012-02-04 (×2): qty 1

## 2012-02-04 MED ORDER — LEVOFLOXACIN 750 MG PO TABS
750.0000 mg | ORAL_TABLET | Freq: Every day | ORAL | Status: DC
  Administered 2012-02-04: 750 mg via ORAL
  Filled 2012-02-04: qty 1

## 2012-02-04 MED ORDER — DOCUSATE SODIUM 100 MG PO CAPS
100.0000 mg | ORAL_CAPSULE | Freq: Two times a day (BID) | ORAL | Status: DC
  Administered 2012-02-04 – 2012-02-07 (×7): 100 mg via ORAL
  Filled 2012-02-04 (×9): qty 1

## 2012-02-04 MED ORDER — LEVOFLOXACIN 750 MG PO TABS
750.0000 mg | ORAL_TABLET | ORAL | Status: DC
  Administered 2012-02-06: 750 mg via ORAL
  Filled 2012-02-04: qty 1

## 2012-02-04 MED ORDER — BISOPROLOL FUMARATE 5 MG PO TABS
5.0000 mg | ORAL_TABLET | Freq: Every day | ORAL | Status: DC
  Administered 2012-02-04 – 2012-02-07 (×4): 5 mg via ORAL
  Filled 2012-02-04 (×4): qty 1

## 2012-02-04 MED ORDER — VANCOMYCIN HCL 1000 MG IV SOLR
750.0000 mg | INTRAVENOUS | Status: DC
  Administered 2012-02-04 – 2012-02-05 (×2): 750 mg via INTRAVENOUS
  Filled 2012-02-04 (×2): qty 750

## 2012-02-04 NOTE — Progress Notes (Signed)
Patient ID: [name], female   DOB: 1931/08/03, 78 y.o.   MRN: [id number]   Subjective:   Breathing is getting better.  No chest pain.  Good oxygen saturation this morning on nasal cannula.    Objective:   Temp:  [98.5 F (36.9 C)-99.3 F (37.4 C)] 98.8 F (37.1 C) (11/04 0717) Pulse Rate:  [66-100] 80  (11/04 0700) Resp:  [18-27] 19  (11/04 0700) BP: (99-138)/(38-74) 133/61 mmHg (11/04 0700) SpO2:  [92 %-98 %] 97 % (11/04 0805) Weight:  [132 lb 15 oz (60.3 kg)] 132 lb 15 oz (60.3 kg) (11/04 0500) Last BM Date: 02/03/12  Filed Weights   02/02/12 0940 02/03/12 0700 02/04/12 0500  Weight: 135 lb (61.236 kg) 131 lb 13.4 oz (59.8 kg) 132 lb 15 oz (60.3 kg)    Intake/Output Summary (Last 24 hours) at 02/04/12 0837 Last data filed at 02/04/12 0800  Gross per 24 hour  Intake    656 ml  Output   2910 ml  Net  -2254 ml   Telemetry: Sinus rhythm and sinus tachycardia.  Exam:  General: Elderly woman, mildly short of breath at rest.  Neck: JVP 8 cm  Lungs:  Decreased breath sounds at bases, occasional rhonchi.   Cardiac: Distant regular heart sounds.  Thorax: Well healing sternal incision.  Extremities: No pitting edema.   Lab Results:  Basic Metabolic Panel:  Lab 02/04/12 4098 02/02/12 1136  NA 139 137  K 4.2 3.7  CL 97 96  CO2 35* 34*  GLUCOSE 105* 101*  BUN 19 18  CREATININE 0.88 0.80  CALCIUM 9.2 9.0  MG -- --    CBC:  Lab 02/04/12 0432 02/02/12 1136  WBC 10.5 10.8*  HGB 9.6* 9.7*  HCT 29.2* 29.8*  MCV 93.6 93.7  PLT 333 336    CXR (11/2): Findings: Bilateral shoulder arthroplasties evident. Persistent low lung volumes with basilar atelectasis and small effusions. Minimal improvement compared to 01/27/2012. No pneumothorax. Coronary bypass changes noted. Thoracic stimulator evident.  IMPRESSION: Persistent basilar atelectasis and small effusions.   Medications:   Scheduled Medications:    . albuterol  2.5 mg Nebulization Q6H  .  aspirin EC  81 mg Oral Daily  . atorvastatin  40 mg Oral QPC supper  . bisoprolol  5 mg Oral Daily  . furosemide  60 mg Intravenous Daily  . guaiFENesin  600 mg Oral BID  . heparin  5,000 Units Subcutaneous Q8H  . ipratropium  0.5 mg Nebulization Q6H  . levofloxacin  750 mg Oral Daily  . multivitamin with minerals  1 tablet Oral Daily  . OxyCODONE  20 mg Oral Q12H  . potassium chloride  20 mEq Oral Daily  . sodium chloride  3 mL Intravenous Q12H  . vancomycin  500 mg Intravenous Q12H  . [DISCONTINUED] ceFEPime (MAXIPIME) IV  2 g Intravenous Q24H  . [DISCONTINUED] furosemide  40 mg Oral Daily  . [DISCONTINUED] levofloxacin (LEVAQUIN) IV  500 mg Intravenous Q24H  . [DISCONTINUED] metoprolol tartrate  25 mg Oral BID    Infusions:    PRN Medications: acetaminophen, acetaminophen, ALPRAZolam, ondansetron (ZOFRAN) IV, ondansetron, oxyCODONE, sodium chloride   Assessment/Plan:   1. Respiratory failure:  Admitted in transfer from [facility] with respiratory distress, reportedly improved with BiPAP and diuresis. Reports recent cough, chest x-ray without definite infiltrate however. She is being treated for possible pneumonia/COPD exacerbation, recent hospitalization and also nursing home care.  She is currently on vancomycin/levofloxacin/cefepime.  Will stop cefepime today, continue  vanco and levofloxacin for now.  Also treating for component of acute/chronic diastolic CHF.  Will get repeat CXR PA/lateral today.   2. COPD: cannot locate preoperative PFTs at this time. COPD exacerbation suspected.  Will stop metoprolol and use bisoprolol for greater beta-1 selectivity.  Continue atrovent/albuterol nebs.  Holding off on steroids for now with recent cardiac surgery.   3. CHF: Suspect component of acute/chronic diastolic CHF.  Will get echo to make sure EF remains normal s/p CABG.  Continue IV Lasix as ordered.  BNP in am.   4. Multivessel CAD status post CABG with LIMA to the LAD, SVG to the  diagonal, SVG to the circumflex, and SVG to the RCA on 10/22 per Dr. Tyrone Sage. Preoperative echocardiogram with LVEF 55-60%.  5. Anemia: Stable.   5. Hypertension.  6. Disposition -   Ambulate today with PT.  Patient previously doing rehabilitation in a nursing facility in [place], states that she does not want to return. States that she will go home with her son. Need input from case manager.  [name] 02/04/2012 8:41 AM

## 2012-02-04 NOTE — Progress Notes (Signed)
  Echocardiogram 2D Echocardiogram has been performed.  Georgian Co 02/04/2012, 4:09 PM

## 2012-02-04 NOTE — Progress Notes (Signed)
ANTIBIOTIC CONSULT NOTE - follow up Pharmacy Consult for Vancomycin, adjust abx for renal function - on levofloxacin Indication: HCAP  Allergies  Allergen Reactions  . Ciprofloxacin     REACTION: UNSPECIFIED  . Iron Diarrhea  . Morphine Sulfate Nausea And Vomiting    At high doses hallicuniations  . Naproxen Rash    REACTION: UNSPECIFIED    Patient Measurements: Height: 5' (152.4 cm) Weight: 132 lb 15 oz (60.3 kg) IBW/kg (Calculated) : 45.5   Vital Signs: Temp: 98.5 F (36.9 C) (11/04 1106) Temp src: Oral (11/04 1106) BP: 117/58 mmHg (11/04 1106) Pulse Rate: 92  (11/04 1251) Intake/Output from previous day: 11/03 0701 - 11/04 0700 In: 656 [P.O.:300; IV Piggyback:356] Out: 2810 [Urine:2810] Intake/Output from this shift: Total I/O In: 246 [P.O.:240; IV Piggyback:6] Out: 800 [Urine:800]  Labs:  [facility] 02/04/12 0432 02/02/12 1136  WBC 10.5 10.8*  HGB 9.6* 9.7*  PLT 333 336  LABCREA -- --  CREATININE 0.88 0.80   Estimated Creatinine Clearance: 42.8 ml/min (by C-G formula based on Cr of 0.88). No results found for this basename: VANCOTROUGH:2,VANCOPEAK:2,VANCORANDOM:2,GENTTROUGH:2,GENTPEAK:2,GENTRANDOM:2,TOBRATROUGH:2,TOBRAPEAK:2,TOBRARND:2,AMIKACINPEAK:2,AMIKACINTROU:2,AMIKACIN:2, in the last 72 hours   Microbiology: Recent Results (from the past 720 hour(s))  SURGICAL PCR SCREEN     Status: Normal   Collection Time   01/18/12  3:36 PM      Component Value Range Status Comment   MRSA, PCR NEGATIVE  NEGATIVE Final    Staphylococcus aureus NEGATIVE  NEGATIVE Final   MRSA PCR SCREENING     Status: Normal   Collection Time   02/02/12  9:56 AM      Component Value Range Status Comment   MRSA by PCR NEGATIVE  NEGATIVE Final   URINE CULTURE     Status: Normal   Collection Time   02/02/12 11:29 AM      Component Value Range Status Comment   Specimen Description URINE, RANDOM   Final    Special Requests NONE   Final    Culture  Setup Time 02/02/2012 20:10   Final     Colony Count NO GROWTH   Final    Culture NO GROWTH   Final    Report Status 02/03/2012 FINAL   Final   CULTURE, EXPECTORATED SPUTUM-ASSESSMENT     Status: Normal   Collection Time   02/02/12 11:53 AM      Component Value Range Status Comment   Specimen Description SPUTUM   Final    Special Requests NONE   Final    Sputum evaluation     Final    Value: MICROSCOPIC FINDINGS SUGGEST THAT THIS SPECIMEN IS NOT REPRESENTATIVE OF LOWER RESPIRATORY SECRETIONS. PLEASE RECOLLECT.     CALLED TO PATTY,RN 1545 04/04/11 EHOWARD   Report Status 02/02/2012 FINAL   Final     Assessment: Miss. Passow is a 78 year old female recently discharged from [facility] 01/29/12 s/p CABG and present back to the hospital with respiratory distress to start empiric abx for HCAP. Pharmacy consulted to dose vancomycin and renally dose cefepime and levaquin.  Cefepime was stopped today 11/4 and levaquin was changed to PO.  Creatinine = 0.88 and creat cl ~ 43 ml/min. Vanc ordered x 8 days. Today is day # 3/7 for vanc and day # 2of levaquin (first dose at [facility] 02/03/12).  Pt is currently afebrile and wbc 10.5. Urine NG final. Sputum culture bad.    Goal of Therapy:  Vancomycin trough level 15-20 mcg/ml  Plan:  1. Change Levaquin to  750 mg PO q48hrs 3. Change vancomcyin to 750 mg IV q24 for total of 8 days  4. F/u vancomycin trough and renal function to change doses if needed   Herby Abraham, Pharm.D. [phone] 02/04/2012 2:09 PM

## 2012-02-04 NOTE — Progress Notes (Signed)
Report called to Inetta Fermo, RN and met RN at bedside.  Patient transferred to 2022 without complications. All belongings transferred with patient including a suitcase, walker, and cell phone.

## 2012-02-04 NOTE — Progress Notes (Signed)
CARDIAC REHAB PHASE I   PRE:  Rate/Rhythm: 66SR  BP:  Supine:   Sitting: 110/50  Standing:    SaO2: 94%2L  MODE:  Ambulation: 350 ft   POST:  Rate/Rhythem: 89  BP:  Supine:   Sitting: 120/60  Standing:    SaO2: 93%2L 1415-1500 Pt walked 350 ft on 2Lwith rollator and asst x 2. Tolerated well. Can be asst x 1. To recliner with call bell.   [name]

## 2012-02-04 NOTE — Evaluation (Signed)
Physical Therapy Evaluation Patient Details Name: [name] MRN: [id number] DOB: 03/17/1932 Today's Date: 02/04/2012 Time: 7829-5621 PT Time Calculation (min): 35 min  PT Assessment / Plan / Recommendation Clinical Impression  Pt is s/p posible pneumonia/COPD exacerbation and recent CABG (01/22/12).  Pt demonstrates general weakness and decreased endurance.  Should do well to go home with son.     PT Assessment  Patient needs continued PT services    Follow Up Recommendations  Outpatient PT    Does the patient have the potential to tolerate intense rehabilitation      Barriers to Discharge None      Equipment Recommendations  None recommended by PT    Recommendations for Other Services     Frequency Min 3X/week    Precautions / Restrictions Precautions Precautions: Sternal;Fall   Pertinent Vitals/Pain VSS with O2 with ambulation and appropriate response to exercise, no pain.      Mobility  Bed Mobility Bed Mobility: Not assessed Transfers Transfers: Sit to Stand;Stand to Sit;Stand Pivot Transfers Sit to Stand: 7: Independent;Without upper extremity assist Stand to Sit: To chair/3-in-1;With upper extremity assist;6: Modified independent (Device/Increase time) Stand Pivot Transfers: 6: Modified independent (Device/Increase time);With armrests Ambulation/Gait Ambulation/Gait Assistance: 5: Supervision Ambulation Distance (Feet): 300 Feet Assistive device: Rolling walker Ambulation/Gait Assistance Details: pt walked with rolling walker, able to hold conversation while walking and without loss of balance.  Patient needed cues to slow down frequently for energy conservation.   Gait Pattern: Step-through pattern;Trunk flexed Stairs: No Wheelchair Mobility Wheelchair Mobility: No              PT Diagnosis: Difficulty walking;Generalized weakness  PT Problem List: Decreased strength;Decreased activity tolerance;Decreased balance;Pain PT Treatment Interventions:  DME instruction;Gait training;Functional mobility training;Therapeutic activities;Therapeutic exercise;Balance training   PT Goals Acute Rehab PT Goals PT Goal Formulation: With patient Time For Goal Achievement: 02/18/12 Potential to Achieve Goals: Good Pt will go Stand to Sit: Independently PT Goal: Stand to Sit - Progress: Goal set today Pt will Ambulate: >150 feet;with modified independence;with least restrictive assistive device PT Goal: Ambulate - Progress: Goal set today Pt will Perform Home Exercise Program: Independently PT Goal: Perform Home Exercise Program - Progress: Goal set today  Visit Information  Last PT Received On: 02/04/12 Assistance Needed: +1    Subjective Data  Subjective: "I have to go to the bathroom, I haven't gone to the bathroom in three days."   While walking, pt reports that she was going to the [facility] and taking balance classes as well as line dancing prior to her CABG procedure.  Patient Stated Goal: To return home    Prior Functioning  Home Living Lives With: Alone Available Help at Discharge: Family;Available 24 hours/day Type of Home: House Home Access: Stairs to enter [employer] of Steps: 2 Home Layout: One level Bathroom Shower/Tub: Tub/shower unit;Door [employer] Toilet: Standard Bathroom Accessibility: Yes Home Adaptive Equipment: Built-in shower seat;Hand-held shower hose;Walker - rolling Additional Comments: Pt has oxygen at home but states that she does not use it Prior Function Level of Independence: Independent Comments: Prior to surgery pt was attending classes at the [facility] and lived independently.  After surgery she was at a nursing home and stated she was working on strengthening there until she fell ill again.  Communication Communication: No difficulties Dominant Hand: Right    Cognition  Overall Cognitive Status: Appears within functional limits for tasks assessed/performed Arousal/Alertness:  Awake/alert Orientation Level: Oriented X4 / Intact Behavior During Session: [facility] for tasks  performed    Extremity/Trunk Assessment Right Upper Extremity Assessment RUE ROM/Strength/Tone: [facility] for tasks assessed (per sternal precautions, pt did not use UE with activity) Left Upper Extremity Assessment LUE ROM/Strength/Tone: WFL for tasks assessed (Per sternal precautions pt did not use UE with activity) Right Lower Extremity Assessment RLE ROM/Strength/Tone: WFL for tasks assessed Left Lower Extremity Assessment LLE ROM/Strength/Tone: WFL for tasks assessed Trunk Assessment Trunk Assessment: Normal   Balance Balance Balance Assessed: No  End of Session PT - End of Session Equipment Utilized During Treatment: Gait belt;Oxygen Activity Tolerance: Patient tolerated treatment well Patient left: with call bell/phone within reach;Other (comment) (Pt left in [facility] as she was preparing to transfer to a new room) Nurse Communication: Mobility status    Sharion Balloon 02/04/2012, 1:23 PM  Sharion Balloon, SPT Acute Rehab Services [phone]  [facility] Acute Rehabilitation [phone] (pager)

## 2012-02-05 DIAGNOSIS — I509 Heart failure, unspecified: Secondary | ICD-10-CM

## 2012-02-05 DIAGNOSIS — I5032 Chronic diastolic (congestive) heart failure: Secondary | ICD-10-CM

## 2012-02-05 DIAGNOSIS — I5033 Acute on chronic diastolic (congestive) heart failure: Secondary | ICD-10-CM

## 2012-02-05 LAB — BASIC METABOLIC PANEL
BUN: 23 mg/dL (ref 6–23)
CO2: 33 mEq/L — ABNORMAL HIGH (ref 19–32)
Calcium: 9.3 mg/dL (ref 8.4–10.5)
Creatinine, Ser: 0.92 mg/dL (ref 0.50–1.10)

## 2012-02-05 LAB — CBC
MCHC: 32.7 g/dL (ref 30.0–36.0)
MCV: 93.2 fL (ref 78.0–100.0)
Platelets: 350 10*3/uL (ref 150–400)
RDW: 13.2 % (ref 11.5–15.5)
WBC: 10.1 10*3/uL (ref 4.0–10.5)

## 2012-02-05 MED ORDER — SALINE SPRAY 0.65 % NA SOLN
1.0000 | NASAL | Status: DC | PRN
  Administered 2012-02-06: 1 via NASAL
  Filled 2012-02-05: qty 44

## 2012-02-05 NOTE — Progress Notes (Signed)
Patient ID: VRINDA NASSIRI, female   DOB: Nov 08, 1931, 78 y.o.   MRN: [id number]     Subjective:   Breathing better.  No chest pain.  Walked with PT yesterday.    Objective:   Temp:  [98.3 F (36.8 C)-98.5 F (36.9 C)] 98.3 F (36.8 C) (11/05 0412) Pulse Rate:  [78-98] 79  (11/05 0412) Resp:  [18-23] 18  (11/05 0412) BP: (114-133)/(56-70) 114/70 mmHg (11/05 0412) SpO2:  [90 %-95 %] 95 % (11/05 0412) Weight:  [137 lb 1.6 oz (62.188 kg)] 137 lb 1.6 oz (62.188 kg) (11/05 0412) Last BM Date: 02/04/12  Filed Weights   02/03/12 0700 02/04/12 0500 02/05/12 0412  Weight: 131 lb 13.4 oz (59.8 kg) 132 lb 15 oz (60.3 kg) 137 lb 1.6 oz (62.188 kg)    Intake/Output Summary (Last 24 hours) at 02/05/12 0817 Last data filed at 02/05/12 0300  Gross per 24 hour  Intake    726 ml  Output   2200 ml  Net  -1474 ml   Telemetry: Sinus rhythm and sinus tachycardia.  Exam:  General: Elderly woman, no distress  Neck: JVP 7 cm  Lungs:  Decreased breath sounds at bases, occasional rhonchi.   Cardiac: Distant regular heart sounds.  Thorax: Well healing sternal incision.  Extremities: No pitting edema.   Lab Results:  Basic Metabolic Panel:  Lab 02/05/12 9629 02/04/12 0432 02/02/12 1136  NA 138 139 137  K 4.1 4.2 3.7  CL 98 97 96  CO2 33* 35* 34*  GLUCOSE 108* 105* 101*  BUN 23 19 18   CREATININE 0.92 0.88 0.80  CALCIUM 9.3 9.2 9.0  MG -- -- --    CBC:  Lab 02/05/12 0455 02/04/12 0432 02/02/12 1136  WBC 10.1 10.5 10.8*  HGB 9.8* 9.6* 9.7*  HCT 30.0* 29.2* 29.8*  MCV 93.2 93.6 93.7  PLT 350 333 336    CXR (11/4): Findings:Moderate right, small left persistent pleural effusions  Medications:   Scheduled Medications:    . albuterol  2.5 mg Nebulization Q6H  . aspirin EC  81 mg Oral Daily  . atorvastatin  40 mg Oral QPC supper  . bisoprolol  5 mg Oral Daily  . docusate sodium  100 mg Oral BID  . furosemide  60 mg Intravenous Daily  . guaiFENesin  600 mg Oral BID  .  heparin  5,000 Units Subcutaneous Q8H  . ipratropium  0.5 mg Nebulization Q6H  . levofloxacin  750 mg Oral Q48H  . multivitamin with minerals  1 tablet Oral Daily  . OxyCODONE  20 mg Oral Q12H  . potassium chloride  20 mEq Oral Daily  . sodium chloride  3 mL Intravenous Q12H  . vancomycin  750 mg Intravenous Q24H  . [DISCONTINUED] ceFEPime (MAXIPIME) IV  2 g Intravenous Q24H  . [DISCONTINUED] levofloxacin (LEVAQUIN) IV  500 mg Intravenous Q24H  . [DISCONTINUED] levofloxacin  750 mg Oral Daily  . [DISCONTINUED] metoprolol tartrate  25 mg Oral BID  . [DISCONTINUED] vancomycin  500 mg Intravenous Q12H    Infusions:    PRN Medications: acetaminophen, acetaminophen, ALPRAZolam, bisacodyl, ondansetron (ZOFRAN) IV, ondansetron, oxyCODONE, sodium chloride   Assessment/Plan:   1. Respiratory failure:  Admitted in transfer from [facility] with respiratory distress, reportedly improved with BiPAP and diuresis. Reports recent cough, chest x-ray without definite infiltrate however. She is being treated for possible pneumonia/COPD exacerbation.  She is currently on vancomycin/levofloxacin.  Can stop vancomycin, will continue levofloxacin.  Also treating for component of  acute/chronic diastolic CHF.  Repeat CXR yesterday showed persistent bilateral pleural effusions, moderate right/small left.  Will see if she can come off oxygen today.   2. COPD: cannot locate preoperative PFTs at this time. COPD exacerbation suspected. Continue atrovent/albuterol nebs.  Holding off on steroids for now with recent cardiac surgery.   3. CHF: Suspect component of acute/chronic diastolic CHF.  EF preserved on echo yesterday.  Continue IV Lasix as ordered for another day.  Volume status starting to look better.    4. Multivessel CAD status post CABG with LIMA to the LAD, SVG to the diagonal, SVG to the circumflex, and SVG to the RCA on 10/22 per Dr. Tyrone Sage.   5. Anemia: Stable.   5. Hypertension.  6. Disposition -    Probably home with home health/PT tomorrow.  Will stay with son.   Marca Ancona 02/05/2012 8:17 AM

## 2012-02-05 NOTE — Progress Notes (Signed)
Physical Therapy Treatment Patient Details Name: [name] MRN: [id number] DOB: [birth year] Today's Date: 02/05/2012 Time: 6578-4696 PT Time Calculation (min): 37 min  PT Assessment / Plan / Recommendation Comments on Treatment Session  Pt. s/p CABG (01/22/12) and more recent COPD exacerbation/possible pneumonia.  Currently pt is hoping to discharge soon and continues to show signs of improved strength and gait.  She walked 600+ feet and climbed 3 steps x2 today on RA, with minimal shortness of breath.  Pt will continue to benefit from therapy services for gait and strength.     Follow Up Recommendations  Outpatient PT (Pt reports that her son will be available 24/7 to assist )     Does the patient have the potential to tolerate intense rehabilitation     Barriers to Discharge        Equipment Recommendations  None recommended by PT    Recommendations for Other Services    Frequency Min 3X/week   Plan Discharge plan remains appropriate;Frequency needs to be updated    Precautions / Restrictions Precautions Precautions: Fall;Sternal Precaution Comments: Pt demonstrates understanding and compliance with sternal precautions, falls risk due to slowed gait and decreased strength   Pertinent Vitals/Pain HR: 75bpm at rest, 89bpm with activity O2 sats: 95% at rest, >90% with activity, returned to 90-92% after activity; nurse notified.  Some general pain.     Mobility  Bed Mobility Bed Mobility: Rolling Left;Sit to Sidelying Right Rolling Left: 7: Independent Sit to Sidelying Right: 7: Independent Transfers Transfers: Sit to Stand;Stand to Sit;Stand Pivot Transfers Sit to Stand: 6: Modified independent (Device/Increase time);With upper extremity assist;From chair/3-in-1 (Pt. used momentum to stand from chair) Stand to Sit: 6: Modified independent (Device/Increase time);To bed;To chair/3-in-1;Without upper extremity assist Stand Pivot Transfers: 4: Min assist (hand held assist;  pt=90%) Details for Transfer Assistance: Pt frustrated because she felt she could stand up better than she did today.   Ambulation/Gait Ambulation/Gait Assistance: 5: Supervision Ambulation Distance (Feet): 600 Feet Assistive device: Rolling walker Ambulation/Gait Assistance Details: Pt walked well with rolling walker, was able to stop and reach for her folley to fix it's placement on the walker.  She could hold a conversation for ~163ft without loss of balance, but then needed to stop due to shortness of breath.  Slowed gait with looking right and left.   Gait Pattern: Step-through pattern;Trunk flexed Gait velocity: Slowed but controlled, pt aware of her surroundings and limitations.   Stairs: Yes Stairs Assistance: 4: Min IT consultant Details (indicate cue type and reason): Stair practice to increase strength and prepare for discharge to her son's home where she will have to navigate at least 2 steps to enter the home.  Pt used rail in R hand ; lead with R leg on the way up and L leg on the way down.  Stair Management Technique: One rail Right Number of Stairs: 6  (3 stairs x2) Wheelchair Mobility Wheelchair Mobility: No              PT Goals Acute Rehab PT Goals PT Goal: Stand to Sit - Progress: Progressing toward goal PT Goal: Ambulate - Progress: Progressing toward goal  Visit Information  Last PT Received On: 02/05/12    Subjective Data  Subjective: "I remember you, you are one of those therapy girls. Honey, I'm ready, lets get up and walk."  Patient Stated Goal: To return home   Cognition  Overall Cognitive Status: Appears within functional limits for tasks assessed/performed Arousal/Alertness: Awake/alert  Orientation Level: Appears intact for tasks assessed Behavior During Session: [facility] for tasks performed    Balance  Balance Balance Assessed: No Dynamic Gait Index Level Surface: Mild Impairment Gait with Horizontal Head Turns: Mild Impairment Gait with  Vertical Head Turns: Mild Impairment Steps: Moderate Impairment  End of Session PT - End of Session Equipment Utilized During Treatment: Gait belt Activity Tolerance: Patient tolerated treatment well Patient left: in bed;with call bell/phone within reach Nurse Communication: Mobility status;Other (comment) (O2 sats)       Sharion Balloon 02/05/2012, 3:58 PM  Sharion Balloon, SPT Acute Rehab Services [phone]

## 2012-02-05 NOTE — Progress Notes (Signed)
CARDIAC REHAB PHASE I   PRE:  Rate/Rhythm: 86SR  BP:  Supine:   Sitting: 100/50  Standing:    SaO2: 92%2l, 92%RA  MODE:  Ambulation: 550 ft   POST:  Rate/Rhythem: 89  BP:  Supine:   Sitting: 126/64  Standing:    SaO2: 91-92%RA whole walk, left off oxygen in room and notified pt's RN [phone] Pt walked 550 ft on RA with rollator and minimal asst. Monitored sats whole walk and 91-92%ra. Pt tolerated well and was happy she did so well on RA. Stated she had not used home oxygen in years. Notified case manager that pt would like rollator for home use. Pt did not have ed material from last admission so I rewrote ex ed, gave [employer] Phase 2 brochure, and gave diet info. Left off oxygen and notified RN.  Duanne Limerick

## 2012-02-05 NOTE — Progress Notes (Signed)
Have reviewed and agree with the below note. 02/05/2012  Ixonia Bing, PT [phone] (pager)

## 2012-02-06 LAB — BASIC METABOLIC PANEL
BUN: 18 mg/dL (ref 6–23)
Calcium: 9.2 mg/dL (ref 8.4–10.5)
Creatinine, Ser: 0.79 mg/dL (ref 0.50–1.10)
GFR calc Af Amer: >90 mL/min (ref >90)
GFR calc non Af Amer: 78 mL/min — ABNORMAL LOW (ref >90)
Potassium: 3.7 mEq/L (ref 3.5–5.1)

## 2012-02-06 NOTE — Progress Notes (Signed)
CARDIAC REHAB PHASE I   PRE:  Rate/Rhythm: 81SR  BP:  Supine:   Sitting: 120/60  Standing:    SaO2: 90%RA  MODE:  Ambulation: 690 ft   POST:  Rate/Rhythem: 94  BP:  Supine:   Sitting: 130/70  Standing:    SaO2: 92-95%RA 1017-1100 Pt walked 690 ft on RA with rolling walker and minimal asst. Checked sats frequently during walk and above 90% whole walk. Tolerated well. To recliner after walk.   [name]

## 2012-02-06 NOTE — Progress Notes (Signed)
Patient ID: [name], female   DOB: October 15, 1931, 78 y.o.   MRN: [id number]     Subjective:   Coughing more, productive.  Oxygen saturation good (95% on RA this morning).  Walked with PT yesterday.    Objective:   Temp:  [98.6 F (37 C)-99.2 F (37.3 C)] 99.2 F (37.3 C) (11/06 0507) Pulse Rate:  [76-82] 79  (11/06 0507) Resp:  [18-20] 20  (11/06 0507) BP: (109-128)/(65-73) 128/73 mmHg (11/06 0507) SpO2:  [88 %-99 %] 88 % (11/06 0800) FiO2 (%):  [36 %] 36 % (11/05 0859) Weight:  [132 lb 4.8 oz (60.011 kg)] 132 lb 4.8 oz (60.011 kg) (11/06 0507) Last BM Date: 02/05/12  Filed Weights   02/04/12 0500 02/05/12 0412 02/06/12 0507  Weight: 132 lb 15 oz (60.3 kg) 137 lb 1.6 oz (62.188 kg) 132 lb 4.8 oz (60.011 kg)    Intake/Output Summary (Last 24 hours) at 02/06/12 0813 Last data filed at 02/06/12 0511  Gross per 24 hour  Intake    720 ml  Output   1650 ml  Net   -930 ml   Telemetry: Sinus rhythm and sinus tachycardia.  Exam:  General: Elderly woman, no distress  Neck: JVP 7 cm  Lungs:  Decreased breath sounds at bases, occasional rhonchi.   Cardiac: Distant regular heart sounds.  Thorax: Well healing sternal incision.  Extremities: No pitting edema.   Lab Results:  Basic Metabolic Panel:  Lab 02/06/12 4098 02/05/12 0455 02/04/12 0432  NA 136 138 139  K 3.7 4.1 4.2  CL 98 98 97  CO2 31 33* 35*  GLUCOSE 104* 108* 105*  BUN 18 23 19   CREATININE 0.79 0.92 0.88  CALCIUM 9.2 9.3 9.2  MG -- -- --    CBC:  Lab 02/05/12 0455 02/04/12 0432 02/02/12 1136  WBC 10.1 10.5 10.8*  HGB 9.8* 9.6* 9.7*  HCT 30.0* 29.2* 29.8*  MCV 93.2 93.6 93.7  PLT 350 333 336    CXR (11/4): Findings:Moderate right, small left persistent pleural effusions  Medications:   Scheduled Medications:    . albuterol  2.5 mg Nebulization Q6H  . aspirin EC  81 mg Oral Daily  . atorvastatin  40 mg Oral QPC supper  . bisoprolol  5 mg Oral Daily  . docusate sodium  100 mg Oral  BID  . furosemide  60 mg Intravenous Daily  . guaiFENesin  600 mg Oral BID  . heparin  5,000 Units Subcutaneous Q8H  . ipratropium  0.5 mg Nebulization Q6H  . levofloxacin  750 mg Oral Q48H  . multivitamin with minerals  1 tablet Oral Daily  . OxyCODONE  20 mg Oral Q12H  . potassium chloride  20 mEq Oral Daily  . sodium chloride  3 mL Intravenous Q12H  . [DISCONTINUED] vancomycin  750 mg Intravenous Q24H    Infusions:    PRN Medications: acetaminophen, acetaminophen, ALPRAZolam, bisacodyl, ondansetron (ZOFRAN) IV, ondansetron, oxyCODONE, sodium chloride, [DISCONTINUED] sodium chloride   Assessment/Plan:   1. Respiratory failure:  Admitted in transfer from [facility] with respiratory distress, reportedly improved with BiPAP and diuresis. Reports recent cough, chest x-ray without definite infiltrate however. She is being treated for possible pneumonia/COPD exacerbation.  She is currently on levofloxacin.  Can stop vancomycin, will continue levofloxacin.  Also treating for component of acute/chronic diastolic CHF.  Repeat CXR monday showed persistent bilateral pleural effusions, moderate right/small left.  She is now off oxygen.   2. COPD: cannot locate preoperative PFTs at  this time. COPD exacerbation suspected. On atrovent/albuterol nebs.  Holding off on steroids for now with recent cardiac surgery.   3. CHF: Suspect component of acute/chronic diastolic CHF.  EF preserved on echo yesterday.  Continue IV Lasix as ordered for another day.  Volume status looks better.  Will change over to po Lasix.   4. Multivessel CAD status post CABG with LIMA to the LAD, SVG to the diagonal, SVG to the circumflex, and SVG to the RCA on 10/22 per Dr. Tyrone Sage.   5. Anemia: Stable.   5. Hypertension.  6. Disposition -   Will check back on her after lunch.  If she is feeling ok, will plan to discharge today.  Will need to go home with son.  Will need home PT and home health RN.  Will need followup with me in  1 week with BMET.  Meds for discharge: ASA 325, atorvastatin 40, bisoprolol 5, levofloxacin 500 mg daily to complete total 10 day course, KCl 20, Lasix 40 mg bid x 3 days then 40 mg daily, ramipril 5 mg daily, spiriva inhaler, nebulizer machine for albuterol nebs q4 hrs prn.  She can get her home pain meds/anxiolytics as prior to admission.   Marca Ancona 02/06/2012 8:13 AM

## 2012-02-06 NOTE — Progress Notes (Signed)
Pt walked 750 ft with 1xA and walker. Pt said upon return to room that she felt short winded. O2 sat was 91 on RA. Will continue to monitor pt.

## 2012-02-06 NOTE — Progress Notes (Signed)
Pt apprehensive to go home today, feels "weak." Wants to stay another day - discussed earlier with Dr. Shirlee Latch who felt she was OK to keep overnight for observation. Will have her walk again with cardiac rehab in AM and see how she does -- anticipate DC in AM.  Ronie Spies PA-C

## 2012-02-06 NOTE — Care Management Note (Addendum)
    Page 1 of 2   02/07/2012     4:41:49 PM   CARE MANAGEMENT NOTE 02/07/2012  Patient:  [facility]   Account Number:  [id number]  Date Initiated:  02/05/2012  Documentation initiated by:  Penelope Fittro  Subjective/Objective Assessment:   PT ADM ON 02/02/12 FROM [facility] WITH RESPIRATORY FAILURE.     Action/Plan:   MET WITH PT TO DISCUSS DC PLANS.  PT PLANS TO RETURN HOME WITH FAMILY AT DC.  SHE WILL NEED HOME HEALTH FOLLOW UP AT DISCHARGE, AND WISHES TO USE [facility], AS SHE HAS USED THEM IN THE PAST.   Anticipated DC Date:  02/06/2012   Anticipated DC Plan:  HOME W HOME HEALTH SERVICES      DC Planning Services  CM consult      [facility] Choice  HOME HEALTH   Choice offered to / List presented to:  C-1 Patient   DME arranged  WALKER - ROLLING  OXYGEN      DME agency  [facility]     HH arranged  HH-1 RN  HH-2 PT      [facility] agency  [facility]   Status of service:  Completed, signed off Medicare Important Message given?   (If response is "NO", the following Medicare IM given date fields will be blank) Date Medicare IM given:   Date Additional Medicare IM given:    Discharge Disposition:  HOME W HOME HEALTH SERVICES  Per UR Regulation:  Reviewed for med. necessity/level of care/duration of stay  If discussed at Long Length of Stay Meetings, dates discussed:    Comments:  02/07/12 Cathan Gearin,RN,BSN [phone] PT WILL NEED HOME O2--SHE HAS HAD IN THE PAST THROUGH [facility], BUT STATES CONCENTRATOR IS 78 YEARS OLD, AND SHE ONLY HAS EMPTY TANKS AT HOME.  PER [facility], SHE WILL NEED NEW SET UP. JUSTIN WITH [facility] DELIVERED PORTABLE TANK TO PT PRIOR TO DC. PT PLANS TO DC TO DAUGHTER IN LAW'S HOME AT DC, ADDRESS [phone], [place], [place] [address].  PHONE # [phone].  [facility] AWARE OF ADDRESS.  02/06/12 Nickie Retort, BSN [phone] PT REQUESTED A ROLLATOR FOR HOME (WALKER WITH SEAT). UNFORTUNATELY, MEDICARE WILL NOT COVER, AS PT HAS OBTAINED RW  THROUGH [facility] WITHIN THE LAST 5 YEARS (2009).  WILL LET PT KNOW ABOUT THIS; COST OF ROLLATOR AS PRIVATE PAY IS $140.

## 2012-02-07 ENCOUNTER — Encounter (HOSPITAL_COMMUNITY): Payer: Self-pay | Admitting: Physician Assistant

## 2012-02-07 ENCOUNTER — Telehealth: Payer: Self-pay | Admitting: Pharmacist

## 2012-02-07 ENCOUNTER — Other Ambulatory Visit: Payer: Self-pay | Admitting: *Deleted

## 2012-02-07 DIAGNOSIS — I10 Essential (primary) hypertension: Secondary | ICD-10-CM

## 2012-02-07 DIAGNOSIS — I2510 Atherosclerotic heart disease of native coronary artery without angina pectoris: Secondary | ICD-10-CM

## 2012-02-07 DIAGNOSIS — E785 Hyperlipidemia, unspecified: Secondary | ICD-10-CM

## 2012-02-07 LAB — BASIC METABOLIC PANEL
CO2: 29 mEq/L (ref 19–32)
Chloride: 98 mEq/L (ref 96–112)
Creatinine, Ser: 0.88 mg/dL (ref 0.50–1.10)
GFR calc Af Amer: 71 mL/min — ABNORMAL LOW (ref >90)
Potassium: 4.1 mEq/L (ref 3.5–5.1)

## 2012-02-07 LAB — CULTURE, BLOOD (SINGLE)

## 2012-02-07 MED ORDER — BISOPROLOL FUMARATE 5 MG PO TABS: 5.0000 mg | ORAL_TABLET | Freq: Every day | ORAL | Status: DC

## 2012-02-07 MED ORDER — FUROSEMIDE 40 MG PO TABS: 40.0000 mg | ORAL_TABLET | ORAL | Status: DC

## 2012-02-07 MED ORDER — LEVOFLOXACIN 500 MG PO TABS: 500.0000 mg | ORAL_TABLET | Freq: Every day | ORAL | Status: AC

## 2012-02-07 MED ORDER — ALBUTEROL SULFATE (5 MG/ML) 0.5% IN NEBU: 2.5000 mg | INHALATION_SOLUTION | RESPIRATORY_TRACT | Status: DC | PRN

## 2012-02-07 MED ORDER — LEVOFLOXACIN 500 MG PO TABS: 500.0000 mg | ORAL_TABLET | Freq: Every day | ORAL | Status: DC

## 2012-02-07 MED ORDER — RAMIPRIL 5 MG PO CAPS: 5.0000 mg | ORAL_CAPSULE | Freq: Every day | ORAL | Status: DC

## 2012-02-07 MED ORDER — TIOTROPIUM BROMIDE MONOHYDRATE 18 MCG IN CAPS: 18.0000 ug | ORAL_CAPSULE | Freq: Every day | RESPIRATORY_TRACT | Status: DC

## 2012-02-07 NOTE — Progress Notes (Signed)
CARDIAC REHAB PHASE I   PRE:  Rate/Rhythm: 86SR  BP:  Supine:   Sitting: 102/60  Standing:    SaO2: 94%1L,90%RA  MODE:  Ambulation: 350 ft   POST:  Rate/Rhythem: 93  BP:  Supine:   Sitting: 110/60  Standing:    SaO2: 89-93%RA whole walk. [id number] Pt appears more SOB with walk this morning. Pt cut walk short. Monitored sats the whole walk and 89-93%RA. Pt able to walk 690 ft yesterday and tolerated better. To recliner after walk with call bell. Put oxygen back on at pt's request at 1L.  Duanne Limerick

## 2012-02-07 NOTE — Progress Notes (Signed)
[name],PT Acute Rehabilitation [phone] (pager)  

## 2012-02-07 NOTE — Progress Notes (Signed)
Physical Therapy Treatment Patient Details Name: [name] MRN: [id number] DOB: 06-10-1931 Today's Date: 02/07/2012 Time: 4098-1191 PT Time Calculation (min): 26 min  PT Assessment / Plan / Recommendation Comments on Treatment Session  Pt. s/p CABG (01/22/12) and more recent COPD exacerbation.  Pt was hoping to discharge after last visit, however felt weak and wanted to stay in the hospital another day.  Today pt reported she feels stronger and that is was her COPD acting up the other day.  She was eager to walk with PT today and walked very well; 600+ feet, walking close to the walker and with good gait velocity.  She climbed a flight of stairs (~12steps) with a standing break at the top of the flight.      Follow Up Recommendations  Outpatient PT     Does the patient have the potential to tolerate intense rehabilitation     Barriers to Discharge        Equipment Recommendations  None recommended by PT    Recommendations for Other Services    Frequency Min 3X/week   Plan Discharge plan remains appropriate;Frequency remains appropriate    Precautions / Restrictions Precautions Precautions: Sternal;Fall   Pertinent Vitals/Pain HR and O2 sats closely monitored throughout activity on RA. HR at rest 78, with activity increased to 102.  O2 sats at rest 95%, with walking and talking 88%, with walking and no talking 93%, with stairs and no talking 94%.    Mobility  Bed Mobility Bed Mobility: Rolling Right;Right Sidelying to Sit;Sitting - Scoot to Edge of Bed Rolling Right: 7: Independent Right Sidelying to Sit: 7: Independent Sitting - Scoot to Edge of Bed: 7: Independent Transfers Transfers: Sit to Stand;Stand to Sit Sit to Stand: 6: Modified independent (Device/Increase time);From bed Stand to Sit: 6: Modified independent (Device/Increase time);To chair/3-in-1 Details for Transfer Assistance: Pt was able to stand much easier and demonstrated controlled sitting; however  still required min use of UE for balance and took increased time standing.  Ambulation/Gait Ambulation/Gait Assistance: 5: Supervision (due to shortness of breath; however pt monitored self well) Ambulation Distance (Feet):  (600+) Assistive device: Rolling walker Ambulation/Gait Assistance Details: Pt ambulated well and with improved speed, however became fatigued/short of breath quickly when walking and talking at the same time.  She demonstrated good manipulation of the RW around objects, through a doorway, and to /from the stairs.  Gait Pattern: Step-through pattern;Trunk flexed Gait velocity: slowed General Gait Details: pt able to look at her surroundings without loss of balance.  Stairs: Yes Stairs Assistance: 4: Min IT consultant Details (indicate cue type and reason): Pt climbed one flight of stairs well, breathing and focusing on the task at hand she was able to maintain her O2 sats at 94%.  Pt went up and down forward.  Stair Management Technique: One rail Right (one rail up; rail and hand held assist down.) Number of Stairs: 12  Wheelchair Mobility Wheelchair Mobility: No      PT Goals Acute Rehab PT Goals PT Goal: Stand to Sit - Progress: Progressing toward goal PT Goal: Ambulate - Progress: Progressing toward goal  Visit Information  Last PT Received On: 02/07/12 Assistance Needed: +1    Subjective Data  Subjective: "I am feeling stronger today."  When asked why she didn't leave the other day when she planned, pt states "I took a turn for the worse, with my COPD, but i'm feeling better today."  Patient Stated Goal: to return home  Cognition  Overall Cognitive Status: Appears within functional limits for tasks assessed/performed Arousal/Alertness: Awake/alert Orientation Level: Appears intact for tasks assessed Behavior During Session: [facility] for tasks performed Cognition - Other Comments: Able to recognize mistakes and correct during activity; specifically  corrected her position within RW to walk closer.     Balance  High Level Balance High Level Balance Comments: Balance on two feet without UE support was [facility]; pt able to maintain balance while don/doffing second gown.   End of Session PT - End of Session Equipment Utilized During Treatment: Gait belt Activity Tolerance: Patient tolerated treatment well Patient left: in chair;with call bell/phone within reach Nurse Communication: Mobility status       Sharion Balloon 02/07/2012, 2:15 PM  Sharion Balloon, SPT Acute Rehab Services [phone]

## 2012-02-07 NOTE — Progress Notes (Signed)
Patient ID: [name], female   DOB: 1931/11/08, 78 y.o.   MRN: [id number]     Subjective:   Feeling better today.  Still some productive cough. Walked with PT.  Objective:   Temp:  [98.6 F (37 C)-99.5 F (37.5 C)] 98.8 F (37.1 C) (11/07 0529) Pulse Rate:  [71-85] 78  (11/07 0529) Resp:  [18-22] 19  (11/07 0529) BP: (116-130)/(69-83) 123/83 mmHg (11/07 0529) SpO2:  [88 %-93 %] 92 % (11/07 0529) Last BM Date: 02/06/12  Filed Weights   02/04/12 0500 02/05/12 0412 02/06/12 0507  Weight: 132 lb 15 oz (60.3 kg) 137 lb 1.6 oz (62.188 kg) 132 lb 4.8 oz (60.011 kg)    Intake/Output Summary (Last 24 hours) at 02/07/12 0746 Last data filed at 02/07/12 0526  Gross per 24 hour  Intake    360 ml  Output    901 ml  Net   -541 ml   Telemetry: Sinus rhythm and sinus tachycardia.  Exam:  General: Elderly woman, no distress  Neck: JVP 7 cm  Lungs: Rhonchi  Cardiac: Distant regular heart sounds.  Thorax: Well healing sternal incision.  Extremities: No pitting edema.   Lab Results:  Basic Metabolic Panel:  Lab 02/07/12 4098 02/06/12 0445 02/05/12 0455  NA 136 136 138  K 4.1 3.7 4.1  CL 98 98 98  CO2 29 31 33*  GLUCOSE 102* 104* 108*  BUN 22 18 23   CREATININE 0.88 0.79 0.92  CALCIUM 9.4 9.2 9.3  MG -- -- --    CBC:  Lab 02/05/12 0455 02/04/12 0432 02/02/12 1136  WBC 10.1 10.5 10.8*  HGB 9.8* 9.6* 9.7*  HCT 30.0* 29.2* 29.8*  MCV 93.2 93.6 93.7  PLT 350 333 336    CXR (11/4): Findings:Moderate right, small left persistent pleural effusions  Medications:   Scheduled Medications:    . albuterol  2.5 mg Nebulization Q6H  . aspirin EC  81 mg Oral Daily  . atorvastatin  40 mg Oral QPC supper  . bisoprolol  5 mg Oral Daily  . docusate sodium  100 mg Oral BID  . furosemide  60 mg Intravenous Daily  . guaiFENesin  600 mg Oral BID  . heparin  5,000 Units Subcutaneous Q8H  . ipratropium  0.5 mg Nebulization Q6H  . levofloxacin  750 mg Oral Q48H  .  multivitamin with minerals  1 tablet Oral Daily  . OxyCODONE  20 mg Oral Q12H  . potassium chloride  20 mEq Oral Daily  . sodium chloride  3 mL Intravenous Q12H  . [DISCONTINUED] vancomycin  750 mg Intravenous Q24H    Infusions:    PRN Medications: acetaminophen, acetaminophen, ALPRAZolam, bisacodyl, ondansetron (ZOFRAN) IV, ondansetron, oxyCODONE, sodium chloride   Assessment/Plan:   1. Respiratory failure:  Admitted in transfer from [facility] with respiratory distress, reportedly improved with BiPAP and diuresis. Reports recent cough, chest x-ray without definite infiltrate however. She is being treated for possible pneumonia/COPD exacerbation.  She is currently on levofloxacin.  Also treating for component of acute/chronic diastolic CHF.  Repeat CXR monday showed persistent bilateral pleural effusions, moderate right/small left. Oxygen saturation in the low 90s on room air.    2. COPD: cannot locate preoperative PFTs at this time. COPD exacerbation suspected. On atrovent/albuterol nebs.  Holding off on steroids for now with recent cardiac surgery.   3. CHF: Suspect component of acute/chronic diastolic CHF.  EF preserved on echo yesterday.  Volume status better with IV lasix.   4. Multivessel  CAD status post CABG with LIMA to the LAD, SVG to the diagonal, SVG to the circumflex, and SVG to the RCA on 10/22 per Dr. Tyrone Sage.   5. Anemia: Stable.   5. Hypertension.  6. Disposition -   Discharge home today. Will need to go home with son.  Will need home PT and home health RN.  Will need followup with me in 1 week with [facility].  Meds for discharge: ASA 325, atorvastatin 40, bisoprolol 5, levofloxacin 500 mg daily to complete total 10 day course (counting time in hospital), KCl 20, Lasix 40 mg bid x 3 days then 40 mg daily, ramipril 5 mg daily, spiriva inhaler, nebulizer machine for albuterol nebs q4 hrs prn.  She can get her home pain meds/anxiolytics as prior to admission.   Marca Ancona 02/07/2012 7:46 AM

## 2012-02-07 NOTE — Discharge Summary (Signed)
Discharge Summary   Patient ID: [name] MRN: [id number], DOB/AGE: [birth year] 78 y.o. Admit date: 02/02/2012 D/C date:     02/07/2012  Primary Cardiologist: Shirlee Latch  Primary Discharge Diagnoses:  1. Acute respiratory failure 2. Acute COPD exacerbation 3. Bilateral pleural effusions 4. Acute on chronic diastolic CHF 5. CAD s/p CABG 01/22/2012 (recent) - hx of NSTEMI 11/2010 6. Anemia 7. HTN  Secondary Discharge Diagnoses:  1. Rheumatoid arthritis 2. Diabetes mellitus  3. Osteoporosis 4. Hyperlipemia 5. Diverticulosis of colon  6. GIB 04/2011 felt diverticular requiring microembolization by vascular surgery.  7. Anxiety and depression  8. AVM of GI tract 9. Fatty liver 10. Chronic back pain 11. Splenic artery aneurysm 12. Excision of adrenal mass in 2007 13. RLS 14. C diff 04/2008, severe  Hospital Course: [name] is a 78 y/o F with hx of COPD, anxiety/depression, DM, and recent CABG 01/2012 who presented initially to [facility] then was transferred to [facility] with respiratory distress requiring bipap. She had had a productive thick white cough. No fever, chills, nausea vomiting. CXR demonstrated bibasilar atelectasis and small effusions. It was felt her respiratory failure may be multifactorial from likely COPD exacerbation, diastolic CHF, and her pleural effusions. She was started on diuresis and antibiotics. She was able to be de-escalated to nasal canula. Metoprolol was changed to bisoprolol. She was not started on steroids given recent cardiac surgery. Her antibiotic coverage was narrowed to levaquin (which has been dosed q24hr given very mild decreased CrCl). She was seen by PT who recommended outpt PT. 2D Echo 11/4 showed mild LVH & mild focal basal hypertrophy of the septum, EF 55-60%, no RWMA, grade 1 diastolic dysfunction, ASA, and PA pressure . She continued to improve with the therapies implemented and was able to come off oxygen and maintain an O2 sat>90% with ambulation.  She is feeling better today. Dr. Shirlee Latch has seen and examined the patient and feels she is stable for discharge. She will have rollator walker, HHPT and RN, neb machine arranged at discharge. I discussed her cardiac rehab status with Dr. Shirlee Latch. He would like her to use oxygen at night and with exertion - she already has an oxygen tank preexisting at home but has not been using it. She will complete 10 day course of Levaquin (has allergy listed to Cipro but has been consistently tolerating Levaquin in the hospital).  Discharge Vitals: Blood pressure 123/83, pulse 78, temperature 98.8 F (37.1 C), temperature source Oral, resp. rate 19, height 5' (1.524 m), weight 132 lb 4.8 oz (60.011 kg), SpO2 89.00%.  Labs: Lab Results  Component Value Date   WBC 10.1 02/05/2012   HGB 9.8* 02/05/2012   HCT 30.0* 02/05/2012   MCV 93.2 02/05/2012   PLT 350 02/05/2012     Lab 02/07/12 0510  NA 136  K 4.1  CL 98  CO2 29  BUN 22  CREATININE 0.88  CALCIUM 9.4  PROT --  BILITOT --  ALKPHOS --  ALT --  AST --  GLUCOSE 102*    Lab Results  Component Value Date   CHOL 100 02/08/2011   HDL 32* 10/29/2011   LDLCALC 73 10/29/2011   TRIG 85 10/29/2011     Diagnostic Studies/Procedures    2D Echo 02/04/12 Study Conclusions - Left ventricle: The cavity size was normal. Wall thickness was increased in a pattern of mild LVH. There was mild focal basal hypertrophy of the septum. Systolic function was normal. The estimated ejection fraction was in the  range of 55% to 60%. Wall motion was normal; there were no regional wall motion abnormalities. Doppler parameters are consistent with abnormal left ventricular relaxation (grade 1 diastolic dysfunction). - Mitral valve: Calcified annulus. - Left atrium: The atrium was mildly dilated. - Atrial septum: There was an atrial septal aneurysm. - Pulmonary arteries: Systolic pressure was mildly increased. PA peak pressure: 38mm Hg (S).  Dg Chest 2 View 02/04/2012   *RADIOLOGY REPORT*  Clinical Data: Pneumonia, weakness, cough.  CHEST - 2 VIEW  Comparison: 02/02/2012  Findings: Previous CABG.  Dorsal stimulator catheter leads project in stable location.  Bilateral shoulder arthroplasty hardware is partially seen.  Persistent moderate right and smaller left pleural effusions.  Associated atelectasis versus consolidation in the basilar segments of the lower lobes.  Heart size upper limits normal.  No overt interstitial edema.  Patchy aortic calcifications.  Spondylitic changes in the lower thoracic spine.  IMPRESSION:  1.  Persistent moderate right and smaller left pleural effusions. 2.  Stable postoperative changes.   Original Report Authenticated By: D. Andria Rhein, MD     Discharge Medications   Current Discharge Medication List    START taking these medications   Details  albuterol (PROVENTIL) (5 MG/ML) 0.5% nebulizer solution Take 0.5 mLs (2.5 mg total) by nebulization every 4 (four) hours as needed for wheezing or shortness of breath. Qty: 20 mL, Refills: 1   Comments: You should follow up with your primary doctor or lung doctor for refills of this medicine.    bisoprolol (ZEBETA) 5 MG tablet Take 1 tablet (5 mg total) by mouth daily. Qty: 30 tablet, Refills: 6    levofloxacin (LEVAQUIN) 500 MG tablet Take 1 tablet (500 mg total) by mouth daily. Qty: 6 tablet, Refills: 0    tiotropium (SPIRIVA HANDIHALER) 18 MCG inhalation capsule Place 1 capsule (18 mcg total) into inhaler and inhale daily. Qty: 30 capsule, Refills: 1   Comments: You should follow up with your primary doctor or lung doctor for refills of this medicine.      CONTINUE these medications which have CHANGED   Details  furosemide (LASIX) 40 MG tablet Take 1 tablet (40 mg total) by mouth as directed. Take 1 tablet by mouth twice daily for 3 days, then decrease to 1 tablet daily. Qty: 30 tablet, Refills: 6    ramipril (ALTACE) 5 MG capsule Take 1 capsule (5 mg total) by mouth  daily. Qty: 30 capsule, Refills: 6      CONTINUE these medications which have NOT CHANGED   Details  !! ALPRAZolam (XANAX) 0.25 MG tablet Take 1 tablet (0.25 mg total) by mouth 2 (two) times daily as needed for anxiety. Qty: 30 tablet, Refills: 0    !! ALPRAZolam (XANAX) 0.5 MG tablet Take 0.5 mg by mouth at bedtime as needed. For sleep    aspirin EC 325 MG EC tablet Take 1 tablet (325 mg total) by mouth daily. Qty: 30 tablet    atorvastatin (LIPITOR) 40 MG tablet Take 1 tablet (40 mg total) by mouth daily.   Associated Diagnoses: Coronary atherosclerosis of unspecified type of vessel, native or graft; Other and unspecified hyperlipidemia    fish oil-omega-3 fatty acids 1000 MG capsule Take 1 g by mouth daily.    guaiFENesin (MUCINEX) 600 MG 12 hr tablet Take 600 mg by mouth 2 (two) times daily.     Multiple Vitamin (MULTIVITAMIN WITH MINERALS) TABS Take 1 tablet by mouth daily.    oxyCODONE (OXY IR/ROXICODONE) 5 MG immediate release  tablet Take 1-2 tablets (5-10 mg total) by mouth every 6 (six) hours as needed. Qty: 50 tablet, Refills: 0    oxyCODONE (OXYCONTIN) 20 MG 12 hr tablet Take 20 mg by mouth every 6 (six) hours.    potassium chloride (K-DUR) 10 MEQ tablet Take 2 tablets (20 mEq total) by mouth daily. Qty: 7 tablet, Refills: 0    saccharomyces boulardii (FLORASTOR) 250 MG capsule Take 250 mg by mouth daily.    Saline (OCEAN NASAL SPRAY NA) Place 1-2 sprays into the nose daily as needed. For dry nasal passages     !! - Potential duplicate medications found. Please discuss with provider.    STOP taking these medications     amoxicillin-clavulanate (AUGMENTIN) 875-125 MG per tablet Comments:  Reason for Stopping:       metoprolol tartrate (LOPRESSOR) 25 MG tablet Comments:  Reason for Stopping:          Disposition   The patient will be discharged in stable condition to home. Discharge Orders    Future Appointments: Provider: Department: Dept Phone: Center:    02/15/2012 9:00 AM Rosalio Macadamia, NP [facility] Main Office [place]) [phone] [facility]   02/15/2012 9:55 AM [facility] [employer] Main Office [place]) [phone] [facility]   02/21/2012 4:30 PM Delight Ovens, MD [facility] [phone] [facility]     Future Orders Please Complete By Expires   Diet - low sodium heart healthy      Increase activity slowly      Comments:   Dr. Shirlee Latch would like you to use the oxygen that you have at home both at night and with exertion (1-2 liters/min).     Follow-up Information    Follow up with Norma Fredrickson, NP. (02/15/12 at 9am - nurse practitioner at Dr. Alford Highland office. You will also have labwork at that visit.)    Contact information:   [address] [place] [address] [phone]       Follow up with GERHARDT,EDWARD B, MD. (02/21/12 at 4:30pm)    Contact information:   [address] [place] [address] [phone]       Follow up with Hannah Beat, MD. (Schedule appointment with primary care doctor to discuss further evaluation of your COPD)    Contact information:   [address] [place] [address] [place] [address] [phone]            Duration of Discharge Encounter: Greater than 30 minutes including physician and PA time.  Signed, Kriste Basque Dunn PA-C 02/07/2012, 9:07 AM

## 2012-02-07 NOTE — Progress Notes (Signed)
SATURATION QUALIFICATIONS:  Patient Saturations on Room Air at Rest =93% Patient Saturations on [employer] while Ambulating =84-88 %  Patient Saturations on2  Liters of oxygen while Ambulating =95 %  Statement of medical necessity for home oxygen:COPD

## 2012-02-07 NOTE — Telephone Encounter (Signed)
Message copied by Velda Shell on Thu Feb 07, 2012 10:50 AM ------      Message from: Lorne Skeens C      Created: Thu Feb 07, 2012  8:31 AM       Kennon Rounds, transition of care appt with Lawson Fiscal on 11/15                         gesila

## 2012-02-08 DIAGNOSIS — I5033 Acute on chronic diastolic (congestive) heart failure: Secondary | ICD-10-CM

## 2012-02-08 DIAGNOSIS — I2510 Atherosclerotic heart disease of native coronary artery without angina pectoris: Secondary | ICD-10-CM

## 2012-02-08 DIAGNOSIS — I10 Essential (primary) hypertension: Secondary | ICD-10-CM

## 2012-02-08 DIAGNOSIS — J449 Chronic obstructive pulmonary disease, unspecified: Secondary | ICD-10-CM

## 2012-02-08 NOTE — Telephone Encounter (Signed)
Patient called stated she feels better.Stated Home Health nurse just left.States she understands discharge instructions and medications.Advised to keep appointment 02/15/12 with Norma Fredrickson NP.

## 2012-02-11 NOTE — H&P (Signed)
 ADMISSION HISTORY AND PHYSICAL   Date: 02/02/2012               Patient Name:  [name] MRN: [id number]  DOB: 06/19/1931 Age / Sex: 78 y.o., female        PCP: Spencer Copland Primary Cardiologist: Dalton McLean, MD         History of Present Illness: Patient is a 78 y.o. female with a PMHx of COPD, HTN, diabetes, CAD and recent CABG , who was admitted to [facility] on 02/02/2012 for evaluation of severe respiratory failure.  [name] is a 78 yo with the above noted medical hx.   She was just DCd from [facility] on 10/29 after having CABG.  She presented to [facility] ER with severe dyspnea and was transferred to [facility] for further eval.  She was on BIPAP when she arrived.  She has been decreased to 5 l [place] at present.  She is comfortable.  She has had productive cough - thick , white . Denies any fever, chills. Dysuria, N/V, diarrhea    Medications: Outpatient medications: Prescriptions prior to admission  Medication Sig Dispense Refill  . ALPRAZolam (XANAX) 0.25 MG tablet Take 1 tablet (0.25 mg total) by mouth 2 (two) times daily as needed for anxiety.  30 tablet  0  . ALPRAZolam (XANAX) 0.5 MG tablet Take 0.5 mg by mouth at bedtime as needed. For sleep      . aspirin EC 325 MG EC tablet Take 1 tablet (325 mg total) by mouth daily.  30 tablet    . atorvastatin (LIPITOR) 40 MG tablet Take 1 tablet (40 mg total) by mouth daily.      . fish oil-omega-3 fatty acids 1000 MG capsule Take 1 g by mouth daily.      . furosemide (LASIX) 40 MG tablet Take 1 tablet (40 mg total) by mouth daily.  7 tablet  0  . guaiFENesin (MUCINEX) 600 MG 12 hr tablet Take 600 mg by mouth 2 (two) times daily.       . metoprolol tartrate (LOPRESSOR) 25 MG tablet Take 25 mg by mouth 2 (two) times daily.        . Multiple Vitamin (MULTIVITAMIN WITH MINERALS) TABS Take 1 tablet by mouth daily.      . oxyCODONE (OXY IR/ROXICODONE) 5 MG immediate release tablet Take 1-2 tablets (5-10 mg total) by mouth every 6 (six) hours as  needed.  50 tablet  0  . oxyCODONE (OXYCONTIN) 20 MG 12 hr tablet Take 20 mg by mouth every 6 (six) hours.      . potassium chloride (K-DUR) 10 MEQ tablet Take 2 tablets (20 mEq total) by mouth daily.  7 tablet  0  . Probiotic Product (HEALTHY COLON PO) Take 1 capsule by mouth daily.       . ramipril (ALTACE) 10 MG capsule Take 1 capsule (10 mg total) by mouth daily.  30 capsule  11  . Saline (OCEAN NASAL SPRAY NA) Place 1-2 sprays into the nose daily as needed. For dry nasal passages        Allergies  Allergen Reactions  . Ciprofloxacin     REACTION: UNSPECIFIED  . Iron Diarrhea  . Morphine Sulfate Nausea And Vomiting    At high doses hallicuniations  . Naproxen Rash    REACTION: UNSPECIFIED     Past Medical History  Diagnosis Date  . Hypertension 08/01/1995  . Rheumatoid arthritis 04/03/1983  . Osteoarthritis 04/03/1983  . Anemia, deficiency   07/01/2004    (w/u by Dr. Gittin)  . Diabetes mellitus type II 07/31/1997  . Osteoporosis 12/31/2000  . CAD (coronary artery disease) 09/01/2002    s/p NSTEMI 2012  . Hyperlipemia 07/02/1999  . COPD (chronic obstructive pulmonary disease) 05/31/2004  . diverticulosis of colon   . Arteriovenous malformation of gastrointestinal tract   . Fatty liver   . Chronic back pain   . History of non-ST elevation myocardial infarction (NSTEMI) 07/22/2011  . Splenic artery aneurysm   . Anxiety and depression   . Anxiety     Past Surgical History  Procedure Date  . Nasal sinus surgery   . Inguinal hernia repair   . Partial hysterectomy     ovaries intact, dysmennorhea w/ A/P repari  . Carpal tunnel release     bilat  . Trigger finger release     right  . Wrist surgery     neuroma repair, right  . Ankle surgery     neuroma repair, right ORIF  . Total shoulder replacement 12/13/1998    right, ([facility])  . Rotator cuff repair 11/22/2003    ([facility]) left  . Epidural block injection 05/2005    x 3  . Long finger tendon realignment  10/31/2005    Meyerdierks  . Lumbar epidural 06/13/2006  . Laminectomy 06/2006    L4/5 spinal stenosis (Dr. Roy)  . Laminectomy 02/24/2008    L3/4 Ant/Lat interbody fusion and Lat Artthrodesis w/plate using XLIF (Dr. Roy)  . Colonic embolization 04/18/2011    Dr. Dew, acute GI bleed  . Spinal cord stimulator implant   . Coronary artery bypass graft 01/22/2012    Procedure: CORONARY ARTERY BYPASS GRAFTING (CABG);  Surgeon: Edward B Gerhardt, MD;  Location: [facility] OR;  Service: Open Heart Surgery;  Laterality: N/A;  Coronary Artery Bypass Graft times four utilizing the left internal mammary artery and the left greater saphenous vein harvested endoscopically.  . Tee without cardioversion 01/22/2012    Procedure: TRANSESOPHAGEAL ECHOCARDIOGRAM (TEE);  Surgeon: Edward B Gerhardt, MD;  Location: [facility] OR;  Service: Open Heart Surgery;  Laterality: N/A;    Family History  Problem Relation Age of Onset  . Hypotension Mother   . Dementia Mother   . Alcohol abuse Father   . Heart disease Father     MI, enlarged heart  . Diabetes Brother     Social History:  reports that she has been smoking Cigarettes.  She has smoked for the past 62 years. She has never used smokeless tobacco. She reports that she drinks alcohol. She reports that she does not use illicit drugs.   Review of Systems: Constitutional:  denies fever, chills, diaphoresis, appetite change and fatigue.  HEENT: denies photophobia, eye pain, redness, hearing loss, ear pain, congestion, sore throat, rhinorrhea, sneezing, neck pain, neck stiffness and tinnitus.  Respiratory: admits to SOB, DOE, cough, chest tightness, and wheezing.  Cardiovascular: denies chest pain, palpitations and leg swelling.  Gastrointestinal: denies nausea, vomiting, abdominal pain, diarrhea, constipation, blood in stool.  Genitourinary: denies dysuria, urgency, frequency, hematuria, flank pain and difficulty urinating.  Musculoskeletal: admits to  chronic back pain,    Skin: denies pallor, rash and wound.  Neurological: denies dizziness, seizures, syncope, weakness, light-headedness, numbness and headaches.   Hematological: denies adenopathy, easy bruising, personal or family bleeding history.  Psychiatric/ Behavioral: denies suicidal ideation, mood changes, confusion, nervousness, sleep disturbance and agitation.    Physical Exam: BP 109/53  Pulse 95  Temp 98.7 F (37.1 C) (Oral)    Resp 24  Ht 5' (1.524 m)  Wt 135 lb (61.236 kg)  BMI 26.37 kg/m2  SpO2 98%  General: Vital signs reviewed and noted.   Head: Normocephalic, atraumatic, sclera anicteric, mucus membranes are moist  Neck: Supple. Negative for carotid bruits. JVD not elevated.  Lungs:  Coarse rhonchi and wheezes on left side. Mild wheezing right base.  Heart: RRR with S1 S2. No murmurs, rubs, or gallops .  Sternotomy is well healed.   Abdomen:  Soft, non-tender, non-distended with normoactive bowel sounds. No hepatomegaly. No rebound/guarding. No obvious abdominal masses  MSK: Strength and the appear normal for age.  Extremities: No clubbing or cyanosis. No edema.  Distal pedal pulses are 2+ and equal bilaterally.  Neurologic: Alert and oriented X 3. Moves all extremities spontaneously  Psych:  Responds to questions appropriately with a normal affect.    Lab results: Basic Metabolic Panel:  Lab 01/28/12 0610 01/27/12 0730  NA 138 139  K 3.9 2.9*  CL 100 101  CO2 31 29  GLUCOSE 106* 128*  BUN 19 16  CREATININE 0.83 0.64  CALCIUM 8.8 8.5  MG -- --  PHOS -- --    Liver Function Tests: No results found for this basename: AST:3,ALT:3,ALKPHOS:3,BILITOT:3,PROT:3,ALBUMIN:3 in the last 168 hours No results found for this basename: LIPASE:3,AMYLASE:3 in the last 168 hours  CBC:  Lab 01/28/12 0610  WBC 6.8  NEUTROABS --  HGB 10.5*  HCT 32.2*  MCV 94.7  PLT 194    Cardiac Enzymes: No results found for this basename: CKTOTAL:5,CKMB:5,CKMBINDEX:5,TROPONINI:5 in the last 168  hours  BNP: No components found with this basename: POCBNP:3  CBG:  Lab 01/26/12 2022 01/26/12 1615 01/26/12 1239  GLUCAP 155* 124* 169*    Coagulation Studies: No results found for this basename: LABPROT:3,INR:3 in the last 72 hours   Assessment & Plan:  1. Respiratory failure: patient required BIPAP at [facility].  She improved with IV lasix. She has significant rhonchi and wheezes - especially on her left lung.  I have not been able to view the CRX ( on CD ROM) sent from [facility].  We will repeat the CXR.   2. Possible pneumonia:  Will treat her as a hospital acquired pneumonia since she has been in the hospital and then SNF.    3. COPD :  Will give xopenex, Atrovent, Q 6 hrs.  4. CAD: s/p recent CABG.  5. Hx of cigarette smoking: quit 2 Chaik[place] ago  6. HTN: cont. Meds., hold ACE -inhibitor until we assure that she does not have renal insufficiency  7. Osteoarthritis: continue home meds.   DVT PPX - LDSQH   Philip J. Nahser, Jr., MD, FACC 02/02/2012, 11:00 AM         BUN  19  16   CREATININE  0.83  0.64    CALCIUM  8.8  8.5   MG  --  --   PHOS  --  --        Liver Function Tests: No results found for this basename: AST:3,ALT:3,ALKPHOS:3,BILITOT:3,PROT:3,ALBUMIN:3 in the last 168 hours No results found for this basename: LIPASE:3,AMYLASE:3 in the last 168 hours   CBC:   Lab  01/28/12 0610   WBC  6.8   NEUTROABS  --   HGB  10.5*   HCT  32.2*   MCV  94.7   PLT  194        Cardiac Enzymes: No results found for this basename: CKTOTAL:5,CKMB:5,CKMBINDEX:5,TROPONINI:5 in the last 168 hours   BNP: No components found with this basename: POCBNP:3   CBG:   Lab  01/26/12 2022  01/26/12 1615  01/26/12 1239   GLUCAP  155*  124*  169*        Coagulation Studies: No results found for this basename: LABPROT:3,INR:3 in the last 72 hours     Assessment & Plan:   1. Respiratory failure: patient required BIPAP at [facility]  She improved with IV lasix. She has significant rhonchi and wheezes - especially on her left lung.  I have not been able to view the CRX ( on CD ROM) sent from [facility].  We will repeat the CXR.    2. Possible pneumonia:  Will treat her as a hospital acquired pneumonia since she has been in the hospital and then SNF.     3. COPD :  Will give xopenex, Atrovent, Q 6 hrs.   4. CAD: s/p recent CABG.   5. Hx of cigarette smoking: quit 2 [place] ago   6. HTN: cont. Meds., hold ACE -inhibitor until we assure that she does not have renal insufficiency   7. Osteoarthritis: continue home meds.     DVT PPX - LDSQH     Vesta Mixer, Montez Hageman., MD, [facility] 02/02/2012, 11:00 AM

## 2012-02-13 ENCOUNTER — Encounter: Payer: Self-pay | Admitting: Cardiothoracic Surgery

## 2012-02-14 ENCOUNTER — Encounter: Payer: Self-pay | Admitting: Family Medicine

## 2012-02-14 ENCOUNTER — Telehealth: Payer: Self-pay

## 2012-02-14 ENCOUNTER — Ambulatory Visit (INDEPENDENT_AMBULATORY_CARE_PROVIDER_SITE_OTHER): Payer: Medicare Other | Admitting: Family Medicine

## 2012-02-14 VITALS — BP 120/72 | HR 60 | Temp 97.9°F | Ht 60.0 in | Wt 133.0 lb

## 2012-02-14 DIAGNOSIS — J449 Chronic obstructive pulmonary disease, unspecified: Secondary | ICD-10-CM

## 2012-02-14 DIAGNOSIS — I509 Heart failure, unspecified: Secondary | ICD-10-CM

## 2012-02-14 DIAGNOSIS — J9600 Acute respiratory failure, unspecified whether with hypoxia or hypercapnia: Secondary | ICD-10-CM

## 2012-02-14 DIAGNOSIS — I2510 Atherosclerotic heart disease of native coronary artery without angina pectoris: Secondary | ICD-10-CM

## 2012-02-14 DIAGNOSIS — J189 Pneumonia, unspecified organism: Secondary | ICD-10-CM

## 2012-02-14 DIAGNOSIS — I5033 Acute on chronic diastolic (congestive) heart failure: Secondary | ICD-10-CM

## 2012-02-14 MED ORDER — TIOTROPIUM BROMIDE MONOHYDRATE 18 MCG IN CAPS: 18.0000 ug | ORAL_CAPSULE | Freq: Every day | RESPIRATORY_TRACT | Status: DC

## 2012-02-14 MED ORDER — ALPRAZOLAM 0.25 MG PO TABS: 0.2500 mg | ORAL_TABLET | Freq: Two times a day (BID) | ORAL | Status: DC | PRN

## 2012-02-14 MED ORDER — POTASSIUM CHLORIDE CRYS ER 20 MEQ PO TBCR: 20.0000 meq | EXTENDED_RELEASE_TABLET | Freq: Every day | ORAL | Status: DC

## 2012-02-14 NOTE — Telephone Encounter (Signed)
Spoke with nurse and everything taken care of but, do need scripts for medication

## 2012-02-14 NOTE — Progress Notes (Signed)
Nature conservation officer at [facility] [address] [place] [address] Phone: [phone] Fax: [phone]  Date:  02/14/2012   Name:  [name]   DOB:  November 05, 1931   MRN:  [id number] Gender: female Age: 78 y.o.  PCP:  Hannah Beat, MD  Evaluating MD: Hannah Beat, MD   Chief Complaint: Follow-up   History of Present Illness:  [name] is a 78 y.o. pleasant patient who presents with the following:  Recent d/c from hosp: 79 y/o F with hx of COPD, anxiety/depression, DM, and recent CABG 01/2012 who presented initially to [facility] then was transferred to [facility] with respiratory distress requiring bipap. She is in no acute distress today, and she looks to be well. Transitioned to PO LVQ and has tolerated this without difficulty. S/p CABG x 4 on 01/22/2012.   She has stopped using the spiriva and was concerned that this could cause pneumonia. She has been using albuterol four times daily.  Currently using her oxygen at 97% on 2L o2.  Took a pain pill this morning.   Primary Discharge Diagnoses:  1. Acute respiratory failure 2. Acute COPD exacerbation 3. Bilateral pleural effusions 4. Acute on chronic diastolic CHF 5. CAD s/p CABG 01/22/2012 (recent) - hx of NSTEMI 11/2010 6. Anemia 7. HTN  Secondary Discharge Diagnoses:  1. Rheumatoid arthritis 2. Diabetes mellitus   3. Osteoporosis 4. Hyperlipemia 5. Diverticulosis of colon   6. GIB 04/2011 felt diverticular requiring microembolization by vascular surgery.   7. Anxiety and depression   8. AVM of GI tract 9. Fatty liver 10. Chronic back pain 11. Splenic artery aneurysm 12. Excision of adrenal mass in 2007 13. RLS 14. C diff 04/2008, severe  Hospital Course: [name] is a 78 y/o F with hx of COPD, anxiety/depression, DM, and recent CABG 01/2012 who presented initially to [facility] then was transferred to [facility] with respiratory distress requiring bipap. She had had a productive thick white cough. No fever,  chills, nausea vomiting. CXR demonstrated bibasilar atelectasis and small effusions. It was felt her respiratory failure may be multifactorial from likely COPD exacerbation, diastolic CHF, and her pleural effusions. She was started on diuresis and antibiotics. She was able to be de-escalated to nasal canula. Metoprolol was changed to bisoprolol. She was not started on steroids given recent cardiac surgery. Her antibiotic coverage was narrowed to levaquin (which has been dosed q24hr given very mild decreased CrCl). She was seen by PT who recommended outpt PT. 2D Echo 11/4 showed mild LVH & mild focal basal hypertrophy of the septum, EF 55-60%, no RWMA, grade 1 diastolic dysfunction, ASA, and PA pressure . She continued to improve with the therapies implemented and was able to come off oxygen and maintain an O2 sat>90% with ambulation. She is feeling better today. Dr. Shirlee Latch has seen and examined the patient and feels she is stable for discharge. She will have rollator walker, HHPT and RN, neb machine arranged at discharge. I discussed her cardiac rehab status with Dr. Shirlee Latch. He would like her to use oxygen at night and with exertion - she already has an oxygen tank preexisting at home but has not been using it. She will complete 10 day course of Levaquin (has allergy listed to Cipro but has been consistently tolerating Levaquin in the hospital).   Patient Active Problem List  Diagnosis  . DIABETES MELLITUS, TYPE II  . HYPERLIPIDEMIA  . ANEMIA-IRON DEFICIENCY  . ANXIETY DEPRESSION  . INSOMNIA, CHRONIC  . HYPERTENSION  .  CAD (coronary artery disease)  . COPD  . DIVERTICULOSIS OF COLON  . FATTY LIVER DISEASE  . ECZEMA  . Rheumatoid arthritis  . OSTEOARTHRITIS  . LEG PAIN, BILATERAL  . OSTEOPOROSIS  . ARTERIOVENOUS MALFORMATION, GASTRIC  . AAA (abdominal aortic aneurysm)  . Smoking  . GIB (gastrointestinal bleeding)  . History of non-ST elevation myocardial infarction (NSTEMI)  . Urinary  tract infection  . Splenic artery aneurysm  . HCAP (healthcare-associated pneumonia)  . Acute respiratory failure  . Acute on chronic diastolic CHF (congestive heart failure)    Past Medical History  Diagnosis Date  . Hypertension 08/01/1995  . Rheumatoid arthritis 04/03/1983  . Osteoarthritis 04/03/1983  . Anemia, deficiency 07/01/2004    (w/u by Dr. Lorre Nick)  . Diabetes mellitus type II 07/31/1997  . Osteoporosis 12/31/2000  . CAD (coronary artery disease) 09/01/2002    a. s/p NSTEMI 2012. b. s/p CABG 01/2012.  [name] Hyperlipemia 07/02/1999  . COPD (chronic obstructive pulmonary disease) 05/31/2004  . diverticulosis of colon   . Arteriovenous malformation of gastrointestinal tract   . Fatty liver   . Chronic back pain   . Splenic artery aneurysm   . Anxiety and depression   . GI bleed     a. 04/2011 felt diverticular requiring microembolization by vascular.  . Adrenal mass     a. Excision 2007.  [name] Restless leg syndrome   . C. difficile colitis     a. 04/2008 = severe.  . Diastolic CHF     a. 02/2012: resp failure with COPD exac/CHF/pleural eff.    Past Surgical History  Procedure Date  . Nasal sinus surgery   . Inguinal hernia repair   . Partial hysterectomy     ovaries intact, dysmennorhea w/ A/P repari  . Carpal tunnel release     bilat  . Trigger finger release     right  . Wrist surgery     neuroma repair, right  . Ankle surgery     neuroma repair, right ORIF  . Total shoulder replacement 12/13/1998    right, ([facility])  . Rotator cuff repair 11/22/2003    ([facility]) left  . Epidural block injection 05/2005    x 3  . Long finger tendon realignment 10/31/2005    Meyerdierks  . Lumbar epidural 06/13/2006  . Laminectomy 06/2006    L4/5 spinal stenosis (Dr. [name])  . Laminectomy 02/24/2008    L3/4 Ant/Lat interbody fusion and Lat Artthrodesis w/plate using XLIF (Dr. [name])  . Colonic embolization 04/18/2011    Dr. Wyn Quaker, acute GI bleed  . Spinal cord stimulator  implant   . Coronary artery bypass graft 01/22/2012    Procedure: CORONARY ARTERY BYPASS GRAFTING (CABG);  Surgeon: Delight Ovens, MD;  Location: [facility] OR;  Service: Open Heart Surgery;  Laterality: N/A;  Coronary Artery Bypass Graft times four utilizing the left internal mammary artery and the left greater saphenous vein harvested endoscopically.  Rhae Hammock without cardioversion 01/22/2012    Procedure: TRANSESOPHAGEAL ECHOCARDIOGRAM (TEE);  Surgeon: Delight Ovens, MD;  Location: [facility] OR;  Service: Open Heart Surgery;  Laterality: N/A;    History  Substance Use Topics  . Smoking status: Current Some Day Smoker -- 62 years    Types: Cigarettes  . Smokeless tobacco: Never Used     Comment: Smokes a few puffs occasionally  . Alcohol Use: Yes     Comment: red wine occassionally    Family History  Problem Relation Age of Onset  . Hypotension  Mother   . Dementia Mother   . Alcohol abuse Father   . Heart disease Father     MI, enlarged heart  . Diabetes Brother     Allergies  Allergen Reactions  . Iron Diarrhea  . Morphine Sulfate Nausea And Vomiting    At high doses hallicuniations  . Naproxen Rash    REACTION: UNSPECIFIED    Medication list has been reviewed and updated.  Outpatient Prescriptions Prior to Visit  Medication Sig Dispense Refill  . albuterol (PROVENTIL) (5 MG/ML) 0.5% nebulizer solution Take 0.5 mLs (2.5 mg total) by nebulization every 4 (four) hours as needed for wheezing or shortness of breath.  20 mL  1  . ALPRAZolam (XANAX) 0.25 MG tablet Take 1 tablet (0.25 mg total) by mouth 2 (two) times daily as needed for anxiety.  30 tablet  0  . ALPRAZolam (XANAX) 0.5 MG tablet Take 0.5 mg by mouth at bedtime as needed. For sleep      . aspirin EC 325 MG EC tablet Take 1 tablet (325 mg total) by mouth daily.  30 tablet    . atorvastatin (LIPITOR) 40 MG tablet Take 1 tablet (40 mg total) by mouth daily.      . bisoprolol (ZEBETA) 5 MG tablet Take 1 tablet (5 mg total)  by mouth daily.  30 tablet  6  . fish oil-omega-3 fatty acids 1000 MG capsule Take 1 g by mouth daily.      . furosemide (LASIX) 40 MG tablet Take 1 tablet (40 mg total) by mouth as directed. Take 1 tablet by mouth twice daily for 3 days, then decrease to 1 tablet daily.  30 tablet  6  . guaiFENesin (MUCINEX) 600 MG 12 hr tablet Take 600 mg by mouth 2 (two) times daily.       . Multiple Vitamin (MULTIVITAMIN WITH MINERALS) TABS Take 1 tablet by mouth daily.      [name] oxyCODONE (OXY IR/ROXICODONE) 5 MG immediate release tablet Take 1-2 tablets (5-10 mg total) by mouth every 6 (six) hours as needed.  50 tablet  0  . oxyCODONE (OXYCONTIN) 20 MG 12 hr tablet Take 20 mg by mouth every 6 (six) hours.      . potassium chloride (K-DUR) 10 MEQ tablet Take 2 tablets (20 mEq total) by mouth daily.  7 tablet  0  . ramipril (ALTACE) 5 MG capsule Take 1 capsule (5 mg total) by mouth daily.  30 capsule  6  . saccharomyces boulardii (FLORASTOR) 250 MG capsule Take 250 mg by mouth daily.      . Saline (OCEAN NASAL SPRAY NA) Place 1-2 sprays into the nose daily as needed. For dry nasal passages      . [DISCONTINUED] tiotropium (SPIRIVA HANDIHALER) 18 MCG inhalation capsule Place 1 capsule (18 mcg total) into inhaler and inhale daily.  30 capsule  1   Last reviewed on 02/02/2012  1:40 PM by Truddie Hidden, CPHT  Review of Systems:  Some pain with movement at her sternotomy sight. And with a deep breath. No active SOB right now. No fever, no chills. No nausea.   Physical Examination: Filed Vitals:   02/14/12 1000  BP: 120/72  Pulse: 60  Temp: 97.9 F (36.6 C)  TempSrc: Oral  Height: 5' (1.524 m)  Weight: 133 lb (60.328 kg)  SpO2: 97%    Body mass index is 25.97 kg/(m^2). Ideal Body Weight: Weight in (lb) to have BMI = 25: 127.7    GEN:  WDWN, NAD, Non-toxic, A & O x 3 HEENT: Atraumatic, Normocephalic. Neck supple. No masses, No LAD. Ears and Nose: No external deformity. CV: RRR, No M/G/R. No JVD. No  thrill. No extra heart sounds. PULM: CTA B, rare wheezes. No crackles.  EXTR: No c/c/e NEURO Normal gait.  PSYCH: Normally interactive. Conversant. Not depressed or anxious appearing.  Calm demeanor.   Dg Chest 2 View  02/04/2012  *RADIOLOGY REPORT*  Clinical Data: Pneumonia, weakness, cough.  CHEST - 2 VIEW  Comparison: 02/02/2012  Findings: Previous CABG.  Dorsal stimulator catheter leads project in stable location.  Bilateral shoulder arthroplasty hardware is partially seen.  Persistent moderate right and smaller left pleural effusions.  Associated atelectasis versus consolidation in the basilar segments of the lower lobes.  Heart size upper limits normal.  No overt interstitial edema.  Patchy aortic calcifications.  Spondylitic changes in the lower thoracic spine.  IMPRESSION:  1.  Persistent moderate right and smaller left pleural effusions. 2.  Stable postoperative changes.   Original Report Authenticated By: D. Andria Rhein, MD    Assessment and Plan:  1. COPD (chronic obstructive pulmonary disease)  Ambulatory referral to Pulmonology  2. Acute respiratory failure    3. HCAP (healthcare-associated pneumonia)    4. COPD    5. Acute on chronic diastolic CHF (congestive heart failure)    6. CAD (coronary artery disease)     Wrote for some meds that the patient did not receive on d/c - as below. Resume spiriva.  Complex pulmonary case, recent respiratory failure -- the patient has been resistant in the past to carrying a diagnosis of COPD. After some time to heal post cabg, pft's very helpful, and I would really like pulmonary input.  Improved post-PNA, AF  Home Health documentation: Face to face encounter documentation follows: Patient needs home health services for the following reasons:  Home health services needed: 1. HHRN assessment 2. HHPT:  eval and treat, assist with walking and gait analysis Patient homebound for the following reasons who: on continuous o2, COPD, CAD, CHF, s/p  CABG, recent prolonged hospitalization.   Orders Today:  Orders Placed This Encounter  Procedures  . Ambulatory referral to Pulmonology    Referral Priority:  Routine    Referral Type:  Consultation    Referral Reason:  Specialty Services Required    Requested Specialty:  Pulmonary Disease    Number of Visits Requested:  1    Updated Medication List: (Includes new medications, updates to list, dose adjustments) Meds ordered this encounter  Medications  . DISCONTD: gabapentin (NEURONTIN) 100 MG capsule    Sig: Take 100 mg by mouth at bedtime.  . ALPRAZolam (XANAX) 0.25 MG tablet    Sig: Take 1 tablet (0.25 mg total) by mouth 2 (two) times daily as needed for anxiety.    Dispense:  60 tablet    Refill:  3  . potassium chloride SA (K-DUR,KLOR-CON) 20 MEQ tablet    Sig: Take 1 tablet (20 mEq total) by mouth daily.    Dispense:  30 tablet    Refill:  11  . tiotropium (SPIRIVA HANDIHALER) 18 MCG inhalation capsule    Sig: Place 1 capsule (18 mcg total) into inhaler and inhale daily.    Dispense:  30 capsule    Refill:  0    Medications Discontinued: Medications Discontinued During This Encounter  Medication Reason  . tiotropium (SPIRIVA HANDIHALER) 18 MCG inhalation capsule Error  . ALPRAZolam (XANAX) 0.25 MG tablet Reorder  Hannah Beat, MD

## 2012-02-14 NOTE — Telephone Encounter (Signed)
[name] with [facility]; pt admitted to [facility] last week, had faxed over OT and clarification of meds; has not had response.Please advise.

## 2012-02-14 NOTE — Patient Instructions (Addendum)
F/u 2 months  REFERRAL: GO THE THE FRONT ROOM AT THE ENTRANCE OF OUR CLINIC, NEAR CHECK IN. ASK FOR MARION. SHE WILL HELP YOU SET UP YOUR REFERRAL. DATE: TIME:

## 2012-02-15 ENCOUNTER — Ambulatory Visit (INDEPENDENT_AMBULATORY_CARE_PROVIDER_SITE_OTHER): Payer: Medicare Other | Admitting: Nurse Practitioner

## 2012-02-15 ENCOUNTER — Other Ambulatory Visit (INDEPENDENT_AMBULATORY_CARE_PROVIDER_SITE_OTHER): Payer: Medicare Other

## 2012-02-15 ENCOUNTER — Encounter: Payer: Self-pay | Admitting: Nurse Practitioner

## 2012-02-15 VITALS — BP 130/68 | HR 72 | Ht 60.0 in | Wt 132.8 lb

## 2012-02-15 DIAGNOSIS — I10 Essential (primary) hypertension: Secondary | ICD-10-CM

## 2012-02-15 DIAGNOSIS — E785 Hyperlipidemia, unspecified: Secondary | ICD-10-CM

## 2012-02-15 DIAGNOSIS — I2510 Atherosclerotic heart disease of native coronary artery without angina pectoris: Secondary | ICD-10-CM

## 2012-02-15 DIAGNOSIS — R0609 Other forms of dyspnea: Secondary | ICD-10-CM

## 2012-02-15 DIAGNOSIS — Z951 Presence of aortocoronary bypass graft: Secondary | ICD-10-CM

## 2012-02-15 DIAGNOSIS — R0600 Dyspnea, unspecified: Secondary | ICD-10-CM

## 2012-02-15 LAB — BASIC METABOLIC PANEL
BUN: 29 mg/dL — ABNORMAL HIGH (ref 6–23)
CO2: 31 mEq/L (ref 19–32)
Calcium: 8.9 mg/dL (ref 8.4–10.5)
Chloride: 103 mEq/L (ref 96–112)
Creatinine, Ser: 0.8 mg/dL (ref 0.4–1.2)
GFR: 72.63 mL/min (ref >60.00)
Glucose, Bld: 189 mg/dL — ABNORMAL HIGH (ref 70–99)
Potassium: 3.1 mEq/L — ABNORMAL LOW (ref 3.5–5.1)
Sodium: 140 mEq/L (ref 135–145)

## 2012-02-15 NOTE — Progress Notes (Signed)
[name] Date of Birth: 01/24/1932 Medical Record #[id number]  History of Present Illness: [name] is seen back today for a post hospital visit. She is seen for Dr. Shirlee Latch. She has multiple medical issues which include RA, DM, HLD, GIB, anxiety, AVMs, fatty liver, chronic back pain, splenic artery aneurysm, anemia, HTN, COPD with past tobacco abuse and known CAD. She has had prior NSTEMI in 2012, s/p CABG in October of 2013 per Dr. Tyrone Sage.  She has most recently been admitted with acute respiratory failure, acute COPD exacerbation, bilateral pleural effusions and acute on chronic diastolic CHF. She presented initially to [facility] and then was transferred to [facility] with respiratory distress. She required bipap. She was diuresed and given antibiotics.   She comes in today. She is here with her caregiver. Has her oxygen in place. Continues to cough but is improving. She says that overall she is getting stronger. Less dyspnea. Doing more walking in her house. Using her oxygen all the time. Still a little weak. Little popping/clicking in her sternum. Not doing any lifting. Appetite comes and goes. Bowels working ok. Remains on pain medicine but this is a chronic issue. Overall she thinks she is making progress. No smoking.   Current Outpatient Prescriptions on File Prior to Visit  Medication Sig Dispense Refill  . albuterol (PROVENTIL) (5 MG/ML) 0.5% nebulizer solution Take 0.5 mLs (2.5 mg total) by nebulization every 4 (four) hours as needed for wheezing or shortness of breath.  20 mL  1  . ALPRAZolam (XANAX) 0.25 MG tablet Take 1 tablet (0.25 mg total) by mouth 2 (two) times daily as needed for anxiety.  60 tablet  3  . ALPRAZolam (XANAX) 0.5 MG tablet Take 0.5 mg by mouth at bedtime as needed. For sleep      . aspirin EC 325 MG EC tablet Take 1 tablet (325 mg total) by mouth daily.  30 tablet    . atorvastatin (LIPITOR) 40 MG tablet Take 1 tablet (40 mg total) by mouth daily.      . bisoprolol  (ZEBETA) 5 MG tablet Take 1 tablet (5 mg total) by mouth daily.  30 tablet  6  . fish oil-omega-3 fatty acids 1000 MG capsule Take 1 g by mouth daily.      . furosemide (LASIX) 40 MG tablet Take 1 tablet (40 mg total) by mouth as directed. Take 1 tablet by mouth twice daily for 3 days, then decrease to 1 tablet daily.  30 tablet  6  . guaiFENesin (MUCINEX) 600 MG 12 hr tablet Take 600 mg by mouth 2 (two) times daily.       . Multiple Vitamin (MULTIVITAMIN WITH MINERALS) TABS Take 1 tablet by mouth daily.      [name] oxyCODONE (OXY IR/ROXICODONE) 5 MG immediate release tablet Take 1-2 tablets (5-10 mg total) by mouth every 6 (six) hours as needed.  50 tablet  0  . oxyCODONE (OXYCONTIN) 20 MG 12 hr tablet Take 20 mg by mouth every 6 (six) hours.      . potassium chloride SA (K-DUR,KLOR-CON) 20 MEQ tablet Take 1 tablet (20 mEq total) by mouth daily.  30 tablet  11  . ramipril (ALTACE) 5 MG capsule Take 1 capsule (5 mg total) by mouth daily.  30 capsule  6  . saccharomyces boulardii (FLORASTOR) 250 MG capsule Take 250 mg by mouth daily.      . Saline (OCEAN NASAL SPRAY NA) Place 1-2 sprays into the nose daily as needed.  For dry nasal passages      . tiotropium (SPIRIVA HANDIHALER) 18 MCG inhalation capsule Place 1 capsule (18 mcg total) into inhaler and inhale daily.  30 capsule  0    Allergies  Allergen Reactions  . Iron Diarrhea  . Morphine Sulfate Nausea And Vomiting    At high doses hallicuniations  . Naproxen Rash    REACTION: UNSPECIFIED    Past Medical History  Diagnosis Date  . Hypertension 08/01/1995  . Rheumatoid arthritis 04/03/1983  . Osteoarthritis 04/03/1983  . Anemia, deficiency 07/01/2004    (w/u by Dr. Lorre Nick)  . Diabetes mellitus type II 07/31/1997  . Osteoporosis 12/31/2000  . CAD (coronary artery disease) 09/01/2002    a. s/p NSTEMI 2012. b. s/p CABG 01/2012.  [name] Hyperlipemia 07/02/1999  . COPD (chronic obstructive pulmonary disease) 05/31/2004  . diverticulosis of colon    . Arteriovenous malformation of gastrointestinal tract   . Fatty liver   . Chronic back pain   . Splenic artery aneurysm   . Anxiety and depression   . GI bleed     a. 04/2011 felt diverticular requiring microembolization by vascular.  . Adrenal mass     a. Excision 2007.  [name] Restless leg syndrome   . C. difficile colitis     a. 04/2008 = severe.  . Diastolic CHF     a. 02/2012: resp failure with COPD exac/CHF/pleural eff.    Past Surgical History  Procedure Date  . Nasal sinus surgery   . Inguinal hernia repair   . Partial hysterectomy     ovaries intact, dysmennorhea w/ A/P repari  . Carpal tunnel release     bilat  . Trigger finger release     right  . Wrist surgery     neuroma repair, right  . Ankle surgery     neuroma repair, right ORIF  . Total shoulder replacement 12/13/1998    right, ([place])  . Rotator cuff repair 11/22/2003    ([place]) left  . Epidural block injection 05/2005    x 3  . Long finger tendon realignment 10/31/2005    [place]  . Lumbar epidural 06/13/2006  . Laminectomy 06/2006    L4/5 spinal stenosis (Dr. [name])  . Laminectomy 02/24/2008    L3/4 Ant/Lat interbody fusion and Lat Artthrodesis w/plate using XLIF (Dr. [name])  . Colonic embolization 04/18/2011    Dr. Wyn Quaker, acute GI bleed  . Spinal cord stimulator implant   . Coronary artery bypass graft 01/22/2012    Procedure: CORONARY ARTERY BYPASS GRAFTING (CABG);  Surgeon: Delight Ovens, MD;  Location: [facility] OR;  Service: Open Heart Surgery;  Laterality: N/A;  Coronary Artery Bypass Graft times four utilizing the left internal mammary artery and the left greater saphenous vein harvested endoscopically.  Rhae Hammock without cardioversion 01/22/2012    Procedure: TRANSESOPHAGEAL ECHOCARDIOGRAM (TEE);  Surgeon: Delight Ovens, MD;  Location: [facility] OR;  Service: Open Heart Surgery;  Laterality: N/A;    History  Smoking status  . Former Smoker -- 62 years  . Types: Cigarettes  . Start date: 01/14/2012   Smokeless tobacco  . Never Used    Comment: Smokes a few puffs occasionally    History  Alcohol Use  . Yes    Comment: red wine occassionally    Family History  Problem Relation Age of Onset  . Hypotension Mother   . Dementia Mother   . Alcohol abuse Father   . Heart disease Father     MI,  enlarged heart  . Diabetes Brother     Review of Systems: The review of systems is per the HPI.  All other systems were reviewed and are negative.  Physical Exam: BP 130/68  Pulse 72  Ht 5' (1.524 m)  Wt 132 lb 12.8 oz (60.238 kg)  BMI 25.94 kg/m2 Patient is pleasant and in no acute distress. She looks chronically ill. Skin is warm and dry. Color is normal.  HEENT is unremarkable. Normocephalic/atraumatic. PERRL. Sclera are nonicteric. Neck is supple. No masses. No JVD. Lungs are coarse with scattered ronchi. Cardiac exam shows a regular rate and rhythm. Her sternum looks good. Abdomen is soft. Extremities are without edema. Vein harvesting from the left leg looks great. Gait and ROM are intact. No gross neurologic deficits noted.  LABORATORY DATA: EKG today shows sinus rhythm. Nonspecific T wave changes.   Lab Results  Component Value Date   WBC 10.1 02/05/2012   HGB 9.8* 02/05/2012   HCT 30.0* 02/05/2012   PLT 350 02/05/2012   GLUCOSE 102* 02/07/2012   CHOL 100 02/08/2011   TRIG 85 10/29/2011   HDL 32* 10/29/2011   LDLCALC 73 10/29/2011   ALT 14 01/18/2012   AST 17 01/18/2012   NA 136 02/07/2012   K 4.1 02/07/2012   CL 98 02/07/2012   CREATININE 0.88 02/07/2012   BUN 22 02/07/2012   CO2 29 02/07/2012   TSH 1.28 02/08/2011   INR 1.52* 01/22/2012   HGBA1C 5.7* 01/18/2012   MICROALBUR 0.5 07/19/2011   Portable Chest 1 View  02/02/2012  *RADIOLOGY REPORT*  Clinical Data: Shortness of breath, cough  PORTABLE CHEST - 1 VIEW  Comparison: 01/27/2012  Findings: Bilateral shoulder arthroplasties evident.  Persistent low lung volumes with basilar atelectasis and small effusions. Minimal  improvement compared to 01/27/2012.  No pneumothorax. Coronary bypass changes noted.  Thoracic stimulator evident.  IMPRESSION: Persistent basilar atelectasis and small effusions.   Original Report Authenticated By: Judie Petit. Miles Costain, M.D.    Echo Study Conclusions from November 2013  - Left ventricle: The cavity size was normal. Wall thickness was increased in a pattern of mild LVH. There was mild focal basal hypertrophy of the septum. Systolic function was normal. The estimated ejection fraction was in the range of 55% to 60%. Wall motion was normal; there were no regional wall motion abnormalities. Doppler parameters are consistent with abnormal left ventricular relaxation (grade 1 diastolic dysfunction). - Mitral valve: Calcified annulus. - Left atrium: The atrium was mildly dilated. - Atrial septum: There was an atrial septal aneurysm. - Pulmonary arteries: Systolic pressure was mildly increased. PA peak pressure: 38mm Hg (S).   Assessment / Plan: 1. CAD - s/p CABG - progressing well.   2. Tobacco abuse - not smoking. 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  3. COPD - with recent respiratory failure - on oxygen continuously now. She is improving. Not smoking.   Overall, she looks ok. Slow but steady progress. I do not think she is ready for cardiac rehab yet. Will check follow up labs today. See Dr. Shirlee Latch in a month.   Patient is agreeable to this plan and will call if any problems develop in the interim.

## 2012-02-15 NOTE — Patient Instructions (Addendum)
I think you are doing well.  See Dr. Shirlee Latch in one month  Stay on your current medicines  We need to check labs today (CBC/BMET)  Congratulations for not smoking!!!  Call the [facility] office at [phone] if you have any questions, problems or concerns.

## 2012-02-18 DIAGNOSIS — I2510 Atherosclerotic heart disease of native coronary artery without angina pectoris: Secondary | ICD-10-CM

## 2012-02-19 ENCOUNTER — Institutional Professional Consult (permissible substitution): Payer: Medicare Other | Admitting: Pulmonary Disease

## 2012-02-19 ENCOUNTER — Other Ambulatory Visit: Payer: Self-pay | Admitting: Cardiothoracic Surgery

## 2012-02-19 DIAGNOSIS — I2510 Atherosclerotic heart disease of native coronary artery without angina pectoris: Secondary | ICD-10-CM

## 2012-02-19 DIAGNOSIS — I714 Abdominal aortic aneurysm, without rupture: Secondary | ICD-10-CM

## 2012-02-21 ENCOUNTER — Ambulatory Visit
Admission: RE | Admit: 2012-02-21 | Discharge: 2012-02-21 | Disposition: A | Discharge status: Home / Self Care | Payer: Medicare Other | Source: Ambulatory Visit | Attending: Cardiothoracic Surgery | Admitting: Cardiothoracic Surgery

## 2012-02-21 ENCOUNTER — Ambulatory Visit (INDEPENDENT_AMBULATORY_CARE_PROVIDER_SITE_OTHER): Payer: Medicare Other | Admitting: Cardiothoracic Surgery

## 2012-02-21 ENCOUNTER — Encounter: Payer: Self-pay | Admitting: Cardiothoracic Surgery

## 2012-02-21 VITALS — BP 132/64 | HR 70 | Resp 20 | Ht 60.0 in | Wt 135.0 lb

## 2012-02-21 DIAGNOSIS — I714 Abdominal aortic aneurysm, without rupture: Secondary | ICD-10-CM

## 2012-02-21 DIAGNOSIS — I2510 Atherosclerotic heart disease of native coronary artery without angina pectoris: Secondary | ICD-10-CM

## 2012-02-21 NOTE — Progress Notes (Signed)
[address]            [place] [address]          [phone]       [name] [facility] Medical Record #[id number] Date of Birth: 1931/08/13  Melanie Morale, MD Melanie Beat, MD  Chief Complaint:   PostOp Follow Up Visit 01/22/2012    OPERATIVE REPORT  PREOPERATIVE DIAGNOSIS: Coronary occlusive disease with unstable  angina.  POSTOPERATIVE DIAGNOSIS: Coronary occlusive disease with unstable  angina.  SURGICAL PROCEDURE: Coronary artery bypass grafting x4 with the left  internal mammary to the left anterior descending coronary artery,  reverse saphenous vein graft to the diagonal coronary artery, reverse  saphenous vein graft to the distal circumflex and reverse saphenous vein  graft to the distal right coronary artery with left leg endo vein  harvesting.   History of Present Illness:      Feels better, now back at daughter in law is care ing for. Breathing is improving      History  Smoking status  . Former Smoker -- 62 years  . Types: Cigarettes  . Start date: 01/14/2012  Smokeless tobacco  . Never Used    Comment: Smokes a few puffs occasionally       Allergies  Allergen Reactions  . Iron Diarrhea  . Morphine Sulfate Nausea And Vomiting    At high doses hallicuniations  . Naproxen Rash    REACTION: UNSPECIFIED    Current Outpatient Prescriptions  Medication Sig Dispense Refill  . albuterol (PROVENTIL) (5 MG/ML) 0.5% nebulizer solution Take 0.5 mLs (2.5 mg total) by nebulization every 4 (four) hours as needed for wheezing or shortness of breath.  20 mL  1  . ALPRAZolam (XANAX) 0.25 MG tablet Take 1 tablet (0.25 mg total) by mouth 2 (two) times daily as needed for anxiety.  60 tablet  3  . ALPRAZolam (XANAX) 0.5 MG tablet Take 0.5 mg by mouth at bedtime as needed. For sleep      . aspirin EC 325 MG EC tablet Take 1 tablet (325 mg total) by mouth daily.  30 tablet    . atorvastatin (LIPITOR) 40 MG tablet  Take 1 tablet (40 mg total) by mouth daily.      . bisoprolol (ZEBETA) 5 MG tablet Take 1 tablet (5 mg total) by mouth daily.  30 tablet  6  . fish oil-omega-3 fatty acids 1000 MG capsule Take 1 g by mouth daily.      . furosemide (LASIX) 40 MG tablet Take 1 tablet (40 mg total) by mouth as directed. Take 1 tablet by mouth twice daily for 3 days, then decrease to 1 tablet daily.  30 tablet  6  . guaiFENesin (MUCINEX) 600 MG 12 hr tablet Take 600 mg by mouth 2 (two) times daily.       . Multiple Vitamin (MULTIVITAMIN WITH MINERALS) TABS Take 1 tablet by mouth daily.      . NON FORMULARY 2 L daily. oxygen      . oxyCODONE (OXY IR/ROXICODONE) 5 MG immediate release tablet Take 1-2 tablets (5-10 mg total) by mouth every 6 (six) hours as needed.  50 tablet  0  . oxyCODONE (OXYCONTIN) 20 MG 12 hr tablet Take 20 mg by mouth every 6 (six) hours.      . potassium chloride SA (K-DUR,KLOR-CON) 20 MEQ tablet Take  1 tablet (20 mEq total) by mouth daily.  30 tablet  11  . ramipril (ALTACE) 5 MG capsule Take 1 capsule (5 mg total) by mouth daily.  30 capsule  6  . saccharomyces boulardii (FLORASTOR) 250 MG capsule Take 250 mg by mouth daily.      . Saline (OCEAN NASAL SPRAY NA) Place 1-2 sprays into the nose daily as needed. For dry nasal passages      . tiotropium (SPIRIVA HANDIHALER) 18 MCG inhalation capsule Place 1 capsule (18 mcg total) into inhaler and inhale daily.  30 capsule  0       Physical Exam: BP 132/64  Pulse 70  Resp 20  Ht 5' (1.524 m)  Wt 135 lb (61.236 kg)  BMI 26.37 kg/m2  SpO2 92%  General appearance: alert and cooperative Neurologic: intact Heart: regular rate and rhythm, S1, S2 normal, no murmur, click, rub or gallop and normal apical impulse Lungs: clear to auscultation bilaterally and normal percussion bilaterally Abdomen: soft, non-tender; bowel sounds normal; no masses,  no organomegaly Extremities: extremities normal, atraumatic, no cyanosis or edema and Homans sign is  negative, no sign of DVT Wound: sternum stable   Diagnostic Studies & Laboratory data:         Recent Radiology Findings: Dg Chest 2 View  02/21/2012  *RADIOLOGY REPORT*  Clinical Data: Follow up CABG, some shortness of breath  CHEST - 2 VIEW  Comparison: Chest x-ray of 02/04/2012  Findings: The right pleural effusion has resolved.  There has been some decrease in the left pleural effusion with left basilar atelectasis.  Chronic elevation of the right hemidiaphragm is again noted.  Mild cardiomegaly is stable.  The bones are osteopenic. Bilateral humeral head prostheses are present.  IMPRESSION:   Decrease in size of the small left pleural effusion with resolution of the prior small right pleural effusion.   Original Report Authenticated By: Dwyane Dee, M.D.       Recent Labs: Lab Results  Component Value Date   WBC 10.1 02/05/2012   HGB 9.8* 02/05/2012   HCT 30.0* 02/05/2012   PLT 350 02/05/2012   GLUCOSE 189* 02/15/2012   CHOL 100 02/08/2011   TRIG 85 10/29/2011   HDL 32* 10/29/2011   LDLCALC 73 10/29/2011   ALT 14 01/18/2012   AST 17 01/18/2012   NA 140 02/15/2012   K 3.1* 02/15/2012   CL 103 02/15/2012   CREATININE 0.8 02/15/2012   BUN 29* 02/15/2012   CO2 31 02/15/2012   TSH 1.28 02/08/2011   INR 1.52* 01/22/2012   HGBA1C 5.7* 01/18/2012      Assessment / Plan:    Stable  After CABG and readmission for pneumonia change to 81 mg ASA with previous history of GI bleed      [name] 02/21/2012 5:04 PM

## 2012-02-22 ENCOUNTER — Other Ambulatory Visit: Payer: Self-pay | Admitting: *Deleted

## 2012-02-22 ENCOUNTER — Telehealth: Payer: Self-pay

## 2012-02-22 MED ORDER — POTASSIUM CHLORIDE CRYS ER 20 MEQ PO TBCR: 20.0000 meq | EXTENDED_RELEASE_TABLET | Freq: Every day | ORAL | Status: DC

## 2012-02-22 NOTE — Telephone Encounter (Signed)
Can you call [name]  She is supposed to be on 20 meq of potassium  They sent her out of the hospital on 2 tablets, but it was of 10 eq tablets, so when she was in the office, I tried to simplify it so that she only take 1 a day, but changed to 20 meq tablets.  1 a day.

## 2012-02-22 NOTE — Telephone Encounter (Signed)
I cant find any documentation of this and do feel comfortable changing what we have in the computer will route to PCP

## 2012-02-22 NOTE — Telephone Encounter (Signed)
She is supposed to take 20 eq potassium daily. NOT 40  Please call and help clarify. We changed from 10 to 20 meq tabs

## 2012-02-22 NOTE — Telephone Encounter (Signed)
Chris from [facility] in [place]; re; Potassium 20 meq instructions one tab daily. Pt told Thayer Ohm that pt was increased and pt takes Potassium 20 meq twice a day.Please advise.

## 2012-02-25 ENCOUNTER — Other Ambulatory Visit: Payer: Self-pay | Admitting: *Deleted

## 2012-02-25 DIAGNOSIS — E785 Hyperlipidemia, unspecified: Secondary | ICD-10-CM

## 2012-02-25 DIAGNOSIS — I10 Essential (primary) hypertension: Secondary | ICD-10-CM

## 2012-02-25 DIAGNOSIS — I2510 Atherosclerotic heart disease of native coronary artery without angina pectoris: Secondary | ICD-10-CM

## 2012-02-25 NOTE — Telephone Encounter (Signed)
So, Lawson Fiscal gerhardt called patient on 02-17-2012 and told her to increase potassium 20 meq to bid and have blood redrawn in 2 [place] should she still do this or is it 2 much potassium

## 2012-02-25 NOTE — Telephone Encounter (Signed)
Patient advised.

## 2012-02-25 NOTE — Telephone Encounter (Signed)
This is right -- i did not see the lab note from Ms. Tyrone Sage - my mistake, she changed the dose I had her on.  It is supposed to be 20 meq bid

## 2012-02-26 ENCOUNTER — Other Ambulatory Visit: Payer: Self-pay | Admitting: *Deleted

## 2012-02-26 MED ORDER — POTASSIUM CHLORIDE CRYS ER 20 MEQ PO TBCR: 20.0000 meq | EXTENDED_RELEASE_TABLET | Freq: Two times a day (BID) | ORAL | Status: DC

## 2012-02-27 ENCOUNTER — Other Ambulatory Visit (INDEPENDENT_AMBULATORY_CARE_PROVIDER_SITE_OTHER): Payer: Medicare Other

## 2012-02-27 DIAGNOSIS — I10 Essential (primary) hypertension: Secondary | ICD-10-CM

## 2012-02-27 DIAGNOSIS — I2510 Atherosclerotic heart disease of native coronary artery without angina pectoris: Secondary | ICD-10-CM

## 2012-02-27 DIAGNOSIS — E785 Hyperlipidemia, unspecified: Secondary | ICD-10-CM

## 2012-02-27 LAB — BASIC METABOLIC PANEL
CO2: 32 mEq/L (ref 19–32)
Calcium: 9.1 mg/dL (ref 8.4–10.5)
Chloride: 103 mEq/L (ref 96–112)
Creatinine, Ser: 0.9 mg/dL (ref 0.4–1.2)
Glucose, Bld: 101 mg/dL — ABNORMAL HIGH (ref 70–99)
Sodium: 140 mEq/L (ref 135–145)

## 2012-02-27 LAB — CBC WITH DIFFERENTIAL/PLATELET
Basophils Absolute: 0 10*3/uL (ref 0.0–0.1)
Basophils Relative: 0.5 % (ref 0.0–3.0)
Eosinophils Absolute: 0.7 10*3/uL (ref 0.0–0.7)
Hemoglobin: 10.3 g/dL — ABNORMAL LOW (ref 12.0–15.0)
Lymphocytes Relative: 21.5 % (ref 12.0–46.0)
MCHC: 32 g/dL (ref 30.0–36.0)
MCV: 94.2 fl (ref 78.0–100.0)
Monocytes Absolute: 0.6 10*3/uL (ref 0.1–1.0)
Neutro Abs: 5.5 10*3/uL (ref 1.4–7.7)
RDW: 14.4 % (ref 11.5–14.6)

## 2012-03-08 DIAGNOSIS — Z951 Presence of aortocoronary bypass graft: Secondary | ICD-10-CM

## 2012-03-21 ENCOUNTER — Ambulatory Visit (INDEPENDENT_AMBULATORY_CARE_PROVIDER_SITE_OTHER): Payer: Medicare Other | Admitting: Cardiology

## 2012-03-21 ENCOUNTER — Encounter: Payer: Self-pay | Admitting: Cardiology

## 2012-03-21 VITALS — BP 103/51 | HR 52 | Ht 60.0 in | Wt 137.4 lb

## 2012-03-21 DIAGNOSIS — F17200 Nicotine dependence, unspecified, uncomplicated: Secondary | ICD-10-CM

## 2012-03-21 DIAGNOSIS — R0602 Shortness of breath: Secondary | ICD-10-CM

## 2012-03-21 DIAGNOSIS — E785 Hyperlipidemia, unspecified: Secondary | ICD-10-CM

## 2012-03-21 DIAGNOSIS — I25810 Atherosclerosis of coronary artery bypass graft(s) without angina pectoris: Secondary | ICD-10-CM

## 2012-03-21 DIAGNOSIS — J449 Chronic obstructive pulmonary disease, unspecified: Secondary | ICD-10-CM

## 2012-03-21 DIAGNOSIS — I5033 Acute on chronic diastolic (congestive) heart failure: Secondary | ICD-10-CM

## 2012-03-21 DIAGNOSIS — I5032 Chronic diastolic (congestive) heart failure: Secondary | ICD-10-CM

## 2012-03-21 DIAGNOSIS — I509 Heart failure, unspecified: Secondary | ICD-10-CM

## 2012-03-21 DIAGNOSIS — I10 Essential (primary) hypertension: Secondary | ICD-10-CM

## 2012-03-21 DIAGNOSIS — I2510 Atherosclerotic heart disease of native coronary artery without angina pectoris: Secondary | ICD-10-CM

## 2012-03-21 NOTE — Patient Instructions (Addendum)
Decrease lasix(furosemide) to 20mg  daily.  Decrease KCL(potassium) to 20 mEq daily.  Your physician recommends that you have  lab work today--BMET/BNP.  Your physician recommends that you schedule a follow-up appointment in: 6 [place] with Dr Shirlee Latch.

## 2012-03-22 LAB — BASIC METABOLIC PANEL
BUN: 30 mg/dL — ABNORMAL HIGH (ref 6–23)
Calcium: 8.2 mg/dL — ABNORMAL LOW (ref 8.4–10.5)
Chloride: 106 mEq/L (ref 96–112)
Creat: 0.97 mg/dL (ref 0.50–1.10)

## 2012-03-22 LAB — BRAIN NATRIURETIC PEPTIDE: Brain Natriuretic Peptide: 102.8 pg/mL — ABNORMAL HIGH (ref 0.0–100.0)

## 2012-03-24 NOTE — Progress Notes (Signed)
Patient ID: [name], female   DOB: 03-21-1932, 78 y.o.   MRN: [id number] PCP: Dr. Patsy Lager  78 yo with history of COPD, HTN, diabetes, and CAD presents for cardiology followup.  Patient was admitted to [facility] with chest pain in 8/12.  Troponin was mildly elevated.  Left heart cath showed severe, diffuse disease in a small RCA that was not amenable to intervention.  This was the likely culprit vessel.  She also had a 70% LAD stenosis.  [name] was then done to assess for ischemia in the LAD distribution.  This showed only a fixed apical perfusion defect with no ischemia (low risk).  She was admitted to [facility] in 1/13 with a lower GI bleed that was thought to be diverticular.  She required blood transfusion.  Due to ongoing episodes of chest pain, I sent her for LHC in 10/13, and she ended up having CABG x 4.  She did well post-op and was sent to a nursing home.  She was, however, readmitted in 11/13 with acute on chronic diastolic CHF and an an acute exacerbation of COPD.  She was diuresed and treated with antibiotics.  Echo in 11/13 showed normal LV systolic function.   She is now back home by herself (stayed with son and daughter after most recent discharge).  No exertional chest pain.  She had one episode of pressure in her arms bilaterally that occurred at rest and lasted for several minutes.  She is smoking 1 cigarette/week now.  No exertional dyspnea when she walks around the house.  She has not been particularly active.    ECG: NSR, HR 57  Labs (8/12): LDL 58, HDL 22 Labs (11/12): LDL 54, HDl 32, creatinine 0.8 Labs (4/13): K 3.5, creatinine 1.3 Labs (7/13): K 3.2, creatinine 1.0, LDL 73, HDL 32 Labs (11/13): K 3.9, creatinine 0.9 Labs (12/13): K 5.8, creatinine 0.77  PMH: 1. COPD 2. HTN 3. Diabetes mellitus type II 4. ? AAA: Patient says she was told by Dr. Hetty Delacruz that she has an aneurysm. Abdominal US (10/12) showed no AAA.  5. Chronic low back pain 6. Osteoarthritis: hip  pain 7. Anemia  8. Excision of adrenal mass in 2007 9. Restless leg syndrome. 10. Bilateral shoulder replacements.  11. CAD: Cath in 2004 with moderate nonobstructive disease.  Patient was admitted to [facility] in 8/12 with NSTEMI.  LHC (8/12) showed severe diffuse disease of a small RCA.  This was the culprit lesion but it was too small and diffusely diseased for intervention.  There was a 70% proximal LAD stenosis.  Lexiscan myoview (9/12) to assess for LAD territory ischemia showed a small fixed apical defect, low risk.  EF 65% by myoview. CABG 10/13 with LIMA-LAD, SVG-D, SVG-PLOM, and SVG-RCA.  Echo (11/13): EF 55-60%, grade I diastolic dysfunction, PA systolic pressure 38 mmHg.  12. Diverticular bleed 1/13 requiring microembolization by vascular surgery.  13. ABIs (2/13) within normal limits.  14. Chronic diastolic CHF  SH: Quit smoking in 9/12 after NSTEMI.  Now restarted and smokes 1 cig/day.  [place] in [name]    FH: Father with MI/CHF, died in his 86s.   ROS: All systems reviewed and negative except as per HPI.    Current Outpatient Prescriptions  Medication Sig Dispense Refill  . albuterol (PROVENTIL) (5 MG/ML) 0.5% nebulizer solution Take 0.5 mLs (2.5 mg total) by nebulization every 4 (four) hours as needed for wheezing or shortness of breath.  20 mL  1  . aspirin EC  81 MG tablet Take 81 mg by mouth daily.      [name] atorvastatin (LIPITOR) 40 MG tablet Take 1 tablet (40 mg total) by mouth daily.      . bisoprolol (ZEBETA) 5 MG tablet Take 1 tablet (5 mg total) by mouth daily.  30 tablet  6  . fish oil-omega-3 fatty acids 1000 MG capsule Take 1 g by mouth daily.      [name] guaiFENesin (MUCINEX) 600 MG 12 hr tablet Take 600 mg by mouth 2 (two) times daily.       . Multiple Vitamin (MULTIVITAMIN WITH MINERALS) TABS Take 1 tablet by mouth daily.      . NON FORMULARY 2 L daily. oxygen      . oxyCODONE (OXYCONTIN) 20 MG 12 hr tablet Take 20 mg by mouth every 6 (six) hours.      .  potassium chloride SA (K-DUR,KLOR-CON) 20 MEQ tablet Take 1 tablet (20 mEq total) by mouth daily.      . ramipril (ALTACE) 5 MG capsule Take 1 capsule (5 mg total) by mouth daily.  30 capsule  6  . saccharomyces boulardii (FLORASTOR) 250 MG capsule Take 250 mg by mouth daily.      . Saline (OCEAN NASAL SPRAY NA) Place 1-2 sprays into the nose daily as needed. For dry nasal passages      . tiotropium (SPIRIVA HANDIHALER) 18 MCG inhalation capsule Place 1 capsule (18 mcg total) into inhaler and inhale daily.  30 capsule  0  . ALPRAZolam (XANAX) 0.25 MG tablet Take 1 tablet (0.25 mg total) by mouth 2 (two) times daily as needed for anxiety.  60 tablet  3  . ALPRAZolam (XANAX) 0.5 MG tablet Take 0.5 mg by mouth at bedtime as needed. For sleep      . furosemide (LASIX) 20 MG tablet Take 1 tablet (20 mg total) by mouth daily.      [name] oxyCODONE (OXY IR/ROXICODONE) 5 MG immediate release tablet Take 1-2 tablets (5-10 mg total) by mouth every 6 (six) hours as needed.  50 tablet  0    BP 103/51  Pulse 52  Ht 5' (1.524 m)  Wt 137 lb 6.4 oz (62.324 kg)  BMI 26.83 kg/m2  SpO2 93% General: elderly, NAD Neck: No JVD, no thyromegaly or thyroid nodule.  Lungs: Prolonged expiratory phase, occasional rhonchi.  CV: Nondisplaced PMI.  Heart regular S1/S2, no S3/S4, no murmur.  No peripheral edema.  No carotid bruit. Unable to palpate pedal pulses.  Abdomen: Soft, nontender, no hepatosplenomegaly, no distention.  Neurologic: Alert and oriented x 3.  Psych: Normal affect. Extremities: No clubbing or cyanosis.   Assessment/Plan:  CAD  Status post CABG in 10/13 with no ischemic symptoms.  - Continue ASA 81, statin, metoprolol, ACEI.  - Start cardiac rehab at [facility].  HYPERLIPIDEMIA  Continue atorvastatin.   HYPERTENSION  BP ok.  Smoking  I again strongly encouraged to quit altogether.  She is down to a cigarette/day.  Diastolic CHF     She does not appear volume overloaded now.  K is high.  I will stop  her supplemental K and decrease Lasix to 20 mg daily.  Repeat BMET in 1 week.  COPD To see pulmonary.  Marca Ancona 03/24/2012

## 2012-04-04 ENCOUNTER — Telehealth: Payer: Self-pay | Admitting: Cardiology

## 2012-04-04 NOTE — Telephone Encounter (Signed)
Called patient back. She wanted to go over medication list.  Advised to decrease lasix to 20mg  every day and no k tabs per Dr.McLean. Patient verbalized understanding.

## 2012-04-04 NOTE — Telephone Encounter (Signed)
New problem:    Clarification on which medication she suppose to be taken.

## 2012-04-07 ENCOUNTER — Encounter: Payer: Self-pay | Admitting: Cardiology

## 2012-04-08 ENCOUNTER — Encounter: Payer: Self-pay | Admitting: Pulmonary Disease

## 2012-04-08 ENCOUNTER — Ambulatory Visit (INDEPENDENT_AMBULATORY_CARE_PROVIDER_SITE_OTHER): Payer: Medicare Other | Admitting: Pulmonary Disease

## 2012-04-08 VITALS — BP 140/76 | HR 60 | Temp 97.8°F | Ht 60.0 in | Wt 139.0 lb

## 2012-04-08 DIAGNOSIS — F17200 Nicotine dependence, unspecified, uncomplicated: Secondary | ICD-10-CM

## 2012-04-08 DIAGNOSIS — J449 Chronic obstructive pulmonary disease, unspecified: Secondary | ICD-10-CM

## 2012-04-08 MED ORDER — TIOTROPIUM BROMIDE MONOHYDRATE 18 MCG IN CAPS: 18.0000 ug | ORAL_CAPSULE | Freq: Every day | RESPIRATORY_TRACT | Status: DC

## 2012-04-08 NOTE — Assessment & Plan Note (Signed)
We discussed this at length today. She has quit cold [place] multiple times in the past and would prefer to do this again. At this point we will hold off on any medical therapy for her tobacco use.

## 2012-04-08 NOTE — Progress Notes (Signed)
Subjective:    Patient ID: [name], female    DOB: Jul 18, 1931, 79 y.o.   MRN: [id number]  HPI  [name] is a pleasant 79 year old female who has a past medical history significant for COPD and coronary artery disease who comes to our clinic today for evaluation of COPD. She has smoked up to one half pack of cigarettes daily for 60 years. She has quit as many as 3 times and is currently smoking 3-4 cigarettes a day. In October of 2013 she underwent a coronary artery bypass graft by Dr. [name] at [facility]. In November of 2013 she was admitted to the hospital for several days with acute respiratory failure requiring BiPAP. Cardiology felt that she had a COPD exacerbation and she was discharged home after several days in the hospital. She was also noted to have bilateral pleural effusions which gradually improved radiographically several [place] after her surgery. She tells me today that she has very minimal shortness of breath on exertion and she's not quite sure why she's here. To be honest, I think she is not fully revealing the severity of her symptoms. She would like to get back to exercising and line dancing on a regular basis. She is currently not using oxygen and she stopped taking her Spriva. She is to start cardio rehabilitation tomorrow. She denies cough. She states that she can walk one quarter of a mile daily while walking her dog without stopping. She believes that any lung disease she may have is do to a chemical exposure that occurred in 2009. Apparently she accidentally mixed cleaning solutions and developed acute respiratory failure requiring hospitalization immediately thereafter. She was and with inhalers and oxygen for several months after this.     Past Medical History  Diagnosis Date  . Hypertension 08/01/1995  . Rheumatoid arthritis 04/03/1983  . Osteoarthritis 04/03/1983  . Anemia, deficiency 07/01/2004    (w/u by Dr. Lorre Nick)  . Diabetes mellitus  type II 07/31/1997  . Osteoporosis 12/31/2000  . CAD (coronary artery disease) 09/01/2002    a. s/p NSTEMI 2012. b. s/p CABG 01/2012.  [name] Hyperlipemia 07/02/1999  . COPD (chronic obstructive pulmonary disease) 05/31/2004  . diverticulosis of colon   . Arteriovenous malformation of gastrointestinal tract   . Fatty liver   . Chronic back pain   . Splenic artery aneurysm   . Anxiety and depression   . GI bleed     a. 04/2011 felt diverticular requiring microembolization by vascular.  . Adrenal mass     a. Excision 2007.  [name] Restless leg syndrome   . C. difficile colitis     a. 04/2008 = severe.  . Diastolic CHF     a. 02/2012: resp failure with COPD exac/CHF/pleural eff.     Family History  Problem Relation Age of Onset  . Hypotension Mother   . Dementia Mother   . Alcohol abuse Father   . Heart disease Father     MI, enlarged heart  . Diabetes Brother      History   Social History  . Marital Status: Widowed    Spouse Name: N/A    Number of Children: 4  . Years of Education: N/A   Occupational History  . [employer]     9 hour days-6 days/week   Social History Main Topics  . Smoking status: Current Some Day Smoker -- 0.1 packs/day for 62 years    Types: Cigarettes  . Smokeless tobacco: Never Used  Comment: 04/08/12- smokes 1 cig per day on occ  . Alcohol Use: Yes     Comment: red wine occassionally  . Drug Use: No  . Sexually Active: No   Other Topics Concern  . Not on file   Social History Narrative   Married, widow 1972     Allergies  Allergen Reactions  . Iron Diarrhea  . Morphine Sulfate Nausea And Vomiting    At high doses hallicuniations  . Naproxen Rash    REACTION: UNSPECIFIED     Outpatient Prescriptions Prior to Visit  Medication Sig Dispense Refill  . albuterol (PROVENTIL) (5 MG/ML) 0.5% nebulizer solution Take 0.5 mLs (2.5 mg total) by nebulization every 4 (four) hours as needed for wheezing or shortness of breath.  20 mL  1  . ALPRAZolam  (XANAX) 0.25 MG tablet Take 1 tablet (0.25 mg total) by mouth 2 (two) times daily as needed for anxiety.  60 tablet  3  . aspirin EC 81 MG tablet Take 81 mg by mouth daily.      [name] atorvastatin (LIPITOR) 40 MG tablet Take 1 tablet (40 mg total) by mouth daily.      . bisoprolol (ZEBETA) 5 MG tablet Take 1 tablet (5 mg total) by mouth daily.  30 tablet  6  . fish oil-omega-3 fatty acids 1000 MG capsule Take 1 g by mouth daily.      . furosemide (LASIX) 20 MG tablet Take 1 tablet (20 mg total) by mouth daily.      [name] guaiFENesin (MUCINEX) 600 MG 12 hr tablet Take 600 mg by mouth 2 (two) times daily.       . Multiple Vitamin (MULTIVITAMIN WITH MINERALS) TABS Take 1 tablet by mouth daily.      . ramipril (ALTACE) 5 MG capsule Take 1 capsule (5 mg total) by mouth daily.  30 capsule  6  . saccharomyces boulardii (FLORASTOR) 250 MG capsule Take 250 mg by mouth daily.      . Saline (OCEAN NASAL SPRAY NA) Place 1-2 sprays into the nose daily as needed. For dry nasal passages      . NON FORMULARY 2 L daily. oxygen      . tiotropium (SPIRIVA HANDIHALER) 18 MCG inhalation capsule Place 1 capsule (18 mcg total) into inhaler and inhale daily.  30 capsule  0  . [DISCONTINUED] ALPRAZolam (XANAX) 0.5 MG tablet Take 0.5 mg by mouth at bedtime as needed. For sleep      . [DISCONTINUED] oxyCODONE (OXY IR/ROXICODONE) 5 MG immediate release tablet Take 1-2 tablets (5-10 mg total) by mouth every 6 (six) hours as needed.  50 tablet  0  . [DISCONTINUED] oxyCODONE (OXYCONTIN) 20 MG 12 hr tablet Take 20 mg by mouth every 6 (six) hours.      . [DISCONTINUED] potassium chloride SA (K-DUR,KLOR-CON) 20 MEQ tablet Take 1 tablet (20 mEq total) by mouth daily.       Last reviewed on 04/08/2012 11:32 AM by Christen Butter, CMA    Review of Systems  Constitutional: Negative for fever, chills and unexpected weight change.  HENT: Positive for rhinorrhea and postnasal drip. Negative for ear pain, nosebleeds, congestion, sore throat,  sneezing, trouble swallowing, dental problem, voice change and sinus pressure.   Eyes: Negative for visual disturbance.  Respiratory: Positive for cough. Negative for choking and shortness of breath.   Cardiovascular: Negative for chest pain and leg swelling.  Gastrointestinal: Negative for vomiting, abdominal pain and diarrhea.  Genitourinary: Negative for difficulty  urinating.  Musculoskeletal: Negative for arthralgias.  Skin: Negative for rash.  Neurological: Negative for tremors, syncope and headaches.  Hematological: Does not bruise/bleed easily.       Objective:   Physical Exam  Filed Vitals:   04/08/12 1135  BP: 140/76  Pulse: 60  Temp: 97.8 F (36.6 C)  TempSrc: Oral  Height: 5' (1.524 m)  Weight: 139 lb (63.05 kg)  SpO2: 97%   Gen: chronically ill appearing, no acute distress HEENT: NCAT, PERRL, EOMi, OP clear, neck supple without masses PULM: CTA B CV: RRR, no mgr, no JVD AB: BS+, soft, nontender, no hsm Ext: warm, trace edema, no clubbing, no cyanosis Derm: no rash or skin breakdown Neuro: A&Ox4, CN II-XII intact, strength 5/5 in all 4 extremities  October 2013 full pulmonary function test>> FEV1 to FEC ratio 74%, FEV1 1.25 L (76% predicted), improved to 1.39 L (11% change) after bronchodilator. Total lung capacity is 4.18 L (93% predicted, residual volume 113% predicted. DLCO 15.73 (83% predicted). Flow volume loop is consistent with scooping (obstruction)  November 2013 portable chest x-ray>> poor inspiratory effort, resolved right-sided pleural effusion, minimal left-sided pleural effusion, no clear parenchymal lung disease.     Assessment & Plan:   COPD Ms. Cervini appears to have COPD based on her pulmonary function testing from October 2013. Her FEV1 to FEC ratio was not below 70%, however the shape of her flow volume loop was consistent with obstruction. This is do to her ongoing tobacco use and certainly not helped by the chlorine gas exposure that  occurred in 2009.  Aside from the exacerbation requiring BiPAP that occurred after her heart surgery in 2013, it sounds like she does not have frequent exacerbations of COPD. 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  Plan:  -I advised her that her cardiopulmonary rehabilitation will do much better if she starts using her Spiriva every day. -She desperately needs to quit smoking. -After walking in the office today there is no indication for oxygen therapy at this point. -Followup with [place] in 3 months.  Smoking We discussed this at length today. She has quit cold [place] multiple times in the past and would prefer to do this again. At this point we will hold off on any medical therapy for her tobacco use.   Updated Medication List Outpatient Encounter Prescriptions as of 04/08/2012  Medication Sig Dispense Refill  . albuterol (PROVENTIL) (5 MG/ML) 0.5% nebulizer solution Take 0.5 mLs (2.5 mg total) by nebulization every 4 (four) hours as needed for wheezing or shortness of breath.  20 mL  1  . ALPRAZolam (XANAX) 0.25 MG tablet Take 1 tablet (0.25 mg total) by mouth 2 (two) times daily as needed for anxiety.  60 tablet  3  . aspirin EC 81 MG tablet Take 81 mg by mouth daily.      [name] atorvastatin (LIPITOR) 40 MG tablet Take 1 tablet (40 mg total) by mouth daily.      . bisoprolol (ZEBETA) 5 MG tablet Take 1 tablet (5 mg total) by mouth daily.  30 tablet  6  . fish oil-omega-3 fatty acids 1000 MG capsule Take 1 g by mouth daily.      . furosemide (LASIX) 20 MG tablet Take 1 tablet (20 mg total) by mouth daily.      [name] guaiFENesin (MUCINEX) 600 MG 12 hr tablet Take 600 mg by  mouth 2 (two) times daily.       . Multiple Vitamin (MULTIVITAMIN WITH MINERALS) TABS Take 1 tablet by mouth daily.      [name] oxyCODONE-acetaminophen (PERCOCET) 10-325 MG per tablet Take 1 tablet by mouth every 6 (six) hours as needed.        . ramipril (ALTACE) 5 MG capsule Take 1 capsule (5 mg total) by mouth daily.  30 capsule  6  . saccharomyces boulardii (FLORASTOR) 250 MG capsule Take 250 mg by mouth daily.      . Saline (OCEAN NASAL SPRAY NA) Place 1-2 sprays into the nose daily as needed. For dry nasal passages      . NON FORMULARY 2 L daily. oxygen      . tiotropium (SPIRIVA HANDIHALER) 18 MCG inhalation capsule Place 1 capsule (18 mcg total) into inhaler and inhale daily.  30 capsule  0  . [DISCONTINUED] ALPRAZolam (XANAX) 0.5 MG tablet Take 0.5 mg by mouth at bedtime as needed. For sleep      . [DISCONTINUED] oxyCODONE (OXY IR/ROXICODONE) 5 MG immediate release tablet Take 1-2 tablets (5-10 mg total) by mouth every 6 (six) hours as needed.  50 tablet  0  . [DISCONTINUED] oxyCODONE (OXYCONTIN) 20 MG 12 hr tablet Take 20 mg by mouth every 6 (six) hours.      . [DISCONTINUED] potassium chloride SA (K-DUR,KLOR-CON) 20 MEQ tablet Take 1 tablet (20 mEq total) by mouth daily.      . [DISCONTINUED] tiotropium (SPIRIVA HANDIHALER) 18 MCG inhalation capsule Place 1 capsule (18 mcg total) into inhaler and inhale daily.  30 capsule  0

## 2012-04-08 NOTE — Patient Instructions (Signed)
Use the spiriva every day no matter how you feel Go to cardiac rehab as you have planned We will see you back in 3 months or sooner if needed

## 2012-04-08 NOTE — Assessment & Plan Note (Signed)
[name] appears to have COPD based on her pulmonary function testing from October 2013. Her FEV1 to FEC ratio was not below 70%, however the shape of her flow volume loop was consistent with obstruction. This is do to her ongoing tobacco use and certainly not helped by the chlorine gas exposure that occurred in 2009.  Aside from the exacerbation requiring BiPAP that occurred after her heart surgery in 2013, it sounds like she does not have frequent exacerbations of COPD. 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  Plan:  -I advised her that her cardiopulmonary rehabilitation will do much better if she starts using her Spiriva every day. -She desperately needs to quit smoking. -After walking in the office today there is no indication for oxygen therapy at this point. -Followup with [place] in 3 months.

## 2012-04-16 ENCOUNTER — Ambulatory Visit (INDEPENDENT_AMBULATORY_CARE_PROVIDER_SITE_OTHER): Payer: Medicare Other | Admitting: Family Medicine

## 2012-04-16 ENCOUNTER — Encounter: Payer: Self-pay | Admitting: Family Medicine

## 2012-04-16 VITALS — BP 120/72 | HR 60 | Temp 97.4°F | Ht 60.0 in | Wt 133.8 lb

## 2012-04-16 DIAGNOSIS — F341 Dysthymic disorder: Secondary | ICD-10-CM

## 2012-04-16 DIAGNOSIS — Z951 Presence of aortocoronary bypass graft: Secondary | ICD-10-CM

## 2012-04-16 DIAGNOSIS — I2510 Atherosclerotic heart disease of native coronary artery without angina pectoris: Secondary | ICD-10-CM

## 2012-04-16 DIAGNOSIS — J449 Chronic obstructive pulmonary disease, unspecified: Secondary | ICD-10-CM

## 2012-04-16 DIAGNOSIS — M1990 Unspecified osteoarthritis, unspecified site: Secondary | ICD-10-CM

## 2012-04-16 DIAGNOSIS — J4489 Other specified chronic obstructive pulmonary disease: Secondary | ICD-10-CM

## 2012-04-16 MED ORDER — ALPRAZOLAM 0.5 MG PO TABS: 0.5000 mg | ORAL_TABLET | Freq: Two times a day (BID) | ORAL | Status: DC | PRN

## 2012-04-16 MED ORDER — CITALOPRAM HYDROBROMIDE 10 MG PO TABS: 10.0000 mg | ORAL_TABLET | Freq: Every day | ORAL | Status: DC

## 2012-04-16 NOTE — Progress Notes (Signed)
Nature conservation officer at [facility] [address] [place] [address] Phone: [phone] Fax: [phone]  Date:  04/16/2012   Name:  [name]   DOB:  [birth year]   MRN:  [id number] Gender: female Age: 79 y.o.  PCP:  Hannah Beat, MD  Evaluating MD: Hannah Beat, MD   Chief Complaint: Follow-up   History of Present Illness:  [name] is a 79 y.o. pleasant patient who presents with the following:  Elderly patient with multiple medical problems detailed below including CABG in 01/2012 and acute respiratory failure and pneumonia. Feeling much better, and now primary complaints are back pain and hip pain. Also new complaints of significant depression.  Saw Dr. [name], did hip films, severe hip OA. Finds it difficult to sleep - could lay on her right side and could get relaxed. Now taking pain meds. [facility] Understands she is not an operative candidate at this time.  COPD: with recent consult and est with Dr. Kendrick Fries.  Using spiriva. Off o2 now except at night. 92% o2 RA in our office.  Reports compliance with all cardiac meds. No chest pain.  Anxious and depressed. Ever since came home. After came home, now she is alone, and alone all the time. Now all her neighbors. When younger, was on some medicine for depression. Husband was an alcoholic. No SI or HI. Poor sleep.  "Sick to death of the pain." Cannot cross legs in recliner.   02/14/2012 OV: Recent d/c from hosp: 79 y/o F with hx of COPD, anxiety/depression, DM, and recent CABG 01/2012 who presented initially to [facility] then was transferred to [facility] with respiratory distress requiring bipap. She is in no acute distress today, and she looks to be well. Transitioned to PO LVQ and has tolerated this without difficulty. S/p CABG x 4 on 01/22/2012.   She has stopped using the spiriva and was concerned that this could cause pneumonia. She has been using albuterol four times daily.   Currently using her oxygen at 97% on 2L o2.  Took a pain pill this morning.   Primary Discharge Diagnoses:  1. Acute respiratory failure 2. Acute COPD exacerbation 3. Bilateral pleural effusions 4. Acute on chronic diastolic CHF 5. CAD s/p CABG 01/22/2012 (recent) - hx of NSTEMI 11/2010 6. Anemia 7. HTN  Secondary Discharge Diagnoses:  1. Rheumatoid arthritis 2. Diabetes mellitus   3. Osteoporosis 4. Hyperlipemia 5. Diverticulosis of colon   6. GIB 04/2011 felt diverticular requiring microembolization by vascular surgery.   7. Anxiety and depression   8. AVM of GI tract 9. Fatty liver 10. Chronic back pain 11. Splenic artery aneurysm 12. Excision of adrenal mass in 2007 13. RLS 14. C diff 04/2008, severe   Patient Active Problem List  Diagnosis  . DIABETES MELLITUS, TYPE II  . HYPERLIPIDEMIA  . ANEMIA-IRON DEFICIENCY  . ANXIETY DEPRESSION  . INSOMNIA, CHRONIC  . HYPERTENSION  . CAD (coronary artery disease)  . COPD  . DIVERTICULOSIS OF COLON  . FATTY LIVER DISEASE  . ECZEMA  . Rheumatoid arthritis  . OSTEOARTHRITIS  . LEG PAIN, BILATERAL  . OSTEOPOROSIS  . ARTERIOVENOUS MALFORMATION, GASTRIC  . AAA (abdominal aortic aneurysm)  . Smoking  . GIB (gastrointestinal bleeding)  . History of non-ST elevation myocardial infarction (NSTEMI)  . Splenic artery aneurysm  . HCAP (healthcare-associated pneumonia)  . Acute respiratory failure  . Acute on chronic diastolic CHF (congestive heart failure)    Past  Medical History  Diagnosis Date  . Hypertension 08/01/1995  . Rheumatoid arthritis 04/03/1983  . Osteoarthritis 04/03/1983  . Anemia, deficiency 07/01/2004    (w/u by Dr. Lorre Nick)  . Diabetes mellitus type II 07/31/1997  . Osteoporosis 12/31/2000  . CAD (coronary artery disease) 09/01/2002    a. s/p NSTEMI 2012. b. s/p CABG 01/2012.  [name] Hyperlipemia 07/02/1999  . COPD (chronic obstructive pulmonary disease) 05/31/2004  . diverticulosis of colon   .  Arteriovenous malformation of gastrointestinal tract   . Fatty liver   . Chronic back pain   . Splenic artery aneurysm   . Anxiety and depression   . GI bleed     a. 04/2011 felt diverticular requiring microembolization by vascular.  . Adrenal mass     a. Excision 2007.  [name] Restless leg syndrome   . C. difficile colitis     a. 04/2008 = severe.  . Diastolic CHF     a. 02/2012: resp failure with COPD exac/CHF/pleural eff.    Past Surgical History  Procedure Date  . Nasal sinus surgery   . Inguinal hernia repair   . Partial hysterectomy     ovaries intact, dysmennorhea w/ A/P repari  . Carpal tunnel release     bilat  . Trigger finger release     right  . Wrist surgery     neuroma repair, right  . Ankle surgery     neuroma repair, right ORIF  . Total shoulder replacement 12/13/1998    right, ([place])  . Rotator cuff repair 11/22/2003    ([place]) left  . Epidural block injection 05/2005    x 3  . Long finger tendon realignment 10/31/2005    [place]  . Lumbar epidural 06/13/2006  . Laminectomy 06/2006    L4/5 spinal stenosis (Dr. [name])  . Laminectomy 02/24/2008    L3/4 Ant/Lat interbody fusion and Lat Artthrodesis w/plate using XLIF (Dr. [name])  . Colonic embolization 04/18/2011    Dr. Wyn Quaker, acute GI bleed  . Spinal cord stimulator implant   . Coronary artery bypass graft 01/22/2012    Procedure: CORONARY ARTERY BYPASS GRAFTING (CABG);  Surgeon: Delight Ovens, MD;  Location: [facility] OR;  Service: Open Heart Surgery;  Laterality: N/A;  Coronary Artery Bypass Graft times four utilizing the left internal mammary artery and the left greater saphenous vein harvested endoscopically.  Rhae Hammock without cardioversion 01/22/2012    Procedure: TRANSESOPHAGEAL ECHOCARDIOGRAM (TEE);  Surgeon: Delight Ovens, MD;  Location: [facility] OR;  Service: Open Heart Surgery;  Laterality: N/A;    History  Substance Use Topics  . Smoking status: Current Some Day Smoker -- 0.1 packs/day for 62 years     Types: Cigarettes  . Smokeless tobacco: Never Used     Comment: 04/08/12- smokes 1 cig per day on occ  . Alcohol Use: Yes     Comment: red wine occassionally    Family History  Problem Relation Age of Onset  . Hypotension Mother   . Dementia Mother   . Alcohol abuse Father   . Heart disease Father     MI, enlarged heart  . Diabetes Brother     Allergies  Allergen Reactions  . Iron Diarrhea  . Morphine Sulfate Nausea And Vomiting    At high doses hallicuniations  . Naproxen Rash    REACTION: UNSPECIFIED    Medication list has been reviewed and updated.  Outpatient Prescriptions Prior to Visit  Medication Sig Dispense Refill  . albuterol (PROVENTIL) (5 MG/ML)  0.5% nebulizer solution Take 0.5 mLs (2.5 mg total) by nebulization every 4 (four) hours as needed for wheezing or shortness of breath.  20 mL  1  . ALPRAZolam (XANAX) 0.25 MG tablet Take 1 tablet (0.25 mg total) by mouth 2 (two) times daily as needed for anxiety.  60 tablet  3  . aspirin EC 81 MG tablet Take 81 mg by mouth daily.      [name] atorvastatin (LIPITOR) 40 MG tablet Take 1 tablet (40 mg total) by mouth daily.      . bisoprolol (ZEBETA) 5 MG tablet Take 1 tablet (5 mg total) by mouth daily.  30 tablet  6  . fish oil-omega-3 fatty acids 1000 MG capsule Take 1 g by mouth daily.      . furosemide (LASIX) 20 MG tablet Take 1 tablet (20 mg total) by mouth daily.      [name] guaiFENesin (MUCINEX) 600 MG 12 hr tablet Take 600 mg by mouth 2 (two) times daily.       . Multiple Vitamin (MULTIVITAMIN WITH MINERALS) TABS Take 1 tablet by mouth daily.      . NON FORMULARY 2 L daily. oxygen      . oxyCODONE-acetaminophen (PERCOCET) 10-325 MG per tablet Take 1 tablet by mouth every 6 (six) hours as needed.      . ramipril (ALTACE) 5 MG capsule Take 1 capsule (5 mg total) by mouth daily.  30 capsule  6  . saccharomyces boulardii (FLORASTOR) 250 MG capsule Take 250 mg by mouth daily.      . Saline (OCEAN NASAL SPRAY NA) Place 1-2 sprays  into the nose daily as needed. For dry nasal passages      . tiotropium (SPIRIVA HANDIHALER) 18 MCG inhalation capsule Place 1 capsule (18 mcg total) into inhaler and inhale daily.  30 capsule  0   Last reviewed on 04/16/2012 11:48 AM by Consuello Masse, CMA  Review of Systems:   GEN: No acute illnesses, no fevers, chills. GI: No n/v/d, eating normally Pulm: some sob with exertion. Interactive and getting along well at home.  Otherwise, ROS is as per the HPI.   Physical Examination: BP 120/72  Pulse 60  Temp 97.4 F (36.3 C) (Oral)  Ht 5' (1.524 m)  Wt 133 lb 12 oz (60.669 kg)  BMI 26.12 kg/m2  SpO2 92%  Ideal Body Weight: Weight in (lb) to have BMI = 25: 127.7    GEN: WDWN, NAD, Non-toxic, A & O x 3 HEENT: Atraumatic, Normocephalic. Neck supple. No masses, No LAD. Ears and Nose: No external deformity. CV: RRR, No M/G/R. No JVD. No thrill. No extra heart sounds. PULM: CTA B, no wheezes, crackles, rhonchi. No retractions. No resp. distress. No accessory muscle use. EXTR: No c/c/e NEURO Normal gait.  PSYCH: Normally interactive. Conversant. More labile than on previous encounters.  Assessment and Plan:  1. ANXIETY DEPRESSION : worsening, feels like started after bypass surgery. Worse when alone. Will start low-dose celexa, increase xanax dosing, which she is using nightly. F/u 1 mo  2. OSTEOARTHRITIS : severe DJD of hip, chronic back pain post multiple spine surgeries, not surgical candidate now at least. Using fairly high doses of narcotics.  3. COPD : reports improved compliance, using spiriva - has f/u with pulm  4. CAD (coronary artery disease) : compliant with all cardiac meds  5. Hx of CABG     Orders Today:  No orders of the defined types were placed in this encounter.  Updated Medication List: (Includes new medications, updates to list, dose adjustments) Meds ordered this encounter  Medications  . ALPRAZolam (XANAX) 0.5 MG tablet    Sig: Take 1 tablet  (0.5 mg total) by mouth 2 (two) times daily as needed for anxiety.    Dispense:  60 tablet    Refill:  3  . citalopram (CELEXA) 10 MG tablet    Sig: Take 1 tablet (10 mg total) by mouth at bedtime.    Dispense:  30 tablet    Refill:  3    Medications Discontinued: Medications Discontinued During This Encounter  Medication Reason  . ALPRAZolam (XANAX) 0.25 MG tablet Reorder     Hannah Beat, MD

## 2012-04-16 NOTE — Patient Instructions (Addendum)
F/u 2 months 

## 2012-04-18 ENCOUNTER — Encounter: Payer: Self-pay | Admitting: Family Medicine

## 2012-04-18 DIAGNOSIS — Z951 Presence of aortocoronary bypass graft: Secondary | ICD-10-CM

## 2012-04-18 HISTORY — DX: Presence of aortocoronary bypass graft: Z95.1

## 2012-05-01 ENCOUNTER — Encounter: Payer: Self-pay | Admitting: Cardiology

## 2012-05-01 ENCOUNTER — Ambulatory Visit (INDEPENDENT_AMBULATORY_CARE_PROVIDER_SITE_OTHER): Payer: Medicare Other | Admitting: Cardiology

## 2012-05-01 VITALS — BP 114/58 | HR 60 | Ht 60.0 in | Wt 140.0 lb

## 2012-05-01 DIAGNOSIS — E785 Hyperlipidemia, unspecified: Secondary | ICD-10-CM

## 2012-05-01 DIAGNOSIS — F17200 Nicotine dependence, unspecified, uncomplicated: Secondary | ICD-10-CM

## 2012-05-01 DIAGNOSIS — I2510 Atherosclerotic heart disease of native coronary artery without angina pectoris: Secondary | ICD-10-CM

## 2012-05-01 DIAGNOSIS — I10 Essential (primary) hypertension: Secondary | ICD-10-CM

## 2012-05-01 DIAGNOSIS — J449 Chronic obstructive pulmonary disease, unspecified: Secondary | ICD-10-CM

## 2012-05-01 LAB — BASIC METABOLIC PANEL
BUN: 29 mg/dL — ABNORMAL HIGH (ref 6–23)
CO2: 29 mEq/L (ref 19–32)
Calcium: 9.5 mg/dL (ref 8.4–10.5)
Chloride: 103 mEq/L (ref 96–112)
Creatinine, Ser: 0.9 mg/dL (ref 0.4–1.2)
Glucose, Bld: 121 mg/dL — ABNORMAL HIGH (ref 70–99)

## 2012-05-01 NOTE — Progress Notes (Signed)
Patient ID: [name], female   DOB: [birth year], 79 y.o.   MRN: [id number] PCP: Dr. Patsy Lager  79 yo with history of COPD, HTN, diabetes, and CAD presents for cardiology followup.  Patient was admitted to [facility] with chest pain in 8/12.  Troponin was mildly elevated.  Left heart cath showed severe, diffuse disease in a small RCA that was not amenable to intervention.  This was the likely culprit vessel.  She also had a 70% LAD stenosis.  [name] was then done to assess for ischemia in the LAD distribution.  This showed only a fixed apical perfusion defect with no ischemia (low risk).  She was admitted to [facility] in 1/13 with a lower GI bleed that was thought to be diverticular.  She required blood transfusion.  Due to ongoing episodes of chest pain, I sent her for LHC in 10/13, and she ended up having CABG x 4.  She did well post-op and was sent to a nursing home.  She was, however, readmitted in 11/13 with acute on chronic diastolic CHF and an an acute exacerbation of COPD.  She was diuresed and treated with antibiotics.  Echo in 11/13 showed normal LV systolic function.   She is now home and doing well from a cardiac standpoint.  No exertional chest pain.  She is doing cardiac rehab at [facility].  She denies dyspnea with exertion.  She feels like she is doing much better since CABG.  She is limited, however, by back and hip pain.  This can be quite severe and she is not as active as she would like because of it.  She is no longer using oxygen and her oxygen saturation is 98% in the office today.  She is still smoking, about 1 cigarette/day.  She quit using Spiriva. She is only taking 10 mg daily Lasix.   Labs (8/12): LDL 58, HDL 22 Labs (11/12): LDL 54, HDl 32, creatinine 0.8 Labs (4/13): K 3.5, creatinine 1.3 Labs (7/13): K 3.2, creatinine 1.0, LDL 73, HDL 32 Labs (11/13): K 3.9, creatinine 0.9 Labs (12/13): K 5.8, creatinine 0.77, BNP 103  PMH: 1. COPD 2. HTN 3. Diabetes mellitus  type II 4. ? AAA: Patient says she was told by [name] that she has an aneurysm. Abdominal US (10/12) showed no AAA.  5. Chronic low back pain 6. Osteoarthritis: hip pain 7. Anemia  8. Excision of adrenal mass in 2007 9. Restless leg syndrome. 10. Bilateral shoulder replacements.  11. CAD: Cath in 2004 with moderate nonobstructive disease.  Patient was admitted to [facility] in 8/12 with NSTEMI.  LHC (8/12) showed severe diffuse disease of a small RCA.  This was the culprit lesion but it was too small and diffusely diseased for intervention.  There was a 70% proximal LAD stenosis.  Lexiscan myoview (9/12) to assess for LAD territory ischemia showed a small fixed apical defect, low risk.  EF 65% by myoview. CABG 10/13 with LIMA-LAD, SVG-D, SVG-PLOM, and SVG-RCA.  Echo (11/13): EF 55-60%, grade I diastolic dysfunction, PA systolic pressure 38 mmHg.  12. Diverticular bleed 1/13 requiring microembolization by vascular surgery.  13. ABIs (2/13) within normal limits.  14. Chronic diastolic CHF  SH: Quit smoking in 9/12 after NSTEMI.  Now restarted and smokes 1 cig/day.  Lives alone in [place].    FH: Father with MI/CHF, died in his 31s.    Current Outpatient Prescriptions  Medication Sig Dispense Refill  . albuterol (PROVENTIL) (5 MG/ML) 0.5% nebulizer solution Take  0.5 mLs (2.5 mg total) by nebulization every 4 (four) hours as needed for wheezing or shortness of breath.  20 mL  1  . ALPRAZolam (XANAX) 0.5 MG tablet Take 1 tablet (0.5 mg total) by mouth 2 (two) times daily as needed for anxiety.  60 tablet  3  . aspirin EC 81 MG tablet Take 81 mg by mouth daily.      . bisoprolol (ZEBETA) 5 MG tablet Take 1 tablet (5 mg total) by mouth daily.  30 tablet  6  . citalopram (CELEXA) 10 MG tablet Take 1 tablet (10 mg total) by mouth at bedtime.  30 tablet  3  . fish oil-omega-3 fatty acids 1000 MG capsule Take 1 g by mouth daily.      [name] guaiFENesin (MUCINEX) 600 MG 12 hr tablet Take 600 mg by mouth  2 (two) times daily.       . Multiple Vitamin (MULTIVITAMIN WITH MINERALS) TABS Take 1 tablet by mouth daily.      . NON FORMULARY 2 L daily. oxygen      . oxyCODONE-acetaminophen (PERCOCET) 10-325 MG per tablet Take 1 tablet by mouth every 6 (six) hours as needed.      . Psyllium (NATURAL FIBER PO) Take 1 capsule by mouth daily.      . ramipril (ALTACE) 5 MG capsule Take 1 capsule (5 mg total) by mouth daily.  30 capsule  6  . saccharomyces boulardii (FLORASTOR) 250 MG capsule Take 250 mg by mouth daily.      . Saline (OCEAN NASAL SPRAY NA) Place 1-2 sprays into the nose daily as needed. For dry nasal passages      . tiotropium (SPIRIVA HANDIHALER) 18 MCG inhalation capsule Place 1 capsule (18 mcg total) into inhaler and inhale daily.  30 capsule  0  . atorvastatin (LIPITOR) 40 MG tablet Take 1 tablet (40 mg total) by mouth daily.        BP 114/58  Pulse 60  Ht 5' (1.524 m)  Wt 140 lb (63.504 kg)  BMI 27.34 kg/m2  SpO2 98% General: elderly, NAD Neck: No JVD, no thyromegaly or thyroid nodule.  Lungs: Prolonged expiratory phase, otherwise clear.  CV: Nondisplaced PMI.  Heart regular S1/S2, no S3/S4, no murmur.  No peripheral edema.  No carotid bruit. Unable to palpate pedal pulses.  Abdomen: Soft, nontender, no hepatosplenomegaly, no distention.  Neurologic: Alert and oriented x 3.  Psych: Normal affect. Extremities: No clubbing or cyanosis.   Assessment/Plan:  CAD  Status post CABG in 10/13 with no ischemic symptoms.  - Continue ASA 81, statin, metoprolol, ACEI.  - Continue cardiac rehab. HYPERLIPIDEMIA  Continue atorvastatin.   HYPERTENSION  Blood pressure reasonably controlled. Smoking  I again strongly encouraged to quit altogether.  She is under a lot of stress and is not sure she will be able to stop right now.  Diastolic CHF     She does not appear volume overloaded.  She is on very low dose Lasix.  I am going to let her stop Lasix.  She does need a BMET today as K was  high at last appointment.  COPD She is no longer using oxygen and saturation is ok in the office today.  She does not want to use Spiriva. I recommended that she continue it.  [name] 05/01/2012

## 2012-05-01 NOTE — Patient Instructions (Addendum)
Your physician recommends that you schedule a follow-up appointment in:  3 MONTHS WITH DR [facility]  STOP FUROSEMIDE  Your physician recommends that you HAVE LAB WORK TODAY

## 2012-05-03 ENCOUNTER — Encounter: Payer: Self-pay | Admitting: Cardiology

## 2012-05-31 ENCOUNTER — Encounter: Payer: Self-pay | Admitting: Cardiology

## 2012-06-16 ENCOUNTER — Encounter: Payer: Self-pay | Admitting: Family Medicine

## 2012-06-16 ENCOUNTER — Ambulatory Visit (INDEPENDENT_AMBULATORY_CARE_PROVIDER_SITE_OTHER): Payer: Medicare Other | Admitting: Family Medicine

## 2012-06-16 VITALS — BP 120/74 | HR 51 | Temp 97.3°F | Ht 60.0 in | Wt 139.2 lb

## 2012-06-16 NOTE — Patient Instructions (Addendum)
F/u 6 months

## 2012-06-16 NOTE — Progress Notes (Signed)
Nature conservation officer at [facility] [address] [place] [address] Phone: [phone] Fax: [phone]  Date:  06/16/2012   Name:  [name]   DOB:  September 21, 1931   MRN:  [id number] Gender: female Age: 79 y.o.  Primary Physician:  Hannah Beat, MD  Evaluating MD: Hannah Beat, MD   Chief Complaint: Follow-up   History of Present Illness:  [name] is a 79 y.o. pleasant patient who presents with the following:  F/u dep / anxiety after CABG:  Several years ago, was looking for her birth certificate, had a headache. Now has been getting some exercise classes and will have some dull pain in her head.   Does not want a stroke.  Past Thursday, had pain down her left hand and took a NTG.  Some tenderness at CABG incision.   Depression is doing much better now. Anxiety pill not every day. This past weekend got down, b/c wants to be more active.    Patient Active Problem List  Diagnosis  . DIABETES MELLITUS, TYPE II  . HYPERLIPIDEMIA  . ANEMIA-IRON DEFICIENCY  . ANXIETY DEPRESSION  . INSOMNIA, CHRONIC  . HYPERTENSION  . CAD (coronary artery disease)  . COPD  . DIVERTICULOSIS OF COLON  . FATTY LIVER DISEASE  . ECZEMA  . Rheumatoid arthritis  . OSTEOARTHRITIS  . OSTEOPOROSIS  . ARTERIOVENOUS MALFORMATION, GASTRIC  . AAA (abdominal aortic aneurysm)  . Smoking  . GIB (gastrointestinal bleeding)  . History of non-ST elevation myocardial infarction (NSTEMI)  . Splenic artery aneurysm  . HCAP (healthcare-associated pneumonia)  . Acute respiratory failure  . Acute on chronic diastolic CHF (congestive heart failure)  . Hx of CABG    Past Medical History  Diagnosis Date  . Hypertension 08/01/1995  . Rheumatoid arthritis 04/03/1983  . Osteoarthritis 04/03/1983  . Anemia, deficiency 07/01/2004    (w/u by Dr. Lorre Nick)  . Diabetes mellitus type II 07/31/1997  . Osteoporosis 12/31/2000  . CAD (coronary artery disease) 09/01/2002    a. s/p NSTEMI  2012. b. s/p CABG 01/2012.  [name] Hyperlipemia 07/02/1999  . COPD (chronic obstructive pulmonary disease) 05/31/2004  . diverticulosis of colon   . Arteriovenous malformation of gastrointestinal tract   . Fatty liver   . Chronic back pain   . Splenic artery aneurysm   . Anxiety and depression   . GI bleed     a. 04/2011 felt diverticular requiring microembolization by vascular.  . Adrenal mass     a. Excision 2007.  [name] Restless leg syndrome   . C. difficile colitis     a. 04/2008 = severe.  . Diastolic CHF     a. 02/2012: resp failure with COPD exac/CHF/pleural eff.  [name] Hx of CABG 04/18/2012    Past Surgical History  Procedure Laterality Date  . Nasal sinus surgery    . Inguinal hernia repair    . Partial hysterectomy      ovaries intact, dysmennorhea w/ A/P repari  . Carpal tunnel release      bilat  . Trigger finger release      right  . Wrist surgery      neuroma repair, right  . Ankle surgery      neuroma repair, right ORIF  . Total shoulder replacement  12/13/1998    right, ([facility])  . Rotator cuff repair  11/22/2003    ([facility]) left  . Epidural block injection  05/2005    x 3  . Long finger tendon  realignment  10/31/2005    Meyerdierks  . Lumbar epidural  06/13/2006  . Laminectomy  06/2006    L4/5 spinal stenosis (Dr. [name])  . Laminectomy  02/24/2008    L3/4 Ant/Lat interbody fusion and Lat Artthrodesis w/plate using XLIF (Dr. [name])  . Colonic embolization  04/18/2011    Dr. Wyn Quaker, acute GI bleed  . Spinal cord stimulator implant    . Coronary artery bypass graft  01/22/2012    Procedure: CORONARY ARTERY BYPASS GRAFTING (CABG);  Surgeon: Delight Ovens, MD;  Location: [facility] OR;  Service: Open Heart Surgery;  Laterality: N/A;  Coronary Artery Bypass Graft times four utilizing the left internal mammary artery and the left greater saphenous vein harvested endoscopically.  Rhae Hammock without cardioversion  01/22/2012    Procedure: TRANSESOPHAGEAL ECHOCARDIOGRAM (TEE);  Surgeon:  Delight Ovens, MD;  Location: [facility] OR;  Service: Open Heart Surgery;  Laterality: N/A;    History   Social History  . Marital Status: Widowed    Spouse Name: N/A    Number of Children: 4  . Years of Education: N/A   Occupational History  . [employer]     9 hour days-6 days/week   Social History Main Topics  . Smoking status: Current Some Day Smoker -- 0.10 packs/day for 62 years    Types: Cigarettes  . Smokeless tobacco: Never Used     Comment: 04/08/12- smokes 1 cig per day on occ  . Alcohol Use: Yes     Comment: red wine occassionally  . Drug Use: No  . Sexually Active: No   Other Topics Concern  . Not on file   Social History Narrative   Married, widow 64          Family History  Problem Relation Age of Onset  . Hypotension Mother   . Dementia Mother   . Alcohol abuse Father   . Heart disease Father     MI, enlarged heart  . Diabetes Brother     Allergies  Allergen Reactions  . Iron Diarrhea  . Morphine Sulfate Nausea And Vomiting    At high doses hallicuniations  . Naproxen Rash    REACTION: UNSPECIFIED    Medication list has been reviewed and updated.  Outpatient Prescriptions Prior to Visit  Medication Sig Dispense Refill  . albuterol (PROVENTIL) (5 MG/ML) 0.5% nebulizer solution Take 0.5 mLs (2.5 mg total) by nebulization every 4 (four) hours as needed for wheezing or shortness of breath.  20 mL  1  . ALPRAZolam (XANAX) 0.5 MG tablet Take 1 tablet (0.5 mg total) by mouth 2 (two) times daily as needed for anxiety.  60 tablet  3  . aspirin EC 81 MG tablet Take 81 mg by mouth daily.      . bisoprolol (ZEBETA) 5 MG tablet Take 1 tablet (5 mg total) by mouth daily.  30 tablet  6  . citalopram (CELEXA) 10 MG tablet Take 1 tablet (10 mg total) by mouth at bedtime.  30 tablet  3  . fish oil-omega-3 fatty acids 1000 MG capsule Take 1 g by mouth daily.      [name] guaiFENesin (MUCINEX) 600 MG 12 hr tablet Take 600 mg by mouth 2 (two) times daily.       . Multiple  Vitamin (MULTIVITAMIN WITH MINERALS) TABS Take 1 tablet by mouth daily.      . NON FORMULARY 2 L daily. oxygen      . oxyCODONE-acetaminophen (PERCOCET) 10-325 MG per tablet Take  1 tablet by mouth every 6 (six) hours as needed.      . Psyllium (NATURAL FIBER PO) Take 1 capsule by mouth daily.      . ramipril (ALTACE) 5 MG capsule Take 1 capsule (5 mg total) by mouth daily.  30 capsule  6  . saccharomyces boulardii (FLORASTOR) 250 MG capsule Take 250 mg by mouth daily.      . Saline (OCEAN NASAL SPRAY NA) Place 1-2 sprays into the nose daily as needed. For dry nasal passages      . tiotropium (SPIRIVA HANDIHALER) 18 MCG inhalation capsule Place 1 capsule (18 mcg total) into inhaler and inhale daily.  30 capsule  0  . atorvastatin (LIPITOR) 40 MG tablet Take 1 tablet (40 mg total) by mouth daily.       No facility-administered medications prior to visit.    Review of Systems:  O/w feeling pretty well. Some pain at incision site with many types of exercise. Eating ok.  Physical Examination: BP 120/74  Pulse 51  Temp(Src) 97.3 F (36.3 C) (Oral)  Ht 5' (1.524 m)  Wt 139 lb 4 oz (63.163 kg)  BMI 27.2 kg/m2  SpO2 96%  Ideal Body Weight: Weight in (lb) to have BMI = 25: 127.7   GEN: WDWN, NAD, Non-toxic, A & O x 3 HEENT: Atraumatic, Normocephalic. Neck supple. No masses, No LAD. Ears and Nose: No external deformity. CV: RRR, No M/G/R. No JVD. No thrill. No extra heart sounds. PULM: CTA B, no wheezes, crackles, rhonchi. No retractions. No resp. distress. No accessory muscle use. EXTR: No c/c/e NEURO Normal gait.  PSYCH: Normally interactive. Conversant. Not depressed or anxious appearing.  Calm demeanor.   Assessment and Plan: Depression   Doing better Reassured about lower sternal incision, but it is mildly tender  Signed, Carleton Vanvalkenburgh T. Lexy Meininger, MD 06/16/2012 11:17 AM

## 2012-06-19 ENCOUNTER — Encounter: Payer: Self-pay | Admitting: Family Medicine

## 2012-06-19 ENCOUNTER — Ambulatory Visit (INDEPENDENT_AMBULATORY_CARE_PROVIDER_SITE_OTHER): Payer: Medicare Other | Admitting: Family Medicine

## 2012-06-19 VITALS — BP 120/74 | HR 54 | Temp 98.3°F | Ht 60.0 in | Wt 138.5 lb

## 2012-06-19 MED ORDER — DOXYCYCLINE HYCLATE 100 MG PO TABS: 100.0000 mg | ORAL_TABLET | Freq: Two times a day (BID) | ORAL | Status: DC

## 2012-06-19 NOTE — Progress Notes (Signed)
Nature conservation officer at [facility] [address] [place] [address] Phone: [phone] Fax: [phone]  Date:  06/19/2012   Name:  [name]   DOB:  06-28-1931   MRN:  [id number] Gender: female Age: 79 y.o.  Primary Physician:  Hannah Beat, MD  Evaluating MD: Hannah Beat, MD   Chief Complaint: Fever and Cough   History of Present Illness:  [name] is a 79 y.o. pleasant patient who presents with the following:  Had a fever at therapy. Feels pretty terrible. If lays down and stop, has a hard time to get started again. Nose is running like cracy, and throat is a little sore.   CABG in the fall, now SOB, productive cough since her office visit with [place] earlier in the week. + Fever, but not sure how high. No n/v/d. She is not using her spiriva.   Patient Active Problem List  Diagnosis  . DIABETES MELLITUS, TYPE II  . HYPERLIPIDEMIA  . ANEMIA-IRON DEFICIENCY  . ANXIETY DEPRESSION  . INSOMNIA, CHRONIC  . HYPERTENSION  . CAD (coronary artery disease)  . COPD  . DIVERTICULOSIS OF COLON  . FATTY LIVER DISEASE  . ECZEMA  . Rheumatoid arthritis  . OSTEOARTHRITIS  . OSTEOPOROSIS  . ARTERIOVENOUS MALFORMATION, GASTRIC  . AAA (abdominal aortic aneurysm)  . Smoking  . GIB (gastrointestinal bleeding)  . History of non-ST elevation myocardial infarction (NSTEMI)  . Splenic artery aneurysm  . HCAP (healthcare-associated pneumonia)  . Acute respiratory failure  . Acute on chronic diastolic CHF (congestive heart failure)  . Hx of CABG  . Depression    Past Medical History  Diagnosis Date  . Hypertension 08/01/1995  . Rheumatoid arthritis 04/03/1983  . Osteoarthritis 04/03/1983  . Anemia, deficiency 07/01/2004    (w/u by Dr. Lorre Nick)  . Diabetes mellitus type II 07/31/1997  . Osteoporosis 12/31/2000  . CAD (coronary artery disease) 09/01/2002    a. s/p NSTEMI 2012. b. s/p CABG 01/2012.  [name] Hyperlipemia 07/02/1999  . COPD (chronic obstructive  pulmonary disease) 05/31/2004  . diverticulosis of colon   . Arteriovenous malformation of gastrointestinal tract   . Fatty liver   . Chronic back pain   . Splenic artery aneurysm   . Anxiety and depression   . GI bleed     a. 04/2011 felt diverticular requiring microembolization by vascular.  . Adrenal mass     a. Excision 2007.  [name] Restless leg syndrome   . C. difficile colitis     a. 04/2008 = severe.  . Diastolic CHF     a. 02/2012: resp failure with COPD exac/CHF/pleural eff.  [name] Hx of CABG 04/18/2012    Past Surgical History  Procedure Laterality Date  . Nasal sinus surgery    . Inguinal hernia repair    . Partial hysterectomy      ovaries intact, dysmennorhea w/ A/P repari  . Carpal tunnel release      bilat  . Trigger finger release      right  . Wrist surgery      neuroma repair, right  . Ankle surgery      neuroma repair, right ORIF  . Total shoulder replacement  12/13/1998    right, ([facility])  . Rotator cuff repair  11/22/2003    ([facility]) left  . Epidural block injection  05/2005    x 3  . Long finger tendon realignment  10/31/2005    Meyerdierks  . Lumbar epidural  06/13/2006  .  Laminectomy  06/2006    L4/5 spinal stenosis (Dr. [name])  . Laminectomy  02/24/2008    L3/4 Ant/Lat interbody fusion and Lat Artthrodesis w/plate using XLIF (Dr. [name])  . Colonic embolization  04/18/2011    Dr. Wyn Quaker, acute GI bleed  . Spinal cord stimulator implant    . Coronary artery bypass graft  01/22/2012    Procedure: CORONARY ARTERY BYPASS GRAFTING (CABG);  Surgeon: Delight Ovens, MD;  Location: [facility] OR;  Service: Open Heart Surgery;  Laterality: N/A;  Coronary Artery Bypass Graft times four utilizing the left internal mammary artery and the left greater saphenous vein harvested endoscopically.  Rhae Hammock without cardioversion  01/22/2012    Procedure: TRANSESOPHAGEAL ECHOCARDIOGRAM (TEE);  Surgeon: Delight Ovens, MD;  Location: [facility] OR;  Service: Open Heart Surgery;  Laterality:  N/A;    History   Social History  . Marital Status: Widowed    Spouse Name: N/A    Number of Children: 4  . Years of Education: N/A   Occupational History  . [employer]     9 hour days-6 days/week   Social History Main Topics  . Smoking status: Current Some Day Smoker -- 0.10 packs/day for 62 years    Types: Cigarettes  . Smokeless tobacco: Never Used     Comment: 04/08/12- smokes 1 cig per day on occ  . Alcohol Use: Yes     Comment: red wine occassionally  . Drug Use: No  . Sexually Active: No   Other Topics Concern  . Not on file   Social History Narrative   Married, widow 65          Family History  Problem Relation Age of Onset  . Hypotension Mother   . Dementia Mother   . Alcohol abuse Father   . Heart disease Father     MI, enlarged heart  . Diabetes Brother     Allergies  Allergen Reactions  . Iron Diarrhea  . Morphine Sulfate Nausea And Vomiting    At high doses hallicuniations  . Naproxen Rash    REACTION: UNSPECIFIED    Medication list has been reviewed and updated.  Outpatient Prescriptions Prior to Visit  Medication Sig Dispense Refill  . albuterol (PROVENTIL) (5 MG/ML) 0.5% nebulizer solution Take 0.5 mLs (2.5 mg total) by nebulization every 4 (four) hours as needed for wheezing or shortness of breath.  20 mL  1  . ALPRAZolam (XANAX) 0.5 MG tablet Take 1 tablet (0.5 mg total) by mouth 2 (two) times daily as needed for anxiety.  60 tablet  3  . aspirin EC 81 MG tablet Take 81 mg by mouth daily.      . bisoprolol (ZEBETA) 5 MG tablet Take 1 tablet (5 mg total) by mouth daily.  30 tablet  6  . citalopram (CELEXA) 10 MG tablet Take 1 tablet (10 mg total) by mouth at bedtime.  30 tablet  3  . fish oil-omega-3 fatty acids 1000 MG capsule Take 1 g by mouth daily.      [name] guaiFENesin (MUCINEX) 600 MG 12 hr tablet Take 600 mg by mouth 2 (two) times daily.       . Multiple Vitamin (MULTIVITAMIN WITH MINERALS) TABS Take 1 tablet by mouth daily.      . NON  FORMULARY 2 L daily. oxygen      . oxyCODONE-acetaminophen (PERCOCET) 10-325 MG per tablet Take 1 tablet by mouth every 6 (six) hours as needed.      [name]  Psyllium (NATURAL FIBER PO) Take 1 capsule by mouth daily.      . ramipril (ALTACE) 5 MG capsule Take 1 capsule (5 mg total) by mouth daily.  30 capsule  6  . saccharomyces boulardii (FLORASTOR) 250 MG capsule Take 250 mg by mouth daily.      . Saline (OCEAN NASAL SPRAY NA) Place 1-2 sprays into the nose daily as needed. For dry nasal passages      . tiotropium (SPIRIVA HANDIHALER) 18 MCG inhalation capsule Place 1 capsule (18 mcg total) into inhaler and inhale daily.  30 capsule  0   No facility-administered medications prior to visit.    Review of Systems:  ROS: GEN: Acute illness details above GI: Tolerating PO intake GU: maintaining adequate hydration and urination Pulm: above Interactive and getting along well at home.  Otherwise, ROS is as per the HPI.   Physical Examination: BP 120/74  Pulse 54  Temp(Src) 98.3 F (36.8 C) (Oral)  Ht 5' (1.524 m)  Wt 138 lb 8 oz (62.823 kg)  BMI 27.05 kg/m2  SpO2 97%  Ideal Body Weight: Weight in (lb) to have BMI = 25: 127.7   GEN: A and O x 3. WDWN. NAD.    ENT: Nose clear, ext NML.  No LAD.  No JVD.  TM's clear. Oropharynx clear.  PULM: Normal WOB, no distress. Diffuse rhonchi, more on the R CV: RRR, no M/G/R, No rubs, No JVD.   EXT: warm and well-perfused, No c/c/e. PSYCH: Pleasant and conversant.   Assessment and Plan:  Acute bronchitis  COPD exacerbation  Use alb, doxy Pulse ox 97 on RA  Orders Today:  No orders of the defined types were placed in this encounter.    Updated Medication List: (Includes new medications, updates to list, dose adjustments) Meds ordered this encounter  Medications  . doxycycline (VIBRA-TABS) 100 MG tablet    Sig: Take 1 tablet (100 mg total) by mouth 2 (two) times daily.    Dispense:  20 tablet    Refill:  0    Medications  Discontinued: There are no discontinued medications.    Signed, Elpidio Galea. Shavonda Wiedman, MD 06/19/2012 10:22 AM

## 2012-07-01 ENCOUNTER — Encounter: Payer: Self-pay | Admitting: Cardiology

## 2012-07-01 ENCOUNTER — Other Ambulatory Visit: Payer: Self-pay | Admitting: Family Medicine

## 2012-07-01 NOTE — Telephone Encounter (Signed)
Call her?  This is ununusal - doxy refill? What is going on - is she sick or what?

## 2012-07-01 NOTE — Telephone Encounter (Signed)
Please have her f/u with me tomorrow. I want to check and make sure her oxygen level is ok and that she is ok to be at home. And that we don't need to put her in hospital.

## 2012-07-01 NOTE — Telephone Encounter (Signed)
Patient is still sick still congested and feeling feverish and just cant kick the sickness

## 2012-07-01 NOTE — Telephone Encounter (Signed)
Patient advised and is coming in tomorrow

## 2012-07-02 ENCOUNTER — Ambulatory Visit (INDEPENDENT_AMBULATORY_CARE_PROVIDER_SITE_OTHER): Payer: Medicare HMO | Admitting: Family Medicine

## 2012-07-02 ENCOUNTER — Telehealth: Payer: Self-pay

## 2012-07-02 ENCOUNTER — Encounter: Payer: Self-pay | Admitting: Family Medicine

## 2012-07-02 VITALS — BP 120/60 | HR 59 | Temp 97.9°F | Ht 60.0 in | Wt 136.2 lb

## 2012-07-02 DIAGNOSIS — J189 Pneumonia, unspecified organism: Secondary | ICD-10-CM

## 2012-07-02 DIAGNOSIS — J441 Chronic obstructive pulmonary disease with (acute) exacerbation: Secondary | ICD-10-CM

## 2012-07-02 DIAGNOSIS — R300 Dysuria: Secondary | ICD-10-CM

## 2012-07-02 LAB — POCT URINALYSIS DIPSTICK
Ketones, UA: NEGATIVE
Spec Grav, UA: 1.025
Urobilinogen, UA: NEGATIVE
pH, UA: 6

## 2012-07-02 MED ORDER — LEVOFLOXACIN 500 MG PO TABS: 500.0000 mg | ORAL_TABLET | Freq: Every day | ORAL | Status: DC

## 2012-07-02 MED ORDER — NITROGLYCERIN 0.3 MG SL SUBL: 0.3000 mg | SUBLINGUAL_TABLET | SUBLINGUAL | Status: DC | PRN

## 2012-07-02 MED ORDER — PREDNISONE 20 MG PO TABS: ORAL_TABLET | ORAL | Status: DC

## 2012-07-02 NOTE — Telephone Encounter (Signed)
Ok   Hannah Beat, MD 07/02/2012, 1:52 PM

## 2012-07-02 NOTE — Telephone Encounter (Signed)
Pharmacy advised  

## 2012-07-02 NOTE — Telephone Encounter (Signed)
Chris from [facility] left v/m requesting change from nitroglycerin 0.3 mg sublingual to 0.4 mg sublingual([facility] has 0.4 mg in stock). Pt usually gets the 0.4 mg.Please advise.

## 2012-07-02 NOTE — Progress Notes (Signed)
Nature conservation officer at [facility] [address] [place] [address] Phone: [phone] Fax: [phone]  Date:  07/02/2012   Name:  [name]   DOB:  [birth year]   MRN:  [id number] Gender: female Age: 79 y.o.  Primary Physician:  Hannah Beat, MD  Evaluating MD: Hannah Beat, MD   Chief Complaint: not feeling beter   History of Present Illness:  [name] is a 79 y.o. pleasant patient who presents with the following:  10 days history, worsening cough, and a fever off and on, productive cough. Feeling SOB - comes and goes. Doxy - for a week. (10 days) Sig hx of COPD, compliant with spiriva, has home 02, h/o CABG in the fall, h/o smoking, multiple probs detailed below.  Now worsening clinically, 94% pulse ox, more short of breath, cough and prod of very thick sputum. Fever "off and on."   Patient Active Problem List  Diagnosis  . DIABETES MELLITUS, TYPE II  . HYPERLIPIDEMIA  . ANEMIA-IRON DEFICIENCY  . ANXIETY DEPRESSION  . INSOMNIA, CHRONIC  . HYPERTENSION  . CAD (coronary artery disease)  . COPD  . DIVERTICULOSIS OF COLON  . FATTY LIVER DISEASE  . ECZEMA  . Rheumatoid arthritis  . OSTEOARTHRITIS  . OSTEOPOROSIS  . ARTERIOVENOUS MALFORMATION, GASTRIC  . AAA (abdominal aortic aneurysm)  . Smoking  . GIB (gastrointestinal bleeding)  . History of non-ST elevation myocardial infarction (NSTEMI)  . Splenic artery aneurysm  . HCAP (healthcare-associated pneumonia)  . Acute respiratory failure  . Acute on chronic diastolic CHF (congestive heart failure)  . Hx of CABG  . Depression    Past Medical History  Diagnosis Date  . Hypertension 08/01/1995  . Rheumatoid arthritis 04/03/1983  . Osteoarthritis 04/03/1983  . Anemia, deficiency 07/01/2004    (w/u by Dr. Lorre Nick)  . Diabetes mellitus type II 07/31/1997  . Osteoporosis 12/31/2000  . CAD (coronary artery disease) 09/01/2002    a. s/p NSTEMI 2012. b. s/p CABG 01/2012.  [name] Hyperlipemia  07/02/1999  . COPD (chronic obstructive pulmonary disease) 05/31/2004  . diverticulosis of colon   . Arteriovenous malformation of gastrointestinal tract   . Fatty liver   . Chronic back pain   . Splenic artery aneurysm   . Anxiety and depression   . GI bleed     a. 04/2011 felt diverticular requiring microembolization by vascular.  . Adrenal mass     a. Excision 2007.  [name] Restless leg syndrome   . C. difficile colitis     a. 04/2008 = severe.  . Diastolic CHF     a. 02/2012: resp failure with COPD exac/CHF/pleural eff.  [name] Hx of CABG 04/18/2012    Past Surgical History  Procedure Laterality Date  . Nasal sinus surgery    . Inguinal hernia repair    . Partial hysterectomy      ovaries intact, dysmennorhea w/ A/P repari  . Carpal tunnel release      bilat  . Trigger finger release      right  . Wrist surgery      neuroma repair, right  . Ankle surgery      neuroma repair, right ORIF  . Total shoulder replacement  12/13/1998    right, ([facility])  . Rotator cuff repair  11/22/2003    ([facility]) left  . Epidural block injection  05/2005    x 3  . Long finger tendon realignment  10/31/2005    Meyerdierks  . Lumbar epidural  06/13/2006  . Laminectomy  06/2006    L4/5 spinal stenosis (Dr. [name])  . Laminectomy  02/24/2008    L3/4 Ant/Lat interbody fusion and Lat Artthrodesis w/plate using XLIF (Dr. [name])  . Colonic embolization  04/18/2011    Dr. Wyn Quaker, acute GI bleed  . Spinal cord stimulator implant    . Coronary artery bypass graft  01/22/2012    Procedure: CORONARY ARTERY BYPASS GRAFTING (CABG);  Surgeon: Delight Ovens, MD;  Location: [facility] OR;  Service: Open Heart Surgery;  Laterality: N/A;  Coronary Artery Bypass Graft times four utilizing the left internal mammary artery and the left greater saphenous vein harvested endoscopically.  Rhae Hammock without cardioversion  01/22/2012    Procedure: TRANSESOPHAGEAL ECHOCARDIOGRAM (TEE);  Surgeon: Delight Ovens, MD;  Location: [facility] OR;   Service: Open Heart Surgery;  Laterality: N/A;    History   Social History  . Marital Status: Widowed    Spouse Name: N/A    Number of Children: 4  . Years of Education: N/A   Occupational History  . [employer]     9 hour days-6 days/week   Social History Main Topics  . Smoking status: Current Some Day Smoker -- 0.10 packs/day for 62 years    Types: Cigarettes  . Smokeless tobacco: Never Used     Comment: 04/08/12- smokes 1 cig per day on occ  . Alcohol Use: Yes     Comment: red wine occassionally  . Drug Use: No  . Sexually Active: No   Other Topics Concern  . Not on file   Social History Narrative   Married, widow 68          Family History  Problem Relation Age of Onset  . Hypotension Mother   . Dementia Mother   . Alcohol abuse Father   . Heart disease Father     MI, enlarged heart  . Diabetes Brother     Allergies  Allergen Reactions  . Iron Diarrhea  . Morphine Sulfate Nausea And Vomiting    At high doses hallicuniations  . Naproxen Rash    REACTION: UNSPECIFIED    Medication list has been reviewed and updated.  Outpatient Prescriptions Prior to Visit  Medication Sig Dispense Refill  . albuterol (PROVENTIL) (5 MG/ML) 0.5% nebulizer solution Take 0.5 mLs (2.5 mg total) by nebulization every 4 (four) hours as needed for wheezing or shortness of breath.  20 mL  1  . ALPRAZolam (XANAX) 0.5 MG tablet Take 1 tablet (0.5 mg total) by mouth 2 (two) times daily as needed for anxiety.  60 tablet  3  . aspirin EC 81 MG tablet Take 81 mg by mouth daily.      . bisoprolol (ZEBETA) 5 MG tablet Take 1 tablet (5 mg total) by mouth daily.  30 tablet  6  . citalopram (CELEXA) 10 MG tablet Take 1 tablet (10 mg total) by mouth at bedtime.  30 tablet  3  . doxycycline (VIBRA-TABS) 100 MG tablet Take 1 tablet (100 mg total) by mouth 2 (two) times daily.  20 tablet  0  . fish oil-omega-3 fatty acids 1000 MG capsule Take 1 g by mouth daily.      [name] guaiFENesin (MUCINEX) 600 MG  12 hr tablet Take 600 mg by mouth 2 (two) times daily.       . Multiple Vitamin (MULTIVITAMIN WITH MINERALS) TABS Take 1 tablet by mouth daily.      . NON FORMULARY 2 L daily. oxygen      .  oxyCODONE-acetaminophen (PERCOCET) 10-325 MG per tablet Take 1 tablet by mouth every 6 (six) hours as needed.      . Psyllium (NATURAL FIBER PO) Take 1 capsule by mouth daily.      . ramipril (ALTACE) 5 MG capsule Take 1 capsule (5 mg total) by mouth daily.  30 capsule  6  . saccharomyces boulardii (FLORASTOR) 250 MG capsule Take 250 mg by mouth daily.      . Saline (OCEAN NASAL SPRAY NA) Place 1-2 sprays into the nose daily as needed. For dry nasal passages      . tiotropium (SPIRIVA HANDIHALER) 18 MCG inhalation capsule Place 1 capsule (18 mcg total) into inhaler and inhale daily.  30 capsule  0   No facility-administered medications prior to visit.    Review of Systems:  ROS: GEN: Acute illness details above GI: Tolerating PO intake GU: maintaining adequate hydration and urination Pulm: + SOB Interactive and getting along well at home.  Otherwise, ROS is as per the HPI.   Physical Examination: BP 120/60  Pulse 59  Temp(Src) 97.9 F (36.6 C) (Oral)  Ht 5' (1.524 m)  Wt 136 lb 4 oz (61.803 kg)  BMI 26.61 kg/m2  SpO2 94%  Ideal Body Weight: Weight in (lb) to have BMI = 25: 127.7   GEN: A and O x 3. WDWN. NAD.    ENT: Nose clear, ext NML.  No LAD.  No JVD.  TM's clear. Oropharynx clear.  PULM: Normal WOB, no distress. LLL crackles, scattered wheezing CV: RRR, no M/G/R, No rubs, No JVD.   EXT: warm and well-perfused, No c/c/e. PSYCH: Pleasant and conversant.   Assessment and Plan:  CAP (community acquired pneumonia)  Dysuria - Plan: POCT Urinalysis Dipstick, Urine culture  COPD exacerbation  PNA, COPD exac. Steroids, albuterol, LVQ UTI? cx and LVQ  Results for orders placed in visit on 07/02/12  POCT URINALYSIS DIPSTICK      Result Value Range   Color, UA brown      Clarity, UA cloudy     Glucose, UA negative     Bilirubin, UA negative     Ketones, UA negative     Spec Grav, UA 1.025     Blood, UA 3+     pH, UA 6.0     Protein, UA 3+     Urobilinogen, UA negative     Nitrite, UA negative     Leukocytes, UA         Orders Today:  Orders Placed This Encounter  Procedures  . Urine culture  . POCT Urinalysis Dipstick    Updated Medication List: (Includes new medications, updates to list, dose adjustments) Meds ordered this encounter  Medications  . levofloxacin (LEVAQUIN) 500 MG tablet    Sig: Take 1 tablet (500 mg total) by mouth daily.    Dispense:  10 tablet    Refill:  0  . predniSONE (DELTASONE) 20 MG tablet    Sig: 2 tabs po daily for 5 days, then 1 tab po daily    Dispense:  15 tablet    Refill:  0  . nitroGLYCERIN (NITROSTAT) 0.3 MG SL tablet    Sig: Place 1 tablet (0.3 mg total) under the tongue every 5 (five) minutes as needed for chest pain. Max 3 per day    Dispense:  50 tablet    Refill:  3    Medications Discontinued: Medications Discontinued During This Encounter  Medication Reason  . doxycycline (VIBRA-TABS) 100 MG  tablet       Signed, Karleen Hampshire T. Arohi Salvatierra, MD 07/02/2012 12:18 PM

## 2012-07-05 ENCOUNTER — Other Ambulatory Visit: Payer: Self-pay | Admitting: Family Medicine

## 2012-07-11 ENCOUNTER — Telehealth: Payer: Self-pay | Admitting: Pulmonary Disease

## 2012-07-11 NOTE — Telephone Encounter (Signed)
Called and spoke to pt on 07/11/12 to make next ov per recall.  Pt stated she didn't want to make an appt right now and would call us at a later time. [name]

## 2012-07-13 ENCOUNTER — Emergency Department: Payer: Self-pay | Admitting: Emergency Medicine

## 2012-07-14 ENCOUNTER — Encounter: Payer: Self-pay | Admitting: Family Medicine

## 2012-07-14 ENCOUNTER — Ambulatory Visit (INDEPENDENT_AMBULATORY_CARE_PROVIDER_SITE_OTHER): Payer: Medicare Other | Admitting: Family Medicine

## 2012-07-14 ENCOUNTER — Ambulatory Visit: Payer: Medicare HMO | Admitting: Family Medicine

## 2012-07-14 VITALS — BP 118/70 | HR 60 | Temp 98.8°F | Wt 135.0 lb

## 2012-07-14 DIAGNOSIS — R0781 Pleurodynia: Secondary | ICD-10-CM

## 2012-07-14 DIAGNOSIS — R079 Chest pain, unspecified: Secondary | ICD-10-CM

## 2012-07-14 DIAGNOSIS — N390 Urinary tract infection, site not specified: Secondary | ICD-10-CM

## 2012-07-14 MED ORDER — AMOXICILLIN 875 MG PO TABS: 875.0000 mg | ORAL_TABLET | Freq: Two times a day (BID) | ORAL | Status: DC

## 2012-07-14 MED ORDER — OXYCODONE-ACETAMINOPHEN 10-325 MG PO TABS: 1.0000 | ORAL_TABLET | Freq: Four times a day (QID) | ORAL | Status: DC | PRN
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
## 2012-07-24 ENCOUNTER — Telehealth: Payer: Self-pay | Admitting: Family Medicine

## 2012-07-24 NOTE — Telephone Encounter (Signed)
Pt called request a new note to return to rehab, Dr. Patsy Lager wrote a note saying pt isn't allowed to go to rehab for a month but pt said she is doing so good that she wants to go back to rehab and needs a new note saying she has permission to return, please advise

## 2012-07-27 NOTE — Telephone Encounter (Signed)
Herbert Seta, can you double check with her and make sure that her rib pain is ok compared to when i saw her a couple of [place] ago?

## 2012-07-29 ENCOUNTER — Ambulatory Visit: Payer: Medicare Other | Admitting: Cardiology

## 2012-07-30 NOTE — Telephone Encounter (Signed)
Left message for patient to return my call.

## 2012-07-31 ENCOUNTER — Encounter: Payer: Self-pay | Admitting: Cardiology

## 2012-08-01 NOTE — Telephone Encounter (Signed)
TRIED TO REACH PATIENT AND COULDN'T WILL TRY AGAIN LATER

## 2012-08-02 LAB — CBC
HCT: 36.6 % (ref 35.0–47.0)
HGB: 12.5 g/dL (ref 12.0–16.0)
MCH: 31.4 pg (ref 26.0–34.0)
MCHC: 34.1 g/dL (ref 32.0–36.0)
MCV: 92 fL (ref 80–100)
Platelet: 188 10*3/uL (ref 150–440)
RBC: 3.97 10*6/uL (ref 3.80–5.20)
RDW: 14 % (ref 11.5–14.5)
WBC: 6.5 10*3/uL (ref 3.6–11.0)

## 2012-08-02 LAB — CK TOTAL AND CKMB (NOT AT ARMC)
CK, Total: 39 U/L (ref 21–215)
CK-MB: 0.9 ng/mL (ref 0.5–3.6)

## 2012-08-02 LAB — BASIC METABOLIC PANEL
Anion Gap: 5 — ABNORMAL LOW (ref 7–16)
BUN: 17 mg/dL (ref 7–18)
Calcium, Total: 9 mg/dL (ref 8.5–10.1)
Chloride: 104 mmol/L (ref 98–107)
Co2: 31 mmol/L (ref 21–32)
EGFR (African American): >60
EGFR (Non-African Amer.): >60
Glucose: 250 mg/dL — ABNORMAL HIGH (ref 65–99)
Potassium: 3.4 mmol/L — ABNORMAL LOW (ref 3.5–5.1)

## 2012-08-02 LAB — PRO B NATRIURETIC PEPTIDE: B-Type Natriuretic Peptide: 588 pg/mL — ABNORMAL HIGH (ref 0–450)

## 2012-08-02 LAB — TROPONIN I: Troponin-I: <0.02 ng/mL

## 2012-08-03 ENCOUNTER — Inpatient Hospital Stay: Payer: Self-pay | Admitting: Internal Medicine

## 2012-08-04 ENCOUNTER — Ambulatory Visit: Payer: Medicare Other | Admitting: Nurse Practitioner

## 2012-08-04 LAB — CBC WITH DIFFERENTIAL/PLATELET
Basophil #: 0 10*3/uL (ref 0.0–0.1)
Basophil %: 0.1 %
Eosinophil #: 0 10*3/uL (ref 0.0–0.7)
Eosinophil %: 0 %
HCT: 36.5 % (ref 35.0–47.0)
HGB: 12.4 g/dL (ref 12.0–16.0)
Lymphocyte #: 1.5 10*3/uL (ref 1.0–3.6)
Lymphocyte %: 8.7 %
MCH: 30.7 pg (ref 26.0–34.0)
MCHC: 33.9 g/dL (ref 32.0–36.0)
MCV: 91 fL (ref 80–100)
Monocyte #: 1 x10 3/mm — ABNORMAL HIGH (ref 0.2–0.9)
Monocyte %: 6 %
Neutrophil #: 14.2 10*3/uL — ABNORMAL HIGH (ref 1.4–6.5)
Neutrophil %: 85.2 %
Platelet: 200 10*3/uL (ref 150–440)
RBC: 4.03 10*6/uL (ref 3.80–5.20)
RDW: 14.2 % (ref 11.5–14.5)
WBC: 16.7 10*3/uL — ABNORMAL HIGH (ref 3.6–11.0)

## 2012-08-04 LAB — BASIC METABOLIC PANEL
Anion Gap: 6 — ABNORMAL LOW (ref 7–16)
BUN: 20 mg/dL — ABNORMAL HIGH (ref 7–18)
Calcium, Total: 9.2 mg/dL (ref 8.5–10.1)
Chloride: 102 mmol/L (ref 98–107)
Co2: 30 mmol/L (ref 21–32)
Creatinine: 0.69 mg/dL (ref 0.60–1.30)
EGFR (African American): >60
EGFR (Non-African Amer.): >60
Glucose: 165 mg/dL — ABNORMAL HIGH (ref 65–99)
Osmolality: 282 (ref 275–301)
Potassium: 3.3 mmol/L — ABNORMAL LOW (ref 3.5–5.1)
Sodium: 138 mmol/L (ref 136–145)

## 2012-08-04 LAB — TROPONIN I: Troponin-I: <0.02 ng/mL

## 2012-08-04 NOTE — Telephone Encounter (Signed)
Spoke to patient and she is in the hospital and she will talk to you at hospital follow up about the letter for therapy

## 2012-08-04 NOTE — Telephone Encounter (Signed)
ok 

## 2012-08-05 LAB — CBC WITH DIFFERENTIAL/PLATELET
Basophil #: 0 10*3/uL (ref 0.0–0.1)
Basophil %: 0.1 %
Eosinophil %: 0.1 %
HGB: 12.6 g/dL (ref 12.0–16.0)
Lymphocyte #: 2.7 10*3/uL (ref 1.0–3.6)
Lymphocyte %: 19.1 %
MCH: 30.7 pg (ref 26.0–34.0)
MCV: 92 fL (ref 80–100)
Monocyte #: 1 x10 3/mm — ABNORMAL HIGH (ref 0.2–0.9)
Neutrophil #: 10.4 10*3/uL — ABNORMAL HIGH (ref 1.4–6.5)
Neutrophil %: 73.8 %
Platelet: 189 10*3/uL (ref 150–440)
WBC: 14.1 10*3/uL — ABNORMAL HIGH (ref 3.6–11.0)

## 2012-08-05 LAB — MAGNESIUM: Magnesium: 1.8 mg/dL

## 2012-08-06 ENCOUNTER — Other Ambulatory Visit: Payer: Self-pay

## 2012-08-06 ENCOUNTER — Ambulatory Visit (INDEPENDENT_AMBULATORY_CARE_PROVIDER_SITE_OTHER): Payer: Medicare Other | Admitting: Family Medicine

## 2012-08-06 ENCOUNTER — Encounter: Payer: Self-pay | Admitting: Family Medicine

## 2012-08-06 VITALS — BP 120/64 | HR 60 | Temp 97.5°F | Ht 60.0 in | Wt 133.5 lb

## 2012-08-06 DIAGNOSIS — J441 Chronic obstructive pulmonary disease with (acute) exacerbation: Secondary | ICD-10-CM

## 2012-08-06 DIAGNOSIS — I2510 Atherosclerotic heart disease of native coronary artery without angina pectoris: Secondary | ICD-10-CM

## 2012-08-06 DIAGNOSIS — J4489 Other specified chronic obstructive pulmonary disease: Secondary | ICD-10-CM

## 2012-08-06 DIAGNOSIS — I5033 Acute on chronic diastolic (congestive) heart failure: Secondary | ICD-10-CM

## 2012-08-06 DIAGNOSIS — F17200 Nicotine dependence, unspecified, uncomplicated: Secondary | ICD-10-CM

## 2012-08-06 DIAGNOSIS — I509 Heart failure, unspecified: Secondary | ICD-10-CM

## 2012-08-06 DIAGNOSIS — J449 Chronic obstructive pulmonary disease, unspecified: Secondary | ICD-10-CM

## 2012-08-06 MED ORDER — FORMOTEROL FUMARATE 20 MCG/2ML IN NEBU: 20.0000 ug | INHALATION_SOLUTION | Freq: Two times a day (BID) | RESPIRATORY_TRACT | Status: DC

## 2012-08-06 MED ORDER — PREDNISONE 10 MG PO TABS: ORAL_TABLET | ORAL | Status: DC

## 2012-08-06 NOTE — Telephone Encounter (Signed)
[facility] [employer] pharmacist left v/m medicare requires diagnosis on rx for formoterol. Recent with COPD.

## 2012-08-06 NOTE — Progress Notes (Signed)
Nature conservation officer at [facility] [address] [place] [address] Phone: [phone] Fax: [phone]  Date:  08/06/2012   Name:  [name]   DOB:  Jul 31, 1931   MRN:  [id number] Gender: female Age: 79 y.o.  Primary Physician:  Hannah Beat, MD  Evaluating MD: Hannah Beat, MD   Chief Complaint: Follow-up   History of Present Illness:  [name] is a 79 y.o. pleasant patient who presents with the following:  Hospital follow-up:  Complex medical patient with longstanding smoking, COPD, o2-dependent, CAD s/p CABG 01/2012, who was admitted to [facility] 08/03/2012 and discharged on 08/05/2012. She was admitted with a COPD exacerbation and received IV Solumedrol and recovered fairly quickly and was discharged on prednisone.  She does not want to do Spiriva.  She plans on continuing smoking a little bit. Advised not to. She is doing albuterol about every 4 hours, but she has discontinued other inhaled COPD meds. She also says she will not go back to Pulmonary.  Overall, though, she feels pretty well right now.  Patient Active Problem List   Diagnosis Date Noted  . History of non-ST elevation myocardial infarction (NSTEMI) 07/22/2011    Priority: High  . GIB (gastrointestinal bleeding) 04/25/2011    Priority: High  . CAD (coronary artery disease) 09/01/2002    Priority: High  . Smoking 02/09/2011    Priority: Medium  . AAA (abdominal aortic aneurysm) 12/22/2010    Priority: Medium  . COPD 05/31/2004    Priority: Medium  . HYPERLIPIDEMIA 07/02/1999    Priority: Medium  . DIABETES MELLITUS, TYPE II 07/31/1997    Priority: Medium  . HYPERTENSION 08/01/1995    Priority: Medium  . Rheumatoid arthritis 04/03/1983    Priority: Medium  . Depression 06/17/2012  . Hx of CABG 04/18/2012  . Acute on chronic diastolic CHF (congestive heart failure) 02/05/2012  . Acute respiratory failure 02/04/2012  . HCAP (healthcare-associated pneumonia) 02/02/2012  . Splenic  artery aneurysm   . DIVERTICULOSIS OF COLON 05/14/2008  . ANXIETY DEPRESSION 04/19/2008  . ECZEMA 07/21/2007  . ARTERIOVENOUS MALFORMATION, GASTRIC 11/08/2006  . INSOMNIA, CHRONIC 07/21/2006  . ANEMIA-IRON DEFICIENCY 07/01/2004  . FATTY LIVER DISEASE 07/16/2002  . OSTEOPOROSIS 12/31/2000  . OSTEOARTHRITIS 04/03/1983    Past Medical History  Diagnosis Date  . Hypertension 08/01/1995  . Rheumatoid arthritis 04/03/1983  . Osteoarthritis 04/03/1983  . Anemia, deficiency 07/01/2004    (w/u by Dr. Lorre Nick)  . Diabetes mellitus type II 07/31/1997  . Osteoporosis 12/31/2000  . CAD (coronary artery disease) 09/01/2002    a. s/p NSTEMI 2012. b. s/p CABG 01/2012.  [name] Hyperlipemia 07/02/1999  . COPD (chronic obstructive pulmonary disease) 05/31/2004  . diverticulosis of colon   . Arteriovenous malformation of gastrointestinal tract   . Fatty liver   . Chronic back pain   . Splenic artery aneurysm   . Anxiety and depression   . GI bleed     a. 04/2011 felt diverticular requiring microembolization by vascular.  . Adrenal mass     a. Excision 2007.  [name] Restless leg syndrome   . C. difficile colitis     a. 04/2008 = severe.  . Diastolic CHF     a. 02/2012: resp failure with COPD exac/CHF/pleural eff.  [name] Hx of CABG 04/18/2012    Past Surgical History  Procedure Laterality Date  . Nasal sinus surgery    . Inguinal hernia repair    . Partial hysterectomy  ovaries intact, dysmennorhea w/ A/P repari  . Carpal tunnel release      bilat  . Trigger finger release      right  . Wrist surgery      neuroma repair, right  . Ankle surgery      neuroma repair, right ORIF  . Total shoulder replacement  12/13/1998    right, ([place])  . Rotator cuff repair  11/22/2003    ([place]) left  . Epidural block injection  05/2005    x 3  . Long finger tendon realignment  10/31/2005    [place]  . Lumbar epidural  06/13/2006  . Laminectomy  06/2006    L4/5 spinal stenosis (Dr. [name])  .  Laminectomy  02/24/2008    L3/4 Ant/Lat interbody fusion and Lat Artthrodesis w/plate using XLIF (Dr. [name])  . Colonic embolization  04/18/2011    Dr. Wyn Quaker, acute GI bleed  . Spinal cord stimulator implant    . Coronary artery bypass graft  01/22/2012    Procedure: CORONARY ARTERY BYPASS GRAFTING (CABG);  Surgeon: Delight Ovens, MD;  Location: [facility] OR;  Service: Open Heart Surgery;  Laterality: N/A;  Coronary Artery Bypass Graft times four utilizing the left internal mammary artery and the left greater saphenous vein harvested endoscopically.  Rhae Hammock without cardioversion  01/22/2012    Procedure: TRANSESOPHAGEAL ECHOCARDIOGRAM (TEE);  Surgeon: Delight Ovens, MD;  Location: [facility] OR;  Service: Open Heart Surgery;  Laterality: N/A;    History   Social History  . Marital Status: Widowed    Spouse Name: N/A    Number of Children: 4  . Years of Education: N/A   Occupational History  . [employer]     9 hour days-6 days/week   Social History Main Topics  . Smoking status: Current Some Day Smoker -- 0.10 packs/day for 62 years    Types: Cigarettes  . Smokeless tobacco: Never Used     Comment: 04/08/12- smokes 1 cig per day on occ  . Alcohol Use: Yes     Comment: red wine occassionally  . Drug Use: No  . Sexually Active: No   Other Topics Concern  . Not on file   Social History Narrative   Married, widow 5          Family History  Problem Relation Age of Onset  . Hypotension Mother   . Dementia Mother   . Alcohol abuse Father   . Heart disease Father     MI, enlarged heart  . Diabetes Brother     Allergies  Allergen Reactions  . Iron Diarrhea  . Morphine Sulfate Nausea And Vomiting    At high doses hallicuniations  . Naproxen Rash    REACTION: UNSPECIFIED    Medication list has been reviewed and updated.  Outpatient Prescriptions Prior to Visit  Medication Sig Dispense Refill  . albuterol (PROVENTIL) (5 MG/ML) 0.5% nebulizer solution Take 0.5 mLs (2.5 mg total) by  nebulization every 4 (four) hours as needed for wheezing or shortness of breath.  20 mL  1  . ALPRAZolam (XANAX) 0.5 MG tablet Take 1 tablet (0.5 mg total) by mouth 2 (two) times daily as needed for anxiety.  60 tablet  3  . amoxicillin (AMOXIL) 875 MG tablet Take 1 tablet (875 mg total) by mouth 2 (two) times daily.  14 tablet  0  . aspirin EC 81 MG tablet Take 81 mg by mouth daily.      . bisoprolol (ZEBETA) 5 MG  tablet Take 1 tablet (5 mg total) by mouth daily.  30 tablet  6  . citalopram (CELEXA) 10 MG tablet Take 1 tablet (10 mg total) by mouth at bedtime.  30 tablet  3  . fish oil-omega-3 fatty acids 1000 MG capsule Take 1 g by mouth daily.      [name] guaiFENesin (MUCINEX) 600 MG 12 hr tablet Take 600 mg by mouth 2 (two) times daily.       . Multiple Vitamin (MULTIVITAMIN WITH MINERALS) TABS Take 1 tablet by mouth daily.      . nitroGLYCERIN (NITROSTAT) 0.3 MG SL tablet Place 1 tablet (0.3 mg total) under the tongue every 5 (five) minutes as needed for chest pain. Max 3 per day  50 tablet  3  . NON FORMULARY 2 L daily. oxygen      . oxyCODONE-acetaminophen (PERCOCET) 10-325 MG per tablet Take 1 tablet by mouth every 6 (six) hours as needed.  40 tablet  0  . Psyllium (NATURAL FIBER PO) Take 1 capsule by mouth daily.      . ramipril (ALTACE) 5 MG capsule Take 1 capsule (5 mg total) by mouth daily.  30 capsule  6  . saccharomyces boulardii (FLORASTOR) 250 MG capsule Take 250 mg by mouth daily.      . Saline (OCEAN NASAL SPRAY NA) Place 1-2 sprays into the nose daily as needed. For dry nasal passages      . tiotropium (SPIRIVA HANDIHALER) 18 MCG inhalation capsule Place 1 capsule (18 mcg total) into inhaler and inhale daily.  30 capsule  0   No facility-administered medications prior to visit.    Review of Systems:  No fever, chills, sweats. SOB with mild exertion. No swelling. No n/v/d.  Physical Examination: BP 120/64  Pulse 60  Temp(Src) 97.5 F (36.4 C) (Oral)  Ht 5' (1.524 m)  Wt  133 lb 8 oz (60.555 kg)  BMI 26.07 kg/m2  SpO2 93%  Ideal Body Weight: Weight in (lb) to have BMI = 25: 127.7   GEN: WDWN, NAD, Non-toxic, A & O x 3 HEENT: Atraumatic, Normocephalic. Neck supple. No masses, No LAD. Ears and Nose: No external deformity. CV: RRR, No M/G/R. No JVD. No thrill. No extra heart sounds. PULM: Scattered wheezes both sides of lung fields. No retractions. No resp. distress. No accessory muscle use. EXTR: No c/c/e NEURO Normal gait.  PSYCH: Normally interactive. Conversant. Not depressed or anxious appearing.  Calm demeanor.    Assessment and Plan:  COPD exacerbation  COPD  CAD (coronary artery disease)  Acute on chronic diastolic CHF (congestive heart failure)  Smoking  All hospital records reviewed.  Long conversation about COPD meds. She is open to trying formeterol, so given nebs for this. Also given prednisone.   F/u 3 mo  Orders Today:  No orders of the defined types were placed in this encounter.    Updated Medication List: (Includes new medications, updates to list, dose adjustments) Meds ordered this encounter  Medications  . DISCONTD: predniSONE (DELTASONE) 10 MG tablet    Sig: Take 10 mg by mouth daily.  [name] DISCONTD: formoterol (PERFOROMIST) 20 MCG/2ML nebulizer solution    Sig: Take 2 mLs (20 mcg total) by nebulization 2 (two) times daily.    Dispense:  120 mL    Refill:  5  . predniSONE (DELTASONE) 10 MG tablet    Sig: 2 tabs a day for 3 days, then 1 tab a day for 2 days    Dispense:  8 tablet    Refill:  0    Medications Discontinued: Medications Discontinued During This Encounter  Medication Reason  . tiotropium (SPIRIVA HANDIHALER) 18 MCG inhalation capsule   . amoxicillin (AMOXIL) 875 MG tablet   . predniSONE (DELTASONE) 10 MG tablet Reorder      Signed, Karleen Hampshire T. Delaney Perona, MD 08/06/2012 11:07 AM

## 2012-08-07 ENCOUNTER — Telehealth: Payer: Self-pay | Admitting: Family Medicine

## 2012-08-07 NOTE — Telephone Encounter (Signed)
I spoke to the patient face to face on 08/06/2012, and she was d/c from the hospital on 08/05/2012. She was admitted with a COPD exacerbation, and is feeling better and breathing more comfortably now.   Melanie Beat, MD 08/06/2012

## 2012-08-12 ENCOUNTER — Encounter: Payer: Self-pay | Admitting: Cardiology

## 2012-08-12 ENCOUNTER — Ambulatory Visit (INDEPENDENT_AMBULATORY_CARE_PROVIDER_SITE_OTHER): Payer: Medicare Other | Admitting: Cardiology

## 2012-08-12 VITALS — BP 124/58 | HR 57 | Ht 60.0 in | Wt 137.0 lb

## 2012-08-12 DIAGNOSIS — J449 Chronic obstructive pulmonary disease, unspecified: Secondary | ICD-10-CM

## 2012-08-12 DIAGNOSIS — F17200 Nicotine dependence, unspecified, uncomplicated: Secondary | ICD-10-CM

## 2012-08-12 DIAGNOSIS — I10 Essential (primary) hypertension: Secondary | ICD-10-CM

## 2012-08-12 DIAGNOSIS — E785 Hyperlipidemia, unspecified: Secondary | ICD-10-CM

## 2012-08-12 DIAGNOSIS — I2510 Atherosclerotic heart disease of native coronary artery without angina pectoris: Secondary | ICD-10-CM

## 2012-08-12 LAB — LIPID PANEL
HDL: 34.3 mg/dL — ABNORMAL LOW (ref >39.00)
Total CHOL/HDL Ratio: 4
Triglycerides: 114 mg/dL (ref 0.0–149.0)
VLDL: 22.8 mg/dL (ref 0.0–40.0)

## 2012-08-12 LAB — BASIC METABOLIC PANEL
CO2: 32 mEq/L (ref 19–32)
Calcium: 8.9 mg/dL (ref 8.4–10.5)
Creatinine, Ser: 0.8 mg/dL (ref 0.4–1.2)
GFR: 70.53 mL/min (ref >60.00)
Glucose, Bld: 118 mg/dL — ABNORMAL HIGH (ref 70–99)
Sodium: 143 mEq/L (ref 135–145)

## 2012-08-12 MED ORDER — ASPIRIN EC 81 MG PO TBEC: 81.0000 mg | DELAYED_RELEASE_TABLET | Freq: Every day | ORAL | Status: DC

## 2012-08-12 NOTE — Patient Instructions (Addendum)
Your physician recommends that you schedule a follow-up appointment in: 4 MONTHS    Your physician recommends that you return for lab work in: TODAY BMET LIPIDS  Your physician has recommended you make the following change in your medication:  STOP METOPROLOL STOP ALEVE START ASPIRIN 81 MG DAILY

## 2012-08-13 ENCOUNTER — Ambulatory Visit: Payer: Medicare Other | Admitting: Family Medicine

## 2012-08-13 NOTE — Progress Notes (Signed)
Patient ID: [name], female   DOB: 06-23-1931, 79 y.o.   MRN: [id number] PCP: Dr. Patsy Lager  79 yo with history of COPD, HTN, diabetes, and CAD presents for cardiology followup.  Patient was admitted to [facility] with chest pain in 8/12.  Troponin was mildly elevated.  Left heart cath showed severe, diffuse disease in a small RCA that was not amenable to intervention.  This was the likely culprit vessel.  She also had a 70% LAD stenosis.  [name] was then done to assess for ischemia in the LAD distribution.  This showed only a fixed apical perfusion defect with no ischemia (low risk).  She was admitted to [facility] in 1/13 with a lower GI bleed that was thought to be diverticular.  She required blood transfusion.  Due to ongoing episodes of chest pain, I sent her for LHC in 10/13, and she ended up having CABG x 4.  She did well post-op and was sent to a nursing home.  She was, however, readmitted in 11/13 with acute on chronic diastolic CHF and an an acute exacerbation of COPD.  She was diuresed and treated with antibiotics.  Echo in 11/13 showed normal LV systolic function.   She is now home and doing well from a cardiac standpoint.  She had trouble with COPD complications over the winter.  No exertional chest pain.  She feels like she is doing much better since CABG.  She is limited by back and hip pain.  She gets short of breath walking to the mailbox and back, which may be primarily due to COPD.  She is not taking ASA as she thinks we told her to take an Aleve every day rather than ASA.  She is taking two beta blockers (metoprolol and bisoprolol).  She is currently on prednisone for COPD. Weight is down 3 lbs since last appointment.  She quit smoking completely earlier the month.   Labs (8/12): LDL 58, HDL 22 Labs (11/12): LDL 54, HDl 32, creatinine 0.8 Labs (4/13): K 3.5, creatinine 1.3 Labs (7/13): K 3.2, creatinine 1.0, LDL 73, HDL 32 Labs (11/13): K 3.9, creatinine 0.9 Labs (12/13): K 5.8,  creatinine 0.77, BNP 103 Labs (1/14): K 4.4, creatinine 0.9  PMH: 1. COPD 2. HTN 3. Diabetes mellitus type II 4. ? AAA: Patient says she was told by [name] that she has an aneurysm. Abdominal US (10/12) showed no AAA.  5. Chronic low back pain 6. Osteoarthritis: hip pain 7. Anemia  8. Excision of adrenal mass in 2007 9. Restless leg syndrome. 10. Bilateral shoulder replacements.  11. CAD: Cath in 2004 with moderate nonobstructive disease.  Patient was admitted to [facility] in 8/12 with NSTEMI.  LHC (8/12) showed severe diffuse disease of a small RCA.  This was the culprit lesion but it was too small and diffusely diseased for intervention.  There was a 70% proximal LAD stenosis.  Lexiscan myoview (9/12) to assess for LAD territory ischemia showed a small fixed apical defect, low risk.  EF 65% by myoview. CABG 10/13 with LIMA-LAD, SVG-D, SVG-PLOM, and SVG-RCA.  Echo (11/13): EF 55-60%, grade I diastolic dysfunction, PA systolic pressure 38 mmHg.  12. Diverticular bleed 1/13 requiring microembolization by vascular surgery.  13. ABIs (2/13) within normal limits.  14. Chronic diastolic CHF  SH: Quit smoking in 9/12 after NSTEMI.  Restarted to quit again in 5/14.  Lives alone in [place]. 4 children.    FH: Father with MI/CHF, died in his 32s.  ROS: All systems reviewed and negative except as per HPI.    Current Outpatient Prescriptions  Medication Sig Dispense Refill  . albuterol (PROVENTIL) (5 MG/ML) 0.5% nebulizer solution Take 0.5 mLs (2.5 mg total) by nebulization every 4 (four) hours as needed for wheezing or shortness of breath.  20 mL  1  . ALPRAZolam (XANAX) 0.5 MG tablet Take 1 tablet (0.5 mg total) by mouth 2 (two) times daily as needed for anxiety.  60 tablet  3  . atorvastatin (LIPITOR) 40 MG tablet Take 40 mg by mouth daily.      . bisoprolol (ZEBETA) 5 MG tablet Take 1 tablet (5 mg total) by mouth daily.  30 tablet  6  . citalopram (CELEXA) 10 MG tablet Take 1 tablet (10  mg total) by mouth at bedtime.  30 tablet  3  . fish oil-omega-3 fatty acids 1000 MG capsule Take 1 g by mouth daily.      . formoterol (PERFOROMIST) 20 MCG/2ML nebulizer solution Take 2 mLs (20 mcg total) by nebulization 2 (two) times daily.  120 mL  5  . Multiple Vitamin (MULTIVITAMIN WITH MINERALS) TABS Take 1 tablet by mouth daily.      . nitroGLYCERIN (NITROSTAT) 0.3 MG SL tablet Place 1 tablet (0.3 mg total) under the tongue every 5 (five) minutes as needed for chest pain. Max 3 per day  50 tablet  3  . NON FORMULARY 2 L daily. oxygen      . oxyCODONE-acetaminophen (PERCOCET) 10-325 MG per tablet Take 1 tablet by mouth every 6 (six) hours as needed.  40 tablet  0  . ramipril (ALTACE) 5 MG capsule Take 1 capsule (5 mg total) by mouth daily.  30 capsule  6  . aspirin EC 81 MG tablet Take 1 tablet (81 mg total) by mouth daily.  90 tablet  3  . saccharomyces boulardii (FLORASTOR) 250 MG capsule Take 250 mg by mouth daily.      . Saline (OCEAN NASAL SPRAY NA) Place 1-2 sprays into the nose daily as needed. For dry nasal passages       No current facility-administered medications for this visit.    BP 124/58  Pulse 57  Ht 5' (1.524 m)  Wt 137 lb (62.143 kg)  BMI 26.76 kg/m2  SpO2 96% General: elderly, NAD Neck: No JVD, no thyromegaly or thyroid nodule.  Lungs: Prolonged expiratory phase, otherwise clear.  CV: Nondisplaced PMI.  Heart regular S1/S2, no S3/S4, no murmur.  No peripheral edema.  No carotid bruit. Unable to palpate pedal pulses.  Abdomen: Soft, nontender, no hepatosplenomegaly, no distention.  Neurologic: Alert and oriented x 3.  Psych: Normal affect. Extremities: No clubbing or cyanosis.   Assessment/Plan:  CAD  Status post CABG in 10/13 with no ischemic symptoms.  - Continue statin, bisoprolol (beta-1 selective beta blocker), ACEI.  - Stop metoprolol (also taking bisoprolol and bisoprolol is better choice with COPD).  - She needs to stop Aleve and restart ASA 81 mg  daily.  HYPERLIPIDEMIA  Continue atorvastatin, check lipids today.   HYPERTENSION  Blood pressure controlled. Will get BMET given ACEI use.  Smoking  Has finally quit.  Diastolic CHF     She does not appear volume overloaded.  She does not need to be on Lasix.  COPD I think this is the primary culprit currently for her exertional dyspnea.  Marca Ancona 08/13/2012

## 2012-08-14 DIAGNOSIS — J441 Chronic obstructive pulmonary disease with (acute) exacerbation: Secondary | ICD-10-CM

## 2012-08-14 DIAGNOSIS — I2510 Atherosclerotic heart disease of native coronary artery without angina pectoris: Secondary | ICD-10-CM

## 2012-08-14 DIAGNOSIS — J9610 Chronic respiratory failure, unspecified whether with hypoxia or hypercapnia: Secondary | ICD-10-CM

## 2012-08-14 DIAGNOSIS — E119 Type 2 diabetes mellitus without complications: Secondary | ICD-10-CM

## 2012-08-19 ENCOUNTER — Other Ambulatory Visit: Payer: Self-pay | Admitting: *Deleted

## 2012-08-27 ENCOUNTER — Inpatient Hospital Stay: Payer: Self-pay | Admitting: Internal Medicine

## 2012-08-27 LAB — URINALYSIS, COMPLETE
Bilirubin,UR: NEGATIVE
Glucose,UR: NEGATIVE mg/dL (ref 0–75)
Nitrite: NEGATIVE
Squamous Epithelial: 2

## 2012-08-27 LAB — CBC
MCH: 32.2 pg (ref 26.0–34.0)
Platelet: 163 10*3/uL (ref 150–440)

## 2012-08-27 LAB — COMPREHENSIVE METABOLIC PANEL
Albumin: 3.1 g/dL — ABNORMAL LOW (ref 3.4–5.0)
Alkaline Phosphatase: 55 U/L (ref 50–136)
BUN: 16 mg/dL (ref 7–18)
Chloride: 113 mmol/L — ABNORMAL HIGH (ref 98–107)
Glucose: 119 mg/dL — ABNORMAL HIGH (ref 65–99)
Osmolality: 287 (ref 275–301)
Potassium: 3.6 mmol/L (ref 3.5–5.1)
SGOT(AST): 18 U/L (ref 15–37)
SGPT (ALT): 18 U/L (ref 12–78)
Sodium: 143 mmol/L (ref 136–145)
Total Protein: 6.4 g/dL (ref 6.4–8.2)

## 2012-08-27 LAB — CK TOTAL AND CKMB (NOT AT ARMC)
CK, Total: 38 U/L (ref 21–215)
CK-MB: 0.7 ng/mL (ref 0.5–3.6)

## 2012-08-27 LAB — HEMOGLOBIN
HGB: 8.8 g/dL — ABNORMAL LOW (ref 12.0–16.0)
HGB: 9.6 g/dL — ABNORMAL LOW (ref 12.0–16.0)

## 2012-08-27 LAB — TROPONIN I: Troponin-I: <0.02 ng/mL

## 2012-08-28 DIAGNOSIS — R079 Chest pain, unspecified: Secondary | ICD-10-CM

## 2012-08-28 LAB — CBC WITH DIFFERENTIAL/PLATELET
Basophil #: 0 10*3/uL (ref 0.0–0.1)
Eosinophil #: 0.2 10*3/uL (ref 0.0–0.7)
HGB: 8.1 g/dL — ABNORMAL LOW (ref 12.0–16.0)
Lymphocyte #: 1.3 10*3/uL (ref 1.0–3.6)
Lymphocyte %: 26.7 %
MCH: 31.6 pg (ref 26.0–34.0)
Monocyte #: 0.4 x10 3/mm (ref 0.2–0.9)
Monocyte %: 8.1 %
Neutrophil #: 2.9 10*3/uL (ref 1.4–6.5)
RBC: 2.57 10*6/uL — ABNORMAL LOW (ref 3.80–5.20)
RDW: 14.4 % (ref 11.5–14.5)
WBC: 4.9 10*3/uL (ref 3.6–11.0)

## 2012-08-28 LAB — BASIC METABOLIC PANEL
Anion Gap: 4 — ABNORMAL LOW (ref 7–16)
BUN: 10 mg/dL (ref 7–18)
Co2: 26 mmol/L (ref 21–32)
Creatinine: 0.69 mg/dL (ref 0.60–1.30)
EGFR (Non-African Amer.): >60
Glucose: 96 mg/dL (ref 65–99)
Osmolality: 284 (ref 275–301)
Potassium: 3.6 mmol/L (ref 3.5–5.1)
Sodium: 143 mmol/L (ref 136–145)

## 2012-08-28 LAB — HEMOGLOBIN
HGB: 9.5 g/dL — ABNORMAL LOW (ref 12.0–16.0)
HGB: 8.6 g/dL — ABNORMAL LOW (ref 12.0–16.0)

## 2012-08-28 LAB — TROPONIN I: Troponin-I: <0.02 ng/mL

## 2012-08-28 LAB — URINE CULTURE

## 2012-08-29 DIAGNOSIS — R079 Chest pain, unspecified: Secondary | ICD-10-CM

## 2012-08-29 DIAGNOSIS — I359 Nonrheumatic aortic valve disorder, unspecified: Secondary | ICD-10-CM

## 2012-08-29 LAB — HEMOGLOBIN: HGB: 8.4 g/dL — ABNORMAL LOW (ref 12.0–16.0)

## 2012-08-30 LAB — CBC WITH DIFFERENTIAL/PLATELET
Basophil #: 0 10*3/uL (ref 0.0–0.1)
Basophil %: 0.6 %
Eosinophil %: 6.5 %
MCHC: 34.7 g/dL (ref 32.0–36.0)
Neutrophil #: 2.8 10*3/uL (ref 1.4–6.5)
Platelet: 127 10*3/uL — ABNORMAL LOW (ref 150–440)
RBC: 2.88 10*6/uL — ABNORMAL LOW (ref 3.80–5.20)
RDW: 14.4 % (ref 11.5–14.5)
WBC: 5.2 10*3/uL (ref 3.6–11.0)

## 2012-09-01 ENCOUNTER — Encounter: Payer: Self-pay | Admitting: Cardiology

## 2012-09-03 DIAGNOSIS — J441 Chronic obstructive pulmonary disease with (acute) exacerbation: Secondary | ICD-10-CM

## 2012-09-12 ENCOUNTER — Encounter: Payer: Self-pay | Admitting: Radiology

## 2012-09-15 ENCOUNTER — Ambulatory Visit: Payer: Medicare Other | Admitting: Family Medicine

## 2012-09-23 ENCOUNTER — Encounter: Payer: Self-pay | Admitting: Radiology

## 2012-09-24 ENCOUNTER — Encounter: Payer: Self-pay | Admitting: Family Medicine

## 2012-09-24 ENCOUNTER — Ambulatory Visit (INDEPENDENT_AMBULATORY_CARE_PROVIDER_SITE_OTHER): Payer: Medicare Other | Admitting: Family Medicine

## 2012-09-24 VITALS — BP 142/70 | HR 60 | Temp 98.4°F | Wt 143.0 lb

## 2012-09-24 DIAGNOSIS — D62 Acute posthemorrhagic anemia: Secondary | ICD-10-CM

## 2012-09-24 DIAGNOSIS — I2510 Atherosclerotic heart disease of native coronary artery without angina pectoris: Secondary | ICD-10-CM

## 2012-09-24 DIAGNOSIS — E119 Type 2 diabetes mellitus without complications: Secondary | ICD-10-CM

## 2012-09-24 DIAGNOSIS — Z8719 Personal history of other diseases of the digestive system: Secondary | ICD-10-CM

## 2012-09-24 DIAGNOSIS — N390 Urinary tract infection, site not specified: Secondary | ICD-10-CM

## 2012-09-24 DIAGNOSIS — J449 Chronic obstructive pulmonary disease, unspecified: Secondary | ICD-10-CM

## 2012-09-24 DIAGNOSIS — K922 Gastrointestinal hemorrhage, unspecified: Secondary | ICD-10-CM

## 2012-09-24 NOTE — Progress Notes (Signed)
Nature conservation officer at [employer] [address] [place] [address] Phone: [phone] Fax: [phone]  Date:  09/24/2012   Name:  [name]   DOB:  11-20-1931   MRN:  [id number] Gender: female Age: 79 y.o.  Primary Physician:  Hannah Beat, MD  Evaluating MD: Hannah Beat, MD   Chief Complaint: Hospitalization Follow-up   History of Present Illness:  [name] is a 79 y.o. pleasant patient who presents with the following:  Hospital follow-up: GI bleed.  Complex medical patient with a history of multiple GI bleeds in the past with admission 08/27/2012 and discharge 08/31/2012 from [facility] and found to have an acute diverticular bleed that ultimately required 3 units of PRBC's and a d/c hemoglobin of 9. Pos GI scan in the hospital. Had some chest pain in the hopital, and was transfused in the setting of acute anemia.  Chest pain resolved. Dr. Kirke Corin did see her in the hospital.   Now she is feeling " back to her old self."  Also had a UTI in the hospital and treated.    Left hip and lower back. Sees Dr. [name].   Patient Active Problem List   Diagnosis Date Noted  . History of non-ST elevation myocardial infarction (NSTEMI) 07/22/2011    Priority: High  . GIB (gastrointestinal bleeding) 04/25/2011    Priority: High  . CAD (coronary artery disease) 09/01/2002    Priority: High  . Smoking 02/09/2011    Priority: Medium  . AAA (abdominal aortic aneurysm) 12/22/2010    Priority: Medium  . COPD 05/31/2004    Priority: Medium  . HYPERLIPIDEMIA 07/02/1999    Priority: Medium  . DIABETES MELLITUS, TYPE II 07/31/1997    Priority: Medium  . HYPERTENSION 08/01/1995    Priority: Medium  . Rheumatoid arthritis 04/03/1983    Priority: Medium  . Depression 06/17/2012  . Hx of CABG 04/18/2012  . Acute on chronic diastolic CHF (congestive heart failure) 02/05/2012  . Acute respiratory failure 02/04/2012  . HCAP (healthcare-associated pneumonia) 02/02/2012  .  Splenic artery aneurysm   . DIVERTICULOSIS OF COLON 05/14/2008  . ANXIETY DEPRESSION 04/19/2008  . ECZEMA 07/21/2007  . ARTERIOVENOUS MALFORMATION, GASTRIC 11/08/2006  . INSOMNIA, CHRONIC 07/21/2006  . ANEMIA-IRON DEFICIENCY 07/01/2004  . FATTY LIVER DISEASE 07/16/2002  . OSTEOPOROSIS 12/31/2000  . OSTEOARTHRITIS 04/03/1983    Past Medical History  Diagnosis Date  . Hypertension 08/01/1995  . Rheumatoid arthritis(714.0) 04/03/1983  . Osteoarthritis 04/03/1983  . Anemia, deficiency 07/01/2004    (w/u by Dr. Lorre Nick)  . Diabetes mellitus type II 07/31/1997  . Osteoporosis 12/31/2000  . CAD (coronary artery disease) 09/01/2002    a. s/p NSTEMI 2012. b. s/p CABG 01/2012.  [name] Hyperlipemia 07/02/1999  . COPD (chronic obstructive pulmonary disease) 05/31/2004  . diverticulosis of colon   . Arteriovenous malformation of gastrointestinal tract   . Fatty liver   . Chronic back pain   . Splenic artery aneurysm   . Anxiety and depression   . GI bleed     a. 04/2011 felt diverticular requiring microembolization by vascular.  . Adrenal mass     a. Excision 2007.  [name] Restless leg syndrome   . C. difficile colitis     a. 04/2008 = severe.  . Diastolic CHF     a. 02/2012: resp failure with COPD exac/CHF/pleural eff.  [name] Hx of CABG 04/18/2012    Past Surgical History  Procedure Laterality Date  . Nasal sinus surgery    .  Inguinal hernia repair    . Partial hysterectomy      ovaries intact, dysmennorhea w/ A/P repari  . Carpal tunnel release      bilat  . Trigger finger release      right  . Wrist surgery      neuroma repair, right  . Ankle surgery      neuroma repair, right ORIF  . Total shoulder replacement  12/13/1998    right, ([place])  . Rotator cuff repair  11/22/2003    ([place]) left  . Epidural block injection  05/2005    x 3  . Long finger tendon realignment  10/31/2005    [place]  . Lumbar epidural  06/13/2006  . Laminectomy  06/2006    L4/5 spinal stenosis (Dr.  [name])  . Laminectomy  02/24/2008    L3/4 Ant/Lat interbody fusion and Lat Artthrodesis w/plate using XLIF (Dr. [name])  . Colonic embolization  04/18/2011    Dr. Wyn Quaker, acute GI bleed  . Spinal cord stimulator implant    . Coronary artery bypass graft  01/22/2012    Procedure: CORONARY ARTERY BYPASS GRAFTING (CABG);  Surgeon: Delight Ovens, MD;  Location: [facility] OR;  Service: Open Heart Surgery;  Laterality: N/A;  Coronary Artery Bypass Graft times four utilizing the left internal mammary artery and the left greater saphenous vein harvested endoscopically.  Rhae Hammock without cardioversion  01/22/2012    Procedure: TRANSESOPHAGEAL ECHOCARDIOGRAM (TEE);  Surgeon: Delight Ovens, MD;  Location: [facility] OR;  Service: Open Heart Surgery;  Laterality: N/A;    History   Social History  . Marital Status: Widowed    Spouse Name: N/A    Number of Children: 4  . Years of Education: N/A   Occupational History  . [employer]     9 hour days-6 days/week   Social History Main Topics  . Smoking status: Former Smoker -- 0.10 packs/day for 62 years    Types: Cigarettes    Quit date: 08/01/2012  . Smokeless tobacco: Never Used  . Alcohol Use: Yes     Comment: red wine occassionally  . Drug Use: No  . Sexually Active: No   Other Topics Concern  . Not on file   Social History Narrative   Married, widow 56          Family History  Problem Relation Age of Onset  . Hypotension Mother   . Dementia Mother   . Alcohol abuse Father   . Heart disease Father     MI, enlarged heart  . Diabetes Brother     Allergies  Allergen Reactions  . Iron Diarrhea  . Morphine Sulfate Nausea And Vomiting    At high doses hallicuniations  . Naproxen Rash    REACTION: UNSPECIFIED    Medication list has been reviewed and updated.  Outpatient Prescriptions Prior to Visit  Medication Sig Dispense Refill  . albuterol (PROVENTIL) (5 MG/ML) 0.5% nebulizer solution Take 0.5 mLs (2.5 mg total) by nebulization every 4 (four)  hours as needed for wheezing or shortness of breath.  20 mL  1  . ALPRAZolam (XANAX) 0.5 MG tablet Take 1 tablet (0.5 mg total) by mouth 2 (two) times daily as needed for anxiety.  60 tablet  3  . aspirin EC 81 MG tablet Take 1 tablet (81 mg total) by mouth daily.  90 tablet  3  . atorvastatin (LIPITOR) 40 MG tablet Take 40 mg by mouth daily.      . bisoprolol (ZEBETA)  5 MG tablet Take 1 tablet (5 mg total) by mouth daily.  30 tablet  6  . citalopram (CELEXA) 10 MG tablet Take 1 tablet (10 mg total) by mouth at bedtime.  30 tablet  3  . fish oil-omega-3 fatty acids 1000 MG capsule Take 1 g by mouth daily.      . formoterol (PERFOROMIST) 20 MCG/2ML nebulizer solution Take 2 mLs (20 mcg total) by nebulization 2 (two) times daily.  120 mL  5  . Multiple Vitamin (MULTIVITAMIN WITH MINERALS) TABS Take 1 tablet by mouth daily.      . nitroGLYCERIN (NITROSTAT) 0.3 MG SL tablet Place 1 tablet (0.3 mg total) under the tongue every 5 (five) minutes as needed for chest pain. Max 3 per day  50 tablet  3  . NON FORMULARY 2 L daily. oxygen      . oxyCODONE-acetaminophen (PERCOCET) 10-325 MG per tablet Take 1 tablet by mouth every 6 (six) hours as needed.  40 tablet  0  . ramipril (ALTACE) 5 MG capsule Take 1 capsule (5 mg total) by mouth daily.  30 capsule  6  . saccharomyces boulardii (FLORASTOR) 250 MG capsule Take 250 mg by mouth daily.      . Saline (OCEAN NASAL SPRAY NA) Place 1-2 sprays into the nose daily as needed. For dry nasal passages       No facility-administered medications prior to visit.    Review of Systems:   GEN: No acute illnesses, no fevers, chills. Tired somewhat.  GI: No n/v/d, eating normally Pulm: No SOB Interactive and getting along well at home.  Otherwise, ROS is as per the HPI.   Physical Examination: BP 142/70  Pulse 60  Temp(Src) 98.4 F (36.9 C)  Wt 143 lb (64.864 kg)  BMI 27.93 kg/m2  SpO2 95%  Ideal Body Weight:     GEN: WDWN, NAD, Non-toxic, A & O x  3 HEENT: Atraumatic, Normocephalic. Neck supple. No masses, No LAD. Ears and Nose: No external deformity. CV: RRR, No M/G/R. No JVD. No thrill. No extra heart sounds. PULM: CTA B, no wheezes, crackles, rhonchi. No retractions. No resp. distress. No accessory muscle use. EXTR: No c/c/e NEURO Normal gait.  PSYCH: Normally interactive. Conversant. Not depressed or anxious appearing.  Calm demeanor.    Assessment and Plan:  GIB (gastrointestinal bleeding)  History of GI diverticular bleed  Acute post-hemorrhagic anemia  UTI (urinary tract infection)  CAD (coronary artery disease)  DIABETES MELLITUS, TYPE II  COPD  >25 minutes spent in face to face time with patient, >50% spent in counselling or coordination of care: she overall looks well today. Discussed with patient history, extensive record review while patient in the office. HgB has already been rechecked by GI and it is stable. Maintain activities as tolerated.   F/u 3 mo.  Orders Today:  No orders of the defined types were placed in this encounter.    Updated Medication List: (Includes new medications, updates to list, dose adjustments) No orders of the defined types were placed in this encounter.    Medications Discontinued: There are no discontinued medications.    Signed, Elpidio Galea. Talton Delpriore, MD 09/24/2012 12:33 PM

## 2012-09-25 ENCOUNTER — Other Ambulatory Visit: Payer: Self-pay | Admitting: *Deleted

## 2012-09-25 DIAGNOSIS — Z8719 Personal history of other diseases of the digestive system: Secondary | ICD-10-CM | POA: Insufficient documentation

## 2012-09-25 MED ORDER — RAMIPRIL 5 MG PO CAPS: 5.0000 mg | ORAL_CAPSULE | Freq: Every day | ORAL | Status: DC

## 2012-09-30 ENCOUNTER — Encounter: Payer: Self-pay | Admitting: Cardiology

## 2012-10-01 ENCOUNTER — Encounter: Payer: Self-pay | Admitting: Family Medicine

## 2012-10-15 ENCOUNTER — Ambulatory Visit: Payer: Medicare Other | Admitting: Cardiology

## 2012-10-21 ENCOUNTER — Ambulatory Visit: Payer: Medicare Other | Admitting: Physician Assistant

## 2012-10-31 ENCOUNTER — Encounter: Payer: Self-pay | Admitting: Cardiology

## 2012-11-04 ENCOUNTER — Telehealth: Payer: Self-pay | Admitting: Family Medicine

## 2012-11-04 NOTE — Telephone Encounter (Signed)
Routed to [place], front office staff to call pt and schedule appt.

## 2012-11-04 NOTE — Telephone Encounter (Signed)
Left message for pt to call back and schedule appt. Offered Dr. Daphine Deutscher 11:30 or 2:30 slot. Also stated in message that if those slots were not available at the time she calls back we could look at another providers schedule.

## 2012-11-04 NOTE — Telephone Encounter (Signed)
Patient Information:  Caller Name: Amaziah  Phone: [phone]  Patient: [name], [name]  Gender: Female  DOB: 1932-03-20  Age: 79 Years  PCP: Hannah Beat [facility])  Office Follow Up:  Does the office need to follow up with this patient?: Yes  Instructions For The Office: Declined appointment at another [place] location. Has go to office now disposition. Willing to wait until 11/05/12 for appointment. PLEASE CALL [place] regarding when and where to be seen   Symptoms  Reason For Call & Symptoms: [place] states she is having severe pressure when she urinates onset 11/02/12. Voiding small amounts.  Has tried AZO with no relief. Per urination pain protocol has go to office now protocol due to severe pressure an with urination. No appointments available. Refuses to go to another [place] location. ALAYSHIA MARINI AT [phone]  Reviewed Health History In EMR: Yes  Reviewed Medications In EMR: Yes  Reviewed Allergies In EMR: Yes  Reviewed Surgeries / Procedures: Yes  Date of Onset of Symptoms: 11/02/2012  Treatments Tried: Azo  Treatments Tried Worked: No  Guideline(s) Used:  Urination Pain - Female  Disposition Per Guideline:   Go to Office Now  Reason For Disposition Reached:   Severe pain with urination  Advice Given:  N/A  Patient Will Follow Care Advice:  YES

## 2012-11-07 ENCOUNTER — Encounter: Payer: Self-pay | Admitting: Family Medicine

## 2012-11-07 ENCOUNTER — Ambulatory Visit (INDEPENDENT_AMBULATORY_CARE_PROVIDER_SITE_OTHER): Payer: Medicare Other | Admitting: Family Medicine

## 2012-11-07 VITALS — BP 122/62 | HR 52 | Temp 98.2°F | Wt 140.0 lb

## 2012-11-07 DIAGNOSIS — R300 Dysuria: Secondary | ICD-10-CM

## 2012-11-07 DIAGNOSIS — N390 Urinary tract infection, site not specified: Secondary | ICD-10-CM | POA: Insufficient documentation

## 2012-11-07 LAB — POCT URINALYSIS DIPSTICK
Glucose, UA: NEGATIVE
Ketones, UA: NEGATIVE
Spec Grav, UA: 1.025
Urobilinogen, UA: NEGATIVE

## 2012-11-07 MED ORDER — SULFAMETHOXAZOLE-TMP DS 800-160 MG PO TABS: 1.0000 | ORAL_TABLET | Freq: Two times a day (BID) | ORAL | Status: DC

## 2012-11-07 NOTE — Patient Instructions (Signed)
Complete antibiotics.  Push fluids.  Follow up if symptoms not improving as expected.

## 2012-11-07 NOTE — Progress Notes (Signed)
  Subjective:    Patient ID: [name], female    DOB: 1931/06/07, 79 y.o.   MRN: [id number]  Dysuria  This is a new problem. The problem occurs every urination. The problem has been gradually improving. Quality: urinary pressure, no burning. The patient is experiencing no pain. There has been no fever (subjective). She is not sexually active. There is no history of pyelonephritis. Associated symptoms include frequency and urgency. Pertinent negatives include no chills, discharge, flank pain, hematuria, nausea or vomiting. Treatments tried: AZO. The treatment provided mild relief. Her past medical history is significant for recurrent UTIs. There is no history of catheterization, kidney stones, a single kidney, urinary stasis or a urological procedure. hx of UTI, last UTI in 07/2012 proteus, rest to nitrofurantoin and cipro      Review of Systems  Constitutional: Negative for chills.  Gastrointestinal: Negative for nausea and vomiting.  Genitourinary: Positive for dysuria, urgency and frequency. Negative for hematuria and flank pain.       Objective:   Physical Exam  Constitutional: Vital signs are normal. She appears well-developed and well-nourished. She is cooperative.  Non-toxic appearance. She does not appear ill. No distress.  HENT:  Head: Normocephalic.  Right Ear: Hearing, tympanic membrane, external ear and ear canal normal. Tympanic membrane is not erythematous, not retracted and not bulging.  Left Ear: Hearing, tympanic membrane, external ear and ear canal normal. Tympanic membrane is not erythematous, not retracted and not bulging.  Nose: No mucosal edema or rhinorrhea. Right sinus exhibits no maxillary sinus tenderness and no frontal sinus tenderness. Left sinus exhibits no maxillary sinus tenderness and no frontal sinus tenderness.  Mouth/Throat: Uvula is midline, oropharynx is clear and moist and mucous membranes are normal.  Eyes: Conjunctivae, EOM and lids are normal.  Pupils are equal, round, and reactive to light. No foreign bodies found.  Neck: Trachea normal and normal range of motion. Neck supple. Carotid bruit is not present. No mass and no thyromegaly present.  Cardiovascular: Normal rate, regular rhythm, S1 normal, S2 normal, normal heart sounds, intact distal pulses and normal pulses.  Exam reveals no gallop and no friction rub.   No murmur heard. Pulmonary/Chest: Effort normal and breath sounds normal. Not tachypneic. No respiratory distress. She has no decreased breath sounds. She has no wheezes. She has no rhonchi. She has no rales.  Abdominal: Soft. Normal appearance and bowel sounds are normal. There is tenderness in the suprapubic area. There is no rebound and no CVA tenderness.  Neurological: She is alert.  Skin: Skin is warm, dry and intact. No rash noted.  Psychiatric: Her speech is normal and behavior is normal. Judgment and thought content normal. Her mood appears not anxious. Cognition and memory are normal. She does not exhibit a depressed mood.          Assessment & Plan:

## 2012-11-07 NOTE — Assessment & Plan Note (Signed)
Send for culture. Previous bacteria has been res to cipro, nitro.

## 2012-11-10 LAB — URINE CULTURE: Colony Count: >=100000

## 2012-11-24 ENCOUNTER — Encounter: Payer: Self-pay | Admitting: Physician Assistant

## 2012-11-24 ENCOUNTER — Ambulatory Visit (INDEPENDENT_AMBULATORY_CARE_PROVIDER_SITE_OTHER): Payer: Medicare Other | Admitting: Physician Assistant

## 2012-11-24 VITALS — BP 156/72 | HR 63 | Ht 60.0 in | Wt 140.0 lb

## 2012-11-24 DIAGNOSIS — E785 Hyperlipidemia, unspecified: Secondary | ICD-10-CM

## 2012-11-24 DIAGNOSIS — Z8719 Personal history of other diseases of the digestive system: Secondary | ICD-10-CM

## 2012-11-24 DIAGNOSIS — J449 Chronic obstructive pulmonary disease, unspecified: Secondary | ICD-10-CM

## 2012-11-24 DIAGNOSIS — I10 Essential (primary) hypertension: Secondary | ICD-10-CM

## 2012-11-24 DIAGNOSIS — J4489 Other specified chronic obstructive pulmonary disease: Secondary | ICD-10-CM

## 2012-11-24 DIAGNOSIS — I5032 Chronic diastolic (congestive) heart failure: Secondary | ICD-10-CM

## 2012-11-24 DIAGNOSIS — I2510 Atherosclerotic heart disease of native coronary artery without angina pectoris: Secondary | ICD-10-CM

## 2012-11-24 MED ORDER — BISOPROLOL FUMARATE 5 MG PO TABS: ORAL_TABLET | ORAL | Status: DC

## 2012-11-24 NOTE — Patient Instructions (Addendum)
RESTART THE BISOPROLOL 5 MG 1/2 TABLET DAILY  Your physician recommends that you schedule a follow-up appointment in: 3 MONTHS WITH DR [facility]

## 2012-11-24 NOTE — Progress Notes (Signed)
[address] [place], [place]  [address] Phone: [phone] Fax:  [phone]  Date:  11/24/2012   ID:  Melanie, Delacruz [birth year], MRN [id number]  PCP:  Hannah Beat, MD  Cardiologist:  Dr. Marca Ancona     History of Present Illness: [name] is a 79 y.o. female who returns for follow up.    She has a hx of COPD, HTN, DM2, and CAD.  She was admitted to [facility] with chest pain in 11/2010. Troponin was mildly elevated. LHC showed severe, diffuse disease in a small RCA that was not amenable to intervention. This was the likely culprit vessel. She also had a 70% LAD stenosis. [name] was then done to assess for ischemia in the LAD distribution. This showed only a fixed apical perfusion defect with no ischemia (low risk). She was admitted to [facility] in 04/2011 with a LGI bleed that was thought to be diverticular. She required blood transfusion. Due to ongoing episodes of chest pain, she was sent for [facility] in 10/13, and she ended up having CABG x 4. She did well post-op and was sent to a nursing home. She was, however, readmitted in 11/13 with a/c diastolic CHF and an an acute exacerbation of COPD. She was diuresed and treated with antibiotics. Echo in 11/13 showed normal LV systolic function.   Last seen by Dr. Shirlee Latch 08/12/12. Follow up was recommended in 4 months. She was admitted to [facility] 5/28-6/1 with lower GI bleeding from diverticula. She received 3 units PRBCs and discharge hemoglobin was 9. She was apparently seen by Dr. Lorine Bears for chest pain during her admission. Cardiac markers remained normal. Echocardiogram 08/29/12: EF 55-60%, mild LVH, mild LAE, impaired LV relaxation.  She is overall doing well.  The patient denies chest pain, shortness of breath, syncope, orthopnea, PND or significant pedal edema.   Of note, she is still using her home O2 at night.  She feels like her breathing is improved with this.  She is using her portable O2 tank at  home.  Labs (8/12): LDL 58, HDL 22  Labs (11/12): LDL 54, HDl 32, creatinine 0.8  Labs (4/13): K 3.5, creatinine 1.3  Labs (7/13): K 3.2, creatinine 1.0, LDL 73, HDL 32  Labs (11/13): K 3.9, creatinine 0.9  Labs (12/13): K 5.8, creatinine 0.77, BNP 103  Labs (1/14): K 4.4, creatinine 0.9  Labs (5/14):  K 3.9, Cr 0.8, LDL 66   Wt Readings from Last 3 Encounters:  11/07/12 140 lb (63.504 kg)  09/24/12 143 lb (64.864 kg)  08/12/12 137 lb (62.143 kg)     Past Medical History:  1. COPD  2. HTN  3. Diabetes mellitus type II  4. ? AAA: Patient says she was told by [name] that she has an aneurysm. Abdominal US (10/12) showed no AAA.  5. Chronic low back pain  6. Osteoarthritis: hip pain  7. Anemia  8. Excision of adrenal mass in 2007  9. Restless leg syndrome.  10. Bilateral shoulder replacements.  11. CAD: Cath in 2004 with moderate nonobstructive disease. Patient was admitted to [facility] in 8/12 with NSTEMI. LHC (8/12) showed severe diffuse disease of a small RCA. This was the culprit lesion but it was too small and diffusely diseased for intervention. There was a 70% proximal LAD stenosis. Lexiscan myoview (9/12) to assess for LAD territory ischemia showed a small fixed apical defect, low risk. EF 65% by myoview. CABG 10/13 with LIMA-LAD, SVG-D, SVG-PLOM,  and SVG-RCA. Echo (11/13): EF 55-60%, grade I diastolic dysfunction, PA systolic pressure 38 mmHg.  12. Diverticular bleed 1/13 requiring microembolization by vascular surgery.  13. ABIs (2/13) within normal limits.  14. Chronic diastolic CHF   Past Medical History  Diagnosis Date  . Hypertension 08/01/1995  . Rheumatoid arthritis(714.0) 04/03/1983  . Osteoarthritis 04/03/1983  . Anemia, deficiency 07/01/2004    (w/u by Dr. Lorre Nick)  . Diabetes mellitus type II 07/31/1997  . Osteoporosis 12/31/2000  . CAD (coronary artery disease) 09/01/2002    a. s/p NSTEMI 2012. b. s/p CABG 01/2012.  [name] Hyperlipemia 07/02/1999  . COPD  (chronic obstructive pulmonary disease) 05/31/2004  . diverticulosis of colon   . Arteriovenous malformation of gastrointestinal tract   . Fatty liver   . Chronic back pain   . Splenic artery aneurysm   . Anxiety and depression   . GI bleed     a. 04/2011 felt diverticular requiring microembolization by vascular.  . Adrenal mass     a. Excision 2007.  [name] Restless leg syndrome   . C. difficile colitis     a. 04/2008 = severe.  . Diastolic CHF     a. 02/2012: resp failure with COPD exac/CHF/pleural eff.  [name] Hx of CABG 04/18/2012    Current Outpatient Prescriptions  Medication Sig Dispense Refill  . aspirin EC 81 MG tablet Take one by mouth every other day      . atorvastatin (LIPITOR) 40 MG tablet Take 40 mg by mouth daily.      . fish oil-omega-3 fatty acids 1000 MG capsule Take 1 g by mouth daily.      . Multiple Vitamin (MULTIVITAMIN WITH MINERALS) TABS Take 1 tablet by mouth daily.      . nitroGLYCERIN (NITROSTAT) 0.3 MG SL tablet Place 1 tablet (0.3 mg total) under the tongue every 5 (five) minutes as needed for chest pain. Max 3 per day  50 tablet  3  . Oxycodone HCl 10 MG TABS Take 1 to 2 every four to six hours      . psyllium (METAMUCIL) 58.6 % packet Take 1 packet by mouth 2 (two) times daily.      . ramipril (ALTACE) 5 MG capsule Take 1 capsule (5 mg total) by mouth daily.  30 capsule  6  . sulfamethoxazole-trimethoprim (BACTRIM DS) 800-160 MG per tablet Take 1 tablet by mouth 2 (two) times daily.  14 tablet  0   No current facility-administered medications for this visit.    Allergies:    Allergies  Allergen Reactions  . Iron Diarrhea  . Morphine Sulfate Nausea And Vomiting    At high doses hallicuniations  . Naproxen Rash    REACTION: UNSPECIFIED    Social History:  The patient  reports that she quit smoking about 3 months ago. Her smoking use included Cigarettes. She has a 6.2 pack-year smoking history. She has never used smokeless tobacco. She reports that  drinks  alcohol. She reports that she does not use illicit drugs.   ROS:  Please see the history of present illness.   All other systems reviewed and negative.   PHYSICAL EXAM: VS:  BP 156/72  Pulse 63  Ht 5' (1.524 m)  Wt 140 lb (63.504 kg)  BMI 27.34 kg/m2 Well nourished, well developed, in no acute distress HEENT: normal Neck: no JVD Cardiac:  normal S1, S2; RRR; no murmur Lungs:  clear to auscultation bilaterally, no wheezing, rhonchi or rales Abd: soft, nontender, no hepatomegaly  Ext: no edema Skin: warm and dry Neuro:  CNs 2-12 intact, no focal abnormalities noted  EKG:  NSR, HR 63, normal axis, NSSTTW changes     ASSESSMENT AND PLAN:  1. CAD:  No angina.  Continue ASA and statin.  She is no longer on a beta blocker for unclear reasons.  Will restart Bisoprolol 2.5 mg QD. 2. Hypertension:  BP elevated. Continue ACE. Restart selective beta blocker. 3. Hyperlipidemia:  Recent LDL optimal.  Continue statin. 4. Chronic Diastolic CHF:  Volume stable.  Continue current Rx. 5. COPD:  She has continued to refrain from smoking cigarettes. 6. Recurrent Lower GI Bleeding:  She tells me that her surgeon plans partial colectomy if she has another bleed. 7. Disposition:  F/u with Dr. Marca Ancona in 3 mos.   Signed, Tereso Newcomer, PA-C  11/24/2012 2:25 PM

## 2012-12-02 ENCOUNTER — Encounter: Payer: Self-pay | Admitting: Family Medicine

## 2012-12-02 ENCOUNTER — Ambulatory Visit (INDEPENDENT_AMBULATORY_CARE_PROVIDER_SITE_OTHER): Payer: Medicare Other | Admitting: Family Medicine

## 2012-12-02 VITALS — BP 128/60 | HR 58 | Temp 98.5°F | Ht 60.0 in | Wt 143.5 lb

## 2012-12-02 DIAGNOSIS — Z23 Encounter for immunization: Secondary | ICD-10-CM

## 2012-12-02 DIAGNOSIS — J019 Acute sinusitis, unspecified: Secondary | ICD-10-CM

## 2012-12-02 NOTE — Addendum Note (Signed)
Addended by: Damita Lack on: 12/02/2012 04:22 PM   Modules accepted: Orders

## 2012-12-02 NOTE — Progress Notes (Signed)
  Subjective:    Patient ID: [name], female    DOB: [birth year], 79 y.o.   MRN: [id number]  Sinusitis This is a new problem. The current episode started in the past 7 days (5 days ). The problem has been gradually worsening since onset. There has been no fever. Associated symptoms include congestion, headaches and sinus pressure. Pertinent negatives include no coughing, ear pain, hoarse voice, neck pain, shortness of breath, sneezing, sore throat or swollen glands. (Right facial tenderness and fullness  right ear fullness.  sharp pain in right alteral head) Treatments tried: benadryl at night. The treatment provided no relief (helped her sleep).   Has upcoming spinal nerve block and spinal surgery upcoming.   Review of Systems  HENT: Positive for congestion and sinus pressure. Negative for ear pain, sore throat, hoarse voice, sneezing and neck pain.   Respiratory: Negative for cough and shortness of breath.   Neurological: Positive for headaches. Negative for dizziness, syncope, facial asymmetry, speech difficulty, weakness and numbness.       Objective:   Physical Exam  Constitutional: She is oriented to person, place, and time. Vital signs are normal. She appears well-developed and well-nourished. She is cooperative.  Non-toxic appearance. She does not appear ill. No distress.  HENT:  Head: Normocephalic.  Right Ear: Hearing, tympanic membrane, external ear and ear canal normal. Tympanic membrane is not erythematous, not retracted and not bulging.  Left Ear: Hearing, tympanic membrane, external ear and ear canal normal. Tympanic membrane is not erythematous, not retracted and not bulging.  Nose: Mucosal edema and rhinorrhea present. Right sinus exhibits no maxillary sinus tenderness and no frontal sinus tenderness. Left sinus exhibits no maxillary sinus tenderness and no frontal sinus tenderness.  Mouth/Throat: Uvula is midline, oropharynx is clear and moist and mucous membranes  are normal.  Eyes: Conjunctivae, EOM and lids are normal. Pupils are equal, round, and reactive to light. Lids are everted and swept, no foreign bodies found.  Neck: Trachea normal and normal range of motion. Neck supple. Carotid bruit is not present. No mass and no thyromegaly present.  Cardiovascular: Normal rate, regular rhythm, S1 normal, S2 normal, normal heart sounds, intact distal pulses and normal pulses.  Exam reveals no gallop and no friction rub.   No murmur heard. Pulmonary/Chest: Effort normal and breath sounds normal. Not tachypneic. No respiratory distress. She has no decreased breath sounds. She has no wheezes. She has no rhonchi. She has no rales.  Neurological: She is alert and oriented to person, place, and time. She has normal strength. No cranial nerve deficit or sensory deficit.  Skin: Skin is warm, dry and intact. No rash noted.  Psychiatric: Her speech is normal and behavior is normal. Judgment normal. Her mood appears not anxious. Cognition and memory are normal. She does not exhibit a depressed mood.          Assessment & Plan:

## 2012-12-02 NOTE — Patient Instructions (Addendum)
Start nasal saline 2-3 sprays per nostril daily.  Start mucinex (plain) twice daily. Can tylenol for pain. Follow BP at home. If not improving in the next 48 hours call for consideration of antibiotics.

## 2012-12-02 NOTE — Assessment & Plan Note (Signed)
No clear bacterial infection. Treat symptomatically with nasla saline and mucinex to open sinuses. If not improving consider antibiotic course.

## 2012-12-04 ENCOUNTER — Encounter: Payer: Self-pay | Admitting: Family Medicine

## 2012-12-04 ENCOUNTER — Ambulatory Visit (INDEPENDENT_AMBULATORY_CARE_PROVIDER_SITE_OTHER): Payer: Medicare Other | Admitting: Family Medicine

## 2012-12-04 VITALS — BP 140/70 | HR 64 | Temp 99.2°F | Ht 60.0 in | Wt 141.5 lb

## 2012-12-04 DIAGNOSIS — J449 Chronic obstructive pulmonary disease, unspecified: Secondary | ICD-10-CM

## 2012-12-04 DIAGNOSIS — J0110 Acute frontal sinusitis, unspecified: Secondary | ICD-10-CM

## 2012-12-04 MED ORDER — AMOXICILLIN 500 MG PO CAPS: 1000.0000 mg | ORAL_CAPSULE | Freq: Two times a day (BID) | ORAL | Status: DC

## 2012-12-04 NOTE — Progress Notes (Signed)
Nature conservation officer at [facility] [address] [place] [address] Phone: [phone] Fax: [phone]  Date:  12/04/2012   Name:  [name]   DOB:  1932-03-09   MRN:  [id number] Gender: female Age: 79 y.o.  Primary Physician:  Hannah Beat, MD  Evaluating MD: Hannah Beat, MD   Chief Complaint: Sinusitis   History of Present Illness:  [name] is a 79 y.o. pleasant patient who presents with the following:  Complex medical patient who I know very well with an extensive coronary history, history of chronic obstructive pulmonary disease, with multiple prolonged hospitalizations within the last couple of years who presents in followup after seeing my partner earlier in the week. She was having some headaches and sinus pressure. At that point it was felt that she likely has some sinusitis, but it may not have been bacterial in that assessment.  At this point, the patient feels worse, she is having more pain behind her eyes and behind in her sinus region. She also is having some cough and production of sputum. She does have baseline chronic obstructive pulmonary disease as well. F/u illness, and pain and blocked on the right.    Patient Active Problem List   Diagnosis Date Noted  . History of non-ST elevation myocardial infarction (NSTEMI) 07/22/2011    Priority: High  . GIB (gastrointestinal bleeding) 04/25/2011    Priority: High  . CAD (coronary artery disease) 09/01/2002    Priority: High  . Smoking 02/09/2011    Priority: Medium  . AAA (abdominal aortic aneurysm) 12/22/2010    Priority: Medium  . COPD 05/31/2004    Priority: Medium  . HYPERLIPIDEMIA 07/02/1999    Priority: Medium  . DIABETES MELLITUS, TYPE II 07/31/1997    Priority: Medium  . HYPERTENSION 08/01/1995    Priority: Medium  . Rheumatoid arthritis 04/03/1983    Priority: Medium  . Acute sinusitis 12/02/2012  . UTI (urinary tract infection) 11/07/2012  . History of GI  diverticular bleed 09/25/2012  . Depression 06/17/2012  . Hx of CABG 04/18/2012  . Acute on chronic diastolic CHF (congestive heart failure) 02/05/2012  . Acute respiratory failure 02/04/2012  . HCAP (healthcare-associated pneumonia) 02/02/2012  . Splenic artery aneurysm   . DIVERTICULOSIS OF COLON 05/14/2008  . ANXIETY DEPRESSION 04/19/2008  . ECZEMA 07/21/2007  . ARTERIOVENOUS MALFORMATION, GASTRIC 11/08/2006  . INSOMNIA, CHRONIC 07/21/2006  . ANEMIA-IRON DEFICIENCY 07/01/2004  . FATTY LIVER DISEASE 07/16/2002  . OSTEOPOROSIS 12/31/2000  . OSTEOARTHRITIS 04/03/1983    Past Medical History  Diagnosis Date  . Hypertension 08/01/1995  . Rheumatoid arthritis(714.0) 04/03/1983  . Osteoarthritis 04/03/1983  . Anemia, deficiency 07/01/2004    (w/u by Dr. Lorre Nick)  . Diabetes mellitus type II 07/31/1997  . Osteoporosis 12/31/2000  . CAD (coronary artery disease) 09/01/2002    a. s/p NSTEMI 2012. b. s/p CABG 01/2012.  [name] Hyperlipemia 07/02/1999  . COPD (chronic obstructive pulmonary disease) 05/31/2004  . diverticulosis of colon   . Arteriovenous malformation of gastrointestinal tract   . Fatty liver   . Chronic back pain   . Splenic artery aneurysm   . Anxiety and depression   . GI bleed     a. 04/2011 felt diverticular requiring microembolization by vascular.  . Adrenal mass     a. Excision 2007.  [name] Restless leg syndrome   . C. difficile colitis     a. 04/2008 = severe.  [name] Hx of CABG 04/18/2012  . Diastolic CHF  a. 02/2012: resp failure with COPD exac/CHF/pleural eff.;  b.  Echocardiogram 08/29/12: EF 55-60%, mild LVH, mild LAE, impaired LV relaxation    Past Surgical History  Procedure Laterality Date  . Nasal sinus surgery    . Inguinal hernia repair    . Partial hysterectomy      ovaries intact, dysmennorhea w/ A/P repari  . Carpal tunnel release      bilat  . Trigger finger release      right  . Wrist surgery      neuroma repair, right  . Ankle surgery       neuroma repair, right ORIF  . Total shoulder replacement  12/13/1998    right, ([place])  . Rotator cuff repair  11/22/2003    ([place]) left  . Epidural block injection  05/2005    x 3  . Long finger tendon realignment  10/31/2005    [place]  . Lumbar epidural  06/13/2006  . Laminectomy  06/2006    L4/5 spinal stenosis (Dr. [name])  . Laminectomy  02/24/2008    L3/4 Ant/Lat interbody fusion and Lat Artthrodesis w/plate using XLIF (Dr. [name])  . Colonic embolization  04/18/2011    Dr. Wyn Quaker, acute GI bleed  . Spinal cord stimulator implant    . Coronary artery bypass graft  01/22/2012    Procedure: CORONARY ARTERY BYPASS GRAFTING (CABG);  Surgeon: Delight Ovens, MD;  Location: [facility] OR;  Service: Open Heart Surgery;  Laterality: N/A;  Coronary Artery Bypass Graft times four utilizing the left internal mammary artery and the left greater saphenous vein harvested endoscopically.  Rhae Hammock without cardioversion  01/22/2012    Procedure: TRANSESOPHAGEAL ECHOCARDIOGRAM (TEE);  Surgeon: Delight Ovens, MD;  Location: [facility] OR;  Service: Open Heart Surgery;  Laterality: N/A;    History   Social History  . Marital Status: Widowed    Spouse Name: N/A    Number of Children: 4  . Years of Education: N/A   Occupational History  . [employer]     9 hour days-6 days/week   Social History Main Topics  . Smoking status: Former Smoker -- 0.10 packs/day for 62 years    Types: Cigarettes    Quit date: 08/01/2012  . Smokeless tobacco: Former Neurosurgeon  . Alcohol Use: Yes     Comment: red wine occassionally  . Drug Use: No  . Sexual Activity: No   Other Topics Concern  . Not on file   Social History Narrative   Married, widow 47          Family History  Problem Relation Age of Onset  . Hypotension Mother   . Dementia Mother   . Alcohol abuse Father   . Heart disease Father     MI, enlarged heart  . Diabetes Brother     Allergies  Allergen Reactions  . Iron Diarrhea  . Morphine Sulfate  Nausea And Vomiting    At high doses hallicuniations  . Naproxen Rash    REACTION: UNSPECIFIED    Medication list has been reviewed and updated.  Outpatient Prescriptions Prior to Visit  Medication Sig Dispense Refill  . aspirin EC 81 MG tablet Take one by mouth every other day      . atorvastatin (LIPITOR) 40 MG tablet Take 40 mg by mouth daily.      . bisoprolol (ZEBETA) 5 MG tablet 1/2 tablet daily  15 tablet  5  . Docusate Calcium (STOOL SOFTENER PO) Take by mouth daily.      [name]  fish oil-omega-3 fatty acids 1000 MG capsule Take 1 g by mouth daily.      [name] lactobacillus acidophilus (BACID) TABS tablet Take 1 tablet by mouth daily. Probiotic      . Multiple Vitamin (MULTIVITAMIN WITH MINERALS) TABS Take 1 tablet by mouth daily.      . nitroGLYCERIN (NITROSTAT) 0.3 MG SL tablet Place 1 tablet (0.3 mg total) under the tongue every 5 (five) minutes as needed for chest pain. Max 3 per day  50 tablet  3  . oxyCODONE (ROXICODONE) 15 MG immediate release tablet Take 15 mg by mouth every 6 (six) hours as needed for pain.      . ramipril (ALTACE) 5 MG capsule Take 1 capsule (5 mg total) by mouth daily.  30 capsule  6   No facility-administered medications prior to visit.    Review of Systems:  ROS: GEN: Acute illness details above GI: Tolerating PO intake GU: maintaining adequate hydration and urination Interactive and getting along well at home.  Otherwise, ROS is as per the HPI.   Physical Examination: BP 140/70  Pulse 64  Temp(Src) 99.2 F (37.3 C) (Oral)  Ht 5' (1.524 m)  Wt 141 lb 8 oz (64.184 kg)  BMI 27.63 kg/m2  Ideal Body Weight: Weight in (lb) to have BMI = 25: 127.7   Gen: WDWN, NAD; alert,appropriate and cooperative throughout exam  HEENT: Normocephalic and atraumatic. Throat clear, w/o exudate, no LAD, R TM clear, L TM - good landmarks, No fluid present. rhinnorhea.  Left frontal and maxillary sinuses: Tender Right frontal and maxillary sinuses: Tender mildly in  frontal, more in the ethmoids on the R  Neck: No ant or post LAD CV: RRR, No M/G/R Pulm: Breathing comfortably in no resp distress. Scattered rhonchi with rare wheeze Abd: S,NT,ND,+BS Extr: no c/c/e Psych: full affect, pleasant   Assessment and Plan:  Sinusitis, acute frontal  COPD  Complex patient. Seems consistent with ethmoid versus frontal sinusitis to me. She also has some significant chronic obstructive pulmonary disease. Not consistent with pneumonia today, but she is having some relatively increased pulmonary activity. Albuterol as needed.  Orders Today:  No orders of the defined types were placed in this encounter.    Updated Medication List: (Includes new medications, updates to list, dose adjustments) Meds ordered this encounter  Medications  . acetaminophen (TYLENOL) 325 MG tablet    Sig: Take 650 mg by mouth every 6 (six) hours as needed for pain.  [name] guaiFENesin (MUCINEX) 600 MG 12 hr tablet    Sig: Take 600 mg by mouth 2 (two) times daily.  . sodium chloride (OCEAN) 0.65 % nasal spray    Sig: Place 1 spray into the nose at bedtime as needed for congestion.  [name] amoxicillin (AMOXIL) 500 MG capsule    Sig: Take 2 capsules (1,000 mg total) by mouth 2 (two) times daily.    Dispense:  40 capsule    Refill:  0    Medications Discontinued: There are no discontinued medications.    Signed, Elpidio Galea. Quenisha Lovins, MD 12/04/2012 6:56 PM

## 2012-12-09 ENCOUNTER — Encounter: Payer: Self-pay | Admitting: Cardiology

## 2012-12-09 ENCOUNTER — Ambulatory Visit (INDEPENDENT_AMBULATORY_CARE_PROVIDER_SITE_OTHER): Payer: Medicare Other | Admitting: Cardiology

## 2012-12-09 VITALS — BP 160/70 | HR 57 | Ht 60.0 in | Wt 139.0 lb

## 2012-12-09 DIAGNOSIS — I10 Essential (primary) hypertension: Secondary | ICD-10-CM

## 2012-12-09 DIAGNOSIS — K922 Gastrointestinal hemorrhage, unspecified: Secondary | ICD-10-CM

## 2012-12-09 DIAGNOSIS — E785 Hyperlipidemia, unspecified: Secondary | ICD-10-CM

## 2012-12-09 DIAGNOSIS — I5032 Chronic diastolic (congestive) heart failure: Secondary | ICD-10-CM

## 2012-12-09 DIAGNOSIS — J449 Chronic obstructive pulmonary disease, unspecified: Secondary | ICD-10-CM

## 2012-12-09 DIAGNOSIS — I2510 Atherosclerotic heart disease of native coronary artery without angina pectoris: Secondary | ICD-10-CM

## 2012-12-09 LAB — BASIC METABOLIC PANEL
CO2: 31 mEq/L (ref 19–32)
Chloride: 101 mEq/L (ref 96–112)
Creatinine, Ser: 0.7 mg/dL (ref 0.4–1.2)
Sodium: 138 mEq/L (ref 135–145)

## 2012-12-09 LAB — CBC WITH DIFFERENTIAL/PLATELET
Basophils Relative: 0.5 % (ref 0.0–3.0)
Eosinophils Absolute: 0.2 10*3/uL (ref 0.0–0.7)
HCT: 33.2 % — ABNORMAL LOW (ref 36.0–46.0)
Hemoglobin: 11 g/dL — ABNORMAL LOW (ref 12.0–15.0)
Lymphs Abs: 1.8 10*3/uL (ref 0.7–4.0)
MCHC: 33 g/dL (ref 30.0–36.0)
MCV: 86.6 fl (ref 78.0–100.0)
Monocytes Absolute: 0.5 10*3/uL (ref 0.1–1.0)
Neutro Abs: 5.4 10*3/uL (ref 1.4–7.7)
RBC: 3.84 Mil/uL — ABNORMAL LOW (ref 3.87–5.11)

## 2012-12-09 NOTE — Progress Notes (Signed)
Patient ID: [name], female   DOB: 1931-04-05, 79 y.o.   MRN: [id number] PCP: Dr. Patsy Lager  79 yo with history of COPD, HTN, diabetes, and CAD presents for cardiology followup.  Patient was admitted to [facility] with chest pain in 8/12.  Troponin was mildly elevated.  Left heart cath showed severe, diffuse disease in a small RCA that was not amenable to intervention.  This was the likely culprit vessel.  She also had a 70% LAD stenosis.  [name] was then done to assess for ischemia in the LAD distribution.  This showed only a fixed apical perfusion defect with no ischemia (low risk).  She was admitted to [facility] in 1/13 with a lower GI bleed that was thought to be diverticular.  She required blood transfusion.  Due to ongoing episodes of chest pain, I sent her for LHC in 10/13, and she ended up having CABG x 4.  She did well post-op and was sent to a nursing home.  She was, however, readmitted in 11/13 with acute on chronic diastolic CHF and an an acute exacerbation of COPD.  She was diuresed and treated with antibiotics.  Last echo in 5/14 showed EF 55-60%.   Most recently, she was admitted to [facility] with lower GI bleed, presumed to be from diverticulosis.  She has had issues with bleeding several times over the last year. If the bleeding recurs at this point, she may have a partial colectomy.    She has had no chest pain. She has stable exertional dyspnea with stairs or heavier housework.  Main limitation continues to be back and hip pain.  She is using oxycodone.  She is using oxygen at night.  She continues to stay off cigarettes.  BP is running high today but she just took her BP meds.   Labs (8/12): LDL 58, HDL 22 Labs (11/12): LDL 54, HDl 32, creatinine 0.8 Labs (4/13): K 3.5, creatinine 1.3 Labs (7/13): K 3.2, creatinine 1.0, LDL 73, HDL 32 Labs (11/13): K 3.9, creatinine 0.9 Labs (12/13): K 5.8, creatinine 0.77, BNP 103 Labs (1/14): K 4.4, creatinine 0.9 Labs (5/14): HCT 26, LDL  66  PMH: 1. COPD 2. HTN 3. Diabetes mellitus type II 4. ? AAA: Patient says she was told by [name] that she has an aneurysm. Abdominal US (10/12) showed no AAA.  5. Chronic low back pain 6. Osteoarthritis: hip pain 7. Anemia  8. Excision of adrenal mass in 2007 9. Restless leg syndrome. 10. Bilateral shoulder replacements.  11. CAD: Cath in 2004 with moderate nonobstructive disease.  Patient was admitted to [facility] in 8/12 with NSTEMI.  LHC (8/12) showed severe diffuse disease of a small RCA.  This was the culprit lesion but it was too small and diffusely diseased for intervention.  There was a 70% proximal LAD stenosis.  Lexiscan myoview (9/12) to assess for LAD territory ischemia showed a small fixed apical defect, low risk.  EF 65% by myoview. CABG 10/13 with LIMA-LAD, SVG-D, SVG-PLOM, and SVG-RCA.  Echo (11/13): EF 55-60%, grade I diastolic dysfunction, PA systolic pressure 38 mmHg.  12. Diverticular bleed 1/13 requiring microembolization by vascular surgery.  Recurrent diverticular bleeding in 5/14.  13. ABIs (2/13) within normal limits.  14. Chronic diastolic CHF: Last echo (5/14) with EF 55-60%, mild LVH.   SH: Quit smoking in 9/12 after NSTEMI.  Restarted to quit again in 5/14.  Lives alone in [place]. 4 children.    FH: Father with MI/CHF, died in his  40s.   ROS: All systems reviewed and negative except as per HPI.    Current Outpatient Prescriptions  Medication Sig Dispense Refill  . amoxicillin (AMOXIL) 500 MG capsule Take 2 capsules (1,000 mg total) by mouth 2 (two) times daily.  40 capsule  0  . aspirin EC 81 MG tablet Take one by mouth every other day      . atorvastatin (LIPITOR) 40 MG tablet Take 40 mg by mouth daily.      . bisoprolol (ZEBETA) 5 MG tablet 1/2 tablet daily  15 tablet  5  . Docusate Calcium (STOOL SOFTENER PO) Take by mouth daily.      . fish oil-omega-3 fatty acids 1000 MG capsule Take 1 g by mouth daily.      [name] lactobacillus acidophilus (BACID)  TABS tablet Take 1 tablet by mouth daily. Probiotic      . Multiple Vitamin (MULTIVITAMIN WITH MINERALS) TABS Take 1 tablet by mouth daily.      . nitroGLYCERIN (NITROSTAT) 0.3 MG SL tablet Place 1 tablet (0.3 mg total) under the tongue every 5 (five) minutes as needed for chest pain. Max 3 per day  50 tablet  3  . oxyCODONE (ROXICODONE) 15 MG immediate release tablet Take 15 mg by mouth every 6 (six) hours as needed for pain.      . ramipril (ALTACE) 5 MG capsule Take 1 capsule (5 mg total) by mouth daily.  30 capsule  6  . sodium chloride (OCEAN) 0.65 % nasal spray Place 1 spray into the nose at bedtime as needed for congestion.       No current facility-administered medications for this visit.    BP 160/70  Pulse 57  Ht 5' (1.524 m)  Wt 63.05 kg (139 lb)  BMI 27.15 kg/m2  SpO2 97% General: elderly, NAD Neck: No JVD, no thyromegaly or thyroid nodule.  Lungs: Prolonged expiratory phase, otherwise clear.  CV: Nondisplaced PMI.  Heart regular S1/S2, no S3/S4, no murmur.  No peripheral edema.  No carotid bruit. Unable to palpate pedal pulses.  Abdomen: Soft, nontender, no hepatosplenomegaly, no distention.  Neurologic: Alert and oriented x 3.  Psych: Normal affect. Extremities: No clubbing or cyanosis.   Assessment/Plan:  CAD  Status post CABG in 10/13 with no ischemic symptoms.  - Continue statin, bisoprolol (beta-1 selective beta blocker), ACEI.  - Continue ASA 81 mg daily.  HYPERLIPIDEMIA  Continue atorvastatin, good lipids 5/14.  HYPERTENSION  BP high today but she just took her meds.  I will have her check her BP daily after meds.  Will tolerate SBP < 150.  Smoking  Staying off cigarettes.   Diastolic CHF     She does not appear volume overloaded.  She does not need to be on Lasix.  COPD I think this is the primary culprit currently for her exertional dyspnea.  GI bleeding Suspected to be diverticular.  If she has another episode, may need partial colectomy.  Check CBC  today.  Marca Ancona 12/09/2012

## 2012-12-09 NOTE — Patient Instructions (Addendum)
Your physician recommends that you return for lab work today--BMET/CBCd   Your physician wants you to follow-up in: 6 months with Dr Shirlee Latch. (March 2015).  You will receive a reminder letter in the mail two months in advance. If you don't receive a letter, please call our office to schedule the follow-up appointment.   Call me and let me know the name of the surgeon --Luana Shu [phone]

## 2012-12-12 ENCOUNTER — Telehealth: Payer: Self-pay | Admitting: Cardiology

## 2012-12-12 DIAGNOSIS — M25559 Pain in unspecified hip: Secondary | ICD-10-CM

## 2012-12-12 NOTE — Telephone Encounter (Signed)
New message:  Pt was to call and give name of orthopedic she needs to see:  Dr.Hooten at [facility] in [place].  Please make appt and let patient know.

## 2012-12-12 NOTE — Telephone Encounter (Signed)
Referral in Epic to Dr [facility] to [facility] schedule appt and call pt.[name]

## 2012-12-17 ENCOUNTER — Ambulatory Visit: Payer: Medicare Other | Admitting: Family Medicine

## 2013-01-07 ENCOUNTER — Telehealth: Payer: Self-pay | Admitting: Cardiology

## 2013-01-07 DIAGNOSIS — I2510 Atherosclerotic heart disease of native coronary artery without angina pectoris: Secondary | ICD-10-CM

## 2013-01-07 DIAGNOSIS — I5032 Chronic diastolic (congestive) heart failure: Secondary | ICD-10-CM

## 2013-01-07 NOTE — Telephone Encounter (Signed)
New Problem:  Pt states [facility] oxygen [facility]. Pt states she received a letter from [facility] stating she needed to set up an appt between 11/07/12 and 02/05/13 in order for medicare to pay for her oxygen. Pt states she saw Dr. Shirlee Latch in September. Pt wants to know if Dr. Shirlee Latch noted she still needs oxygen and if he relayed that information to medicare and [facility]. Please advise

## 2013-01-08 NOTE — Telephone Encounter (Signed)
Per Melissa from [facility] , pt will need overnight pulse oximetry to re-certify for home O2 at night. I will forward to Dr Shirlee Latch for review and order for overnight pulse oximetry.

## 2013-01-08 NOTE — Telephone Encounter (Signed)
Pt states she has oxygen originally ordered by Dr Daleen Squibb. She only uses it at night, she does not need during the day. She needs re-certification for home O2 at night.

## 2013-01-08 NOTE — Telephone Encounter (Signed)
We can order the overnight oximetry

## 2013-01-08 NOTE — Telephone Encounter (Signed)
Pt notified will order overnight pulse oximetry.

## 2013-01-13 ENCOUNTER — Encounter: Payer: Self-pay | Admitting: Family Medicine

## 2013-01-13 ENCOUNTER — Ambulatory Visit (INDEPENDENT_AMBULATORY_CARE_PROVIDER_SITE_OTHER): Payer: Medicare Other | Admitting: Family Medicine

## 2013-01-13 VITALS — BP 132/68 | HR 68 | Temp 98.3°F | Ht 60.0 in | Wt 137.2 lb

## 2013-01-13 DIAGNOSIS — R300 Dysuria: Secondary | ICD-10-CM

## 2013-01-13 DIAGNOSIS — B9620 Unspecified Escherichia coli [E. coli] as the cause of diseases classified elsewhere: Secondary | ICD-10-CM | POA: Insufficient documentation

## 2013-01-13 DIAGNOSIS — N390 Urinary tract infection, site not specified: Secondary | ICD-10-CM

## 2013-01-13 DIAGNOSIS — J441 Chronic obstructive pulmonary disease with (acute) exacerbation: Secondary | ICD-10-CM | POA: Insufficient documentation

## 2013-01-13 LAB — POCT URINALYSIS DIPSTICK
Spec Grav, UA: 1.025
Urobilinogen, UA: 0.2

## 2013-01-13 MED ORDER — PREDNISONE 20 MG PO TABS: ORAL_TABLET | ORAL | Status: DC

## 2013-01-13 MED ORDER — DOXYCYCLINE HYCLATE 100 MG PO TABS: 100.0000 mg | ORAL_TABLET | Freq: Two times a day (BID) | ORAL | Status: DC

## 2013-01-13 NOTE — Progress Notes (Signed)
  Subjective:    Patient ID: [name], female    DOB: 1931-05-23, 79 y.o.   MRN: [id number]  HPI  79 year old female pt of Dr. Cyndie Chime with history of CHF, COPD, low back pain ( followed by Dr. [name]) presents with    1. SOB, new onset 2 week ago. Gradually worsening. Nasal congestion, clear, cough productive, clear. Fatigue, no ear pain, no sinus pain, right ear fullness. No fever. No wheeze. Compliant with spiriva, has home 02.  She is scheduled for test tonight with HH to check her oxygen levels at night to see if she needs it.    2. Urinary pressure, no dysuria. Also has urinary frequency and urgency. No abdominal pain,no flank pain.    Review of Systems  Constitutional: Negative for fever and fatigue.  HENT: Negative for ear pain.   Eyes: Negative for pain.  Respiratory: Negative for chest tightness and shortness of breath.   Cardiovascular: Negative for chest pain, palpitations and leg swelling.  Gastrointestinal: Negative for abdominal pain.  Genitourinary: Negative for dysuria.       Objective:   Physical Exam  Constitutional: Vital signs are normal. She appears well-developed and well-nourished. She is cooperative.  Non-toxic appearance. She does not appear ill. No distress.  HENT:  Head: Normocephalic.  Right Ear: Hearing, tympanic membrane, external ear and ear canal normal. Tympanic membrane is not erythematous, not retracted and not bulging.  Left Ear: Hearing, tympanic membrane, external ear and ear canal normal. Tympanic membrane is not erythematous, not retracted and not bulging.  Nose: Mucosal edema and rhinorrhea present. Right sinus exhibits no maxillary sinus tenderness and no frontal sinus tenderness. Left sinus exhibits no maxillary sinus tenderness and no frontal sinus tenderness.  Mouth/Throat: Uvula is midline, oropharynx is clear and moist and mucous membranes are normal.  Eyes: Conjunctivae, EOM and lids are normal. Pupils are equal, round,  and reactive to light. Lids are everted and swept, no foreign bodies found.  Neck: Trachea normal and normal range of motion. Neck supple. Carotid bruit is not present. No mass and no thyromegaly present.  Cardiovascular: Normal rate, regular rhythm, S1 normal, S2 normal, normal heart sounds, intact distal pulses and normal pulses.  Exam reveals no gallop and no friction rub.   No murmur heard. Pulmonary/Chest: Effort normal. Not tachypneic. No respiratory distress. She has no decreased breath sounds. She has wheezes in the right upper field, the right middle field, the right lower field, the left upper field, the left middle field and the left lower field. She has no rhonchi. She has no rales.  Abdominal: Soft. Bowel sounds are normal. There is tenderness in the left lower quadrant. There is no CVA tenderness.  Neurological: She is alert.  Skin: Skin is warm, dry and intact. No rash noted.  Psychiatric: Her speech is normal and behavior is normal. Judgment normal. Her mood appears not anxious. Cognition and memory are normal. She does not exhibit a depressed mood.          Assessment & Plan:

## 2013-01-13 NOTE — Assessment & Plan Note (Signed)
Previous pansensititve ECOLI.  Push fluids.. Cover with antibiotics.

## 2013-01-13 NOTE — Assessment & Plan Note (Signed)
No clear sign of PNA. Likely COPD exac. Start antibiotics and prednisone taper.  Close follow up given age and PMH.

## 2013-01-13 NOTE — Patient Instructions (Addendum)
Start antibiotic. Complete steroid taper.  Continue spiriva daily. Can use mucinex DM during the day for cough. Follow up in 3 days with myself.

## 2013-01-15 LAB — URINE CULTURE: Colony Count: >=100000

## 2013-01-16 ENCOUNTER — Encounter: Payer: Self-pay | Admitting: Family Medicine

## 2013-01-16 ENCOUNTER — Ambulatory Visit (INDEPENDENT_AMBULATORY_CARE_PROVIDER_SITE_OTHER)
Admission: RE | Admit: 2013-01-16 | Discharge: 2013-01-16 | Disposition: A | Discharge status: Home / Self Care | Payer: Medicare Other | Source: Ambulatory Visit | Attending: Family Medicine | Admitting: Family Medicine

## 2013-01-16 ENCOUNTER — Ambulatory Visit (INDEPENDENT_AMBULATORY_CARE_PROVIDER_SITE_OTHER): Payer: Medicare Other | Admitting: Family Medicine

## 2013-01-16 VITALS — BP 140/70 | HR 59 | Temp 98.1°F | Ht 60.0 in | Wt 135.5 lb

## 2013-01-16 DIAGNOSIS — R0602 Shortness of breath: Secondary | ICD-10-CM

## 2013-01-16 DIAGNOSIS — J441 Chronic obstructive pulmonary disease with (acute) exacerbation: Secondary | ICD-10-CM

## 2013-01-16 DIAGNOSIS — I5033 Acute on chronic diastolic (congestive) heart failure: Secondary | ICD-10-CM

## 2013-01-16 DIAGNOSIS — I509 Heart failure, unspecified: Secondary | ICD-10-CM

## 2013-01-16 DIAGNOSIS — N390 Urinary tract infection, site not specified: Secondary | ICD-10-CM

## 2013-01-16 MED ORDER — IPRATROPIUM BROMIDE 0.02 % IN SOLN
0.5000 mg | Freq: Once | RESPIRATORY_TRACT | Status: AC
  Administered 2013-01-16: 0.5 mg via RESPIRATORY_TRACT

## 2013-01-16 MED ORDER — ALBUTEROL SULFATE (2.5 MG/3ML) 0.083% IN NEBU
2.5000 mg | INHALATION_SOLUTION | Freq: Once | RESPIRATORY_TRACT | Status: AC
  Administered 2013-01-16: 2.5 mg via RESPIRATORY_TRACT

## 2013-01-16 MED ORDER — LEVOFLOXACIN 500 MG PO TABS: 750.0000 mg | ORAL_TABLET | Freq: Every day | ORAL | Status: DC

## 2013-01-16 MED ORDER — ALBUTEROL SULFATE (5 MG/ML) 0.5% IN NEBU: 2.5000 mg | INHALATION_SOLUTION | Freq: Four times a day (QID) | RESPIRATORY_TRACT | Status: DC | PRN

## 2013-01-16 NOTE — Assessment & Plan Note (Signed)
Symptoms improving on antibiotics. PAnsensitive Ecoli

## 2013-01-16 NOTE — Assessment & Plan Note (Signed)
Euvolemic.[name] No clear sign of fluid overload.

## 2013-01-16 NOTE — Progress Notes (Signed)
  Subjective:    Patient ID: [name], female    DOB: 05-23-1931, 79 y.o.   MRN: [id number]  HPI 79 year old female presents for 3 day follow up on acute exac COPD and Ecoli UTI.  Treated with doxycyline and prednisone taper. She reports her breathing is not better. She is short of breath with exertion (Able to speak in complete sentences). Coughing is improved some.  No fever. She is sleeping all day. She almost fell twice this AM because of weakness. The prednisone gave her anxiety and shakiness, she still took it.. Did not help.   She has nebulizer but has not used it.  Urinary symptoms are improving, but not resolved on doxycycline.    Review of Systems  Constitutional: Positive for fatigue. Negative for fever.  HENT: Negative for ear pain.   Eyes: Negative for pain.  Respiratory: Positive for cough, shortness of breath and wheezing. Negative for chest tightness.   Cardiovascular: Negative for chest pain, palpitations and leg swelling.  Gastrointestinal: Negative for abdominal pain.  Genitourinary: Negative for dysuria.       Objective:   Physical Exam  Constitutional: Vital signs are normal. She appears well-developed and well-nourished. She is cooperative.  Non-toxic appearance. She does not appear ill. No distress.  Elderly female in NAD, speaking in complete sentences.  HENT:  Head: Normocephalic.  Right Ear: Hearing, tympanic membrane, external ear and ear canal normal. Tympanic membrane is not erythematous, not retracted and not bulging.  Left Ear: Hearing, tympanic membrane, external ear and ear canal normal. Tympanic membrane is not erythematous, not retracted and not bulging.  Nose: Mucosal edema and rhinorrhea present. Right sinus exhibits no maxillary sinus tenderness and no frontal sinus tenderness. Left sinus exhibits no maxillary sinus tenderness and no frontal sinus tenderness.  Mouth/Throat: Uvula is midline, oropharynx is clear and moist and mucous  membranes are normal.  Eyes: Conjunctivae, EOM and lids are normal. Pupils are equal, round, and reactive to light. Lids are everted and swept, no foreign bodies found.  Neck: Trachea normal and normal range of motion. Neck supple. Carotid bruit is not present. No mass and no thyromegaly present.  Cardiovascular: Normal rate, regular rhythm, S1 normal, S2 normal, normal heart sounds, intact distal pulses and normal pulses.  Exam reveals no gallop and no friction rub.   No murmur heard. Pulmonary/Chest: Effort normal. Not tachypneic. No respiratory distress. She has no decreased breath sounds. She has wheezes in the right upper field, the right middle field, the right lower field, the left upper field, the left middle field and the left lower field. She has no rhonchi. She has no rales.  Decreased air movement throughout.. Improved with nebulizer treatment  Abdominal: Soft. Bowel sounds are normal. There is tenderness in the left lower quadrant. There is no CVA tenderness.  Neurological: She is alert.  Skin: Skin is warm, dry and intact. No rash noted.  Psychiatric: Her speech is normal and behavior is normal. Judgment normal. Her mood appears not anxious. Cognition and memory are normal. She does not exhibit a depressed mood.          Assessment & Plan:

## 2013-01-16 NOTE — Patient Instructions (Addendum)
We will call with X-ray results.    Stop doxcycline.  Change to levaquin x 10 days.  Start home nebs.  Complete prednsione taper.   Follow up early next week.  Go to ER if worsening shortness of breath.

## 2013-01-16 NOTE — Assessment & Plan Note (Signed)
Minimal improvement on steroid ( cannot tolerate).  CXR in office showed chronic change, ? Infiltrate in left lung... Send for radiologist review.  [place] antibiotics to levaquin x 10 days.

## 2013-01-20 ENCOUNTER — Encounter: Payer: Self-pay | Admitting: Family Medicine

## 2013-01-20 ENCOUNTER — Ambulatory Visit (INDEPENDENT_AMBULATORY_CARE_PROVIDER_SITE_OTHER): Payer: Medicare Other | Admitting: Family Medicine

## 2013-01-20 VITALS — BP 130/80 | HR 77 | Temp 97.5°F | Ht 60.0 in | Wt 136.2 lb

## 2013-01-20 DIAGNOSIS — R0602 Shortness of breath: Secondary | ICD-10-CM

## 2013-01-20 DIAGNOSIS — J441 Chronic obstructive pulmonary disease with (acute) exacerbation: Secondary | ICD-10-CM

## 2013-01-20 DIAGNOSIS — B9620 Unspecified Escherichia coli [E. coli] as the cause of diseases classified elsewhere: Secondary | ICD-10-CM

## 2013-01-20 DIAGNOSIS — A498 Other bacterial infections of unspecified site: Secondary | ICD-10-CM

## 2013-01-20 DIAGNOSIS — N390 Urinary tract infection, site not specified: Secondary | ICD-10-CM

## 2013-01-20 LAB — POCT URINALYSIS DIPSTICK
Blood, UA: NEGATIVE
Ketones, UA: NEGATIVE
Spec Grav, UA: 1.025
Urobilinogen, UA: 0.2
pH, UA: 6

## 2013-01-20 MED ORDER — LEVOFLOXACIN 500 MG PO TABS: 500.0000 mg | ORAL_TABLET | Freq: Every day | ORAL | Status: DC

## 2013-01-20 NOTE — Assessment & Plan Note (Signed)
No improvement with  prednisone taper. CXR stable. Complete 10 days of levaquin. Use albuterol regularly.  Recommended adding advair. Pt not interested. Recommended referral to pulm for baseline COPD and SOB. Pt refused.

## 2013-01-20 NOTE — Assessment & Plan Note (Addendum)
UA today: clear.  resolved infection.

## 2013-01-20 NOTE — Patient Instructions (Addendum)
Call if not back to baseline shortness of breath in 5 days at end of antibiotic course. Use albuterol nebulizer every 4-6 hours as needed. Go to ER if severe shortness of breath. Notify pt urine sample shows resolved infection.

## 2013-01-20 NOTE — Progress Notes (Signed)
  Subjective:    Patient ID: [name], female    DOB: 05/21/1931, 79 y.o.   MRN: [id number]  HPI 79 year old female presents for follow up on shortness of breath thought to be from COPD exacerbation... Did not respond to doxycycline and steroid taper...now on levaquin x  4 days. Told to use albuterol nebs prn (using 1-2 times  day), she does not think this helps much. Continuing spiriva daily.  She reports she is still feeling poorly.  Fatigue overall.  She remains short of breath. Talking in complete sentences. She has been feeling bad for a total of almost 2 Her[place] now. She is having productive cough,thick white mucus. No fever. No new swelling in ankles.  She does not have a pulmonologist.    Ivan Croft UTI: improving with antibiotics, but still just a small amount of dysuria intermittent No abdominal pain..   Review of Systems  Constitutional: Positive for fatigue. Negative for fever.  HENT: Negative for ear pain.   Eyes: Negative for pain.  Respiratory: Positive for cough and shortness of breath. Negative for chest tightness.   Cardiovascular: Negative for chest pain, palpitations and leg swelling.  Gastrointestinal: Negative for abdominal pain.  Genitourinary: Negative for dysuria.       Objective:   Physical Exam  Constitutional: Vital signs are normal. She appears well-developed and well-nourished. She is cooperative.  Non-toxic appearance. She does not appear ill. No distress.  Elderly female in NAD, speaking in complete sentences.  HENT:  Head: Normocephalic.  Right Ear: Hearing, tympanic membrane, external ear and ear canal normal. Tympanic membrane is not erythematous, not retracted and not bulging.  Left Ear: Hearing, tympanic membrane, external ear and ear canal normal. Tympanic membrane is not erythematous, not retracted and not bulging.  Nose: Mucosal edema and rhinorrhea present. Right sinus exhibits no maxillary sinus tenderness and no frontal sinus  tenderness. Left sinus exhibits no maxillary sinus tenderness and no frontal sinus tenderness.  Mouth/Throat: Uvula is midline, oropharynx is clear and moist and mucous membranes are normal.  Eyes: Conjunctivae, EOM and lids are normal. Pupils are equal, round, and reactive to light. Lids are everted and swept, no foreign bodies found.  Neck: Trachea normal and normal range of motion. Neck supple. Carotid bruit is not present. No mass and no thyromegaly present.  Cardiovascular: Normal rate, regular rhythm, S1 normal, S2 normal, normal heart sounds, intact distal pulses and normal pulses.  Exam reveals no gallop and no friction rub.   No murmur heard. Pulmonary/Chest: Effort normal. Not tachypneic. No respiratory distress. She has no decreased breath sounds. She has wheezes in the right upper field, the right middle field, the right lower field, the left upper field, the left middle field and the left lower field. She has no rhonchi. She has no rales.  Decreased air movement throughout.  Abdominal: Soft. Bowel sounds are normal. There is tenderness in the left lower quadrant. There is no CVA tenderness.  Neurological: She is alert.  Skin: Skin is warm, dry and intact. No rash noted.  Psychiatric: Her speech is normal and behavior is normal. Judgment normal. Her mood appears not anxious. Cognition and memory are normal. She does not exhibit a depressed mood.   Subjective:

## 2013-01-21 ENCOUNTER — Other Ambulatory Visit: Payer: Self-pay | Admitting: *Deleted

## 2013-01-21 DIAGNOSIS — R0600 Dyspnea, unspecified: Secondary | ICD-10-CM

## 2013-01-21 DIAGNOSIS — I5032 Chronic diastolic (congestive) heart failure: Secondary | ICD-10-CM

## 2013-01-21 DIAGNOSIS — I2510 Atherosclerotic heart disease of native coronary artery without angina pectoris: Secondary | ICD-10-CM

## 2013-02-05 ENCOUNTER — Encounter: Payer: Self-pay | Admitting: Cardiology

## 2013-02-10 ENCOUNTER — Other Ambulatory Visit: Payer: Self-pay | Admitting: Cardiology

## 2013-02-16 ENCOUNTER — Ambulatory Visit: Payer: Medicare Other | Admitting: Cardiology

## 2013-04-23 ENCOUNTER — Telehealth: Payer: Self-pay

## 2013-04-23 NOTE — Telephone Encounter (Signed)
Pt left v/m requesting antibiotic sent to [facility] prior to dental work being done; pt has had shoulder replacement.pt request cb.

## 2013-04-28 MED ORDER — AMOXICILLIN 500 MG PO CAPS: 2000.0000 mg | ORAL_CAPSULE | Freq: Once | ORAL | Status: DC

## 2013-04-28 NOTE — Telephone Encounter (Signed)
done

## 2013-05-18 ENCOUNTER — Encounter: Payer: Self-pay | Admitting: Cardiology

## 2013-05-26 ENCOUNTER — Other Ambulatory Visit: Payer: Self-pay | Admitting: Cardiology

## 2013-06-01 ENCOUNTER — Telehealth: Payer: Self-pay | Admitting: Family Medicine

## 2013-06-01 ENCOUNTER — Ambulatory Visit (INDEPENDENT_AMBULATORY_CARE_PROVIDER_SITE_OTHER): Payer: Medicare Other | Admitting: Family Medicine

## 2013-06-01 ENCOUNTER — Encounter: Payer: Self-pay | Admitting: Family Medicine

## 2013-06-01 VITALS — BP 128/80 | HR 80 | Temp 98.1°F | Ht 60.0 in | Wt 138.8 lb

## 2013-06-01 DIAGNOSIS — N390 Urinary tract infection, site not specified: Secondary | ICD-10-CM

## 2013-06-01 DIAGNOSIS — R300 Dysuria: Secondary | ICD-10-CM

## 2013-06-01 LAB — POCT URINALYSIS DIPSTICK
BILIRUBIN UA: NEGATIVE
GLUCOSE UA: NEGATIVE
NITRITE UA: NEGATIVE
PH UA: 6
Spec Grav, UA: 1.025
Urobilinogen, UA: 0.2

## 2013-06-01 MED ORDER — CEPHALEXIN 500 MG PO CAPS: 500.0000 mg | ORAL_CAPSULE | Freq: Two times a day (BID) | ORAL | Status: DC

## 2013-06-01 NOTE — Telephone Encounter (Signed)
Please let pt know that I rec her urine cx report from [facility] I want her to stop her current abx (macrodantin)  I sent keflex to her pharmacy  We will call when today's cx returns and I hope she feels better soon

## 2013-06-01 NOTE — Patient Instructions (Signed)
Please call for urine culture from urgent care- asap please (I need to change pt antibiotic)  Drink lots of water and we will call you with a plan (what antibiotic to change you to )  We are also getting another urine culture - and we will call you with that result

## 2013-06-01 NOTE — Telephone Encounter (Signed)
Patient advised.

## 2013-06-01 NOTE — Progress Notes (Signed)
Subjective:    Patient ID: [name], female    DOB: [birth year], 80 y.o.   MRN: [id number]  HPI Here for f/u of uti   Was seen in UC on Friday  Dx with uti  Given one abx and then called to say the cx returned and had to change it to microfurantion (that was Sat)  Has pyridium - last dose last night  Still burns to urinate / lots of pressure  Frequent urination - not as bad  More incontinence   Felt like she had a low grade fever- takes tylenol No n/v   This is 3rd uti this year     Results for orders placed in visit on 06/01/13  POCT URINALYSIS DIPSTICK      Result Value Ref Range   Color, UA amber     Clarity, UA cloudy     Glucose, UA neg.     Bilirubin, UA neg.     Ketones, UA trace     Spec Grav, UA 1.025     Blood, UA large     pH, UA 6.0     Protein, UA 100++     Urobilinogen, UA 0.2     Nitrite, UA neg.     Leukocytes, UA moderate (2+)       Patient Active Problem List   Diagnosis Date Noted  . UTI (urinary tract infection) 06/01/2013  . Shortness of breath 01/16/2013  . COPD exacerbation 01/13/2013  . E-coli UTI 01/13/2013  . History of GI diverticular bleed 09/25/2012  . Depression 06/17/2012  . Hx of CABG 04/18/2012  . Acute on chronic diastolic CHF (congestive heart failure) 02/05/2012  . Acute respiratory failure 02/04/2012  . HCAP (healthcare-associated pneumonia) 02/02/2012  . Splenic artery aneurysm   . History of non-ST elevation myocardial infarction (NSTEMI) 07/22/2011  . GIB (gastrointestinal bleeding) 04/25/2011  . Smoking 02/09/2011  . AAA (abdominal aortic aneurysm) 12/22/2010  . DIVERTICULOSIS OF COLON 05/14/2008  . ANXIETY DEPRESSION 04/19/2008  . ECZEMA 07/21/2007  . ARTERIOVENOUS MALFORMATION, GASTRIC 11/08/2006  . INSOMNIA, CHRONIC 07/21/2006  . ANEMIA-IRON DEFICIENCY 07/01/2004  . COPD 05/31/2004  . CAD (coronary artery disease) 09/01/2002  . FATTY LIVER DISEASE 07/16/2002  . OSTEOPOROSIS 12/31/2000  .  HYPERLIPIDEMIA 07/02/1999  . DIABETES MELLITUS, TYPE II 07/31/1997  . HYPERTENSION 08/01/1995  . Rheumatoid arthritis 04/03/1983  . OSTEOARTHRITIS 04/03/1983   Past Medical History  Diagnosis Date  . Hypertension 08/01/1995  . Rheumatoid arthritis(714.0) 04/03/1983  . Osteoarthritis 04/03/1983  . Anemia, deficiency 07/01/2004    (w/u by Dr. Lorre Nick)  . Diabetes mellitus type II 07/31/1997  . Osteoporosis 12/31/2000  . CAD (coronary artery disease) 09/01/2002    a. s/p NSTEMI 2012. b. s/p CABG 01/2012.  [name] Hyperlipemia 07/02/1999  . COPD (chronic obstructive pulmonary disease) 05/31/2004  . diverticulosis of colon   . Arteriovenous malformation of gastrointestinal tract   . Fatty liver   . Chronic back pain   . Splenic artery aneurysm   . Anxiety and depression   . GI bleed     a. 04/2011 felt diverticular requiring microembolization by vascular.  . Adrenal mass     a. Excision 2007.  [name] Restless leg syndrome   . C. difficile colitis     a. 04/2008 = severe.  [name] Hx of CABG 04/18/2012  . Diastolic CHF     a. 02/2012: resp failure with COPD exac/CHF/pleural eff.;  b.  Echocardiogram 08/29/12: EF 55-60%, mild LVH, mild LAE,  impaired LV relaxation   Past Surgical History  Procedure Laterality Date  . Nasal sinus surgery    . Inguinal hernia repair    . Partial hysterectomy      ovaries intact, dysmennorhea w/ A/P repari  . Carpal tunnel release      bilat  . Trigger finger release      right  . Wrist surgery      neuroma repair, right  . Ankle surgery      neuroma repair, right ORIF  . Total shoulder replacement  12/13/1998    right, ([place])  . Rotator cuff repair  11/22/2003    ([place]) left  . Epidural block injection  05/2005    x 3  . Long finger tendon realignment  10/31/2005    [place]  . Lumbar epidural  06/13/2006  . Laminectomy  06/2006    L4/5 spinal stenosis (Dr. [name])  . Laminectomy  02/24/2008    L3/4 Ant/Lat interbody fusion and Lat Artthrodesis  w/plate using XLIF (Dr. [name])  . Colonic embolization  04/18/2011    Dr. Wyn Quaker, acute GI bleed  . Spinal cord stimulator implant    . Coronary artery bypass graft  01/22/2012    Procedure: CORONARY ARTERY BYPASS GRAFTING (CABG);  Surgeon: Delight Ovens, MD;  Location: [facility] OR;  Service: Open Heart Surgery;  Laterality: N/A;  Coronary Artery Bypass Graft times four utilizing the left internal mammary artery and the left greater saphenous vein harvested endoscopically.  Rhae Hammock without cardioversion  01/22/2012    Procedure: TRANSESOPHAGEAL ECHOCARDIOGRAM (TEE);  Surgeon: Delight Ovens, MD;  Location: [facility] OR;  Service: Open Heart Surgery;  Laterality: N/A;   History  Substance Use Topics  . Smoking status: Former Smoker -- 0.10 packs/day for 62 years    Types: Cigarettes    Quit date: 08/01/2012  . Smokeless tobacco: Former Neurosurgeon  . Alcohol Use: Yes     Comment: red wine occassionally   Family History  Problem Relation Age of Onset  . Hypotension Mother   . Dementia Mother   . Alcohol abuse Father   . Heart disease Father     MI, enlarged heart  . Diabetes Brother    Allergies  Allergen Reactions  . Iron Diarrhea  . Morphine Sulfate Nausea And Vomiting    At high doses hallucinations Can take in low doses  . Naproxen Rash    REACTION: UNSPECIFIED   Current Outpatient Prescriptions on File Prior to Visit  Medication Sig Dispense Refill  . albuterol (PROVENTIL) (5 MG/ML) 0.5% nebulizer solution Take 0.5 mLs (2.5 mg total) by nebulization every 6 (six) hours as needed for wheezing.  20 mL  12  . amoxicillin (AMOXIL) 500 MG capsule Take 4 capsules (2,000 mg total) by mouth once. Prior to procedure  4 capsule  0  . aspirin EC 81 MG tablet Take one by mouth every other day      . atorvastatin (LIPITOR) 40 MG tablet Take 40 mg by mouth daily.      [name] atorvastatin (LIPITOR) 40 MG tablet TAKE ONE TABLET BY MOUTH EVERY DAY  30 tablet  3  . bisoprolol (ZEBETA) 5 MG tablet 1/2 tablet daily   15 tablet  5  . Docusate Calcium (STOOL SOFTENER PO) Take by mouth daily.      . fish oil-omega-3 fatty acids 1000 MG capsule Take 1 g by mouth daily.      . fluticasone (FLONASE) 50 MCG/ACT nasal spray Place 2 sprays  into the nose daily.      [name] guaiFENesin (MUCINEX) 600 MG 12 hr tablet Take 600 mg by mouth daily.      [name] lactobacillus acidophilus (BACID) TABS tablet Take 1 tablet by mouth daily. Probiotic      . Multiple Vitamin (MULTIVITAMIN WITH MINERALS) TABS Take 1 tablet by mouth daily.      . nitroGLYCERIN (NITROSTAT) 0.3 MG SL tablet Place 1 tablet (0.3 mg total) under the tongue every 5 (five) minutes as needed for chest pain. Max 3 per day  50 tablet  3  . oxyCODONE (ROXICODONE) 15 MG immediate release tablet Take 15 mg by mouth every 6 (six) hours as needed for pain.      . ramipril (ALTACE) 5 MG capsule TAKE ONE CAPSULE BY MOUTH EVERY DAY  30 capsule  0  . sodium chloride (OCEAN) 0.65 % nasal spray Place 1 spray into the nose at bedtime as needed for congestion.      [name] tiotropium (SPIRIVA) 18 MCG inhalation capsule Place 18 mcg into inhaler and inhale daily.       No current facility-administered medications on file prior to visit.     Review of Systems Review of Systems  Constitutional: Negative for fever, appetite change,  and unexpected weight change. pos for fatigue and malaise  Eyes: Negative for pain and visual disturbance.  Respiratory: Negative for cough and shortness of breath.   Cardiovascular: Negative for cp or palpitations    Gastrointestinal: Negative for nausea, diarrhea and constipation.  Genitourinary: pos for urgency and frequency. pos for dysuria /neg for flank pain or hematuria  Skin: Negative for pallor or rash   Neurological: Negative for weakness, light-headedness, numbness and headaches.  Hematological: Negative for adenopathy. Does not bruise/bleed easily.  Psychiatric/Behavioral: Negative for dysphoric mood. The patient is not nervous/anxious.          Objective:   Physical Exam  Constitutional: She appears well-developed and well-nourished. No distress.  HENT:  Head: Normocephalic and atraumatic.  Eyes: Conjunctivae and EOM are normal. Pupils are equal, round, and reactive to light.  Cardiovascular: Normal rate and regular rhythm.   Pulmonary/Chest: Effort normal and breath sounds normal.  Abdominal: Soft. Bowel sounds are normal. She exhibits no distension and no mass. There is tenderness in the suprapubic area. There is no rigidity, no rebound, no guarding and no CVA tenderness.  Neurological: She is alert.  Skin: Skin is warm and dry. No rash noted.  Psychiatric: She has a normal mood and affect.          Assessment & Plan:

## 2013-06-01 NOTE — Progress Notes (Signed)
Pre visit review using our clinic review tool, if applicable. No additional management support is needed unless otherwise documented below in the visit note. 

## 2013-06-01 NOTE — Assessment & Plan Note (Signed)
Not resp to current abx from urgent care (macrodantin) ? Also resistant  Sent for uc note/ cx - then will decide which abx to start cx today Fluids Update if not starting to improve in a week or if worsening

## 2013-06-02 LAB — URINE CULTURE
Colony Count: NO GROWTH
ORGANISM ID, BACTERIA: NO GROWTH

## 2013-06-10 ENCOUNTER — Telehealth: Payer: Self-pay | Admitting: Cardiology

## 2013-06-10 NOTE — Telephone Encounter (Signed)
Pt called because for the last week pt has been having right lower extremity swelling and some burning sensation. Pt has some swelling in left LE, but not as bad as the right. Pt states the swelling does not improved at night when she is lying down Pt denies SOB, she is not taking any diuretics. An appointment was made with Lawson Fiscal gerhardt NP ON 06/26/13. Pt is aware to call the office sooner if symptoms get worse. Pt verbalized understanding.

## 2013-06-10 NOTE — Telephone Encounter (Signed)
Pt's appointment was rescheduled for tomorrow 06/11/13 at 12:15 PM with Jacolyn Reedy PA. Pt aware.

## 2013-06-10 NOTE — Telephone Encounter (Signed)
New Message:  Pt is c/o swelling and tightness in her right leg and foot.

## 2013-06-11 ENCOUNTER — Encounter: Payer: Self-pay | Admitting: Physician Assistant

## 2013-06-11 ENCOUNTER — Telehealth: Payer: Self-pay | Admitting: *Deleted

## 2013-06-11 ENCOUNTER — Ambulatory Visit (INDEPENDENT_AMBULATORY_CARE_PROVIDER_SITE_OTHER): Payer: Medicare Other | Admitting: Physician Assistant

## 2013-06-11 VITALS — BP 170/65 | HR 64 | Ht 60.0 in | Wt 138.8 lb

## 2013-06-11 DIAGNOSIS — Z8719 Personal history of other diseases of the digestive system: Secondary | ICD-10-CM

## 2013-06-11 DIAGNOSIS — R0602 Shortness of breath: Secondary | ICD-10-CM

## 2013-06-11 DIAGNOSIS — I10 Essential (primary) hypertension: Secondary | ICD-10-CM

## 2013-06-11 DIAGNOSIS — I5033 Acute on chronic diastolic (congestive) heart failure: Secondary | ICD-10-CM

## 2013-06-11 DIAGNOSIS — I509 Heart failure, unspecified: Secondary | ICD-10-CM

## 2013-06-11 LAB — CBC WITH DIFFERENTIAL/PLATELET
Basophils Absolute: 0 10*3/uL (ref 0.0–0.1)
Basophils Relative: 0.4 % (ref 0.0–3.0)
EOS ABS: 0.1 10*3/uL (ref 0.0–0.7)
Eosinophils Relative: 1.5 % (ref 0.0–5.0)
HCT: 33.8 % — ABNORMAL LOW (ref 36.0–46.0)
HEMOGLOBIN: 11.3 g/dL — AB (ref 12.0–15.0)
LYMPHS PCT: 16.7 % (ref 12.0–46.0)
Lymphs Abs: 1.5 10*3/uL (ref 0.7–4.0)
MCHC: 33.3 g/dL (ref 30.0–36.0)
MCV: 92.6 fl (ref 78.0–100.0)
Monocytes Absolute: 0.5 10*3/uL (ref 0.1–1.0)
Monocytes Relative: 5.1 % (ref 3.0–12.0)
NEUTROS ABS: 7 10*3/uL (ref 1.4–7.7)
Neutrophils Relative %: 76.3 % (ref 43.0–77.0)
Platelets: 189 10*3/uL (ref 150.0–400.0)
RBC: 3.66 Mil/uL — AB (ref 3.87–5.11)
RDW: 13.5 % (ref 11.5–14.6)
WBC: 9.1 10*3/uL (ref 4.5–10.5)

## 2013-06-11 LAB — BASIC METABOLIC PANEL
BUN: 16 mg/dL (ref 6–23)
CO2: 32 meq/L (ref 19–32)
Calcium: 9.3 mg/dL (ref 8.4–10.5)
Chloride: 103 mEq/L (ref 96–112)
Creatinine, Ser: 0.8 mg/dL (ref 0.4–1.2)
GFR: 75.61 mL/min (ref >60.00)
Glucose, Bld: 93 mg/dL (ref 70–99)
POTASSIUM: 4.5 meq/L (ref 3.5–5.1)
SODIUM: 142 meq/L (ref 135–145)

## 2013-06-11 LAB — BRAIN NATRIURETIC PEPTIDE: Pro B Natriuretic peptide (BNP): 338 pg/mL — ABNORMAL HIGH (ref 0.0–100.0)

## 2013-06-11 MED ORDER — BISOPROLOL FUMARATE 5 MG PO TABS: ORAL_TABLET | ORAL | Status: DC

## 2013-06-11 MED ORDER — HYDROCHLOROTHIAZIDE 25 MG PO TABS: 25.0000 mg | ORAL_TABLET | Freq: Every day | ORAL | Status: DC

## 2013-06-11 MED ORDER — RAMIPRIL 5 MG PO CAPS: 5.0000 mg | ORAL_CAPSULE | Freq: Every day | ORAL | Status: DC

## 2013-06-11 NOTE — Assessment & Plan Note (Signed)
Blood pressure is elevated today. Recommend 2 g sodium diet. Increase bisoprolol to 5 mg daily. Add HCTZ 25 mg once daily. Followup blood pressure check in 2 [place].

## 2013-06-11 NOTE — Patient Instructions (Addendum)
INCREASE BISOPROLOL TO 5 G DAILY  START HCTZ 25 MG 1 TABLET DAILY; RX SENT IN   KEEP YOUR FOLLOW UP WITH DR. Shirlee Latch IN MAY 2015  NURSE VISIT TO BE IN 2 [place] FOR A BLOOD PRESSURE CHECK  LAB WORK TODAY; BMET, BNP, CBC W/DIFF   2 Gram Low Sodium Diet A 2 gram sodium diet restricts the amount of sodium in the diet to no more than 2 g or 2000 mg daily. Limiting the amount of sodium is often used to help lower blood pressure. It is important if you have heart, liver, or kidney problems. Many foods contain sodium for flavor and sometimes as a preservative. When the amount of sodium in a diet needs to be low, it is important to know what to look for when choosing foods and drinks. The following includes some information and guidelines to help make it easier for you to adapt to a low sodium diet. QUICK TIPS  Do not add salt to food.  Avoid convenience items and fast food.  Choose unsalted snack foods.  Buy lower sodium products, often labeled as "lower sodium" or "no salt added."  Check food labels to learn how much sodium is in 1 serving.  When eating at a restaurant, ask that your food be prepared with less salt or none, if possible. READING FOOD LABELS FOR SODIUM INFORMATION The nutrition facts label is a good place to find how much sodium is in foods. Look for products with no more than 500 to 600 mg of sodium per meal and no more than 150 mg per serving. Remember that 2 g = 2000 mg. The food label may also list foods as:  Sodium-free: Less than 5 mg in a serving.  Very low sodium: 35 mg or less in a serving.  Low-sodium: 140 mg or less in a serving.  Light in sodium: 50% less sodium in a serving. For example, if a food that usually has 300 mg of sodium is changed to become light in sodium, it will have 150 mg of sodium.  Reduced sodium: 25% less sodium in a serving. For example, if a food that usually has 400 mg of sodium is changed to reduced sodium, it will have 300 mg of  sodium. CHOOSING FOODS Grains  Avoid: Salted crackers and snack items. Some cereals, including instant hot cereals. Bread stuffing and biscuit mixes. Seasoned rice or pasta mixes.  Choose: Unsalted snack items. Low-sodium cereals, oats, puffed wheat and rice, shredded wheat. English muffins and bread. Pasta. Meats  Avoid: Salted, canned, smoked, spiced, pickled meats, including fish and poultry. Bacon, ham, sausage, cold cuts, hot dogs, anchovies.  Choose: Low-sodium canned tuna and salmon. Fresh or frozen meat, poultry, and fish. Dairy  Avoid: Processed cheese and spreads. Cottage cheese. Buttermilk and condensed milk. Regular cheese.  Choose: Milk. Low-sodium cottage cheese. Yogurt. Sour cream. Low-sodium cheese. Fruits and Vegetables  Avoid: Regular canned vegetables. Regular canned tomato sauce and paste. Frozen vegetables in sauces. Olives. Rosita Fire. Relishes. Sauerkraut.  Choose: Low-sodium canned vegetables. Low-sodium tomato sauce and paste. Frozen or fresh vegetables. Fresh and frozen fruit. Condiments  Avoid: Canned and packaged gravies. Worcestershire sauce. Tartar sauce. Barbecue sauce. Soy sauce. Steak sauce. Ketchup. Onion, garlic, and table salt. Meat flavorings and tenderizers.  Choose: Fresh and dried herbs and spices. Low-sodium varieties of mustard and ketchup. Lemon juice. Tabasco sauce. Horseradish. SAMPLE 2 GRAM SODIUM MEAL PLAN Breakfast / Sodium (mg)  1 cup low-fat milk / 143 mg  2  slices whole-wheat toast / 270 mg  1 tbs heart-healthy margarine / 153 mg  1 hard-boiled egg / 139 mg  1 small orange / 0 mg Lunch / Sodium (mg)  1 cup raw carrots / 76 mg   cup hummus / 298 mg  1 cup low-fat milk / 143 mg   cup red grapes / 2 mg  1 whole-wheat pita bread / 356 mg Dinner / Sodium (mg)  1 cup whole-wheat pasta / 2 mg  1 cup low-sodium tomato sauce / 73 mg  3 oz lean ground beef / 57 mg  1 small side salad (1 cup raw spinach leaves,  cup  cucumber,  cup yellow bell pepper) with 1 tsp olive oil and 1 tsp red wine vinegar / 25 mg Snack / Sodium (mg)  1 container low-fat vanilla yogurt / 107 mg  3 graham cracker squares / 127 mg Nutrient Analysis  Calories: 2033  Protein: 77 g  Carbohydrate: 282 g  Fat: 72 g  Sodium: 1971 mg Document Released: 03/19/2005 Document Revised: 06/11/2011 Document Reviewed: 06/20/2009 Exit[employer] Patient Information 2014 [place], [place].

## 2013-06-11 NOTE — Telephone Encounter (Signed)
pt notified about lab results with verbal understanding  

## 2013-06-11 NOTE — Assessment & Plan Note (Signed)
Patient has only a mild degree of edema in her ankles. Her blood pressure is elevated. I will increase her bisoprolol to 5 mg once daily. Add HCTZ 25 mg once daily. 2 g sodium diet. We will check BMET, BNP, and CBC. Followup with Dr. Sherlie Ban in May as scheduled

## 2013-06-11 NOTE — Assessment & Plan Note (Signed)
Stable without chest pain 

## 2013-06-11 NOTE — Progress Notes (Signed)
HPI:  This is a very pleasant 80 year old female patient of Dr. Shirlee LatchMcLean who has coronary artery disease status post CABG x4 in 10/13. She also has trouble with chronic diastolic heart failure COPD and most recently recurrent UTIs. Her last echo on 5/14 ejection fraction was 55-60%. She also has trouble with recurrent lower GI bleeds.  The patient comes in today complaining of edema in her right leg for the past 3-4 days. She also had some burning sensation down her right leg but this has resolved. She has chronic shortness of breath and coughing which improves with Mucinex. She admits to equal right a bit of salt and had [place] bacon for breakfast. She also said she went on a cigarette binge recently when she got upset about something. She denies any chest pain, palpitations, dizziness, or presyncope. Her shortness of breath is no worse than usual. Her blood pressure is elevated today.   Allergies  -- Iron -- Diarrhea  -- Morphine Sulfate -- Nausea And Vomiting   --  At high doses hallucinations            Can take in low doses  -- Naproxen -- Rash   --  REACTION: UNSPECIFIED  Current Outpatient Prescriptions on File Prior to Visit: albuterol (PROVENTIL) (5 MG/ML) 0.5% nebulizer solution, Take 0.5 mLs (2.5 mg total) by nebulization every 6 (six) hours as needed for wheezing., Disp: 20 mL, Rfl: 12 atorvastatin (LIPITOR) 40 MG tablet, TAKE ONE TABLET BY MOUTH EVERY DAY, Disp: 30 tablet, Rfl: 3 bisoprolol (ZEBETA) 5 MG tablet, 1/2 tablet daily, Disp: 15 tablet, Rfl: 5 fish oil-omega-3 fatty acids 1000 MG capsule, Take 1 g by mouth daily., Disp: , Rfl:  lactobacillus acidophilus (BACID) TABS tablet, Take 1 tablet by mouth daily. Probiotic, Disp: , Rfl:  morphine (KADIAN) 30 MG 24 hr capsule, Take 30 mg by mouth every 12 (twelve) hours., Disp: , Rfl:  nitroGLYCERIN (NITROSTAT) 0.3 MG SL tablet, Place 1 tablet (0.3 mg total) under the tongue every 5 (five) minutes as needed for chest pain. Max 3 per  day, Disp: 50 tablet, Rfl: 3 oxyCODONE (ROXICODONE) 15 MG immediate release tablet, Take 15 mg by mouth every 4 (four) hours as needed for pain. , Disp: , Rfl:  ramipril (ALTACE) 5 MG capsule, TAKE ONE CAPSULE BY MOUTH EVERY DAY, Disp: 30 capsule, Rfl: 0  No current facility-administered medications on file prior to visit.   Past Medical History:   Hypertension                                    08/01/1995   Rheumatoid arthritis(714.0)                     04/03/1983   Osteoarthritis                                  04/03/1983   Anemia, deficiency                              07/01/2004     Comment:(w/u by Dr. Lorre NickGittin)   Diabetes mellitus type II                       07/31/1997   Osteoporosis  12/31/2000   CAD (coronary artery disease)                   09/01/2002     Comment:a. s/p NSTEMI 2012. b. s/p CABG 01/2012.   Hyperlipemia                                    07/02/1999   COPD (chronic obstructive pulmonary disease)    05/31/2004   diverticulosis of colon                                      Arteriovenous malformation of gastrointestinal*              Fatty liver                                                  Chronic back pain                                            Splenic artery aneurysm                                      Anxiety and depression                                       GI bleed                                                       Comment:a. 04/2011 felt diverticular requiring               microembolization by vascular.   Adrenal mass                                                   Comment:a. Excision 2007.   Restless leg syndrome                                        C. difficile colitis                                           Comment:a. 04/2008 = severe.   Hx of CABG                                      04/18/2012    Diastolic  CHF                                                  Comment:a. 02/2012: resp failure with  COPD               exac/CHF/pleural eff.;  b.  Echocardiogram               08/29/12: EF 55-60%, mild LVH, mild LAE,               impaired LV relaxation  Past Surgical History:   NASAL SINUS SURGERY                                           INGUINAL HERNIA REPAIR                                        PARTIAL HYSTERECTOMY                                            Comment:ovaries intact, dysmennorhea w/ A/P repari   CARPAL TUNNEL RELEASE                                           Comment:bilat   TRIGGER FINGER RELEASE                                          Comment:right   WRIST SURGERY                                                   Comment:neuroma repair, right   ANKLE SURGERY                                                   Comment:neuroma repair, right ORIF   TOTAL SHOULDER REPLACEMENT                       12/13/1998     Comment:right, ([facility])   ROTATOR CUFF REPAIR                              11/22/2003     Comment:([facility]) left   EPIDURAL BLOCK INJECTION                         05/2005        Comment:x 3   long finger tendon realignment  10/31/2005     Comment:Meyerdierks   lumbar epidural                                  06/13/2006   LAMINECTOMY                                      06/2006        Comment:L4/5 spinal stenosis (Dr. [name])   LAMINECTOMY                                      02/24/2008     Comment:L3/4 Ant/Lat interbody fusion and Lat               Artthrodesis w/plate using XLIF (Dr. [name])   COLONIC EMBOLIZATION                             04/18/2011      Comment:Dr. Wyn Quaker, acute GI bleed   SPINAL CORD STIMULATOR IMPLANT                                CORONARY ARTERY BYPASS GRAFT                     01/22/2012     Comment:Procedure: CORONARY ARTERY BYPASS GRAFTING               (CABG);  Surgeon: Delight Ovens, MD;                Location: [facility] OR;  Service: Open Heart Surgery;               Laterality: N/A;  Coronary Artery Bypass Graft                times four utilizing the left internal mammary               artery and the left greater saphenous vein               harvested endoscopically.   TEE WITHOUT CARDIOVERSION                        01/22/2012     Comment:Procedure: TRANSESOPHAGEAL ECHOCARDIOGRAM               (TEE);  Surgeon: Delight Ovens, MD;                Location: [facility] OR;  Service: Open Heart Surgery;               Laterality: N/A;  Review of patient's family history indicates:   Hypotension                    Mother                   Dementia                       Mother                   Alcohol abuse  Father                   Heart disease                  Father                     Comment: MI, enlarged heart   Diabetes                       Brother                  Social History   Marital Status: Widowed             Spouse Name:                      Years of Education:                 Number of children: 4           Occupational History Occupation          Psychiatric nurse              [employer]                                     9 hour days-6                                          days/week  Social History Main Topics   Smoking Status: Former Smoker                   Packs/Day: 0.10  Years: 20        Types: Cigarettes     Quit date: 08/01/2012   Smokeless Status: Former Neurosurgeon                      Alcohol Use: Yes               Comment: red wine occassionally   Drug Use: No             Sexual Activity: No                 Other Topics            Concern   None on file  Social History Narrative   Married, widow 1972      ROS: See history of present illness otherwise negative   PHYSICAL EXAM: Elderly, in no acute distress. Neck: No JVD, HJR, Bruit, or thyroid enlargement  Lungs: Decreased breath sounds throughout with diffuse wheezing and scattered crackles  Cardiovascular: RRR, PMI not displaced, heart sounds distant, no murmurs, gallops, bruit, thrill,  or heave.  Abdomen: BS normal. Soft without organomegaly, masses, lesions or tenderness.  Extremities: Trace of ankle edema on the right and left without cyanosis, clubbing. Decreased distal pulses bilateral  SKin: Warm, no lesions or rashes   Musculoskeletal: No deformities  Neuro: no focal signs  BP 170/65  Pulse 64  Ht 5' (1.524 m)  Wt 138 lb 12.8 oz (62.959 kg)  BMI 27.11 kg/m2

## 2013-06-14 ENCOUNTER — Emergency Department: Payer: Self-pay | Admitting: Emergency Medicine

## 2013-06-14 LAB — URINALYSIS, COMPLETE
BACTERIA: NONE SEEN
BILIRUBIN, UR: NEGATIVE
BLOOD: NEGATIVE
Glucose,UR: NEGATIVE mg/dL (ref 0–75)
Ketone: NEGATIVE
Nitrite: NEGATIVE
Ph: 5 (ref 4.5–8.0)
Protein: NEGATIVE
RBC,UR: <1 /HPF (ref 0–5)
Specific Gravity: 1.014 (ref 1.003–1.030)
Squamous Epithelial: <1

## 2013-06-14 LAB — BASIC METABOLIC PANEL
Anion Gap: 4 — ABNORMAL LOW (ref 7–16)
BUN: 17 mg/dL (ref 7–18)
CO2: 31 mmol/L (ref 21–32)
Calcium, Total: 8.7 mg/dL (ref 8.5–10.1)
Chloride: 103 mmol/L (ref 98–107)
Creatinine: 0.75 mg/dL (ref 0.60–1.30)
GLUCOSE: 86 mg/dL (ref 65–99)
Osmolality: 277 (ref 275–301)
POTASSIUM: 3.7 mmol/L (ref 3.5–5.1)
Sodium: 138 mmol/L (ref 136–145)

## 2013-06-14 LAB — CBC
HCT: 36.7 % (ref 35.0–47.0)
HGB: 11.5 g/dL — ABNORMAL LOW (ref 12.0–16.0)
MCH: 28.8 pg (ref 26.0–34.0)
MCHC: 31.3 g/dL — ABNORMAL LOW (ref 32.0–36.0)
MCV: 92 fL (ref 80–100)
Platelet: 198 10*3/uL (ref 150–440)
RBC: 4 10*6/uL (ref 3.80–5.20)
RDW: 13.2 % (ref 11.5–14.5)
WBC: 7.6 10*3/uL (ref 3.6–11.0)

## 2013-06-14 LAB — TROPONIN I: Troponin-I: <0.02 ng/mL

## 2013-06-14 LAB — PRO B NATRIURETIC PEPTIDE: B-Type Natriuretic Peptide: 380 pg/mL (ref 0–450)

## 2013-06-17 ENCOUNTER — Encounter: Payer: Self-pay | Admitting: Family Medicine

## 2013-06-17 ENCOUNTER — Ambulatory Visit (INDEPENDENT_AMBULATORY_CARE_PROVIDER_SITE_OTHER): Payer: Medicare Other | Admitting: Family Medicine

## 2013-06-17 VITALS — BP 174/76 | HR 56 | Temp 98.0°F | Ht 60.0 in | Wt 131.5 lb

## 2013-06-17 DIAGNOSIS — J441 Chronic obstructive pulmonary disease with (acute) exacerbation: Secondary | ICD-10-CM

## 2013-06-17 DIAGNOSIS — M2040 Other hammer toe(s) (acquired), unspecified foot: Secondary | ICD-10-CM

## 2013-06-17 DIAGNOSIS — I10 Essential (primary) hypertension: Secondary | ICD-10-CM

## 2013-06-17 DIAGNOSIS — M79609 Pain in unspecified limb: Secondary | ICD-10-CM

## 2013-06-17 DIAGNOSIS — M79672 Pain in left foot: Secondary | ICD-10-CM

## 2013-06-17 MED ORDER — TIOTROPIUM BROMIDE MONOHYDRATE 18 MCG IN CAPS: 18.0000 ug | ORAL_CAPSULE | Freq: Every day | RESPIRATORY_TRACT | Status: DC

## 2013-06-17 MED ORDER — RAMIPRIL 10 MG PO CAPS: 10.0000 mg | ORAL_CAPSULE | Freq: Every day | ORAL | Status: DC

## 2013-06-17 MED ORDER — FLUTICASONE-SALMETEROL 250-50 MCG/DOSE IN AEPB: 1.0000 | INHALATION_SPRAY | Freq: Two times a day (BID) | RESPIRATORY_TRACT | Status: DC

## 2013-06-17 NOTE — Patient Instructions (Signed)
F/u 3 months 

## 2013-06-17 NOTE — Progress Notes (Signed)
Date:  06/17/2013   Name:  [name]   DOB:  October 07, 1931   MRN:  [id number]  Primary Physician:  Hannah Beat, MD   Chief Complaint: Hospitalization Follow-up   Subjective:   History of Present Illness:  [name] is a 80 y.o. pleasant patient who presents with the following:  The patient presents an ER followup, status post [facility] evaluation, and the patient tells me that her primary reason for presenting to the emergency room was bilateral swollen legs. She also is having some trouble breathing. The records are quite difficult to read, and they are handwritten, but it appears as if she was treated primarily for a COPD exacerbation. She is currently on some prednisone and Levaquin.  She also has recently had some quite elevated blood pressures, including today. Her beta blocker was recently elevated in the cardiology office, and her pulse is in the 50s today.  BP: 174/76 mmHg   She also has some chronic left forefoot pain with extreme deviation on the second digit laterally, as well as hammertoe formation and pain throughout and is status post bunion surgery by Dr. Orland Jarred, who also did some sort of operation on her second toe. She is having quite significant amount of pain in the forefoot, and is fairly desperate for any sort of recommendations for treatment. Left foot pain Hammer toe.   No inhalers.   HTN BP is high today.  incr altace  Randye Lobo - consult.   She reports being fairly compliant with using her Spiriva, and she is using her albuterol nebulizer solution in the morning. She is not on any sort of long-acting beta agonist or any inhaled steroids currently.   Patient Active Problem List   Diagnosis Date Noted  . History of non-ST elevation myocardial infarction (NSTEMI) 07/22/2011    Priority: High  . GIB (gastrointestinal bleeding) 04/25/2011    Priority: High  . CAD (coronary artery disease) 09/01/2002    Priority: High  . Smoking 02/09/2011      Priority: Medium  . AAA (abdominal aortic aneurysm) 12/22/2010    Priority: Medium  . COPD 05/31/2004    Priority: Medium  . HYPERLIPIDEMIA 07/02/1999    Priority: Medium  . DIABETES MELLITUS, TYPE II 07/31/1997    Priority: Medium  . HYPERTENSION 08/01/1995    Priority: Medium  . Rheumatoid arthritis 04/03/1983    Priority: Medium  . COPD exacerbation 01/13/2013  . History of GI diverticular bleed 09/25/2012  . Depression 06/17/2012  . Hx of CABG 04/18/2012  . Acute on chronic diastolic CHF (congestive heart failure) 02/05/2012  . Acute respiratory failure 02/04/2012  . HCAP (healthcare-associated pneumonia) 02/02/2012  . Splenic artery aneurysm   . DIVERTICULOSIS OF COLON 05/14/2008  . ANXIETY DEPRESSION 04/19/2008  . ECZEMA 07/21/2007  . ARTERIOVENOUS MALFORMATION, GASTRIC 11/08/2006  . INSOMNIA, CHRONIC 07/21/2006  . ANEMIA-IRON DEFICIENCY 07/01/2004  . FATTY LIVER DISEASE 07/16/2002  . OSTEOPOROSIS 12/31/2000  . OSTEOARTHRITIS 04/03/1983    Past Medical History  Diagnosis Date  . Hypertension 08/01/1995  . Rheumatoid arthritis(714.0) 04/03/1983  . Osteoarthritis 04/03/1983  . Anemia, deficiency 07/01/2004    (w/u by Dr. Lorre Nick)  . Diabetes mellitus type II 07/31/1997  . Osteoporosis 12/31/2000  . CAD (coronary artery disease) 09/01/2002    a. s/p NSTEMI 2012. b. s/p CABG 01/2012.  [name] Hyperlipemia 07/02/1999  . COPD (chronic obstructive pulmonary disease) 05/31/2004  . diverticulosis of colon   . Arteriovenous malformation of gastrointestinal tract   .  Fatty liver   . Chronic back pain   . Splenic artery aneurysm   . Anxiety and depression   . GI bleed     a. 04/2011 felt diverticular requiring microembolization by vascular.  . Adrenal mass     a. Excision 2007.  [name] Restless leg syndrome   . C. difficile colitis     a. 04/2008 = severe.  [name] Hx of CABG 04/18/2012  . Diastolic CHF     a. 02/2012: resp failure with COPD exac/CHF/pleural eff.;  b.   Echocardiogram 08/29/12: EF 55-60%, mild LVH, mild LAE, impaired LV relaxation    Past Surgical History  Procedure Laterality Date  . Nasal sinus surgery    . Inguinal hernia repair    . Partial hysterectomy      ovaries intact, dysmennorhea w/ A/P repari  . Carpal tunnel release      bilat  . Trigger finger release      right  . Wrist surgery      neuroma repair, right  . Ankle surgery      neuroma repair, right ORIF  . Total shoulder replacement  12/13/1998    right, ([facility])  . Rotator cuff repair  11/22/2003    ([facility]) left  . Epidural block injection  05/2005    x 3  . Long finger tendon realignment  10/31/2005    Meyerdierks  . Lumbar epidural  06/13/2006  . Laminectomy  06/2006    L4/5 spinal stenosis (Dr. [name])  . Laminectomy  02/24/2008    L3/4 Ant/Lat interbody fusion and Lat Artthrodesis w/plate using XLIF (Dr. [name])  . Colonic embolization  04/18/2011    Dr. Wyn Quaker, acute GI bleed  . Spinal cord stimulator implant    . Coronary artery bypass graft  01/22/2012    Procedure: CORONARY ARTERY BYPASS GRAFTING (CABG);  Surgeon: Delight Ovens, MD;  Location: [facility] OR;  Service: Open Heart Surgery;  Laterality: N/A;  Coronary Artery Bypass Graft times four utilizing the left internal mammary artery and the left greater saphenous vein harvested endoscopically.  Rhae Hammock without cardioversion  01/22/2012    Procedure: TRANSESOPHAGEAL ECHOCARDIOGRAM (TEE);  Surgeon: Delight Ovens, MD;  Location: [facility] OR;  Service: Open Heart Surgery;  Laterality: N/A;    History   Social History  . Marital Status: Widowed    Spouse Name: N/A    Number of Children: 4  . Years of Education: N/A   Occupational History  . [employer]     9 hour days-6 days/week   Social History Main Topics  . Smoking status: Former Smoker -- 0.10 packs/day for 62 years    Types: Cigarettes    Quit date: 08/01/2012  . Smokeless tobacco: Former Neurosurgeon  . Alcohol Use: Yes     Comment: red wine occassionally  .  Drug Use: No  . Sexual Activity: No   Other Topics Concern  . Not on file   Social History Narrative   Married, widow 83          Family History  Problem Relation Age of Onset  . Hypotension Mother   . Dementia Mother   . Alcohol abuse Father   . Heart disease Father     MI, enlarged heart  . Diabetes Brother     Allergies  Allergen Reactions  . Iron Diarrhea  . Morphine Sulfate Nausea And Vomiting    At high doses hallucinations Can take in low doses  . Naproxen Rash  REACTION: UNSPECIFIED    Medication list has been reviewed and updated.  Review of Systems:   GEN: No acute illnesses, no fevers, chills. GI: No n/v/d, eating normally Pulm: No SOB Interactive and getting along well at home.  Otherwise, ROS is as per the HPI.  Objective:   Physical Examination: BP 174/76  Pulse 56  Temp(Src) 98 F (36.7 C) (Oral)  Ht 5' (1.524 m)  Wt 131 lb 8 oz (59.648 kg)  BMI 25.68 kg/m2  SpO2 93%  Ideal Body Weight: Weight in (lb) to have BMI = 25: 127.7   GEN: WDWN, NAD, Non-toxic, A & O x 3 HEENT: Atraumatic, Normocephalic. Neck supple. No masses, No LAD. Ears and Nose: No external deformity. CV: RRR, No M/G/R. No JVD. No thrill. No extra heart sounds. PULM: Diffuse wheezing throughout without any focal crackles. EXTR: No c/c/e NEURO Normal gait.  PSYCH: Normally interactive. Conversant. Not depressed or anxious appearing.  Calm demeanor.   Laboratory and Imaging Data: [facility] data reviewed.  Assessment & Plan:   COPD exacerbation  Essential hypertension, benign - Plan: ramipril (ALTACE) 10 MG capsule  Foot pain, left - Plan: Ambulatory referral to Podiatry  Hammertoe - Plan: Ambulatory referral to Podiatry  Add Advair to the patient's COPD regimen. Continue oxygen.  Increase her Altace to 10 mg daily. She also has an upcoming cardiology nurse blood pressure check.  I am uncertain what to do about her forefoot pain, and her second through  fourth lateral deviation is extreme. She is interested in talking to another podiatrist, and I recommended Dr. Irving Shows.  Orders Placed This Encounter  Procedures  . Ambulatory referral to Podiatry   Patient's Medications  New Prescriptions   FLUTICASONE-SALMETEROL (ADVAIR DISKUS) 250-50 MCG/DOSE AEPB    Inhale 1 puff into the lungs 2 (two) times daily.  Previous Medications   ALBUTEROL (PROVENTIL) (5 MG/ML) 0.5% NEBULIZER SOLUTION    Take 0.5 mLs (2.5 mg total) by nebulization every 6 (six) hours as needed for wheezing.   ATORVASTATIN (LIPITOR) 40 MG TABLET    TAKE ONE TABLET BY MOUTH EVERY DAY   BISOPROLOL (ZEBETA) 5 MG TABLET    1 WHOLE TABLET DAILY   FISH OIL-OMEGA-3 FATTY ACIDS 1000 MG CAPSULE    Take 1 g by mouth daily.   GUAIFENESIN (MUCINEX) 600 MG 12 HR TABLET    Take 600 mg by mouth daily as needed.   HYDROCHLOROTHIAZIDE (HYDRODIURIL) 25 MG TABLET    Take 1 tablet (25 mg total) by mouth daily.   LACTOBACILLUS ACIDOPHILUS (BACID) TABS TABLET    Take 1 tablet by mouth daily. Probiotic   LEVOFLOXACIN (LEVAQUIN) 500 MG TABLET    Take 500 mg by mouth daily.   MORPHINE (KADIAN) 30 MG 24 HR CAPSULE    Take 30 mg by mouth every 12 (twelve) hours.   NITROGLYCERIN (NITROSTAT) 0.3 MG SL TABLET    Place 1 tablet (0.3 mg total) under the tongue every 5 (five) minutes as needed for chest pain. Max 3 per day   OXYCODONE (ROXICODONE) 15 MG IMMEDIATE RELEASE TABLET    Take 15 mg by mouth every 4 (four) hours as needed for pain.    PREDNISONE (DELTASONE) 20 MG TABLET    Take 20 mg by mouth as directed.  Modified Medications   Modified Medication Previous Medication   RAMIPRIL (ALTACE) 10 MG CAPSULE ramipril (ALTACE) 5 MG capsule      Take 1 capsule (10 mg total) by mouth daily.  Take 1 capsule (5 mg total) by mouth daily.   TIOTROPIUM (SPIRIVA HANDIHALER) 18 MCG INHALATION CAPSULE tiotropium (SPIRIVA HANDIHALER) 18 MCG inhalation capsule      Place 1 capsule (18 mcg total) into inhaler and inhale  daily.    Place 18 mcg into inhaler and inhale daily.  Discontinued Medications   No medications on file   Patient Instructions  F/u 34months   Signed,  Karleen Hampshire T. Brydan Downard, MD, CAQ Sports Medicine  [facility] [address] [place] [address] Phone: [phone] Fax: [phone]

## 2013-06-17 NOTE — Progress Notes (Signed)
Pre visit review using our clinic review tool, if applicable. No additional management support is needed unless otherwise documented below in the visit note. 

## 2013-06-18 ENCOUNTER — Telehealth: Payer: Self-pay | Admitting: Family Medicine

## 2013-06-18 NOTE — Telephone Encounter (Signed)
Relevant patient education mailed to patient.  

## 2013-06-26 ENCOUNTER — Ambulatory Visit: Payer: Medicare Other | Admitting: Nurse Practitioner

## 2013-07-09 ENCOUNTER — Ambulatory Visit (INDEPENDENT_AMBULATORY_CARE_PROVIDER_SITE_OTHER): Payer: Medicare Other | Admitting: Podiatrist

## 2013-07-09 ENCOUNTER — Encounter: Payer: Self-pay | Admitting: Podiatrist

## 2013-07-09 VITALS — Resp 16 | Ht 60.0 in | Wt 138.0 lb

## 2013-07-09 DIAGNOSIS — B351 Tinea unguium: Secondary | ICD-10-CM

## 2013-07-09 DIAGNOSIS — M2040 Other hammer toe(s) (acquired), unspecified foot: Secondary | ICD-10-CM

## 2013-07-09 DIAGNOSIS — M79609 Pain in unspecified limb: Secondary | ICD-10-CM

## 2013-07-09 DIAGNOSIS — L84 Corns and callosities: Secondary | ICD-10-CM

## 2013-07-09 NOTE — Progress Notes (Signed)
   Subjective:    Patient ID: [name], female    DOB: 12-Mar-1932, 80 y.o.   MRN: [id number]  HPI Comments: N pain  L left foot 4th digit/ trim toenails D 6 - 8 Kosik O sudden C worse A shoes Twears pads on toe, cotton, has pedicures  Toe Pain   Patient presents today for a Diabetic foot check.  She is a patient of Dr. Dallas Schimke.  Her toenails are uncomfortable and she had toes that curl under.      Review of Systems  Constitutional: Negative.   HENT: Positive for hearing loss.        Ringing in ears  Eyes: Negative.   Respiratory: Positive for shortness of breath.   Cardiovascular: Negative.   Gastrointestinal: Negative.   Endocrine: Negative.   Genitourinary: Negative.   Musculoskeletal: Positive for back pain.  Skin: Negative.   Allergic/Immunologic: Negative.   Neurological: Negative.   Hematological: Bruises/bleeds easily.  Psychiatric/Behavioral: Negative.        Objective:   Physical Exam  GENERAL APPEARANCE: Alert, conversant. Appropriately groomed. No acute distress.  VASCULAR: Pedal pulses palpable at 2/4 DP and PT bilateral.  Capillary refill time is present to all digits,  Proximal to distal cooling it warm to warm.  Digital hair growth is present bilateral  NEUROLOGIC: sensation is intact epicritically and protectively to 5.07 monofilament at 5/5 sites bilateral.  Light touch is intact bilateral, vibratory sensation intact bilateral, achilles tendon reflex is intact bilateral.  MUSCULOSKELETAL: contracture of all digits consistent with hammertoes are present.  DERMATOLOGIC: skin color, texture, and turger are within normal limits. Callus is present dorsal aspect of the left 4th toe.  Digital nails are thick, discolored, dystrophic and mycotic.       Assessment & Plan:   painful mycotic toenails, callus x 1  Plan:  Discussed etiology, pathology, conservative vs. Surgical therapies and at this time debridement of symptomatic toenails was recommended.   Onychoreduction of symptomatic toenails was performed without iatrogenic incident.  Patient was instructed on signs and symptoms of infection and was told to call immediately should any of these arise.

## 2013-07-09 NOTE — Patient Instructions (Signed)
Hammer Toes Hammer toes is a condition in which one or more of your toes is permanently flexed. CAUSES  This happens when a muscle imbalance or abnormal bone length makes your small toes buckle. This causes the toe joint to contract and the strong cord-like bands that attach muscles to the bones (tendons) in your toes to shorten.  SIGNS AND SYMPTOMS  Common symptoms of flexible hammer toes include:   A build up of skin cells (Corns). Corns occur where boney bumps come in frequent contact with hard surfaces. For example, where your shoes press and rub.  Irritation.  Inflammation.  Pain.  Limited motion in your toes DIAGNOSIS  Hammer toes are diagnosed through a physical exam of your toes.During the exam, your health care provider may try to reproduce your symptoms by manipulating your foot. Often, x-ray exams are done to determine the degree of deformity and to make sure that the cause is not a fracture.  TREATMENT  Hammer toes can be treated with corrective surgery.   Fusion Cartilage between the two bones of the affected joint is taken out and the bones fuse together into one longer bone. This helps keep your toe stable and reduces pain but leaves your toe stiff, yet straight.    procedures require fixing your toe with a pin that is visible at the tip of your toe or an implantable screw that will stay put in the toe. The pin keeps the toe straight during healing. Your health care provider will remove the pin usually within 4 8 Carras after the procedure.  Document Released: 03/16/2000 Document Revised: 01/07/2013 Document Reviewed: 11/24/2012 [facility] Patient Information 2014 [place], [place].

## 2013-08-13 ENCOUNTER — Ambulatory Visit (INDEPENDENT_AMBULATORY_CARE_PROVIDER_SITE_OTHER): Payer: Medicare Other | Admitting: Cardiology

## 2013-08-13 ENCOUNTER — Encounter: Payer: Self-pay | Admitting: Cardiology

## 2013-08-13 ENCOUNTER — Ambulatory Visit (INDEPENDENT_AMBULATORY_CARE_PROVIDER_SITE_OTHER): Payer: Medicare Other | Admitting: Podiatrist

## 2013-08-13 VITALS — BP 126/58 | HR 83 | Resp 16

## 2013-08-13 VITALS — BP 124/58 | HR 74 | Ht 60.0 in | Wt 129.1 lb

## 2013-08-13 DIAGNOSIS — M2040 Other hammer toe(s) (acquired), unspecified foot: Secondary | ICD-10-CM

## 2013-08-13 DIAGNOSIS — E785 Hyperlipidemia, unspecified: Secondary | ICD-10-CM

## 2013-08-13 DIAGNOSIS — I10 Essential (primary) hypertension: Secondary | ICD-10-CM

## 2013-08-13 DIAGNOSIS — J449 Chronic obstructive pulmonary disease, unspecified: Secondary | ICD-10-CM

## 2013-08-13 DIAGNOSIS — I2510 Atherosclerotic heart disease of native coronary artery without angina pectoris: Secondary | ICD-10-CM

## 2013-08-13 DIAGNOSIS — M6240 Contracture of muscle, unspecified site: Secondary | ICD-10-CM

## 2013-08-13 MED ORDER — ASPIRIN EC 81 MG PO TBEC: 81.0000 mg | DELAYED_RELEASE_TABLET | Freq: Every day | ORAL | Status: AC

## 2013-08-13 MED ORDER — ATORVASTATIN CALCIUM 40 MG PO TABS: 40.0000 mg | ORAL_TABLET | Freq: Every day | ORAL | Status: DC

## 2013-08-13 NOTE — Progress Notes (Signed)
Subjective: [name] presents today for followup of pain left fourth and fifth toes. She states she's been wearing a pad under the fourth and fifth toes however the fifth toe continues to curl underneath the fourth and call significant discomfort when walking. She has lateral deviation of all digits from her previous foot surgery.   Objective: Neurovascular status unchanged. Pedal pulses are palpable and strong left. Lateral deviation of digits 2 through 5 is noted left. The fourth and fifth toe plantar flex and the fifth toe is curling underneath the fourth toe and causing significant pain. Her toe deformity of digits 4 and 5 present.   Assessment: Hammertoe with curled fifth toe left   Plan:  Discussed conservative versus surgical options. Recommended a hammertoe repair of the 4th and 5th toes with possible pin fixation. The consent form was discussed and all three pages were signed and the patient's questions were encouraged and answered to the best of my ability. Risks of the surgery were discussed including but not limited to continued pain, infection, swelling, elevated toe, decreased range of motion,  Or suture or implant reaction. Preoperative instructions were also dispensed to the patient as well as a preoperative surgical pamphlet to go along with the instructions. Surgery will be scheduled at the patients convenience and patient will be seen at [facility] specialty surgery center on outpatient basis. If  any questions or concerns prior to her surgical date She is instructed to call.  I also dispensed a handmade buttress pad to wear under her 4th and 5th toes for pressure relief until we can get the surgery done for her.

## 2013-08-13 NOTE — Patient Instructions (Signed)
Your physician wants you to follow-up in: 6 MONTHS WITH DR [facility] You will receive a reminder letter in the mail two months in advance. If you don't receive a letter, please call our office to schedule the follow-up appointment.   START ASPIRIN 81 MG ONCE DAILY  START LIPITOR 40 MG ONCE DAILY  Your physician recommends that you return FOR BLOOD WORK IN 2 MONTHS= DO NOT EAT PRIOR TO LAB WORK  TAKE SPIRIVA DAILY  USE ADVAIR TWICE DAILY

## 2013-08-13 NOTE — Patient Instructions (Signed)
Pre-Operative Instructions  Congratulations, you have decided to take an important step to improving your quality of life.  You can be assured that the doctors of [facility] will be with you every step of the way.  1. Plan to be at the surgery center/hospital at least 1 (one) hour prior to your scheduled time unless otherwise directed by the surgical center/hospital staff.  You must have a responsible adult accompany you, remain during the surgery and drive you home.  Make sure you have directions to the surgical center/hospital and know how to get there on time. 2. For hospital based surgery you will need to obtain a history and physical form from your family physician within 1 month prior to the date of surgery- we will give you a form for you primary physician.  3. We make every effort to accommodate the date you request for surgery.  There are however, times where surgery dates or times have to be moved.  We will contact you as soon as possible if a change in schedule is required.   4. No Aspirin/Ibuprofen for one week before surgery.  If you are on aspirin, any non-steroidal anti-inflammatory medications (Mobic, Aleve, Ibuprofen) you should stop taking it 7 days prior to your surgery.  You make take Tylenol  For pain prior to surgery.  5. Medications- If you are taking daily heart and blood pressure medications, seizure, reflux, allergy, asthma, anxiety, pain or diabetes medications, make sure the surgery center/hospital is aware before the day of surgery so they may notify you which medications to take or avoid the day of surgery. 6. No food or drink after midnight the night before surgery unless directed otherwise by surgical center/hospital staff. 7. No alcoholic beverages 24 hours prior to surgery.  No smoking 24 hours prior to or 24 hours after surgery. 8. Wear loose pants or shorts- loose enough to fit over bandages, boots, and casts. 9. No slip on shoes, sneakers are best. 10. Bring  your boot with you to the surgery center/hospital.  Also bring crutches or a walker if your physician has prescribed it for you.  If you do not have this equipment, it will be provided for you after surgery. 11. If you have not been contracted by the surgery center/hospital by the day before your surgery, call to confirm the date and time of your surgery. 12. Leave-time from work may vary depending on the type of surgery you have.  Appropriate arrangements should be made prior to surgery with your employer. 13. Prescriptions will be provided immediately following surgery by your doctor.  Have these filled as soon as possible after surgery and take the medication as directed. 14. Remove nail polish on the operative foot. 15. Wash the night before surgery.  The night before surgery wash the foot and leg well with the antibacterial soap provided and water paying special attention to beneath the toenails and in between the toes.  Rinse thoroughly with water and dry well with a towel.  Perform this wash unless told not to do so by your physician.  Enclosed: 1 Ice pack (please put in freezer the night before surgery)   1 Hibiclens skin cleaner   Pre-op Instructions  If you have any questions regarding the instructions, do not hesitate to call our office.  [place]: [address] [place], [place] [address] [phone]  [place]: [address] , [place] [address] [phone]  [place]: [address]  [place],  [address] [phone]  Dr. Richard   Tuchman DPM, Dr. Norman Regal DPM Dr. Richard Sikora DPM, Dr. M. Todd Hyatt DPM, Dr. Marchello Rothgeb DPM 

## 2013-08-14 NOTE — Progress Notes (Signed)
Patient ID: [name], female   DOB: 1931-11-02, 80 y.o.   MRN: [id number] PCP: Dr. Patsy Lager  80 yo with history of COPD, HTN, diabetes, and CAD presents for cardiology followup.  Patient was admitted to [facility] with chest pain in 8/12.  Troponin was mildly elevated.  Left heart cath showed severe, diffuse disease in a small RCA that was not amenable to intervention.  This was the likely culprit vessel.  She also had a 70% LAD stenosis.  [name] was then done to assess for ischemia in the LAD distribution.  This showed only a fixed apical perfusion defect with no ischemia (low risk).  She was admitted to [facility] in 1/13 with a lower GI bleed that was thought to be diverticular.  She required blood transfusion.  Due to ongoing episodes of chest pain, I sent her for LHC in 10/13, and she ended up having CABG x 4.  She did well post-op and was sent to a nursing home.  She was, however, readmitted in 11/13 with acute on chronic diastolic CHF and an an acute exacerbation of COPD.  She was diuresed and treated with antibiotics.  Last echo in 5/14 showed EF 55-60%.  She has had recurrent lower GI bleeds from diverticulosis.  No recent rectal bleeding.    She has had no chest pain. She has stable exertional dyspnea with stairs or heavier housework.  Main limitation continues to be low back and hip pain.  She is using oxycodone.  She is using oxygen at night.  She smokes a cigarette occasionally.  She is not taking aspirin and is not sure why. She ran out of her atorvastatin and never refilled it.  She did not have side effects from it.  She is also off bisoprolol and is not sure why.   Labs (8/12): LDL 58, HDL 22 Labs (11/12): LDL 54, HDl 32, creatinine 0.8 Labs (4/13): K 3.5, creatinine 1.3 Labs (7/13): K 3.2, creatinine 1.0, LDL 73, HDL 32 Labs (11/13): K 3.9, creatinine 0.9 Labs (12/13): K 5.8, creatinine 0.77, BNP 103 Labs (1/14): K 4.4, creatinine 0.9 Labs (5/14): HCT 26, LDL 66 Labs (3/15): K  4.5, creatinine 0.8, BNP 338  PMH: 1. COPD 2. HTN 3. Diabetes mellitus type II 4. ? AAA: Patient says she was told by [name] that she has an aneurysm. Abdominal US (10/12) showed no AAA.  5. Chronic low back pain 6. Osteoarthritis: hip pain 7. Anemia  8. Excision of adrenal mass in 2007 9. Restless leg syndrome. 10. Bilateral shoulder replacements.  11. CAD: Cath in 2004 with moderate nonobstructive disease.  Patient was admitted to [facility] in 8/12 with NSTEMI.  LHC (8/12) showed severe diffuse disease of a small RCA.  This was the culprit lesion but it was too small and diffusely diseased for intervention.  There was a 70% proximal LAD stenosis.  Lexiscan myoview (9/12) to assess for LAD territory ischemia showed a small fixed apical defect, low risk.  EF 65% by myoview. CABG 10/13 with LIMA-LAD, SVG-D, SVG-PLOM, and SVG-RCA.  Echo (11/13): EF 55-60%, grade I diastolic dysfunction, PA systolic pressure 38 mmHg.  12. Diverticular bleed 1/13 requiring microembolization by vascular surgery.  Recurrent diverticular bleeding in 5/14.  13. ABIs (2/13) within normal limits.  14. Chronic diastolic CHF: Last echo (5/14) with EF 55-60%, mild LVH.   SH: Quit smoking in 9/12 after NSTEMI.  Restarted to quit again in 5/14.  Lives alone in [place]. 4 children.  FH: Father with MI/CHF, died in his 80s.   ROS: All systems reviewed and negative except as per HPI.    Current Outpatient Prescriptions  Medication Sig Dispense Refill  . albuterol (PROVENTIL) (5 MG/ML) 0.5% nebulizer solution Take 0.5 mLs (2.5 mg total) by nebulization every 6 (six) hours as needed for wheezing.  20 mL  12  . fish oil-omega-3 fatty acids 1000 MG capsule Take 1 g by mouth daily.      . Fluticasone-Salmeterol (ADVAIR DISKUS) 250-50 MCG/DOSE AEPB Inhale 1 puff into the lungs 2 (two) times daily.  1 each  11  . hydrochlorothiazide (HYDRODIURIL) 25 MG tablet Take 1 tablet (25 mg total) by mouth daily.  30 tablet  11  .  morphine (KADIAN) 30 MG 24 hr capsule Take 30 mg by mouth every 12 (twelve) hours.      . nitroGLYCERIN (NITROSTAT) 0.3 MG SL tablet Place 1 tablet (0.3 mg total) under the tongue every 5 (five) minutes as needed for chest pain. Max 3 per day  50 tablet  3  . oxyCODONE (ROXICODONE) 15 MG immediate release tablet Take 15 mg by mouth every 4 (four) hours as needed for pain.       . ramipril (ALTACE) 10 MG capsule Take 1 capsule (10 mg total) by mouth daily.  30 capsule  11  . tiotropium (SPIRIVA HANDIHALER) 18 MCG inhalation capsule Place 1 capsule (18 mcg total) into inhaler and inhale daily.  30 capsule  11  . aspirin EC 81 MG tablet Take 1 tablet (81 mg total) by mouth daily.  90 tablet  3  . atorvastatin (LIPITOR) 40 MG tablet Take 1 tablet (40 mg total) by mouth daily.  30 tablet  11   No current facility-administered medications for this visit.    BP 124/58  Pulse 74  Ht 5' (1.524 m)  Wt 58.568 kg (129 lb 1.9 oz)  BMI 25.22 kg/m2 General: elderly, NAD Neck: No JVD, no thyromegaly or thyroid nodule.  Lungs: Prolonged expiratory phase, occasional rhonchi.  CV: Nondisplaced PMI.  Heart regular S1/S2, no S3/S4, no murmur.  No peripheral edema.  No carotid bruit. Unable to palpate pedal pulses.  Abdomen: Soft, nontender, no hepatosplenomegaly, no distention.  Neurologic: Alert and oriented x 3.  Psych: Normal affect. Extremities: No clubbing or cyanosis.   Assessment/Plan:  CAD  Status post CABG in 10/13 with no ischemic symptoms.  - She has had no rectal bleeding since 5/14.  I would like her to resume ASA 81 mg daily.  - She will also resume atorvastatin 40 mg daily.  - She will continue ramipril.  - I think she can stay off bisoprolol for now.  HYPERLIPIDEMIA  Restart atorvastatin, needs lipids/LFTs in 2 months.  HYPERTENSION  BP is controlled.   Smoking  Back to smoking a few cigarettes a week.  I strongly encouraged her to quit.    Diastolic CHF     She does not appear  volume overloaded.  She does not need to be on Lasix.  COPD I think this is the primary culprit currently for her stable exertional dyspnea.  GI bleeding Suspected to be diverticular.  No recurrent bleeding since 5/14.  [name] 08/14/2013

## 2013-09-01 ENCOUNTER — Emergency Department: Payer: Self-pay | Admitting: Emergency Medicine

## 2013-09-01 LAB — CBC
HCT: 39.2 % (ref 35.0–47.0)
HGB: 12.9 g/dL (ref 12.0–16.0)
MCH: 29.8 pg (ref 26.0–34.0)
MCHC: 32.9 g/dL (ref 32.0–36.0)
MCV: 91 fL (ref 80–100)
PLATELETS: 144 10*3/uL — AB (ref 150–440)
RBC: 4.33 10*6/uL (ref 3.80–5.20)
RDW: 13.3 % (ref 11.5–14.5)
WBC: 6.7 10*3/uL (ref 3.6–11.0)

## 2013-09-01 LAB — BASIC METABOLIC PANEL
Anion Gap: 5 — ABNORMAL LOW (ref 7–16)
BUN: 13 mg/dL (ref 7–18)
CHLORIDE: 98 mmol/L (ref 98–107)
CREATININE: 0.83 mg/dL (ref 0.60–1.30)
Calcium, Total: 9.4 mg/dL (ref 8.5–10.1)
Co2: 31 mmol/L (ref 21–32)
EGFR (African American): >60
GLUCOSE: 117 mg/dL — AB (ref 65–99)
OSMOLALITY: 269 (ref 275–301)
Potassium: 2.9 mmol/L — ABNORMAL LOW (ref 3.5–5.1)
SODIUM: 134 mmol/L — AB (ref 136–145)

## 2013-09-01 LAB — TROPONIN I

## 2013-09-04 ENCOUNTER — Ambulatory Visit (INDEPENDENT_AMBULATORY_CARE_PROVIDER_SITE_OTHER): Payer: Medicare Other | Admitting: Family Medicine

## 2013-09-04 ENCOUNTER — Encounter: Payer: Self-pay | Admitting: Family Medicine

## 2013-09-04 VITALS — BP 116/59 | HR 86 | Temp 97.8°F | Ht 60.0 in | Wt 127.5 lb

## 2013-09-04 DIAGNOSIS — J449 Chronic obstructive pulmonary disease, unspecified: Secondary | ICD-10-CM

## 2013-09-04 DIAGNOSIS — J441 Chronic obstructive pulmonary disease with (acute) exacerbation: Secondary | ICD-10-CM

## 2013-09-04 DIAGNOSIS — I2510 Atherosclerotic heart disease of native coronary artery without angina pectoris: Secondary | ICD-10-CM

## 2013-09-04 DIAGNOSIS — I5033 Acute on chronic diastolic (congestive) heart failure: Secondary | ICD-10-CM

## 2013-09-04 DIAGNOSIS — I509 Heart failure, unspecified: Secondary | ICD-10-CM

## 2013-09-04 MED ORDER — PREDNISONE 20 MG PO TABS: ORAL_TABLET | ORAL | Status: DC

## 2013-09-04 MED ORDER — ALBUTEROL SULFATE HFA 108 (90 BASE) MCG/ACT IN AERS: 2.0000 | INHALATION_SPRAY | Freq: Four times a day (QID) | RESPIRATORY_TRACT | Status: DC | PRN

## 2013-09-04 MED ORDER — FLUTICASONE-SALMETEROL 500-50 MCG/DOSE IN AEPB: 1.0000 | INHALATION_SPRAY | Freq: Two times a day (BID) | RESPIRATORY_TRACT | Status: DC

## 2013-09-04 MED ORDER — ALBUTEROL SULFATE (5 MG/ML) 0.5% IN NEBU: 2.5000 mg | INHALATION_SOLUTION | Freq: Four times a day (QID) | RESPIRATORY_TRACT | Status: DC | PRN

## 2013-09-04 NOTE — Progress Notes (Signed)
Pre visit review using our clinic review tool, if applicable. No additional management support is needed unless otherwise documented below in the visit note. 

## 2013-09-04 NOTE — Assessment & Plan Note (Signed)
Refilled chronic meds.. She has been using 500 /50 advair so continued her on this dose.

## 2013-09-04 NOTE — Assessment & Plan Note (Signed)
Bacterial infection appear s resolved.  Continued COPD exac... Treat with steroids.

## 2013-09-04 NOTE — Assessment & Plan Note (Signed)
No sign of fluid overload.

## 2013-09-04 NOTE — Patient Instructions (Addendum)
Complete a 6 day taper of steroids. Use neb or inhaler for rescue. Follow up with [name] next Tuesday available. If SOB is not improving, come in sooner.

## 2013-09-04 NOTE — Progress Notes (Signed)
   Subjective:    Patient ID: [name], female    DOB: 12-04-1931, 80 y.o.   MRN: [id number]  HPI  80 year old female pt  of Dr. Cyndie Chime  With history of COPD, CHF, CAD presents with  Recent worsening of COPD.  In last few [place] she has had a worsening of SOB, wheezing.  Went work in 2 [place] ago. Given breathing treatment. Start on antibiotics.  Completed today.  CXR: ? Possible early PNA Was not getting better... [facility] days [facility]. Chest CT: clear Given IV steroid. No oral prednisone given.  She is some better but not back to normal self.  This morning SOB was severe again, better now after nebs etc.  Increase nighttime O2 to 3 L. She is using  albuterol nebs twice a day. Helps significantly, but temporarily.  She is chronically on  500/50 Advair, spiriva   She went without oxygen one night early in week given electricity out. Increased cough, white a foamy. No fever.      Review of Systems  Constitutional: Negative for fever and fatigue.  HENT: Negative for ear pain.   Eyes: Negative for pain.  Respiratory: Positive for shortness of breath. Negative for chest tightness.   Cardiovascular: Negative for chest pain, palpitations and leg swelling.  Gastrointestinal: Negative for abdominal pain.  Genitourinary: Negative for dysuria.       Objective:   Physical Exam  Constitutional: Vital signs are normal. She appears well-developed and well-nourished. She is cooperative.  Non-toxic appearance. She does not appear ill. No distress.  HENT:  Head: Normocephalic.  Right Ear: Hearing, tympanic membrane, external ear and ear canal normal. Tympanic membrane is not erythematous, not retracted and not bulging.  Left Ear: Hearing, tympanic membrane, external ear and ear canal normal. Tympanic membrane is not erythematous, not retracted and not bulging.  Nose: No mucosal edema or rhinorrhea. Right sinus exhibits no maxillary sinus tenderness and no frontal sinus  tenderness. Left sinus exhibits no maxillary sinus tenderness and no frontal sinus tenderness.  Mouth/Throat: Uvula is midline, oropharynx is clear and moist and mucous membranes are normal.  Eyes: Conjunctivae, EOM and lids are normal. Pupils are equal, round, and reactive to light. Lids are everted and swept, no foreign bodies found.  Neck: Trachea normal and normal range of motion. Neck supple. Carotid bruit is not present. No mass and no thyromegaly present.  Cardiovascular: Normal rate, regular rhythm, S1 normal, S2 normal, normal heart sounds, intact distal pulses and normal pulses.  Exam reveals no gallop and no friction rub.   No murmur heard. Pulmonary/Chest: Effort normal. Not tachypneic. No respiratory distress. She has no decreased breath sounds. She has wheezes. She has rhonchi. She has no rales.  Abdominal: Soft. Normal appearance and bowel sounds are normal. There is no tenderness.  Neurological: She is alert.  Skin: Skin is warm, dry and intact. No rash noted.  Psychiatric: Her speech is normal and behavior is normal. Judgment and thought content normal. Her mood appears not anxious. Cognition and memory are normal. She does not exhibit a depressed mood.          Assessment & Plan:

## 2013-09-16 DIAGNOSIS — M2040 Other hammer toe(s) (acquired), unspecified foot: Secondary | ICD-10-CM

## 2013-09-22 ENCOUNTER — Encounter: Payer: Self-pay | Admitting: Family Medicine

## 2013-09-22 ENCOUNTER — Ambulatory Visit (INDEPENDENT_AMBULATORY_CARE_PROVIDER_SITE_OTHER): Payer: Medicare Other | Admitting: Family Medicine

## 2013-09-22 VITALS — BP 120/62 | HR 71 | Temp 97.9°F | Wt 135.2 lb

## 2013-09-22 DIAGNOSIS — J449 Chronic obstructive pulmonary disease, unspecified: Secondary | ICD-10-CM

## 2013-09-22 DIAGNOSIS — J441 Chronic obstructive pulmonary disease with (acute) exacerbation: Secondary | ICD-10-CM

## 2013-09-22 DIAGNOSIS — I2510 Atherosclerotic heart disease of native coronary artery without angina pectoris: Secondary | ICD-10-CM

## 2013-09-22 NOTE — Progress Notes (Signed)
Pre visit review using our clinic review tool, if applicable. No additional management support is needed unless otherwise documented below in the visit note. 

## 2013-09-22 NOTE — Progress Notes (Signed)
   Subjective:    Patient ID: [name], female    DOB: [birth year], 80 y.o.   MRN: [id number]  HPI  80 year old female pt of Dr. Cyndie Chime presents for follow up COPD exacerbation.  She reports that now after completion of antibiotics and prednisone taper she is now baseline at this point.   Less SOB, mild daily cough, no fever.  No chest pain.    Review of Systems  Constitutional: Negative for fever and fatigue.  HENT: Negative for ear pain.   Eyes: Negative for pain.  Respiratory: Negative for chest tightness and shortness of breath.   Cardiovascular: Negative for chest pain, palpitations and leg swelling.  Gastrointestinal: Negative for abdominal pain.  Genitourinary: Negative for dysuria.       Objective:   Physical Exam  Constitutional: Vital signs are normal. She appears well-developed and well-nourished. She is cooperative.  Non-toxic appearance. She does not appear ill. No distress.  Elderly female with kyphosis  HENT:  Head: Normocephalic.  Right Ear: Hearing, tympanic membrane, external ear and ear canal normal. Tympanic membrane is not erythematous, not retracted and not bulging.  Left Ear: Hearing, tympanic membrane, external ear and ear canal normal. Tympanic membrane is not erythematous, not retracted and not bulging.  Nose: No mucosal edema or rhinorrhea. Right sinus exhibits no maxillary sinus tenderness and no frontal sinus tenderness. Left sinus exhibits no maxillary sinus tenderness and no frontal sinus tenderness.  Mouth/Throat: Uvula is midline, oropharynx is clear and moist and mucous membranes are normal.  Eyes: Conjunctivae, EOM and lids are normal. Pupils are equal, round, and reactive to light. Lids are everted and swept, no foreign bodies found.  Neck: Trachea normal and normal range of motion. Neck supple. Carotid bruit is not present. No mass and no thyromegaly present.  Cardiovascular: Normal rate, regular rhythm, S1 normal, S2 normal, normal  heart sounds, intact distal pulses and normal pulses.  Exam reveals no gallop and no friction rub.   No murmur heard. Pulmonary/Chest: Effort normal. Not tachypneic. No respiratory distress. She has decreased breath sounds. She has no wheezes. She has no rhonchi. She has no rales.  Rare wheeze  Abdominal: Soft. Normal appearance and bowel sounds are normal. There is no tenderness.  Neurological: She is alert.  Skin: Skin is warm, dry and intact. No rash noted.  Psychiatric: Her speech is normal and behavior is normal. Judgment and thought content normal. Her mood appears not anxious. Cognition and memory are normal. She does not exhibit a depressed mood.          Assessment & Plan:

## 2013-09-22 NOTE — Assessment & Plan Note (Signed)
Resolved. No back at baseline.

## 2013-09-22 NOTE — Patient Instructions (Addendum)
Continue nebs or inhalers as needed. Continue twice daily Advair and daily spiriva. Follow up as needed.

## 2013-09-22 NOTE — Assessment & Plan Note (Signed)
Encouraged her to take spiriva and advair daily.

## 2013-09-23 ENCOUNTER — Ambulatory Visit: Payer: Medicare Other | Admitting: Family Medicine

## 2013-09-24 ENCOUNTER — Ambulatory Visit (INDEPENDENT_AMBULATORY_CARE_PROVIDER_SITE_OTHER): Payer: Medicare Other

## 2013-09-24 ENCOUNTER — Ambulatory Visit (INDEPENDENT_AMBULATORY_CARE_PROVIDER_SITE_OTHER): Payer: Medicare Other | Admitting: Podiatrist

## 2013-09-24 VITALS — BP 108/52 | HR 64 | Resp 16

## 2013-09-24 DIAGNOSIS — M2040 Other hammer toe(s) (acquired), unspecified foot: Secondary | ICD-10-CM

## 2013-09-24 DIAGNOSIS — Z9889 Other specified postprocedural states: Secondary | ICD-10-CM

## 2013-09-24 NOTE — Progress Notes (Signed)
Subjective: Patient presents today1 week status post foot surgery of the left foot.  Date of surgery 09/16/2013-  Hammertoe repair with pin fixation of digits 4,5 left foot performed. Patient denies nausea, vomiting, fevers, chills or night sweats.  Denies calf pain or tenderness to the operative side. Relates that she artery to her pain medication however she does not want anymore as she has a pain doctor and does not want to violate her contract.  Objective:  Neurovascular status is intact with palpable pedal pulses DP and PT bilateral at 2+ out of 4. Neurological sensation is intact and unchanged as per prior to surgery. Excellent appearance of the postoperative foot is noted. No redness, no swelling, no signs of infection are present. Rectus appearance of digits 4, 5 left foot with temporary pin fixation in place. Incision site is well coapted with sutures in place.  Assessment: Status post hammertoe repair digits 4, 5 left foot  Plan:  Dry sterile compressive dressing was applied. Patient was instructed to keep the dressing clean dry and intact. Also her back in next week for removal of sutures. Will probably remove the pins in 4-5 [place] postop.

## 2013-09-24 NOTE — Patient Instructions (Signed)
Continue to keep your bandage on your foot and keep it clean.  Your foot looks great!  We will take out the sutures next visit.  Call if you have any questions or concerns!

## 2013-09-28 ENCOUNTER — Encounter: Payer: Self-pay | Admitting: Podiatry

## 2013-09-28 ENCOUNTER — Ambulatory Visit (INDEPENDENT_AMBULATORY_CARE_PROVIDER_SITE_OTHER): Payer: Medicare Other

## 2013-09-28 ENCOUNTER — Ambulatory Visit (INDEPENDENT_AMBULATORY_CARE_PROVIDER_SITE_OTHER): Payer: Medicare Other | Admitting: Podiatry

## 2013-09-28 DIAGNOSIS — M2040 Other hammer toe(s) (acquired), unspecified foot: Secondary | ICD-10-CM

## 2013-09-28 DIAGNOSIS — M2042 Other hammer toe(s) (acquired), left foot: Secondary | ICD-10-CM

## 2013-09-28 DIAGNOSIS — Z9889 Other specified postprocedural states: Secondary | ICD-10-CM

## 2013-09-28 NOTE — Progress Notes (Signed)
This is a patient of Dr. Lillia Carmel who had recently had surgery to her fourth and fifth digits of her left foot. She states that she stepped down wrong well and her foot and pulling her pin from her fifth toe. She's very worried about this pin because her fifth toe with the toe that was the most problematic. She denies fever chills nausea vomiting muscle aches and pains.  Objective: Vital signs are stable she is alert and oriented x3. Her left foot appears to be fine grossly however the into the fifth toe is pulled out slightly and on radiograph it appears that it is only within the distal most portion of the proximal phalanx possibly fracturing of the proximal phalanx distally. However the pin feels stable within the toe and at this point we will leave it in the toe and the Dr. Donzetta Kohut decides the blood out.  Assessment: Trauma postop foot fifth toe left K wire regression.  Plan: Redressed the foot today dressed a compressive dressing will followup with Dr. Donzetta Kohut on Thursday.

## 2013-10-01 ENCOUNTER — Ambulatory Visit (INDEPENDENT_AMBULATORY_CARE_PROVIDER_SITE_OTHER): Payer: Medicare Other | Admitting: Podiatrist

## 2013-10-01 VITALS — BP 101/51 | HR 82 | Resp 16

## 2013-10-01 DIAGNOSIS — M2040 Other hammer toe(s) (acquired), unspecified foot: Secondary | ICD-10-CM

## 2013-10-01 DIAGNOSIS — M2042 Other hammer toe(s) (acquired), left foot: Secondary | ICD-10-CM

## 2013-10-01 NOTE — Progress Notes (Signed)
Subjective: Patient presents today 2 week status post foot surgery of the left foot. Date of surgery 09/16/2013- Hammertoe repair with pin fixation of digits 4,5 left foot performed.  She relates she fell and pulled part of the pin out on her 5th toe.  Dr. Al Corpus saw it and related it was OK but may have cracked the proxmial phalanx on xray.  Overall she is doing well this week.  Objective: Neurovascular status is intact with palpable pedal pulses DP and PT bilateral at 2+ out of 4. Neurological sensation is intact and unchanged as per prior to surgery. Excellent appearance of the postoperative foot is noted. No redness, no swelling, no signs of infection are present. Rectus appearance of digits 4, 5 left foot with temporary pin fixation in place-- distraction of the pin on the 5th digit is seen but is acceptable. Incision site is well coapted with sutures removed today.   Assessment: Status post hammertoe repair digits 4, 5 left foot   Plan: sutures removed.  Pins kept in.  Dry sterile compressive dressing was applied. Patient was instructed to keep the dressing clean dry and intact. Will schedule her back in 2 Seats for pin removal.

## 2013-10-08 ENCOUNTER — Ambulatory Visit (INDEPENDENT_AMBULATORY_CARE_PROVIDER_SITE_OTHER): Payer: Medicare Other | Admitting: Podiatrist

## 2013-10-08 VITALS — BP 139/63 | HR 67 | Resp 16

## 2013-10-08 DIAGNOSIS — Z9889 Other specified postprocedural states: Secondary | ICD-10-CM

## 2013-10-08 NOTE — Progress Notes (Signed)
Subjective: Patient presents today  status post foot surgery of the left foot. Date of surgery 09/16/2013- Hammertoe repair with pin fixation of digits 4,5 left foot performed. She relates she is having some minor discomfort in the toes which is to be expected after her surgery.  Objective: Neurovascular status is intact with palpable pedal pulses DP and PT bilateral at 2+ out of 4. Neurological sensation is intact and unchanged as per prior to surgery. Excellent appearance of the postoperative foot is noted. No redness, no swelling, no signs of infection are present. Rectus appearance of digits 4, 5 left foot with temporary pin fixation in place-- pins are removed today without complication.  Assessment: Status post hammertoe repair digits 4, 5 left foot   Plan: Pins are removed Dry sterile compressive dressing was applied. A buttress pad was also applied she will continue to wear her [place] shoe for 2 [place] and now see her back at that point for evaluation of if she can get into her tennis shoes.

## 2013-10-08 NOTE — Patient Instructions (Signed)
Wear the little pad to keep your toes lifted up-- keep wearing your surgical shoe till I see you in 2 more [place] to make sure it is ready to get into your regular shoe.  Bring your regular shoe with you at the next visit.

## 2013-10-13 ENCOUNTER — Other Ambulatory Visit: Payer: Medicare Other

## 2013-10-15 ENCOUNTER — Ambulatory Visit (INDEPENDENT_AMBULATORY_CARE_PROVIDER_SITE_OTHER): Payer: Medicare Other | Admitting: Podiatrist

## 2013-10-15 ENCOUNTER — Encounter: Payer: Self-pay | Admitting: Podiatrist

## 2013-10-15 ENCOUNTER — Telehealth: Payer: Self-pay | Admitting: Family Medicine

## 2013-10-15 ENCOUNTER — Ambulatory Visit (INDEPENDENT_AMBULATORY_CARE_PROVIDER_SITE_OTHER): Payer: Medicare Other

## 2013-10-15 VITALS — BP 137/60 | HR 69 | Resp 12

## 2013-10-15 DIAGNOSIS — M2040 Other hammer toe(s) (acquired), unspecified foot: Secondary | ICD-10-CM

## 2013-10-15 NOTE — Telephone Encounter (Signed)
Handicap Placard completed.  [name] notified application is ready to be picked up at front desk.

## 2013-10-15 NOTE — Progress Notes (Signed)
Subjective: Patient presents today status post foot surgery of the left foot. Date of surgery 09/16/2013- Hammertoe repair with pin fixation of digits 4,5 left foot performed. She relates she is having some soreness and minor discomfort in the toes and they continue to swell some. Overall she relates that they are no longer underlapped in and she does not walk on them in her shoes any longer.   Objective: Neurovascular status is intact with palpable pedal pulses DP and PT bilateral at 2+ out of 4. Neurological sensation is intact and unchanged as per prior to surgery. Excellent appearance of the postoperative foot is noted. No redness, no swelling, no signs of infection are present. Rectus appearance of digits 4, 5 left foot with mild plantar flexion which is soft tissue in nature noted. Very mild swelling is present which is to be expected status post arthroplasties   Assessment: Status post hammertoe repair digits 4, 5 left foot   Plan: I taped toes 4 and 5 up to keep them elevated and recommended continuation of the buttress pads. I will see her back in 3 [place] for followup.

## 2013-10-15 NOTE — Telephone Encounter (Signed)
Pt dropped off handicap form to be signed by Dr. Patsy Lager. Pt would like to pick up form tomorrow 10/15/13 before she goes out of town for the weekend. I informed her Dr. Patsy Lager will not be back in office until Monday. She asked if [name] could sign it because the last few times she has been seen it has been by [name]. I told pt I would check if not then it would be sometime next week. Please call [phone] when complete. Forms in [place] in box.

## 2013-10-22 ENCOUNTER — Encounter: Payer: Medicare Other | Admitting: Podiatrist

## 2013-10-23 ENCOUNTER — Other Ambulatory Visit: Payer: Self-pay | Admitting: *Deleted

## 2013-10-23 ENCOUNTER — Other Ambulatory Visit (INDEPENDENT_AMBULATORY_CARE_PROVIDER_SITE_OTHER): Payer: Medicare Other

## 2013-10-23 DIAGNOSIS — E785 Hyperlipidemia, unspecified: Secondary | ICD-10-CM

## 2013-10-23 DIAGNOSIS — I10 Essential (primary) hypertension: Secondary | ICD-10-CM

## 2013-10-23 DIAGNOSIS — E876 Hypokalemia: Secondary | ICD-10-CM

## 2013-10-23 DIAGNOSIS — I2510 Atherosclerotic heart disease of native coronary artery without angina pectoris: Secondary | ICD-10-CM

## 2013-10-23 LAB — HEPATIC FUNCTION PANEL
ALK PHOS: 41 U/L (ref 39–117)
ALT: 13 U/L (ref 0–35)
AST: 18 U/L (ref 0–37)
Albumin: 3.6 g/dL (ref 3.5–5.2)
BILIRUBIN DIRECT: 0.1 mg/dL (ref 0.0–0.3)
Total Bilirubin: 0.7 mg/dL (ref 0.2–1.2)
Total Protein: 6.8 g/dL (ref 6.0–8.3)

## 2013-10-23 LAB — LIPID PANEL
CHOL/HDL RATIO: 4
Cholesterol: 117 mg/dL (ref 0–200)
HDL: 31.3 mg/dL — AB (ref >39.00)
LDL CALC: 74 mg/dL (ref 0–99)
NonHDL: 85.7
TRIGLYCERIDES: 59 mg/dL (ref 0.0–149.0)
VLDL: 11.8 mg/dL (ref 0.0–40.0)

## 2013-10-23 LAB — BASIC METABOLIC PANEL
BUN: 19 mg/dL (ref 6–23)
CHLORIDE: 103 meq/L (ref 96–112)
CO2: 28 meq/L (ref 19–32)
CREATININE: 0.8 mg/dL (ref 0.4–1.2)
Calcium: 9.6 mg/dL (ref 8.4–10.5)
GFR: 73.36 mL/min (ref >60.00)
Glucose, Bld: 100 mg/dL — ABNORMAL HIGH (ref 70–99)
Potassium: 3.4 mEq/L — ABNORMAL LOW (ref 3.5–5.1)
Sodium: 138 mEq/L (ref 135–145)

## 2013-10-23 MED ORDER — POTASSIUM CHLORIDE CRYS ER 20 MEQ PO TBCR: 20.0000 meq | EXTENDED_RELEASE_TABLET | Freq: Every day | ORAL | Status: DC

## 2013-10-29 ENCOUNTER — Ambulatory Visit (INDEPENDENT_AMBULATORY_CARE_PROVIDER_SITE_OTHER): Payer: Medicare Other | Admitting: Cardiology

## 2013-10-29 ENCOUNTER — Encounter: Payer: Self-pay | Admitting: Cardiology

## 2013-10-29 VITALS — BP 132/58 | HR 62 | Ht 60.0 in | Wt 134.0 lb

## 2013-10-29 DIAGNOSIS — J4489 Other specified chronic obstructive pulmonary disease: Secondary | ICD-10-CM

## 2013-10-29 DIAGNOSIS — I2510 Atherosclerotic heart disease of native coronary artery without angina pectoris: Secondary | ICD-10-CM

## 2013-10-29 DIAGNOSIS — J449 Chronic obstructive pulmonary disease, unspecified: Secondary | ICD-10-CM

## 2013-10-29 DIAGNOSIS — I5032 Chronic diastolic (congestive) heart failure: Secondary | ICD-10-CM

## 2013-10-29 DIAGNOSIS — I10 Essential (primary) hypertension: Secondary | ICD-10-CM

## 2013-10-29 NOTE — Progress Notes (Signed)
Patient ID: [name], female   DOB: 1931-11-30, 80 y.o.   MRN: [id number] PCP: [name]  80 yo with history of COPD, HTN, diabetes, and CAD presents for cardiology followup.  Patient was admitted to [facility] with chest pain in 8/12.  Troponin was mildly elevated.  Left heart cath showed severe, diffuse disease in a small RCA that was not amenable to intervention.  This was the likely culprit vessel.  She also had a 70% LAD stenosis.  [name] was then done to assess for ischemia in the LAD distribution.  This showed only a fixed apical perfusion defect with no ischemia (low risk).  She was admitted to [facility] in 1/13 with a lower GI bleed that was thought to be diverticular.  She required blood transfusion.  Due to ongoing episodes of chest pain, I sent her for LHC in 10/13, and she ended up having CABG x 4.  She did well post-op and was sent to a nursing home.  She was, however, readmitted in 11/13 with acute on chronic diastolic CHF and an an acute exacerbation of COPD.  She was diuresed and treated with antibiotics.  Last echo in 5/14 showed EF 55-60%.  She has had recurrent lower GI bleeds from diverticulosis.  No recent rectal bleeding.    She has had no chest pain. She has stable exertional dyspnea with stairs or heavier housework.  Main limitation continues to be low back and hip pain.  She is using oxygen at night.  She has completely quit smoking and is taking all her medications.    ECG: NSR, LVH, old ASMI  Labs (8/12): LDL 58, HDL 22 Labs (11/12): LDL 54, HDl 32, creatinine 0.8 Labs (4/13): K 3.5, creatinine 1.3 Labs (7/13): K 3.2, creatinine 1.0, LDL 73, HDL 32 Labs (11/13): K 3.9, creatinine 0.9 Labs (12/13): K 5.8, creatinine 0.77, BNP 103 Labs (1/14): K 4.4, creatinine 0.9 Labs (5/14): HCT 26, LDL 66 Labs (3/15): K 4.5, creatinine 0.8, BNP 338 Labs (7/15): K 3.4, creatinine 0.8, LDL 74, HDL 31  PMH: 1. COPD 2. HTN 3. Diabetes mellitus type II 4. ? AAA: Patient says  she was told by [name] that she has an aneurysm. Abdominal US (10/12) showed no AAA.  5. Chronic low back pain 6. Osteoarthritis: hip pain 7. Anemia  8. Excision of adrenal mass in 2007 9. Restless leg syndrome. 10. Bilateral shoulder replacements.  11. CAD: Cath in 2004 with moderate nonobstructive disease.  Patient was admitted to [facility] in 8/12 with NSTEMI.  LHC (8/12) showed severe diffuse disease of a small RCA.  This was the culprit lesion but it was too small and diffusely diseased for intervention.  There was a 70% proximal LAD stenosis.  Lexiscan myoview (9/12) to assess for LAD territory ischemia showed a small fixed apical defect, low risk.  EF 65% by myoview. CABG 10/13 with LIMA-LAD, SVG-D, SVG-PLOM, and SVG-RCA.  Echo (11/13): EF 55-60%, grade I diastolic dysfunction, PA systolic pressure 38 mmHg.  12. Diverticular bleed 1/13 requiring microembolization by vascular surgery.  Recurrent diverticular bleeding in 5/14.  13. ABIs (2/13) within normal limits.  14. Chronic diastolic CHF: Last echo (5/14) with EF 55-60%, mild LVH.   SH: Quit smoking in 9/12 after NSTEMI.  Restarted to quit again in 5/14 and quit in 6/15.  Lives alone in [place]. 4 children.    FH: Father with MI/CHF, died in his 73s.   ROS: All systems reviewed and negative except as per  HPI.    Current Outpatient Prescriptions  Medication Sig Dispense Refill  . albuterol (PROVENTIL HFA;VENTOLIN HFA) 108 (90 BASE) MCG/ACT inhaler Inhale 2 puffs into the lungs every 6 (six) hours as needed for wheezing or shortness of breath.  1 Inhaler  2  . albuterol (PROVENTIL) (5 MG/ML) 0.5% nebulizer solution Take 0.5 mLs (2.5 mg total) by nebulization every 6 (six) hours as needed for wheezing.  20 mL  12  . aspirin EC 81 MG tablet Take 1 tablet (81 mg total) by mouth daily.  90 tablet  3  . atorvastatin (LIPITOR) 40 MG tablet Take 1 tablet (40 mg total) by mouth daily.  30 tablet  11  . Fluticasone-Salmeterol (ADVAIR  DISKUS) 500-50 MCG/DOSE AEPB Inhale 1 puff into the lungs 2 (two) times daily.  60 each  11  . hydrochlorothiazide (HYDRODIURIL) 25 MG tablet Take 1 tablet (25 mg total) by mouth daily.  30 tablet  11  . morphine (KADIAN) 30 MG 24 hr capsule Take 30 mg by mouth every 12 (twelve) hours.      . nitroGLYCERIN (NITROSTAT) 0.3 MG SL tablet Place 1 tablet (0.3 mg total) under the tongue every 5 (five) minutes as needed for chest pain. Max 3 per day  50 tablet  3  . Omega-3 Fatty Acids (FISH OIL PO) Take 600 mg by mouth daily.      [name] oxyCODONE (ROXICODONE) 15 MG immediate release tablet Take 15 mg by mouth every 4 (four) hours as needed for pain.       . potassium chloride SA (K-DUR,KLOR-CON) 20 MEQ tablet Take 1 tablet (20 mEq total) by mouth daily.  90 tablet  3  . psyllium ([employer] FIBER) 0.52 G capsule Take 0.52 g by mouth daily.      . ramipril (ALTACE) 10 MG capsule Take 1 capsule (10 mg total) by mouth daily.  30 capsule  11  . tiotropium (SPIRIVA HANDIHALER) 18 MCG inhalation capsule Place 1 capsule (18 mcg total) into inhaler and inhale daily.  30 capsule  11   No current facility-administered medications for this visit.    BP 132/58  Pulse 62  Ht 5' (1.524 m)  Wt 134 lb (60.782 kg)  BMI 26.17 kg/m2 General: elderly, NAD Neck: No JVD, no thyromegaly or thyroid nodule.  Lungs: Prolonged expiratory phase, occasional rhonchi.  CV: Nondisplaced PMI.  Heart regular S1/S2, no S3/S4, no murmur.  No peripheral edema.  No carotid bruit. Unable to palpate pedal pulses.  Abdomen: Soft, nontender, no hepatosplenomegaly, no distention.  Neurologic: Alert and oriented x 3.  Psych: Normal affect. Extremities: No clubbing or cyanosis.   Assessment/Plan:  CAD  Status post CABG in 10/13 with no ischemic symptoms.  - She has had no rectal bleeding since 5/14.  Continue ASA 81 daily.  - Continue atorvastatin and ramipril.  - Staying of beta blocker with COPD.  HYPERLIPIDEMIA  Good lipids in 7/15.    HYPERTENSION  BP is controlled.   Smoking  She has finally quit.     Diastolic CHF     She does not appear volume overloaded.  She does not need to be on Lasix.  COPD I think this is the primary culprit currently for her stable exertional dyspnea.  GI bleeding Suspected to be diverticular.  No recurrent bleeding since 5/14.  Marca Ancona 10/29/2013

## 2013-10-29 NOTE — Patient Instructions (Signed)
Your physician recommends that you schedule a follow-up appointment in: 4 months with Dr Shirlee Latch  Your physician recommends that you have lab work today: BMP

## 2013-10-30 LAB — BASIC METABOLIC PANEL
BUN: 22 mg/dL (ref 6–23)
CALCIUM: 9.7 mg/dL (ref 8.4–10.5)
CO2: 30 mEq/L (ref 19–32)
Chloride: 105 mEq/L (ref 96–112)
Creatinine, Ser: 0.8 mg/dL (ref 0.4–1.2)
GFR: 70.31 mL/min (ref >60.00)
GLUCOSE: 82 mg/dL (ref 70–99)
Potassium: 4.5 mEq/L (ref 3.5–5.1)
Sodium: 141 mEq/L (ref 135–145)

## 2013-11-05 ENCOUNTER — Ambulatory Visit (INDEPENDENT_AMBULATORY_CARE_PROVIDER_SITE_OTHER): Payer: Medicare Other

## 2013-11-05 ENCOUNTER — Ambulatory Visit (INDEPENDENT_AMBULATORY_CARE_PROVIDER_SITE_OTHER): Payer: Medicare Other | Admitting: Podiatrist

## 2013-11-05 VITALS — BP 110/52 | HR 80 | Resp 16

## 2013-11-05 DIAGNOSIS — Z9889 Other specified postprocedural states: Secondary | ICD-10-CM

## 2013-11-05 DIAGNOSIS — M2040 Other hammer toe(s) (acquired), unspecified foot: Secondary | ICD-10-CM

## 2013-11-05 NOTE — Patient Instructions (Signed)
Your toes look great-- the swelling will resolve at 3 months-- if does not improve on its own, please call and let me know.

## 2013-11-05 NOTE — Progress Notes (Signed)
Subjective: Patient presents today status post foot surgery of the left foot. Date of surgery 09/16/2013- Hammertoe repair with pin fixation of digits 4,5 left foot performed. She relates she is still having some swelling and soreness in the toes. She is able to wear the pad to elevate the toes and she states this helped significantly when she walks and in her shoes.   Objective: Neurovascular status is intact with palpable pedal pulses DP and PT bilateral at 2+ out of 4. Neurological sensation is intact and unchanged as per prior to surgery. Excellent appearance of the postoperative foot is noted. No redness, mild swelling is present, no signs of infection are present. A more rectus appearance of digits 4, 5 left foot is noted in comparison to the preoperative presentation with mild plantar flexion which is soft tissue in nature noted.   Assessment: Status post hammertoe repair digits 4, 5 left foot   Plan: I dispensed a permanent buttress pad for 4 and 5 up to keep them elevated and recommended continuation of the buttress pads. I discussed that the toes will likely swell for up to one more month and that as long as they are relatively pain free we will do well. If she has any problems or concerns she will call.

## 2013-11-06 ENCOUNTER — Other Ambulatory Visit: Payer: PRIVATE HEALTH INSURANCE

## 2013-11-09 ENCOUNTER — Other Ambulatory Visit (INDEPENDENT_AMBULATORY_CARE_PROVIDER_SITE_OTHER): Payer: Medicare Other

## 2013-11-09 DIAGNOSIS — E876 Hypokalemia: Secondary | ICD-10-CM

## 2013-11-10 LAB — BASIC METABOLIC PANEL
BUN: 25 mg/dL — ABNORMAL HIGH (ref 6–23)
CALCIUM: 9.5 mg/dL (ref 8.4–10.5)
CO2: 26 mEq/L (ref 19–32)
CREATININE: 1.2 mg/dL (ref 0.4–1.2)
Chloride: 102 mEq/L (ref 96–112)
GFR: 48.26 mL/min — ABNORMAL LOW (ref >60.00)
Glucose, Bld: 102 mg/dL — ABNORMAL HIGH (ref 70–99)
Potassium: 3.7 mEq/L (ref 3.5–5.1)
Sodium: 138 mEq/L (ref 135–145)

## 2013-11-27 ENCOUNTER — Ambulatory Visit (INDEPENDENT_AMBULATORY_CARE_PROVIDER_SITE_OTHER): Payer: Medicare Other | Admitting: Family Medicine

## 2013-11-27 ENCOUNTER — Encounter: Payer: Self-pay | Admitting: Family Medicine

## 2013-11-27 VITALS — BP 150/80 | HR 73 | Temp 98.3°F | Ht 60.0 in | Wt 137.0 lb

## 2013-11-27 DIAGNOSIS — I2510 Atherosclerotic heart disease of native coronary artery without angina pectoris: Secondary | ICD-10-CM

## 2013-11-27 DIAGNOSIS — M79604 Pain in right leg: Secondary | ICD-10-CM | POA: Insufficient documentation

## 2013-11-27 DIAGNOSIS — M79609 Pain in unspecified limb: Secondary | ICD-10-CM

## 2013-11-27 NOTE — Patient Instructions (Signed)
If gabapentin 300 mg twice daily not helping after 1 week you can try increasing to three times day if not oversedated. Follow up with pain specialist or PCP if not improving.

## 2013-11-27 NOTE — Progress Notes (Signed)
   Subjective:    Patient ID: [name], female    DOB: Aug 03, 1931, 80 y.o.   MRN: [id number]  HPI  80 year old female pt of Dr. Co[pland's with complicated medical history including CHF, CAD, COPD presents for new onset right lateral calf burning x 3 Burck.  She has a history of  low back pain, degenerative disc disease. Followed by pain MD Dr. Laurian Brim. No surgical option per Dr. [name].  She saw him in last few [place]. Start gababpentin 300 mg, she has increased to 300 mg twice daily started yesterday.. No relief. No SE.  No numbness or weakness in B legs.  she is on morhne and oxycodone for back pain chronically.   Pain is constantly. No swelling, no redness.   Review of Systems  Constitutional: Negative for fever and fatigue.  HENT: Negative for ear pain.   Eyes: Negative for pain.  Respiratory: Negative for chest tightness and shortness of breath.   Cardiovascular: Negative for chest pain, palpitations and leg swelling.  Gastrointestinal: Negative for abdominal pain.  Genitourinary: Negative for dysuria.       Objective:   Physical Exam  Constitutional: Vital signs are normal. She appears well-developed and well-nourished. She is cooperative.  Non-toxic appearance. She does not appear ill. No distress.  HENT:  Head: Normocephalic.  Right Ear: Hearing, tympanic membrane, external ear and ear canal normal. Tympanic membrane is not erythematous, not retracted and not bulging.  Left Ear: Hearing, tympanic membrane, external ear and ear canal normal. Tympanic membrane is not erythematous, not retracted and not bulging.  Nose: No mucosal edema or rhinorrhea. Right sinus exhibits no maxillary sinus tenderness and no frontal sinus tenderness. Left sinus exhibits no maxillary sinus tenderness and no frontal sinus tenderness.  Mouth/Throat: Uvula is midline, oropharynx is clear and moist and mucous membranes are normal.  Eyes: Conjunctivae, EOM and lids are normal. Pupils are equal,  round, and reactive to light. Lids are everted and swept, no foreign bodies found.  Neck: Trachea normal and normal range of motion. Neck supple. Carotid bruit is not present. No mass and no thyromegaly present.  Cardiovascular: Normal rate, regular rhythm, S1 normal, S2 normal, normal heart sounds, intact distal pulses and normal pulses.  Exam reveals no gallop and no friction rub.   No murmur heard. Pulmonary/Chest: Effort normal and breath sounds normal. Not tachypneic. No respiratory distress. She has no decreased breath sounds. She has no wheezes. She has no rhonchi. She has no rales.  Abdominal: Soft. Normal appearance and bowel sounds are normal. There is no tenderness.  Musculoskeletal:       Lumbar back: She exhibits decreased range of motion, tenderness and bony tenderness.  Neurological: She is alert. She has normal strength. She displays no tremor. No sensory deficit. She exhibits normal muscle tone. She displays a negative Romberg sign. Gait normal.  Skin: Skin is warm, dry and intact. No rash noted.  Psychiatric: Her speech is normal and behavior is normal. Judgment and thought content normal. Her mood appears not anxious. Cognition and memory are normal. She does not exhibit a depressed mood.          Assessment & Plan:

## 2013-11-27 NOTE — Progress Notes (Signed)
Pre visit review using our clinic review tool, if applicable. No additional management support is needed unless otherwise documented below in the visit note. 

## 2013-11-27 NOTE — Assessment & Plan Note (Signed)
No sign of cellulitis, edema, DVT etc.  Most likely neuropathic pain from her severe chronic back issues.Agree with gabapentin . If twice daily not helping trial of TID.

## 2013-12-06 ENCOUNTER — Observation Stay: Payer: Self-pay | Admitting: Internal Medicine

## 2013-12-06 DIAGNOSIS — I359 Nonrheumatic aortic valve disorder, unspecified: Secondary | ICD-10-CM

## 2013-12-06 LAB — CK-MB
CK-MB: 4.3 ng/mL — ABNORMAL HIGH (ref 0.5–3.6)
CK-MB: 3.5 ng/mL (ref 0.5–3.6)

## 2013-12-06 LAB — BASIC METABOLIC PANEL
Anion Gap: 7 (ref 7–16)
BUN: 20 mg/dL — ABNORMAL HIGH (ref 7–18)
CO2: 30 mmol/L (ref 21–32)
CREATININE: 1.18 mg/dL (ref 0.60–1.30)
Calcium, Total: 9 mg/dL (ref 8.5–10.1)
Chloride: 100 mmol/L (ref 98–107)
EGFR (African American): 50 — ABNORMAL LOW
EGFR (Non-African Amer.): 44 — ABNORMAL LOW
Glucose: 103 mg/dL — ABNORMAL HIGH (ref 65–99)
OSMOLALITY: 277 (ref 275–301)
POTASSIUM: 3.4 mmol/L — AB (ref 3.5–5.1)
SODIUM: 137 mmol/L (ref 136–145)

## 2013-12-06 LAB — MAGNESIUM: Magnesium: 1.6 mg/dL — ABNORMAL LOW

## 2013-12-06 LAB — CBC
HCT: 34.8 % — ABNORMAL LOW (ref 35.0–47.0)
HGB: 11.6 g/dL — ABNORMAL LOW (ref 12.0–16.0)
MCH: 30.7 pg (ref 26.0–34.0)
MCHC: 33.2 g/dL (ref 32.0–36.0)
MCV: 92 fL (ref 80–100)
Platelet: 190 10*3/uL (ref 150–440)
RBC: 3.77 10*6/uL — AB (ref 3.80–5.20)
RDW: 13 % (ref 11.5–14.5)
WBC: 8.1 10*3/uL (ref 3.6–11.0)

## 2013-12-06 LAB — TSH: THYROID STIMULATING HORM: 2.16 u[IU]/mL

## 2013-12-06 LAB — TROPONIN I
Troponin-I: <0.02 ng/mL
Troponin-I: <0.02 ng/mL

## 2013-12-07 LAB — BASIC METABOLIC PANEL
ANION GAP: 6 — AB (ref 7–16)
BUN: 16 mg/dL (ref 7–18)
CALCIUM: 9.6 mg/dL (ref 8.5–10.1)
CO2: 29 mmol/L (ref 21–32)
Chloride: 103 mmol/L (ref 98–107)
Creatinine: 0.83 mg/dL (ref 0.60–1.30)
Glucose: 114 mg/dL — ABNORMAL HIGH (ref 65–99)
Osmolality: 278 (ref 275–301)
POTASSIUM: 3.4 mmol/L — AB (ref 3.5–5.1)
Sodium: 138 mmol/L (ref 136–145)

## 2013-12-07 LAB — MAGNESIUM: Magnesium: 1.5 mg/dL — ABNORMAL LOW

## 2013-12-10 ENCOUNTER — Emergency Department: Payer: Self-pay | Admitting: Emergency Medicine

## 2013-12-10 LAB — CBC
HCT: 34.1 % — ABNORMAL LOW (ref 35.0–47.0)
HGB: 11.1 g/dL — ABNORMAL LOW (ref 12.0–16.0)
MCH: 30.5 pg (ref 26.0–34.0)
MCHC: 32.5 g/dL (ref 32.0–36.0)
MCV: 94 fL (ref 80–100)
PLATELETS: 181 10*3/uL (ref 150–440)
RBC: 3.63 10*6/uL — ABNORMAL LOW (ref 3.80–5.20)
RDW: 13.2 % (ref 11.5–14.5)
WBC: 9.2 10*3/uL (ref 3.6–11.0)

## 2013-12-10 LAB — URINALYSIS, COMPLETE
BILIRUBIN, UR: NEGATIVE
BLOOD: NEGATIVE
Bacteria: NONE SEEN
GLUCOSE, UR: NEGATIVE mg/dL (ref 0–75)
Ketone: NEGATIVE
LEUKOCYTE ESTERASE: NEGATIVE
Nitrite: NEGATIVE
PROTEIN: NEGATIVE
Ph: 5 (ref 4.5–8.0)
RBC,UR: <1 /HPF (ref 0–5)
SPECIFIC GRAVITY: 1.013 (ref 1.003–1.030)
Squamous Epithelial: <1

## 2013-12-10 LAB — BASIC METABOLIC PANEL
Anion Gap: 4 — ABNORMAL LOW (ref 7–16)
BUN: 35 mg/dL — ABNORMAL HIGH (ref 7–18)
Calcium, Total: 9 mg/dL (ref 8.5–10.1)
Chloride: 100 mmol/L (ref 98–107)
Co2: 33 mmol/L — ABNORMAL HIGH (ref 21–32)
Creatinine: 1.1 mg/dL (ref 0.60–1.30)
EGFR (Non-African Amer.): 47 — ABNORMAL LOW
GFR CALC AF AMER: 55 — AB
Glucose: 92 mg/dL (ref 65–99)
Osmolality: 281 (ref 275–301)
POTASSIUM: 3.6 mmol/L (ref 3.5–5.1)
Sodium: 137 mmol/L (ref 136–145)

## 2013-12-10 LAB — TROPONIN I: Troponin-I: <0.02 ng/mL

## 2013-12-10 LAB — PRO B NATRIURETIC PEPTIDE: B-TYPE NATIURETIC PEPTID: 75 pg/mL (ref 0–450)

## 2013-12-16 ENCOUNTER — Encounter: Payer: Self-pay | Admitting: Family Medicine

## 2013-12-16 ENCOUNTER — Ambulatory Visit (INDEPENDENT_AMBULATORY_CARE_PROVIDER_SITE_OTHER): Payer: Medicare Other | Admitting: Family Medicine

## 2013-12-16 VITALS — BP 85/55 | HR 53 | Temp 98.6°F | Ht 60.0 in | Wt 137.8 lb

## 2013-12-16 DIAGNOSIS — J449 Chronic obstructive pulmonary disease, unspecified: Secondary | ICD-10-CM

## 2013-12-16 DIAGNOSIS — I480 Paroxysmal atrial fibrillation: Secondary | ICD-10-CM

## 2013-12-16 DIAGNOSIS — I959 Hypotension, unspecified: Secondary | ICD-10-CM

## 2013-12-16 DIAGNOSIS — I4891 Unspecified atrial fibrillation: Secondary | ICD-10-CM

## 2013-12-16 DIAGNOSIS — I2510 Atherosclerotic heart disease of native coronary artery without angina pectoris: Secondary | ICD-10-CM

## 2013-12-16 HISTORY — DX: Paroxysmal atrial fibrillation: I48.0

## 2013-12-16 NOTE — Progress Notes (Signed)
Pre visit review using our clinic review tool, if applicable. No additional management support is needed unless otherwise documented below in the visit note. 

## 2013-12-16 NOTE — Patient Instructions (Signed)
Stop your HYDROCHLOROTHIAZIDE. Blood pressure medicine.

## 2013-12-16 NOTE — Progress Notes (Signed)
Dr. Karleen Hampshire T. Cyenna Rebello, MD, CAQ [facility] [address] [place], [address] Phone: [phone] Fax: [phone]  12/16/2013  Patient: [name], MRN: [id number], DOB: 1931-10-09, 80 y.o.  Primary Physician:  Hannah Beat, MD  Chief Complaint: Hospitalization Follow-up  Subjective:   [name] is a 80 y.o. very pleasant female patient who presents with the following:  Date of Admission: 12/06/2013  Date of Discharge: 12/07/2013 Cardiologist: Shirlee Latch  Patient went to the emergency room, she was not feeling well, while there she was determined to be in atrial fibrillation for some time, and then she converted to normal sinus rhythm on her own. She was also found to have low levels of potassium as well as magnesium. She was admitted overnight, ruled out for an MI, and placed on low-dose beta blocker which maintained her sinus rhythm. She had no additional episodes of atrial fibrillation after her conversion.  She also was found to have potentially a sinus infection while she was in the hospital with sinus pressure and congestion, and they placed her on some Levaquin.  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 Metoprolol 25 mg (Toprol XL) once a day.   Past Medical History, Surgical History, Social History, Family History, Problem List, Medications, and Allergies have been reviewed and updated if relevant.   GEN: as above no fevers, chills. GI: No n/v/d, eating normally Pulm: No SOB Interactive and getting along well at home.  Otherwise, ROS is as per the HPI.  Objective:   BP 85/55  Pulse 53  Temp(Src) 98.6 F (37 C) (Oral)  Ht 5' (1.524 m)  Wt 137 lb 12 oz (62.483 kg)  BMI 26.90 kg/m2  SpO2 95%  GEN: WDWN, NAD, Non-toxic, A & O x 3 HEENT: Atraumatic, Normocephalic. Neck supple. No masses, No LAD. Ears and Nose: No external deformity. CV: RRR, No M/G/R. No JVD. No thrill. No extra heart  sounds. PULM: CTA B, no wheezes, crackles, rhonchi. No retractions. No resp. distress. No accessory muscle use. EXTR: No c/c/e NEURO Normal gait.  PSYCH: Normally interactive. Conversant. Not depressed or anxious appearing.  Calm demeanor.   Laboratory and Imaging Data: [facility] data reviewed.  Assessment and Plan:   PAF (paroxysmal atrial fibrillation)  COPD  Hypotension, unspecified  Lone episode of AF. Conversation with the patient about this. Patient converted on her own. She is now maintaining her rhythm on low-dose beta blocker. She has had 2 almost fatal GI bleeds in the recent past. She is highly reluctant and is not really open to starting anticoagulation at this point. She is open the maintaining taking aspirin daily. She also has some additional risks including high-dose narcotic use and use of a cane for walking. She discussed the potential risks and benefits versus stroke versus potential bleeding and GI bleeding, and she understands that she may be at high risk for potential stroke, but would like to proceed with aspirin alone.  Overall, she looks really good today.   Low BP, stop HCTZ.  Cc: Dr. Shirlee Latch  Signed,  Elpidio Galea. Dyllan Hughett, MD   Patient's Medications  New Prescriptions   No medications on file  Previous Medications   ALBUTEROL (PROVENTIL HFA;VENTOLIN HFA) 108 (90 BASE) MCG/ACT INHALER    Inhale 2 puffs into the lungs every 6 (six) hours as needed for wheezing or shortness of breath.   ALBUTEROL (PROVENTIL) (5 MG/ML) 0.5% NEBULIZER SOLUTION    Take 0.5  mLs (2.5 mg total) by nebulization every 6 (six) hours as needed for wheezing.   ASPIRIN EC 81 MG TABLET    Take 1 tablet (81 mg total) by mouth daily.   ATORVASTATIN (LIPITOR) 40 MG TABLET    Take 1 tablet (40 mg total) by mouth daily.   FLUTICASONE-SALMETEROL (ADVAIR DISKUS) 500-50 MCG/DOSE AEPB    Inhale 1 puff into the lungs 2 (two) times daily.   GABAPENTIN (NEURONTIN) 300 MG CAPSULE    Take 300 mg by mouth  2 (two) times daily.   HYDROCHLOROTHIAZIDE (HYDRODIURIL) 25 MG TABLET    Take 1 tablet (25 mg total) by mouth daily.   MAGNESIUM OXIDE (MAG-OX) 400 MG TABLET    Take 400 mg by mouth daily.   METOPROLOL SUCCINATE (TOPROL-XL) 25 MG 24 HR TABLET       MORPHINE (KADIAN) 30 MG 24 HR CAPSULE    Take 30 mg by mouth every 12 (twelve) hours.   NITROGLYCERIN (NITROSTAT) 0.3 MG SL TABLET    Place 1 tablet (0.3 mg total) under the tongue every 5 (five) minutes as needed for chest pain. Max 3 per day   OMEGA-3 FATTY ACIDS (FISH OIL PO)    Take 600 mg by mouth daily.   OXYCODONE (ROXICODONE) 15 MG IMMEDIATE RELEASE TABLET    Take 15 mg by mouth every 4 (four) hours as needed for pain.    POTASSIUM CHLORIDE SA (K-DUR,KLOR-CON) 20 MEQ TABLET    Take 1 tablet (20 mEq total) by mouth daily.   PSYLLIUM ([employer] FIBER) 0.52 G CAPSULE    Take 0.52 g by mouth daily.   RAMIPRIL (ALTACE) 10 MG CAPSULE    Take 1 capsule (10 mg total) by mouth daily.   TIOTROPIUM (SPIRIVA HANDIHALER) 18 MCG INHALATION CAPSULE    Place 1 capsule (18 mcg total) into inhaler and inhale daily.  Modified Medications   No medications on file  Discontinued Medications   No medications on file

## 2013-12-17 NOTE — Progress Notes (Signed)
[name], [name] will need a followup given recent atrial fibrillation diagnosis.  If need be, can be with PA on day I am in office.

## 2013-12-23 ENCOUNTER — Ambulatory Visit: Payer: PRIVATE HEALTH INSURANCE | Admitting: Physician Assistant

## 2013-12-24 ENCOUNTER — Encounter: Payer: Self-pay | Admitting: Nurse Practitioner

## 2013-12-24 ENCOUNTER — Ambulatory Visit (INDEPENDENT_AMBULATORY_CARE_PROVIDER_SITE_OTHER): Payer: Medicare Other | Admitting: Nurse Practitioner

## 2013-12-24 VITALS — BP 136/70 | HR 81 | Ht 60.0 in | Wt 139.4 lb

## 2013-12-24 DIAGNOSIS — I2510 Atherosclerotic heart disease of native coronary artery without angina pectoris: Secondary | ICD-10-CM

## 2013-12-24 DIAGNOSIS — I480 Paroxysmal atrial fibrillation: Secondary | ICD-10-CM

## 2013-12-24 DIAGNOSIS — I4891 Unspecified atrial fibrillation: Secondary | ICD-10-CM

## 2013-12-24 NOTE — Patient Instructions (Addendum)
We will be checking the following labs today BMET, CBC and TSH.  Sign a release for the echo from [facility] from September.  Stay on your current medicines  See Dr. Shirlee Latch as planned in November  Call the [facility] office at [phone] if you have any questions, problems or concerns.

## 2013-12-24 NOTE — Progress Notes (Addendum)
[name] Date of Birth: 1931-06-07 Medical Record #[id number]  History of Present Illness: Ms. Ryks is seen back today for a follow up visit. Seen for Dr. Shirlee Latch. She has a history of COPD, HTN, diabetes, and CAD. Patient was admitted to [facility] with chest pain in 8/12. Troponin was mildly elevated. Left heart cath showed severe, diffuse disease in a small RCA that was not amenable to intervention. This was the likely culprit vessel. She also had a 70% LAD stenosis. [name] was then done to assess for ischemia in the LAD distribution. This showed only a fixed apical perfusion defect with no ischemia (low risk). She was admitted to [facility] in 1/13 with a lower GI bleed that was thought to be diverticular. She required blood transfusion. Due to ongoing episodes of chest pain, I sent her for LHC in 10/13, and she ended up having CABG x 4. She did well post-op and was sent to a nursing home. She was, however, readmitted in 11/13 with acute on chronic diastolic CHF and an an acute exacerbation of COPD. She was diuresed and treated with antibiotics. Last echo in 5/14 showed EF 55-60%.   She has had recurrent lower GI bleeds from diverticulosis back in 2013. No recent rectal bleeding.   She was last seen here at the end of July. Was doing ok.   Comes back today. Here alone. She has been back to [facility] earlier this month - looks like she had lone episode of PAF - converted on her own. Sounds like it was triggered by a bout of bronchitis - now resolved. Now on metoprolol. Off HCTZ. On potassium. Says she had an echo but no results yet. She notes that she was just "not feeling well". Could not really explain. Says she was given some IV medicine and then everything "was ok". Sounds like IV diltiazem. She is doing ok. She is mostly limited by her back - wants to have the new surgery advertised on TV. Wanting to stand up straight. No palpitations. No chest pain. Only on aspirin and says she will not take  other anticoagulation.   Current Outpatient Prescriptions  Medication Sig Dispense Refill  . albuterol (PROVENTIL HFA;VENTOLIN HFA) 108 (90 BASE) MCG/ACT inhaler Inhale 2 puffs into the lungs every 6 (six) hours as needed for wheezing or shortness of breath.  1 Inhaler  2  . albuterol (PROVENTIL) (5 MG/ML) 0.5% nebulizer solution Take 0.5 mLs (2.5 mg total) by nebulization every 6 (six) hours as needed for wheezing.  20 mL  12  . aspirin EC 81 MG tablet Take 1 tablet (81 mg total) by mouth daily.  90 tablet  3  . atorvastatin (LIPITOR) 40 MG tablet Take 40 mg by mouth every morning.      . Fluticasone-Salmeterol (ADVAIR DISKUS) 500-50 MCG/DOSE AEPB Inhale 1 puff into the lungs 2 (two) times daily.  60 each  11  . gabapentin (NEURONTIN) 300 MG capsule Take 300 mg by mouth 2 (two) times daily. Pt states she is only taking 1 tablet at bedtime (12/24/13)      . ibuprofen (ADVIL,MOTRIN) 200 MG tablet Take 200 mg by mouth daily as needed (pain).      . magnesium oxide (MAG-OX) 400 MG tablet Take 400 mg by mouth at bedtime.       . metoprolol succinate (TOPROL-XL) 25 MG 24 hr tablet Take 25 mg by mouth daily.       [name] morphine (KADIAN) 30 MG 24 hr capsule  Take 30 mg by mouth every 12 (twelve) hours.      . Omega-3 Fatty Acids (FISH OIL) 1000 MG CAPS Take 1 capsule by mouth daily.      [name] oxyCODONE (ROXICODONE) 15 MG immediate release tablet Take 15 mg every 4-6 hours      . potassium chloride SA (K-DUR,KLOR-CON) 20 MEQ tablet Take 1 tablet (20 mEq total) by mouth daily.  90 tablet  3  . psyllium ([employer] FIBER) 0.52 G capsule Take 0.52 g by mouth daily.      . ramipril (ALTACE) 10 MG capsule Take 1 capsule (10 mg total) by mouth daily.  30 capsule  11  . tiotropium (SPIRIVA) 18 MCG inhalation capsule Place 18 mcg into inhaler and inhale 2 (two) times daily. Pt states she is only using as needed for shortness of breath (12/24/13)      . nitroGLYCERIN (NITROSTAT) 0.3 MG SL tablet Place 1 tablet (0.3 mg total)  under the tongue every 5 (five) minutes as needed for chest pain. Max 3 per day  50 tablet  3   No current facility-administered medications for this visit.    Allergies  Allergen Reactions  . Other Diarrhea, Rash and Other (See Comments)    Uncoded Allergy. Allergen: mycins  Reaction: Sore tongue, raw mouth, weakness  . Penicillins Other (See Comments)    Other Reaction: NEAR SYNCOPE  . Sulfa Antibiotics Shortness Of Breath  . Morphine Other (See Comments)    Other Reaction: GI UPSET  . Morphine Sulfate Nausea And Vomiting    At high doses hallucinations Can take in low doses  . Macrolides And Ketolides Diarrhea and Nausea And Vomiting    Only causes GI distress at extremely high doses per patient  . Iron Diarrhea  . Naproxen Rash    Past Medical History  Diagnosis Date  . Hypertension 08/01/1995  . Rheumatoid arthritis(714.0) 04/03/1983  . Osteoarthritis 04/03/1983  . Anemia, deficiency 07/01/2004    (w/u by Dr. Lorre Nick)  . Diabetes mellitus type II 07/31/1997  . Osteoporosis 12/31/2000  . CAD (coronary artery disease) 09/01/2002    a. s/p NSTEMI 2012. b. s/p CABG 01/2012.  [name] Hyperlipemia 07/02/1999  . COPD (chronic obstructive pulmonary disease) 05/31/2004  . diverticulosis of colon   . Arteriovenous malformation of gastrointestinal tract   . Fatty liver   . Chronic back pain   . Splenic artery aneurysm   . Anxiety and depression   . GI bleed     a. 04/2011 felt diverticular requiring microembolization by vascular.  . Adrenal mass     a. Excision 2007.  [name] Restless leg syndrome   . C. difficile colitis     a. 04/2008 = severe.  [name] Hx of CABG 04/18/2012  . Diastolic CHF     a. 02/2012: resp failure with COPD exac/CHF/pleural eff.;  b.  Echocardiogram 08/29/12: EF 55-60%, mild LVH, mild LAE, impaired LV relaxation  . PAF (paroxysmal atrial fibrillation) 12/16/2013    PMH:  1. COPD  2. HTN  3. Diabetes mellitus type II  4. ? AAA: Patient says she was told by Dr.  Hetty Ely that she has an aneurysm. Abdominal US (10/12) showed no AAA.  5. Chronic low back pain  6. Osteoarthritis: hip pain  7. Anemia  8. Excision of adrenal mass in 2007  9. Restless leg syndrome.  10. Bilateral shoulder replacements.  11. CAD: Cath in 2004 with moderate nonobstructive disease. Patient was admitted to [facility] in 8/12 with NSTEMI.  LHC (8/12) showed severe diffuse disease of a small RCA. This was the culprit lesion but it was too small and diffusely diseased for intervention. There was a 70% proximal LAD stenosis. Lexiscan myoview (9/12) to assess for LAD territory ischemia showed a small fixed apical defect, low risk. EF 65% by myoview. CABG 10/13 with LIMA-LAD, SVG-D, SVG-PLOM, and SVG-RCA. Echo (11/13): EF 55-60%, grade I diastolic dysfunction, PA systolic pressure 38 mmHg.  12. Diverticular bleed 1/13 requiring microembolization by vascular surgery. Recurrent diverticular bleeding in 5/14.  13. ABIs (2/13) within normal limits.  14. Chronic diastolic CHF: Last echo (5/14) with EF 55-60%, mild LVH.    Past Surgical History  Procedure Laterality Date  . Nasal sinus surgery    . Inguinal hernia repair    . Partial hysterectomy      ovaries intact, dysmennorhea w/ A/P repari  . Carpal tunnel release      bilat  . Trigger finger release      right  . Wrist surgery      neuroma repair, right  . Ankle surgery      neuroma repair, right ORIF  . Total shoulder replacement  12/13/1998    right, ([place])  . Rotator cuff repair  11/22/2003    ([place]) left  . Epidural block injection  05/2005    x 3  . Long finger tendon realignment  10/31/2005    [place]  . Lumbar epidural  06/13/2006  . Laminectomy  06/2006    L4/5 spinal stenosis (Dr. [name])  . Laminectomy  02/24/2008    L3/4 Ant/Lat interbody fusion and Lat Artthrodesis w/plate using XLIF (Dr. [name])  . Colonic embolization  04/18/2011    Dr. Wyn Quaker, acute GI bleed  . Spinal cord stimulator implant    . Coronary  artery bypass graft  01/22/2012    Procedure: CORONARY ARTERY BYPASS GRAFTING (CABG);  Surgeon: Delight Ovens, MD;  Location: [facility] OR;  Service: Open Heart Surgery;  Laterality: N/A;  Coronary Artery Bypass Graft times four utilizing the left internal mammary artery and the left greater saphenous vein harvested endoscopically.  Rhae Hammock without cardioversion  01/22/2012    Procedure: TRANSESOPHAGEAL ECHOCARDIOGRAM (TEE);  Surgeon: Delight Ovens, MD;  Location: [facility] OR;  Service: Open Heart Surgery;  Laterality: N/A;    History  Smoking status  . Former Smoker -- 0.10 packs/day for 50 years  . Types: Cigarettes  . Quit date: 08/01/2012  Smokeless tobacco  . Former User    History  Alcohol Use  . Yes    Comment: red wine occassionally    Family History  Problem Relation Age of Onset  . Hypotension Mother   . Dementia Mother   . Alcohol abuse Father   . Heart disease Father     MI, enlarged heart  . Diabetes Brother     Review of Systems: The review of systems is per the HPI.  All other systems were reviewed and are negative.  Physical Exam: BP 136/70  Pulse 81  Ht 5' (1.524 m)  Wt 139 lb 6.4 oz (63.231 kg)  BMI 27.22 kg/m2  SpO2 92% Patient is very pleasant and in no acute distress. Skin is warm and dry. Color is normal.  HEENT is unremarkable. Normocephalic/atraumatic. PERRL. Sclera are nonicteric. Neck is supple. No masses. No JVD. Lungs are clear. Cardiac exam shows a regular rate and rhythm. Abdomen is soft. Extremities are without edema. Gait and ROM are intact. No gross neurologic deficits noted.  Wt Readings from Last 3 Encounters:  12/24/13 139 lb 6.4 oz (63.231 kg)  12/16/13 137 lb 12 oz (62.483 kg)  11/27/13 137 lb (62.143 kg)    LABORATORY DATA/PROCEDURES: EKG today shows sinus rhythm.  Lab Results  Component Value Date   WBC 9.1 06/11/2013   HGB 11.3* 06/11/2013   HCT 33.8* 06/11/2013   PLT 189.0 06/11/2013   GLUCOSE 102* 11/09/2013   CHOL 117 10/23/2013    TRIG 59.0 10/23/2013   HDL 31.30* 10/23/2013   LDLCALC 74 10/23/2013   ALT 13 10/23/2013   AST 18 10/23/2013   NA 138 11/09/2013   K 3.7 11/09/2013   CL 102 11/09/2013   CREATININE 1.2 11/09/2013   BUN 25* 11/09/2013   CO2 26 11/09/2013   TSH 1.28 02/08/2011   INR 1.52* 01/22/2012   HGBA1C 5.7* 01/18/2012   MICROALBUR 0.5 07/19/2011    BNP (last 3 results)  Recent Labs  06/11/13 1253  PROBNP 338.0*     Assessment / Plan: 1. PAF - lone episode - in sinus. She is not interested in anticoagulation other than her aspirin. Will get release for records and the echo. See back as planned. Recheck potassium and MG today.   2. CAD -  No active symptoms  3. Diastolic HF - compensated at this time  4. Back pain - this is her most limiting factor - wanting to get back to the pain clinic in [place] rather than drive to [place] - she will discuss with Dr. Patsy Lager.   See back in November as planned. If she has recurrent issues, may need to consider AAD therapy.   Patient is agreeable to this plan and will call if any problems develop in the interim.   Rosalio Macadamia, RN, ANP-C [facility] [address] [place], [place]  [address] [phone]  Addendum:  Echo from [facility] has been scanned into EPIC - EF normal. Mild LAE noted.

## 2013-12-25 LAB — MAGNESIUM: Magnesium: 1.8 mg/dL (ref 1.5–2.5)

## 2013-12-25 LAB — BASIC METABOLIC PANEL
BUN: 25 mg/dL — ABNORMAL HIGH (ref 6–23)
CO2: 30 mEq/L (ref 19–32)
Calcium: 9.3 mg/dL (ref 8.4–10.5)
Chloride: 105 mEq/L (ref 96–112)
Creatinine, Ser: 1 mg/dL (ref 0.4–1.2)
GFR: 57.35 mL/min — ABNORMAL LOW (ref >60.00)
Glucose, Bld: 111 mg/dL — ABNORMAL HIGH (ref 70–99)
Potassium: 4.1 mEq/L (ref 3.5–5.1)
Sodium: 140 mEq/L (ref 135–145)

## 2013-12-28 ENCOUNTER — Encounter: Payer: Self-pay | Admitting: Cardiovascular Disease

## 2014-01-08 ENCOUNTER — Ambulatory Visit (INDEPENDENT_AMBULATORY_CARE_PROVIDER_SITE_OTHER): Payer: Medicare Other | Admitting: Family Medicine

## 2014-01-08 ENCOUNTER — Encounter: Payer: Self-pay | Admitting: Family Medicine

## 2014-01-08 VITALS — BP 194/104 | HR 77 | Temp 98.2°F | Ht 60.0 in | Wt 131.5 lb

## 2014-01-08 DIAGNOSIS — I2510 Atherosclerotic heart disease of native coronary artery without angina pectoris: Secondary | ICD-10-CM

## 2014-01-08 DIAGNOSIS — I10 Essential (primary) hypertension: Secondary | ICD-10-CM

## 2014-01-08 DIAGNOSIS — R21 Rash and other nonspecific skin eruption: Secondary | ICD-10-CM

## 2014-01-08 MED ORDER — MUPIROCIN 2 % EX OINT: 1.0000 "application " | TOPICAL_OINTMENT | Freq: Two times a day (BID) | CUTANEOUS | Status: DC

## 2014-01-08 NOTE — Progress Notes (Signed)
   Subjective:    Patient ID: [name], female    DOB: [birth year], 80 y.o.   MRN: [id number]  HPI  80 year old female presents for  new onset rash on lower abdomen noted first 6 days ago.  Has not spread, is in 2 separate area on lower abdomen.  ? Spider bite, never saw an insect. DID not feel a bite.  Has not applied any treatment. No discharge, there is some odor.  Are in not itchy, no painful.  No fever. No flu like symptoms.  Her BP is very high today. Took ramipril and metoprolol 1 hour prior to OV. She has paroxysmal afib, on aspirin.  Rate controlled on metoprolol. HR 77 She does not check BP at home. BP Readings from Last 3 Encounters:  01/08/14 194/104  12/24/13 136/70  12/16/13 85/55       Review of Systems  Constitutional: Negative for fever and fatigue.  HENT: Negative for ear pain.   Eyes: Negative for pain.  Respiratory: Negative for chest tightness and shortness of breath.   Cardiovascular: Negative for chest pain, palpitations and leg swelling.  Gastrointestinal: Negative for abdominal pain.  Genitourinary: Negative for dysuria.       Objective:   Physical Exam  Constitutional:  Elderly kyphotic female in NAD  HENT:  Mouth/Throat: Oropharynx is clear and moist.  Eyes: Pupils are equal, round, and reactive to light.  Neck: Normal range of motion. Neck supple.  Cardiovascular: Normal rate and regular rhythm.   Pulmonary/Chest: She has no decreased breath sounds. She has no wheezes. She has no rhonchi. She has no rales.  Skin:     2 areas of dark colored dead skin overlying ( appears like dried out blisters) below 2 areas of dry pink/red well circumscribed lesions, foul odor           Assessment & Plan:

## 2014-01-08 NOTE — Assessment & Plan Note (Addendum)
No clear insect bite. ? Yeast vs bacterial ( impetigo) infection but much drier than both expected.  Has eczema like appearance to it in part. No known new exposures triggering dermatitis.  Given odor will treat with topical antibiotics which will also provide moisture. If not improving consider antifungal/ steroid combination cream.

## 2014-01-08 NOTE — Progress Notes (Signed)
Pre visit review using our clinic review tool, if applicable. No additional management support is needed unless otherwise documented below in the visit note. 

## 2014-01-08 NOTE — Patient Instructions (Addendum)
Apply antibiotic ointment to rash . Call if redness spreading, or fever. Follow up in 1 week with [name]

## 2014-01-08 NOTE — Addendum Note (Signed)
Addended by: Kerby Nora E on: 01/08/2014 12:23 PM   Modules accepted: Level of Service

## 2014-01-08 NOTE — Assessment & Plan Note (Signed)
Elevated in office. Usually well controlled. Have pt follow at home call if > 150/90 consistently. Recheck at next appt.

## 2014-01-11 ENCOUNTER — Telehealth: Payer: Self-pay | Admitting: Family Medicine

## 2014-01-11 NOTE — Telephone Encounter (Signed)
emmi mailed  °

## 2014-01-15 ENCOUNTER — Encounter: Payer: Self-pay | Admitting: Family Medicine

## 2014-01-15 ENCOUNTER — Ambulatory Visit (INDEPENDENT_AMBULATORY_CARE_PROVIDER_SITE_OTHER): Payer: Medicare Other | Admitting: Family Medicine

## 2014-01-15 VITALS — BP 140/70 | HR 85 | Temp 98.2°F | Ht 60.0 in | Wt 135.2 lb

## 2014-01-15 DIAGNOSIS — I2510 Atherosclerotic heart disease of native coronary artery without angina pectoris: Secondary | ICD-10-CM

## 2014-01-15 DIAGNOSIS — R21 Rash and other nonspecific skin eruption: Secondary | ICD-10-CM

## 2014-01-15 NOTE — Progress Notes (Signed)
Pre visit review using our clinic review tool, if applicable. No additional management support is needed unless otherwise documented below in the visit note. 

## 2014-01-15 NOTE — Progress Notes (Signed)
   Subjective:    Patient ID: [name], female    DOB: September 01, 1931, 80 y.o.   MRN: [id number]  HPI 80 year old female presetns for follow up celelultitis. Improving rash with mupirocin ointment.  Scab areas flaked off.  no fever.  Pt feels well.   Review of Systems  Constitutional: Negative for fever and fatigue.  HENT: Negative for ear pain.   Eyes: Negative for pain.  Respiratory: Negative for chest tightness and shortness of breath.   Cardiovascular: Negative for chest pain, palpitations and leg swelling.  Gastrointestinal: Negative for abdominal pain.  Genitourinary: Negative for dysuria.       Objective:   Physical Exam  Constitutional:  Elderly kyphotic female in NAD  HENT:  Mouth/Throat: Oropharynx is clear and moist.  Eyes: Pupils are equal, round, and reactive to light.  Neck: Normal range of motion. Neck supple.  Cardiovascular: Normal rate and regular rhythm.   Pulmonary/Chest: She has no decreased breath sounds. She has no wheezes. She has no rhonchi. She has no rales.  Skin:     2 areas of dark colored dead skin overlying have now flaked off. 2 areas of dry pink well circumscribed lesions,no odor.          Assessment & Plan:

## 2014-01-15 NOTE — Assessment & Plan Note (Signed)
Improving with current treatment plan. Can also add hydrocortisone for itching.

## 2014-02-03 ENCOUNTER — Emergency Department: Payer: Self-pay | Admitting: Emergency Medicine

## 2014-02-03 LAB — COMPREHENSIVE METABOLIC PANEL
ALT: 17 U/L
AST: 23 U/L (ref 15–37)
Albumin: 3.3 g/dL — ABNORMAL LOW (ref 3.4–5.0)
Alkaline Phosphatase: 59 U/L
Anion Gap: 7 (ref 7–16)
BUN: 21 mg/dL — ABNORMAL HIGH (ref 7–18)
Bilirubin,Total: 0.3 mg/dL (ref 0.2–1.0)
CALCIUM: 8.6 mg/dL (ref 8.5–10.1)
CREATININE: 0.84 mg/dL (ref 0.60–1.30)
Chloride: 104 mmol/L (ref 98–107)
Co2: 29 mmol/L (ref 21–32)
EGFR (Non-African Amer.): >60
Glucose: 156 mg/dL — ABNORMAL HIGH (ref 65–99)
OSMOLALITY: 286 (ref 275–301)
Potassium: 4.2 mmol/L (ref 3.5–5.1)
Sodium: 140 mmol/L (ref 136–145)
TOTAL PROTEIN: 7.3 g/dL (ref 6.4–8.2)

## 2014-02-03 LAB — CBC
HCT: 35.3 % (ref 35.0–47.0)
HGB: 11.3 g/dL — ABNORMAL LOW (ref 12.0–16.0)
MCH: 29.5 pg (ref 26.0–34.0)
MCHC: 32.1 g/dL (ref 32.0–36.0)
MCV: 92 fL (ref 80–100)
PLATELETS: 212 10*3/uL (ref 150–440)
RBC: 3.84 10*6/uL (ref 3.80–5.20)
RDW: 13.7 % (ref 11.5–14.5)
WBC: 7.2 10*3/uL (ref 3.6–11.0)

## 2014-02-03 LAB — TROPONIN I: Troponin-I: <0.02 ng/mL

## 2014-02-08 ENCOUNTER — Encounter: Payer: Self-pay | Admitting: Family Medicine

## 2014-02-08 ENCOUNTER — Ambulatory Visit (INDEPENDENT_AMBULATORY_CARE_PROVIDER_SITE_OTHER)
Admission: RE | Admit: 2014-02-08 | Discharge: 2014-02-08 | Disposition: A | Discharge status: Home / Self Care | Payer: Medicare Other | Source: Ambulatory Visit | Attending: Family Medicine | Admitting: Family Medicine

## 2014-02-08 ENCOUNTER — Ambulatory Visit (INDEPENDENT_AMBULATORY_CARE_PROVIDER_SITE_OTHER): Payer: Medicare Other | Admitting: Family Medicine

## 2014-02-08 VITALS — BP 122/66 | HR 79 | Temp 98.5°F | Ht 60.0 in | Wt 132.8 lb

## 2014-02-08 DIAGNOSIS — Z96611 Presence of right artificial shoulder joint: Secondary | ICD-10-CM

## 2014-02-08 DIAGNOSIS — Z96612 Presence of left artificial shoulder joint: Secondary | ICD-10-CM

## 2014-02-08 DIAGNOSIS — M25511 Pain in right shoulder: Secondary | ICD-10-CM

## 2014-02-08 DIAGNOSIS — J438 Other emphysema: Secondary | ICD-10-CM

## 2014-02-08 DIAGNOSIS — I2510 Atherosclerotic heart disease of native coronary artery without angina pectoris: Secondary | ICD-10-CM

## 2014-02-08 DIAGNOSIS — J441 Chronic obstructive pulmonary disease with (acute) exacerbation: Secondary | ICD-10-CM

## 2014-02-08 MED ORDER — FLUTICASONE-SALMETEROL 500-50 MCG/DOSE IN AEPB: 1.0000 | INHALATION_SPRAY | Freq: Two times a day (BID) | RESPIRATORY_TRACT | Status: DC

## 2014-02-08 MED ORDER — TIOTROPIUM BROMIDE MONOHYDRATE 18 MCG IN CAPS: 18.0000 ug | ORAL_CAPSULE | Freq: Once | RESPIRATORY_TRACT | Status: DC

## 2014-02-08 NOTE — Progress Notes (Signed)
Dr. Karleen Hampshire T. Devaunte Gasparini, MD, CAQ Sports Medicine [facility] [address] [place], [address] Phone: [phone] Fax: [phone]  02/08/2014  Patient: [name], MRN: [id number], DOB: 07/02/1931, 80 y.o.  Primary Physician:  Hannah Beat, MD  Chief Complaint: Follow-up and Shoulder Pain  Subjective:   [name] is a 80 y.o. very pleasant female patient who presents with the following:  F/u COPD, hosp: Recent ER visit was reviewed, where the patient was seen with a chronic obstructive pulmonary disease exacerbation and was placed on prednisone.  She has finished her prednisone now, and she is feeling much much better.  At this point, the patient actually has run out of her Advair and she has stopped also taking her Spiriva.  Previously, we had the patient on oxygen, and she is no longer taking this either.  Currently, the patient is feeling pretty good.  She is still having some sputum production.  R shoulder: She is still frustrated from a pain management standpoint, and she is still having a lot of pain in both of her shoulders, more on the RIGHT.  She is fairly limited on the RIGHT shoulder which is post shoulder replacement.  She is actually had bilateral shoulder replacement.  Her motion is quite limited, and she has pain essentially every day with this.  Past Medical History, Surgical History, Social History, Family History, Problem List, Medications, and Allergies have been reviewed and updated if relevant.   GEN: as above GI: No n/v/d, eating normally Pulm: Nas above Interactive and getting along well at home.  Otherwise, ROS is as per the HPI.  Objective:   BP 122/66 mmHg  Pulse 79  Temp(Src) 98.5 F (36.9 C) (Oral)  Ht 5' (1.524 m)  Wt 132 lb 12 oz (60.215 kg)  BMI 25.93 kg/m2  SpO2 95%  GEN: WDWN, NAD, Non-toxic, A & O x 3 HEENT: Atraumatic, Normocephalic. Neck supple. No masses, No LAD. Ears and Nose: No external  deformity. CV: RRR, No M/G/R. No JVD. No thrill. No extra heart sounds. PULM: CTA B, no wheezes, crackles, rhonchi. No retractions. No resp. distress. No accessory muscle use. EXTR: No c/c/e NEURO Normal gait.  PSYCH: Normally interactive. Conversant. Not depressed or anxious appearing.  Calm demeanor.   On the RIGHT shoulder, nontender along the clavicle.  She is markedly tender at the acromioclavicular joint.  The distal clavicle is more prominent on the RIGHT compared to the LEFT.  Left-sided acromioclavicular joint is nontender.  She is fairly limited from motion standpoint on the RIGHT.  She can only abduct her shoulder to about 100 and flexion to 95-100.  Minimal internal range of motion.  All these cause her pain.  Strength is 4+/5 in all directions.  Laboratory and Imaging Data:  all hospital records reviewed  Dg Shoulder Right  02/09/2014   CLINICAL DATA:  Significant pain status post right shoulder joint replacement  EXAM: RIGHT SHOULDER - 2+ VIEW  COMPARISON:  None.  FINDINGS: A right shoulder joint prosthesis is in place. The interface of the prosthetic components with the native bone is normal. No dislocation is evident. The soft tissues are unremarkable.  IMPRESSION: No acute abnormality of the prosthetic right shoulder joint is demonstrated.   Electronically Signed   By: David  [place]   On: 02/09/2014 07:44     Assessment and Plan:   Right shoulder pain - Plan: DG Shoulder Right  COPD exacerbation  Other emphysema  Status  post replacement of both shoulder joints  Resume Advair and Spiriv. Recheck in 1 month  Pain in RIGHT shoulder is more problematic, because I do not think there is much that can be done for the patient.  She may have at some point destabilized her [facility] joint with her 3 different shoulder surgeries or with a trauma.  Her films looked stable.  She is not stable enough from a medical standpoint to have a revision operation.  I think that she understands  this.  Challenging.  Follow-up: 1 mo  New Prescriptions   No medications on file   Orders Placed This Encounter  Procedures  . DG Shoulder Right    Signed,  Karleen Hampshire T. Evony Rezek, MD   Patient's Medications  New Prescriptions   No medications on file  Previous Medications   ALBUTEROL (PROAIR HFA) 108 (90 BASE) MCG/ACT INHALER    Inhale 2 puffs into the lungs 4 (four) times daily.   ALBUTEROL (PROVENTIL) (5 MG/ML) 0.5% NEBULIZER SOLUTION    Take 0.5 mLs (2.5 mg total) by nebulization every 6 (six) hours as needed for wheezing.   AMLODIPINE (NORVASC) 5 MG TABLET    Take 5 mg by mouth daily.   ASPIRIN EC 81 MG TABLET    Take 1 tablet (81 mg total) by mouth daily.   ATORVASTATIN (LIPITOR) 40 MG TABLET    Take 40 mg by mouth every morning.   ISOSORBIDE MONONITRATE (IMDUR) 30 MG 24 HR TABLET    Take 30 mg by mouth daily.   MAGNESIUM OXIDE (MAG-OX) 400 MG TABLET    Take 400 mg by mouth at bedtime.    METOPROLOL TARTRATE (LOPRESSOR) 25 MG TABLET    Take 25 mg by mouth 2 (two) times daily.   MORPHINE (KADIAN) 30 MG 24 HR CAPSULE    Take 30 mg by mouth every 12 (twelve) hours.   MORPHINE (MS CONTIN) 15 MG 12 HR TABLET    Take 15 mg by mouth every 12 (twelve) hours.   NITROGLYCERIN (NITROSTAT) 0.3 MG SL TABLET    Place 1 tablet (0.3 mg total) under the tongue every 5 (five) minutes as needed for chest pain. Max 3 per day   OMEGA-3 FATTY ACIDS (FISH OIL) 1000 MG CAPS    Take 1 capsule by mouth daily.   OXYCODONE (OXYCONTIN) 20 MG T12A 12 HR TABLET    Take 20 mg by mouth every 12 (twelve) hours.   POTASSIUM CHLORIDE SA (K-DUR,KLOR-CON) 20 MEQ TABLET    Take 1 tablet (20 mEq total) by mouth daily.   PSYLLIUM ([employer] FIBER) 0.52 G CAPSULE    Take 0.52 g by mouth daily.   RAMIPRIL (ALTACE) 10 MG CAPSULE    Take 1 capsule (10 mg total) by mouth daily.  Modified Medications   Modified Medication Previous Medication   FLUTICASONE-SALMETEROL (ADVAIR DISKUS) 500-50 MCG/DOSE AEPB Fluticasone-Salmeterol  (ADVAIR DISKUS) 500-50 MCG/DOSE AEPB      Inhale 1 puff into the lungs 2 (two) times daily.    Inhale 1 puff into the lungs 2 (two) times daily.   TIOTROPIUM (SPIRIVA) 18 MCG INHALATION CAPSULE tiotropium (SPIRIVA) 18 MCG inhalation capsule      Place 1 capsule (18 mcg total) into inhaler and inhale once.    Place 18 mcg into inhaler and inhale 2 (two) times daily. Pt states she is only using as needed for shortness of breath (12/24/13)  Discontinued Medications   ALBUTEROL (PROVENTIL HFA;VENTOLIN HFA) 108 (90 BASE) MCG/ACT INHALER  Inhale 2 puffs into the lungs every 6 (six) hours as needed for wheezing or shortness of breath.   GABAPENTIN (NEURONTIN) 300 MG CAPSULE    Take 300 mg by mouth 2 (two) times daily. Pt states she is only taking 1 tablet at bedtime (12/24/13)   IBUPROFEN (ADVIL,MOTRIN) 200 MG TABLET    Take 200 mg by mouth daily as needed (pain).   METOPROLOL SUCCINATE (TOPROL-XL) 25 MG 24 HR TABLET    Take 25 mg by mouth daily.    MUPIROCIN OINTMENT (BACTROBAN) 2 %    Apply 1 application topically 2 (two) times daily.   OXYCODONE (ROXICODONE) 15 MG IMMEDIATE RELEASE TABLET    Take 15 mg every 4-6 hours

## 2014-02-08 NOTE — Progress Notes (Signed)
Pre visit review using our clinic review tool, if applicable. No additional management support is needed unless otherwise documented below in the visit note. 

## 2014-02-09 ENCOUNTER — Encounter: Payer: Self-pay | Admitting: Family Medicine

## 2014-02-09 DIAGNOSIS — Z96612 Presence of left artificial shoulder joint: Secondary | ICD-10-CM

## 2014-02-09 DIAGNOSIS — Z96611 Presence of right artificial shoulder joint: Secondary | ICD-10-CM

## 2014-02-09 HISTORY — DX: Presence of right artificial shoulder joint: Z96.611

## 2014-02-10 ENCOUNTER — Encounter: Payer: Self-pay | Admitting: *Deleted

## 2014-02-19 ENCOUNTER — Ambulatory Visit: Payer: PRIVATE HEALTH INSURANCE | Admitting: Cardiology

## 2014-03-05 ENCOUNTER — Telehealth: Payer: Self-pay

## 2014-03-05 ENCOUNTER — Encounter: Payer: Self-pay | Admitting: Internal Medicine

## 2014-03-05 ENCOUNTER — Ambulatory Visit (INDEPENDENT_AMBULATORY_CARE_PROVIDER_SITE_OTHER): Payer: Medicare Other | Admitting: Internal Medicine

## 2014-03-05 VITALS — BP 118/62 | HR 82 | Temp 98.1°F | Wt 132.0 lb

## 2014-03-05 DIAGNOSIS — I2510 Atherosclerotic heart disease of native coronary artery without angina pectoris: Secondary | ICD-10-CM

## 2014-03-05 DIAGNOSIS — N390 Urinary tract infection, site not specified: Secondary | ICD-10-CM

## 2014-03-05 DIAGNOSIS — R300 Dysuria: Secondary | ICD-10-CM

## 2014-03-05 LAB — POCT URINALYSIS DIPSTICK
GLUCOSE UA: NEGATIVE
Nitrite, UA: POSITIVE
PH UA: 5.5
Urobilinogen, UA: NEGATIVE

## 2014-03-05 MED ORDER — CIPROFLOXACIN HCL 250 MG PO TABS: 250.0000 mg | ORAL_TABLET | Freq: Two times a day (BID) | ORAL | Status: DC

## 2014-03-05 NOTE — Addendum Note (Signed)
Addended by: Roena Malady on: 03/05/2014 02:24 PM   Modules accepted: Orders

## 2014-03-05 NOTE — Patient Instructions (Signed)

## 2014-03-05 NOTE — Progress Notes (Signed)
Pre visit review using our clinic review tool, if applicable. No additional management support is needed unless otherwise documented below in the visit note. 

## 2014-03-05 NOTE — Progress Notes (Signed)
HPI  Pt presents to the clinic today with c/o urgency, frequency, dysuria and bladder pressure. She reports this started about 5 days ago. She has been pushing fluids and drinking cranberry juice. She denies fever, chills or low back pain. She has tried AZO without relief.   Review of Systems  Past Medical History  Diagnosis Date  . Hypertension 08/01/1995  . Rheumatoid arthritis(714.0) 04/03/1983  . Osteoarthritis 04/03/1983  . Anemia, deficiency 07/01/2004    (w/u by Dr. Lorre Nick)  . Diabetes mellitus type II 07/31/1997  . Osteoporosis 12/31/2000  . CAD (coronary artery disease) 09/01/2002    a. s/p NSTEMI 2012. b. s/p CABG 01/2012.  [name] Hyperlipemia 07/02/1999  . COPD (chronic obstructive pulmonary disease) 05/31/2004  . diverticulosis of colon   . Arteriovenous malformation of gastrointestinal tract   . Fatty liver   . Chronic back pain   . Splenic artery aneurysm   . Anxiety and depression   . GI bleed     a. 04/2011 felt diverticular requiring microembolization by vascular.  . Adrenal mass     a. Excision 2007.  [name] Restless leg syndrome   . C. difficile colitis     a. 04/2008 = severe.  [name] Hx of CABG 04/18/2012  . Diastolic CHF     a. 02/2012: resp failure with COPD exac/CHF/pleural eff.;  b.  Echocardiogram 08/29/12: EF 55-60%, mild LVH, mild LAE, impaired LV relaxation  . PAF (paroxysmal atrial fibrillation) 12/16/2013  . Status post replacement of both shoulder joints 02/09/2014    Family History  Problem Relation Age of Onset  . Hypotension Mother   . Dementia Mother   . Alcohol abuse Father   . Heart disease Father     MI, enlarged heart  . Diabetes Brother     History   Social History  . Marital Status: Widowed    Spouse Name: N/A    Number of Children: 4  . Years of Education: N/A   Occupational History  . [employer]     9 hour days-6 days/week   Social History Main Topics  . Smoking status: Former Smoker -- 0.10 packs/day for 50 years    Types: Cigarettes     Quit date: 08/01/2012  . Smokeless tobacco: Former Neurosurgeon  . Alcohol Use: Yes     Comment: red wine occassionally  . Drug Use: No  . Sexual Activity: No   Other Topics Concern  . Not on file   Social History Narrative   Married, widow 1972          Allergies  Allergen Reactions  . Other Diarrhea, Rash and Other (See Comments)    Uncoded Allergy. Allergen: mycins  Reaction: Sore tongue, raw mouth, weakness  . Penicillins Other (See Comments)    Other Reaction: NEAR SYNCOPE  . Sulfa Antibiotics Shortness Of Breath  . Morphine Other (See Comments)    Other Reaction: GI UPSET  . Morphine Sulfate Nausea And Vomiting    At high doses hallucinations Can take in low doses  . Macrolides And Ketolides Diarrhea and Nausea And Vomiting    Only causes GI distress at extremely high doses per patient  . Iron Diarrhea  . Naproxen Rash    Constitutional: Denies fever, malaise, fatigue, headache or abrupt weight changes.   GU: Pt reports urgency, frequency and pain with urination. Denies burning sensation, blood in urine, odor or discharge. Skin: Denies redness, rashes, lesions or ulcercations.   No other specific complaints in a complete review  of systems (except as listed in HPI above).    Objective:   Physical Exam  BP 118/62 mmHg  Pulse 82  Temp(Src) 98.1 F (36.7 C) (Oral)  Wt 132 lb (59.875 kg)  SpO2 97% Wt Readings from Last 3 Encounters:  03/05/14 132 lb (59.875 kg)  02/08/14 132 lb 12 oz (60.215 kg)  01/15/14 135 lb 4 oz (61.349 kg)    General: Appears her stated age, well developed, well nourished in NAD. Cardiovascular: Normal rate and rhythm. S1,S2 noted.  No murmur noted. Abdomen: Soft. Normal bowel sounds, no bruits noted. No distention or masses noted. Tender to palpation over the bladder area. No CVA tenderness.      Assessment & Plan:   Urgency, Frequency, Dysuria secondary to UTI  Urinalysis: 3+leuks, pos nitrites, small blood Will send urine  culture eRx sent if for Cipro 250 mg BID x 5 days OK to take AZO OTC Drink plenty of fluids  RTC as needed or if symptoms persist.

## 2014-03-05 NOTE — Telephone Encounter (Signed)
Pt started on 02/28/14 having pressure upon urination, frequency and pain when urinating also back pain. Pt is taking Azo and cranberry juice and drinking large quantity of water. Pt scheduled appt today at 2 pm.

## 2014-03-08 LAB — URINE CULTURE: Colony Count: >=100000

## 2014-03-10 ENCOUNTER — Telehealth: Payer: Self-pay | Admitting: Family Medicine

## 2014-03-10 ENCOUNTER — Ambulatory Visit: Payer: Medicare Other | Admitting: Family Medicine

## 2014-03-10 NOTE — Telephone Encounter (Signed)
No f/u

## 2014-03-10 NOTE — Telephone Encounter (Signed)
Patient did not come for their scheduled appointment today for follow up Please let me know if the patient needs to be contacted immediately for follow up or if no follow up is necessary.   ° °

## 2014-03-12 IMAGING — CR DG CHEST 2V
2 series · 2 of 2 positions shown · non-contrast
Comparison: 09/24/2011

CLINICAL DATA: Chest pressure.  Coronary atherosclerosis, pre
cardiac surgery.

CHEST - 2 VIEW

[view not recorded (1 of 2)]
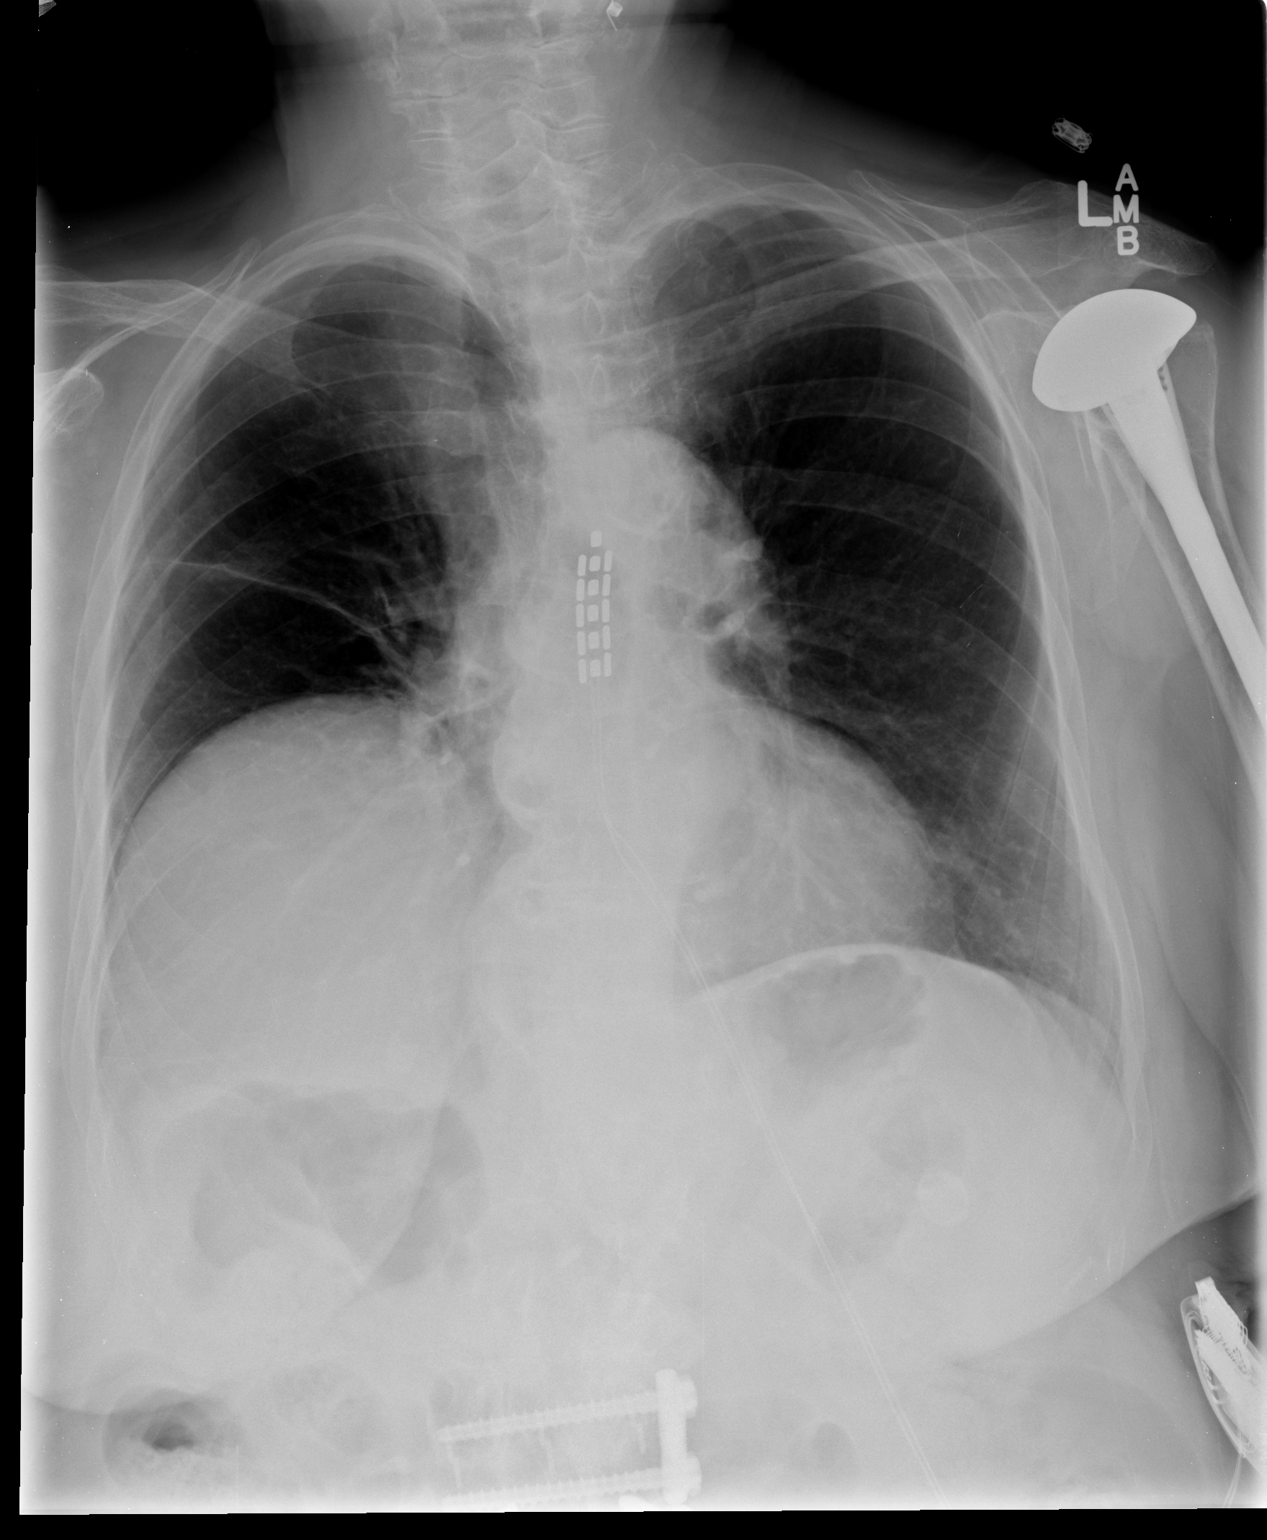

[view not recorded (2 of 2)]
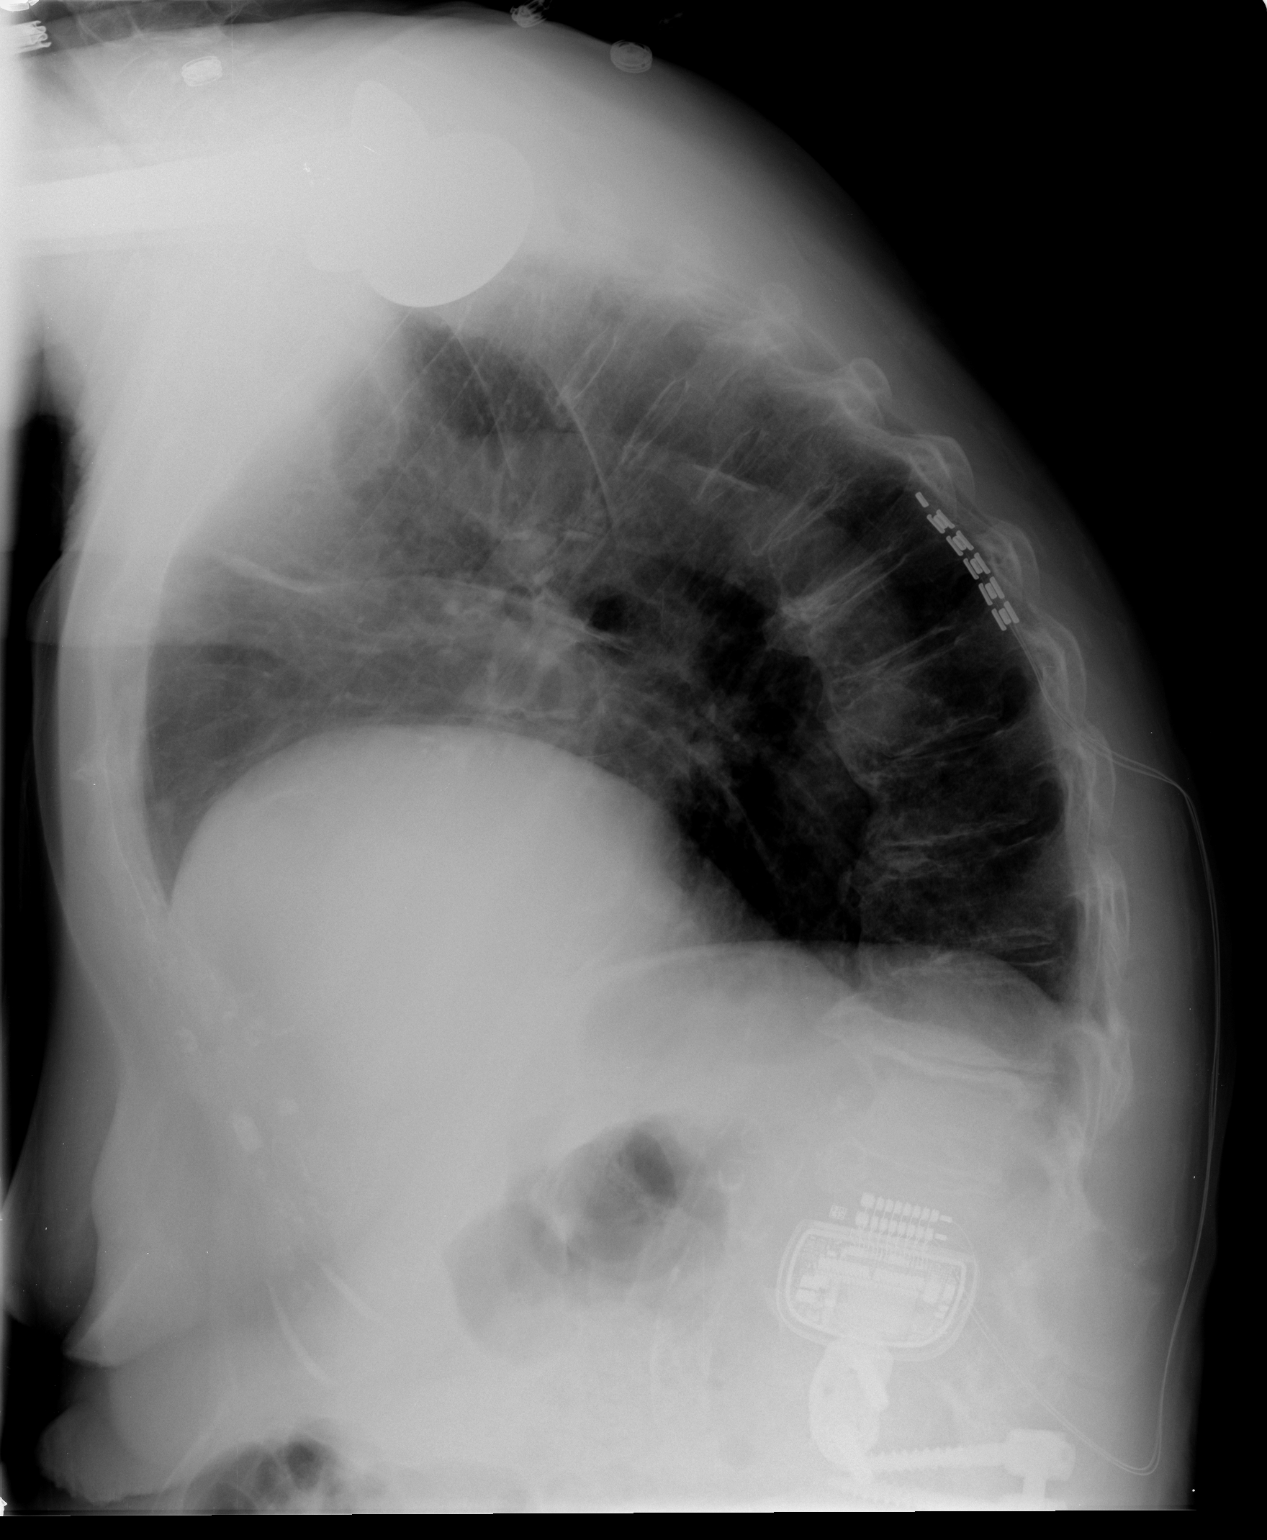

[2 of 2 positions shown; findings below may reference images not displayed]

FINDINGS: Stable elevated right hemidiaphragm with right midlung
scarring.  Dorsal column stimulator projects in the mid thoracic
spine.  Lower lumbar lateral fusion plate noted

Tortuous thoracic aorta is present.  Chronic mild scarring along
the lingula.  Overall heart size within normal limits.

Thoracic kyphosis noted.
IMPRESSION: 1.  Chronic findings including elevated right hemidiaphragm,
thoracic kyphosis and spondylosis, tortuous aorta, scarring in the
right midlung, and mild lingular scarring.

## 2014-03-19 ENCOUNTER — Other Ambulatory Visit: Payer: Self-pay | Admitting: Internal Medicine

## 2014-03-19 ENCOUNTER — Telehealth: Payer: Self-pay

## 2014-03-19 DIAGNOSIS — N390 Urinary tract infection, site not specified: Secondary | ICD-10-CM

## 2014-03-19 MED ORDER — CIPROFLOXACIN HCL 250 MG PO TABS: 250.0000 mg | ORAL_TABLET | Freq: Two times a day (BID) | ORAL | Status: DC

## 2014-03-19 NOTE — Telephone Encounter (Signed)
Pt is aware as instructed and pt advised if Sx do not resolve she must make a f/u appt--pt voiced understanding

## 2014-03-19 NOTE — Telephone Encounter (Signed)
Pt was seen 03/05/14; pt finished Cipro and symptoms never completely cleared.Now pressure,frequency,?fever. Pt request med sent to [facility]. Pt request cb. Pt is not taking AZO. No appts available at [facility] or [facility].Please advise.

## 2014-03-19 NOTE — Telephone Encounter (Signed)
Will repeat cipro, if symptoms do not resolve, will need to follow up

## 2014-03-22 ENCOUNTER — Ambulatory Visit (INDEPENDENT_AMBULATORY_CARE_PROVIDER_SITE_OTHER): Payer: Medicare Other | Admitting: Family Medicine

## 2014-03-22 ENCOUNTER — Encounter: Payer: Self-pay | Admitting: Family Medicine

## 2014-03-22 VITALS — BP 140/80 | HR 92 | Temp 97.8°F | Ht 60.0 in | Wt 135.5 lb

## 2014-03-22 DIAGNOSIS — N3000 Acute cystitis without hematuria: Secondary | ICD-10-CM

## 2014-03-22 DIAGNOSIS — I2510 Atherosclerotic heart disease of native coronary artery without angina pectoris: Secondary | ICD-10-CM

## 2014-03-22 DIAGNOSIS — R300 Dysuria: Secondary | ICD-10-CM

## 2014-03-22 LAB — POCT URINALYSIS DIPSTICK
BILIRUBIN UA: NEGATIVE
GLUCOSE UA: NEGATIVE
Ketones, UA: NEGATIVE
NITRITE UA: POSITIVE
Spec Grav, UA: >=1.03
UROBILINOGEN UA: 0.2
pH, UA: 5

## 2014-03-22 MED ORDER — LEVOFLOXACIN 500 MG PO TABS: 500.0000 mg | ORAL_TABLET | Freq: Every day | ORAL | Status: DC

## 2014-03-22 NOTE — Progress Notes (Signed)
Pre visit review using our clinic review tool, if applicable. No additional management support is needed unless otherwise documented below in the visit note. 

## 2014-03-22 NOTE — Progress Notes (Signed)
Dr. Karleen Hampshire T. Deronda Christian, MD, CAQ Sports Medicine Primary Care and Sports Medicine [address] [place], [address] Phone: [phone] Fax: [phone]  03/22/2014  Patient: [name], MRN: [id number], DOB: 01-15-1932, 80 y.o.  Primary Physician:  Hannah Beat, MD  Chief Complaint: Dysuria  Subjective:   [name] is a 80 y.o. very pleasant female patient who presents with the following:  F/u UTI:  Persistent UTI, the patient was diagnosed on 03/05/2014 with a urinary tract infection, which ultimately was pansensitive, and she was placed on Cipro 250 mg by mouth twice a day for a total of 5 days. She reports that she had Delacruz relief of symptoms, and she still currently has urgency and burning that are quite significant.   Past Medical History, Surgical History, Social History, Family History, Problem List, Medications, and Allergies have been reviewed and updated if relevant.  ROS: GEN: Acute illness details above GI: Tolerating PO intake GU: maintaining adequate hydration and urination Pulm: Delacruz SOB Interactive and getting along well at home.  Otherwise, ROS is as per the HPI.   Objective:   BP 140/80 mmHg  Pulse 92  Temp(Src) 97.8 F (36.6 C) (Oral)  Ht 5' (1.524 m)  Wt 135 lb 8 oz (61.462 kg)  BMI 26.46 kg/m2   GEN: WDWN, NAD, Non-toxic, A & O x 3 HEENT: Atraumatic, Normocephalic. Neck supple. Delacruz masses, Delacruz LAD. Ears and Nose: Delacruz external deformity. CV: RRR, Delacruz M/G/R. Delacruz JVD. Delacruz thrill. Delacruz extra heart sounds. Delacruz CVAT EXTR: Delacruz c/c/e NEURO Normal gait.  PSYCH: Normally interactive. Conversant. Not depressed or anxious appearing.  Calm demeanor.     Laboratory and Imaging Data: Results for orders placed or performed in visit on 03/22/14  POCT urinalysis dipstick  Result Value Ref Range   Color, UA yellow    Clarity, UA cloudy    Glucose, UA negative    Bilirubin, UA negative    Ketones, UA negative    Spec Grav, UA >=1.030    Blood, UA  trace    pH, UA 5.0    Protein, UA 1+    Urobilinogen, UA 0.2    Nitrite, UA positive    Leukocytes, UA large (3+)     Recent Results (from the past 2160 hour(s))  Basic metabolic panel     Status: Abnormal   Collection Time: 12/24/13  3:55 PM  Result Value Ref Range   Sodium 140 135 - 145 mEq/L   Potassium 4.1 3.5 - 5.1 mEq/L   Chloride 105 96 - 112 mEq/L   CO2 30 19 - 32 mEq/L   Glucose, Bld 111 (H) 70 - 99 mg/dL   BUN 25 (H) 6 - 23 mg/dL   Creatinine, Ser 1.0 0.4 - 1.2 mg/dL   Calcium 9.3 8.4 - 83.1 mg/dL   GFR 51.76 (L) >16.07 mL/min  Magnesium     Status: None   Collection Time: 12/24/13  3:55 PM  Result Value Ref Range   Magnesium 1.8 1.5 - 2.5 mg/dL  POCT urinalysis dipstick     Status: Abnormal   Collection Time: 03/05/14  2:22 PM  Result Value Ref Range   Color, UA amber    Clarity, UA cloudy    Glucose, UA neg    Bilirubin, UA +    Ketones, UA +    Spec Grav, UA >=1.030    Blood, UA large    pH, UA 5.5    Protein, UA ++  Urobilinogen, UA negative    Nitrite, UA positive    Leukocytes, UA large (3+)   Urine culture     Status: None   Collection Time: 03/05/14  2:33 PM  Result Value Ref Range   Culture ESCHERICHIA COLI    Colony Count >=100,000 COLONIES/ML    Organism ID, Bacteria ESCHERICHIA COLI       Susceptibility   Escherichia coli -  (Delacruz method available)    AMPICILLIN 8 Sensitive     AMOX/CLAVULANIC 4 Sensitive     AMPICILLIN/SULBACTAM 4 Sensitive     PIP/TAZO <=4 Sensitive     IMIPENEM <=0.25 Sensitive     CEFAZOLIN <=4 Sensitive     CEFTRIAXONE <=1 Sensitive     CEFTAZIDIME <=1 Sensitive     CEFEPIME <=1 Sensitive     GENTAMICIN <=1 Sensitive     TOBRAMYCIN <=1 Sensitive     CIPROFLOXACIN <=0.25 Sensitive     LEVOFLOXACIN <=0.12 Sensitive     NITROFURANTOIN <=16 Sensitive     TRIMETH/SULFA <=20 Sensitive   POCT urinalysis dipstick     Status: Abnormal   Collection Time: 03/22/14  2:31 PM  Result Value Ref Range   Color, UA yellow     Clarity, UA cloudy    Glucose, UA negative    Bilirubin, UA negative    Ketones, UA negative    Spec Grav, UA >=1.030    Blood, UA trace    pH, UA 5.0    Protein, UA 1+    Urobilinogen, UA 0.2    Nitrite, UA positive    Leukocytes, UA large (3+)      Assessment and Plan:   Dysuria - Plan: POCT urinalysis dipstick, Urine culture  Acute cystitis without hematuria  Recurrence of symptoms. Treat with 7 days of Levaquin, 500 mg. Unclear why this would have been treated by appropriate uses antibiotics, but I will extend the course and increase the strength.  Follow-up: Delacruz Follow-up on file.  New Prescriptions   LEVOFLOXACIN (LEVAQUIN) 500 MG TABLET    Take 1 tablet (500 mg total) by mouth daily.   Orders Placed This Encounter  Procedures  . Urine culture  . POCT urinalysis dipstick    Signed,  Karleen Hampshire T. Oaklyn Jakubek, MD   Patient's Medications  New Prescriptions   LEVOFLOXACIN (LEVAQUIN) 500 MG TABLET    Take 1 tablet (500 mg total) by mouth daily.  Previous Medications   ALBUTEROL (PROAIR HFA) 108 (90 BASE) MCG/ACT INHALER    Inhale 2 puffs into the lungs 4 (four) times daily.   ALBUTEROL (PROVENTIL) (5 MG/ML) 0.5% NEBULIZER SOLUTION    Take 0.5 mLs (2.5 mg total) by nebulization every 6 (six) hours as needed for wheezing.   AMLODIPINE (NORVASC) 5 MG TABLET    Take 5 mg by mouth daily.   ASPIRIN EC 81 MG TABLET    Take 1 tablet (81 mg total) by mouth daily.   ATORVASTATIN (LIPITOR) 40 MG TABLET    Take 40 mg by mouth every morning.   CIPROFLOXACIN (CIPRO) 250 MG TABLET    Take 1 tablet (250 mg total) by mouth 2 (two) times daily.   FLUTICASONE-SALMETEROL (ADVAIR DISKUS) 500-50 MCG/DOSE AEPB    Inhale 1 puff into the lungs 2 (two) times daily.   ISOSORBIDE MONONITRATE (IMDUR) 30 MG 24 HR TABLET    Take 30 mg by mouth daily.   MAGNESIUM OXIDE (MAG-OX) 400 MG TABLET    Take 400 mg by mouth at bedtime.  METOPROLOL TARTRATE (LOPRESSOR) 25 MG TABLET    Take 25 mg by mouth 2 (two)  times daily.   MORPHINE (KADIAN) 30 MG 24 HR CAPSULE    Take 30 mg by mouth every 12 (twelve) hours.   MORPHINE (MS CONTIN) 15 MG 12 HR TABLET    Take 15 mg by mouth every 12 (twelve) hours.   NITROGLYCERIN (NITROSTAT) 0.3 MG SL TABLET    Place 1 tablet (0.3 mg total) under the tongue every 5 (five) minutes as needed for chest pain. Max 3 per day   OMEGA-3 FATTY ACIDS (FISH OIL) 1000 MG CAPS    Take 1 capsule by mouth daily.   OXYCODONE (OXYCONTIN) 20 MG T12A 12 HR TABLET    Take 20 mg by mouth every 12 (twelve) hours.   POTASSIUM CHLORIDE SA (K-DUR,KLOR-CON) 20 MEQ TABLET    Take 1 tablet (20 mEq total) by mouth daily.   PSYLLIUM ([employer] FIBER) 0.52 G CAPSULE    Take 0.52 g by mouth daily.   RAMIPRIL (ALTACE) 10 MG CAPSULE    Take 1 capsule (10 mg total) by mouth daily.   TIOTROPIUM (SPIRIVA) 18 MCG INHALATION CAPSULE    Place 1 capsule (18 mcg total) into inhaler and inhale once.  Modified Medications   Delacruz medications on file  Discontinued Medications   Delacruz medications on file

## 2014-03-23 ENCOUNTER — Encounter: Payer: Self-pay | Admitting: Cardiology

## 2014-03-24 LAB — URINE CULTURE

## 2014-03-25 ENCOUNTER — Other Ambulatory Visit: Payer: Self-pay | Admitting: *Deleted

## 2014-03-25 ENCOUNTER — Telehealth: Payer: Self-pay

## 2014-03-25 MED ORDER — NITROFURANTOIN MACROCRYSTAL 100 MG PO CAPS: 100.0000 mg | ORAL_CAPSULE | Freq: Two times a day (BID) | ORAL | Status: DC

## 2014-03-25 MED ORDER — NITROFURANTOIN MONOHYD MACRO 100 MG PO CAPS: 100.0000 mg | ORAL_CAPSULE | Freq: Two times a day (BID) | ORAL | Status: DC

## 2014-03-25 NOTE — Telephone Encounter (Signed)
Chris with [facility] called and pt is at pharmacy to pick up new abx. Macrobid 100 mg taking 1 cap by mouth twice a day for one week #14 x 0 from result note given to [place]. Thayer Ohm said that Macrobid only comes in 100 mg strength.

## 2014-04-28 ENCOUNTER — Ambulatory Visit (INDEPENDENT_AMBULATORY_CARE_PROVIDER_SITE_OTHER): Payer: Medicare Other | Admitting: Family Medicine

## 2014-04-28 ENCOUNTER — Encounter: Payer: Self-pay | Admitting: Family Medicine

## 2014-04-28 VITALS — BP 130/82 | HR 101 | Temp 98.2°F | Ht 60.0 in | Wt 130.8 lb

## 2014-04-28 DIAGNOSIS — M65342 Trigger finger, left ring finger: Secondary | ICD-10-CM

## 2014-04-28 DIAGNOSIS — M549 Dorsalgia, unspecified: Secondary | ICD-10-CM

## 2014-04-28 DIAGNOSIS — R350 Frequency of micturition: Secondary | ICD-10-CM

## 2014-04-28 DIAGNOSIS — M65341 Trigger finger, right ring finger: Secondary | ICD-10-CM

## 2014-04-28 DIAGNOSIS — G8929 Other chronic pain: Secondary | ICD-10-CM

## 2014-04-28 LAB — POCT URINALYSIS DIPSTICK
BILIRUBIN UA: NEGATIVE
Glucose, UA: NEGATIVE
KETONES UA: NEGATIVE
NITRITE UA: POSITIVE
Spec Grav, UA: >=1.03
Urobilinogen, UA: 0.2
pH, UA: 6

## 2014-04-28 MED ORDER — LEVOFLOXACIN 500 MG PO TABS: 500.0000 mg | ORAL_TABLET | Freq: Every day | ORAL | Status: DC

## 2014-04-28 MED ORDER — METHYLPREDNISOLONE ACETATE 40 MG/ML IJ SUSP
20.0000 mg | Freq: Once | INTRAMUSCULAR | Status: AC
  Administered 2014-04-28: 20 mg via INTRA_ARTICULAR

## 2014-04-28 NOTE — Progress Notes (Signed)
Dr. Karleen Hampshire T. Gedalya Jim, MD, CAQ Sports Medicine Primary Care and Sports Medicine [address] [place], [address] Phone: [phone] Fax: [phone]  04/28/2014  Patient: [name], MRN: [id number], DOB: July 11, 1931, 81 y.o.  Primary Physician:  Hannah Beat, MD  Chief Complaint: Finger is locking up and Urinary Frequency  Subjective:   [name] is a 81 y.o. very pleasant female patient who presents with the following:  Pleasant elderly patient with multiple issues.  Dysuria, UTI: The patient has had repetitive episodes of urinary tract infections for many years.  She most recently had one about 4-6 Hearld ago that was successfully treated with Levaquin.  She has significant change in the color of her urine with bad smell along with increased frequency and dysuria.  No gross blood.  She also has bilateral trigger fingers on the 3rd digits.  Chronic back pain.  She has been seeing Dr. [name], who felt that she was not an operative candidate, and ultimately set her up with pain management.  She now is on very high doses of narcotics.  She is currently going to a pain management specialist in [place], and she asks to see if I can help her get set up to see Dr. Pernell Dupre in [place].    Past Medical History, Surgical History, Social History, Family History, Problem List, Medications, and Allergies have been reviewed and updated if relevant.  GEN: No fevers, chills. Nontoxic.  MSK: Detailed in the HPI GI: tolerating PO intake without difficulty Neuro: No numbness, parasthesias, or tingling associated. Otherwise the pertinent positives of the ROS are noted above.   Objective:   BP 130/82 mmHg  Pulse 101  Temp(Src) 98.2 F (36.8 C) (Oral)  Ht 5' (1.524 m)  Wt 130 lb 12 oz (59.308 kg)  BMI 25.54 kg/m2  SpO2 95%   GEN: WDWN, A&Ox4,NAD. Non-toxic HEENT: Atraumatc, normocephalic. CV: RRR, No M/G/R PULM: CTA B, No wheezes, crackles, or rhonchi ABD: S, NT, ND, +BS,  no rebound. No CVAT. No suprapubic tenderness. EXT: No c/c/e   Bilateral hands with diffuse changes consistent with osteoarthritis with extensive changes at essentially all joints at the DIP, PIP, MCP, and CMC joints.  There is notable triggering at the 3rd digits bilaterally.  She also has some evidence of a healing trauma with bruising and healing laceration on the LEFT dorsum of her hand.  Bony aspects of all hands and wrists are nontender.  Radiology: No results found.   Results for orders placed or performed in visit on 04/28/14  POCT urinalysis dipstick  Result Value Ref Range   Color, UA yellow    Clarity, UA sl. cloudy    Glucose, UA negative    Bilirubin, UA negative    Ketones, UA negative    Spec Grav, UA >=1.030    Blood, UA small    pH, UA 6.0    Protein, UA 1+    Urobilinogen, UA 0.2    Nitrite, UA positive    Leukocytes, UA moderate (2+)     Comprehensive Metabolic Panel:    Component Value Date/Time   NA 140 12/24/2013 1555   NA 140 05/31/2011 1038   K 4.1 12/24/2013 1555   CL 105 12/24/2013 1555   CO2 30 12/24/2013 1555   BUN 25* 12/24/2013 1555   BUN 25 05/31/2011 1038   CREATININE 1.0 12/24/2013 1555   CREATININE 0.97 03/21/2012 1659   GLUCOSE 111* 12/24/2013 1555   GLUCOSE 87 05/31/2011 1038  CALCIUM 9.3 12/24/2013 1555   AST 18 10/23/2013 1110   ALT 13 10/23/2013 1110   ALKPHOS 41 10/23/2013 1110   BILITOT 0.7 10/23/2013 1110   PROT 6.8 10/23/2013 1110   PROT 6.9 10/29/2011 1003   ALBUMIN 3.6 10/23/2013 1110      Assessment and Plan:   Chronic back pain - Plan: Ambulatory referral to Pain Clinic  Urinary frequency - Plan: POCT urinalysis dipstick  Trigger ring finger of left hand - Plan: methylPREDNISolone acetate (DEPO-MEDROL) injection 20 mg  Trigger ring finger of right hand - Plan: methylPREDNISolone acetate (DEPO-MEDROL) injection 20 mg  Clinical history and urinalysis are highly suggestive of urinary tract infection.  She does not  have enough urine to send for a culture.  I am going to presumptively treat this with levofloxacin.  Chronic back pain, inoperable, many years.  Ongoing high opioid needs.  Given her location and where she lives, 81 years old, multiple medical problems, I think it is certainly quite reasonable to have a pain clinic closer to her home follow her.  We are going to attempt to get her set up to see Dr. Hulan Saas.   Trigger Finger Injection, RIGHT Verbal consent was obtained. Risks (including rare risk of infection, potential risk for skin lightening and potential atrophy), benefits and alternatives were discussed. Prepped with Chloraprep and Ethyl Chloride used for anesthesia. Under sterile conditions, patient injected at palmar crease aiming distally with 45 degree angle towards nodule; injected directly into tendon sheath. Medication flowed freely without resistance.  Needle size: 22 gauge 1 1/2 inch Injection: 1/2 cc of Lidocaine 1% and Depo-Medrol 20 mg   Trigger Finger Injection, LEFT Verbal consent was obtained. Risks (including rare risk of infection, potential risk for skin lightening and potential atrophy), benefits and alternatives were discussed. Prepped with Chloraprep and Ethyl Chloride used for anesthesia. Under sterile conditions, patient injected at palmar crease aiming distally with 45 degree angle towards nodule; injected directly into tendon sheath. Medication flowed freely without resistance.  Needle size: 22 gauge 1 1/2 inch Injection: 1/2 cc of Lidocaine 1% and Depo-Medrol 20 mg   New Prescriptions   LEVOFLOXACIN (LEVAQUIN) 500 MG TABLET    Take 1 tablet (500 mg total) by mouth daily.   Orders Placed This Encounter  Procedures  . Ambulatory referral to Pain Clinic  . POCT urinalysis dipstick    Signed,  Karleen Hampshire T. Arisbel Maione, MD   Patient's Medications  New Prescriptions   LEVOFLOXACIN (LEVAQUIN) 500 MG TABLET    Take 1 tablet (500 mg total) by mouth daily.  Previous  Medications   ALBUTEROL (PROAIR HFA) 108 (90 BASE) MCG/ACT INHALER    Inhale 2 puffs into the lungs 4 (four) times daily.   ALBUTEROL (PROVENTIL) (5 MG/ML) 0.5% NEBULIZER SOLUTION    Take 0.5 mLs (2.5 mg total) by nebulization every 6 (six) hours as needed for wheezing.   ASPIRIN EC 81 MG TABLET    Take 1 tablet (81 mg total) by mouth daily.   ATORVASTATIN (LIPITOR) 40 MG TABLET    Take 40 mg by mouth every morning.   FLUTICASONE-SALMETEROL (ADVAIR DISKUS) 500-50 MCG/DOSE AEPB    Inhale 1 puff into the lungs 2 (two) times daily.   ISOSORBIDE MONONITRATE (IMDUR) 30 MG 24 HR TABLET    Take 30 mg by mouth daily.   MAGNESIUM OXIDE (MAG-OX) 400 MG TABLET    Take 400 mg by mouth at bedtime.    METOPROLOL TARTRATE (LOPRESSOR) 25 MG TABLET  Take 25 mg by mouth 2 (two) times daily.   MORPHINE (KADIAN) 30 MG 24 HR CAPSULE    Take 30 mg by mouth every 12 (twelve) hours.   MORPHINE (MS CONTIN) 15 MG 12 HR TABLET    Take 15 mg by mouth every 12 (twelve) hours.   NITROGLYCERIN (NITROSTAT) 0.3 MG SL TABLET    Place 1 tablet (0.3 mg total) under the tongue every 5 (five) minutes as needed for chest pain. Max 3 per day   OMEGA-3 FATTY ACIDS (FISH OIL) 1000 MG CAPS    Take 1 capsule by mouth daily.   OXYCODONE (OXYCONTIN) 20 MG T12A 12 HR TABLET    Take 20 mg by mouth every 12 (twelve) hours.   POTASSIUM CHLORIDE SA (K-DUR,KLOR-CON) 20 MEQ TABLET    Take 1 tablet (20 mEq total) by mouth daily.   PSYLLIUM ([employer] FIBER) 0.52 G CAPSULE    Take 0.52 g by mouth daily.   RAMIPRIL (ALTACE) 10 MG CAPSULE    Take 1 capsule (10 mg total) by mouth daily.   TIOTROPIUM (SPIRIVA) 18 MCG INHALATION CAPSULE    Place 1 capsule (18 mcg total) into inhaler and inhale once.  Modified Medications   No medications on file  Discontinued Medications   AMLODIPINE (NORVASC) 5 MG TABLET    Take 5 mg by mouth daily.   CIPROFLOXACIN (CIPRO) 250 MG TABLET    Take 1 tablet (250 mg total) by mouth 2 (two) times daily.   LEVOFLOXACIN  (LEVAQUIN) 500 MG TABLET    Take 1 tablet (500 mg total) by mouth daily.   NITROFURANTOIN, MACROCRYSTAL-MONOHYDRATE, (MACROBID) 100 MG CAPSULE    Take 1 capsule (100 mg total) by mouth 2 (two) times daily. For one week

## 2014-04-28 NOTE — Progress Notes (Signed)
Pre visit review using our clinic review tool, if applicable. No additional management support is needed unless otherwise documented below in the visit note. 

## 2014-04-28 NOTE — Patient Instructions (Signed)

## 2014-04-30 ENCOUNTER — Ambulatory Visit (INDEPENDENT_AMBULATORY_CARE_PROVIDER_SITE_OTHER)
Admission: RE | Admit: 2014-04-30 | Discharge: 2014-04-30 | Disposition: A | Discharge status: Home / Self Care | Payer: Medicare Other | Source: Ambulatory Visit | Attending: Family Medicine | Admitting: Family Medicine

## 2014-04-30 ENCOUNTER — Telehealth: Payer: Self-pay

## 2014-04-30 ENCOUNTER — Encounter: Payer: Self-pay | Admitting: Family Medicine

## 2014-04-30 ENCOUNTER — Ambulatory Visit (INDEPENDENT_AMBULATORY_CARE_PROVIDER_SITE_OTHER): Payer: Medicare Other | Admitting: Family Medicine

## 2014-04-30 VITALS — BP 158/82 | HR 75 | Temp 98.2°F

## 2014-04-30 DIAGNOSIS — M25512 Pain in left shoulder: Secondary | ICD-10-CM

## 2014-04-30 DIAGNOSIS — W19XXXA Unspecified fall, initial encounter: Secondary | ICD-10-CM

## 2014-04-30 DIAGNOSIS — M79642 Pain in left hand: Secondary | ICD-10-CM

## 2014-04-30 DIAGNOSIS — Y92009 Unspecified place in unspecified non-institutional (private) residence as the place of occurrence of the external cause: Principal | ICD-10-CM

## 2014-04-30 DIAGNOSIS — Y92099 Unspecified place in other non-institutional residence as the place of occurrence of the external cause: Secondary | ICD-10-CM

## 2014-04-30 DIAGNOSIS — R040 Epistaxis: Secondary | ICD-10-CM | POA: Diagnosis not present

## 2014-04-30 DIAGNOSIS — S0121XA Laceration without foreign body of nose, initial encounter: Secondary | ICD-10-CM

## 2014-04-30 MED ORDER — CYCLOBENZAPRINE HCL 5 MG PO TABS
5.0000 mg | ORAL_TABLET | Freq: Every day | ORAL | Status: DC
Start: 2014-04-30 — End: 2014-08-18

## 2014-04-30 NOTE — Progress Notes (Signed)
Pre visit review using our clinic review tool, if applicable. No additional management support is needed unless otherwise documented below in the visit note. 

## 2014-04-30 NOTE — Patient Instructions (Addendum)
Wash wound on nose with warm soapy water. Glue shoul remain un place till healing  Ice left hand, elevate.  Buddy tape fingers if helps with pain.  X-rays show no fracture  Use cyclobenzaprine for muscle tightness and spasm in left shoulder and neck.  Use oxycodone  For breakthrough pain. Go to ER if new neurologic changes, weakness, numbness, confusion. Close follow up on Monday with Dr. Patsy Lager.

## 2014-04-30 NOTE — Telephone Encounter (Signed)
30-45 mins ago pt fell at home in [employer]; pt got feet tangled in dog leash. Pt drove to office with cut on nose, pain lt hand from little finger to wrist  And slight pain in lt shoulder. Pt did not lose consciousness. Now no h/a, dizziness, CP or SOB. On 04/28/14 pt had cortisone injection to lt hand due to fingers freezing up. When tried to gently cleanse blood off nose pt had nose bleed from inside of nose on lt side. After 5 - 10 mins pressure on nose by pt the nose bleed stopped. Pt will see Dr Ermalene Searing this afternoon.

## 2014-04-30 NOTE — Progress Notes (Signed)
   Subjective:    Patient ID: [name], female    DOB: 06-28-1931, 81 y.o.   MRN: [id number]  HPI    Review of Systems     Objective:   Physical Exam        Assessment & Plan:

## 2014-04-30 NOTE — Progress Notes (Signed)
Subjective:    Patient ID: [name], female    DOB: 1932/03/13, 81 y.o.   MRN: [id number]  HPI  81 year old pt with osteoporosis, diabetes controlled of Dr. Cyndie Chime presents as a walk in  after a fall today catching foot in dogs leash. She landed on face to ground, no LOC. Landed on outstretched hand. Pain is not bad in face, but nose has bleed from inside off anfd on, has stooped now.  She has severe pain in left hand over 4th and 5th digits.  Has mild pain in left shoulder.  She applied pressure to bleeding, it stopped, ice on left hand.  No Mental status changes. No neuro changes.   She is currently on antibiotic for UTI ( levofloxacin) x 7 days. Just saw PCP on 04/28/2014. BP Readings from Last 3 Encounters:  04/30/14 158/82  04/28/14 130/82  03/22/14 140/80   Later pt noted maybe she DID lose conciousness for a few seconds but is not sure.     Review of Systems  Constitutional: Negative for fever and fatigue.  HENT: Negative for ear pain.   Eyes: Negative for pain.  Respiratory: Negative for chest tightness and shortness of breath.   Cardiovascular: Negative for chest pain, palpitations and leg swelling.  Gastrointestinal: Negative for abdominal pain.  Genitourinary: Negative for dysuria.  Musculoskeletal: Positive for back pain.       Tightness in left upper back and shoulder       Objective:   Physical Exam  Constitutional: She is oriented to person, place, and time. Vital signs are normal. She appears well-developed and well-nourished. She is cooperative.  Non-toxic appearance. She does not appear ill. No distress.  Elderly female in mild distress  HENT:  Head: Normocephalic.  Right Ear: Hearing, tympanic membrane, external ear and ear canal normal. Tympanic membrane is not erythematous, not retracted and not bulging.  Left Ear: Hearing, tympanic membrane, external ear and ear canal normal. Tympanic membrane is not erythematous, not retracted and not  bulging.  Nose: No mucosal edema or rhinorrhea. Epistaxis is observed. Right sinus exhibits no maxillary sinus tenderness and no frontal sinus tenderness. Left sinus exhibits no maxillary sinus tenderness and no frontal sinus tenderness.    Mouth/Throat: Uvula is midline, oropharynx is clear and moist and mucous membranes are normal.  1 cm laceration over bridge of nose.  Wound cleared with saline and dermabond applied to keep laceration closed. Minimal bleeding from laceration but some epistaxis stared. Pressure applied an stopped.  Eyes: Conjunctivae, EOM and lids are normal. Pupils are equal, round, and reactive to light. Lids are everted and swept, no foreign bodies found.  Neck: Trachea normal and normal range of motion. Neck supple. Carotid bruit is not present. No thyroid mass and no thyromegaly present.  Cardiovascular: Normal rate, regular rhythm, S1 normal, S2 normal, normal heart sounds, intact distal pulses and normal pulses.  Exam reveals no gallop and no friction rub.   No murmur heard. Pulmonary/Chest: Effort normal. No tachypnea. No respiratory distress. She has no decreased breath sounds. She has wheezes. She has no rhonchi. She has no rales.  Scattered wheeze diffusely  Abdominal: Soft. Normal appearance and bowel sounds are normal. There is no tenderness.  Musculoskeletal:       Left shoulder: Normal. She exhibits normal range of motion, no tenderness, no bony tenderness, no swelling, no deformity and no laceration.       Cervical back: She exhibits tenderness. She exhibits normal range  of motion and no bony tenderness.       Left hand: She exhibits decreased range of motion, tenderness, bony tenderness and swelling. She exhibits normal capillary refill. Normal sensation noted. Decreased strength noted. She exhibits finger abduction. She exhibits no thumb/finger opposition and no wrist extension trouble.  Mild ttp in left upper back and shoulder  Neurological: She is alert and  oriented to person, place, and time. She has normal strength and normal reflexes. No cranial nerve deficit or sensory deficit. She exhibits normal muscle tone. She displays a negative Romberg sign. Coordination and gait normal. GCS eye subscore is 4. GCS verbal subscore is 5. GCS motor subscore is 6.  Nml cerebellar exam  No papilledema  Skin: Skin is warm, dry and intact. No rash noted.  Psychiatric: She has a normal mood and affect. Her speech is normal and behavior is normal. Judgment and thought content normal. Her mood appears not anxious. Cognition and memory are normal. Cognition and memory are not impaired. She does not exhibit a depressed mood. She exhibits normal recent memory and normal remote memory.          Assessment & Plan:   Pt with Accidental fall at home, tripped and caught foot in dog leash. ? LOC but no neuro changes per symptoms or exam. No clear indication for emergent head CT.  Laceration of nasal bridge cleaned and closed with dermabond without complication. Wash with warm soapy water.  Epistaxis: treated effectively with pressure and pt told to not try to remove clot. To ER if not stopping > 5 min.  Left hand pain: No fracture on X-ray. Likely contusion and soft issue injury. No sign of tendon/ligamnet tear. Pt will treat pain with morphine and oxycodone she already has available. Buddy tape to help with pain. Follow up with PCP/SM on Monday.  Left shoulder pain,mild, neck stiffness: Treat with low dose muscle relaxant prn.  Gentle ROM and ice prn.

## 2014-05-03 ENCOUNTER — Encounter: Payer: Self-pay | Admitting: Family Medicine

## 2014-05-03 ENCOUNTER — Ambulatory Visit (INDEPENDENT_AMBULATORY_CARE_PROVIDER_SITE_OTHER): Payer: Medicare Other | Admitting: Family Medicine

## 2014-05-03 VITALS — BP 110/70 | HR 72 | Temp 97.8°F | Ht 60.0 in | Wt 127.0 lb

## 2014-05-03 DIAGNOSIS — W19XXXD Unspecified fall, subsequent encounter: Secondary | ICD-10-CM

## 2014-05-03 DIAGNOSIS — M79642 Pain in left hand: Secondary | ICD-10-CM

## 2014-05-03 DIAGNOSIS — Y92009 Unspecified place in unspecified non-institutional (private) residence as the place of occurrence of the external cause: Secondary | ICD-10-CM

## 2014-05-03 DIAGNOSIS — S0121XD Laceration without foreign body of nose, subsequent encounter: Secondary | ICD-10-CM

## 2014-05-03 DIAGNOSIS — S060X1A Concussion with loss of consciousness of 30 minutes or less, initial encounter: Secondary | ICD-10-CM

## 2014-05-03 NOTE — Progress Notes (Signed)
Dr. Karleen Hampshire T. Anirudh Baiz, MD, CAQ [facility] [address] [place], [address] Phone: [phone] Fax: [phone]  05/03/2014  Patient: [name], MRN: [id number], DOB: [birth year], 81 y.o.  Primary Physician:  Hannah Beat, MD  Chief Complaint: Follow-up and Fatigue   HPI  DOI 04/30/2014  The patient is seen in follow-up from office visit last Friday with my partner.  At that point she fell and had a severe case of epistaxis and a small laceration on the bridge of her nose which was closed with Dermabond.  These have done well.  She was walking with her dog, and got pulled to the ground and slammed her face.  Today she has extensive bruising around more her RIGHT eye and around and adjacent to the nose.  She may or may not have lost consciousness.  She buried in her answer previously.  And she is not entirely sure.  She does right now have some blurred vision.  She has felt a little bit dizzy and unsteady compared to normal.  She has been a little bit more emotional compared to baseline.  She also has a headache, but it is not severe.  She has not had any nauseousness or vomiting, but her oral intake is decreased somewhat.  She felt worse when she was trying to read.  Her LEFT 4th and 5th digits were hurting quite a bit and these were buddy taped by my partner.  Now they are feeling quite a bit better, and she wants to know if she can remove her buddy tape.  Plain films of the LEFT hand ring independently reviewed by myself and there is no evidence for occult fracture.  04/30/2014 Last OV with Amy Bedsole: 81 year old pt with osteoporosis, diabetes controlled of Dr. Cyndie Chime presents as a walk in  after a fall today catching foot in dogs leash. She landed on face to ground, no LOC. Landed on outstretched hand. Pain is not bad in face, but nose has bleed from inside off anfd on, has stooped now.  She has severe pain in left hand over 4th  and 5th digits.  Has mild pain in left shoulder.  She applied pressure to bleeding, it stopped, ice on left hand.  No Mental status changes. No neuro changes.   She is currently on antibiotic for UTI ( levofloxacin) x 7 days. Just saw PCP on 04/28/2014. BP Readings from Last 3 Encounters:  05/03/14 110/70  04/30/14 158/82  04/28/14 130/82   Later pt noted maybe she DID lose conciousness for a few seconds but is not sure.  The PMH, PSH, Social History, Family History, Medications, and allergies have been reviewed in [facility], and have been updated if relevant.    GEN: No acute illnesses, no fevers, chills. GI: No n/v/d, eating decreased a little bit. Pulm: No SOB Otherwise, the pertinent positives and negatives are listed above and in the HPI, otherwise a full review of systems has been reviewed and is negative unless noted positive.      Objective:    BP 110/70 mmHg  Pulse 72  Temp(Src) 97.8 F (36.6 C) (Oral)  Ht 5' (1.524 m)  Wt 127 lb (57.607 kg)  BMI 24.80 kg/m2  SpO2 96%    GEN: WDWN, NAD, Non-toxic, A & O x 3 HEENT: Atraumatic, Normocephalic. Neck supple. No masses, No LAD. Ears and Nose: No external deformity. CV: RRR, No M/G/R. No JVD. No thrill. No extra  heart sounds. PULM: CTA B, minimal wheezes, no crackles, rhonchi. No retractions. No resp. distress. No accessory muscle use. EXTR: No c/c/e NEURO Normal gait. Slightly unsteady with walking.  Strength is intact and at baseline.  Vision is mildly blurry bilaterally. PSYCH: Normally interactive. Conversant. Not depressed or anxious appearing.  Calm demeanor.      Dg Hand Complete Left  04/30/2014   CLINICAL DATA:  Fall today, left hand pain in the fourth and fifth digits, initial encounter  EXAM: LEFT HAND - COMPLETE 3+ VIEW  COMPARISON:  None.  FINDINGS: Significant degenerative changes are noted in the first [facility] joint. Mild degenerative changes are noted in the interphalangeal joints. No acute fracture or  dislocation is seen. No gross soft tissue abnormality is noted.  IMPRESSION: Degenerative change without acute abnormality.   Electronically Signed   By: Alcide Clever M.D.   On: 04/30/2014 16:27       Assessment & Plan:   Concussion with loss of consciousness, 30 minutes or less, initial encounter  Left hand pain  Fall at home, subsequent encounter  Laceration of nose without complication, subsequent encounter  >25 minutes spent in face to face time with patient, >50% spent in counselling or coordination of care: I am not concerned about her hand or the healing laceration and some of the small trauma issues.  I think that she does have a concussion, with or without consciousness loss.  She does still appear to be symptomatic today with headache, some blurred vision, no other symptoms.  Increase activity seemed to make it worse.  Recommended full physical and cognitive rest throughout the rest of the week.  If she is feeling better, recommended that she try to go to church on Sunday.  No physical activity for greater than 7 days.  Signed,  Elpidio Galea. Cheynne Virden, MD   Patient's Medications  New Prescriptions   No medications on file  Previous Medications   ALBUTEROL (PROAIR HFA) 108 (90 BASE) MCG/ACT INHALER    Inhale 2 puffs into the lungs 4 (four) times daily.   ALBUTEROL (PROVENTIL) (5 MG/ML) 0.5% NEBULIZER SOLUTION    Take 0.5 mLs (2.5 mg total) by nebulization every 6 (six) hours as needed for wheezing.   ASPIRIN EC 81 MG TABLET    Take 1 tablet (81 mg total) by mouth daily.   ATORVASTATIN (LIPITOR) 40 MG TABLET    Take 40 mg by mouth every morning.   CYCLOBENZAPRINE (FLEXERIL) 5 MG TABLET    Take 1-2 tablets (5-10 mg total) by mouth at bedtime.   FLUTICASONE-SALMETEROL (ADVAIR DISKUS) 500-50 MCG/DOSE AEPB    Inhale 1 puff into the lungs 2 (two) times daily.   ISOSORBIDE MONONITRATE (IMDUR) 30 MG 24 HR TABLET    Take 30 mg by mouth daily.   LEVOFLOXACIN (LEVAQUIN) 500 MG TABLET    Take  1 tablet (500 mg total) by mouth daily.   MAGNESIUM OXIDE (MAG-OX) 400 MG TABLET    Take 400 mg by mouth at bedtime.    METOPROLOL TARTRATE (LOPRESSOR) 25 MG TABLET    Take 25 mg by mouth 2 (two) times daily.   MORPHINE (KADIAN) 30 MG 24 HR CAPSULE    Take 30 mg by mouth every 12 (twelve) hours.   MORPHINE (MS CONTIN) 15 MG 12 HR TABLET    Take 15 mg by mouth every 12 (twelve) hours.   NITROGLYCERIN (NITROSTAT) 0.3 MG SL TABLET    Place 1 tablet (0.3 mg total) under the tongue  every 5 (five) minutes as needed for chest pain. Max 3 per day   OMEGA-3 FATTY ACIDS (FISH OIL) 1000 MG CAPS    Take 1 capsule by mouth daily.   OXYCODONE (OXYCONTIN) 20 MG T12A 12 HR TABLET    Take 20 mg by mouth every 12 (twelve) hours.   POTASSIUM CHLORIDE SA (K-DUR,KLOR-CON) 20 MEQ TABLET    Take 1 tablet (20 mEq total) by mouth daily.   PSYLLIUM ([employer] FIBER) 0.52 G CAPSULE    Take 0.52 g by mouth daily.   RAMIPRIL (ALTACE) 10 MG CAPSULE    Take 1 capsule (10 mg total) by mouth daily.   TIOTROPIUM (SPIRIVA) 18 MCG INHALATION CAPSULE    Place 1 capsule (18 mcg total) into inhaler and inhale once.  Modified Medications   No medications on file  Discontinued Medications   No medications on file

## 2014-05-03 NOTE — Patient Instructions (Signed)
HEAD INJURY / CONCUSSSION:   MOST PEOPLE RECOVER FINE AND COMPLETELY FROM A CONCUSSION, BUT THE MOST IMPORTANT THING IS VERY EARLY COMPLETE REST SO THAT THE BRAN CAN RECOVER.  COMPLETE PHYSICAL AND MENTAL REST IS NEEDED.  THAT MEANS: NO SCHOOL OR WORK UNTIL YOU ARE BETTER NO PHYSICAL EXERTION AT ALL UNTIL YOU HAVE NO SYMPTOMS NO MENTAL EXERTION, MEANING NO WORK, NO HOMEWORK, NO TEST TAKING.  NO DRIVING UNTIL YOU ARE ASYMPTOMATIC.  NO VIDEO GAMES, NO USING THE COMPUTER, NO TEXTING, NO USING SMARTPHONES, NO USE OF AN IPAD OR TABLET. DO NOT GO TO A MOVIE THEATRE OR WATCH SPORTS ON TV. HDTV TENDS TO MAKE PEOPLE FEEL WORSE.   IN OTHER WORDS, DO NOT DO ANYTHING. SIT AND CALMLY REST UNTIL YOU FEEL BETTER. SLEEP IS OK. YOU CAN HANG OUT AND TALK TO A FRIEND.  IT IS DIFFICULT TO KNOW HOW QUICKLY YOU WILL RECOVER. SOME PEOPLE FEEL BETTER IN A FEW DAYS, WHILE OTHER PEOPLE HAVE SYMPTOMS THAT CAN LAST FOR Rini TO MONTHS.  EARLY REST IS BY FAR THE MOST IMPORTANT THING.  If any of the following occur notify your physician or go to the Hospital Emergency Department - if markedly worsening:  . Increased drowsiness, stupor or loss of consciousness . Restlessness or convulsions (fits) . Paralysis in arms or legs . Temperature above 100 F . Vomiting . Severe headache . Blood or clear fluid dripping from the nose or ears . Stiffness of the neck . Dizziness or blurred vision . Pulsating pain in the eye . Unequal pupils of eye . Personality changes . Any other unusual symptoms  PRECAUTIONS . Keep head elevated at all times for the first 24 hours (Elevate mattress if pillow is ineffective) . Do not take tranquilizers, sedatives, narcotics or alcohol . Avoid aspirin. Use only acetaminophen (e.g. Tylenol) or ibuprofen (e.g. Advil) for relief of pain. Follow directions on the bottle for dosage. . Use ice packs for comfort  MEDICATIONS Use medications only as directed by your  physician  Concussion Direct trauma to the head often causes a condition known as a concussion. This injury will interfere with brain function and may cause you to lose consciousness. The consequences of a concussion are usually temporary, but repetitive concussions can be very dangerous. If you have multiple concussions, you will have a greater risk of long-term effects, such as slurred speech, slow movements, impaired thinking, or tremors. The severity of a concussion is based on the length and severity of the interference with brain activity.  SYMPTOMS  Symptoms of a concussion vary depending on the severity of the injury. Very mild concussions may even occur without any noticeable symptoms. Swelling in the area of the injury is not related to the seriousness of the injury.   CAUSES  A concussion is the result of trauma to the head. When the head is subjected to such an injury, the brain strikes against the inner wall of the skull. This impact is what causes the damage to the brain. The force of injury is related to severity of injury. The most severe concussions are associated with incidents that involve large impact forces such as motor vehicle accidents. Wearing a helmet will reduce the severity of trauma to the head, but concussions may still occur if you are wearing a helmet.  RISK INCREASES WITH:  Contact sports (football, hockey, rugby, or lacrosse).  Fighting sports (martial arts or boxing).  Riding bicycles, motorcycles, or horses (when you ride without a helmet).     PREVENTION  Wear proper protective headgear and ensure correct fit.  Wear seat belts when driving and riding in a car.  Do not drink or use mind-altering drugs and drive.   PROGNOSIS  Concussions are typically curable if they are recognized and treated early. If a severe concussion or multiple concussions go untreated, then the complications may be life-threatening or cause permanent disability and brain  damage.  RELATED COMPLICATIONS   Permanent brain damage (slurred speech, slow movement, impaired thinking, or tremors).  Bleeding under the skull (subdural hemorrhage or hematoma, epidural hematoma).  Bleeding into the brain.  Prolonged healing time if usual activities are resumed too soon.  Infection if skin over the concussion site is broken.  Increased risk of future concussions (less trauma is required for a second concussion than the first).  

## 2014-05-03 NOTE — Progress Notes (Signed)
Pre visit review using our clinic review tool, if applicable. No additional management support is needed unless otherwise documented below in the visit note. 

## 2014-05-04 ENCOUNTER — Ambulatory Visit (INDEPENDENT_AMBULATORY_CARE_PROVIDER_SITE_OTHER): Payer: Medicare Other | Admitting: Nurse Practitioner

## 2014-05-04 ENCOUNTER — Encounter: Payer: Self-pay | Admitting: Nurse Practitioner

## 2014-05-04 VITALS — BP 120/58 | HR 68 | Ht 60.0 in | Wt 127.8 lb

## 2014-05-04 DIAGNOSIS — I480 Paroxysmal atrial fibrillation: Secondary | ICD-10-CM

## 2014-05-04 DIAGNOSIS — I5032 Chronic diastolic (congestive) heart failure: Secondary | ICD-10-CM

## 2014-05-04 DIAGNOSIS — I2510 Atherosclerotic heart disease of native coronary artery without angina pectoris: Secondary | ICD-10-CM

## 2014-05-04 DIAGNOSIS — I10 Essential (primary) hypertension: Secondary | ICD-10-CM

## 2014-05-04 DIAGNOSIS — W19XXXD Unspecified fall, subsequent encounter: Secondary | ICD-10-CM

## 2014-05-04 NOTE — Patient Instructions (Signed)
Stay on your current medicines  I did take the Ramipril off your list  See me in 4 months with fasting labs  Stay safe!!  Call the [facility] office at [phone] if you have any questions, problems or concerns.

## 2014-05-04 NOTE — Progress Notes (Signed)
CARDIOLOGY OFFICE NOTE  Date:  05/04/2014    [name] Date of Birth: 06/06/1931 Medical Record #[id number]  PCP:  Hannah Beat, MD  Cardiologist:  Shirlee Latch    Chief Complaint  Patient presents with  . Coronary Artery Disease    Follow up visit - seen for Dr. Shirlee Latch     History of Present Illness: [name] is a 81 y.o. female who presents today for a 5 month follow up visit. Seen for Dr. Shirlee Latch. She has a history of COPD, HTN, diabetes, and CAD. Patient was admitted to [facility] with chest pain in 8/12. Troponin was mildly elevated. Left heart cath showed severe, diffuse disease in a small RCA that was not amenable to intervention. This was the likely culprit vessel. She also had a 70% LAD stenosis. [name] was then done to assess for ischemia in the LAD distribution. This showed only a fixed apical perfusion defect with no ischemia (low risk). She was admitted to [facility] in 1/13 with a lower GI bleed that was thought to be diverticular. She required blood transfusion. Due to ongoing episodes of chest pain, she had LHC in 10/13, and she ended up having CABG x 4 per EBG. She did well post-op and was sent to a nursing home. Readmitted in 11/13 with acute on chronic diastolic CHF and an an acute exacerbation of COPD. She was diuresed and treated with antibiotics. Last echo in 5/14 showed EF 55-60%.   Last seen by me back in September. Had had a lone episode of AF and went to [facility] - converted on her own and sounded like it was triggered by bronchitis. Echo updated and showed normal EF with mild LAE. Only on aspirin for her anticoagulation per her wishes.   Comes back today. Here alone. She says she has been doing well. No chest pain. Says her breathing is good. Not dizzy. Tries to remain very active with church. Admits that she is lonely. Larey Seat this past Friday - got tangled in her dog's leash - went down and hit her face - has seen PCP on Friday and yesterday - probably with  mild concussion. "Brief instance" of LOC. She tells me she is not having headaches. No N/V. Some weakness. Tells me she has not taken her Ramipril in "forever". Her rhythm has been ok - no more atrial fib.    Past Medical History  Diagnosis Date  . Hypertension 08/01/1995  . Rheumatoid arthritis(714.0) 04/03/1983  . Osteoarthritis 04/03/1983  . Anemia, deficiency 07/01/2004    (w/u by Dr. Lorre Nick)  . Diabetes mellitus type II 07/31/1997  . Osteoporosis 12/31/2000  . CAD (coronary artery disease) 09/01/2002    a. s/p NSTEMI 2012. b. s/p CABG 01/2012.  [name] Hyperlipemia 07/02/1999  . COPD (chronic obstructive pulmonary disease) 05/31/2004  . diverticulosis of colon   . Arteriovenous malformation of gastrointestinal tract   . Fatty liver   . Chronic back pain   . Splenic artery aneurysm   . Anxiety and depression   . GI bleed     a. 04/2011 felt diverticular requiring microembolization by vascular.  . Adrenal mass     a. Excision 2007.  [name] Restless leg syndrome   . C. difficile colitis     a. 04/2008 = severe.  [name] Hx of CABG 04/18/2012  . Diastolic CHF     a. 02/2012: resp failure with COPD exac/CHF/pleural eff.;  b.  Echocardiogram 08/29/12: EF 55-60%, mild LVH, mild LAE, impaired  LV relaxation  . PAF (paroxysmal atrial fibrillation) 12/16/2013  . Status post replacement of both shoulder joints 02/09/2014   PMH:  1. COPD  2. HTN  3. Diabetes mellitus type II  4. ? AAA: Patient says she was told by [name] that she has an aneurysm. Abdominal US (10/12) showed no AAA.  5. Chronic low back pain  6. Osteoarthritis: hip pain  7. Anemia  8. Excision of adrenal mass in 2007  9. Restless leg syndrome.  10. Bilateral shoulder replacements.  11. CAD: Cath in 2004 with moderate nonobstructive disease. Patient was admitted to [facility] in 8/12 with NSTEMI. LHC (8/12) showed severe diffuse disease of a small RCA. This was the culprit lesion but it was too small and diffusely diseased  for intervention. There was a 70% proximal LAD stenosis. Lexiscan myoview (9/12) to assess for LAD territory ischemia showed a small fixed apical defect, low risk. EF 65% by myoview. CABG 10/13 with LIMA-LAD, SVG-D, SVG-PLOM, and SVG-RCA. Echo (11/13): EF 55-60%, grade I diastolic dysfunction, PA systolic pressure 38 mmHg.  12. Diverticular bleed 1/13 requiring microembolization by vascular surgery. Recurrent diverticular bleeding in 5/14.  13. ABIs (2/13) within normal limits.  14. Chronic diastolic CHF: Last echo (5/14) with EF 55-60%, mild LVH.    Past Surgical History  Procedure Laterality Date  . Nasal sinus surgery    . Inguinal hernia repair    . Partial hysterectomy      ovaries intact, dysmennorhea w/ A/P repari  . Carpal tunnel release      bilat  . Trigger finger release      right  . Wrist surgery      neuroma repair, right  . Ankle surgery      neuroma repair, right ORIF  . Total shoulder replacement  12/13/1998    right, ([place])  . Rotator cuff repair  11/22/2003    ([place]) left  . Epidural block injection  05/2005    x 3  . Long finger tendon realignment  10/31/2005    [place]  . Lumbar epidural  06/13/2006  . Laminectomy  06/2006    L4/5 spinal stenosis (Dr. [name])  . Laminectomy  02/24/2008    L3/4 Ant/Lat interbody fusion and Lat Artthrodesis w/plate using XLIF (Dr. [name])  . Colonic embolization  04/18/2011    Dr. Wyn Quaker, acute GI bleed  . Spinal cord stimulator implant    . Coronary artery bypass graft  01/22/2012    Procedure: CORONARY ARTERY BYPASS GRAFTING (CABG);  Surgeon: Delight Ovens, MD;  Location[facility];  Service: Open Heart Surgery;  Laterality: N/A;  Coronary Artery Bypass Graft times four utilizing the left internal mammary artery and the left greater saphenous vein harvested endoscopically.  Rhae Hammock without cardioversion  01/22/2012    Procedure: TRANSESOPHAGEAL ECHOCARDIOGRAM (TEE);  Surgeon: Delight Ovens, MD;  Location: [facility] OR;  Service:  Open Heart Surgery;  Laterality: N/A;     Medications: Current Outpatient Prescriptions  Medication Sig Dispense Refill  . albuterol (PROAIR HFA) 108 (90 BASE) MCG/ACT inhaler Inhale 2 puffs into the lungs 4 (four) times daily.    [name] albuterol (PROVENTIL) (5 MG/ML) 0.5% nebulizer solution Take 0.5 mLs (2.5 mg total) by nebulization every 6 (six) hours as needed for wheezing. 20 mL 12  . aspirin EC 81 MG tablet Take 1 tablet (81 mg total) by mouth daily. 90 tablet 3  . atorvastatin (LIPITOR) 40 MG tablet Take 40 mg by mouth every morning.    [name]  cyclobenzaprine (FLEXERIL) 5 MG tablet Take 1-2 tablets (5-10 mg total) by mouth at bedtime. 30 tablet 1  . Fluticasone-Salmeterol (ADVAIR DISKUS) 500-50 MCG/DOSE AEPB Inhale 1 puff into the lungs 2 (two) times daily. 60 each 11  . isosorbide mononitrate (IMDUR) 30 MG 24 hr tablet Take 30 mg by mouth daily.    [name] levofloxacin (LEVAQUIN) 500 MG tablet Take 1 tablet (500 mg total) by mouth daily. 7 tablet 0  . magnesium oxide (MAG-OX) 400 MG tablet Take 400 mg by mouth at bedtime.     . metoprolol tartrate (LOPRESSOR) 25 MG tablet Take 25 mg by mouth 2 (two) times daily.    [name] morphine (KADIAN) 30 MG 24 hr capsule Take 30 mg by mouth every 12 (twelve) hours.    [name] morphine (MS CONTIN) 15 MG 12 hr tablet Take 15 mg by mouth every 12 (twelve) hours.    . nitroGLYCERIN (NITROSTAT) 0.3 MG SL tablet Place 1 tablet (0.3 mg total) under the tongue every 5 (five) minutes as needed for chest pain. Max 3 per day 50 tablet 3  . Omega-3 Fatty Acids (FISH OIL) 1000 MG CAPS Take 1 capsule by mouth daily.    . OxyCODONE (OXYCONTIN) 20 mg T12A 12 hr tablet Take 20 mg by mouth every 12 (twelve) hours.    . potassium chloride SA (K-DUR,KLOR-CON) 20 MEQ tablet Take 1 tablet (20 mEq total) by mouth daily. 90 tablet 3  . psyllium ([employer] FIBER) 0.52 G capsule Take 0.52 g by mouth daily.    [name] tiotropium (SPIRIVA) 18 MCG inhalation capsule Place 1 capsule (18 mcg total) into inhaler  and inhale once. 30 capsule 11   No current facility-administered medications for this visit.    Allergies: Allergies  Allergen Reactions  . Other Diarrhea, Rash and Other (See Comments)    Uncoded Allergy. Allergen: mycins  Reaction: Sore tongue, raw mouth, weakness  . Penicillins Other (See Comments)    Other Reaction: NEAR SYNCOPE  . Sulfa Antibiotics Shortness Of Breath  . Morphine Other (See Comments)    Other Reaction: GI UPSET  . Morphine Sulfate Nausea And Vomiting    At high doses hallucinations Can take in low doses  . Macrolides And Ketolides Diarrhea and Nausea And Vomiting    Only causes GI distress at extremely high doses per patient  . Iron Diarrhea  . Naproxen Rash    Social History: The patient  reports that she quit smoking about 21 months ago. Her smoking use included Cigarettes. She has a 5 pack-year smoking history. She has quit using smokeless tobacco. She reports that she drinks alcohol. She reports that she does not use illicit drugs.   Family History: The patient's family history includes Alcohol abuse in her father; Dementia in her mother; Diabetes in her brother; Heart disease in her father; Hypotension in her mother.   Review of Systems: Please see the history of present illness.   Otherwise, the review of systems is positive for visual disturbance and chronic back pain.   All other systems are reviewed and negative.   Physical Exam: VS:  BP 120/58 mmHg  Pulse 68  Ht 5' (1.524 m)  Wt 127 lb 12.8 oz (57.97 kg)  BMI 24.96 kg/m2  SpO2 95% .  BMI Body mass index is 24.96 kg/(m^2).  Wt Readings from Last 3 Encounters:  05/04/14 127 lb 12.8 oz (57.97 kg)  05/03/14 127 lb (57.607 kg)  04/28/14 130 lb 12 oz (59.308 kg)   Her  weight is down 12 pounds since I last saw her in September.  General: Pleasant. Elderly female in no acute distress. Diffuse bruising going down the right eye/mouth.  HEENT: Normal. Neck: Supple, no JVD, carotid bruits, or  masses noted.  Cardiac: Regular rate and rhythm. No murmurs, rubs, or gallops. No edema.  Respiratory:  Lungs are coarse bilaterally with normal work of breathing.  GI: Soft and nontender.  MS: No deformity or atrophy. Gait and ROM intact.She is kyphotic. Using a cane.  Skin: Warm and dry. Color is normal.  Neuro:  Strength and sensation are intact and no gross focal deficits noted.  Psych: Alert, appropriate and with normal affect. She does seem lonely.    LABORATORY DATA:  EKG:  EKG is not ordered today.   Lab Results  Component Value Date   WBC 9.1 06/11/2013   HGB 11.3* 06/11/2013   HCT 33.8* 06/11/2013   PLT 189.0 06/11/2013   GLUCOSE 111* 12/24/2013   CHOL 117 10/23/2013   TRIG 59.0 10/23/2013   HDL 31.30* 10/23/2013   LDLCALC 74 10/23/2013   ALT 13 10/23/2013   AST 18 10/23/2013   NA 140 12/24/2013   K 4.1 12/24/2013   CL 105 12/24/2013   CREATININE 1.0 12/24/2013   BUN 25* 12/24/2013   CO2 30 12/24/2013   TSH 1.28 02/08/2011   INR 1.52* 01/22/2012   HGBA1C 5.7* 01/18/2012   MICROALBUR 0.5 07/19/2011    BNP (last 3 results) No results for input(s): BNP in the last 8760 hours.  ProBNP (last 3 results)  Recent Labs  06/11/13 1253  PROBNP 338.0*     Other Studies Reviewed Today:   Assessment/Plan: 1. PAF - has not recurred. Only on aspirin and fortunately just aspirin with this recent fall.  2. CAD - No active symptoms -  Would manage medically.  3. Diastolic HF - compensated at this time - no longer on her ACE. BP is on the soft side - would hold on restarting for now.   4. Back pain - this is her most limiting factor - followed in the pain clinic.  5. HTN - BP on the softer side - she has been off her ACE "forever". Would hold on restarting. She has lost weight as well.  6. HLD - not fasting today. Recheck fasting labs on return.   7. Recent fall - followed by PCP.    Current medicines are reviewed with the patient today.  The patient does  not have concerns regarding medicines other than what was noted above.  The following changes have been made:  N/A Labs/ tests ordered today include:   No orders of the defined types were placed in this encounter.    Disposition:   FU with me in 4 months  Patient is agreeable to this plan and will call if any problems develop in the interim.   Signed: Rosalio Macadamia, RN, ANP-C 05/04/2014 1:54 PM  [facility] [address] [place], [place]  [address] Phone: [phone] Fax: [phone]

## 2014-05-05 ENCOUNTER — Ambulatory Visit: Payer: Medicare Other | Admitting: Family Medicine

## 2014-05-10 ENCOUNTER — Other Ambulatory Visit: Payer: Self-pay | Admitting: Cardiology

## 2014-05-12 ENCOUNTER — Encounter: Payer: Self-pay | Admitting: Family Medicine

## 2014-05-12 ENCOUNTER — Ambulatory Visit (INDEPENDENT_AMBULATORY_CARE_PROVIDER_SITE_OTHER): Payer: Medicare Other | Admitting: Family Medicine

## 2014-05-12 VITALS — BP 150/70 | HR 85 | Temp 97.5°F | Ht 60.0 in | Wt 127.5 lb

## 2014-05-12 DIAGNOSIS — I2510 Atherosclerotic heart disease of native coronary artery without angina pectoris: Secondary | ICD-10-CM

## 2014-05-12 DIAGNOSIS — N3001 Acute cystitis with hematuria: Secondary | ICD-10-CM

## 2014-05-12 LAB — POCT URINALYSIS DIPSTICK
Bilirubin, UA: NEGATIVE
Glucose, UA: NEGATIVE
KETONES UA: NEGATIVE
Nitrite, UA: NEGATIVE
PH UA: 6
UROBILINOGEN UA: 0.2

## 2014-05-12 MED ORDER — LEVOFLOXACIN 500 MG PO TABS: 500.0000 mg | ORAL_TABLET | Freq: Every day | ORAL | Status: DC

## 2014-05-12 NOTE — Progress Notes (Signed)
Pre visit review using our clinic review tool, if applicable. No additional management support is needed unless otherwise documented below in the visit note. 

## 2014-05-12 NOTE — Progress Notes (Signed)
Dr. Karleen Hampshire T. Marri Mcneff, MD, CAQ Sports Medicine Primary Care and Sports Medicine [address] [place], [address] Phone: [phone] Fax: [phone]  05/12/2014  Patient: [name], MRN: [id number], DOB: [birth year], 81 y.o.  Primary Physician:  Hannah Beat, MD  Chief Complaint: Urinary pressure  Subjective:   [name] is a 81 y.o. very pleasant female patient who presents with the following:  UTI. Pleasant elderly woman with multiple medical problems known well to me.  She had a UTI approximately 10 days ago, and I gave her 7 days of Levaquin.  At the time, she was unable to produce enough urine to get a urine culture.  She felt a lot better after 7 days, but over the last 2 or 3 days she has had return of symptoms.  Past Medical History, Surgical History, Social History, Family History, Problem List, Medications, and Allergies have been reviewed and updated if relevant.  ROS: GEN: Acute illness details above GI: Tolerating PO intake GU: maintaining adequate hydration and urination Pulm: No SOB Interactive and getting along well at home.  Otherwise, ROS is as per the HPI.   Objective:   BP 150/70 mmHg  Pulse 85  Temp(Src) 97.5 F (36.4 C) (Oral)  Ht 5' (1.524 m)  Wt 127 lb 8 oz (57.834 kg)  BMI 24.90 kg/m2   GEN: WDWN, A&Ox4,NAD. Non-toxic HEENT: Atraumatc, normocephalic. CV: RRR, No M/G/R PULM: scattered wheezing.  No rhonchi. ABD: S, NT, ND, +BS, no rebound. No CVAT. mild suprapubic tenderness. EXT: No c/c/e    Laboratory and Imaging Data: Results for orders placed or performed in visit on 05/12/14  POCT urinalysis dipstick  Result Value Ref Range   Color, UA yellow    Clarity, UA cloudy    Glucose, UA negative    Bilirubin, UA negative    Ketones, UA negative    Spec Grav, UA >=1.030    Blood, UA small    pH, UA 6.0    Protein, UA 2+    Urobilinogen, UA 0.2    Nitrite, UA negative    Leukocytes, UA small (1+)       Assessment and Plan:   Acute cystitis with hematuria - Plan: POCT urinalysis dipstick  Refill LVQ x 10 d  Follow-up: No Follow-up on file.  New Prescriptions   AMLODIPINE (NORVASC) 5 MG TABLET    TAKE ONE TABLET BY MOUTH EVERY DAY   Orders Placed This Encounter  Procedures  . POCT urinalysis dipstick    Signed,  Karleen Hampshire T. Adriene Knipfer, MD   Patient's Medications  New Prescriptions   AMLODIPINE (NORVASC) 5 MG TABLET    TAKE ONE TABLET BY MOUTH EVERY DAY  Previous Medications   ALBUTEROL (PROAIR HFA) 108 (90 BASE) MCG/ACT INHALER    Inhale 2 puffs into the lungs 4 (four) times daily.   ALBUTEROL (PROVENTIL) (5 MG/ML) 0.5% NEBULIZER SOLUTION    Take 0.5 mLs (2.5 mg total) by nebulization every 6 (six) hours as needed for wheezing.   ASPIRIN EC 81 MG TABLET    Take 1 tablet (81 mg total) by mouth daily.   ATORVASTATIN (LIPITOR) 40 MG TABLET    Take 40 mg by mouth every morning.   CYCLOBENZAPRINE (FLEXERIL) 5 MG TABLET    Take 1-2 tablets (5-10 mg total) by mouth at bedtime.   FLUTICASONE-SALMETEROL (ADVAIR DISKUS) 500-50 MCG/DOSE AEPB    Inhale 1 puff into the lungs 2 (two) times daily.   ISOSORBIDE MONONITRATE (IMDUR) 30  MG 24 HR TABLET    Take 30 mg by mouth daily.   MAGNESIUM OXIDE (MAG-OX) 400 MG TABLET    Take 400 mg by mouth at bedtime.    METOPROLOL TARTRATE (LOPRESSOR) 25 MG TABLET    Take 25 mg by mouth 2 (two) times daily.   MORPHINE (KADIAN) 30 MG 24 HR CAPSULE    Take 30 mg by mouth every 12 (twelve) hours.   MORPHINE (MS CONTIN) 15 MG 12 HR TABLET    Take 15 mg by mouth every 12 (twelve) hours.   NITROGLYCERIN (NITROSTAT) 0.3 MG SL TABLET    Place 1 tablet (0.3 mg total) under the tongue every 5 (five) minutes as needed for chest pain. Max 3 per day   OMEGA-3 FATTY ACIDS (FISH OIL) 1000 MG CAPS    Take 1 capsule by mouth daily.   OXYCODONE (OXYCONTIN) 20 MG T12A 12 HR TABLET    Take 20 mg by mouth every 12 (twelve) hours.   POTASSIUM CHLORIDE SA (K-DUR,KLOR-CON) 20 MEQ  TABLET    Take 1 tablet (20 mEq total) by mouth daily.   PSYLLIUM ([employer]S FIBER) 0.52 G CAPSULE    Take 0.52 g by mouth daily.   TIOTROPIUM (SPIRIVA) 18 MCG INHALATION CAPSULE    Place 1 capsule (18 mcg total) into inhaler and inhale once.  Modified Medications   Modified Medication Previous Medication   LEVOFLOXACIN (LEVAQUIN) 500 MG TABLET levofloxacin (LEVAQUIN) 500 MG tablet      Take 1 tablet (500 mg total) by mouth daily.    Take 1 tablet (500 mg total) by mouth daily.  Discontinued Medications   No medications on file

## 2014-05-13 ENCOUNTER — Ambulatory Visit: Payer: Medicare Other | Admitting: Family Medicine

## 2014-06-16 ENCOUNTER — Encounter: Payer: Self-pay | Admitting: Family Medicine

## 2014-06-16 ENCOUNTER — Ambulatory Visit (INDEPENDENT_AMBULATORY_CARE_PROVIDER_SITE_OTHER): Payer: Medicare Other | Admitting: Family Medicine

## 2014-06-16 ENCOUNTER — Telehealth: Payer: Self-pay | Admitting: Family Medicine

## 2014-06-16 VITALS — BP 137/66 | HR 68 | Temp 98.5°F | Ht 60.0 in | Wt 116.5 lb

## 2014-06-16 DIAGNOSIS — I10 Essential (primary) hypertension: Secondary | ICD-10-CM | POA: Diagnosis not present

## 2014-06-16 DIAGNOSIS — J439 Emphysema, unspecified: Secondary | ICD-10-CM

## 2014-06-16 DIAGNOSIS — I2510 Atherosclerotic heart disease of native coronary artery without angina pectoris: Secondary | ICD-10-CM

## 2014-06-16 NOTE — Progress Notes (Signed)
Pre visit review using our clinic review tool, if applicable. No additional management support is needed unless otherwise documented below in the visit note. 

## 2014-06-16 NOTE — Telephone Encounter (Signed)
Pt called to inform you she has one more refill on her inhaler until Nov 2016.

## 2014-06-16 NOTE — Progress Notes (Signed)
Dr. Karleen Hampshire T. Tamarius Rosenfield, MD, CAQ Sports Medicine Primary Care and Sports Medicine [address] [place], [address] Phone: [phone] Fax: [phone]  06/16/2014  Patient: [name], MRN: [id number], DOB: [birth year], 81 y.o.  Primary Physician:  Hannah Beat, MD  Chief Complaint: Follow-up  Subjective:   [name] is a 81 y.o. very pleasant female patient who presents with the following:  Immunization History  Administered Date(s) Administered  . Influenza Split 02/08/2011, 12/20/2011, 11/24/2012  . Influenza Whole 12/31/2005, 01/21/2008, 03/02/2009, 01/19/2010  . Influenza, High Dose Seasonal PF 12/08/2013  . Pneumococcal Conjugate-13 10/30/2013  . Pneumococcal Polysaccharide-23 05/31/2004, 12/02/2012  . Td 04/06/1998, 07/31/2011    R wrist - 4th.  Wt Readings from Last 3 Encounters:  06/16/14 116 lb 8 oz (52.844 kg)  05/12/14 127 lb 8 oz (57.834 kg)  05/04/14 127 lb 12.8 oz (57.97 kg)    Has lost some weight.  Not using COPD meds. Thinks she is out.   No CP. No SOB. Feeling better.  Frustrated had to stop [employer].  Past Medical History, Surgical History, Social History, Family History, Problem List, Medications, and Allergies have been reviewed and updated if relevant.   GEN: No acute illnesses, no fevers, chills. GI: No n/v/d, eating normally Pulm: No SOB Interactive and getting along well at home.  Otherwise, ROS is as per the HPI.  Objective:   BP 137/66 mmHg  Pulse 68  Temp(Src) 98.5 F (36.9 C) (Oral)  Ht 5' (1.524 m)  Wt 116 lb 8 oz (52.844 kg)  BMI 22.75 kg/m2  GEN: WDWN, NAD, Non-toxic, A & O x 3 HEENT: Atraumatic, Normocephalic. Neck supple. No masses, No LAD. Ears and Nose: No external deformity. CV: RRR, No M/G/R. No JVD. No thrill. No extra heart sounds. PULM: CTA B, no wheezes, crackles, rhonchi. No retractions. No resp. distress. No accessory muscle use. EXTR: No c/c/e NEURO Normal gait.  PSYCH: Normally  interactive. Conversant. Not depressed or anxious appearing.  Calm demeanor.   Laboratory and Imaging Data:  Assessment and Plan:   Pulmonary emphysema, unspecified emphysema type  Essential hypertension, benign  Work on eating, add ensure after meals  4th digit triggering and subluxation of dorsal tendon. Difficult to do anything about tendon.  Her memory is pretty good, but one of last ov, she couldn't remember which fingers were bothering her. I felt like 3rd had a more prominent nodule. Now she thinks it was her 4th. I went ahead and did a trigger finger injection of 4th in usual fashion on the R at no charge with 1/2 cc of lidocaine 1% and depomedrol 20 mg. No charge for procedure.   Follow-up: Return in about 6 months (around 12/17/2014).   Signed,  Elpidio Galea. Jontavia Leatherbury, MD   Patient's Medications  New Prescriptions   No medications on file  Previous Medications   ALBUTEROL (PROAIR HFA) 108 (90 BASE) MCG/ACT INHALER    Inhale 2 puffs into the lungs 4 (four) times daily.   ALBUTEROL (PROVENTIL) (5 MG/ML) 0.5% NEBULIZER SOLUTION    Take 0.5 mLs (2.5 mg total) by nebulization every 6 (six) hours as needed for wheezing.   AMLODIPINE (NORVASC) 5 MG TABLET    TAKE ONE TABLET BY MOUTH EVERY DAY   ASPIRIN EC 81 MG TABLET    Take 1 tablet (81 mg total) by mouth daily.   ATORVASTATIN (LIPITOR) 40 MG TABLET    Take 40 mg by mouth every morning.   CYCLOBENZAPRINE (  FLEXERIL) 5 MG TABLET    Take 1-2 tablets (5-10 mg total) by mouth at bedtime.   FLUTICASONE-SALMETEROL (ADVAIR DISKUS) 500-50 MCG/DOSE AEPB    Inhale 1 puff into the lungs 2 (two) times daily.   ISOSORBIDE MONONITRATE (IMDUR) 30 MG 24 HR TABLET    Take 30 mg by mouth daily.   MAGNESIUM OXIDE (MAG-OX) 400 MG TABLET    Take 400 mg by mouth at bedtime.    METOPROLOL TARTRATE (LOPRESSOR) 25 MG TABLET    Take 25 mg by mouth 2 (two) times daily.   MORPHINE (KADIAN) 30 MG 24 HR CAPSULE    Take 30 mg by mouth every 12 (twelve) hours.     MORPHINE (MS CONTIN) 15 MG 12 HR TABLET    Take 15 mg by mouth every 12 (twelve) hours.   NITROGLYCERIN (NITROSTAT) 0.3 MG SL TABLET    Place 1 tablet (0.3 mg total) under the tongue every 5 (five) minutes as needed for chest pain. Max 3 per day   OMEGA-3 FATTY ACIDS (FISH OIL) 1000 MG CAPS    Take 1 capsule by mouth daily.   OXYCODONE (OXYCONTIN) 20 MG T12A 12 HR TABLET    Take 20 mg by mouth every 12 (twelve) hours.   POTASSIUM CHLORIDE SA (K-DUR,KLOR-CON) 20 MEQ TABLET    Take 1 tablet (20 mEq total) by mouth daily.   PSYLLIUM ([employer]S FIBER) 0.52 G CAPSULE    Take 0.52 g by mouth daily.   TIOTROPIUM (SPIRIVA) 18 MCG INHALATION CAPSULE    Place 1 capsule (18 mcg total) into inhaler and inhale once.  Modified Medications   No medications on file  Discontinued Medications   CEFDINIR (OMNICEF) 300 MG CAPSULE       LEVOFLOXACIN (LEVAQUIN) 500 MG TABLET    Take 1 tablet (500 mg total) by mouth daily.

## 2014-06-17 MED ORDER — TIOTROPIUM BROMIDE MONOHYDRATE 18 MCG IN CAPS: 18.0000 ug | ORAL_CAPSULE | Freq: Once | RESPIRATORY_TRACT | Status: DC

## 2014-06-17 MED ORDER — ALBUTEROL SULFATE HFA 108 (90 BASE) MCG/ACT IN AERS: 2.0000 | INHALATION_SPRAY | Freq: Four times a day (QID) | RESPIRATORY_TRACT | Status: DC

## 2014-06-17 MED ORDER — ALBUTEROL SULFATE (5 MG/ML) 0.5% IN NEBU: 2.5000 mg | INHALATION_SOLUTION | Freq: Four times a day (QID) | RESPIRATORY_TRACT | Status: DC | PRN

## 2014-06-17 MED ORDER — FLUTICASONE-SALMETEROL 500-50 MCG/DOSE IN AEPB: 1.0000 | INHALATION_SPRAY | Freq: Two times a day (BID) | RESPIRATORY_TRACT | Status: DC

## 2014-06-17 NOTE — Telephone Encounter (Signed)
Left message for patient to call office to advise her of inhaler directions.  All were refilled.

## 2014-06-17 NOTE — Telephone Encounter (Signed)
Ok, she was a little confused about her inhalers yesterday.  Please help.  Refill   Spiriva, 1 puff daily, 1 year supply, 30 or 90 days, her choice  Advair, 1 puff twice a day, 1 year supply, 30 or 90 days, her choice.  -- These are the 2 that I want her to use every single day. I can promise she will feel better if she does.    Refill albuterol inhaler and nebulizer solution. 30 or 90 days. Same sig, 1 year supply.  -- ok to use up to 4 times a day if short of breath - as needed.

## 2014-06-18 ENCOUNTER — Other Ambulatory Visit: Payer: Self-pay | Admitting: Family Medicine

## 2014-06-18 NOTE — Telephone Encounter (Signed)
Received refill request electronically from pharmacy. Medication is no longer on the medication list. Is it okay to refill medication?

## 2014-06-21 NOTE — Telephone Encounter (Signed)
Chris notified  Cardiologist stopped this medication due to low blood pressures.  He ask that I call and speak with Ms. Hostler because she is still taking it.  Left message for Ms. Posada to return my call.

## 2014-06-21 NOTE — Telephone Encounter (Signed)
Chris with [facility] pharmacy [place] left v/m; Thayer Ohm said pt has been taking ramipril for the last year; last refill was started on 06/17/2013 and continued thru present. Chris request cb.

## 2014-06-22 NOTE — Telephone Encounter (Signed)
Left message for [name] to return my call.

## 2014-06-23 NOTE — Telephone Encounter (Signed)
Spoke with [name] to advise that her cardiologist had stopped the ramipril due to her blood pressure being too low.  She states she feels like she needs it because she can tell that sometimes her blood pressure is up.  I advised her to contact her cardiologist about restarting this medication.  Since they were the ones that stopped it, I would feel better if they refilled this medication if they want her to restart it.

## 2014-06-23 NOTE — Telephone Encounter (Signed)
Chart reviewed and at office visit in September Ramipril was on her list.  At office visit with Norma Fredrickson, NP in February 2016 pt reported she had not taken Ramipril "in forever". Instructions were given to stay off Ramipril due to blood pressure.  I spoke with pt who reports she has been taking Ramipril daily.  She states daughter has been checking her blood pressure and today it was 120/90.  No other readings were available. At office visit with Dr. Dallas Schimke on 06/16/14 BP was 137/66.  Pt will call daughter to get recent BP readings and call us back with readings tomorrow.  Will forward to Dr. Shirlee Latch for review when BP readings available. Pt also reports she has stopped taking her potassium.

## 2014-06-24 ENCOUNTER — Telehealth: Payer: Self-pay | Admitting: *Deleted

## 2014-06-24 DIAGNOSIS — I10 Essential (primary) hypertension: Secondary | ICD-10-CM

## 2014-06-24 MED ORDER — RAMIPRIL 2.5 MG PO CAPS: 2.5000 mg | ORAL_CAPSULE | Freq: Every day | ORAL | Status: DC

## 2014-06-24 NOTE — Telephone Encounter (Signed)
Advised per Dr. Shirlee Latch to restart Ramipril 2.5 mg daily. When she was talking to [name] last PM told her that she has been off KCl for a while.  Suggested that she come in to get BMET to check her sodium and potassium and kidney function. She will come in Monday 3/28.  She will call us the BP readings. Will send Rx for Ramipril to [facility]

## 2014-06-24 NOTE — Telephone Encounter (Signed)
error 

## 2014-06-28 ENCOUNTER — Other Ambulatory Visit (INDEPENDENT_AMBULATORY_CARE_PROVIDER_SITE_OTHER): Payer: Medicare Other | Admitting: *Deleted

## 2014-06-28 DIAGNOSIS — I10 Essential (primary) hypertension: Secondary | ICD-10-CM | POA: Diagnosis not present

## 2014-06-28 LAB — BASIC METABOLIC PANEL
BUN: 23 mg/dL (ref 6–23)
CO2: 32 meq/L (ref 19–32)
Calcium: 9.2 mg/dL (ref 8.4–10.5)
Chloride: 103 mEq/L (ref 96–112)
Creatinine, Ser: 0.7 mg/dL (ref 0.40–1.20)
GFR: 85.44 mL/min (ref >60.00)
GLUCOSE: 101 mg/dL — AB (ref 70–99)
Potassium: 3.7 mEq/L (ref 3.5–5.1)
Sodium: 140 mEq/L (ref 135–145)

## 2014-07-23 NOTE — Consult Note (Signed)
Brief Consult Note: Diagnosis: GI Bleed; Diverticular disease.   Patient was seen by consultant.   Discussed with Attending MD.   Comments: Given the history of previous embolization the patient is at high risk for colon necrosis if reembolization is performed.  Therefore, conservative measures should be adopted and surgery should evaluate the patient.  If it comes to the point that surgery is needed then embolization as a last resort before surgery would be appropriate.  Electronic Signatures: Levora Dredge (MD)  (Signed 29-May-14 08:14)  Authored: Brief Consult Note   Last Updated: 29-May-14 08:14 by Levora Dredge (MD)

## 2014-07-23 NOTE — Consult Note (Signed)
Chief Complaint:  Subjective/Chief Complaint Covering for Dr. Marva Panda. Some passage of old blood clots last night. No bleeding so far this AM. Wants to try solids.   VITAL SIGNS/ANCILLARY NOTES: **Vital Signs.:   31-May-14 08:09  Vital Signs Type Q 4hr  Temperature Temperature (F) 98.5  Celsius 36.9  Temperature Source oral  Pulse Pulse 66  Respirations Respirations 20  Systolic BP Systolic BP 148  Diastolic BP (mmHg) Diastolic BP (mmHg) 68  Mean BP 94  Pulse Ox % Pulse Ox % 97  Pulse Ox Activity Level  At rest  Oxygen Delivery Room Air/ 21 %   Brief Assessment:  GEN no acute distress   Cardiac Regular   Respiratory clear BS   Gastrointestinal mild low abd tenderness   Lab Results: Routine Hem:  31-May-14 04:17   WBC (CBC) 5.2  RBC (CBC)  2.88  Hemoglobin (CBC)  9.0  Hematocrit (CBC)  26.0  Platelet Count (CBC)  127  MCV 91  MCH 31.4  MCHC 34.7  RDW 14.4  Neutrophil % 53.9  Lymphocyte % 28.4  Monocyte % 10.6  Eosinophil % 6.5  Basophil % 0.6  Neutrophil # 2.8  Lymphocyte # 1.5  Monocyte # 0.5  Eosinophil # 0.3  Basophil # 0.0 (Result(s) reported on 30 Aug 2012 at 05:37AM.)   Assessment/Plan:  Assessment/Plan:  Assessment LGI bleeding. Likely diverticular. No active bleeding now.   Plan Advance to low residue diet and see if sxs worsen.   Electronic Signatures: Lutricia Feil (MD)  (Signed 31-May-14 10:19)  Authored: Chief Complaint, VITAL SIGNS/ANCILLARY NOTES, Brief Assessment, Lab Results, Assessment/Plan   Last Updated: 31-May-14 10:19 by Lutricia Feil (MD)

## 2014-07-23 NOTE — Consult Note (Signed)
Chief Complaint:  Subjective/Chief Complaint No bleeding yest but one bloody BM this AM. Tolerating solids.   VITAL SIGNS/ANCILLARY NOTES: **Vital Signs.:   01-Jun-14 08:09  Vital Signs Type Routine  Temperature Temperature (F) 98.9  Celsius 37.1  Temperature Source oral  Pulse Pulse 65  Respirations Respirations 20  Systolic BP Systolic BP 165  Diastolic BP (mmHg) Diastolic BP (mmHg) 75  Mean BP 105  Pulse Ox % Pulse Ox % 94  Pulse Ox Activity Level  With exertion  Oxygen Delivery 1L   Brief Assessment:  GEN no acute distress   Cardiac Regular   Respiratory clear BS   Gastrointestinal Normal   Lab Results: Routine Hem:  31-May-14 04:17   WBC (CBC) 5.2  RBC (CBC)  2.88  Hemoglobin (CBC)  9.0  Hematocrit (CBC)  26.0  Platelet Count (CBC)  127  MCV 91  MCH 31.4  MCHC 34.7  RDW 14.4  Neutrophil % 53.9  Lymphocyte % 28.4  Monocyte % 10.6  Eosinophil % 6.5  Basophil % 0.6  Neutrophil # 2.8  Lymphocyte # 1.5  Monocyte # 0.5  Eosinophil # 0.3  Basophil # 0.0 (Result(s) reported on 30 Aug 2012 at 05:37AM.)   Assessment/Plan:  Assessment/Plan:  Assessment LGI bleeding, likely diverticular. One bloody BM this AM.   Plan Will see how she does rest of today. Will check if bleeding recurs or not. Dr. Marva Panda will patient tomorrow. Thanks.   Electronic Signatures: Lutricia Feil (MD)  (Signed 01-Jun-14 09:23)  Authored: Chief Complaint, VITAL SIGNS/ANCILLARY NOTES, Brief Assessment, Lab Results, Assessment/Plan   Last Updated: 01-Jun-14 09:23 by Lutricia Feil (MD)

## 2014-07-23 NOTE — Consult Note (Signed)
Brief Consult Note: Diagnosis: Rectal bleeding.   Patient was seen by consultant.   Consult note dictated.   Comments: Appreciate consult for 81 y/o caucasion woman for evaluation of rectal bleeding. Patient states she was feeling well and spent the night with her son. When she awoke this am, she had blood on her gown and came to the ED.  described at bright red, was in large amounts at first, but has been decreasing over the day. Last episode about 1500.  She had a positive bleeding scan for bleeding in the distal descending/proximal sigmoid colon. A vascular consult is pending. States the amount of red blood  She has had one episode of this about a year ago that she recovered from well. Hgb is stable at 10.8; PT/INR normal.WBC 6.  Does have a UTi that antibiotics are ordered for. Denies abdominal pain, NVD, dyspepsia, further Gi complaints Impression and Plan: Lower GI bleed, diverticular in nature.Hemodynamically stable, has hgb reassessment soon.  Await vascular opinion on embolization. Do recommend serial hgb, transfusion prn. Further recommendations to follow..  Electronic Signatures: Keturah Barre (NP)  (Signed 28-May-14 17:45)  Authored: Brief Consult Note   Last Updated: 28-May-14 17:45 by Keturah Barre (NP)

## 2014-07-23 NOTE — Consult Note (Signed)
Chief Complaint:  Subjective/Chief Complaint seen for hematochezia-denies abdominalpain, one bm 2am   VITAL SIGNS/ANCILLARY NOTES: **Vital Signs.:   29-May-14 09:52  Vital Signs Type Pre-Blood  Temperature Temperature (F) 98.9  Celsius 37.1  Pulse Pulse 76  Respirations Respirations 15  Systolic BP Systolic BP 638  Diastolic BP (mmHg) Diastolic BP (mmHg) 72  Mean BP 84  Pulse Ox % Pulse Ox % 99  Oxygen Delivery 2L; Nasal Cannula  *Intake and Output.:   29-May-14 02:04  Stool  1 moderate amount of darker red stool, mixed with clots   Brief Assessment:  Cardiac Regular   Respiratory clear BS   Gastrointestinal details normal Soft  Nontender  Nondistended  Bowel sounds normal  No gaurding   Lab Results:  Routine Chem:  29-May-14 06:17   Glucose, Serum 96  BUN 10  Creatinine (comp) 0.69  Sodium, Serum 143  Potassium, Serum 3.6  Chloride, Serum  113  CO2, Serum 26  Calcium (Total), Serum  7.5  Anion Gap  4  Osmolality (calc) 284  eGFR (African American) >60  eGFR (Non-African American) >60 (eGFR values <70mL/min/1.73 m2 may be an indication of chronic kidney disease (CKD). Calculated eGFR is useful in patients with stable renal function. The eGFR calculation will not be reliable in acutely ill patients when serum creatinine is changing rapidly. It is not useful in  patients on dialysis. The eGFR calculation may not be applicable to patients at the low and high extremes of body sizes, pregnant women, and vegetarians.)  Cardiac:  29-May-14 11:27   CPK-MB, Serum 0.7 (Result(s) reported on 28 Aug 2012 at 12:00PM.)  Troponin I < 0.02 (0.00-0.05 0.05 ng/mL or less: NEGATIVE  Repeat testing in 3-6 hrs  if clinically indicated. >0.05 ng/mL: POTENTIAL  MYOCARDIAL INJURY. Repeat  testing in 3-6 hrs if  clinically indicated. NOTE: An increase or decrease  of 30% or more on serial  testing suggests a  clinically important change)  Routine Hem:  28-May-14 09:27    Hemoglobin (CBC)  10.8    16:09   Hemoglobin (CBC)  8.8 (Result(s) reported on 27 Aug 2012 at 04:32PM.)    21:17   Hemoglobin (CBC)  9.6 (Result(s) reported on 27 Aug 2012 at 09:38PM.)  29-May-14 06:17   WBC (CBC) 4.9  RBC (CBC)  2.57  Hemoglobin (CBC)  8.1  Hematocrit (CBC)  23.4  Platelet Count (CBC)  134  MCV 91  MCH 31.6  MCHC 34.8  RDW 14.4  Neutrophil % 59.3  Lymphocyte % 26.7  Monocyte % 8.1  Eosinophil % 5.1  Basophil % 0.8  Neutrophil # 2.9  Lymphocyte # 1.3  Monocyte # 0.4  Eosinophil # 0.2  Basophil # 0.0 (Result(s) reported on 28 Aug 2012 at 06:49AM.)   Radiology Results: Nuclear Med:    28-May-14 12:07, GI Blood Loss Study - Nuc Med  GI Blood Loss Study - Nuc Med   REASON FOR EXAM:    hematochezia, prio rembolization of ima and splenic a  COMMENTS:       PROCEDURE: NM  - NM GI BLOOD LOSS STUDY  - Aug 27 2012 12:07PM     RESULT: Nuclear medicine GI bleeding evaluation dated 08/27/2012    Comparison: This study was correlated with the previous study dated   04/17/2011.    Procedure:    Radiopharmaceutical: 21.69 mCi of technetium 61 M    Dosage: Intravenous administration of 3 mL of PYP labeled with 21.69 mCi  of technetium 51 M    IV site: Left hand    Standard scintigraphic imaging of the abdomen and pelvis was obtained.    Findings:    Expected bio distribution is appreciated region of the liver, spleen,   urinary bladder and within the vasculature.    Mobile free radiopharmaceutical is appreciated within the left lower   quadrant. This finding is consistent with an active GI hemorrhage   involving the distal descending colon in the proximal sigmoid colon.  IMPRESSION:   1. Exam positive for active GI hemorrhage with the expected source in the   region of the distal descending colon and/or proximal sigmoid colon.  2. Dr. Michel Santee of the emergency department was informed of these findings   at the time of initial  interpretation.        Verified By: Mikki Santee, M.D., MD   Assessment/Plan:  Assessment/Plan:  Assessment 1) hematochezia-likely diverticular   Plan 1) continue serial obs, transfuse as needed.   Electronic Signatures for Addendum Section:  Loistine Simas (MD) (Signed Addendum 29-May-14 13:42)  aontoer bm about 1pm, moderate, agreee with repeat tfz, continue serial hgb, surgery following.  continue conservate management.   Electronic Signatures: Loistine Simas (MD)  (Signed 29-May-14 13:40)  Authored: Chief Complaint, VITAL SIGNS/ANCILLARY NOTES, Brief Assessment, Lab Results, Radiology Results, Assessment/Plan   Last Updated: 29-May-14 13:42 by Loistine Simas (MD)

## 2014-07-23 NOTE — Consult Note (Signed)
General Aspect 81 year old female who has history of COPD, coronary artery disease, hypertension, history of recent CABG at [facility] 10/13, history of diet-controlled diabetes, chronic back pain and arthritis.  history of previous GI bleeding, for which she underwent a selective mesenteric angiography with embolization for control of gastrointestinal bleed of the inferior mesenteric artery as well as sigmoid artery on 04/18/2011 by Dr. Delana Meyer. Presenting with GI bleed. Cardiology was consulted for epsiode of chest pain, diaphoresis this AM in setting of continued GI bleed and anemia.   The patient  has been leaking blood through her rectum the past few days. Apparently the patient has not had any significant symptoms like dizziness, lightheadedness, chest pain or shortness of breath until this AM.   Her hemoglobin was 12 on arrival with drop to low 8 range, and we have a prior hemoglobin of 12.6 in May 2014. Dr. Gustavo Lah has been contacted and Dr. Delana Meyer from vascular surgery has been contacted for possibility of an embolization.   Present Illness . Patient was admitted to [facility] with chest pain in 8/12.  Troponin was mildly elevated.  Left heart cath showed severe, diffuse disease in a small RCA that was not amenable to intervention.  This was the likely culprit vessel.  She also had a 70% LAD stenosis.  Leane Call was then done to assess for ischemia in the LAD distribution.  This showed only a fixed apical perfusion defect with no ischemia (low risk).  She was admitted to [facility] in 1/13 with a lower GI bleed that was thought to be diverticular.  She required blood transfusion.  Due to ongoing episodes of chest pain, I sent her for LHC in 10/13, and she ended up having CABG x 4.  She did well post-op and was sent to a nursing home.  She was, however, readmitted in 11/13 with acute on chronic diastolic CHF and an an acute exacerbation of COPD.  She was diuresed and treated with antibiotics.  Echo in  11/13 showed normal LV systolic function.   SOCIAL HISTORY: The patient lives by herself. She used to be a smoker of a couple of cigarettes a day she says, but she quit within a week. She denies any alcohol. Denies any drug use. Her power of attorney is her daughter Olivia Mackie, phone number [phone].  PRIMARY CARE PHYSICIAN: Modesto Charon, MD  PAST SURGICAL HISTORY: 1.  Right shoulder replacement.  2.  Left shoulder replacement.  3.  Pelvic surgery with bladder tuck.   FAMILY HISTORY: Positive for diabetes and heart disease in her parents.   Physical Exam:  GEN well developed, well nourished, no acute distress   HEENT pale conjunctivae, hearing intact to voice, moist oral mucosa   NECK supple   RESP normal resp effort  clear BS   CARD Regular rate and rhythm  No murmur   ABD denies tenderness  soft   LYMPH negative neck   EXTR negative edema   SKIN normal to palpation   NEURO motor/sensory function intact   PSYCH alert, A+O to time, place, person, good insight   Review of Systems:  Subjective/Chief Complaint weakness, malaise, chest pain resolved   General: Fatigue  Weakness   Skin: No Complaints   ENT: No Complaints   Eyes: No Complaints   Neck: No Complaints   Respiratory: Short of breath   Cardiovascular: Chest pain or discomfort  resolved   Gastrointestinal: No Complaints   Genitourinary: No Complaints   Vascular: No Complaints  Musculoskeletal: No Complaints   Neurologic: No Complaints   Hematologic: No Complaints   Endocrine: No Complaints   Psychiatric: No Complaints   Review of Systems: All other systems were reviewed and found to be negative   Medications/Allergies Reviewed Medications/Allergies reviewed     COPD:    Chronic Back Pain:    Diabetes:    Hypertension:    Asthma:    CABG: Oct 2013   Lumbar Fusion: Mar 2009   Shoulder Surgery - Left:    Hysterectomy - Total:    Shoulder Surgery - Right:   Home  Medications: Medication Instructions Status  ALPRAZolam 0.25 mg oral tablet  orally 2 times a day, As Needed - for Anxiety, Nervousness Active  multivitamin 1 tab(s) orally once a day Active  gabapentin 300 mg oral capsule 1 cap(s) orally once a day (at bedtime) Active  Melatonin 5 mg oral tablet 1 tab(s) orally once a day (at bedtime) Active  acetaminophen-oxyCODONE 325 mg-10 mg oral tablet  orally every 6 hours Active  Fish Oil 1000 mg oral capsule  orally once a day Active  Aspir 81 81 mg oral tablet 1 tab(s) orally once a day Active  atorvastatin 40 mg oral tablet  orally once a day Active  ramipril 5 mg oral capsule 1 cap(s) orally once a day Active  bisoprolol 5 mg oral tablet 1 tab(s) orally once a day Active   Lab Results:  Routine Chem:  29-May-14 06:17   Glucose, Serum 96  BUN 10  Creatinine (comp) 0.69  Sodium, Serum 143  Potassium, Serum 3.6  Chloride, Serum  113  CO2, Serum 26  Calcium (Total), Serum  7.5  Anion Gap  4  Osmolality (calc) 284  eGFR (African American) >60  eGFR (Non-African American) >60 (eGFR values <32mL/min/1.73 m2 may be an indication of chronic kidney disease (CKD). Calculated eGFR is useful in patients with stable renal function. The eGFR calculation will not be reliable in acutely ill patients when serum creatinine is changing rapidly. It is not useful in  patients on dialysis. The eGFR calculation may not be applicable to patients at the low and high extremes of body sizes, pregnant women, and vegetarians.)  Cardiac:  28-May-14 09:27   CPK-MB, Serum 0.7 (Result(s) reported on 27 Aug 2012 at 10:12AM.)  Troponin I < 0.02 (0.00-0.05 0.05 ng/mL or less: NEGATIVE  Repeat testing in 3-6 hrs  if clinically indicated. >0.05 ng/mL: POTENTIAL  MYOCARDIAL INJURY. Repeat  testing in 3-6 hrs if  clinically indicated. NOTE: An increase or decrease  of 30% or more on serial  testing suggests a  clinically important change)  CK, Total 38   29-May-14 11:27   CPK-MB, Serum 0.7 (Result(s) reported on 28 Aug 2012 at 12:00PM.)  Troponin I < 0.02 (0.00-0.05 0.05 ng/mL or less: NEGATIVE  Repeat testing in 3-6 hrs  if clinically indicated. >0.05 ng/mL: POTENTIAL  MYOCARDIAL INJURY. Repeat  testing in 3-6 hrs if  clinically indicated. NOTE: An increase or decrease  of 30% or more on serial  testing suggests a  clinically important change)  Routine Hem:  29-May-14 06:17   WBC (CBC) 4.9  RBC (CBC)  2.57  Hemoglobin (CBC)  8.1  Hematocrit (CBC)  23.4  Platelet Count (CBC)  134  MCV 91  MCH 31.6  MCHC 34.8  RDW 14.4  Neutrophil % 59.3  Lymphocyte % 26.7  Monocyte % 8.1  Eosinophil % 5.1  Basophil % 0.8  Neutrophil # 2.9  Lymphocyte #  1.3  Monocyte # 0.4  Eosinophil # 0.2  Basophil # 0.0 (Result(s) reported on 28 Aug 2012 at 06:49AM.)   EKG:  Interpretation EKG shows NSR with nonspecific ST ABN anterolateral leads   Radiology Results: Nuclear Med:    28-May-14 12:07, GI Blood Loss Study - Nuc Med  GI Blood Loss Study - Nuc Med   REASON FOR EXAM:    hematochezia, prio rembolization of ima and splenic a  COMMENTS:       PROCEDURE: NM  - NM GI BLOOD LOSS STUDY  - Aug 27 2012 12:07PM     RESULT: Nuclear medicine GI bleeding evaluation dated 08/27/2012    Comparison: This study was correlated with the previous study dated   04/17/2011.    Procedure:    Radiopharmaceutical: 21.69 mCi of technetium 65 M    Dosage: Intravenous administration of 3 mL of PYP labeled with 21.69 mCi     of technetium 65 M    IV site: Left hand    Standard scintigraphic imaging of the abdomen and pelvis was obtained.    Findings:    Expected bio distribution is appreciated region of the liver, spleen,   urinary bladder and within the vasculature.    Mobile free radiopharmaceutical is appreciated within the left lower   quadrant. This finding is consistent with an active GI hemorrhage   involving the distal descending colon  in the proximal sigmoid colon.  IMPRESSION:   1. Exam positive for active GI hemorrhage with the expected source in the   region of the distal descending colon and/or proximal sigmoid colon.  2. Dr. Michel Santee of the emergency department was informed of these findings   at the time of initial interpretation.        Verified By: Mikki Santee, M.D., MD    Other- Explain in Comments Line: Other  Iron: Other  Macrolide antibiotics: Unknown  Vital Signs/Nurse's Notes: **Vital Signs.:   29-May-14 12:40  Vital Signs Type 15 min Post Blood Start Time  Temperature Temperature (F) 98.5  Celsius 36.9  Temperature Source oral  Pulse Pulse 68  Respirations Respirations 18  Systolic BP Systolic BP 466  Diastolic BP (mmHg) Diastolic BP (mmHg) 59  Mean BP 85  Pulse Ox % Pulse Ox % 99    Impression 81 year old female who has history of COPD, coronary artery disease, hypertension, history of recent CABG at [facility] 10/13, history of diet-controlled diabetes, chronic back pain and arthritis.  history of previous GI bleeding, for which she underwent a selective mesenteric angiography with embolization for control of gastrointestinal bleed of the inferior mesenteric artery as well as sigmoid artery on 04/18/2011 by Dr. Delana Meyer. Presenting with GI bleed. Cardiology was consulted for epsiode of chest pain, diaphoresis this AM in setting of continued GI bleed and anemia.   1) Chest pain Concerning for angina given severity of sx and known severe CAD/CABG Currently sx have resolved after NTG x 2 First set of cardiac enz negative --Would continue to trend cardiac enz x3 total --Encourage as high a blood count as possible (preferably HGB 10) --If cardiac enz negative, and once HGB higher, would proceed with surgery if still actively bleeding. She has been revascularized recently 01/2012 and has been doing well as an outpt.  2) GI bleed: bleeding scan suggesting distal colon possible surgery once  stable  3)HTN: could use b-blockers, also could start Ca channel blockers for angina (amlodipine), as well as isosorbide  4) CAD/CABG asa on  hold, could continue statin, b-blocker   Electronic Signatures: Ida Rogue (MD)  (Signed 29-May-14 14:10)  Authored: General Aspect/Present Illness, History and Physical Exam, Review of System, Past Medical History, Home Medications, Labs, EKG , Radiology, Allergies, Vital Signs/Nurse's Notes, Impression/Plan   Last Updated: 29-May-14 14:10 by Ida Rogue (MD)

## 2014-07-23 NOTE — Consult Note (Signed)
General Aspect GI Bleed   Present Illness The patient is a 81 year old female who has history of COPD with p.r.n. oxygen at home, coronary artery disease, hypertension, history of recent CABG at [facility].  She presents with new onset GI bleed and came to the ER.  She has also history of previous GI bleeding, massive gastrointestinal bleed, for which she underwent a selective mesenteric angiography with embolization for control of gastrointestinal bleed of the inferior mesenteric artery as well as sigmoid artery on 04/18/2011. At that moment, the possibilities were diverticular bleeding. The patient recovered from that initial episodeand has not had any repeated bleeding up until this morning.    PAST MEDICAL HISTORY: 1.  Coronary artery disease, status post CABG.  2.  Hypertension.  3.  Diet-controlled diabetes.  4.  COPD, p.r.n. oxygen at home.  5.  History of previous smoking, recently quit.  6.  Arthritis.  7.  Chronic pain.  8.  History of previous GI bleeding/diverticular bleed, status post embolization of the inferior mesenteric artery in 2013.  9.  History of a splenic aneurysm.  10.  Restless leg syndrome.  11.  Anemia of chronic disease.   Home Medications: Medication Instructions Status  ALPRAZolam 0.25 mg oral tablet  orally 2 times a day, As Needed - for Anxiety, Nervousness Active  multivitamin 1 tab(s) orally once a day Active  gabapentin 300 mg oral capsule 1 cap(s) orally once a day (at bedtime) Active  Melatonin 5 mg oral tablet 1 tab(s) orally once a day (at bedtime) Active  acetaminophen-oxyCODONE 325 mg-10 mg oral tablet  orally every 6 hours Active  Fish Oil 1000 mg oral capsule  orally once a day Active  Aspir 81 81 mg oral tablet 1 tab(s) orally once a day Active  atorvastatin 40 mg oral tablet  orally once a day Active  ramipril 5 mg oral capsule 1 cap(s) orally once a day Active  bisoprolol 5 mg oral tablet 1 tab(s) orally once a day Active    Other-  Explain in Comments Line: Other  Iron: Other  Macrolide antibiotics: Unknown  Case History:  Family History Non-Contributory   Social History positive  tobacco, negative ETOH, negative Illicit drugs   Review of Systems:  Fever/Chills No   Cough No   Sputum No   Abdominal Pain No   Diarrhea No   Constipation No   Nausea/Vomiting No   SOB/DOE No   Chest Pain No   Telemetry Reviewed NSR   Dysuria No   Physical Exam:  GEN well developed, well nourished, no acute distress   HEENT hearing intact to voice, dry oral mucosa   NECK supple  trachea midline   RESP normal resp effort  no use of accessory muscles   CARD regular rate  no JVD   ABD denies tenderness  soft   EXTR negative cyanosis/clubbing, negative edema   SKIN No rashes, skin turgor good   NEURO cranial nerves intact, follows commands, motor/sensory function intact   PSYCH alert, A+O to time, place, person, good insight   Nursing/Ancillary Notes: **Vital Signs.:   28-May-14 14:28  Vital Signs Type Admission  Temperature Temperature (F) 98.2  Celsius 36.7  Temperature Source oral  Pulse Pulse 64  Pulse source if not from Vital Sign Device per cardiac monitor  Respirations Respirations 22  Systolic BP Systolic BP 299  Diastolic BP (mmHg) Diastolic BP (mmHg) 71  Mean BP 95  BP Source  if not from Vital Sign  Device non-invasive  Pulse Ox % Pulse Ox % 96  Pulse Ox Activity Level  At rest  Oxygen Delivery Room Air/ 21 %  Pulse Ox Heart Rate 64   Hepatic:  28-May-14 09:27   Bilirubin, Total 0.2  Alkaline Phosphatase 55  SGPT (ALT) 18  SGOT (AST) 18  Total Protein, Serum 6.4  Albumin, Serum  3.1  Routine BB:  28-May-14 09:27   Crossmatch Unit 1 Transfused  Result(s) reported on 30 Aug 2012 at 07:12AM.  Crossmatch Unit 1 -  Crossmatch Unit 1 Transfused  Result(s) reported on 29 Aug 2012 at 06:12AM.  Crossmatch Unit 1 Transfused  Result(s) reported on 28 Aug 2012 at House Unit 1 -  Crossmatch Unit 2 -  Crossmatch Unit 3 -  ABO Group + Rh Type A Positive  Antibody Screen NEGATIVE (Result(s) reported on 27 Aug 2012 at 11:38AM.)  Routine Micro:  28-May-14 12:32   Micro Text Report URINE CULTURE   COMMENT                   NO GROWTH IN 18-24 HOURS   ANTIBIOTIC                       Specimen Source IN AND OUT CATH  Culture Comment NO GROWTH IN 18-24 HOURS  Result(s) reported on 28 Aug 2012 at 11:26AM.  Cardiology:  28-May-14 09:12   Ventricular Rate 63  Atrial Rate 63  P-R Interval 158  QRS Duration 64  QT 380  QTc 388  P Axis 64  R Axis -11  T Axis 78  ECG interpretation Normal sinus rhythm Voltage criteria for left ventricular hypertrophy Nonspecific ST and T wave abnormality Abnormal ECG When compared with ECG of 02-Aug-2012 20:54, No significant change was found ----------unconfirmed---------- Confirmed by OVERREAD, NOT ([id number]), editor PEARSON, Vinisha ([id number]) on 08/27/2012 2:16:12 PM  Routine Chem:  28-May-14 09:27   Glucose, Serum  119  BUN 16  Creatinine (comp) 0.70  Sodium, Serum 143  Potassium, Serum 3.6  Chloride, Serum  113  CO2, Serum 24  Calcium (Total), Serum 8.5  Anion Gap  6  Osmolality (calc) 287  eGFR (African American) >60  eGFR (Non-African American) >60 (eGFR values <75mL/min/1.73 m2 may be an indication of chronic kidney disease (CKD). Calculated eGFR is useful in patients with stable renal function. The eGFR calculation will not be reliable in acutely ill patients when serum creatinine is changing rapidly. It is not useful in  patients on dialysis. The eGFR calculation may not be applicable to patients at the low and high extremes of body sizes, pregnant women, and vegetarians.)  Result Comment XMATCH - DUPLICATE ORDER  Result(s) reported on 29 Aug 2012 at 10:40AM.  Result Comment HGB - VERIFIED IN ERROR  Result(s) reported on 28 Aug 2012 at 03:35PM.  Result Comment XMATCH - HEMOGLOBIN IS 10.8  -  CANCEL PER KELLEY PENDLETON 08-27-12  Result(s) reported on 27 Aug 2012 at 01:46PM.  Magnesium, Serum  1.4 (1.8-2.4 THERAPEUTIC RANGE: 4-7 mg/dL TOXIC: > 10 mg/dL  -----------------------)  Cardiac:  28-May-14 09:27   Troponin I < 0.02 (0.00-0.05 0.05 ng/mL or less: NEGATIVE  Repeat testing in 3-6 hrs  if clinically indicated. >0.05 ng/mL: POTENTIAL  MYOCARDIAL INJURY. Repeat  testing in 3-6 hrs if  clinically indicated. NOTE: An increase or decrease  of 30% or more on serial  testing suggests a  clinically important change)  CPK-MB, Serum 0.7 (Result(s)  reported on 27 Aug 2012 at 10:12AM.)  CK, Total 38  Routine UA:  28-May-14 12:32   Color (UA) Yellow  Clarity (UA) Hazy  Glucose (UA) Negative  Bilirubin (UA) Negative  Ketones (UA) Negative  Specific Gravity (UA) 1.026  Blood (UA) Negative  pH (UA) 5.0  Protein (UA) Negative  Nitrite (UA) Negative  Leukocyte Esterase (UA) 2+ (Result(s) reported on 27 Aug 2012 at 01:19PM.)  RBC (UA) 4 /HPF  WBC (UA) 20 /HPF  Bacteria (UA) NONE SEEN  Epithelial Cells (UA) 2 /HPF  Mucous (UA) PRESENT (Result(s) reported on 27 Aug 2012 at 01:19PM.)  Routine Coag:  28-May-14 09:27   Prothrombin 13.8  INR 1.0 (INR reference interval applies to patients on anticoagulant therapy. A single INR therapeutic range for coumarins is not optimal for all indications; however, the suggested range for most indications is 2.0 - 3.0. Exceptions to the INR Reference Range may include: Prosthetic heart valves, acute myocardial infarction, prevention of myocardial infarction, and combinations of aspirin and anticoagulant. The need for a higher or lower target INR must be assessed individually. Reference: The Pharmacology and Management of the Vitamin K  antagonists: the seventh ACCP Conference on Antithrombotic and Thrombolytic Therapy. LMBEM.7544 Sept:126 (3suppl): [id number]. A HCT value >55% may artifactually increase the PT.  In one study,  the  increase was an average of 25%. Reference:  "Effect on Routine and Special Coagulation Testing Values of Citrate Anticoagulant Adjustment in Patients with High HCT Values." American Journal of Clinical Pathology 2006;126:400-405.)  Routine Hem:  28-May-14 09:27   WBC (CBC) 6.0  RBC (CBC)  3.34  Hemoglobin (CBC) -  Hemoglobin (CBC)  10.8  Hematocrit (CBC)  31.0  Platelet Count (CBC) 163 (Result(s) reported on 27 Aug 2012 at 09:54AM.)  MCV 93  MCH 32.2  MCHC 34.7  RDW 14.3    16:09   Hemoglobin (CBC)  8.8 (Result(s) reported on 27 Aug 2012 at 04:32PM.)    21:17   Hemoglobin (CBC)  9.6 (Result(s) reported on 27 Aug 2012 at 09:38PM.)   Nuclear Med:    28-May-14 12:07, GI Blood Loss Study - Nuc Med  GI Blood Loss Study - Nuc Med   REASON FOR EXAM:    hematochezia, prio rembolization of ima and splenic a  COMMENTS:       PROCEDURE: NM  - NM GI BLOOD LOSS STUDY  - Aug 27 2012 12:07PM     RESULT: Nuclear medicine GI bleeding evaluation dated 08/27/2012    Comparison: This study was correlated with the previous study dated   04/17/2011.    Procedure:    Radiopharmaceutical: 21.69 mCi of technetium 60 M    Dosage: Intravenous administration of 3 mL of PYP labeled with 21.69 mCi     of technetium 52 M    IV site: Left hand    Standard scintigraphic imaging of the abdomen and pelvis was obtained.    Findings:    Expected bio distribution is appreciated region of the liver, spleen,   urinary bladder and within the vasculature.    Mobile free radiopharmaceutical is appreciated within the left lower   quadrant. This finding is consistent with an active GI hemorrhage   involving the distal descending colon in the proximal sigmoid colon.  IMPRESSION:   1. Exam positive for active GI hemorrhage with the expected source in the   region of the distal descending colon and/or proximal sigmoid colon.  2. Dr. Michel Santee of the emergency department was informed of  these findings   at the  time of initial interpretation.        Verified By: Mikki Santee, M.D., MD    Impression 1.  GI Bleed recurrent with past history of embolization.  Given the history of previous embolization the patient is at high risk for colon necrosis if reembolization is performed.  Therefore, conservative measures should be adopted and surgery should evaluate the patient.  If it comes to the point that surgery is needed then embolization as a last resort before surgery would be appropriate. 2.  Urinary tract infection: The patient has significant urinary tract infection with mild symptoms, for what we are going to start her on for Rocephin. Await for cultures.  3.  History of chronic obstructive pulmonary disease: Is stable at this moment. The patient does not require any steroids or significant treatments. We are going to add on DuoNebs as needed.  4.  Coronary artery disease: Holding aspirin, holding blood pressure medications including beta blockers due to the fact that the patient could become unstable at any moment.  5.  Hypertension: Again, hold blood pressure medications as mentioned above.  6.  History of tobacco abuse: The patient has quit within the last 1 week. Counseling given to the patient with main purpose to encourage her not to smoke anymore.   Plan level 4 consult   Electronic Signatures: Hortencia Pilar (MD)  (Signed 31-May-14 14:00)  Authored: General Aspect/Present Illness, Home Medications, Allergies, History and Physical Exam, Vital Signs, Labs, Radiology, Impression/Plan   Last Updated: 31-May-14 14:00 by Hortencia Pilar (MD)

## 2014-07-23 NOTE — Consult Note (Signed)
Chief Complaint:  Subjective/Chief Complaint seen for lower gi bleeding.  Stable, no bm since yesterday at noon.  Mild llq abdominal discomfort.  no n/v, tolerating clears.   VITAL SIGNS/ANCILLARY NOTES: **Vital Signs.:   30-May-14 15:40  Vital Signs Type Blood Transfusion Complete  Temperature Temperature (F) 97.8  Celsius 36.5  Pulse Pulse 68  Respirations Respirations 20  Systolic BP Systolic BP 146  Diastolic BP (mmHg) Diastolic BP (mmHg) 65  Mean BP 92  Pulse Ox % Pulse Ox % 96  Oxygen Delivery Room Air/ 21 %   Brief Assessment:  Cardiac Regular   Respiratory clear BS   Gastrointestinal details normal Soft  Nondistended  No masses palpable  Bowel sounds normal  mild llq discomfort to palpation   Lab Results: LabObservation:  30-May-14 10:56   OBSERVATION Reason for Test  Cardiology:  30-May-14 10:56   Echo Doppler REASON FOR EXAM:     COMMENTS:     PROCEDURE: [facility] - ECHO DOPPLER COMPLETE(TRANSTHOR)  - Aug 29 2012 10:56AM   RESULT: Echocardiogram Report  Patient Name:   [name] Date of Exam: 08/29/2012 Medical Rec #:  [id number]          Custom1: Date of Birth:  November 16, 1931       Height:       1692.0 in Patient Age:    81 years        Weight: Patient Gender: F               BSA:  Indications: MI Sonographer:    Cristela Blue RDCS Referring Phys: Berlinda Last, ROBERTO  Summary:  1. Left ventricular ejection fraction, by visual estimation, is 55 to  60%.  2. Normal global left ventricular systolic function.  3. Impaired relaxation pattern of LV diastolic filling.  4. Mild concentric left ventricular hypertrophy.  5. Mildly dilatedleft atrium.  6. Mild aortic valve sclerosis without stenosis. 2D AND M-MODE MEASUREMENTS (normal ranges within parentheses): Left Ventricle:          Normal IVSd (2D):      1.58 cm (0.7-1.1) LVPWd (2D):     1.50 cm (0.7-1.1) Aorta/LA:       Normal LVIDd (2D):     4.50 cm (3.4-5.7) Aortic Root (2D): 3.00 cm (2.4-3.7) LVIDs  (2D):     2.46 cm           Left Atrium (2D): 4.70 cm (1.9-4.0) LV FS (2D):     45.3 %   (>25%) LV EF (2D):     76.8 %   (>50%)       Right Ventricle:                                   RVd (2D):        3.47 cm LV DIASTOLIC FUNCTION: MV Peak E: 3.81 m/s E/e' Ratio: 12.80 MV Peak A: 1.14 m/s Decel Time: 264 msec E/A Ratio: 0.72 SPECTRAL DOPPLER ANALYSIS (where applicable): Mitral Valve: MV P1/2 Time: 76.56 msec MV Area, PHT: 2.87 cm?? Aortic Valve: AoV Max Vel: 1.17 m/s AoV Peak PG: 5.5 mmHg AoV Mean PG: LVOT Vmax: 0.90 m/s LVOT VTI:  LVOT Diameter: 2.20 cm AoV Area, Vmax: 2.93 cm?? AoV Area, VTI:  AoV Area, Vmn: Tricuspid Valve and PA/RV Systolic Pressure: TR Max Velocity: 2.43 m/s RA  Pressure: 5 mmHg RVSP/PASP: 28.6 mmHg Pulmonic Valve: PV Max Velocity: 0.88 m/s PV Max PG:  3.1 mmHg PV Mean PG:  PHYSICIAN INTERPRETATION: Left Ventricle: The left ventricular internal cavity size was normal.  Mild concentric left ventricular hypertrophy. Global LV systolic function  was normal. Left ventricular ejection fraction, by visual estimation, is  55 to 60%. Spectral Doppler shows impaired relaxation pattern of LV  diastolic filling. Right Ventricle: Normal right ventricular size, wall thickness, and  systolic function. Left Atrium: The left atrium is mildly dilated. Right Atrium: The right atrium is normal in size. Pericardium: There is no evidence of pericardial effusion. Mitral Valve: The mitral valve is normal in structure. No evidence of  mitral valve stenosis. Trace mitral valve regurgitation is seen. Tricuspid Valve: The tricuspid valve is normal. Trivial tricuspid  regurgitation is visualized. The tricuspid regurgitant velocity is 2.43  m/s, and with an assumed right atrial pressure of 5 mmHg, the estimated  right ventricular systolic pressure is normal at 28.6 mmHg. Aortic Valve: The aortic valve is normal. Mild aortic valve sclerosis is  present, with no evidence of aortic valve  stenosis. No evidence of aortic  valve regurgitation is seen. Pulmonic Valve: The pulmonic valve is not well seen. Aorta: The aortic root is normal in size and structure. Venous: The inferior vena cava was not well visualized. [id number] Lorine Bears MD Electronically signed by [id number] Lorine Bears MD Signature Date/Time: 08/29/2012/4:45:34 PM  *** Final ***  IMPRESSION: .    Verified By: Chelsea Aus. ARIDA, M.D., MD  Cardiac:  30-May-14 04:03   Troponin I < 0.02 (0.00-0.05 0.05 ng/mL or less: NEGATIVE  Repeat testing in 3-6 hrs  if clinically indicated. >0.05 ng/mL: POTENTIAL  MYOCARDIAL INJURY. Repeat  testing in 3-6 hrs if  clinically indicated. NOTE: An increase or decrease  of 30% or more on serial  testing suggests a  clinically important change)  Routine Hem:  28-May-14 09:27   Hemoglobin (CBC) -  Hemoglobin (CBC)  10.8    16:09   Hemoglobin (CBC)  8.8 (Result(s) reported on 27 Aug 2012 at 04:32PM.)    21:17   Hemoglobin (CBC)  9.6 (Result(s) reported on 27 Aug 2012 at 09:38PM.)  29-May-14 06:17   Hemoglobin (CBC)  8.1    16:18   Hemoglobin (CBC)  9.5 (Result(s) reported on 28 Aug 2012 at 04:38PM.)    22:34   Hemoglobin (CBC)  8.6 (Result(s) reported on 28 Aug 2012 at 11:43PM.)  30-May-14 04:03   Hemoglobin (CBC)  8.4 (Result(s) reported on 29 Aug 2012 at 04:50AM.)   Assessment/Plan:  Assessment/Plan:  Assessment 1) hematochezia, probable diverticular bleeding in the setting of previous diverticular bleeding.  stable. appreciate surgical and vascular input.  2) chest pain, h/o recent CABG-stable, cardiology following   Plan 1) continue current, could advance diet to full liquids tomorrow if doing well.   No new GI recs-Dr Oh covering this weekend.   Electronic Signatures: Barnetta Chapel (MD)  (Signed [phone] 17:47)  Authored: Chief Complaint, VITAL SIGNS/ANCILLARY NOTES, Brief Assessment, Lab Results, Assessment/Plan   Last Updated: 30-May-14 17:47  by Barnetta Chapel (MD)

## 2014-07-23 NOTE — Consult Note (Signed)
Chief Complaint:  Subjective/Chief Complaint Patietn seen and examined, please see full GI consult.  Patient admitted with lower gi bleeding, in setting of h/o same over a year ago.  Patietn has severe pandiverticulosis, please see colonoscopy from 06/2011.  Patient recieving blood, hemodynamically stablem though continuing mith multiple bloody bm over the course of the day.  Locations of bleeding similar on both bleeding scans form 04/2011 and today.  Discussed with Dr Juliann Pulse and Dr Gilda Crease, not a candidate for repeat microembolization, possible surgical candidate, though high risk in light of comorbidities.  Review of last colonoscopy a year ago indicated very difficult pathway severe pandiverticulosis, poor candidate for localization colonoscopically.  Continue current with tfx as needed, ICU observation, further recs to follow.   VITAL SIGNS/ANCILLARY NOTES: **Vital Signs.:   28-May-14 17:47  Vital Signs Type Blood Transfusion  Temperature Temperature (F) 98.7  Celsius 37  Temperature Source oral  Pulse Pulse 70  Respirations Respirations 23  Systolic BP Systolic BP 165  Diastolic BP (mmHg) Diastolic BP (mmHg) 71  Mean BP 102  Pulse Ox % Pulse Ox % 95  Oxygen Delivery Room Air/ 21 %   Electronic Signatures: Barnetta Chapel (MD)  (Signed 28-May-14 19:45)  Authored: Chief Complaint, VITAL SIGNS/ANCILLARY NOTES   Last Updated: 28-May-14 19:45 by Barnetta Chapel (MD)

## 2014-07-23 NOTE — H&P (Signed)
PATIENT NAME:  [name], [name] MR#:  [id number] DATE OF BIRTH:  1932/01/01  DATE OF ADMISSION:  08/27/2012  REFERRING PHYSICIAN: Caleen Jobs. Braud, MD  REASON FOR ADMISSION: Severe GI bleeding lower tract. Possible diverticular bleeding.   HISTORY OF PRESENT ILLNESS: [name] is a very nice 81 year old female who has history of COPD with p.r.n. oxygen at home, coronary artery disease, hypertension, history of recent CABG at [facility], history of diet-controlled diabetes, chronic back pain and arthritis. In the past, apparently the patient had a splenic aneurysm, which has been followed closely. She has also history of previous GI bleeding, massive gastrointestinal bleed, for which she underwent a selective mesenteric angiography with embolization for control of gastrointestinal bleed of the inferior mesenteric artery as well as sigmoid artery on 04/18/2011 by Dr. Gilda Crease. At that moment, the possibilities were diverticular bleeding. The patient recovered from that and has not had any repeated bleeding up until this morning. The patient woke up this morning after going to bed in her normal state of health, had a bowel movement that had significant blood and since then, she has been leaking blood through her rectum. Apparently the patient has not had any significant symptoms like dizziness, lightheadedness, chest pain or shortness of breath. She says that the bleeding is continuous, is very annoying, but she feels her normal self right now. She is evaluated in the ER and at this moment, her vital signs are stable. She has actually elevated blood pressure. Her hemoglobin is 12, and we have a prior hemoglobin of 12.6 in May 2014. The patient is admitted to the Critical Care Unit due to the severity of the bleeding for continuous evaluation. She also had elevation of the white blood cells in urine, possibility of urinary tract infection. Dr. Marva Panda has been contacted and Dr. Gilda Crease from vascular  surgery has been contacted for possibility of an embolization.   REVIEW OF SYSTEMS: A 12-system review of systems is done.  CONSTITUTIONAL: No fever or fatigue. No weakness. No weight loss.  EYES: No blurry vision, double vision or inflammation.  EARS, NOSE, THROAT: No tinnitus. No difficulty swallowing. No sinus pain.  RESPIRATORY: No cough, wheezing, hemoptysis or dyspnea at this moment. She does have COPD. She used to smoke, but she quit within a week from today.  CARDIOVASCULAR: No chest pain, orthopnea, edema, palpitations or syncope.  GASTROINTESTINAL: No nausea, vomiting or diarrhea. Positive bright red blood per rectum. No history of melena. No history of hemorrhoids. Positive diverticular bleeding in the past. GENITOURINARY: No dysuria, hematuria or changes in frequency, although the patient states that her urine has been very cloudy and with foul smell. The patient has had urinary infections in the past.  ENDOCRINE: No polyuria, polydipsia, polyphagia, cold or heat intolerance.  HEMATOLOGIC AND LYMPHATIC: Positive rectal bleeding. No easy bruising. No anemia. No swollen glands.  SKIN: No rashes, petechiae or changes in moles.  MUSCULOSKELETAL: No significant neck pain, back pain, redness or gout.  NEUROLOGIC: No weakness, vertigo or ataxia.  PSYCHIATRIC: No significant insomnia or nervousness.   PAST MEDICAL HISTORY: 1.  Coronary artery disease, status post CABG.  2.  Hypertension.  3.  Diet-controlled diabetes.  4.  COPD, p.r.n. oxygen at home.  5.  History of previous smoking, recently quit.  6.  Arthritis.  7.  Chronic pain.  8.  History of previous GI bleeding/diverticular bleed, status post embolization of the inferior mesenteric artery in 2013.  9.  History of a splenic aneurysm.  10.  Restless leg syndrome.  11.  Anemia of chronic disease.   ALLERGIES: ANTIBIOTICS, MACROLIDES AND IRON PILLS.   CURRENT MEDICATIONS: Percocet 10/325 every 6 hours as needed for pain,  aspirin 81 mg daily, ramipril 5 mg once a day, gabapentin 300 mg once a day, atorvastatin 40 mg once daily, Apresoline  0.25 mg as needed for anxiety, bisoprolol 5 mg once a day, fish oil 1000 mg once a day, melatonin 5 mg once a day and multivitamins once a day. We are going to hold on all of those medications, as the patient is n.p.o. Her blood pressure is high, but we do not know if she is going to become hypotensive at any moment, for what blood pressure medications are held as well.   SOCIAL HISTORY: The patient lives by herself. She used to be a smoker of a couple of cigarettes a day she says, but she quit within a week. She denies any alcohol. Denies any drug use. Her power of attorney is her daughter [name], phone number [phone].  PRIMARY CARE PHYSICIAN: Arta Silence, MD  PAST SURGICAL HISTORY: 1.  Right shoulder replacement.  2.  Left shoulder replacement.  3.  Pelvic surgery with bladder tuck.   FAMILY HISTORY: Positive for diabetes and heart disease in her parents.   PHYSICAL EXAMINATION:   VITAL SIGNS: Blood pressure 145/71, pulse 64, temperature 98.2, pulse oximetry 96%. GENERAL: Alert, oriented x3. No acute distress. No respiratory distress. Hemodynamically stable.  HEENT: Pupils are equal and reactive. Extraocular movements intact. Mucosa are moist. Anicteric sclerae. Pink conjunctivae.  NECK: Supple. No JVD. No thyromegaly. No adenopathy. No carotid bruits. No rigidity.  CARDIOVASCULAR: Regular rate and rhythm. No murmurs, rubs or gallops. No displacement of PMI. No tenderness to palpation of anterior chest wall.  LUNGS: Clear without any wheezing or crepitus. No use of accessory muscles.  ABDOMEN: Soft, nontender, nondistended. No hepatosplenomegaly. No masses. Bowel sounds are positive. The patient states, "Please be careful when you push on my left side because that will make me have more stool and blood coming out," but she was not tender in her left lower quadrant.   GENITAL: Negative for external lesions. Positive for significant rectal bleeding visualized by myself.  EXTREMITIES: No edema, cyanosis or clubbing. Pulses +2. Capillary refill less than 3.  SKIN: Without any rashes or petechiae.  LYMPHATIC: Negative for lymphadenopathy in neck or supraclavicular areas.  NEUROLOGIC: Cranial nerves II through XII intact.  PSYCHIATRIC: Mood is normal without any signs of significant depression, anxiety or agitation.   MUSCULOSKELETAL: No significant joint lesions or edema of the joints.   LABORATORY, DIAGNOSTIC AND RADIOLOGICAL DATA: EKG: Normal sinus rhythm. Glucose 119, potassium 3.6, sodium 143, magnesium 1.4. LFTs within normal limits. Troponin is negative. Hemoglobin 10.8. On May 6, her hemoglobin is 12.6. White count is 6.0, platelets 163. INR 1.0. Urinalysis has 4 red blood cells, 20 white blood cells, positive leukocyte esterase, negative  nitrates. The patient is slightly symptomatic. Bleeding scan: Positive for active hemorrhage with expected source in the region of the distal descending colon, proximal sigmoid colon.   ASSESSMENT AND PLAN: A 81 year old female with: 1.  History of severe gastrointestinal  bleeding, drop of hemoglobin of 12 points within the past 2 [place]. The patient has active bleeding that continues, for which we are going to admit her to the Critical Care Unit and keep her  monitored closely. The patient is going to be evaluated by vascular surgery for possible embolization.  She has had embolization in the past, for what there is a risk of ischemic injury of the colon, but at this moment her bleeding is significant, for what it is very likely that she needs to get the procedure done anyway. Blood has been reserved for the patient, intravenous fluids given. At this moment she is hemodynamically stable. I am going to start her on proton pump inhibitor twice daily. Hold any type of antiplatelet therapy. No deep vein thrombosis prophylaxis other  than mechanical. The patient is to be monitored closely. The patient as well has been informed about her diagnosis and explained the complications of possible procedure. She is okay having a procedure. Since the patient does not have any fever or elevation of the white blood count, there is no acute indication to start her on any antibiotics. If the patient has bumped up in her white count or fever, we are going to start antibiotics prophylactically with Cipro and Flagyl, but not at this moment.  2.  Urinary tract infection: The patient has significant urinary tract infection with mild symptoms, for what we are going to start her on for Rocephin. Await for cultures.  3.  History of chronic obstructive pulmonary disease: Is stable at this moment. The patient does not require any steroids or significant treatments. We are going to add on DuoNebs as needed.  4.  Coronary artery disease: Holding aspirin, holding blood pressure medications including beta blockers due to the fact that the patient could become unstable at any moment.  5.  Hypertension: Again, hold blood pressure medications as mentioned above.  6.  History of tobacco abuse: The patient has quit within the last 1 week. Counseling given to the patient with main purpose to encourage her not to smoke anymore.  7.  Gastrointestinal prophylaxis with Protonix.  8.  Deep vein thrombosis prophylaxis: Mechanical pneumatic devices.   TIME SPENT: I spent about 45 minutes with this patient who has a potential for significant decompensation. Critical care time of 45 minutes, as this is a life-threatening emergency.   ____________________________ Felipa Furnace, MD rsg:jm D: 08/27/2012 15:14:46 ET T: 08/27/2012 16:18:46 ET JOB#: [id number]  cc: Felipa Furnace, MD, <Dictator> Jordyne Poehlman Juanda Chance MD ELECTRONICALLY SIGNED 09/08/2012 1:49

## 2014-07-23 NOTE — Consult Note (Signed)
PATIENT NAME:  [name], REBER MR#:  [id number] DATE OF BIRTH:  06-25-1931  DATE OF ADMISSION:  08/27/2012  DATE OF CONSULT:  08/27/2012  CONSULTING PHYSICIAN:  Keturah Barre, NP  ORDERING PHYSICIAN:  Dr. Mordecai Maes  REASON FOR EVALUATION:  GI bleeding.   HISTORY OF PRESENT ILLNESS:  I appreciate consult for this 81 year old Caucasian woman for evaluation of rectal bleeding. The patient states she was feeling well, and spent the night with her son. When she awoke this morning, she had blood on her gown and came to the Emergency Department. Describes the blood as bright red, in large amounts at first, but has been decreasing over the day. Last episode was approximately 1500. She had a positive bleeding scan for bleeding in the distal descending/proximal sigmoid colon. A vascular consult is pending. Did have a colonoscopy about this time last year that revealed diverticula. Did also have an episode of diverticular bleeding about a year ago, from which she recovered well. Her hemoglobin is currently stable at 10.8. Her PT and INR are normal. Her white count is 6. She also does have a UTI, and antibiotics have been prescribed. She denies abdominal pain, nausea, vomiting, diarrhea, dyspepsia, further GI complaints.   PAST MEDICAL HISTORY: Coronary artery disease, status post CABG October 2013, hypertension, diet-controlled diabetes, COPD. Was recently hospitalized for this at the first of the month, and recovered well. Prior tobacco abuse, arthritis, chronic pain. Did undergo embolization in 2013 of the IMA. History of splenic aneurysm, restless legs syndrome, anemia of chronic disease.  ALLERGIES:  MACROLIDES, IRON PILLS.   SOCIAL HISTORY:  Lives alone. States she has quit smoking cigarettes within the week. No EtOH or illicits.   PRIMARY CARE PHYSICIAN:  [name]   PAST SURGICAL HISTORY:  Includes shoulder replacement bilaterally and pelvic surgery with a bladder tack.   FAMILY HISTORY:   Significant for diabetes and hypertension.  MEDICATIONS:  Percocet 10/325 q. 6 hours p.r.n. pain, ASA 81 mg p.o. daily, Ramipril 5 mg p.o. daily, gabapentin 300 mg p.o. daily, atorvastatin 40 mg p.o. daily, hydralazine 0.25 mg, bisoprolol 5 mg daily, fish oil once a day, melatonin 5 mg p.o. at bedtime, MVI once a day.   MOST RECENT LABS:  Glucose 119, BUN 16, creatinine 0.7, sodium 143, potassium 3.6, GFR greater than 60, magnesium 1.4, total serum protein 6.4, albumin 3.1, total bilirubin 0.2, ALP 55, AST 18, ALT 18. CPK, troponin negative. WBC 6, hemoglobin 10.8, hematocrit 31, platelet count 163, red cells are normocytic. The patient is A-positive. Her PT was 13.8, her INR was 1.0.    MOST RECENT VITAL SIGNS: Temp 98.2, pulse 64, respiratory rate 22, blood pressure 147/71, Sa02 96% on room air.   PHYSICAL EXAMINATION:  GENERAL:  A pleasant, cooperative Caucasian woman lying in bed in no acute distress.  HEENT:  Normocephalic, atraumatic. Conjunctivae pink. Sclerae clear.  NECK:  No JVD, thyromegaly, adenopathy.  CHEST:  Respirations eupneic. Lungs clear to auscultation and percussion.  CARDIAC:  S1, S2. Regular rate and rhythm. No murmurs, rubs or gallops. Normal sinus rhythm on the monitor. Peripheral pulses are 2+ radially and to the dorsalis pedal areas bilaterally. No appreciable edema.  ABDOMEN: Bowel sounds x 4. Soft, nondistended, nontender. No hepatosplenomegaly. No masses, guarding, rigidity, peritoneal signs.  EXTREMITIES:  Moves all extremities well x 4. Sensation intact. No clubbing or cyanosis.  SKIN: Warm, dry, pink. No erythema, lesion or rash.   NEUROLOGIC:  Alert, oriented x 3. Cranial nerves II  through XII intact. Speech clear. No facial droop.  PSYCHIATRIC:  Cooperative, calm, mood stable.   IMPRESSION AND PLAN: Lower gastrointestinal bleed, diverticular in nature. Hemodynamically stable. Has hemoglobin reassessment soon. Await vascular opinion on an embolization. Nursing  staff reports that Dr. Marijean Heath office has been contacted. He will be coming. Do recommend serial hemoglobins, transfusion p.r.n. Further recommendations to follow.   These services were provided by Vevelyn Pat, MSN, [facility] in collaboration with Barnetta Chapel, MD, with whom I have discussed this patient in full. Thank you for this consult.    ____________________________ Keturah Barre, NP chl:mr D: 08/27/2012 17:52:48 ET T: 08/27/2012 19:47:25 ET JOB#: [id number]  cc: Keturah Barre, NP, <Dictator> Eustaquio Maize Eliel Dudding FNP ELECTRONICALLY SIGNED 09/17/2012 16:03

## 2014-07-23 NOTE — H&P (Signed)
PATIENT NAME:  [name], [name] MR#:  [id number] DATE OF BIRTH:  1931-10-16  DATE OF ADMISSION:  08/03/2012  REFERRING PHYISICIAN:  Dr. Maricela Bo.  PRIMARY CARE PHYSICIAN:  Dr. [name].   CHIEF COMPLAINT:  Shortness of breath, cough.   HISTORY OF PRESENT ILLNESS:  This is a 81 year old female with significant past medical history of COPD on as needed home oxygen, current tobacco abuse, hypertension, coronary artery disease status post recent CABG at [facility], who presents with complaints of shortness of breath and cough, the patient reports since her symptoms started over the last few days, which has been progressively worsening, as well she reports that she is currently having cough with productive thick white sputum, reports her symptoms worsened by exertion, denies any fever or chills, the patient is known to have history of chronic obstructive pulmonary disease on as needed home oxygen at home 2 liters nasal cannula, mainly upon exertion and bedtime, as well the patient has been complaining of soreness in the anterior chest wall reproducible by palpation, over the CABG area for the last few [place], where she reports she has been seen by her cardiology for that, Dr. Buzzy Han at [place], where he stopped her aspirin and started her on naproxen as needed for that, the patient's chest x-ray did not show any acute infiltrate, had diffuse wheezing upon presentation, where she was started on IV Solu-Medrol in the ED, as well the patient is known to have history of hypertension, did not take her medication evening, her blood pressure was uncontrolled in the ED which did improve after she received Imdur, the patient denies any nausea, vomiting, diarrhea, abdominal pain, dark colored stools, has chronic complaints of lower back pain and muscle aches, as well complains of generalized weakness, hospitalist service were requested to admit the patient for further management and work-up for her chronic  obstructive pulmonary disease exacerbation.   PAST MEDICAL HISTORY: 1.  History of coronary artery disease, status post recent CABG at [facility].  2.  Hypertension.  3.  Diet-controlled diabetes.  4.  Chronic obstructive pulmonary disease on as needed oxygen at home.  5.  Ongoing smoking.  6.  Chronic back pain.  7.  Arthritis.  8.  History of splenic aneurysm followed by her last CT scan by PCP.  9.  Restless leg syndrome.  10.  Anemia of chronic disease.   ALLERGIES:  1.  IRON PILLS.  2.  MACROLIDE ANTIBIOTICS.   HOME MEDICATIONS: 1.  Percocet as needed.  2.  Ramipril 2.5 mg oral daily.  3.  Imdur 30 mg oral daily.  4.  Alprazolam 0.5 mg at night as needed.  5.  Lipitor 40 mg at bedtime.  6.  Metoprolol succinate 25 mg 2 times a day.  7.  Vitamin B12.  8.  Vitamin C.  9.  Vitamin D3.  10.  Co Q-10 1 capsule oral daily.   SOCIAL HISTORY:  The patient lives at home by herself, she is still smoking around three cigarettes per day, no alcohol or illicit drug use.   PAST SURGICAL HISTORY:  1.  Right shoulder replacement.  2.  Pelvic surgery and possible bladder tuck.  3.  Recent left shoulder replacement.   FAMILY HISTORY:  Significant for diabetes and heart disease.   REVIEW OF SYSTEMS: CONSTITUTIONAL:  The patient denies fever, chills.  Complains of fatigue and weakness.  EYES:  Denies blurry vision, double vision, pain or redness or inflammation.  EARS, NOSE,  THROAT:  Denies tinnitus, ear pain, hearing loss, epistaxis.  Has some nasal discharge.  RESPIRATORY:  Denies hemoptysis, painful respiratory, complains of cough with productive thick white sputum.  Has wheezing and dyspnea.  CARDIOVASCULAR:  Has musculoskeletal chest pain for a few [place].  Denies any orthopnea, edema, arrhythmia, palpitations, syncope.  GASTROINTESTINAL:  Denies nausea, vomiting, diarrhea, abdominal pain, hematemesis, jaundice, rectal bleed or hernia.  GENITOURINARY:  Denies dysuria,  hematuria, renal colic.  ENDOCRINE:  Denies polyuria, polydipsia, heat or cold intolerance.  HEMATOLOGY:  Denies anemia, easy bruising, bleeding diathesis.  INTEGUMENT:  Denies acne, rash, had easy bruising where her cardiologist stopped her aspirin.  MUSCULOSKELETAL:  Complains of chronic lower back pain, limited activity and arthritis.  Denies any gout or cramps.  NEUROLOGIC:  Denies CVA, TIA, seizures, memory loss, headache, ataxia, vertigo.  PSYCHIATRIC:  Has history of anxiety and insomnia.  Denies substance, alcohol abuse or schizophrenia.   PHYSICAL EXAMINATION: VITAL SIGNS:  Temperature 98.2, pulse 81, respiratory rate 32, blood pressure 174/77, saturating 96% on 3.5 liters nasal cannula.  GENERAL:  Chronically ill-appearing female, sitting in bed, in mild respiratory distress.  HEENT:  Head atraumatic, normocephalic.  Pupils equal, round and reactive to light and accommodation.  Extraocular movements intact.  Oral mucosa is moist.  NECK:  Supple.  No thyromegaly.  No JVD.  No carotid bruit.  RESPIRATORY:  The patient had decreased air entry with diffuse wheezing, and scattered rales.  CARDIOVASCULAR:  S1 and S2 heard.  No rubs, murmurs, gallops.  CHEST:  Has tenderness to palpation in the midsternal area, around previous CABG site, but no erythema or swelling.  EXTREMITIES:  Good pedal pulses, good femoral pulses.  No lower extremity edema.  No clubbing.  No cyanosis.  ABDOMEN:  Soft, nontender, nondistended.  Bowel sounds present.  NEUROLOGIC:  Cranial nerves grossly intact.  Motor 5 out of 5.  No focal deficits.   SKIN:  Warm and dry.  Normal skin turgor.  PSYCHIATRIC:  Appropriate affect.  Awake, alert x 3.  Intact judgment and insight.   PERTINENT LABORATORY DATA:  Glucose 250, pro-BNP 588, BUN 17, creatinine 0.73, sodium 140, potassium 3.4, chloride 104, CO2 31.  Troponin less than 0.02, CK-MB 0.9, CK total 39.  White blood cells 6.5, hemoglobin 12.5, hematocrit 36.6, platelets  188.  Chest x-ray does not show any acute infiltrate.   ASSESSMENT AND PLAN: 1.  Chronic obstructive pulmonary disease exacerbation, the patient presents with shortness of breath, productive sputum and diffuse wheezing, will be started on IV Solu-Medrol, DuoNebs every 4 hours and albuterol as needed every 2 hours given she has productive cough.  She will be started on levofloxacin.  We will continue her on O2 to reach O2 sat 95.  2.  Tobacco abuse.  The patient was counseled at length, and agreeing to start on nicotine patch and expresses her wishes to quit smoking.  3.  Hypertension, uncontrolled.  She did not take her evening medications, so we will resume the patient's home medication, Imdur, beta-blockers, and ramipril, as well we will add as needed IV hydralazine.  4.  Musculoskeletal chest pain, seems to be chronic going on for a few [place], seen by her cardiologist for that, he stopped her aspirin as he started her on naproxen, we will continue to cycle cardiac enzymes.  5.  Diet-controlled diabetes, we will continue to monitor patient's fingersticks, especially she is on large doses of steroids and if needed we will start her on insulin  sliding scale.  6.  Chronic back pain.  We will continue on as needed pain medications.  7.  History of coronary artery disease, the patient currently does not have any chest pain, had recent coronary artery bypass graft at [facility], followed by her cardiologist in [place], she is on statin, beta-blockers, ramipril, Imdur, currently aspirin is being held by her primary cardiologist secondary to her easy bruising and she is on NSAIDs.  8.  Deep vein thrombosis prophylaxis.  Subcutaneous heparin.  9.  Gastrointestinal prophylaxis.  On Protonix, especially she is on large dose of Solu-Medrol.   10.  CODE STATUS:  The patient reports she is having and a LIVING WILL, and she reports she is a FULL CODE, but if she becomes dependent on life support she does not want  that.   Total time spent on admission and patient care 60 minutes.     ____________________________ Starleen Arms, MD dse:ea D: 08/03/2012 01:35:43 ET T: 08/03/2012 02:27:35 ET JOB#: [id number]  cc: Starleen Arms, MD, <Dictator> Tilman Mcclaren Teena Irani MD ELECTRONICALLY SIGNED 08/04/2012 6:19

## 2014-07-23 NOTE — Discharge Summary (Signed)
PATIENT NAME:  [name], [name] MR#:  [id number] DATE OF BIRTH:  08-09-1931  DATE OF ADMISSION:  08/03/2012 DATE OF DISCHARGE:  08/05/2012  PRIMARY CARE PHYSICIAN: Dr. Patsy Lager.  DISCHARGE DIAGNOSES: Chronic obstructive pulmonary disease exacerbation, chronic respiratory failure, hypertension, diabetes, tobacco abuse.   CONDITION: Stable.  CODE STATUS: FULL CODE.   HOME MEDICATIONS: Please refer to the [facility] discharge instructions medication reconciliation list.   NEW MEDICATIONS: Nicotine patch 21 mg per 24 hour transdermal film, 1 patch transdermal once a day, prednisone 10 mg p.o. 2 tablets 1 per day for 2 days, 1 tablet p.o. daily for 2 days, Spiriva 18 mcg inhalation, 1 inhaled each once a day, albuterol CFC-free, 90 mcg inhalation,  2 puffs 4 times a day p.r.n. for shortness of breath.   Otherwise, continue the patient's home medication. The patient needs home health home, home oxygen, 2 liters by nasal cannula.   DIET: Low-sodium, low-fat, low-cholesterol, ADA diet.   ACTIVITY: As tolerated.   FOLLOWUP CARE: Follow up with PCP within 1 to 2 Sam.   REASON FOR ADMISSION: Shortness of breath, cough.   HOSPITAL COURSE: The patient is a 81 year old Caucasian female with a history of COPD on as-needed home oxygen, tobacco abuse, hypertension, CAD, presented to the ED with progressively worsening shortness of breath and a cough with productive thick, white sputum.    For detailed history and physical examination, please refer to the admission note dictated by  Dr. Randol Kern.   On admission date the patient's proBNP was 588, BUN 17, creatinine 0.73. Electrolytes were normal. Troponin was less than 0.02.   Chest x-ray did not show any infiltrate. The patient was admitted for COPD exacerbation with chronic respiratory failure. After admission the patient has been treated with Solu-Medrol, Duo-Nebs  and oxygen by nasal cannula. The patient's symptoms have much-improved after treatment.    Hypertension: The patient's hypertension has been treated with home medications; has been stable.   Tobacco abuse: The patient was counseled for smoking cessation.   This patient is clinically stable. The patient is discharged to home today. I discussed the patient's discharge plan with the patient and the case manager.   The patient needs home health.   Time spent: About 35 minutes.    ____________________________ Shaune Pollack, MD qc:dm D: 08/05/2012 14:47:32 ET T: 08/05/2012 15:40:55 ET JOB#: [id number]  cc: Shaune Pollack, MD, <Dictator> Shaune Pollack MD ELECTRONICALLY SIGNED 08/05/2012 16:28

## 2014-07-23 NOTE — Consult Note (Signed)
PATIENT NAME:  [name], GIAMBRA MR#:  [id number] DATE OF BIRTH:  Nov 07, 1931  DATE OF CONSULTATION:  08/27/2012  REFERRING PHYSICIAN:   CONSULTING PHYSICIAN:  Ilisha Blust A. Floree Zuniga, MD  REASON FOR CONSULTATION:  Gastrointestinal bleed, positive tagged red blood cell scan.   HISTORY OF PRESENT ILLNESS:  [name] is a pleasant 81 year old female with a history of COPD with as needed oxygen, coronary artery disease status post recent CABG at [facility], hypertension, history of diet-controlled diabetes, chronic back pain, arthritis.  She in 2013 had a large GI bleed and underwent mesenteric angiography with Dr. Gilda Crease which controlled bleed.  She has not had any bleeding since then.  She did wake up this morning with a bloody bowel movement and has since had multiple bloody bowel movements.  She thought it was initially slowing down, but she recently had a large bowel movement.  She said she had no lightheadedness or other symptoms associated with hemodynamic instability.  She had a hemoglobin of 12.6 earlier in May when she was admitted for COPD exacerbation.  She is currently in the critical care unit and otherwise no headaches, chest pain, shortness of breath, cough, fevers, chills, nausea, vomiting, constipation, dysuria or hematuria.  She is then evaluated by Dr. Gilda Crease who does not feel that repeated angioembolization would be warranted as well as by Dr. Marva Panda who does not feel that colonoscopy is warranted.   PAST MEDICAL HISTORY:   1.  Coronary artery disease status post CABG.  2.  Previous GI diverticular bleed status post IMA embolization in 2013.  3.  Hypertension.  4.  Diabetes.  5.  COPD. 6.  Arthritis.  7.  Chronic pain.  8.  History of splenic aneurysm.  9.  Restless leg syndrome.  10.  Anemia of chronic disease.  11.  History of right and left shoulder replacement.  12.  Pelvic surgery with bladder tuck.  ALLERGIES:  ANTIBIOTICS, MACROLIDES AND IRON PILLS.    HOME  MEDICATIONS:  Percocet as needed pain, aspirin 81 daily, ramipril 5 mg daily, gabapentin 300 mg daily, atorvastatin 40 mg daily, Apresoline 0.25 as needed anxiety, bisoprolol 5 mg by mouth daily, fish oil 1000 mg a day, melatonin 5 mg by mouth daily, multivitamins by mouth daily.   SOCIAL HISTORY:  Lives by herself, is with her granddaughter.  Daughter is her power of attorney.  History of previous tobacco use, none currently.  Denies any alcohol.   FAMILY HISTORY:  Diabetes, heart disease.   REVIEW OF SYSETMS: A 12 point review of systems obtained.  Pertinent positives and negatives as above.   PHYSICAL EXAMINATION: VITAL SIGNS:  Temperature 99.3, pulse 68, blood pressure 158/68, respirations 96%.  GENERAL:  No acute distress.  Alert and oriented x 3.   EYES:  No scleral icterus.  No conjunctivitis.  FACE:  No obvious facial trauma.  Normal external nose.  Normal external ears. CHEST:  Lungs clear to auscultation, moving air well.  HEART:  Regular rate and rhythm.  No murmurs, rubs or gallops.  ABDOMEN:  Soft, nontender, nondistended.  EXTREMITIES:  Moves all extremities well.  Strength 5 out of 5.  NEUROLOGIC:  Cranial nerves II through XII grossly intact.  Sensation intact to all four extremities.    LABORATORY DATA:  Hemoglobin was 10.8 at 9:27 this morning, down from 12.6 back on May 6th.  Follow-up hemoglobin is 8.8, so down 2 points total since admission.  Labs are otherwise unremarkable.  Is getting 2 units of  blood, has just finished first unit.  Urinalysis shows 2+ leukocyte esterase, 20 whites, 4 red blood cells.   IMAGING STUDIES:  Nuclear medicine scan shows bleeding in the distribution of the distal descending colon, proximal sigmoid colon.   ASSESSMENT AND PLAN:  [name] is a pleasant 81 year old female with a history of diverticular bleed, now with recurrent bleed.  Positive tagged red blood cell scan at 11:00 this morning, did appear to be doing better from bloody bowel  movements, but they have increased, is a poor operative candidate, but she has expressed her wishes that if surgery needs to be done she would be okay with that, but would also be okay with short-term postoperative ventilation, but would not want to be ventilated for greater than one week.  Also would not want tracheostomy.  I have informed her that I am not certain if she would require an ostomy and would not agree to surgery without possible ostomy.  We will continue to  monitor.  We will go to urgent operation if becomes hemodynamically unstable and we will continue to assess bleeding.  Would ideally like to avoid operating if possible.  If she makes it through this without need for emergent surgery, we will discuss with her possible plans in the future.    ____________________________ Si Raider Faye Strohman, MD cal:ea D: 08/27/2012 21:01:22 ET T: 08/27/2012 23:05:57 ET JOB#: [id number]  cc: Cristal Deer A. Kishan Wachsmuth, MD, <Dictator> Jarvis Newcomer MD ELECTRONICALLY SIGNED 08/28/2012 20:39

## 2014-07-23 NOTE — Discharge Summary (Signed)
PATIENT NAME:  [name], [name] MR#:  [id number] DATE OF BIRTH:  02/09/1932  DATE OF ADMISSION:  08/27/2012 DATE OF DISCHARGE:  08/31/2012  PRESENTING COMPLAINT:  Rectal bleed.   DISCHARGE DIAGNOSES: 1.  Diverticular bleed. 2.  Posthemorrhagic anemia, status post 3 units blood transfusion.  3.  Hypertension.  4.  Urinary tract infection.  5.  Coronary artery disease.  CODE STATUS:  FULL CODE.   MEDICATIONS AT DISCHARGE: 1.  Bisoprolol 5 mg daily.  2.  Percocet 10/325 1 to 2 q. 6 p.r.n.  3.  Alprazolam 0.25 mg b.i.d. p.r.n. 4.  Imdur SA 30 mg daily. 5.  Melatonin 6 mg p.o. daily.  6.  Atorvastatin 40 mg daily.  7.  Gabapentin 300 mg at bedtime.  8.  MVI daily.  9.  Omega-3 fatty acid 1 gram daily.  10.  Ramipril 5 mg daily.  Follow up with Dr. Kirke Corin in 1 to 2 [place]. Follow up with Dr. Marva Panda in 1 to 2 [place].  Dr. Michela Pitcher in 2 to 4 [place].  Hemoglobin at discharge was 9.0. White count is 5.2.  Echo Doppler showed left ventricular ejection fraction of 55% to 60%. Normal global. LV systolic function impaired. Relaxation pattern of LV diastolic feeling. Mild constant left ventricular hypertrophy. Mildly dilated left atrium. Mild aortic valve sclerosis without stenosis.  Cardiac enzymes x 3 negative.  EKG normal sinus rhythm with LVH with repolarization abnormality. Basic metabolic panel within normal limits.   CONSULTATIONS: 1.  Cardiology, Dr. Kirke Corin. 2.  Surgery, Dr. Michela Pitcher. 3.  GI, Dr. Bluford Kaufmann, Dr. Marva Panda.  BRIEF SUMMARY OF HOSPITAL COURSE:  Ms. Claytor is a pleasant 81 year old Caucasian female who has history of diverticulosis, came in with:  1.  Lower GI bleed, suspected diverticular.  The patient had a positive GI bleed scan. She was admitted on the medical floor, started on IV fluids, kept n.p.o., received 2 units of blood transfusion for symptomatic anemia in the setting of chest pain and CAD. The patient was continued on her beta blockers. Dr. Michela Pitcher discussed surgical options given  recurrent bleed. The patient did not want to have surgery this time. She did understand the risk of rebleed and may need emergent surgery.  2.  Urinary tract infection, completed Rocephin.  3.  Coronary artery disease status post CABG.  Resume beta blockers. Did not have any more chest pain. She was seen by Dr. Kirke Corin.  4.  Chronic obstructive pulmonary disease. The patient remained stable.  Hospital stay otherwise remained stable.  THE PATIENT REMAINED A FULL CODE.   TIME SPENT:  40 minutes   ____________________________ Jearl Klinefelter A. Allena Katz, MD sap:ce D: 09/04/2012 12:41:22 ET T: 09/04/2012 12:50:44 ET JOB#: [id number]  cc: Taresa Montville A. Allena Katz, MD, <Dictator> Willow Ora MD ELECTRONICALLY SIGNED 09/04/2012 19:42

## 2014-07-24 NOTE — Discharge Summary (Signed)
PATIENT NAME:  [name], [name] MR#:  [id number] DATE OF BIRTH:  1932/02/24  DATE OF ADMISSION:  12/06/2013 DATE OF DISCHARGE:  12/07/2013  PRIMARY CARE PHYSICIAN: Hannah Beat, MD  FINAL DIAGNOSES:  1.  Brief episode of atrial fibrillation, converted to normal sinus rhythm.  2.  Hypomagnesemia and hypokalemia.  3.  Hypertension.  4.  Possible sinus infection.  5.  Chronic obstructive pulmonary disease.  6.  Chronic pain.   MEDICATIONS ON DISCHARGE: Include atorvastatin 40 mg in the morning, oxycodone 50 mg every 4-6 hours, morphine extended-release 30 mg every 12 hours, ibuprofen 200 mg once a day as needed for pain, ramipril 10 mg daily, fish oil 1000 mg daily, aspirin 81 mg daily, Levaquin 500 mg once a day for 9 more days, metoprolol extended release 25 mg daily, magnesium oxide 400 mg at bedtime,  K-Tab 20 mEq 1 tablet x 5 days. Stop taking hydrochlorothiazide.   DIET: Low sodium diet, regular consistency.   ACTIVITY: As tolerated.   FOLLOWUP: With Dr. Karleen Hampshire Copland in 1 week.   HOSPITAL COURSE: The patient was admitted 12/06/2013, discharged 12/07/2013. The patient came in with atrial fibrillation and was given 1 dose of Lovenox, converted to normal sinus rhythm. Electrolytes for a low potassium and low magnesium were replaced.   LABORATORY AND RADIOLOGICAL DATA DURING THE HOSPITAL COURSE: Included a TSH of 2.16, magnesium 1.6, glucose 103, BUN 20, creatinine 1.18. Sodium 137, potassium 3.4, chloride 100, CO2 of 30. White blood cell count 8.1, H and H 11.6 and 34.8, platelet count of 190,000. Troponin negative. Chest x-ray included elevation of the right hemidiaphragm with atelectasis in the right lung base. Next 2 troponins were negative. Echocardiogram showed an EF of 55% to 60%, impaired relaxation of left ventricular diastolic filling, LVH, mildly dilated left atrium, mild to moderate mitral annular calcification, mild to moderate aortic valve sclerosis without evidence of  stenosis. Repeat potassium 3.4, repeat magnesium 1.5   HOSPITAL COURSE PER PROBLEM LIST:  1.  For the patient's brief episode of atrial fibrillation, this converted to normal sinus rhythm with hydration and the patient was monitored on telemetry. No episodes of atrial fibrillation after conversion. The patient was placed on metoprolol extended release.  2.  Hypomagnesemia and hypokalemia. Two runs of IV magnesium were given during the hospital stay, IV potassium in the IV fluids. I stopped the patient's hydrochlorothiazide and I think this is the culprit with the patient's electrolyte abnormalities. I will give 400 mg of magnesium and 5 days' worth of potassium upon discharge.  3.  Hypertension. Blood pressure variable with pain and mood. Continue Toprol and ramipril.  4.  Possible sinus infection. Continue Levaquin.  5.  COPD. Respiratory status stable.  6.  Chronic pain. Has pain medications at home.   TIME SPENT ON DISCHARGE: Thirty-five minutes.    ____________________________ Herschell Dimes. Renae Gloss, MD rjw:TT D: 12/07/2013 14:18:57 ET T: 12/07/2013 19:16:29 ET JOB#: [id number]  cc: Herschell Dimes. Renae Gloss, MD, <Dictator> Hannah Beat, MD Salley Scarlet MD ELECTRONICALLY SIGNED 12/15/2013 12:26

## 2014-07-24 NOTE — H&P (Signed)
PATIENT NAME:  [name], SCHIRMER MR#:  [id number] DATE OF BIRTH:  1931/06/29  DATE OF ADMISSION:  12/06/2013  REFERRING PHYSICIAN:   Sink. Dolores Frame, MD.  PRIMARY CARE PHYSICIAN:  Hannah Beat, MD.   ADMISSION DIAGNOSIS:  Atrial fibrillation and chest pain.   HISTORY OF PRESENT ILLNESS: This is an 81 year old Caucasian female who presents to Emergency Room complaining of chest tightness. The patient adamantly denies that she is in chest pain. She has had heart attacks in the past and states that this sensation is dramatically different. She says it is accompanied by an intense feeling of "nervousness."  Upon arrival the patient was found to be in atrial fibrillation without rapid ventricular rate. For her chest pain she was given nitroglycerin sublingually as well as Nitropaste which resolved the pain, which had been essentially sustained for 2 hours. The patient spontaneously converted to normal sinus rhythm but required rule out for myocardial ischemia, which prompted admission to the hospital.   REVIEW OF SYSTEMS:  CONSTITUTIONAL: The patient denies fever or weakness.  EYES: The patient denies diplopia or inflammation.  ENT: The patient denies tinnitus or sore throat.  RESPIRATORY: The patient denies shortness of breath or cough.  CARDIOVASCULAR: The patient denies chest pain or orthopnea but admits to feeling as though her heart were racing.  GASTROINTESTINAL: The patient denies nausea or vomiting or abdominal pain.  GENITOURINARY: The patient denies dysuria, increased frequency or hesitancy.  ENDOCRINE: The patient denies polyuria or polydipsia.  HEMATOLOGIC AND LYMPHATIC: The patient denies easy bruising or bleeding. INTEGUMENTARY: The patient denies rashes or lesions.  MUSCULOSKELETAL: The patient admits to chronic pain in many of her joints, and particularly her anterior quadriceps of the right leg.  NEUROLOGIC: The patient denies dysarthria but admits to occasional weakness in her right  leg and she complains as well as of lightheadedness with this current episode of chest tightness.  PSYCHIATRIC: The patient denies depression or suicidal ideation.  PAST MEDICAL HISTORY: COPD, coronary artery disease, diet-controlled diabetes type 2, arthritis, restless leg syndrome, and anemia.   PAST SURGICAL HISTORY: Four-vessel coronary artery bypass graft, 3 back surgeries, appendectomy, hysterectomy, open reduction and internal fixation of the right ankle, tonsillectomy with adenoidectomy and bilateral shoulder replacements.   FAMILY HISTORY: Significant for coronary artery disease, and high blood pressure.   SOCIAL HISTORY: The patient does not smoke or do any drugs. She drinks 1 glass of red wine per day.   PERTINENT LABORATORY RESULTS AND RADIOLOGIC FINDINGS: Glucose 103, BUN 20, creatinine 1.18, potassium is 3.4, magnesium is 1.6, troponin negative x2. There is slight increase in CK-MB at 4.3, thyroid stimulating hormone is normal at 2.16, hemoglobin is 11.6, hematocrit is 34.8.    Chest x-ray shows elevation of the right hemidiaphragm with atelectasis in the right lung base.   PHYSICAL EXAMINATION:  VITAL SIGNS: Temperature is 99.4, pulse 93, respirations 20, blood pressure 172/72, pulse oximetry 100% on room air.  GENERAL: The patient is alert and oriented in no apparent distress.  HEENT: Normocephalic, atraumatic. Pupils equal, round, and reactive to light and accommodation. Extraocular movements are intact. Mucous membranes are moist.  NECK: Trachea is midline. No adenopathy.  CHEST: Symmetric and atraumatic.  CARDIOVASCULAR: Regular rate and rhythm. Normal S1, S2. No rubs, clicks, or murmurs.  LUNGS: Expiratory wheezes bilaterally that sometimes clear with cough. Normal effort and excursion.  ABDOMEN: Positive bowel sounds, soft, nontender, nondistended. No hepatosplenomegaly genitalia deferred.  MUSCULOSKELETAL: The patient moves all 4 extremities equally.  SKIN: No  rashes or  lesions.  EXTREMITIES: No clubbing, cyanosis, or edema. NEUROLOGIC: Cranial nerves II through XII are grossly intact.  PSYCHIATRIC: Normal mood. Congruent affect.   ASSESSMENT AND PLAN: This is an 81 year old female with paroxysmal atrial fibrillation and possible anginal equivalents.  1.  Atrial fibrillation. Spontaneous conversion to normal sinus rhythm. The patient has been given a treatment dose of Lovenox. We will continue anticoagulation and monitor the patient on telemetry.  2.  Chest "tightness."  The patient adamantly denies chest pain. Indeed, she does not have the same symptomatology as she did with her previous myocardial infarctions. Nonetheless, we will continue to cycle the patient's cardiac enzymes. She has not had any ischemic changes on her EKG and her first series of cardiac enzymes is reassuring. Will complete a rule-out for myocardial ischemia and get a cardiology consult at the discretion of the primary treatment team.  3.  Coronary artery disease, stable at this time. Continue statin therapy.  4.  Hypertension. Continue hydrochlorothiazide as well as ramipril.  5.  Chronic pain. Continue oral narcotics.  6.  Deep vein thrombosis prophylaxis. SCDs, the patient is already on therapeutic Lovenox.  7.  Gastrointestinal prophylaxis. Continue pantoprazole.   CODE STATUS:  The patient is a full code.   Time spent on admission orders and patient care:  Approximately 35 minutes.    ____________________________ Kelton Pillar. Sheryle Hail, MD msd:lt D: 12/06/2013 07:07:10 ET T: 12/06/2013 07:23:01 ET JOB#: [id number]  cc: Kelton Pillar. Sheryle Hail, MD, <Dictator> Kelton Pillar Jakeim Sedore MD ELECTRONICALLY SIGNED 12/08/2013 2:35

## 2014-07-25 NOTE — Consult Note (Signed)
PATIENT NAME:  [name], [name] MR#:  [id number] DATE OF BIRTH:  [birth year]  DATE OF CONSULTATION:  04/18/2011  REFERRING PHYSICIAN:  Enedina Finner, MD   CONSULTING PHYSICIAN:  Christena Deem, MD  REASON FOR CONSULTATION: Hematochezia.   HISTORY OF PRESENT ILLNESS: [name] is a 81 year old female with whom I am familiar. I have seen her in the past in regards to some outpatient issues. She states that she has been doing quite well at home until yesterday morning. At that time, when she awoke she passed a bright red stool and then this changed to maroon over the course of the morning. She came to the hospital. She has been having some problems with constipation recently and has been taking fiber regularly. She states that, "If I didn't take fiber I wouldn't go." She stated that one night last week she awoke with some light suprapubic discomfort, but this has not recurred. She denies any nausea, vomiting, heartburn, dysphagia. Her appetite has been good. She has had some mild weight loss, but this has been intentional for her. She does have a more recent history of a heart attack in August 2012 and has been taking an aspirin daily. Her last EGD and colonoscopy were done on 11/08/2006. At that time, her EGD did show several small gastric arteriovenous malformations. These appeared deep to the mucosa and did not require treatment. She also had in her colon on that same date multiple diverticula as well as a single polyp being removed. Biopsy on this was consistent with a tubular adenoma. She would not be due for repeat colonoscopy until 2013. I had initially discussed the patient with Dr. Allena Katz last night. The rectal bleeding was apparently bright red in nature. Initial indication was possibly diverticular versus anal outlet. Hemoglobin on initial check was at previous baseline, and she was hemodynamically stable. I had advised that she have serial hemoglobins done, and if she should have further bleeding  overnight a bleeding scan would be needed with Vascular Surgery consultation. Overnight, indeed, she did have multiple episodes. A bleeding scan was done that did show a positive. When I saw the patient this morning, she was awaiting a blood transfusion; and she had been seen by Vascular Surgery with plans for a microembolization later in the day. She has been hemodynamically stable. She is also on an IV PPI.   PAST MEDICAL HISTORY: As noted above.  1. Recent non ST-elevation myocardial infarction. She did not undergo any stenting.  2. History of hypertension.  3. Diet-controlled diabetes.  4. Chronic obstructive pulmonary disease. She continues to smoke.  5. Chronic back pain.  6. Arthritis.  7. Splenic aneurysm.  8. Restless leg syndrome. 9. Anemia of chronic disease. This is possibly related with arteriovenous malformations that may be further existing in the small intestine.   OUTPATIENT MEDICATIONS:  1. Acetaminophen/oxycodone 325/10 mg. 2. Alprazolam 0.5 mg at bedtime.  3. Aspirin 81 mg daily.  4. Colon Health tablet.  5. Co-Q10 once a day. 6. Imdur 30 mg once a day in the morning. 7. Lipitor 40 mg once a day. 8. Metoprolol succinate 25 mg, 1 b.i.d.   9. Mucinex once a day. 10. Plavix 75 mg once a day. 11. Ramipril 2.5 mg once a day.  12. Vitamin B12, vitamin C, vitamin D3.   ALLERGIES: She is allergic to certain iron preparations, macrolide antibiotics.   PHYSICAL EXAMINATION:  VITAL SIGNS: Temperature 98.6, pulse 72, respirations 19, blood pressure 127/57, pulse oximetry 99%.  GENERAL: She is an elderly-appearing 81 year old Caucasian female in no acute distress.   HEENT: Head: Normocephalic, atraumatic. Eyes: Anicteric. Nose: Septum midline. No lesions. Oropharynx:  No lesions.   NECK: Supple. No JVD.   HEART: Regular rate and rhythm.   LUNGS: Bilaterally clear.   ABDOMEN: Soft, nontender, and nondistended. Bowel sounds are positive, normoactive. There is no  apparent organomegaly or masses felt.  ANORECTAL: Exam is deferred.   EXTREMITIES: No clubbing, cyanosis, or edema.   NEUROLOGICAL: Cranial nerves II through XII are grossly intact. Muscle strength is bilaterally equal and symmetric, 5/5. Deep tendon reflexes are bilaterally equal and symmetric.   LABORATORY, DIAGNOSTIC AND RADIOLOGICAL DATA:  From last night: She had a glucose of 112, BUN 23, creatinine 0.86, sodium 145, potassium 3.5, chloride 107, bicarbonate 28, magnesium 1.6, lipase 67. She had a hepatic profile which was normal. She had a troponin I x1 last night that was normal. She had a hemogram showing a white count of 8.8, hemoglobin and hematocrit of 10.8/33.3, platelet count of 182. MCV is 92. Her next hemoglobin was done close to 11:00 p.m. last night at 9.2. This morning at 03:18 it was 7.5. Her BUN had improved to 21 overnight, and her creatinine was 0.81.  She had a portable chest film which showed some discoid atelectasis right lung base, marked elevation of the right hemidiaphragm. The aorta was tortuous, ectatic. She had a bleeding scan done last night as well, this showing a positive source appearing to be in the region of the descending colon.   ASSESSMENT: Hematochezia, lower GI bleed likely consistent with diverticular bleeding. 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 She has been hemodynamically stable overnight and is currently undergoing transfusion. Plans for vascular procedure are noted as above. I appreciate Vascular Surgery's assistance.   RECOMMENDATIONS:  1. We will continue to hold 2 units of packed red cells ahead should they be needed.  2. I agree with IV PPI.  3. Serial hemoglobins.  4. We will follow with you.  ____________________________ Christena Deem, MD mus:cbb D: 04/18/2011 17:29:44 ET T: 04/18/2011 17:38:15  ET JOB#: [id number]  cc: Christena Deem, MD, <Dictator> Antonieta Iba, MD Christena Deem MD ELECTRONICALLY SIGNED 04/24/2011 17:26

## 2014-07-25 NOTE — Consult Note (Signed)
Patient admitted with lower GI bleed.  Has had 2 bloody BM since admission.at bleeding scan now.  If positive, would consider embolization.  Currently hemodynamically stable.  Will follow.  Electronic Signatures: Annice Needy (MD)  (Signed on 15-Jan-13 23:56)  Authored  Last Updated: 15-Jan-13 23:56 by Annice Needy (MD)

## 2014-07-25 NOTE — H&P (Signed)
PATIENT NAME:  [name], DEDOMINICIS MR#:  [id number] DATE OF BIRTH:  [birth year]  DATE OF ADMISSION:  04/17/2011  PRIMARY CARE PHYSICIAN: Arta Silence, MD   CHIEF COMPLAINT: Bright red blood per rectum today.   HISTORY OF PRESENT ILLNESS: [name] is a 81 year old Caucasian female with multiple medical problems who comes to the Emergency Room from home after she noticed bright red blood per rectum this morning at home. She also noticed some clots and described them as "strawberry jelly." She denies any abdominal pain, denies dizziness, nausea, vomiting, chest pain or shortness of breath. The patient had another episode in the Emergency Room which was mild-to-moderate and he is being admitted for rectal/lower GI bleed. The patient is hemodynamically stable.    PAST MEDICAL HISTORY: 1. History of recent non ST-elevation myocardial infarction, status post cardiac catheterization revealing diffuse RCA disease which was suspected to be the culprit vessel, however, not amenable for any stenting. The patient also had 75% stenosis of LAD noted and did undergo an outpatient stress test in September of 2012 by Dr. Mariah Milling which did not show any evidence of ischemia.  2. Hypertension.  3. Diet-controlled diabetes.  4. Chronic obstructive pulmonary disease.  5. Ongoing smoking,  6. Chronic back pain.  7. Arthritis.  8. History of splenic aneurysm being followed with regular CT scans by primary care physician.  9. Restless leg syndrome.  10. Anemia of chronic disease.   ALLERGIES: Iron pills, macrolide antibiotics.   CURRENT MEDICATIONS:  1. Xanax 0.5 mg at bedtime as needed.  2. Percocet 10/325, 1 to 2 every 6 hours as needed.  3. Lipitor 40 mg daily.  4. Aspirin 81 mg, 2 tablets daily.  5. Ramipril 2.5 mg daily.  6. Metoprolol 25 mg b.i.d.  7. Vitamin D3, 1 tablet daily.  8. Vitamin C, 1 tablet daily.  9. Vitamin B12, 1 tablet daily.  10. Colon Health 1 tablet daily.  11. Mucinex as needed for  cough.  12. Fiber capsules over-the-counter 2 pills daily.  13. Fish oil 1 capsule daily.   SOCIAL HISTORY: The patient lives at home by herself. She has history of smoking. No alcohol abuse.   PAST SURGICAL HISTORY:  1. Right shoulder replacement.  2. Pelvic surgery, possible bladder tack.  3. Recent left shoulder replacement.   FAMILY HISTORY: Diabetes and heart disease runs in the family.   REVIEW OF SYSTEMS: CONSTITUTIONAL: No fever, fatigue, weakness. EYES: No blurred or double vision. Glaucoma. ENT: No tinnitus, ear pain, hearing loss. RESPIRATORY: Negative for cough, wheeze, or hemoptysis. CARDIOVASCULAR: No chest pain, orthopnea, or edema. GASTROINTESTINAL: No nausea, vomiting, diarrhea, abdominal pain or gastroesophageal reflux disease. GENITOURINARY: No dysuria or hematuria. ENDOCRINE: No polyuria or nocturia. HEMATOLOGY: Positive for chronic anemia. SKIN: No acne or rash. MUSCULOSKELETAL: Positive for arthritis. NEUROLOGICAL: No cerebrovascular accident or transient ischemic attack. PSYCHIATRIC: No anxiety or depression. All other systems reviewed and negative.   PHYSICAL EXAMINATION:  GENERAL: The patient is awake, alert, and oriented x3, not in acute distress.   VITAL SIGNS: She is afebrile, pulse is 62, blood pressure 178/66, pulse oximetry is 95% on room air.   HEENT: Atraumatic, normocephalic. Pupils are equal, round, and reactive to light and accommodation. Extraocular movements intact. Oral mucosa is moist.   NECK: Supple. No JVD. No carotid bruit.   RESPIRATORY: Clear to auscultation bilaterally. No rales, rhonchi, respiratory distress, or labored breathing.   CARDIOVASCULAR: Both the heart sounds are normal. Rate, rhythm is regular. PMI is  not lateralized. Chest is nontender.   EXTREMITIES: Good pedal pulses, good femoral pulses. No lower extremity edema.   ABDOMEN: Soft, benign, and nontender. No organomegaly. No rebound, guarding or rigidity.   RECTAL: Hemoccult  strongly positive for bright red blood per rectum.   SKIN: Warm and dry.   NEUROLOGICAL: Grossly intact cranial nerves II through XII.   LABORATORY, DIAGNOSTIC AND RADIOLOGICAL DATA:  Urinalysis is negative for urinary tract infection.  Hemoglobin and hematocrit is 10.8 and 33.3, white count is 8.8, platelet count is 182, MCV is 92.  Lipase is 67, glucose is 112, BUN 23, creatinine 0.86, sodium is 145, potassium 3.6.  LFTs are within normal limits. PT-INR is 13.6 and 1.0.  Magnesium is 1.6.  Troponin is 0.03.  EKG shows normal sinus rhythm, moderate voltage criteria left ventricular hypertrophy.   ASSESSMENT: [name] is a 81 year old with:  1. Rectal bleed/lower gastrointestinal bleed, suspected diverticular bleeding. The patient has no abdominal pain or fever. She has bright red blood per rectum with clots. Her colonoscopy done in 2008 showed multiple diverticula throughout the colon. She did have a repeat colonoscopy two years ago at [facility], the results not available. The patient at that time was admitted with severe C. difficile. EGD in 2008 showed gastric angiectasias.  2. Anemia, chronic, baseline hemoglobin around 10.4.  3. Coronary artery disease with a history of non-ST myocardial infarction last August 2012 with cardiac catheterization without intervention. Medical management was recommended. Repeat stress test in September 2012 without evidence of ischemia. The patient is only on aspirin 162 mg. She is off Plavix.  4. Hypertension.  5. Diet controlled diabetes.  6. Chronic obstructive pulmonary disease, appears stable.   PLAN:  1. Admit the patient to a Medical floor.  2. IV fluids.  3. We will give the patient a clear liquid diet.  4. We will monitor hemoglobin every 8 hours, transfuse as needed, especially if hemoglobin drops below 8.5 in the setting of coronary artery disease and recent non-Q-wave myocardial infarction.  5. Continue all home medications except hold off on  aspirin.  6. GI consultation with Dr. Marva Panda. The case was discussed with Dr. Marva Panda, who will see the patient in consultation.  7. We will get a bleeding scan if the patient has further rectal bleed to look at the site for bleeding.  8. Cycle cardiac enzymes x3.  9. Deep vein thrombosis prophylaxis with TEDs and SCDs.  10. Further work-up according to the patient's clinical course.   The hospital admission plan was discussed with the patient. No family members were present in the Emergency Room.   TIME SPENT: 55 minutes.  ____________________________ Wylie Hail Allena Katz, MD sap:cbb D: 04/17/2011 17:49:55 ET T: 04/17/2011 18:04:12 ET JOB#: [id number]  cc: Makael Stein A. Allena Katz, MD, <Dictator> Arta Silence, MD Willow Ora MD ELECTRONICALLY SIGNED 04/27/2011 7:29

## 2014-07-25 NOTE — Op Note (Signed)
PATIENT NAME:  [name], [name] MR#:  [id number] DATE OF BIRTH:  17-Jul-1931  DATE OF PROCEDURE:  04/18/2011  PREOPERATIVE DIAGNOSIS: Massive gastrointestinal bleed.   POSTOPERATIVE DIAGNOSIS: Massive gastrointestinal bleed.     PROCEDURES PERFORMED:  1. Abdominal aortogram.  2. Selective inferior mesenteric angiography, third order catheter placement.  3. Embolization for control of gastrointestinal bleed, the inferior mesenteric artery as well as a sigmoid artery, third order catheter placement.   SURGEON: Levora Dredge, M.D.   SEDATION: Versed 1 mg plus fentanyl 50 mcg administered IV. Continuous ECG, pulse oximetry and cardiopulmonary monitoring is performed throughout the entire procedure by the interventional radiology nurse. Total sedation time is approximately one hour.   ACCESS: 5 French sheath, right common femoral artery.  CONTRAST USED: Isovue 45 mL.  FLUOROSCOPY TIME: 9.7 minutes.   INDICATIONS: [name] presented to the hospital with significant hemorrhage, hypotension. This has been ongoing and a bleeding scan has demonstrated a focal area in the mid sigmoid. Risks and benefits for angiography and the potential for embolization was reviewed. All questions answered. The patient has agreed to proceed.   DESCRIPTION OF PROCEDURE: The patient is taken to special procedures and placed in the supine position. After adequate sedation is achieved, the groins are prepped and draped in a sterile fashion. Ultrasound is placed in a sterile sleeve. Ultrasound is utilized secondary to lack of appropriate landmarks to avoid vascular injury. Under direct ultrasound visualization, the common femoral artery is identified. It is echolucent and pulsatile indicating patency. Image is recorded for the permanent record. Under direct real-time visualization, the microneedle is inserted into the anterior wall of the common femoral artery, microwire followed by micro sheath, J-wire followed by 5 French  sheath and 5 French pigtail catheter. The pigtail catheter is positioned at the level of T12 and bolus injection of contrast used to create image of the abdominal aorta in the AP projection.   Magnified views of the distal aorta are then obtained with hand injection. A variety of catheters including a C2, VS-1 and a VS-2 catheter were all used in an attempt to engage the IMA. Ultimately, the VS-2 catheter was the most successful and the VS-2 catheter used to engage the origin of the IMA. Hand injection of contrast then demonstrated the anatomy of the IMA and also demonstrated a blush in the mid sigmoid. The prograde catheter was then opened onto the field, irrigated and advanced through the VS-2 catheter into a sigmoid branch representing third order catheter placement. Follow-up hand injection demonstrated the blush once again and a milliliter of 300-500 micron PVC embolic beads was injected into this distribution. Prograde was  removed and 0.5 mL was injected through the VS-2 catheter into the IMA general distribution.   Follow-up angiography demonstrated a significant reduction in the mucosal blush. No pooling was noted. VS-1 catheter was then removed over wire, oblique view of the right groin obtained, and a StarClose device deployed without difficulty. There are no immediate complications.   INTERPRETATION: Initial views of the aorta demonstrates moderate tortuosity. Bilateral renal arteries are noted. Single renal arteries. No evidence of renal artery stenosis. Normal nephrograms. Distal aorta, common iliacs demonstrate diffuse disease, but there are no flow-limiting stenoses. IMA is noted to be patent with a moderate stenosis at its origin.   Hand injection of contrast through the VS-2 into the IMA demonstrates normal IMA with tertiary branches. Mucosal blush noted in the mid sigmoid. Following embolization, as noted, there is a significant reduction consistent with  adequate  embolization.  ____________________________ Renford Dills, MD ggs:ap D: 04/21/2011 12:15:42 ET T: 04/21/2011 12:57:09 ET JOB#: [id number]  cc: Renford Dills, MD, <Dictator> Christena Deem, MD Arta Silence, MD Renford Dills MD ELECTRONICALLY SIGNED 05/16/2011 11:08

## 2014-07-25 NOTE — Consult Note (Signed)
PATIENT NAME:  [name], [name] MR#:  [id number] DATE OF BIRTH:  10-09-1931  DATE OF CONSULTATION:  04/17/2011  REFERRING PHYSICIAN:  Enedina Finner, MD from [facility] CONSULTING PHYSICIAN:  Annice Needy, MD  REASON FOR CONSULTATION: GI bleed, consideration for embolization.   CHIEF COMPLAINT: Lower gastrointestinal bleeding.  HISTORY OF PRESENT ILLNESS: The patient is a 81 year old female who was admitted with two episodes of brisk bright red blood per rectum. This was painless. She had no associated nausea, vomiting, or diarrhea. She has had no hemodynamic instability to this point but has had bleeding and is now en route to getting her bleeding scan. She has had no previous episode similar to this.   PAST MEDICAL HISTORY:  1. Myocardial infarction last year.  2. Hypertension.  3. Diabetes.  4. Chronic obstructive pulmonary disease.  5. Tobacco dependence.  6. Splenic artery aneurysm.  7. Restless leg syndrome.  8. Anemia of chronic disease.   ALLERGIES: Iron pills and macrolide antibiotics.   HOME MEDICATIONS:  1. Xanax 0.5 mg at bedtime as needed.  2. Percocet as needed.  3. Lipitor 40 mg daily.  4. Aspirin 81 mg b.i.d.  5. Ramipril 2.5 mg daily.  6. Metoprolol 25 mg b.i.d.  7. Fiber capsule over-the-counter daily.  8. Fish oil daily.  9. Multiple vitamins, including D3, C, B12 and Colon Health tablets daily.  SOCIAL HISTORY: She lives at home by herself. Positive tobacco dependence.   FAMILY HISTORY: Diabetes and heart disease in multiple family members.   PAST SURGICAL HISTORY:  1. Right shoulder surgery.  2. Pelvic surgery for bladder.  3. Left shoulder surgery.   REVIEW OF SYSTEMS: CONSTITUTIONAL: No fevers or chills. No intentional weight loss or gain. EYES: No blurred or double vision. EARS: No tinnitus or ear pain. CARDIAC: No chest pain or palpitations. RESPIRATORY: No shortness breath or cough. GASTROINTESTINAL: As per history of present illness. GU: No dysuria or  hematuria. ENDOCRINE: No heat or cold intolerance. HEMATOLOGIC: Positive for chronic anemia. MUSCULOSKELETAL: Positive for arthritis. NEUROLOGIC: No transient ischemic attack, stroke or seizure. PSYCHIATRIC: No anxiety or depression.   PHYSICAL EXAMINATION:  GENERAL: The patient  is an elderly white female who is downstairs just beginning her bleeding scan. She is not in apparent distress.   VITAL SIGNS: Temperature 98.8, pulse 63, blood pressure 155/63, saturations are 97%.   HEENT: Head: Normocephalic, atraumatic. Eyes:  Sclerae are anicteric. Conjunctivae are clear. Ears:  Normal external appearance. Hearing appears intact.   NECK: Neck is supple without adenopathy or jugular venous distention.   HEART: Regular rate and rhythm.   LUNGS: Clear to auscultation bilaterally.   ABDOMEN: Soft, nondistended, nontender.   EXTREMITIES: She has 1+ pedal pulses and minimal lower extremity edema with scattered varicosities.     LABORATORY, DIAGNOSTIC AND RADIOLOGICAL DATA:  Sodium 145, potassium 3.6, chloride 107, CO2 28, BUN 23, creatinine 0.86, glucose is 112.  Her admission hemoglobin was 10.8, with a white count of 8.8, and a platelet count was 182,000. Most recent hemoglobin was 9.2.  INR was 1.0.   ASSESSMENT AND PLAN: The patient is a 81 year old white female with lower gastrointestinal bleeding. I agree with proceeding with a bleeding scan. If her bleeding scan shows evidence of colonic bleeding, then I think consideration for a mesenteric angiogram with possible embolization would be given. The risks and benefits were discussed, and we will follow up after the bleeding scan.   This is a level-4 consultation.   ____________________________ Marlow Baars  Wyn Quaker, MD jsd:cbb D: 04/18/2011 12:33:07 ET T: 04/18/2011 13:10:03 ET JOB#: [id number]  cc: Annice Needy, MD, <Dictator> Annice Needy MD ELECTRONICALLY SIGNED 05/16/2011 11:12

## 2014-07-25 NOTE — Consult Note (Signed)
Chief Complaint:   Subjective/Chief Complaint doing well s/p microembolization for lower GI bleeding. mild llq ab dominal pain/ discomfort. no n/v.  tolerating clears.   VITAL SIGNS/ANCILLARY NOTES: **Vital Signs.:   17-Jan-13 14:00   Pulse Pulse 100   Respirations Respirations 24   Systolic BP Systolic BP 088   Diastolic BP (mmHg) Diastolic BP (mmHg) 72   Mean BP 97   Pulse Ox % Pulse Ox % 95   Pulse Ox Activity Level  At rest   Oxygen Delivery 2L; Nasal Cannula   Pulse Ox Heart Rate 100   Brief Assessment:   Cardiac Regular    Respiratory clear BS    Gastrointestinal details normal Soft  Nondistended  No masses palpable  Bowel sounds normal  No rebound tenderness  No gaurding  mild llq pain to palpation   Routine Hem:  17-Jan-13 04:00    WBC (CBC) 11.6   RBC (CBC) 2.94   Hemoglobin (CBC) 8.9   Hematocrit (CBC) 26.1   Platelet Count (CBC) 111   MCV 89   MCH 30.4   MCHC 34.3   RDW 14.2  Routine Chem:  17-Jan-13 04:00    Glucose, Serum 136   BUN 17   Creatinine (comp) 0.73   Sodium, Serum 143   Potassium, Serum 3.3   Chloride, Serum 105   CO2, Serum 25   Calcium (Total), Serum 8.0   Osmolality (calc) 289   eGFR (African American) >60   eGFR (Non-African American) >60   Anion Gap 13  Routine Hem:  17-Jan-13 04:00    Neutrophil % 84.8   Lymphocyte % 7.2   Monocyte % 7.6   Eosinophil % 0.2   Basophil % 0.2   Neutrophil # 9.8   Lymphocyte # 0.8   Monocyte # 0.9   Eosinophil # 0.0   Basophil # 0.0    12:07    Hemoglobin (CBC) 7.7   Assessment/Plan:  Assessment/Plan:   Assessment 1) lower GI bleed, likely diverticular.  stable s/p angio/microembolization.    Plan 1) will tfx one u prbc, recheck hgb.  will advance diet to full liquids tomorrow, and if tolerated, on to low residue the following day.  Will need colonoscopy in 6-8 Pereyra.  following.   Electronic Signatures: Loistine Simas (MD)  (Signed 17-Jan-13 15:56)  Authored: Chief Complaint, VITAL  SIGNS/ANCILLARY NOTES, Brief Assessment, Lab Results, Assessment/Plan   Last Updated: 17-Jan-13 15:56 by Loistine Simas (MD)

## 2014-07-25 NOTE — Discharge Summary (Signed)
PATIENT NAME:  [name], [name] MR#:  [id number] DATE OF BIRTH:  October 15, 1931  DATE OF ADMISSION:  04/17/2011 DATE OF DISCHARGE:  04/21/2011  ADMITTING DIAGNOSIS: Bright red blood per rectum with two large bowel movements.   DISCHARGE DIAGNOSES:  1. Bright red blood per rectum due to diverticular bleed status post microembolization on 04/18/2011 by vascular surgery.  2. Acute blood loss anemia due to diverticular bleed.  3. History of recent non-ST myocardial infarction, status post cardiac catheterization revealing diffuse right coronary artery disease, not amenable to any surgical treatment, was discharged on Plavix and aspirin, both of these have been discontinued. Patient states that she was off Plavix even prior to hospitalization. She will need to follow up with regular physician if no further bleeding at the time of follow up then can restart aspirin safely.  4. Hypertension.  5. Diet controlled diabetes.  6. Chronic obstructive pulmonary disease.  7. Ongoing smoking.  8. Chronic back pain.  9. Osteoarthritis.  10. History of splenic aneurysm being followed by regular computed tomography scans.  11. Restless leg syndrome.  12. Anemia of chronic disease.   LABORATORY, DIAGNOSTIC AND RADIOLOGICAL DATA: Urinalysis was nitrates negative, leukocytes negative. Hemoglobin and hematocrit were 10.8 and 33.3. WBC 8.8, platelet count 182, lipase 67, glucose 112, BUN 23, creatinine 0.86, sodium 145, potassium 3.6. LFTs were normal. Magnesium 1.6. Troponin less than 0.03. EKG showed normal sinus rhythm with left ventricular hypertrophy. Patient's most recent hemoglobin is 10.0 today. It did drop as low as 7.5. Chest x-ray showed discoid atelectasis, marked elevation of the right hemidiaphragm. No focal areas of consolidation noted. GI bleeding scan positive for active GI bleeding appearing in the descending colon.   CONSULTANTS:  1. Dr. Wyn Quaker.  2. Dr. Marva Panda.   HOSPITAL COURSE: Please see history  and physical done by the admitting physician. Patient is a 81 year old white female with history of multiple medical problems presented to the ED after noticed bright red blood per rectum this morning. She initially was stable but when she got to the room had a large bleed. Patient required transfer to the Critical Care Unit. She also received transfusion. She had a bleeding scan and was also seen by vascular surgery. The bleeding scan was positive therefore underwent a microembolization procedure. Since the microembolization patient has not had any further bleeding. She is doing much better, tolerating diet. She does have some mild left lower quadrant abdominal pain. Her hemoglobin has been stable. At this time she is stable for discharge. Plan was for her to be discharged yesterday but patient didn't want to go.   DISCHARGE MEDICATIONS:  1. Alprazolam 0.5, 1 tab p.o. daily as needed. 2. Percocet 10/325, 1 to 2 tabs every six hours p.r.n.  3. Vitamin D3 1 tab p.o. daily.  4. Vitamin C 1 tab p.o. daily.  5. Vitamin B12 1 tab p.o. daily.  6. Co-Q10 1 capsule daily.  7. Colon health 1 tab p.o. daily.  8. Ramipril 2.5 daily.  9. Metoprolol succinate 25, 1 tab p.o. b.i.d.  10. Imdur 30 daily.  11. Lipitor 40 daily.  12. Mucinex 1 tab p.o. daily.  13. Patient is told not to take aspirin and Plavix until seen by primary M.D.   HOME OXYGEN: None.   DIET: Low sodium, low fat, low cholesterol, low residual diet.   ACTIVITY: As tolerated.   FOLLOW UP: Follow up with primary M.D. in 1 to 2 [place]. Also follow up with Dr. Marva Panda in 1 week.  TIME SPENT: 35 minutes spent.   ____________________________ Lacie Scotts. Allena Katz, MD shp:cms D: 04/21/2011 11:55:49 ET T: 04/21/2011 13:34:40 ET JOB#: [id number]  cc: Jaedyn Lard H. Allena Katz, MD, <Dictator> Charise Carwin MD ELECTRONICALLY SIGNED 05/09/2011 8:15

## 2014-07-25 NOTE — Consult Note (Signed)
Brief Consult Note: Diagnosis: hematochezia.   Patient was seen by consultant.   Recommend to proceed with surgery or procedure.   Recommend further assessment or treatment.   Discussed with Attending MD.   Comments: Patient seen and examined, full consult to follow. Patient admitted yesterday pm with rectal bleeding, brigjht red in nature.  Initial indiaction was possibly diverticular, versus anal outlet.  Hemoglobin on initial check was at previous baseline, and was hemodynamically stable.  Case discussed at that time with Dr Allena Katz.  I requested to have serial hgb drawn, and that if there was further bleeding a bleeding scan would be needed, with vascular surgery consultation.  Overnight she has had multiple repeat episodes, bleeding scan was done and vascular surgery consulted.  At this point, she is being transfused, with 2 units of prbc, and 2 more are available if needed.  Dr Gilda Crease, per discussion with Dr Denzil Magnuson will be proceeding with angio and microembolization as indicated.  Patient has been hemodynamically stable.  Now also on iv ppi.  Following.  Electronic Signatures for Addendum Section:  Barnetta Chapel (MD) (Signed Addendum 16-Jan-13 17:30)  see full GI consult #[id number]   Electronic Signatures: Barnetta Chapel (MD)  (Signed 16-Jan-13 08:38)  Authored: Brief Consult Note   Last Updated: 16-Jan-13 17:30 by Barnetta Chapel (MD)

## 2014-07-25 NOTE — Consult Note (Signed)
Chief Complaint:   Subjective/Chief Complaint patient doing well.  mild lower abdominal pain/discomfort.  no nausea, tolerating full liquids.   VITAL SIGNS/ANCILLARY NOTES: **Vital Signs.:   18-Jan-13 13:00   Vital Signs Type Routine   Temperature Temperature (F) 97.4   Celsius 36.3   Temperature Source axillary   Pulse Pulse 71   Pulse source per Dinamap   Respirations Respirations 20   Systolic BP Systolic BP 947   Diastolic BP (mmHg) Diastolic BP (mmHg) 71   Mean BP 86   BP Source Dinamap   Pulse Ox % Pulse Ox % 97   Pulse Ox Activity Level  At rest   Oxygen Delivery Room Air/ 21 %   Brief Assessment:   Cardiac Regular    Respiratory clear BS    Gastrointestinal details normal Soft  Nondistended  No masses palpable  Bowel sounds normal  mild lower abd discomfort to palpation.   Routine Hem:  16-Jan-13 20:00    Hemoglobin (CBC) 8.9  17-Jan-13 04:00    Hemoglobin (CBC) 8.9    12:07    Hemoglobin (CBC) 7.7    19:55    Hemoglobin (CBC) 8.8  18-Jan-13 03:45    WBC (CBC) 8.8   RBC (CBC) 2.86   Hemoglobin (CBC) 8.5   Hematocrit (CBC) 25.2   Platelet Count (CBC) 105   MCV 88   MCH 29.8   MCHC 33.9   RDW 14.8  Routine Chem:  18-Jan-13 03:45    Glucose, Serum 86   BUN 8   Creatinine (comp) 0.62   Sodium, Serum 146   Potassium, Serum 3.4   Chloride, Serum 108   CO2, Serum 27   Calcium (Total), Serum 7.9   Osmolality (calc) 288   eGFR (African American) >60   eGFR (Non-African American) >60   Anion Gap 11  Routine Hem:  18-Jan-13 03:45    Neutrophil % 71.7   Lymphocyte % 18.7   Monocyte % 7.7   Eosinophil % 1.6   Basophil % 0.3   Neutrophil # 6.3   Lymphocyte # 1.6   Monocyte # 0.7   Eosinophil # 0.1   Basophil # 0.0   Assessment/Plan:  Assessment/Plan:   Assessment 1) rectal bleeding, likely diverticular in nature.  S/P angio/microembolization.  stable.    Plan 1) ciontinue to advance diet to low residue.  continue that for a week before  resuming regular diet.  Will sign off, Dr Vira Agar covering over the weekend if needed.   Electronic Signatures: Loistine Simas (MD)  (Signed 18-Jan-13 14:13)  Authored: Chief Complaint, VITAL SIGNS/ANCILLARY NOTES, Brief Assessment, Lab Results, Assessment/Plan   Last Updated: 18-Jan-13 14:13 by Loistine Simas (MD)

## 2014-08-04 ENCOUNTER — Encounter: Payer: Self-pay | Admitting: Family Medicine

## 2014-08-04 ENCOUNTER — Ambulatory Visit (INDEPENDENT_AMBULATORY_CARE_PROVIDER_SITE_OTHER): Payer: Medicare Other | Admitting: Family Medicine

## 2014-08-04 VITALS — BP 160/73 | HR 61 | Temp 97.8°F | Ht 60.0 in | Wt 123.8 lb

## 2014-08-04 DIAGNOSIS — I2510 Atherosclerotic heart disease of native coronary artery without angina pectoris: Secondary | ICD-10-CM | POA: Diagnosis not present

## 2014-08-04 DIAGNOSIS — N390 Urinary tract infection, site not specified: Secondary | ICD-10-CM

## 2014-08-04 DIAGNOSIS — R319 Hematuria, unspecified: Secondary | ICD-10-CM | POA: Diagnosis not present

## 2014-08-04 MED ORDER — CEPHALEXIN 500 MG PO CAPS: 500.0000 mg | ORAL_CAPSULE | Freq: Two times a day (BID) | ORAL | Status: DC

## 2014-08-04 MED ORDER — CEFTRIAXONE SODIUM 1 G IJ SOLR
1.0000 g | Freq: Once | INTRAMUSCULAR | Status: AC
  Administered 2014-08-04: 1 g via INTRAMUSCULAR

## 2014-08-04 NOTE — Progress Notes (Signed)
Pre visit review using our clinic review tool, if applicable. No additional management support is needed unless otherwise documented below in the visit note. 

## 2014-08-05 ENCOUNTER — Ambulatory Visit (INDEPENDENT_AMBULATORY_CARE_PROVIDER_SITE_OTHER): Payer: Medicare Other | Admitting: Podiatry

## 2014-08-05 ENCOUNTER — Encounter: Payer: Self-pay | Admitting: Podiatry

## 2014-08-05 ENCOUNTER — Ambulatory Visit (INDEPENDENT_AMBULATORY_CARE_PROVIDER_SITE_OTHER): Payer: Medicare Other

## 2014-08-05 VITALS — Ht 60.0 in | Wt 123.0 lb

## 2014-08-05 DIAGNOSIS — M79672 Pain in left foot: Secondary | ICD-10-CM

## 2014-08-05 DIAGNOSIS — M216X9 Other acquired deformities of unspecified foot: Secondary | ICD-10-CM

## 2014-08-05 DIAGNOSIS — M2040 Other hammer toe(s) (acquired), unspecified foot: Secondary | ICD-10-CM

## 2014-08-05 DIAGNOSIS — I2510 Atherosclerotic heart disease of native coronary artery without angina pectoris: Secondary | ICD-10-CM | POA: Diagnosis not present

## 2014-08-05 DIAGNOSIS — L84 Corns and callosities: Secondary | ICD-10-CM | POA: Diagnosis not present

## 2014-08-05 NOTE — Patient Instructions (Signed)
Diabetes and Foot Care Diabetes may cause you to have problems because of poor blood supply (circulation) to your feet and legs. This may cause the skin on your feet to become thinner, break easier, and heal more slowly. Your skin may become dry, and the skin may peel and crack. You may also have nerve damage in your legs and feet causing decreased feeling in them. You may not notice minor injuries to your feet that could lead to infections or more serious problems. Taking care of your feet is one of the most important things you can do for yourself.  HOME CARE INSTRUCTIONS  Wear shoes at all times, even in the house. Do not go barefoot. Bare feet are easily injured.  Check your feet daily for blisters, cuts, and redness. If you cannot see the bottom of your feet, use a mirror or ask someone for help.  Wash your feet with warm water (do not use hot water) and mild soap. Then pat your feet and the areas between your toes until they are completely dry. Do not soak your feet as this can dry your skin.  Apply a moisturizing lotion or petroleum jelly (that does not contain alcohol and is unscented) to the skin on your feet and to dry, brittle toenails. Do not apply lotion between your toes.  Trim your toenails straight across. Do not dig under them or around the cuticle. File the edges of your nails with an emery board or nail file.  Do not cut corns or calluses or try to remove them with medicine.  Wear clean socks or stockings every day. Make sure they are not too tight. Do not wear knee-high stockings since they may decrease blood flow to your legs.  Wear shoes that fit properly and have enough cushioning. To break in new shoes, wear them for just a few hours a day. This prevents you from injuring your feet. Always look in your shoes before you put them on to be sure there are no objects inside.  Do not cross your legs. This may decrease the blood flow to your feet.  If you find a minor scrape,  cut, or break in the skin on your feet, keep it and the skin around it clean and dry. These areas may be cleansed with mild soap and water. Do not cleanse the area with peroxide, alcohol, or iodine.  When you remove an adhesive bandage, be sure not to damage the skin around it.  If you have a wound, look at it several times a day to make sure it is healing.  Do not use heating pads or hot water bottles. They may burn your skin. If you have lost feeling in your feet or legs, you may not know it is happening until it is too late.  Make sure your health care provider performs a complete foot exam at least annually or more often if you have foot problems. Report any cuts, sores, or bruises to your health care provider immediately. SEEK MEDICAL CARE IF:   You have an injury that is not healing.  You have cuts or breaks in the skin.  You have an ingrown nail.  You notice redness on your legs or feet.  You feel burning or tingling in your legs or feet.  You have pain or cramps in your legs and feet.  Your legs or feet are numb.  Your feet always feel cold. SEEK IMMEDIATE MEDICAL CARE IF:   There is increasing redness,   swelling, or pain in or around a wound.  There is a red line that goes up your leg.  Pus is coming from a wound.  You develop a fever or as directed by your health care provider.  You notice a bad smell coming from an ulcer or wound. Document Released: 03/16/2000 Document Revised: 11/19/2012 Document Reviewed: 08/26/2012 ExitCare Patient Information 2015 [employer]. This information is not intended to replace advice given to you by your health care provider. Make sure you discuss any questions you have with your health care provider.  

## 2014-08-05 NOTE — Progress Notes (Signed)
Dr. Karleen Hampshire T. Wesly Whisenant, MD, CAQ Sports Medicine Primary [facility] and Sports Medicine [address] [place], [address] Phone: [phone] Fax: [phone]  08/04/2014  Patient: [name], MRN: [id number], DOB: December 31, 1931, 81 y.o.  Primary Physician:  Hannah Beat, MD  Chief Complaint: Urinary Tract Infection  Subjective:   [name] is a 81 y.o. very pleasant female patient who presents with the following:  The patient is very well-known.  She presents with a known UTI with a positive Escherichia coli UTI with sensitivities.  She was seen at a local urgent care 1 week ago.  Recently she had some diarrhea and then developed a UTI.  She was given some Cipro, but she remains symptomatic.  Past Medical History, Surgical History, Social History, Family History, Problem List, Medications, and Allergies have been reviewed and updated if relevant.  ROS: GEN: Acute illness details above GI: Tolerating PO intake GU: maintaining adequate hydration and urination Pulm: No SOB Interactive and getting along well at home.  Otherwise, ROS is as per the HPI.  Objective:   BP 160/73 mmHg  Pulse 61  Temp(Src) 97.8 F (36.6 C) (Oral)  Ht 5' (1.524 m)  Wt 123 lb 12 oz (56.133 kg)  BMI 24.17 kg/m2   GEN: WDWN, A&Ox4,NAD. Non-toxic HEENT: Atraumatc, normocephalic. CV: RRR, No M/G/R PULM: CTA B, scattered wheezing ABD: S, NT, ND, +BS, no rebound. No CVAT. No suprapubic tenderness. EXT: No c/c/e    Laboratory and Imaging Data:  Assessment and Plan:   Urinary tract infection with hematuria, site unspecified - Plan: cefTRIAXone (ROCEPHIN) injection 1 g  Known UTI, patient requests shot, which is reasonable.  Cont with Keflex x 1 week given cipro failure  Follow-up: No Follow-up on file.  New Prescriptions   CEPHALEXIN (KEFLEX) 500 MG CAPSULE    Take 1 capsule (500 mg total) by mouth 2 (two) times daily.   No orders of the defined types were placed in this  encounter.    Signed,  Elpidio Galea. Gurman Ashland, MD   Patient's Medications  New Prescriptions   CEPHALEXIN (KEFLEX) 500 MG CAPSULE    Take 1 capsule (500 mg total) by mouth 2 (two) times daily.  Previous Medications   ALBUTEROL (PROAIR HFA) 108 (90 BASE) MCG/ACT INHALER    Inhale 2 puffs into the lungs 4 (four) times daily.   ALBUTEROL (PROVENTIL) (5 MG/ML) 0.5% NEBULIZER SOLUTION    Take 0.5 mLs (2.5 mg total) by nebulization every 6 (six) hours as needed for wheezing.   AMLODIPINE (NORVASC) 5 MG TABLET    TAKE ONE TABLET BY MOUTH EVERY DAY   ASPIRIN EC 81 MG TABLET    Take 1 tablet (81 mg total) by mouth daily.   ATORVASTATIN (LIPITOR) 40 MG TABLET    Take 40 mg by mouth every morning.   CYCLOBENZAPRINE (FLEXERIL) 5 MG TABLET    Take 1-2 tablets (5-10 mg total) by mouth at bedtime.   FLUTICASONE-SALMETEROL (ADVAIR DISKUS) 500-50 MCG/DOSE AEPB    Inhale 1 puff into the lungs 2 (two) times daily.   ISOSORBIDE MONONITRATE (IMDUR) 30 MG 24 HR TABLET    Take 30 mg by mouth daily.   MAGNESIUM OXIDE (MAG-OX) 400 MG TABLET    Take 400 mg by mouth at bedtime.    METOPROLOL TARTRATE (LOPRESSOR) 25 MG TABLET    Take 25 mg by mouth 2 (two) times daily.   MORPHINE (KADIAN) 30 MG 24 HR CAPSULE    Take 30 mg  by mouth every 12 (twelve) hours.   MORPHINE (MS CONTIN) 15 MG 12 HR TABLET    Take 15 mg by mouth every 12 (twelve) hours.   NITROGLYCERIN (NITROSTAT) 0.3 MG SL TABLET    Place 1 tablet (0.3 mg total) under the tongue every 5 (five) minutes as needed for chest pain. Max 3 per day   OMEGA-3 FATTY ACIDS (FISH OIL) 1000 MG CAPS    Take 1 capsule by mouth daily.   OXYCODONE (OXYCONTIN) 20 MG T12A 12 HR TABLET    Take 20 mg by mouth every 12 (twelve) hours.   PSYLLIUM ([employer] FIBER) 0.52 G CAPSULE    Take 0.52 g by mouth daily.   RAMIPRIL (ALTACE) 2.5 MG CAPSULE    Take 1 capsule (2.5 mg total) by mouth daily.   TIOTROPIUM (SPIRIVA) 18 MCG INHALATION CAPSULE    Place 1 capsule (18 mcg total) into inhaler and  inhale once.   VITAMIN E PO    Take by mouth.  Modified Medications   No medications on file  Discontinued Medications   POTASSIUM CHLORIDE SA (K-DUR,KLOR-CON) 20 MEQ TABLET    Take 1 tablet (20 mEq total) by mouth daily.

## 2014-08-06 ENCOUNTER — Telehealth: Payer: Self-pay | Admitting: Podiatry

## 2014-08-06 NOTE — Progress Notes (Signed)
Patient ID: [name], female   DOB: 01/13/1932, 81 y.o.   MRN: [id number]  Subjective: 81 year old female presents the office today with complaints of left foot pain with ambulation. She states that she has no pain at rest however which she has prolonged ambulation or weightbearing she does have pain to the foot. She states the majority the pain is in the ball of her foot and the toes. She states it is difficult to find a shoe as comfortable. She states that her shoes are starting to rub on the fifth toes causing irritation. She denies any history of injury or trauma. Denies any swelling or redness around the area. No other complaints at this time.  Objective: AAO 3, NAD DP pulses are palpable bilaterally, PT pulses decreased bilaterally, CRT less than 3 seconds Protective sensation intact with Simms Weinstein monofilament, vibratory sensation intact, Achilles tendon reflex intact. There is prominence of the metatarsal heads plantarly with atrophy of the fat pad. There is significant digital contractures with left greater than right. There is question contractures of the digits and the digits are deviated laterally. HAV is present. She has a wide foot. Currently, there is no specific area pinpoint bony tenderness or pain the vibratory sensation. There is no overlying edema, erythema, increased warmth. MMT 5/5, ROM WNL No open lesions. There are pre-ulcerative lesions to the 5th digits bilateral.  No pain with calf compression, swelling, warmth, erythema.   Assessment: 81 year old female with left foot pain due to digital deformities  Plan: -Treatment options were discussed with the patient the alternatives, risks, complications -X-rays were obtained and reviewed the patient -I do believe the patient would benefit from a diabetic shoe and her extra-depth shoe given her foot type. I completed paperwork for precertification for diabetic shoes. Once this improved the patient will return for  measuring. -Follow-up in the near future sooner if any problems are to arise. Call any questions or concerns or any changes symptoms.

## 2014-08-06 NOTE — Telephone Encounter (Signed)
PT CALLED CHECKING ON THE STATUS OF HER DIABETIC SHOES. PLEASE CALL HER BACK AND LET HER KNOW ON Monday.

## 2014-08-09 ENCOUNTER — Telehealth: Payer: Self-pay | Admitting: Podiatry

## 2014-08-09 ENCOUNTER — Ambulatory Visit: Payer: Medicare Other | Admitting: Primary Care

## 2014-08-09 NOTE — Telephone Encounter (Signed)
Called patient-informed her that Medicare covers 80% of diabetic shoes and insoles. I informed her that once we receive documents back from PCP we will contact her for measuring appointment.

## 2014-08-09 NOTE — Telephone Encounter (Signed)
Pt called asking you guys have checked with insurance about her diabetic shoes to see if they are going to cover them. Pt requested a call back please.

## 2014-08-18 ENCOUNTER — Ambulatory Visit (INDEPENDENT_AMBULATORY_CARE_PROVIDER_SITE_OTHER): Payer: Medicare Other | Admitting: Family Medicine

## 2014-08-18 ENCOUNTER — Encounter: Payer: Self-pay | Admitting: Family Medicine

## 2014-08-18 VITALS — BP 144/78 | HR 105 | Temp 98.3°F | Ht 60.0 in | Wt 121.8 lb

## 2014-08-18 DIAGNOSIS — I2510 Atherosclerotic heart disease of native coronary artery without angina pectoris: Secondary | ICD-10-CM

## 2014-08-18 DIAGNOSIS — E1140 Type 2 diabetes mellitus with diabetic neuropathy, unspecified: Secondary | ICD-10-CM

## 2014-08-18 DIAGNOSIS — E1149 Type 2 diabetes mellitus with other diabetic neurological complication: Secondary | ICD-10-CM

## 2014-08-18 LAB — HEMOGLOBIN A1C: Hgb A1c MFr Bld: 5.4 % (ref 4.6–6.5)

## 2014-08-18 LAB — BASIC METABOLIC PANEL
BUN: 13 mg/dL (ref 6–23)
CALCIUM: 9.4 mg/dL (ref 8.4–10.5)
CHLORIDE: 99 meq/L (ref 96–112)
CO2: 32 mEq/L (ref 19–32)
CREATININE: 0.71 mg/dL (ref 0.40–1.20)
GFR: 84.02 mL/min (ref >60.00)
GLUCOSE: 108 mg/dL — AB (ref 70–99)
Potassium: 3.4 mEq/L — ABNORMAL LOW (ref 3.5–5.1)
Sodium: 138 mEq/L (ref 135–145)

## 2014-08-18 NOTE — Progress Notes (Signed)
Dr. Karleen Hampshire T. Shaiden Aldous, MD, CAQ Sports Medicine Primary [facility] Sports Medicine [address] [place], [address] Phone: [phone] Fax: [phone]  08/18/2014  Patient: [name], MRN: [id number], DOB: September 27, 1931, 81 y.o.  Primary Physician:  Hannah Beat, MD  Chief Complaint: Diabetes  Subjective:   [name] is a 81 y.o. very pleasant female patient who presents with the following:  Diabetes follow-up:    Diabetes Mellitus: Tolerating Medications: diet controlled Compliance with diet: fairly good Exercise: minimal / intermittent Avg blood sugars at home: not checking Foot problems: multiple + neuropathy Hypoglycemia: none No nausea, vomitting, blurred vision, polyuria.  Lab Results  Component Value Date   HGBA1C 5.7* 01/18/2012   HGBA1C 5.9 07/19/2011   HGBA1C 5.3 02/08/2011   Lab Results  Component Value Date   MICROALBUR 0.5 07/19/2011   LDLCALC 74 10/23/2013   CREATININE 0.70 06/28/2014    Wt Readings from Last 3 Encounters:  08/18/14 121 lb 12 oz (55.225 kg)  08/05/14 123 lb (55.792 kg)  08/04/14 123 lb 12 oz (56.133 kg)    Body mass index is 23.78 kg/(m^2).   Past Medical History, Surgical History, Social History, Family History, Problem List, Medications, and Allergies have been reviewed and updated if relevant.   GEN: No acute illnesses, no fevers, chills. GI: No n/v/d, eating normally Pulm: No SOB Interactive and getting along well at home.  Otherwise, ROS is as per the HPI.  Objective:   BP 144/78 mmHg  Pulse 105  Temp(Src) 98.3 F (36.8 C) (Oral)  Ht 5' (1.524 m)  Wt 121 lb 12 oz (55.225 kg)  BMI 23.78 kg/m2   GEN: WDWN, NAD, Non-toxic, Alert & Oriented x 3 HEENT: Atraumatic, Normocephalic.  Ears and Nose: No external deformity. EXTR: No clubbing/cyanosis/edema NEURO: Normal gait. Sensation decreased B feet. PSYCH: Normally interactive. Conversant. Not depressed or anxious appearing.  Calm demeanor.    Laboratory and Imaging Data:  Assessment and Plan:   Diabetes mellitus type 2 with neurological manifestations - Plan: Hemoglobin A1c, Basic metabolic panel  DM has been doing well with diet control. Recheck a1c and BMP  She has extensive changes at the foot bilaterally. Good candidate for diabetic shoes.  Follow-up: No Follow-up on file.  New Prescriptions   No medications on file   Orders Placed This Encounter  Procedures  . Hemoglobin A1c  . Basic metabolic panel    Signed,  Karleen Hampshire T. Brentley Landfair, MD   Patient's Medications  New Prescriptions   No medications on file  Previous Medications   ALBUTEROL (PROAIR HFA) 108 (90 BASE) MCG/ACT INHALER    Inhale 2 puffs into the lungs 4 (four) times daily.   ALBUTEROL (PROVENTIL) (5 MG/ML) 0.5% NEBULIZER SOLUTION    Take 0.5 mLs (2.5 mg total) by nebulization every 6 (six) hours as needed for wheezing.   ASPIRIN EC 81 MG TABLET    Take 1 tablet (81 mg total) by mouth daily.   ATORVASTATIN (LIPITOR) 40 MG TABLET    Take 40 mg by mouth.   DOCUSATE CALCIUM (STOOL SOFTENER PO)    Take 3 tablets by mouth daily.   FLUTICASONE-SALMETEROL (ADVAIR DISKUS) 500-50 MCG/DOSE AEPB    Inhale 1 puff into the lungs 2 (two) times daily.   MELATONIN 5 MG TABS    Take 1 tablet by mouth at bedtime.   METOPROLOL TARTRATE (LOPRESSOR) 25 MG TABLET    Take 25 mg by mouth 2 (two) times daily.   MORPHINE (  KADIAN) 30 MG 24 HR CAPSULE    Take 30 mg by mouth every 12 (twelve) hours.   MORPHINE (MS CONTIN) 15 MG 12 HR TABLET    Take 15 mg by mouth every 12 (twelve) hours.   NITROFURANTOIN (MACRODANTIN) 50 MG CAPSULE    Take 50 mg by mouth.   NITROGLYCERIN (NITROSTAT) 0.3 MG SL TABLET    Place 1 tablet (0.3 mg total) under the tongue every 5 (five) minutes as needed for chest pain. Max 3 per day   OMEGA-3 FATTY ACIDS (FISH OIL) 1000 MG CAPS    Take 1 capsule by mouth daily.   OXYCODONE (OXYCONTIN) 20 MG T12A 12 HR TABLET    Take 20 mg by mouth every 12  (twelve) hours.   PROBIOTIC PRODUCT (PHILLIPS COLON HEALTH PO)    Take 1 capsule by mouth daily.   PSYLLIUM ([employer] FIBER) 0.52 G CAPSULE    Take 0.52 g by mouth daily.   RAMIPRIL (ALTACE) 2.5 MG CAPSULE    Take 1 capsule (2.5 mg total) by mouth daily.   VITAMIN E PO    Take by mouth.  Modified Medications   No medications on file  Discontinued Medications   AMLODIPINE (NORVASC) 5 MG TABLET    TAKE ONE TABLET BY MOUTH EVERY DAY   CEPHALEXIN (KEFLEX) 500 MG CAPSULE    Take 1 capsule (500 mg total) by mouth 2 (two) times daily.   CYCLOBENZAPRINE (FLEXERIL) 5 MG TABLET    Take 1-2 tablets (5-10 mg total) by mouth at bedtime.   ISOSORBIDE MONONITRATE (IMDUR) 30 MG 24 HR TABLET    Take 30 mg by mouth daily.   MAGNESIUM OXIDE (MAG-OX) 400 MG TABLET    Take 400 mg by mouth at bedtime.    TIOTROPIUM (SPIRIVA) 18 MCG INHALATION CAPSULE    Place 1 capsule (18 mcg total) into inhaler and inhale once.

## 2014-08-18 NOTE — Progress Notes (Signed)
Pre visit review using our clinic review tool, if applicable. No additional management support is needed unless otherwise documented below in the visit note. 

## 2014-08-19 ENCOUNTER — Encounter: Payer: Self-pay | Admitting: *Deleted

## 2014-08-31 ENCOUNTER — Ambulatory Visit (INDEPENDENT_AMBULATORY_CARE_PROVIDER_SITE_OTHER)
Admission: RE | Admit: 2014-08-31 | Discharge: 2014-08-31 | Disposition: A | Discharge status: Home / Self Care | Payer: Medicare Other | Source: Ambulatory Visit | Attending: Family Medicine | Admitting: Family Medicine

## 2014-08-31 ENCOUNTER — Ambulatory Visit (INDEPENDENT_AMBULATORY_CARE_PROVIDER_SITE_OTHER): Payer: Medicare Other | Admitting: Family Medicine

## 2014-08-31 ENCOUNTER — Encounter: Payer: Self-pay | Admitting: Family Medicine

## 2014-08-31 VITALS — BP 142/74 | HR 58 | Temp 98.3°F | Wt 126.2 lb

## 2014-08-31 DIAGNOSIS — R600 Localized edema: Secondary | ICD-10-CM | POA: Insufficient documentation

## 2014-08-31 DIAGNOSIS — R0600 Dyspnea, unspecified: Secondary | ICD-10-CM | POA: Diagnosis not present

## 2014-08-31 DIAGNOSIS — I2510 Atherosclerotic heart disease of native coronary artery without angina pectoris: Secondary | ICD-10-CM | POA: Diagnosis not present

## 2014-08-31 LAB — COMPREHENSIVE METABOLIC PANEL
ALK PHOS: 57 U/L (ref 39–117)
ALT: 9 U/L (ref 0–35)
AST: 16 U/L (ref 0–37)
Albumin: 3.7 g/dL (ref 3.5–5.2)
BILIRUBIN TOTAL: 0.4 mg/dL (ref 0.2–1.2)
BUN: 13 mg/dL (ref 6–23)
CO2: 34 mEq/L — ABNORMAL HIGH (ref 19–32)
CREATININE: 0.67 mg/dL (ref 0.40–1.20)
Calcium: 9 mg/dL (ref 8.4–10.5)
Chloride: 101 mEq/L (ref 96–112)
GFR: 89.83 mL/min (ref >60.00)
Glucose, Bld: 78 mg/dL (ref 70–99)
Potassium: 3.3 mEq/L — ABNORMAL LOW (ref 3.5–5.1)
Sodium: 140 mEq/L (ref 135–145)
Total Protein: 7.3 g/dL (ref 6.0–8.3)

## 2014-08-31 LAB — CBC WITH DIFFERENTIAL/PLATELET
BASOS PCT: 0.6 % (ref 0.0–3.0)
Basophils Absolute: 0.1 10*3/uL (ref 0.0–0.1)
EOS PCT: 2.6 % (ref 0.0–5.0)
Eosinophils Absolute: 0.2 10*3/uL (ref 0.0–0.7)
HCT: 36.8 % (ref 36.0–46.0)
Hemoglobin: 11.9 g/dL — ABNORMAL LOW (ref 12.0–15.0)
LYMPHS ABS: 1.9 10*3/uL (ref 0.7–4.0)
LYMPHS PCT: 21.2 % (ref 12.0–46.0)
MCHC: 32.4 g/dL (ref 30.0–36.0)
MCV: 91.5 fl (ref 78.0–100.0)
Monocytes Absolute: 0.7 10*3/uL (ref 0.1–1.0)
Monocytes Relative: 7.9 % (ref 3.0–12.0)
NEUTROS PCT: 67.7 % (ref 43.0–77.0)
Neutro Abs: 6.1 10*3/uL (ref 1.4–7.7)
Platelets: 227 10*3/uL (ref 150.0–400.0)
RBC: 4.02 Mil/uL (ref 3.87–5.11)
RDW: 13.4 % (ref 11.5–15.5)
WBC: 9 10*3/uL (ref 4.0–10.5)

## 2014-08-31 LAB — POCT URINALYSIS DIPSTICK
BILIRUBIN UA: NEGATIVE
GLUCOSE UA: NEGATIVE
Ketones, UA: NEGATIVE
Leukocytes, UA: NEGATIVE
Nitrite, UA: NEGATIVE
PH UA: 6
RBC UA: NEGATIVE
Spec Grav, UA: 1.02
Urobilinogen, UA: 0.2

## 2014-08-31 LAB — TSH: TSH: 1.59 u[IU]/mL (ref 0.35–4.50)

## 2014-08-31 LAB — BRAIN NATRIURETIC PEPTIDE: Pro B Natriuretic peptide (BNP): 144 pg/mL — ABNORMAL HIGH (ref 0.0–100.0)

## 2014-08-31 MED ORDER — FUROSEMIDE 20 MG PO TABS: 10.0000 mg | ORAL_TABLET | Freq: Every day | ORAL | Status: DC

## 2014-08-31 NOTE — Progress Notes (Signed)
Pre visit review using our clinic review tool, if applicable. No additional management support is needed unless otherwise documented below in the visit note. 

## 2014-08-31 NOTE — Addendum Note (Signed)
Addended by: Josph Macho A on: 08/31/2014 02:33 PM   Modules accepted: Orders

## 2014-08-31 NOTE — Assessment & Plan Note (Addendum)
Dyspnea associated with increased cough productive of sputum and new pedal edema. Anticipate diastolic CHF exacerbation Start lasix 10mg  daily for fluid, may increase to 20mg  if ineffective at lower dose. Check CXR today to eval pulm congestion r/p consolidation. Check BNP, CMP, CBC today. Less likely COPD exacerbation related as no fever or significant wheezing and would not expect pedal edema with this. CXR on my read - chronically elevated R hemidiaphragm with hyperinflation.

## 2014-08-31 NOTE — Progress Notes (Addendum)
BP 142/74 mmHg  Pulse 58  Temp(Src) 98.3 F (36.8 C) (Oral)  Wt 126 lb 4 oz (57.267 kg)  SpO2 94%   CC: leg swelling  Subjective:    Patient ID: [name], female    DOB: Jul 01, 1931, 81 y.o.   MRN: [id number]  HPI: [name] is a 81 y.o. female presenting on 08/31/2014 for Edema   Pleasant 82yo pt of Dr Cyndie Chime with history of CAD s/p CABG 2013, diastolic CHF, parox atrial fibrillation, rheumatoid arthritis and osteoarthritis, hypertension, osteoporosis and COPD, chronic back pain and AVM of GI tract presents today with 1d h/o pedal edema + mild dyspnea. She has been compliant with her medications which include lipitor, metoprolol, ramipril, aspirin, advair and several narcotics.  Work up this morning with tight leg swelling. Also notices mild dyspnea over last several Drew[place]. Denies change in diet recently other than increased "fatty foods" because she was worried about her low weight. However she has tried to use low salt salt. Thinks staying well hydrated. Yesterday tried OTC water pill. Has never been on lasix. Chest congestion started several days ago. Increased cough productive of more thick clear phlegm than normal.   She recently restarted oxygen for COPD. Requests Rx renewal for albuterol.   Denies chest pain, tightness, HA, dizziness, wheezing, palpitations. Last afib was prior to CABG.   Relevant past medical, surgical, family and social history reviewed and updated as indicated. Interim medical history since our last visit reviewed. Allergies and medications reviewed and updated. Current Outpatient Prescriptions on File Prior to Visit  Medication Sig  . albuterol (PROAIR HFA) 108 (90 BASE) MCG/ACT inhaler Inhale 2 puffs into the lungs 4 (four) times daily.  [name] albuterol (PROVENTIL) (5 MG/ML) 0.5% nebulizer solution Take 0.5 mLs (2.5 mg total) by nebulization every 6 (six) hours as needed for wheezing.  [name] aspirin EC 81 MG tablet Take 1 tablet (81 mg total) by  mouth daily.  [name] atorvastatin (LIPITOR) 40 MG tablet Take 40 mg by mouth.  Tery Sanfilippo Calcium (STOOL SOFTENER PO) Take 3 tablets by mouth daily.  . Fluticasone-Salmeterol (ADVAIR DISKUS) 500-50 MCG/DOSE AEPB Inhale 1 puff into the lungs 2 (two) times daily.  . Melatonin 5 MG TABS Take 1 tablet by mouth at bedtime.  . metoprolol tartrate (LOPRESSOR) 25 MG tablet Take 25 mg by mouth 2 (two) times daily.  [name] morphine (KADIAN) 30 MG 24 hr capsule Take 30 mg by mouth every 12 (twelve) hours.  [name] morphine (MS CONTIN) 15 MG 12 hr tablet Take 15 mg by mouth every 12 (twelve) hours.  . nitrofurantoin (MACRODANTIN) 50 MG capsule Take 50 mg by mouth.  . nitroGLYCERIN (NITROSTAT) 0.3 MG SL tablet Place 1 tablet (0.3 mg total) under the tongue every 5 (five) minutes as needed for chest pain. Max 3 per day  . Omega-3 Fatty Acids (FISH OIL) 1000 MG CAPS Take 1 capsule by mouth daily.  . OxyCODONE (OXYCONTIN) 20 mg T12A 12 hr tablet Take 20 mg by mouth every 12 (twelve) hours.  . Probiotic Product (PHILLIPS COLON HEALTH PO) Take 1 capsule by mouth daily.  . psyllium ([employer]S FIBER) 0.52 G capsule Take 0.52 g by mouth daily.  . ramipril (ALTACE) 2.5 MG capsule Take 1 capsule (2.5 mg total) by mouth daily.  [name] VITAMIN E PO Take by mouth.   No current facility-administered medications on file prior to visit.   Past Medical History  Diagnosis Date  . Hypertension 08/01/1995  . Rheumatoid  arthritis(714.0) 04/03/1983  . Osteoarthritis 04/03/1983  . Anemia, deficiency 07/01/2004    (w/u by Dr. Lorre Nick)  . Diabetes mellitus type II 07/31/1997  . Osteoporosis 12/31/2000  . CAD (coronary artery disease) 09/01/2002    a. s/p NSTEMI 2012. b. s/p CABG 01/2012.  [name] Hyperlipemia 07/02/1999  . COPD (chronic obstructive pulmonary disease) 05/31/2004  . diverticulosis of colon   . Arteriovenous malformation of gastrointestinal tract   . Fatty liver   . Chronic back pain   . Splenic artery aneurysm   . Anxiety and  depression   . GI bleed     a. 04/2011 felt diverticular requiring microembolization by vascular.  . Adrenal mass     a. Excision 2007.  [name] Restless leg syndrome   . C. difficile colitis     a. 04/2008 = severe.  [name] Hx of CABG 04/18/2012  . Diastolic CHF     a. 02/2012: resp failure with COPD exac/CHF/pleural eff.;  b.  Echocardiogram 08/29/12: EF 55-60%, mild LVH, mild LAE, impaired LV relaxation  . PAF (paroxysmal atrial fibrillation) 12/16/2013  . Status post replacement of both shoulder joints 02/09/2014    Past Surgical History  Procedure Laterality Date  . Nasal sinus surgery    . Inguinal hernia repair    . Partial hysterectomy      ovaries intact, dysmennorhea w/ A/P repari  . Carpal tunnel release      bilat  . Trigger finger release      right  . Wrist surgery      neuroma repair, right  . Ankle surgery      neuroma repair, right ORIF  . Total shoulder replacement  12/13/1998    right, ([facility])  . Rotator cuff repair  11/22/2003    ([facility]) left  . Epidural block injection  05/2005    x 3  . Long finger tendon realignment  10/31/2005    Meyerdierks  . Lumbar epidural  06/13/2006  . Laminectomy  06/2006    L4/5 spinal stenosis (Dr. [name])  . Laminectomy  02/24/2008    L3/4 Ant/Lat interbody fusion and Lat Artthrodesis w/plate using XLIF (Dr. [name])  . Colonic embolization  04/18/2011    Dr. Wyn Quaker, acute GI bleed  . Spinal cord stimulator implant    . Coronary artery bypass graft  01/22/2012    Procedure: CORONARY ARTERY BYPASS GRAFTING (CABG);  Surgeon: Delight Ovens, MD;  Location: [facility] OR;  Service: Open Heart Surgery;  Laterality: N/A;  Coronary Artery Bypass Graft times four utilizing the left internal mammary artery and the left greater saphenous vein harvested endoscopically.  Rhae Hammock without cardioversion  01/22/2012    Procedure: TRANSESOPHAGEAL ECHOCARDIOGRAM (TEE);  Surgeon: Delight Ovens, MD;  Location: [facility] OR;  Service: Open Heart Surgery;  Laterality: N/A;    Review of Systems Per HPI unless specifically indicated above     Objective:    BP 142/74 mmHg  Pulse 58  Temp(Src) 98.3 F (36.8 C) (Oral)  Wt 126 lb 4 oz (57.267 kg)  SpO2 94%  Wt Readings from Last 3 Encounters:  08/31/14 126 lb 4 oz (57.267 kg)  08/18/14 121 lb 12 oz (55.225 kg)  08/05/14 123 lb (55.792 kg)   Body mass index is 24.66 kg/(m^2). Physical Exam  Constitutional: She appears well-developed and well-nourished. No distress.  HENT:  Head: Normocephalic and atraumatic.  Mouth/Throat: Oropharynx is clear and moist. No oropharyngeal exudate.  Eyes: Conjunctivae and EOM are normal. Pupils are equal, round, and  reactive to light. No scleral icterus.  Cardiovascular: Normal rate, regular rhythm, normal heart sounds and intact distal pulses.   No murmur heard. Pulmonary/Chest: Effort normal. No respiratory distress. She has decreased breath sounds. She has wheezes (faint exp wheezing). She has rhonchi (diffusely thorughout). She has no rales.  Abdominal: Soft. Normal appearance and bowel sounds are normal. She exhibits no distension and no mass. There is generalized tenderness (mild ). There is no rebound and no guarding.  Musculoskeletal: She exhibits edema.  LLE 1+ pitting edema below ankles RLE tr pitting edema below ankles BLE nonpitting edema to knees 2+ DP bilaterally  Nursing note and vitals reviewed.  Results for orders placed or performed in visit on 08/18/14  Hemoglobin A1c  Result Value Ref Range   Hgb A1c MFr Bld 5.4 4.6 - 6.5 %  Basic metabolic panel  Result Value Ref Range   Sodium 138 135 - 145 mEq/L   Potassium 3.4 (L) 3.5 - 5.1 mEq/L   Chloride 99 96 - 112 mEq/L   CO2 32 19 - 32 mEq/L   Glucose, Bld 108 (H) 70 - 99 mg/dL   BUN 13 6 - 23 mg/dL   Creatinine, Ser 0.35 0.40 - 1.20 mg/dL   Calcium 9.4 8.4 - 59.7 mg/dL   GFR 41.63 >84.53 mL/min      Assessment & Plan:   Problem List Items Addressed This Visit    Dyspnea    Dyspnea associated  with increased cough productive of sputum and new pedal edema. Anticipate diastolic CHF exacerbation Start lasix 10mg  daily for fluid, may increase to 20mg  if ineffective at lower dose. Check CXR today to eval pulm congestion r/p consolidation. Check BNP, CMP, CBC today. Less likely COPD exacerbation related as no fever or significant wheezing and would not expect pedal edema with this. CXR on my read - chronically elevated R hemidiaphragm with hyperinflation.      Relevant Orders   DG Chest 2 View   Brain natriuretic peptide   CBC with Differential/Platelet   TSH   Comprehensive metabolic panel   Pedal edema - Primary    ?CHF related vs dietary indiscretions with increase in sodium from more fatty meals. Given comorbidities, check CMP, BNP, CBC, TSH, and urinalysis. Discussed limiting dietary sodium. Start lasix 10-20mg  daily for fluid. Not on CCB.      Relevant Orders   Brain natriuretic peptide   CBC with Differential/Platelet   TSH   Comprehensive metabolic panel       Follow up plan: Return if symptoms worsen or fail to improve.

## 2014-08-31 NOTE — Assessment & Plan Note (Addendum)
?  CHF related vs dietary indiscretions with increase in sodium from more fatty meals. Given comorbidities, check CMP, BNP, CBC, TSH, and urinalysis. Discussed limiting dietary sodium. Start lasix 10-20mg  daily for fluid. Not on CCB.

## 2014-08-31 NOTE — Patient Instructions (Signed)
Start lasix 1/2 tablet daily for swelling. If 1/2 tablet doesn't increase urine output, increase to 1 whole tablet daily. labwork today and xray today. We will call you with results. If fever >101, worsening productive cough or shortness of breath, let us know or seek urgent care.

## 2014-09-02 ENCOUNTER — Other Ambulatory Visit: Payer: Self-pay | Admitting: Family Medicine

## 2014-09-02 MED ORDER — POTASSIUM CHLORIDE CRYS ER 20 MEQ PO TBCR: 20.0000 meq | EXTENDED_RELEASE_TABLET | Freq: Every day | ORAL | Status: DC | PRN

## 2014-09-03 ENCOUNTER — Encounter (HOSPITAL_COMMUNITY): Payer: Self-pay | Admitting: Cardiology

## 2014-09-03 ENCOUNTER — Telehealth: Payer: Self-pay | Admitting: Nurse Practitioner

## 2014-09-03 ENCOUNTER — Emergency Department (HOSPITAL_COMMUNITY)
Admission: EM | Admit: 2014-09-03 | Discharge: 2014-09-03 | Disposition: A | Discharge status: Home / Self Care | Payer: Medicare Other | Attending: Emergency Medicine | Admitting: Emergency Medicine

## 2014-09-03 DIAGNOSIS — Z7951 Long term (current) use of inhaled steroids: Secondary | ICD-10-CM | POA: Insufficient documentation

## 2014-09-03 DIAGNOSIS — Z79899 Other long term (current) drug therapy: Secondary | ICD-10-CM | POA: Insufficient documentation

## 2014-09-03 DIAGNOSIS — I10 Essential (primary) hypertension: Secondary | ICD-10-CM | POA: Insufficient documentation

## 2014-09-03 DIAGNOSIS — Z8719 Personal history of other diseases of the digestive system: Secondary | ICD-10-CM | POA: Diagnosis not present

## 2014-09-03 DIAGNOSIS — Z8619 Personal history of other infectious and parasitic diseases: Secondary | ICD-10-CM | POA: Insufficient documentation

## 2014-09-03 DIAGNOSIS — Q2733 Arteriovenous malformation of digestive system vessel: Secondary | ICD-10-CM | POA: Insufficient documentation

## 2014-09-03 DIAGNOSIS — Z7982 Long term (current) use of aspirin: Secondary | ICD-10-CM | POA: Insufficient documentation

## 2014-09-03 DIAGNOSIS — Z951 Presence of aortocoronary bypass graft: Secondary | ICD-10-CM | POA: Diagnosis not present

## 2014-09-03 DIAGNOSIS — G8929 Other chronic pain: Secondary | ICD-10-CM | POA: Diagnosis not present

## 2014-09-03 DIAGNOSIS — R2243 Localized swelling, mass and lump, lower limb, bilateral: Secondary | ICD-10-CM | POA: Insufficient documentation

## 2014-09-03 DIAGNOSIS — J449 Chronic obstructive pulmonary disease, unspecified: Secondary | ICD-10-CM | POA: Insufficient documentation

## 2014-09-03 DIAGNOSIS — M7989 Other specified soft tissue disorders: Secondary | ICD-10-CM

## 2014-09-03 DIAGNOSIS — I5030 Unspecified diastolic (congestive) heart failure: Secondary | ICD-10-CM | POA: Insufficient documentation

## 2014-09-03 DIAGNOSIS — Z880 Allergy status to penicillin: Secondary | ICD-10-CM | POA: Insufficient documentation

## 2014-09-03 DIAGNOSIS — E119 Type 2 diabetes mellitus without complications: Secondary | ICD-10-CM | POA: Diagnosis not present

## 2014-09-03 DIAGNOSIS — M069 Rheumatoid arthritis, unspecified: Secondary | ICD-10-CM | POA: Insufficient documentation

## 2014-09-03 DIAGNOSIS — Z862 Personal history of diseases of the blood and blood-forming organs and certain disorders involving the immune mechanism: Secondary | ICD-10-CM | POA: Insufficient documentation

## 2014-09-03 DIAGNOSIS — E785 Hyperlipidemia, unspecified: Secondary | ICD-10-CM | POA: Diagnosis not present

## 2014-09-03 DIAGNOSIS — Z8659 Personal history of other mental and behavioral disorders: Secondary | ICD-10-CM | POA: Insufficient documentation

## 2014-09-03 DIAGNOSIS — Z720 Tobacco use: Secondary | ICD-10-CM | POA: Insufficient documentation

## 2014-09-03 DIAGNOSIS — I2510 Atherosclerotic heart disease of native coronary artery without angina pectoris: Secondary | ICD-10-CM | POA: Diagnosis not present

## 2014-09-03 MED ORDER — IPRATROPIUM-ALBUTEROL 0.5-2.5 (3) MG/3ML IN SOLN: 3.0000 mL | Freq: Once | RESPIRATORY_TRACT | Status: DC

## 2014-09-03 NOTE — ED Provider Notes (Signed)
CSN: [id number]     Arrival date & time 09/03/14  1138 History   First MD Initiated Contact with Patient 09/03/14 1145     Chief Complaint  Patient presents with  . Leg Swelling   HPI Ms. Brocato is a 81yo woman with PMHx of CAD s/p CABG 2013, diastolic CHF, paroxysmal AFib not on AC, HTN, COPD, and rheumatoid arthritis who presents to the ED with bilateral foot swelling. Patient reports she noticed the foot swelling about 1 week ago. She saw her PCP on 5/31 and was recommended to start Lasix 10-20 mg daily. Patient reports she took Lasix 10 mg daily for the past few days and then took Lasix 20 mg this morning. She notes little improvement in her foot swelling. Denies any trauma to the area. She reports some dyspnea, cough productive of clear mucus, and wheezing. She is a chronic smoker and is currently still smoking. She denies any chest pain, calf pain, and abdominal swelling.    Past Medical History  Diagnosis Date  . Hypertension 08/01/1995  . Rheumatoid arthritis(714.0) 04/03/1983  . Osteoarthritis 04/03/1983  . Anemia, deficiency 07/01/2004    (w/u by Dr. Lorre Nick)  . Diabetes mellitus type II 07/31/1997  . Osteoporosis 12/31/2000  . CAD (coronary artery disease) 09/01/2002    a. s/p NSTEMI 2012. b. s/p CABG 01/2012.  [name] Hyperlipemia 07/02/1999  . COPD (chronic obstructive pulmonary disease) 05/31/2004  . diverticulosis of colon   . Arteriovenous malformation of gastrointestinal tract   . Fatty liver   . Chronic back pain   . Splenic artery aneurysm   . Anxiety and depression   . GI bleed     a. 04/2011 felt diverticular requiring microembolization by vascular.  . Adrenal mass     a. Excision 2007.  [name] Restless leg syndrome   . C. difficile colitis     a. 04/2008 = severe.  [name] Hx of CABG 04/18/2012  . Diastolic CHF     a. 02/2012: resp failure with COPD exac/CHF/pleural eff.;  b.  Echocardiogram 08/29/12: EF 55-60%, mild LVH, mild LAE, impaired LV relaxation  . PAF (paroxysmal atrial  fibrillation) 12/16/2013  . Status post replacement of both shoulder joints 02/09/2014   Past Surgical History  Procedure Laterality Date  . Nasal sinus surgery    . Inguinal hernia repair    . Partial hysterectomy      ovaries intact, dysmennorhea w/ A/P repari  . Carpal tunnel release      bilat  . Trigger finger release      right  . Wrist surgery      neuroma repair, right  . Ankle surgery      neuroma repair, right ORIF  . Total shoulder replacement  12/13/1998    right, ([facility])  . Rotator cuff repair  11/22/2003    ([facility]) left  . Epidural block injection  05/2005    x 3  . Long finger tendon realignment  10/31/2005    [place]ierks  . Lumbar epidural  06/13/2006  . Laminectomy  06/2006    L4/5 spinal stenosis (Dr. [name])  . Laminectomy  02/24/2008    L3/4 Ant/Lat interbody fusion and Lat Artthrodesis w/plate using XLIF (Dr. [name])  . Colonic embolization  04/18/2011    Dr. Wyn Quaker, acute GI bleed  . Spinal cord stimulator implant    . Coronary artery bypass graft  01/22/2012    Procedure: CORONARY ARTERY BYPASS GRAFTING (CABG);  Surgeon: Delight Ovens, MD;  Location: [facility] OR;  Service: Open Heart Surgery;  Laterality: N/A;  Coronary Artery Bypass Graft times four utilizing the left internal mammary artery and the left greater saphenous vein harvested endoscopically.  Rhae Hammock without cardioversion  01/22/2012    Procedure: TRANSESOPHAGEAL ECHOCARDIOGRAM (TEE);  Surgeon: Delight Ovens, MD;  Location: [facility] OR;  Service: Open Heart Surgery;  Laterality: N/A;   Family History  Problem Relation Age of Onset  . Hypotension Mother   . Dementia Mother   . Alcohol abuse Father   . Heart disease Father     MI, enlarged heart  . Diabetes Brother    History  Substance Use Topics  . Smoking status: Former Smoker -- 0.10 packs/day for 50 years    Types: Cigarettes    Quit date: 08/01/2012  . Smokeless tobacco: Former Neurosurgeon  . Alcohol Use: Yes     Comment: red wine occassionally    OB History    No data available     Review of Systems General: Denies fever, chills, night sweats, changes in weight, changes in appetite HEENT: Denies headaches, ear pain, changes in vision, rhinorrhea, sore throat CV: Denies palpitations, orthopnea Pulm: See HPI GI: Denies abdominal pain, nausea, vomiting, diarrhea, constipation, melena, hematochezia GU: Denies dysuria, hematuria, frequency Msk: Denies muscle cramps, joint pains Neuro: Denies weakness, numbness, tingling Skin: Denies rashes, bruising   Allergies  Other; Penicillins; Sulfa antibiotics; Morphine; Morphine sulfate; Macrolides and ketolides; Iron; and Naproxen  Home Medications   Prior to Admission medications   Medication Sig Start Date End Date Taking? Authorizing Provider  albuterol (PROAIR HFA) 108 (90 BASE) MCG/ACT inhaler Inhale 2 puffs into the lungs 4 (four) times daily. 06/17/14   Hannah Beat, MD  albuterol (PROVENTIL) (5 MG/ML) 0.5% nebulizer solution Take 0.5 mLs (2.5 mg total) by nebulization every 6 (six) hours as needed for wheezing. 06/17/14   Hannah Beat, MD  aspirin EC 81 MG tablet Take 1 tablet (81 mg total) by mouth daily. 08/13/13   [name], MD  atorvastatin (LIPITOR) 40 MG tablet Take 40 mg by mouth.    Historical Provider, MD  Docusate Calcium (STOOL SOFTENER PO) Take 3 tablets by mouth daily.    Historical Provider, MD  Fluticasone-Salmeterol (ADVAIR DISKUS) 500-50 MCG/DOSE AEPB Inhale 1 puff into the lungs 2 (two) times daily. 06/17/14   Hannah Beat, MD  furosemide (LASIX) 20 MG tablet Take 0.5-1 tablets (10-20 mg total) by mouth daily. 08/31/14   Eustaquio Boyden, MD  Melatonin 5 MG TABS Take 1 tablet by mouth at bedtime.    Historical Provider, MD  metoprolol tartrate (LOPRESSOR) 25 MG tablet Take 25 mg by mouth 2 (two) times daily.    Historical Provider, MD  morphine (KADIAN) 30 MG 24 hr capsule Take 30 mg by mouth every 12 (twelve) hours.    Historical Provider, MD   morphine (MS CONTIN) 15 MG 12 hr tablet Take 15 mg by mouth every 12 (twelve) hours.    Historical Provider, MD  nitrofurantoin (MACRODANTIN) 50 MG capsule Take 50 mg by mouth. 08/17/14   Historical Provider, MD  nitroGLYCERIN (NITROSTAT) 0.3 MG SL tablet Place 1 tablet (0.3 mg total) under the tongue every 5 (five) minutes as needed for chest pain. Max 3 per day 07/02/12   Hannah Beat, MD  Omega-3 Fatty Acids (FISH OIL) 1000 MG CAPS Take 1 capsule by mouth daily.    Historical Provider, MD  OxyCODONE (OXYCONTIN) 20 mg T12A 12 hr tablet Take 20 mg by mouth every 12 (  twelve) hours.    Historical Provider, MD  potassium chloride SA (K-DUR,KLOR-CON) 20 MEQ tablet Take 1 tablet (20 mEq total) by mouth daily as needed. With lasix 09/02/14   Eustaquio Boyden, MD  Probiotic Product (PHILLIPS COLON HEALTH PO) Take 1 capsule by mouth daily.    Historical Provider, MD  psyllium ([employer]S FIBER) 0.52 G capsule Take 0.52 g by mouth daily.    Historical Provider, MD  ramipril (ALTACE) 2.5 MG capsule Take 1 capsule (2.5 mg total) by mouth daily. 06/24/14   [name], MD  VITAMIN E PO Take by mouth.    Historical Provider, MD   BP 148/60 mmHg  Pulse 65  Temp(Src) 97.7 F (36.5 C) (Oral)  Resp 18  SpO2 93% Physical Exam General: elderly woman sitting up in bed, NAD HEENT: [place]/AT, EOMI, sclera anicteric, mucus membranes moist CV: RRR, no m/g/r  Pulm: diffuse wheezing bilaterally, breaths non-labored Abd: BS+, soft, non-tender, non-distended Ext: 1+ edema in feet bilaterally. No leg swelling or erythema present, legs appear symmetric. No tenderness to palpation of lower extremities.  Neuro: alert and oriented x 3, no focal deficits  ED Course  Procedures (including critical care time) Labs Review Labs Reviewed - No data to display  Imaging Review No results found.   EKG Interpretation None      MDM   Final diagnoses:  Foot swelling    82yo woman presenting with bilateral foot swelling. She  just saw her PCP 3 days ago and blood work was performed. Labs showed a normal TSH, BNP lower than 1 year ago (144), electrolytes normal except for some mild hypokalemia, normal Cr, and trace protein on UA. She does have a history of diastolic HF but no crackles present on exam and her swelling is isolated to her feet. No erythema, asymmetrical swelling, or tenderness on exam to suggest concern for a DVT. Will recommend patient to take Lasix 20 mg daily for next few days and then follow up with PCP early next week to assess swelling. Will give duonebs now to help with wheezing.   Rich Number, MD IMTS PGY-1 Pager: [phone]      Su Hoff, MD 09/03/14 1300  Toy Cookey, MD 09/04/14 [phone]

## 2014-09-03 NOTE — ED Notes (Signed)
Pt is in stable condition upon d/c and ambulates from ED. 

## 2014-09-03 NOTE — Discharge Instructions (Signed)
Peripheral Edema °You have swelling in your legs (peripheral edema). This swelling is due to excess accumulation of salt and water in your body. Edema may be a sign of heart, kidney or liver disease, or a side effect of a medication. It may also be due to problems in the leg veins. Elevating your legs and using special support stockings may be very helpful, if the cause of the swelling is due to poor venous circulation. Avoid long periods of standing, whatever the cause. °Treatment of edema depends on identifying the cause. Chips, pretzels, pickles and other salty foods should be avoided. Restricting salt in your diet is almost always needed. Water pills (diuretics) are often used to remove the excess salt and water from your body via urine. These medicines prevent the kidney from reabsorbing sodium. This increases urine flow. °Diuretic treatment may also result in lowering of potassium levels in your body. Potassium supplements may be needed if you have to use diuretics daily. Daily weights can help you keep track of your progress in clearing your edema. You should call your caregiver for follow up care as recommended. °SEEK IMMEDIATE MEDICAL CARE IF:  °· You have increased swelling, pain, redness, or heat in your legs. °· You develop shortness of breath, especially when lying down. °· You develop chest or abdominal pain, weakness, or fainting. °· You have a fever. °Document Released: 04/26/2004 Document Revised: 06/11/2011 Document Reviewed: 04/06/2009 °ExitCare® Patient Information ©2015 [employer]. This information is not intended to replace advice given to you by your health care provider. Make sure you discuss any questions you have with your health care provider. ° °

## 2014-09-03 NOTE — Telephone Encounter (Signed)
Went to call pt back and noticed that pt had went to the ED for swollen feet. Will close this encounter.

## 2014-09-03 NOTE — Telephone Encounter (Signed)
Left message to call back  

## 2014-09-03 NOTE — Telephone Encounter (Signed)
Both feet have been swollen for a week, was given fluid pill by PCP, some Sob--has appt with Lawson Fiscal 6-7-wants sooner

## 2014-09-03 NOTE — ED Notes (Signed)
Pt reports that she noticed swelling to bilateral feet about a week ago. Reports that they feel tight. Pt went to her PCP and was given 20mg  lasix but states it does not seem to be helping./

## 2014-09-07 ENCOUNTER — Telehealth: Payer: Self-pay | Admitting: *Deleted

## 2014-09-07 ENCOUNTER — Encounter: Payer: Self-pay | Admitting: Nurse Practitioner

## 2014-09-07 ENCOUNTER — Ambulatory Visit: Payer: Medicare Other | Admitting: Nurse Practitioner

## 2014-09-07 ENCOUNTER — Ambulatory Visit (INDEPENDENT_AMBULATORY_CARE_PROVIDER_SITE_OTHER): Payer: Medicare Other | Admitting: Nurse Practitioner

## 2014-09-07 VITALS — BP 150/64 | HR 65 | Ht 60.0 in | Wt 123.1 lb

## 2014-09-07 DIAGNOSIS — I5033 Acute on chronic diastolic (congestive) heart failure: Secondary | ICD-10-CM

## 2014-09-07 DIAGNOSIS — I2510 Atherosclerotic heart disease of native coronary artery without angina pectoris: Secondary | ICD-10-CM

## 2014-09-07 MED ORDER — RAMIPRIL 5 MG PO CAPS: 5.0000 mg | ORAL_CAPSULE | Freq: Every day | ORAL | Status: DC

## 2014-09-07 MED ORDER — NITROGLYCERIN 0.4 MG SL SUBL
0.4000 mg | SUBLINGUAL_TABLET | SUBLINGUAL | Status: DC | PRN
Start: 2014-09-07 — End: 2015-04-20

## 2014-09-07 MED ORDER — NITROGLYCERIN 0.3 MG SL SUBL: 0.3000 mg | SUBLINGUAL_TABLET | SUBLINGUAL | Status: DC | PRN

## 2014-09-07 MED ORDER — FUROSEMIDE 20 MG PO TABS: 10.0000 mg | ORAL_TABLET | Freq: Every day | ORAL | Status: DC | PRN

## 2014-09-07 NOTE — Patient Instructions (Addendum)
We will be checking the following labs today - NONE   Medication Instructions:    Continue with your current medicines but  I am increasing the Ramipril back to 5 mg each day -  I have given you a printed RX  Take the Lasix and potassium only as needed  I have refilled the NTG to your drug store    Testing/Procedures To Be Arranged:  N/A  Follow-Up:   I will see you back in 4 months    Other Special Instructions:   N/A  Call the [facility] office at [phone] if you have any questions, problems or concerns.

## 2014-09-07 NOTE — Telephone Encounter (Signed)
Pt called to add Macrodantin to medication list., already listed.

## 2014-09-07 NOTE — Telephone Encounter (Signed)
[facility] pharmacy called and stated that they do not have the nitro 0.3mg  in stock. They can order it and have it ready for the patient tomorrow or she can switch to the more common dose of 0.4mg . Please advise. Thanks, MI

## 2014-09-07 NOTE — Telephone Encounter (Signed)
Lawson Fiscal advised pt can take 0.4 nitro. S/w Greggory Stallion at [facility] pharmacy was advised of the change

## 2014-09-07 NOTE — Telephone Encounter (Signed)
S/w pt to add on

## 2014-09-07 NOTE — Progress Notes (Signed)
CARDIOLOGY OFFICE NOTE  Date:  09/07/2014    [name] Date of Birth: 05-23-1931 Medical Record #[id number]  PCP:  Hannah Beat, MD  Cardiologist:  Shirlee Latch    Chief Complaint  Patient presents with  . Coronary Artery Disease    4 month check - seen for Dr. Shirlee Latch    History of Present Illness: [name] is a 81 y.o. female who presents today for a 4 month check. Seen for Dr. Shirlee Latch. She has a history of COPD, HTN, diabetes, and CAD. Patient was admitted to [facility] with chest pain in 8/12. Troponin was mildly elevated. Left heart cath showed severe, diffuse disease in a small RCA that was not amenable to intervention. This was the likely culprit vessel. She also had a 70% LAD stenosis. [name] was then done to assess for ischemia in the LAD distribution. This showed only a fixed apical perfusion defect with no ischemia (low risk). She was admitted to [facility] in 1/13 with a lower GI bleed that was thought to be diverticular. She required blood transfusion. Due to ongoing episodes of chest pain, she had LHC in 10/13, and she ended up having CABG x 4 per EBG. She did well post-op and was sent to a nursing home. Readmitted in 11/13 with acute on chronic diastolic CHF and an an acute exacerbation of COPD. She was diuresed and treated with antibiotics. Last echo in 12/2013 at [facility] showed EF 55%.   Had had a lone episode of AF and went to [facility] back in September of 2015 - converted on her own and sounded like it was triggered by bronchitis. Echo updated and showed normal EF with mild LAE. Only on aspirin for her anticoagulation per her wishes.   Last seen by me back in February. She was doing well. Had had a fall. No more AF noted.   Comes back today. Here alone. She has started back smoking - had to put her dog down after having for 16 years. Feet are swelling. She was given some lasix and this has helped. On antibiotics for presumed UTI - not clear as exactly what this for. Her  dose of ACE has been cut back and BP is up. She has used NTG in the past 3 [place] but this correlates to the grief of her dog as well. She says this was more for heart racing and not really for chest pain. This is better now. She says she could not even talk for about 10 days. Asking about nicotine patches - a family member gave her some.   PMH:  1. COPD  2. HTN  3. Diabetes mellitus type II  4. ? AAA: Patient says she was told by [name] that she has an aneurysm. Abdominal US (10/12) showed no AAA.  5. Chronic low back pain  6. Osteoarthritis: hip pain  7. Anemia  8. Excision of adrenal mass in 2007  9. Restless leg syndrome.  10. Bilateral shoulder replacements.  11. CAD: Cath in 2004 with moderate nonobstructive disease. Patient was admitted to [facility] in 8/12 with NSTEMI. LHC (8/12) showed severe diffuse disease of a small RCA. This was the culprit lesion but it was too small and diffusely diseased for intervention. There was a 70% proximal LAD stenosis. Lexiscan myoview (9/12) to assess for LAD territory ischemia showed a small fixed apical defect, low risk. EF 65% by myoview. CABG 10/13 with LIMA-LAD, SVG-D, SVG-PLOM, and SVG-RCA. Echo (11/13): EF 55-60%, grade I  diastolic dysfunction, PA systolic pressure 38 mmHg.  12. Diverticular bleed 1/13 requiring microembolization by vascular surgery. Recurrent diverticular bleeding in 5/14.  13. ABIs (2/13) within normal limits.  14. Chronic diastolic CHF: Last echo (5/14) with EF 55-60%, mild LVH.    Past Medical History  Diagnosis Date  . Hypertension 08/01/1995  . Rheumatoid arthritis(714.0) 04/03/1983  . Osteoarthritis 04/03/1983  . Anemia, deficiency 07/01/2004    (w/u by Dr. Lorre Nick)  . Diabetes mellitus type II 07/31/1997  . Osteoporosis 12/31/2000  . CAD (coronary artery disease) 09/01/2002    a. s/p NSTEMI 2012. b. s/p CABG 01/2012.  [name] Hyperlipemia 07/02/1999  . COPD (chronic obstructive pulmonary disease) 05/31/2004   . diverticulosis of colon   . Arteriovenous malformation of gastrointestinal tract   . Fatty liver   . Chronic back pain   . Splenic artery aneurysm   . Anxiety and depression   . GI bleed     a. 04/2011 felt diverticular requiring microembolization by vascular.  . Adrenal mass     a. Excision 2007.  [name] Restless leg syndrome   . C. difficile colitis     a. 04/2008 = severe.  [name] Hx of CABG 04/18/2012  . Diastolic CHF     a. 02/2012: resp failure with COPD exac/CHF/pleural eff.;  b.  Echocardiogram 08/29/12: EF 55-60%, mild LVH, mild LAE, impaired LV relaxation  . PAF (paroxysmal atrial fibrillation) 12/16/2013  . Status post replacement of both shoulder joints 02/09/2014    Past Surgical History  Procedure Laterality Date  . Nasal sinus surgery    . Inguinal hernia repair    . Partial hysterectomy      ovaries intact, dysmennorhea w/ A/P repari  . Carpal tunnel release      bilat  . Trigger finger release      right  . Wrist surgery      neuroma repair, right  . Ankle surgery      neuroma repair, right ORIF  . Total shoulder replacement  12/13/1998    right, ([place])  . Rotator cuff repair  11/22/2003    ([place]) left  . Epidural block injection  05/2005    x 3  . Long finger tendon realignment  10/31/2005    [place]  . Lumbar epidural  06/13/2006  . Laminectomy  06/2006    L4/5 spinal stenosis (Dr. [name])  . Laminectomy  02/24/2008    L3/4 Ant/Lat interbody fusion and Lat Artthrodesis w/plate using XLIF (Dr. [name])  . Colonic embolization  04/18/2011    Dr. Wyn Quaker, acute GI bleed  . Spinal cord stimulator implant    . Coronary artery bypass graft  01/22/2012    Procedure: CORONARY ARTERY BYPASS GRAFTING (CABG);  Surgeon: Delight Ovens, MD;  Location: [facility] OR;  Service: Open Heart Surgery;  Laterality: N/A;  Coronary Artery Bypass Graft times four utilizing the left internal mammary artery and the left greater saphenous vein harvested endoscopically.  Rhae Hammock without  cardioversion  01/22/2012    Procedure: TRANSESOPHAGEAL ECHOCARDIOGRAM (TEE);  Surgeon: Delight Ovens, MD;  Location: [facility] OR;  Service: Open Heart Surgery;  Laterality: N/A;     Medications: Current Outpatient Prescriptions  Medication Sig Dispense Refill  . albuterol (PROAIR HFA) 108 (90 BASE) MCG/ACT inhaler Inhale 2 puffs into the lungs 4 (four) times daily. 18 g 11  . albuterol (PROVENTIL) (5 MG/ML) 0.5% nebulizer solution Take 0.5 mLs (2.5 mg total) by nebulization every 6 (six) hours as needed for wheezing. 20  mL 12  . aspirin EC 81 MG tablet Take 1 tablet (81 mg total) by mouth daily. 90 tablet 3  . atorvastatin (LIPITOR) 40 MG tablet Take 40 mg by mouth.    Tery Sanfilippo Calcium (STOOL SOFTENER PO) Take 3 tablets by mouth daily.    . Fluticasone-Salmeterol (ADVAIR DISKUS) 500-50 MCG/DOSE AEPB Inhale 1 puff into the lungs 2 (two) times daily. 60 each 11  . furosemide (LASIX) 20 MG tablet Take 0.5-1 tablets (10-20 mg total) by mouth daily as needed. 30 tablet 3  . Melatonin 5 MG TABS Take 1 tablet by mouth at bedtime.    . metoprolol tartrate (LOPRESSOR) 25 MG tablet Take 25 mg by mouth 2 (two) times daily.    [name] morphine (KADIAN) 30 MG 24 hr capsule Take 30 mg by mouth every 12 (twelve) hours.    [name] morphine (MS CONTIN) 15 MG 12 hr tablet Take 15 mg by mouth every 12 (twelve) hours.    . nitrofurantoin (MACRODANTIN) 50 MG capsule Take 50 mg by mouth.    . nitroGLYCERIN (NITROSTAT) 0.3 MG SL tablet Place 1 tablet (0.3 mg total) under the tongue every 5 (five) minutes as needed for chest pain. Max 3 per day 50 tablet 3  . Omega-3 Fatty Acids (FISH OIL) 1000 MG CAPS Take 1 capsule by mouth daily.    . OxyCODONE (OXYCONTIN) 20 mg T12A 12 hr tablet Take 20 mg by mouth every 12 (twelve) hours.    . potassium chloride SA (K-DUR,KLOR-CON) 20 MEQ tablet Take 1 tablet (20 mEq total) by mouth daily as needed. With lasix 30 tablet 3  . Probiotic Product (PHILLIPS COLON HEALTH PO) Take 1 capsule by  mouth daily.    . psyllium ([employer] FIBER) 0.52 G capsule Take 0.52 g by mouth daily.    [name] VITAMIN E PO Take by mouth.    . ramipril (ALTACE) 5 MG capsule Take 1 capsule (5 mg total) by mouth daily. 90 capsule 3   No current facility-administered medications for this visit.    Allergies: Allergies  Allergen Reactions  . Other Diarrhea, Rash and Other (See Comments)    Uncoded Allergy. Allergen: mycins  Reaction: Sore tongue, raw mouth, weakness  . Penicillins Other (See Comments)    Other Reaction: NEAR SYNCOPE  . Sulfa Antibiotics Shortness Of Breath  . Morphine Other (See Comments)    Other Reaction: GI UPSET  . Morphine Sulfate Nausea And Vomiting    At high doses hallucinations Can take in low doses  . Macrolides And Ketolides Diarrhea and Nausea And Vomiting    Only causes GI distress at extremely high doses per patient  . Iron Diarrhea  . Naproxen Rash    Social History: The patient  reports that she has been smoking Cigarettes.  She has a 5 pack-year smoking history. She has quit using smokeless tobacco. She reports that she drinks alcohol. She reports that she does not use illicit drugs.   Family History: The patient's family history includes Alcohol abuse in her father; Dementia in her mother; Diabetes in her brother; Heart disease in her father; Hypotension in her mother.   Review of Systems: Please see the history of present illness.   Otherwise, the review of systems is positive for none.   All other systems are reviewed and negative.   Physical Exam: VS:  BP 150/64 mmHg  Pulse 65  Ht 5' (1.524 m)  Wt 123 lb 1.9 oz (55.847 kg)  BMI 24.05 kg/m2 .  BMI Body mass index is 24.05 kg/(m^2).  Wt Readings from Last 3 Encounters:  09/07/14 123 lb 1.9 oz (55.847 kg)  08/31/14 126 lb 4 oz (57.267 kg)  08/18/14 121 lb 12 oz (55.225 kg)    General: Pleasant. Kyphotic.  Well developed, well nourished and in no acute distress.  HEENT: Normal but with very poor  dentition. Neck: Supple, no JVD, carotid bruits, or masses noted.  Cardiac: Regular rate and rhythm. No murmurs, rubs, or gallops. No edema today.  Respiratory:  Lungs are coarse to auscultation bilaterally with normal work of breathing.  GI: Soft and nontender.  MS: No deformity or atrophy. Gait and ROM intact. Skin: Warm and dry. Color is normal.  Neuro:  Strength and sensation are intact and no gross focal deficits noted.  Psych: Alert, appropriate and with normal affect.   LABORATORY DATA:  EKG:  EKG is ordered today. This demonstrates NSR - prior septal infarct  Lab Results  Component Value Date   WBC 9.0 08/31/2014   HGB 11.9* 08/31/2014   HCT 36.8 08/31/2014   PLT 227.0 08/31/2014   GLUCOSE 78 08/31/2014   CHOL 117 10/23/2013   TRIG 59.0 10/23/2013   HDL 31.30* 10/23/2013   LDLCALC 74 10/23/2013   ALT 9 08/31/2014   AST 16 08/31/2014   NA 140 08/31/2014   K 3.3* 08/31/2014   CL 101 08/31/2014   CREATININE 0.67 08/31/2014   BUN 13 08/31/2014   CO2 34* 08/31/2014   TSH 1.59 08/31/2014   INR 1.0 08/27/2012   HGBA1C 5.4 08/18/2014   MICROALBUR 0.5 07/19/2011    BNP (last 3 results) No results for input(s): BNP in the last 8760 hours.  ProBNP (last 3 results)  Recent Labs  08/31/14 1343  PROBNP 144.0*     Other Studies Reviewed Today:   Assessment/Plan: 1. PAF - hard to say if has recurred - she wishes to only be on aspirin therapy.  2. CAD - No active chest pain/symptoms - Would manage medically.  3. Diastolic HF - compensated at this time - on low dose ACE - increased today  4. Back pain -- followed in the pain clinic.  5. HTN - BP up. She has been under more stress. Taking extra Altace almost every day - will increase back to 5 mg a day. Refuses labs today.   6. HLD - not fasting today. She does not wish to have any labs today.  7. Swelling - improved with lasix - will change to just prn.  8 Situational stress - will take some time for her  to get over  9. Tobacco abuse - she can use the patches - instructed in how to "step down" in size.    Current medicines are reviewed with the patient today.  The patient does not have concerns regarding medicines other than what has been noted above.  The following changes have been made:  See above.  Labs/ tests ordered today include:    Orders Placed This Encounter  Procedures  . EKG 12-Lead     Disposition:   FU with me in 4  months.   Patient is agreeable to this plan and will call if any problems develop in the interim.   Signed: Rosalio Macadamia, RN, ANP-C 09/07/2014 10:49 AM  [facility] [address] [place], [place]  [address] Phone: [phone] Fax: [phone]

## 2014-09-22 ENCOUNTER — Ambulatory Visit (INDEPENDENT_AMBULATORY_CARE_PROVIDER_SITE_OTHER): Payer: Medicare Other | Admitting: Nurse Practitioner

## 2014-09-22 ENCOUNTER — Encounter: Payer: Self-pay | Admitting: Nurse Practitioner

## 2014-09-22 VITALS — BP 150/80 | HR 81 | Ht 60.0 in | Wt 123.0 lb

## 2014-09-22 DIAGNOSIS — R300 Dysuria: Secondary | ICD-10-CM

## 2014-09-22 DIAGNOSIS — I2510 Atherosclerotic heart disease of native coronary artery without angina pectoris: Secondary | ICD-10-CM

## 2014-09-22 DIAGNOSIS — I5032 Chronic diastolic (congestive) heart failure: Secondary | ICD-10-CM

## 2014-09-22 DIAGNOSIS — R609 Edema, unspecified: Secondary | ICD-10-CM | POA: Diagnosis not present

## 2014-09-22 DIAGNOSIS — I480 Paroxysmal atrial fibrillation: Secondary | ICD-10-CM

## 2014-09-22 LAB — BASIC METABOLIC PANEL
BUN: 19 mg/dL (ref 6–23)
CO2: 34 mEq/L — ABNORMAL HIGH (ref 19–32)
Calcium: 9 mg/dL (ref 8.4–10.5)
Chloride: 99 mEq/L (ref 96–112)
Creatinine, Ser: 0.72 mg/dL (ref 0.40–1.20)
GFR: 82.66 mL/min (ref >60.00)
Glucose, Bld: 71 mg/dL (ref 70–99)
Potassium: 3 mEq/L — ABNORMAL LOW (ref 3.5–5.1)
Sodium: 140 mEq/L (ref 135–145)

## 2014-09-22 LAB — URINALYSIS, ROUTINE W REFLEX MICROSCOPIC
Bilirubin Urine: NEGATIVE
Ketones, ur: NEGATIVE
Nitrite: NEGATIVE
Specific Gravity, Urine: 1.025 (ref 1.000–1.030)
Urine Glucose: NEGATIVE
Urobilinogen, UA: 0.2 (ref 0.0–1.0)
pH: 5.5 (ref 5.0–8.0)

## 2014-09-22 LAB — BRAIN NATRIURETIC PEPTIDE: Pro B Natriuretic peptide (BNP): 54 pg/mL (ref 0.0–100.0)

## 2014-09-22 MED ORDER — FUROSEMIDE 20 MG PO TABS: 20.0000 mg | ORAL_TABLET | Freq: Every day | ORAL | Status: DC

## 2014-09-22 MED ORDER — POTASSIUM CHLORIDE CRYS ER 20 MEQ PO TBCR: 20.0000 meq | EXTENDED_RELEASE_TABLET | Freq: Every day | ORAL | Status: DC

## 2014-09-22 NOTE — Addendum Note (Signed)
Addended by: Rosalio Macadamia on: 09/22/2014 02:02 PM   Modules accepted: Orders

## 2014-09-22 NOTE — Patient Instructions (Addendum)
We will be checking the following labs today - BMET, CBC, BNP, UA and urine culture   Medication Instructions:    Continue with your current medicines.   Take the Lasix every day    Testing/Procedures To Be Arranged:  N/A  Follow-Up:   I will see you back as planned    Other Special Instructions:   Get some knee high support stockings - wear every day to help with the swelling  Try to work on your smoking  Call the [facility] office at [phone] if you have any questions, problems or concerns.

## 2014-09-22 NOTE — Progress Notes (Signed)
CARDIOLOGY OFFICE NOTE  Date:  09/22/2014    [name] Date of Birth: 08-02-1931 Medical Record #[id number]  PCP:  Hannah Beat, MD  Cardiologist:  Shirlee Latch    Chief Complaint  Patient presents with  . Edema    Work in visit - seen for Dr. Shirlee Latch    History of Present Illness: [name] is a 81 y.o. female who presents today for a work in visit. Seen for Dr. Shirlee Latch. She has a history of COPD, HTN, diabetes, and CAD. Patient was admitted to [facility] with chest pain in 8/12. Troponin was mildly elevated. Left heart cath showed severe, diffuse disease in a small RCA that was not amenable to intervention. This was the likely culprit vessel. She also had a 70% LAD stenosis. [name] was then done to assess for ischemia in the LAD distribution. This showed only a fixed apical perfusion defect with no ischemia (low risk). She was admitted to [facility] in 1/13 with a lower GI bleed that was thought to be diverticular. She required blood transfusion. Due to ongoing episodes of chest pain, she had LHC in 10/13, and she ended up having CABG x 4 per EBG. She did well post-op and was sent to a nursing home. Readmitted in 11/13 with acute on chronic diastolic CHF and an an acute exacerbation of COPD. She was diuresed and treated with antibiotics. Last echo in 12/2013 at [facility] showed EF 55%.   Had had a lone episode of AF and went to [facility] back in September of 2015 - converted on her own and sounded like it was triggered by bronchitis. Echo updated and showed normal EF with mild LAE. Only on aspirin for her anticoagulation per her wishes.   Seen by me back in February. She was doing well. Had had a fall. No more AF noted.   I saw her earlier this month - back smoking. She had to put her dog down after having for 16 years. Having swelling, heart racing, etc. Lots of somatic issues going on.   Comes back today. Here alone. She says she is here for swelling but was waiting on a phone call from  Dr. Patsy Lager to discuss her swelling???. Lots of issues. Still smoking. Anxious. More swelling. Sounds like she has been eating too much salt - has pork rinds in her car. But she denies excessive use. Taking her lasix about every day. Wanting an antibiotic - because she sat in diarrhea - she is on chronic suppression therapy. No chest pain.   PMH:  1. COPD  2. HTN  3. Diabetes mellitus type II  4. ? AAA: Patient says she was told by [name] that she has an aneurysm. Abdominal US (10/12) showed no AAA.  5. Chronic low back pain  6. Osteoarthritis: hip pain  7. Anemia  8. Excision of adrenal mass in 2007  9. Restless leg syndrome.  10. Bilateral shoulder replacements.  11. CAD: Cath in 2004 with moderate nonobstructive disease. Patient was admitted to [facility] in 8/12 with NSTEMI. LHC (8/12) showed severe diffuse disease of a small RCA. This was the culprit lesion but it was too small and diffusely diseased for intervention. There was a 70% proximal LAD stenosis. Lexiscan myoview (9/12) to assess for LAD territory ischemia showed a small fixed apical defect, low risk. EF 65% by myoview. CABG 10/13 with LIMA-LAD, SVG-D, SVG-PLOM, and SVG-RCA. Echo (11/13): EF 55-60%, grade I diastolic dysfunction, PA systolic pressure 38 mmHg.  12. Diverticular bleed 1/13 requiring microembolization by vascular surgery. Recurrent diverticular bleeding in 5/14.  13. ABIs (2/13) within normal limits.  14. Chronic diastolic CHF: Last echo (5/14) with EF 55-60%, mild LVH.    Past Medical History  Diagnosis Date  . Hypertension 08/01/1995  . Rheumatoid arthritis(714.0) 04/03/1983  . Osteoarthritis 04/03/1983  . Anemia, deficiency 07/01/2004    (w/u by Dr. Lorre Nick)  . Diabetes mellitus type II 07/31/1997  . Osteoporosis 12/31/2000  . CAD (coronary artery disease) 09/01/2002    a. s/p NSTEMI 2012. b. s/p CABG 01/2012.  [name] Hyperlipemia 07/02/1999  . COPD (chronic obstructive pulmonary disease)  05/31/2004  . diverticulosis of colon   . Arteriovenous malformation of gastrointestinal tract   . Fatty liver   . Chronic back pain   . Splenic artery aneurysm   . Anxiety and depression   . GI bleed     a. 04/2011 felt diverticular requiring microembolization by vascular.  . Adrenal mass     a. Excision 2007.  [name] Restless leg syndrome   . C. difficile colitis     a. 04/2008 = severe.  [name] Hx of CABG 04/18/2012  . Diastolic CHF     a. 02/2012: resp failure with COPD exac/CHF/pleural eff.;  b.  Echocardiogram 08/29/12: EF 55-60%, mild LVH, mild LAE, impaired LV relaxation  . PAF (paroxysmal atrial fibrillation) 12/16/2013  . Status post replacement of both shoulder joints 02/09/2014    Past Surgical History  Procedure Laterality Date  . Nasal sinus surgery    . Inguinal hernia repair    . Partial hysterectomy      ovaries intact, dysmennorhea w/ A/P repari  . Carpal tunnel release      bilat  . Trigger finger release      right  . Wrist surgery      neuroma repair, right  . Ankle surgery      neuroma repair, right ORIF  . Total shoulder replacement  12/13/1998    right, ([place])  . Rotator cuff repair  11/22/2003    ([place]) left  . Epidural block injection  05/2005    x 3  . Long finger tendon realignment  10/31/2005    [place]  . Lumbar epidural  06/13/2006  . Laminectomy  06/2006    L4/5 spinal stenosis (Dr. [name])  . Laminectomy  02/24/2008    L3/4 Ant/Lat interbody fusion and Lat Artthrodesis w/plate using XLIF (Dr. [name])  . Colonic embolization  04/18/2011    Dr. Wyn Quaker, acute GI bleed  . Spinal cord stimulator implant    . Coronary artery bypass graft  01/22/2012    Procedure: CORONARY ARTERY BYPASS GRAFTING (CABG);  Surgeon: Delight Ovens, MD;  Location: [facility] OR;  Service: Open Heart Surgery;  Laterality: N/A;  Coronary Artery Bypass Graft times four utilizing the left internal mammary artery and the left greater saphenous vein harvested endoscopically.  Rhae Hammock  without cardioversion  01/22/2012    Procedure: TRANSESOPHAGEAL ECHOCARDIOGRAM (TEE);  Surgeon: Delight Ovens, MD;  Location: [facility] OR;  Service: Open Heart Surgery;  Laterality: N/A;     Medications: Current Outpatient Prescriptions  Medication Sig Dispense Refill  . albuterol (PROAIR HFA) 108 (90 BASE) MCG/ACT inhaler Inhale 2 puffs into the lungs 4 (four) times daily. 18 g 11  . albuterol (PROVENTIL) (5 MG/ML) 0.5% nebulizer solution Take 0.5 mLs (2.5 mg total) by nebulization every 6 (six) hours as needed for wheezing. 20 mL 12  . aspirin EC 81 MG  tablet Take 1 tablet (81 mg total) by mouth daily. 90 tablet 3  . atorvastatin (LIPITOR) 40 MG tablet Take 40 mg by mouth.    Tery Sanfilippo Calcium (STOOL SOFTENER PO) Take 3 tablets by mouth daily.    . Fluticasone-Salmeterol (ADVAIR DISKUS) 500-50 MCG/DOSE AEPB Inhale 1 puff into the lungs 2 (two) times daily. 60 each 11  . furosemide (LASIX) 20 MG tablet Take 0.5-1 tablets (10-20 mg total) by mouth daily as needed. 30 tablet 3  . Melatonin 5 MG TABS Take 1 tablet by mouth at bedtime.    . metoprolol tartrate (LOPRESSOR) 25 MG tablet Take 25 mg by mouth 2 (two) times daily.    [name] morphine (KADIAN) 30 MG 24 hr capsule Take 30 mg by mouth every 12 (twelve) hours.    [name] morphine (MS CONTIN) 15 MG 12 hr tablet Take 15 mg by mouth every 12 (twelve) hours.    . nitrofurantoin (MACRODANTIN) 50 MG capsule Take 50 mg by mouth.    . nitroGLYCERIN (NITROSTAT) 0.4 MG SL tablet Place 1 tablet (0.4 mg total) under the tongue every 5 (five) minutes as needed for chest pain. Max 3 per day 25 tablet 2  . Omega-3 Fatty Acids (FISH OIL) 1000 MG CAPS Take 1 capsule by mouth daily.    . OxyCODONE (OXYCONTIN) 20 mg T12A 12 hr tablet Take 30 mg by mouth every 12 (twelve) hours.     . potassium chloride SA (K-DUR,KLOR-CON) 20 MEQ tablet Take 1 tablet (20 mEq total) by mouth daily as needed. With lasix 30 tablet 3  . Probiotic Product (PHILLIPS COLON HEALTH PO) Take 1  capsule by mouth daily.    . psyllium ([employer] FIBER) 0.52 G capsule Take 0.52 g by mouth daily.    . ramipril (ALTACE) 5 MG capsule Take 1 capsule (5 mg total) by mouth daily. (Patient taking differently: Take 10 mg by mouth daily. ) 90 capsule 3  . VITAMIN E PO Take by mouth.     No current facility-administered medications for this visit.    Allergies: Allergies  Allergen Reactions  . Other Diarrhea, Rash and Other (See Comments)    Uncoded Allergy. Allergen: mycins  Reaction: Sore tongue, raw mouth, weakness  . Penicillins Other (See Comments)    Other Reaction: NEAR SYNCOPE  . Sulfa Antibiotics Shortness Of Breath  . Morphine Other (See Comments)    Other Reaction: GI UPSET  . Morphine Sulfate Nausea And Vomiting    At high doses hallucinations Can take in low doses  . Macrolides And Ketolides Diarrhea and Nausea And Vomiting    Only causes GI distress at extremely high doses per patient  . Iron Diarrhea  . Naproxen Rash    Social History: The patient  reports that she has been smoking Cigarettes.  She has a 5 pack-year smoking history. She has quit using smokeless tobacco. She reports that she drinks alcohol. She reports that she does not use illicit drugs.   Family History: The patient's family history includes Alcohol abuse in her father; Dementia in her mother; Diabetes in her brother; Heart disease in her father; Hypotension in her mother.   Review of Systems: Please see the history of present illness.   Otherwise, the review of systems is positive for none.   All other systems are reviewed and negative.   Physical Exam: VS:  BP 150/80 mmHg  Pulse 81  Ht 5' (1.524 m)  Wt 123 lb (55.792 kg)  BMI 24.02 kg/m2  SpO2 90% .  BMI Body mass index is 24.02 kg/(m^2).  Wt Readings from Last 3 Encounters:  09/22/14 123 lb (55.792 kg)  09/07/14 123 lb 1.9 oz (55.847 kg)  08/31/14 126 lb 4 oz (57.267 kg)    General: Pleasant. Well developed, well nourished and in no acute  distress.  HEENT: Normal. Neck: Supple, no JVD, carotid bruits, or masses noted.  Cardiac: Regular rate and rhythm. No murmurs, rubs, or gallops. No edema.  Respiratory:  Lungs are quite coarse with normal work of breathing.  GI: Soft and nontender.  MS: No deformity or atrophy. Gait and ROM intact. Skin: Warm and dry. Color is normal.  Neuro:  Strength and sensation are intact and no gross focal deficits noted.  Psych: Alert, appropriate and with normal affect.   LABORATORY DATA:  EKG:  EKG is not ordered today.   Lab Results  Component Value Date   WBC 9.0 08/31/2014   HGB 11.9* 08/31/2014   HCT 36.8 08/31/2014   PLT 227.0 08/31/2014   GLUCOSE 78 08/31/2014   CHOL 117 10/23/2013   TRIG 59.0 10/23/2013   HDL 31.30* 10/23/2013   LDLCALC 74 10/23/2013   ALT 9 08/31/2014   AST 16 08/31/2014   NA 140 08/31/2014   K 3.3* 08/31/2014   CL 101 08/31/2014   CREATININE 0.67 08/31/2014   BUN 13 08/31/2014   CO2 34* 08/31/2014   TSH 1.59 08/31/2014   INR 1.0 08/27/2012   HGBA1C 5.4 08/18/2014   MICROALBUR 0.5 07/19/2011    BNP (last 3 results) No results for input(s): BNP in the last 8760 hours.  ProBNP (last 3 results)  Recent Labs  08/31/14 1343  PROBNP 144.0*     Other Studies Reviewed Today:  Last echo from 12/2013 with normal EF.   Assessment/Plan: 1. PAF - hard to say if has recurred - she wishes to only be on aspirin therapy. She is in sinus by exam today and by last EKG earlier this month.  2. CAD - No active chest pain/symptoms - Would manage medically.  3. Diastolic HF - compensated at this time - no signs of swelling and her weight is stable. I will leave her on her current regimen. Ok to take Lasix every day. Recheck lab today. Support stockings encouraged.   4. Back pain -- followed in the pain clinic.  5. HTN - BP up. ACE increased earlier this month. Will have her take her lasix/potassium now every day.  6. HLD - not fasting today.   7.  Swelling - improved with lasix - will continue - encouraged her to use the support stockings on a daily basis.  8 Situational stress - will take some time for her to get over  9. Tobacco abuse - encouraged her to stop.  10. ?UTI - will check urine - explained that I just can't "give" antibiotics for presumed symptoms.  Current medicines are reviewed with the patient today.  The patient does not have concerns regarding medicines other than what has been noted above.  The following changes have been made:  See above.  Labs/ tests ordered today include:    Orders Placed This Encounter  Procedures  . Urine culture  . Basic metabolic panel  . Brain natriuretic peptide  . Urinalysis     Disposition:   FU with me as planned.   Patient is agreeable to this plan and will call if any problems develop in the interim.   Signed: Rosalio Macadamia,  RN, ANP-C 09/22/2014 1:55 PM  [facility] [address] [place], [place]  [address] Phone: [phone] Fax: [phone]

## 2014-09-23 ENCOUNTER — Other Ambulatory Visit: Payer: Self-pay | Admitting: *Deleted

## 2014-09-23 DIAGNOSIS — E876 Hypokalemia: Secondary | ICD-10-CM

## 2014-09-23 DIAGNOSIS — I480 Paroxysmal atrial fibrillation: Secondary | ICD-10-CM

## 2014-09-23 MED ORDER — CIPROFLOXACIN HCL 250 MG PO TABS: 250.0000 mg | ORAL_TABLET | Freq: Two times a day (BID) | ORAL | Status: DC

## 2014-09-23 MED ORDER — POTASSIUM CHLORIDE CRYS ER 20 MEQ PO TBCR: 20.0000 meq | EXTENDED_RELEASE_TABLET | Freq: Two times a day (BID) | ORAL | Status: DC

## 2014-09-24 ENCOUNTER — Telehealth: Payer: Self-pay

## 2014-09-24 LAB — URINE CULTURE
Colony Count: NO GROWTH
Organism ID, Bacteria: NO GROWTH

## 2014-09-24 NOTE — Telephone Encounter (Signed)
Pt left note [facility] needs office visit date within 6 months of getting diabetic shoes. Pt was seen on 08/18/14 for diabetic mgt. Statement for therapeutic shoes was updated with 08/18/14 info and in Dr Copland's in box for signing.

## 2014-09-27 ENCOUNTER — Ambulatory Visit (INDEPENDENT_AMBULATORY_CARE_PROVIDER_SITE_OTHER): Payer: Medicare Other | Admitting: Family Medicine

## 2014-09-27 ENCOUNTER — Telehealth: Payer: Self-pay | Admitting: Family Medicine

## 2014-09-27 ENCOUNTER — Encounter: Payer: Self-pay | Admitting: Family Medicine

## 2014-09-27 VITALS — BP 144/68 | HR 94 | Temp 98.4°F | Wt 124.0 lb

## 2014-09-27 DIAGNOSIS — J441 Chronic obstructive pulmonary disease with (acute) exacerbation: Secondary | ICD-10-CM | POA: Diagnosis not present

## 2014-09-27 DIAGNOSIS — I2510 Atherosclerotic heart disease of native coronary artery without angina pectoris: Secondary | ICD-10-CM

## 2014-09-27 DIAGNOSIS — F17200 Nicotine dependence, unspecified, uncomplicated: Secondary | ICD-10-CM

## 2014-09-27 DIAGNOSIS — J439 Emphysema, unspecified: Secondary | ICD-10-CM | POA: Diagnosis not present

## 2014-09-27 DIAGNOSIS — Z720 Tobacco use: Secondary | ICD-10-CM

## 2014-09-27 MED ORDER — TIOTROPIUM BROMIDE MONOHYDRATE 18 MCG IN CAPS: 18.0000 ug | ORAL_CAPSULE | Freq: Every day | RESPIRATORY_TRACT | Status: DC

## 2014-09-27 MED ORDER — PREDNISONE 20 MG PO TABS: ORAL_TABLET | ORAL | Status: DC

## 2014-09-27 MED ORDER — FLUTICASONE-SALMETEROL 500-50 MCG/DOSE IN AEPB
1.0000 | INHALATION_SPRAY | Freq: Two times a day (BID) | RESPIRATORY_TRACT | Status: DC
Start: 2014-09-27 — End: 2015-09-20

## 2014-09-27 MED ORDER — METHYLPREDNISOLONE ACETATE 80 MG/ML IJ SUSP
80.0000 mg | Freq: Once | INTRAMUSCULAR | Status: AC
  Administered 2014-09-27: 80 mg via INTRAMUSCULAR

## 2014-09-27 NOTE — Telephone Encounter (Signed)
Called pt left message to let her know that she needs to come in and get sized for her diabetic shoes. ( nurse schedule - for diabetic shoe days with Fenton Foy )

## 2014-09-27 NOTE — Progress Notes (Signed)
Pre visit review using our clinic review tool, if applicable. No additional management support is needed unless otherwise documented below in the visit note. 

## 2014-09-27 NOTE — Telephone Encounter (Signed)
Pt has appt with Dr Patsy Lager 09/27/14 at 3:15 PM.

## 2014-09-27 NOTE — Telephone Encounter (Signed)
[facility] Day - Client TELEPHONE ADVICE RECORD [facility]  Patient Name: [name]  DOB: June 22, 1931    Initial Comment caller is very short of breath - can she get a breathing treatment   Nurse Assessment  Nurse: Laural Benes, RN, Dondra Spry Date/Time Lamount Cohen Time): 09/27/2014 8:15:56 AM  Confirm and document reason for call. If symptomatic, describe symptoms. ---Annalucia has been having shortness of breath since last night and nebulizer tx did not help her at all.  Has the patient traveled out of the country within the last 30 days? ---No  Does the patient require triage? ---Yes  Related visit to physician within the last 2 [place]? ---No  Does the PT have any chronic conditions? (i.e. diabetes, asthma, etc.) ---Yes  List chronic conditions. ---COPD     Guidelines    Guideline Title Affirmed Question Affirmed Notes  Breathing Difficulty [1] MILD difficulty breathing (e.g., minimal/no SOB at rest, SOB with walking, pulse <100) AND [2] NEW-onset or WORSE than normal    Final Disposition User   See Physician within 4 Hours (or PCP triage) Laural Benes, RN, Dondra Spry    Comments  09/27/2014 3:15pm Dr. Patsy Lager RE: excerbation of COPD/SOB

## 2014-09-27 NOTE — Progress Notes (Signed)
Dr. Karleen Hampshire T. Evalise Abruzzese, MD, CAQ Sports Medicine [facility] Sports [facility] [address] [place], [address] Phone: [phone] Fax: [phone]  09/27/2014  Patient: [name], MRN: [id number], DOB: [birth year], 80 y.o.  Primary Physician:  Hannah Beat, MD  Chief Complaint: Shortness of Breath  Subjective:   [name] is a 81 y.o. very pleasant female patient who presents with the following:  Having a hard time with her breathing. Will take an extra ramipril - having a sharp pain. Her breathing has been worsening ov multiple times a day as well as her Albuterol metered-dose inhaler.  I had thought that she was also using Advair twice a day, but she was not.  She actually has not been using this with much frequency.  She also self discontinued her Spiriva in the past.  Previously, she has qualified for oxygen, but she did not want to wear this, so she has essentially discontinued this as well.  She is trying to quit smoking right now, but this is somewhat difficult for her.  She is not having any fever, runny nose, or other systemic symptoms.  She is coughing and producing some sputum.  91% 02 Increased resp rate  COPD exacerbation  Past Medical History, Surgical History, Social History, Family History, Problem List, Medications, and Allergies have been reviewed and updated if relevant.  Patient Active Problem List   Diagnosis Date Noted  . History of non-ST elevation myocardial infarction (NSTEMI) 07/22/2011    Priority: High  . GIB (gastrointestinal bleeding) 04/25/2011    Priority: High  . COPD with emphysema 05/31/2004    Priority: High  . CAD (coronary artery disease) 09/01/2002    Priority: High  . Smoking 02/09/2011    Priority: Medium  . AAA (abdominal aortic aneurysm) 12/22/2010    Priority: Medium  . HYPERLIPIDEMIA 07/02/1999    Priority: Medium  . Diabetes mellitus type 2 with neurological manifestations 07/31/1997    Priority:  Medium  . Essential hypertension, benign 08/01/1995    Priority: Medium  . Rheumatoid arthritis(714.0) 04/03/1983    Priority: Medium  . Pedal edema 08/31/2014  . Dyspnea 08/31/2014  . 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 PAF (paroxysmal atrial fibrillation) 12/16/2013  . History of GI diverticular bleed 09/25/2012  . Depression 06/17/2012  . Hx of CABG 04/18/2012  . Acute on chronic diastolic CHF (congestive heart failure) 02/05/2012  . Splenic artery aneurysm   . DIVERTICULOSIS OF COLON 05/14/2008  . ANXIETY DEPRESSION 04/19/2008  . ECZEMA 07/21/2007  . ARTERIOVENOUS MALFORMATION, GASTRIC 11/08/2006  . INSOMNIA, CHRONIC 07/21/2006  . ANEMIA-IRON DEFICIENCY 07/01/2004  . FATTY LIVER DISEASE 07/16/2002  . OSTEOPOROSIS 12/31/2000  . OSTEOARTHRITIS 04/03/1983    Past Medical History  Diagnosis Date  . Hypertension 08/01/1995  . Rheumatoid arthritis(714.0) 04/03/1983  . Osteoarthritis 04/03/1983  . Anemia, deficiency 07/01/2004    (w/u by Dr. Lorre Nick)  . Diabetes mellitus type II 07/31/1997  . Osteoporosis 12/31/2000  . CAD (coronary artery disease) 09/01/2002    a. s/p NSTEMI 2012. b. s/p CABG 01/2012.  [name] Hyperlipemia 07/02/1999  . COPD (chronic obstructive pulmonary disease) 05/31/2004  . diverticulosis of colon   . Arteriovenous malformation of gastrointestinal tract   . Fatty liver   . Chronic back pain   . Splenic artery aneurysm   . Anxiety and depression   . GI bleed     a. 04/2011 felt diverticular requiring microembolization by vascular.  . Adrenal mass  a. Excision 2007.  [name] Restless leg syndrome   . C. difficile colitis     a. 04/2008 = severe.  [name] Hx of CABG 04/18/2012  . Diastolic CHF     a. 02/2012: resp failure with COPD exac/CHF/pleural eff.;  b.  Echocardiogram 08/29/12: EF 55-60%, mild LVH, mild LAE, impaired LV relaxation  . PAF (paroxysmal atrial fibrillation) 12/16/2013  . Status post replacement of both shoulder joints  02/09/2014    Past Surgical History  Procedure Laterality Date  . Nasal sinus surgery    . Inguinal hernia repair    . Partial hysterectomy      ovaries intact, dysmennorhea w/ A/P repari  . Carpal tunnel release      bilat  . Trigger finger release      right  . Wrist surgery      neuroma repair, right  . Ankle surgery      neuroma repair, right ORIF  . Total shoulder replacement  12/13/1998    right, ([place])  . Rotator cuff repair  11/22/2003    ([facility]) left  . Epidural block injection  05/2005    x 3  . Long finger tendon realignment  10/31/2005    [place]  . Lumbar epidural  06/13/2006  . Laminectomy  06/2006    L4/5 spinal stenosis (Dr. [name])  . Laminectomy  02/24/2008    L3/4 Ant/Lat interbody fusion and Lat Artthrodesis w/plate using XLIF (Dr. [name])  . Colonic embolization  04/18/2011    Dr. Wyn Quaker, acute GI bleed  . Spinal cord stimulator implant    . Coronary artery bypass graft  01/22/2012    Procedure: CORONARY ARTERY BYPASS GRAFTING (CABG);  Surgeon: Delight Ovens, MD;  Location: [facility] OR;  Service: Open Heart Surgery;  Laterality: N/A;  Coronary Artery Bypass Graft times four utilizing the left internal mammary artery and the left greater saphenous vein harvested endoscopically.  Rhae Hammock without cardioversion  01/22/2012    Procedure: TRANSESOPHAGEAL ECHOCARDIOGRAM (TEE);  Surgeon: Delight Ovens, MD;  Location: [facility] OR;  Service: Open Heart Surgery;  Laterality: N/A;    History   Social History  . Marital Status: Widowed    Spouse Name: N/A  . Number of Children: 4  . Years of Education: N/A   Occupational History  . [employer]     9 hour days-6 days/week   Social History Main Topics  . Smoking status: Current Every Day Smoker -- 0.10 packs/day for 50 years    Types: Cigarettes    Last Attempt to Quit: 08/01/2012  . Smokeless tobacco: Former Neurosurgeon  . Alcohol Use: 0.0 oz/week    0 Standard drinks or equivalent per week     Comment: red wine occassionally   . Drug Use: No  . Sexual Activity: No   Other Topics Concern  . Not on file   Social History Narrative   Married, widow 89          Family History  Problem Relation Age of Onset  . Hypotension Mother   . Dementia Mother   . Alcohol abuse Father   . Heart disease Father     MI, enlarged heart  . Diabetes Brother     Allergies  Allergen Reactions  . Other Diarrhea, Rash and Other (See Comments)    Uncoded Allergy. Allergen: mycins  Reaction: Sore tongue, raw mouth, weakness  . Penicillins Other (See Comments)    Other Reaction: NEAR SYNCOPE  . Sulfa Antibiotics Shortness Of Breath  .  Morphine Other (See Comments)    Other Reaction: GI UPSET  . Morphine Sulfate Nausea And Vomiting    At high doses hallucinations Can take in low doses  . Macrolides And Ketolides Diarrhea and Nausea And Vomiting    Only causes GI distress at extremely high doses per patient  . Iron Diarrhea  . Naproxen Rash    Medication list reviewed and updated in full in [facility].  ROS: GEN: Acute illness details above GI: Tolerating PO intake GU: maintaining adequate hydration and urination Pulm: + SOB Interactive and getting along well at home.  Otherwise, ROS is as per the HPI.   Objective:   BP 144/68 mmHg  Pulse 94  Temp(Src) 98.4 F (36.9 C) (Oral)  Wt 124 lb (56.246 kg)  SpO2 91% RR 20  GEN: A and O x 3. WDWN. NAD.    ENT: Nose clear, ext NML.  No LAD.  No JVD.  TM's clear. Oropharynx clear.  PULM: mild increase WOB, rhonchorous sounds with wheezing diffusely. No crackles CV: RRR, no M/G/R, No rubs, No JVD.   EXT: warm and well-perfused, No c/c/e. PSYCH: Pleasant and conversant.    Laboratory and Imaging Data:  Assessment and Plan:   COPD exacerbation - Plan: methylPREDNISolone acetate (DEPO-MEDROL) injection 80 mg  Pulmonary emphysema, unspecified emphysema type  Smoking  Seems pure COPD exacerbation Depo-Medrol IM now, then 10 days prednisone  Long  conversation regarding compliance, restart Advair and Spiriva.  Follow-up: No Follow-up on file.  New Prescriptions   PREDNISONE (DELTASONE) 20 MG TABLET    2 tablets po for 5 days, then 1 tab for 5 days   TIOTROPIUM (SPIRIVA) 18 MCG INHALATION CAPSULE    Place 1 capsule (18 mcg total) into inhaler and inhale daily.   No orders of the defined types were placed in this encounter.    Signed,  Elpidio Galea. Sylvester Salonga, MD   Patient's Medications  New Prescriptions   PREDNISONE (DELTASONE) 20 MG TABLET    2 tablets po for 5 days, then 1 tab for 5 days   TIOTROPIUM (SPIRIVA) 18 MCG INHALATION CAPSULE    Place 1 capsule (18 mcg total) into inhaler and inhale daily.  Previous Medications   ALBUTEROL (PROAIR HFA) 108 (90 BASE) MCG/ACT INHALER    Inhale 2 puffs into the lungs 4 (four) times daily.   ALBUTEROL (PROVENTIL) (5 MG/ML) 0.5% NEBULIZER SOLUTION    Take 0.5 mLs (2.5 mg total) by nebulization every 6 (six) hours as needed for wheezing.   ASPIRIN EC 81 MG TABLET    Take 1 tablet (81 mg total) by mouth daily.   ATORVASTATIN (LIPITOR) 40 MG TABLET    Take 40 mg by mouth.   CIPROFLOXACIN (CIPRO) 250 MG TABLET    Take 1 tablet (250 mg total) by mouth 2 (two) times daily.   DOCUSATE CALCIUM (STOOL SOFTENER PO)    Take 3 tablets by mouth daily.   FUROSEMIDE (LASIX) 20 MG TABLET    Take 1 tablet (20 mg total) by mouth daily.   MELATONIN 5 MG TABS    Take 1 tablet by mouth at bedtime.   METOPROLOL TARTRATE (LOPRESSOR) 25 MG TABLET    Take 25 mg by mouth 2 (two) times daily.   MORPHINE (KADIAN) 30 MG 24 HR CAPSULE    Take 30 mg by mouth every 12 (twelve) hours.   MORPHINE (MS CONTIN) 15 MG 12 HR TABLET    Take 15 mg by mouth every 12 (  twelve) hours.   NITROFURANTOIN (MACRODANTIN) 50 MG CAPSULE    Take 50 mg by mouth.   NITROGLYCERIN (NITROSTAT) 0.4 MG SL TABLET    Place 1 tablet (0.4 mg total) under the tongue every 5 (five) minutes as needed for chest pain. Max 3 per day   OMEGA-3 FATTY ACIDS (FISH  OIL) 1000 MG CAPS    Take 1 capsule by mouth daily.   OXYCODONE (OXYCONTIN) 20 MG T12A 12 HR TABLET    Take 30 mg by mouth every 12 (twelve) hours.    POTASSIUM CHLORIDE SA (K-DUR,KLOR-CON) 20 MEQ TABLET    Take 1 tablet (20 mEq total) by mouth 2 (two) times daily. With lasix   PROBIOTIC PRODUCT ([facility] COLON HEALTH PO)    Take 1 capsule by mouth daily.   PSYLLIUM ([employer] FIBER) 0.52 G CAPSULE    Take 0.52 g by mouth daily.   RAMIPRIL (ALTACE) 5 MG CAPSULE    Take 1 capsule (5 mg total) by mouth daily.   VITAMIN E PO    Take by mouth.  Modified Medications   Modified Medication Previous Medication   FLUTICASONE-SALMETEROL (ADVAIR DISKUS) 500-50 MCG/DOSE AEPB Fluticasone-Salmeterol (ADVAIR DISKUS) 500-50 MCG/DOSE AEPB      Inhale 1 puff into the lungs 2 (two) times daily.    Inhale 1 puff into the lungs 2 (two) times daily.  Discontinued Medications   No medications on file

## 2014-09-27 NOTE — Telephone Encounter (Signed)
This was done.

## 2014-09-27 NOTE — Telephone Encounter (Signed)
PLEASE NOTE: All timestamps contained within this report are represented as [place] Standard Time. CONFIDENTIALTY NOTICE: This fax transmission is intended only for the addressee. It contains information that is legally privileged, confidential or otherwise protected from use or disclosure. If you are not the intended recipient, you are strictly prohibited from reviewing, disclosing, copying using or disseminating any of this information or taking any action in reliance on or regarding this information. If you have received this fax in error, please notify us immediately by telephone so that we can arrange for its return to [place]. Phone: [phone], Toll-Free: [phone], Fax: [phone] Page: 1 of 2 Call Id: [id number] [facility] Day - Client TELEPHONE ADVICE RECORD [facility] Patient Name: [name] Gender: Female DOB: Dec 13, 1931 Age: 81 Y 10 M 13 D Return Phone Number: [phone] (Primary) Address: City/State/Zip: [place] Client [facility] [place] [facility] - Client Client Site [facility] [facility] [place] - Day Physician [place], [place] Contact Type Call Call Type Triage / Clinical Relationship To Patient Self Appointment Disposition EMR Appointment Scheduled Info pasted into Epic Yes Return Phone Number [phone] (Primary) Chief Complaint BREATHING - shortness of breath or sounds breathless Initial Comment caller is very short of breath - can she get a breathing treatment PreDisposition Call Doctor Nurse Assessment Nurse: Laural Benes, RN, Dondra Spry Date/Time Lamount Cohen Time): 09/27/2014 8:15:56 AM Confirm and document reason for call. If symptomatic, describe symptoms. ---Kristy has been having shortness of breath since last night and nebulizer tx did not help her at all. Has the patient traveled out of the country within the last 30 days? ---No Does the patient require triage? ---Yes Related visit to physician within  the last 2 [place]? ---No Does the PT have any chronic conditions? (i.e. diabetes, asthma, etc.) ---Yes List chronic conditions. ---COPD Guidelines Guideline Title Affirmed Question Affirmed Notes Nurse Date/Time Lamount Cohen Time) Breathing Difficulty [1] MILD difficulty breathing (e.g., minimal/ no SOB at rest, SOB with walking, pulse <100) AND [2] NEW-onset or WORSE than normal Liberty Handy 09/27/2014 8:19:47 AM Disp. Time Lamount Cohen Time) Disposition Final User 09/27/2014 8:13:26 AM Send to Urgent Queue Lesia Sago 09/27/2014 8:22:15 AM See Physician within 4 Hours (or PCP triage) Yes Laural Benes, RN, Christiane Ha NOTE: All timestamps contained within this report are represented as [place] Standard Time. CONFIDENTIALTY NOTICE: This fax transmission is intended only for the addressee. It contains information that is legally privileged, confidential or otherwise protected from use or disclosure. If you are not the intended recipient, you are strictly prohibited from reviewing, disclosing, copying using or disseminating any of this information or taking any action in reliance on or regarding this information. If you have received this fax in error, please notify us immediately by telephone so that we can arrange for its return to [place]. Phone: [phone], Toll-Free: [phone], Fax: [phone] Page: 2 of 2 Call Id: [id number] Caller Understands: Yes Disagree/Comply: Comply Care Advice Given Per Guideline SEE PHYSICIAN WITHIN 4 HOURS (or PCP triage): * You become worse. CARE ADVICE given per Breathing Difficulty (Adult) guideline. After Care Instructions Given Call Event Type User Date / Time Description Comments User: Fabienne Bruns, RN Date/Time Lamount Cohen Time): 09/27/2014 8:28:56 AM 09/27/2014 3:15pm Dr. Patsy Lager RE: excerbation of COPD/SOB Referrals REFERRED TO PCP OFFICE

## 2014-10-07 ENCOUNTER — Other Ambulatory Visit (INDEPENDENT_AMBULATORY_CARE_PROVIDER_SITE_OTHER): Payer: Medicare Other | Admitting: *Deleted

## 2014-10-07 DIAGNOSIS — I480 Paroxysmal atrial fibrillation: Secondary | ICD-10-CM | POA: Diagnosis not present

## 2014-10-07 DIAGNOSIS — E876 Hypokalemia: Secondary | ICD-10-CM

## 2014-10-07 LAB — BASIC METABOLIC PANEL
BUN: 19 mg/dL (ref 6–23)
CO2: 30 mEq/L (ref 19–32)
Calcium: 9 mg/dL (ref 8.4–10.5)
Chloride: 101 mEq/L (ref 96–112)
Creatinine, Ser: 0.73 mg/dL (ref 0.40–1.20)
GFR: 81.34 mL/min (ref >60.00)
Glucose, Bld: 110 mg/dL — ABNORMAL HIGH (ref 70–99)
Potassium: 3.6 mEq/L (ref 3.5–5.1)
Sodium: 139 mEq/L (ref 135–145)

## 2014-10-19 ENCOUNTER — Ambulatory Visit: Payer: Medicare Other | Admitting: Podiatry

## 2014-10-22 ENCOUNTER — Encounter: Payer: Self-pay | Admitting: *Deleted

## 2014-10-25 ENCOUNTER — Other Ambulatory Visit: Payer: Self-pay | Admitting: Cardiology

## 2014-10-26 ENCOUNTER — Ambulatory Visit (INDEPENDENT_AMBULATORY_CARE_PROVIDER_SITE_OTHER): Payer: Medicare Other | Admitting: Podiatry

## 2014-10-26 DIAGNOSIS — E118 Type 2 diabetes mellitus with unspecified complications: Secondary | ICD-10-CM

## 2014-10-26 NOTE — Progress Notes (Signed)
8 xw custom inserts and [place] 8xw shoe ([id number])

## 2014-10-28 ENCOUNTER — Ambulatory Visit: Payer: Self-pay | Admitting: Podiatry

## 2014-11-29 ENCOUNTER — Encounter: Payer: Self-pay | Admitting: Family Medicine

## 2014-11-29 ENCOUNTER — Ambulatory Visit (INDEPENDENT_AMBULATORY_CARE_PROVIDER_SITE_OTHER): Payer: Medicare Other | Admitting: Family Medicine

## 2014-11-29 VITALS — BP 150/68 | HR 75 | Temp 98.5°F | Ht 60.0 in | Wt 128.2 lb

## 2014-11-29 DIAGNOSIS — G8929 Other chronic pain: Secondary | ICD-10-CM

## 2014-11-29 DIAGNOSIS — I2510 Atherosclerotic heart disease of native coronary artery without angina pectoris: Secondary | ICD-10-CM | POA: Diagnosis not present

## 2014-11-29 DIAGNOSIS — M549 Dorsalgia, unspecified: Secondary | ICD-10-CM | POA: Diagnosis not present

## 2014-11-29 MED ORDER — DEXAMETHASONE SODIUM PHOSPHATE 10 MG/ML IJ SOLN
10.0000 mg | Freq: Once | INTRAMUSCULAR | Status: AC
Start: 2014-11-29 — End: 2014-11-29
  Administered 2014-11-29: 10 mg

## 2014-11-29 MED ORDER — TIZANIDINE HCL 4 MG PO TABS: 2.0000 mg | ORAL_TABLET | Freq: Every evening | ORAL | Status: DC

## 2014-11-29 MED ORDER — PREDNISONE 20 MG PO TABS: ORAL_TABLET | ORAL | Status: DC

## 2014-11-29 NOTE — Progress Notes (Signed)
Pre visit review using our clinic review tool, if applicable. No additional management support is needed unless otherwise documented below in the visit note. 

## 2014-11-29 NOTE — Progress Notes (Signed)
Dr. Karleen Hampshire T. Loranda Mastel, MD, CAQ Sports [facility] [address] [place], [address] Phone: [phone] Fax: [phone]  11/29/2014  Patient: [name], MRN: [id number], DOB: 1931-04-17, 81 y.o.  Primary Physician:  Hannah Beat, MD  Chief Complaint: Back Pain and Hip Pain  Subjective:   [name] is a 81 y.o. very pleasant female patient who presents with the following:  Well-known patient with multiple disease systems a very significant medical comorbidities including cardiovascular and pulmonary, but also with severe chronic back pain on high doses of opioids and extensive arthritis.  She is having a flareup of back pain as well as hip pain and knee pain, and she also is still having ongoing chronic joint pains involving multiple other joints including her hands.  Cannot do exercise due to hip - left buttocks, no groin pain.   L knee pain, too.   decadron  Past Medical History, Surgical History, Social History, Family History, Problem List, Medications, and Allergies have been reviewed and updated if relevant.  Patient Active Problem List   Diagnosis Date Noted  . History of non-ST elevation myocardial infarction (NSTEMI) 07/22/2011    Priority: High  . GIB (gastrointestinal bleeding) 04/25/2011    Priority: High  . COPD with emphysema 05/31/2004    Priority: High  . CAD (coronary artery disease) 09/01/2002    Priority: High  . Smoking 02/09/2011    Priority: Medium  . AAA (abdominal aortic aneurysm) 12/22/2010    Priority: Medium  . HYPERLIPIDEMIA 07/02/1999    Priority: Medium  . Diabetes mellitus type 2 with neurological manifestations 07/31/1997    Priority: Medium  . Essential hypertension, benign 08/01/1995    Priority: Medium  . Rheumatoid arthritis(714.0) 04/03/1983    Priority: Medium  . Pedal edema 08/31/2014  . Dyspnea 08/31/2014  . 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 PAF  (paroxysmal atrial fibrillation) 12/16/2013  . History of GI diverticular bleed 09/25/2012  . Depression 06/17/2012  . Hx of CABG 04/18/2012  . Acute on chronic diastolic CHF (congestive heart failure) 02/05/2012  . Splenic artery aneurysm   . DIVERTICULOSIS OF COLON 05/14/2008  . ANXIETY DEPRESSION 04/19/2008  . ECZEMA 07/21/2007  . ARTERIOVENOUS MALFORMATION, GASTRIC 11/08/2006  . INSOMNIA, CHRONIC 07/21/2006  . ANEMIA-IRON DEFICIENCY 07/01/2004  . FATTY LIVER DISEASE 07/16/2002  . OSTEOPOROSIS 12/31/2000  . OSTEOARTHRITIS 04/03/1983    Past Medical History  Diagnosis Date  . Hypertension 08/01/1995  . Rheumatoid arthritis(714.0) 04/03/1983  . Osteoarthritis 04/03/1983  . Anemia, deficiency 07/01/2004    (w/u by Dr. Lorre Nick)  . Diabetes mellitus type II 07/31/1997  . Osteoporosis 12/31/2000  . CAD (coronary artery disease) 09/01/2002    a. s/p NSTEMI 2012. b. s/p CABG 01/2012.  [name] Hyperlipemia 07/02/1999  . COPD (chronic obstructive pulmonary disease) 05/31/2004  . diverticulosis of colon   . Arteriovenous malformation of gastrointestinal tract   . Fatty liver   . Chronic back pain   . Splenic artery aneurysm   . Anxiety and depression   . GI bleed     a. 04/2011 felt diverticular requiring microembolization by vascular.  . Adrenal mass     a. Excision 2007.  [name] Restless leg syndrome   . C. difficile colitis     a. 04/2008 = severe.  [name] Hx of CABG 04/18/2012  . Diastolic CHF     a. 02/2012: resp failure with COPD exac/CHF/pleural eff.;  b.  Echocardiogram 08/29/12:  EF 55-60%, mild LVH, mild LAE, impaired LV relaxation  . PAF (paroxysmal atrial fibrillation) 12/16/2013  . Status post replacement of both shoulder joints 02/09/2014    Past Surgical History  Procedure Laterality Date  . Nasal sinus surgery    . Inguinal hernia repair    . Partial hysterectomy      ovaries intact, dysmennorhea w/ A/P repari  . Carpal tunnel release      bilat  . Trigger finger release        right  . Wrist surgery      neuroma repair, right  . Ankle surgery      neuroma repair, right ORIF  . Total shoulder replacement  12/13/1998    right, ([facility])  . Rotator cuff repair  11/22/2003    ([facility]) left  . Epidural block injection  05/2005    x 3  . Long finger tendon realignment  10/31/2005    [place]  . Lumbar epidural  06/13/2006  . Laminectomy  06/2006    L4/5 spinal stenosis (Dr. [name])  . Laminectomy  02/24/2008    L3/4 Ant/Lat interbody fusion and Lat Artthrodesis w/plate using XLIF (Dr. [name])  . Colonic embolization  04/18/2011    Dr. Wyn Quaker, acute GI bleed  . Spinal cord stimulator implant    . Coronary artery bypass graft  01/22/2012    Procedure: CORONARY ARTERY BYPASS GRAFTING (CABG);  Surgeon: Delight Ovens, MD;  Location[facility] OR;  Service: Open Heart Surgery;  Laterality: N/A;  Coronary Artery Bypass Graft times four utilizing the left internal mammary artery and the left greater saphenous vein harvested endoscopically.  Rhae Hammock without cardioversion  01/22/2012    Procedure: TRANSESOPHAGEAL ECHOCARDIOGRAM (TEE);  Surgeon: Delight Ovens, MD;  Location: [facility] OR;  Service: Open Heart Surgery;  Laterality: N/A;    Social History   Social History  . Marital Status: Widowed    Spouse Name: N/A  . Number of Children: 4  . Years of Education: N/A   Occupational History  . [employer]     9 hour days-6 days/week   Social History Main Topics  . Smoking status: Current Every Day Smoker -- 0.10 packs/day for 50 years    Types: Cigarettes    Last Attempt to Quit: 08/01/2012  . Smokeless tobacco: Former Neurosurgeon  . Alcohol Use: 0.0 oz/week    0 Standard drinks or equivalent per week     Comment: red wine occassionally  . Drug Use: No  . Sexual Activity: No   Other Topics Concern  . Not on file   Social History Narrative   Married, widow 61          Family History  Problem Relation Age of Onset  . Hypotension Mother   . Dementia Mother   . Alcohol  abuse Father   . Heart disease Father     MI, enlarged heart  . Diabetes Brother     Allergies  Allergen Reactions  . Other Diarrhea, Rash and Other (See Comments)    Uncoded Allergy. Allergen: mycins  Reaction: Sore tongue, raw mouth, weakness  . Penicillins Other (See Comments)    Other Reaction: NEAR SYNCOPE  . Sulfa Antibiotics Shortness Of Breath  . Morphine Other (See Comments)    Other Reaction: GI UPSET  . Morphine Sulfate Nausea And Vomiting    At high doses hallucinations Can take in low doses  . Macrolides And Ketolides Diarrhea and Nausea And Vomiting    Only causes GI  distress at extremely high doses per patient  . Iron Diarrhea  . Naproxen Rash    Medication list reviewed and updated in full in [facility].  GEN: No fevers, chills. Nontoxic. Primarily MSK c/o today. MSK: Detailed in the HPI GI: tolerating PO intake without difficulty Neuro: No numbness, parasthesias, or tingling associated. Otherwise the pertinent positives of the ROS are noted above.   Objective:   BP 150/68 mmHg  Pulse 75  Temp(Src) 98.5 F (36.9 C) (Oral)  Ht 5' (1.524 m)  Wt 128 lb 4 oz (58.174 kg)  BMI 25.05 kg/m2   GEN: WDWN, NAD, Non-toxic, Alert & Oriented x 3 HEENT: Atraumatic, Normocephalic.  Ears and Nose: No external deformity. EXTR: No clubbing/cyanosis/edema NEURO: Normal gait.  PSYCH: Normally interactive. Conversant. Not depressed or anxious appearing.  Calm demeanor.    Back exam is fairly limited.  Tender to palpation from L2-S1 diffusely.  She also has pain in her posterior buttocks region.  Hip range of motion is mostly preserved in all directions with only mild pain and terminal motion. Currently, negative straight leg raise.  Radiology: No results found.  Assessment and Plan:   Back pain, unspecified location - Plan: dexamethasone (DECADRON) injection 10 mg  Chronic back pain greater than 3 months duration  Very challenging case.  For now we  will give her 10 mg of Decadron.  She has some financial limitations currently, and when she is able she will fill her muscle relaxant, and I think a course of some oral steroids might be of some benefit.  She understands that none of these things are going to be curative in nature.  New Prescriptions   PREDNISONE (DELTASONE) 20 MG TABLET    2 tabs po for 4 days, then 1 tab po for 4 days   TIZANIDINE (ZANAFLEX) 4 MG TABLET    Take 0.5-1 tablets (2-4 mg total) by mouth Nightly.   No orders of the defined types were placed in this encounter.    Signed,  Elpidio Galea. Amaya Blakeman, MD   Patient's Medications  New Prescriptions   PREDNISONE (DELTASONE) 20 MG TABLET    2 tabs po for 4 days, then 1 tab po for 4 days   TIZANIDINE (ZANAFLEX) 4 MG TABLET    Take 0.5-1 tablets (2-4 mg total) by mouth Nightly.  Previous Medications   ALBUTEROL (PROAIR HFA) 108 (90 BASE) MCG/ACT INHALER    Inhale 2 puffs into the lungs 4 (four) times daily.   ALBUTEROL (PROVENTIL) (5 MG/ML) 0.5% NEBULIZER SOLUTION    Take 0.5 mLs (2.5 mg total) by nebulization every 6 (six) hours as needed for wheezing.   ASPIRIN EC 81 MG TABLET    Take 1 tablet (81 mg total) by mouth daily.   ATORVASTATIN (LIPITOR) 40 MG TABLET    TAKE ONE TABLET BY MOUTH EVERY DAY   DOCUSATE CALCIUM (STOOL SOFTENER PO)    Take 3 tablets by mouth daily.   FLUTICASONE-SALMETEROL (ADVAIR DISKUS) 500-50 MCG/DOSE AEPB    Inhale 1 puff into the lungs 2 (two) times daily.   FUROSEMIDE (LASIX) 20 MG TABLET    Take 1 tablet (20 mg total) by mouth daily.   MELATONIN 5 MG TABS    Take 1 tablet by mouth at bedtime.   METOPROLOL TARTRATE (LOPRESSOR) 25 MG TABLET    Take 25 mg by mouth 2 (two) times daily.   MORPHINE (KADIAN) 30 MG 24 HR CAPSULE    Take 30 mg by mouth every  12 (twelve) hours.   MORPHINE (MS CONTIN) 15 MG 12 HR TABLET    Take 15 mg by mouth every 12 (twelve) hours.   NITROGLYCERIN (NITROSTAT) 0.4 MG SL TABLET    Place 1 tablet (0.4 mg total) under  the tongue every 5 (five) minutes as needed for chest pain. Max 3 per day   OMEGA-3 FATTY ACIDS (FISH OIL) 1000 MG CAPS    Take 1 capsule by mouth daily.   OXYCODONE (OXYCONTIN) 20 MG T12A 12 HR TABLET    Take 30 mg by mouth every 12 (twelve) hours.    POTASSIUM CHLORIDE SA (K-DUR,KLOR-CON) 20 MEQ TABLET    Take 1 tablet (20 mEq total) by mouth 2 (two) times daily. With lasix   PROBIOTIC PRODUCT ([facility] COLON HEALTH PO)    Take 1 capsule by mouth daily.   PSYLLIUM ([employer] FIBER) 0.52 G CAPSULE    Take 0.52 g by mouth daily.   RAMIPRIL (ALTACE) 5 MG CAPSULE    Take 1 capsule (5 mg total) by mouth daily.   TIOTROPIUM (SPIRIVA) 18 MCG INHALATION CAPSULE    Place 1 capsule (18 mcg total) into inhaler and inhale daily.   VITAMIN E PO    Take by mouth.  Modified Medications   No medications on file  Discontinued Medications   CIPROFLOXACIN (CIPRO) 250 MG TABLET    Take 1 tablet (250 mg total) by mouth 2 (two) times daily.   METOPROLOL TARTRATE (LOPRESSOR) 25 MG TABLET    TAKE ONE TABLET BY MOUTH TWICE DAILY   NITROFURANTOIN (MACRODANTIN) 50 MG CAPSULE    Take 50 mg by mouth.   PREDNISONE (DELTASONE) 20 MG TABLET    2 tablets po for 5 days, then 1 tab for 5 days

## 2014-11-30 ENCOUNTER — Encounter: Payer: Self-pay | Admitting: Family Medicine

## 2014-11-30 DIAGNOSIS — M961 Postlaminectomy syndrome, not elsewhere classified: Secondary | ICD-10-CM | POA: Insufficient documentation

## 2014-11-30 DIAGNOSIS — G8929 Other chronic pain: Secondary | ICD-10-CM

## 2014-11-30 DIAGNOSIS — M549 Dorsalgia, unspecified: Secondary | ICD-10-CM

## 2014-11-30 HISTORY — DX: Other chronic pain: G89.29

## 2014-11-30 HISTORY — DX: Dorsalgia, unspecified: M54.9

## 2014-12-09 ENCOUNTER — Encounter: Payer: Self-pay | Admitting: Podiatry

## 2014-12-09 ENCOUNTER — Ambulatory Visit (INDEPENDENT_AMBULATORY_CARE_PROVIDER_SITE_OTHER): Payer: Medicare Other | Admitting: Podiatry

## 2014-12-09 DIAGNOSIS — E1140 Type 2 diabetes mellitus with diabetic neuropathy, unspecified: Secondary | ICD-10-CM | POA: Diagnosis not present

## 2014-12-09 DIAGNOSIS — M2041 Other hammer toe(s) (acquired), right foot: Secondary | ICD-10-CM | POA: Diagnosis not present

## 2014-12-09 DIAGNOSIS — M2042 Other hammer toe(s) (acquired), left foot: Secondary | ICD-10-CM | POA: Diagnosis not present

## 2014-12-09 DIAGNOSIS — E118 Type 2 diabetes mellitus with unspecified complications: Secondary | ICD-10-CM

## 2014-12-09 DIAGNOSIS — L84 Corns and callosities: Secondary | ICD-10-CM | POA: Diagnosis not present

## 2014-12-09 DIAGNOSIS — M216X9 Other acquired deformities of unspecified foot: Secondary | ICD-10-CM

## 2014-12-09 DIAGNOSIS — E1149 Type 2 diabetes mellitus with other diabetic neurological complication: Secondary | ICD-10-CM

## 2014-12-09 DIAGNOSIS — M2040 Other hammer toe(s) (acquired), unspecified foot: Secondary | ICD-10-CM

## 2014-12-09 NOTE — Progress Notes (Addendum)
Patient ID: [name], female   DOB: 1931/04/24, 81 y.o.   MRN: [id number]   [name] presents today to pick up diabetic shoes, and was seen by Fenton Foy, certified pedorthist. She was fitted for her shoes and they appear to be fitting well without any complications or high pressure areas. Break-in instructions were given. She verbalized understanding. She shoes were not actually dispensed today due to incomplete paperwork. Once completed, she can come by and pick up her shoes. Follow-up for routine care. No new complaints today.   Addendum: Paperwork was completed and the patient came by and picked up the shoes/inserts.

## 2014-12-22 ENCOUNTER — Ambulatory Visit: Payer: Medicare Other | Admitting: Family Medicine

## 2014-12-23 ENCOUNTER — Telehealth: Payer: Self-pay | Admitting: Family Medicine

## 2014-12-23 NOTE — Telephone Encounter (Signed)
Pt did not come in for their appt on 12/22/14 for a follow up. Please let me know if pt needs to be contacted immediately for follow up or no follow up needed. Best phone number to contact pt is [phone].

## 2014-12-23 NOTE — Telephone Encounter (Signed)
i saw her less than 1 month ago ok to f/u if needed

## 2014-12-23 NOTE — Telephone Encounter (Signed)
Left a message for pt to call back and reschedule appt if needed.

## 2015-01-10 ENCOUNTER — Encounter: Payer: Self-pay | Admitting: Internal Medicine

## 2015-01-10 ENCOUNTER — Ambulatory Visit (INDEPENDENT_AMBULATORY_CARE_PROVIDER_SITE_OTHER): Payer: Medicare Other | Admitting: Internal Medicine

## 2015-01-10 VITALS — BP 146/68 | HR 63 | Temp 98.1°F | Wt 130.0 lb

## 2015-01-10 DIAGNOSIS — R300 Dysuria: Secondary | ICD-10-CM | POA: Diagnosis not present

## 2015-01-10 DIAGNOSIS — N390 Urinary tract infection, site not specified: Secondary | ICD-10-CM

## 2015-01-10 DIAGNOSIS — I2510 Atherosclerotic heart disease of native coronary artery without angina pectoris: Secondary | ICD-10-CM | POA: Diagnosis not present

## 2015-01-10 LAB — POCT URINALYSIS DIPSTICK
Bilirubin, UA: NEGATIVE
GLUCOSE UA: NEGATIVE
Ketones, UA: NEGATIVE
NITRITE UA: NEGATIVE
PH UA: 6
RBC UA: NEGATIVE
Spec Grav, UA: >=1.03
UROBILINOGEN UA: NEGATIVE

## 2015-01-10 MED ORDER — CEPHALEXIN 250 MG PO CAPS: 250.0000 mg | ORAL_CAPSULE | Freq: Three times a day (TID) | ORAL | Status: DC

## 2015-01-10 NOTE — Addendum Note (Signed)
Addended by: Roena Malady on: 01/10/2015 01:41 PM   Modules accepted: Orders

## 2015-01-10 NOTE — Progress Notes (Signed)
HPI  Pt presents to the clinic today with c/o urinary urgency, frequency and dysuira. This started about 1 week ago. She denies fever, chills, nausea or low back pain. She has tried pushing fluids and taken AZO with no relief. She did have diarrhea for 2 days just prior to the onset of her urinary symptoms. She has had UTI's in the past and reports this feels the same.   Review of Systems  Past Medical History  Diagnosis Date  . Hypertension 08/01/1995  . Rheumatoid arthritis(714.0) 04/03/1983  . Osteoarthritis 04/03/1983  . Anemia, deficiency 07/01/2004    (w/u by Dr. Lorre Nick)  . Diabetes mellitus type II 07/31/1997  . Osteoporosis 12/31/2000  . CAD (coronary artery disease) 09/01/2002    a. s/p NSTEMI 2012. b. s/p CABG 01/2012.  [name] Hyperlipemia 07/02/1999  . COPD (chronic obstructive pulmonary disease) (HCC) 05/31/2004  . diverticulosis of colon   . Arteriovenous malformation of gastrointestinal tract   . Fatty liver   . Chronic back pain   . Splenic artery aneurysm (HCC)   . Anxiety and depression   . GI bleed     a. 04/2011 felt diverticular requiring microembolization by vascular.  . Adrenal mass (HCC)     a. Excision 2007.  [name] Restless leg syndrome   . C. difficile colitis     a. 04/2008 = severe.  [name] Hx of CABG 04/18/2012  . Diastolic CHF (HCC)     a. 02/2012: resp failure with COPD exac/CHF/pleural eff.;  b.  Echocardiogram 08/29/12: EF 55-60%, mild LVH, mild LAE, impaired LV relaxation  . PAF (paroxysmal atrial fibrillation) (HCC) 12/16/2013  . 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 Chronic back pain greater than 3 months duration 11/30/2014    Family History  Problem Relation Age of Onset  . Hypotension Mother   . Dementia Mother   . Alcohol abuse Father   . Heart disease Father     MI, enlarged heart  . Diabetes Brother     Social History   Social History  . Marital Status: Widowed    Spouse Name: N/A  . Number of Children: 4  . Years  of Education: N/A   Occupational History  . [employer]     9 hour days-6 days/week   Social History Main Topics  . Smoking status: Current Every Day Smoker -- 0.10 packs/day for 50 years    Types: Cigarettes    Last Attempt to Quit: 08/01/2012  . Smokeless tobacco: Former Neurosurgeon  . Alcohol Use: 0.0 oz/week    0 Standard drinks or equivalent per week     Comment: red wine occassionally  . Drug Use: No  . Sexual Activity: No   Other Topics Concern  . Not on file   Social History Narrative   Married, widow 1972          Allergies  Allergen Reactions  . Other Diarrhea, Rash and Other (See Comments)    Uncoded Allergy. Allergen: mycins  Reaction: Sore tongue, raw mouth, weakness  . Penicillins Other (See Comments)    Other Reaction: NEAR SYNCOPE  . Sulfa Antibiotics Shortness Of Breath  . Morphine Other (See Comments)    Other Reaction: GI UPSET  . Morphine Sulfate Nausea And Vomiting    At high doses hallucinations Can take in low doses  . Macrolides And Ketolides Diarrhea and Nausea And Vomiting    Only causes GI distress at extremely high doses per patient  . Iron Diarrhea  .  Naproxen Rash    Constitutional: Denies fever, malaise, fatigue, headache or abrupt weight changes.   GU: Pt reports urgency, frequency and pain with urination. Denies burning sensation, blood in urine, odor or discharge. Skin: Denies redness, rashes, lesions or ulcercations.   No other specific complaints in a complete review of systems (except as listed in HPI above).    Objective:   Physical Exam  BP 146/68 mmHg  Pulse 63  Temp(Src) 98.1 F (36.7 C) (Oral)  Wt 130 lb (58.968 kg)  SpO2 94%  Wt Readings from Last 3 Encounters:  01/10/15 130 lb (58.968 kg)  11/29/14 128 lb 4 oz (58.174 kg)  09/27/14 124 lb (56.246 kg)    General: Appears her stated age, chronically ill appearing in NAD. Cardiovascular: Normal rate and rhythm. S1,S2 noted.   Pulmonary/Chest: Normal effort and positive  vesicular breath sounds. No respiratory distress. No wheezes, rales or ronchi noted.  Abdomen: Soft. Normal bowel sounds. No distention or masses noted. No CVA tenderness.      Assessment & Plan:   Urgency, Frequency, Dysuria secondary to UTI:  Urinalysis: 3+ lueks Will send urine culture eRx sent if for Keflex 2T0 mg BID x 5 days OK to take AZO OTC Drink plenty of fluids  RTC as needed or if symptoms persist.

## 2015-01-10 NOTE — Progress Notes (Signed)
Pre visit review using our clinic review tool, if applicable. No additional management support is needed unless otherwise documented below in the visit note. 

## 2015-01-10 NOTE — Patient Instructions (Signed)

## 2015-01-13 LAB — URINE CULTURE: Colony Count: 70000

## 2015-01-19 ENCOUNTER — Ambulatory Visit (INDEPENDENT_AMBULATORY_CARE_PROVIDER_SITE_OTHER): Payer: Medicare Other | Admitting: Nurse Practitioner

## 2015-01-19 ENCOUNTER — Encounter: Payer: Self-pay | Admitting: Nurse Practitioner

## 2015-01-19 VITALS — BP 180/80 | HR 56 | Ht 60.0 in | Wt 130.8 lb

## 2015-01-19 DIAGNOSIS — I10 Essential (primary) hypertension: Secondary | ICD-10-CM | POA: Diagnosis not present

## 2015-01-19 DIAGNOSIS — I2510 Atherosclerotic heart disease of native coronary artery without angina pectoris: Secondary | ICD-10-CM | POA: Diagnosis not present

## 2015-01-19 DIAGNOSIS — I480 Paroxysmal atrial fibrillation: Secondary | ICD-10-CM

## 2015-01-19 DIAGNOSIS — I5032 Chronic diastolic (congestive) heart failure: Secondary | ICD-10-CM

## 2015-01-19 MED ORDER — RAMIPRIL 5 MG PO CAPS: 5.0000 mg | ORAL_CAPSULE | Freq: Two times a day (BID) | ORAL | Status: DC

## 2015-01-19 MED ORDER — POTASSIUM CHLORIDE CRYS ER 20 MEQ PO TBCR: 20.0000 meq | EXTENDED_RELEASE_TABLET | Freq: Every day | ORAL | Status: DC

## 2015-01-19 NOTE — Progress Notes (Signed)
CARDIOLOGY OFFICE NOTE  Date:  01/19/2015    [name] Date of Birth: [birth year] Medical Record #[id number]  PCP:  Hannah Beat, MD  Cardiologist:  Shirlee Latch    Chief Complaint  Patient presents with  . Coronary Artery Disease    Follow up visit - seen for Dr. Shirlee Latch  . Hypertension  . Atrial Fibrillation    History of Present Illness: JARIANA SHUMARD is a 81 y.o. female who presents today for a follow up visit. Seen for Dr. Shirlee Latch. She has a history of COPD, HTN, diabetes, and CAD. She was admitted to [facility] in 1/13 with a lower GI bleed that was thought to be diverticular. She required blood transfusion. Due to ongoing episodes of chest pain, she had LHC in 10/13, and she ended up having CABG x 4 per EBG. She did well post-op and was sent to a nursing home. Readmitted in 11/13 with acute on chronic diastolic CHF and an an acute exacerbation of COPD. She was diuresed and treated with antibiotics. Last echo in 12/2013 at [facility] showed EF 55%. Had had a lone episode of AF and went to [facility] back in September of 2015 - converted on her own and sounded like it was triggered by bronchitis. Echo updated and showed normal EF with mild LAE. Only on aspirin for her anticoagulation per her wishes.   I have seen her back several times earlier this year. Dog had died. She was smoking. Some swelling issues. Had a UTI.   Comes back today. Here alone.Came early today. Says she is no longer smoking - she is using some patches occasionally. Weight is up a few pounds - eating better. Limited by back pain. Her hips are hurting now - lots of arthritis. BP is up. Only on 5 mg of ACE. Singing in the choir. Going to exercise class twice a week - feels ok. Not short of breath. No swelling. Not dizzy. No more falls.    PMH:  1. COPD  2. HTN  3. Diabetes mellitus type II  4. ? AAA: Patient says she was told by [name] that she has an aneurysm. Abdominal US (10/12) showed no AAA.  5. Chronic  low back pain  6. Osteoarthritis: hip pain  7. Anemia  8. Excision of adrenal mass in 2007  9. Restless leg syndrome.  10. Bilateral shoulder replacements.  11. CAD: Cath in 2004 with moderate nonobstructive disease. Patient was admitted to [facility] in 8/12 with NSTEMI. LHC (8/12) showed severe diffuse disease of a small RCA. This was the culprit lesion but it was too small and diffusely diseased for intervention. There was a 70% proximal LAD stenosis. Lexiscan myoview (9/12) to assess for LAD territory ischemia showed a small fixed apical defect, low risk. EF 65% by myoview. CABG 10/13 with LIMA-LAD, SVG-D, SVG-PLOM, and SVG-RCA. Echo (11/13): EF 55-60%, grade I diastolic dysfunction, PA systolic pressure 38 mmHg.  12. Diverticular bleed 1/13 requiring microembolization by vascular surgery. Recurrent diverticular bleeding in 5/14.  13. ABIs (2/13) within normal limits.  14. Chronic diastolic CHF: Last echo (5/14) with EF 55-60%, mild LVH.   Past Medical History  Diagnosis Date  . Hypertension 08/01/1995  . Rheumatoid arthritis(714.0) 04/03/1983  . Osteoarthritis 04/03/1983  . Anemia, deficiency 07/01/2004    (w/u by Dr. Lorre Nick)  . Diabetes mellitus type II 07/31/1997  . Osteoporosis 12/31/2000  . CAD (coronary artery disease) 09/01/2002    a. s/p NSTEMI 2012. b. s/p CABG 01/2012.  [name]  Hyperlipemia 07/02/1999  . COPD (chronic obstructive pulmonary disease) (HCC) 05/31/2004  . diverticulosis of colon   . Arteriovenous malformation of gastrointestinal tract   . Fatty liver   . Chronic back pain   . Splenic artery aneurysm (HCC)   . Anxiety and depression   . GI bleed     a. 04/2011 felt diverticular requiring microembolization by vascular.  . Adrenal mass (HCC)     a. Excision 2007.  [name] Restless leg syndrome   . C. difficile colitis     a. 04/2008 = severe.  [name] Hx of CABG 04/18/2012  . Diastolic CHF (HCC)     a. 02/2012: resp failure with COPD exac/CHF/pleural eff.;  b.   Echocardiogram 08/29/12: EF 55-60%, mild LVH, mild LAE, impaired LV relaxation  . PAF (paroxysmal atrial fibrillation) (HCC) 12/16/2013  . 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 Chronic back pain greater than 3 months duration 11/30/2014    Past Surgical History  Procedure Laterality Date  . Nasal sinus surgery    . Inguinal hernia repair    . Partial hysterectomy      ovaries intact, dysmennorhea w/ A/P repari  . Carpal tunnel release      bilat  . Trigger finger release      right  . Wrist surgery      neuroma repair, right  . Ankle surgery      neuroma repair, right ORIF  . Total shoulder replacement  12/13/1998    right, ([place])  . Rotator cuff repair  11/22/2003    ([place]) left  . Epidural block injection  05/2005    x 3  . Long finger tendon realignment  10/31/2005    [place]  . Lumbar epidural  06/13/2006  . Laminectomy  06/2006    L4/5 spinal stenosis (Dr. [name])  . Laminectomy  02/24/2008    L3/4 Ant/Lat interbody fusion and Lat Artthrodesis w/plate using XLIF (Dr. [name])  . Colonic embolization  04/18/2011    Dr. Wyn Quaker, acute GI bleed  . Spinal cord stimulator implant    . Coronary artery bypass graft  01/22/2012    Procedure: CORONARY ARTERY BYPASS GRAFTING (CABG);  Surgeon: Delight Ovens, MD;  Location: [facility] OR;  Service: Open Heart Surgery;  Laterality: N/A;  Coronary Artery Bypass Graft times four utilizing the left internal mammary artery and the left greater saphenous vein harvested endoscopically.  Rhae Hammock without cardioversion  01/22/2012    Procedure: TRANSESOPHAGEAL ECHOCARDIOGRAM (TEE);  Surgeon: Delight Ovens, MD;  Location: [facility] OR;  Service: Open Heart Surgery;  Laterality: N/A;     Medications: Current Outpatient Prescriptions  Medication Sig Dispense Refill  . albuterol (PROAIR HFA) 108 (90 BASE) MCG/ACT inhaler Inhale 2 puffs into the lungs 4 (four) times daily. 18 g 11  . albuterol (PROVENTIL) (5 MG/ML) 0.5% nebulizer  solution Take 0.5 mLs (2.5 mg total) by nebulization every 6 (six) hours as needed for wheezing. 20 mL 12  . aspirin EC 81 MG tablet Take 1 tablet (81 mg total) by mouth daily. 90 tablet 3  . atorvastatin (LIPITOR) 40 MG tablet TAKE ONE TABLET BY MOUTH EVERY DAY 30 tablet 3  . Docusate Calcium (STOOL SOFTENER PO) Take 3 tablets by mouth daily.    . Fluticasone-Salmeterol (ADVAIR DISKUS) 500-50 MCG/DOSE AEPB Inhale 1 puff into the lungs 2 (two) times daily. 60 each 11  . furosemide (LASIX) 20 MG tablet Take 1 tablet (20 mg total) by mouth daily. 30  tablet 3  . Melatonin 5 MG TABS Take 1 tablet by mouth at bedtime.    . metoprolol tartrate (LOPRESSOR) 25 MG tablet Take 25 mg by mouth 2 (two) times daily.    [name] morphine (MS CONTIN) 15 MG 12 hr tablet Take 15 mg by mouth every 12 (twelve) hours.    . nitroGLYCERIN (NITROSTAT) 0.4 MG SL tablet Place 1 tablet (0.4 mg total) under the tongue every 5 (five) minutes as needed for chest pain. Max 3 per day 25 tablet 2  . Omega-3 Fatty Acids (FISH OIL) 1000 MG CAPS Take 1 capsule by mouth daily.    . OxyCODONE HCl ER 30 MG T12A Take 1 tablet by mouth 2 (two) times daily.    . potassium chloride SA (K-DUR,KLOR-CON) 20 MEQ tablet Take 1 tablet (20 mEq total) by mouth 2 (two) times daily. With lasix 30 tablet 3  . Probiotic Product (PHILLIPS COLON HEALTH PO) Take 1 capsule by mouth daily.    . ramipril (ALTACE) 5 MG capsule Take 1 capsule (5 mg total) by mouth daily. (Patient taking differently: Take 10 mg by mouth daily. ) 90 capsule 3  . tiotropium (SPIRIVA) 18 MCG inhalation capsule Place 1 capsule (18 mcg total) into inhaler and inhale daily. 30 capsule 12  . VITAMIN E PO Take by mouth.     No current facility-administered medications for this visit.    Allergies: Allergies  Allergen Reactions  . Other Diarrhea, Rash and Other (See Comments)    Uncoded Allergy. Allergen: mycins  Reaction: Sore tongue, raw mouth, weakness  . Penicillins Other (See  Comments)    Other Reaction: NEAR SYNCOPE  . Sulfa Antibiotics Shortness Of Breath  . Morphine Other (See Comments)    Other Reaction: GI UPSET  . Morphine Sulfate Nausea And Vomiting    At high doses hallucinations Can take in low doses  . Macrolides And Ketolides Diarrhea and Nausea And Vomiting    Only causes GI distress at extremely high doses per patient  . Iron Diarrhea  . Naproxen Rash    Social History: The patient  reports that she quit smoking about 4 [place] ago. Her smoking use included Cigarettes. She has a 5 pack-year smoking history. She has quit using smokeless tobacco. She reports that she drinks alcohol. She reports that she does not use illicit drugs.   Family History: The patient's family history includes Alcohol abuse in her father; Dementia in her mother; Diabetes in her brother; Heart disease in her father; Hypotension in her mother.   Review of Systems: Please see the history of present illness.   Otherwise, the review of systems is positive for none.   All other systems are reviewed and negative.   Physical Exam: VS:  BP 180/80 mmHg  Pulse 56  Ht 5' (1.524 m)  Wt 130 lb 12.8 oz (59.33 kg)  BMI 25.54 kg/m2  SpO2 93% .  BMI Body mass index is 25.54 kg/(m^2).  Wt Readings from Last 3 Encounters:  01/19/15 130 lb 12.8 oz (59.33 kg)  01/10/15 130 lb (58.968 kg)  11/29/14 128 lb 4 oz (58.174 kg)    General: Pleasant. Elderly female - looks chronically ill but in no acute distress. Very kyphotic.  HEENT: Normal. Neck: Supple, no JVD, carotid bruits, or masses noted.  Cardiac: Regular rate and rhythm. No murmurs, rubs, or gallops. No edema.  Respiratory:  Lungs are coarse with scattered rhonchi but with normal work of breathing.  GI: Soft and nontender.  MS: No deformity or atrophy. Gait and ROM intact. Skin: Warm and dry. Color is normal.  Neuro:  Strength and sensation are intact and no gross focal deficits noted.  Psych: Alert, appropriate and with  normal affect.   LABORATORY DATA:  EKG:  EKG is not ordered today.  Lab Results  Component Value Date   WBC 9.0 08/31/2014   HGB 11.9* 08/31/2014   HCT 36.8 08/31/2014   PLT 227.0 08/31/2014   GLUCOSE 110* 10/07/2014   CHOL 117 10/23/2013   TRIG 59.0 10/23/2013   HDL 31.30* 10/23/2013   LDLCALC 74 10/23/2013   ALT 9 08/31/2014   AST 16 08/31/2014   NA 139 10/07/2014   K 3.6 10/07/2014   CL 101 10/07/2014   CREATININE 0.73 10/07/2014   BUN 19 10/07/2014   CO2 30 10/07/2014   TSH 1.59 08/31/2014   INR 1.0 08/27/2012   HGBA1C 5.4 08/18/2014   MICROALBUR 0.5 07/19/2011    BNP (last 3 results) No results for input(s): BNP in the last 8760 hours.  ProBNP (last 3 results)  Recent Labs  08/31/14 1343 09/22/14 1357  PROBNP 144.0* 54.0     Other Studies Reviewed Today:   Assessment/Plan: 1. PAF - - she wishes to only be on aspirin therapy. She is in sinus by exam today   2. CAD - No active chest pain/symptoms - Would manage medically.  3. Diastolic HF - compensated at this time - no signs of swelling and her weight is stable. I will leave her on her current regimen. Ok to take Lasix every day.   4. Back pain -- followed in the pain clinic.  5. HTN - BP up. Will increase her Ramipril to twice a day. She says she has a cuff at home - will try to find and monitor. She is only taking one dose of potassium daily. Does not wish to have any labs today.   6. HLD - not fasting today.   7. Swelling - resolved  8 Situational stress - will take some time for her to get over  9. Tobacco abuse - says she is not smoking.   Current medicines are reviewed with the patient today.  The patient does not have concerns regarding medicines other than what has been noted above.  The following changes have been made:  See above.  Labs/ tests ordered today include:   No orders of the defined types were placed in this encounter.     Disposition:   FU with me in 3   months.   Patient is agreeable to this plan and will call if any problems develop in the interim.   Signed: Rosalio Macadamia, RN, ANP-C 01/19/2015 11:16 AM  [facility] [address] [place], [place]  [address] Phone: [phone] Fax: [phone]

## 2015-01-19 NOTE — Patient Instructions (Addendum)
We will be checking the following labs today - NONE   Medication Instructions:    Continue with your current medicines. BUT  I am increasing the Ramipril to twice a day  Potassium just once a day    Testing/Procedures To Be Arranged:  N/A  Follow-Up:   See me in 3 months    Other Special Instructions:   Find your blood pressure monitor and check your blood pressure for me. Call if staying above 160 all the time.   Call the [facility] office at [phone] if you have any questions, problems or concerns.

## 2015-01-24 ENCOUNTER — Encounter: Payer: Self-pay | Admitting: Internal Medicine

## 2015-01-24 ENCOUNTER — Ambulatory Visit (INDEPENDENT_AMBULATORY_CARE_PROVIDER_SITE_OTHER): Payer: Medicare Other | Admitting: Internal Medicine

## 2015-01-24 VITALS — BP 148/88 | HR 88 | Temp 98.3°F | Wt 131.0 lb

## 2015-01-24 DIAGNOSIS — I2510 Atherosclerotic heart disease of native coronary artery without angina pectoris: Secondary | ICD-10-CM

## 2015-01-24 DIAGNOSIS — N390 Urinary tract infection, site not specified: Secondary | ICD-10-CM

## 2015-01-24 DIAGNOSIS — R300 Dysuria: Secondary | ICD-10-CM

## 2015-01-24 LAB — POCT URINALYSIS DIPSTICK
Bilirubin, UA: NEGATIVE
Glucose, UA: NEGATIVE
Ketones, UA: NEGATIVE
Nitrite, UA: POSITIVE
PH UA: 5.5
Spec Grav, UA: >=1.03
UROBILINOGEN UA: 0.2

## 2015-01-24 MED ORDER — CIPROFLOXACIN HCL 250 MG PO TABS: 250.0000 mg | ORAL_TABLET | Freq: Two times a day (BID) | ORAL | Status: DC

## 2015-01-24 NOTE — Progress Notes (Signed)
Pre visit review using our clinic review tool, if applicable. No additional management support is needed unless otherwise documented below in the visit note. 

## 2015-01-24 NOTE — Patient Instructions (Signed)

## 2015-01-24 NOTE — Progress Notes (Signed)
HPI  Pt presents to the clinic today with c/o urinary urgency, frequency and dysuria. This started 101/10. She was treated for a UTI at that time with Keflex. Urine grew out [employer]. Her symptoms did improve while on the antibiotics but returned shortly after. She has not seen any blood in her urine. She denies fever, chills or low back pain. She has increased her fluids and taken AZO with some relief. She denies vaginal complaints.  Review of Systems  Past Medical History  Diagnosis Date  . Hypertension 08/01/1995  . Rheumatoid arthritis(714.0) 04/03/1983  . Osteoarthritis 04/03/1983  . Anemia, deficiency 07/01/2004    (w/u by Dr. Lorre Nick)  . Diabetes mellitus type II 07/31/1997  . Osteoporosis 12/31/2000  . CAD (coronary artery disease) 09/01/2002    a. s/p NSTEMI 2012. b. s/p CABG 01/2012.  [name] Hyperlipemia 07/02/1999  . COPD (chronic obstructive pulmonary disease) (HCC) 05/31/2004  . diverticulosis of colon   . Arteriovenous malformation of gastrointestinal tract   . Fatty liver   . Chronic back pain   . Splenic artery aneurysm (HCC)   . Anxiety and depression   . GI bleed     a. 04/2011 felt diverticular requiring microembolization by vascular.  . Adrenal mass (HCC)     a. Excision 2007.  [name] Restless leg syndrome   . C. difficile colitis     a. 04/2008 = severe.  [name] Hx of CABG 04/18/2012  . Diastolic CHF (HCC)     a. 02/2012: resp failure with COPD exac/CHF/pleural eff.;  b.  Echocardiogram 08/29/12: EF 55-60%, mild LVH, mild LAE, impaired LV relaxation  . PAF (paroxysmal atrial fibrillation) (HCC) 12/16/2013  . 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 Chronic back pain greater than 3 months duration 11/30/2014    Family History  Problem Relation Age of Onset  . Hypotension Mother   . Dementia Mother   . Alcohol abuse Father   . Heart disease Father     MI, enlarged heart  . Diabetes Brother     Social History   Social History  . Marital Status:  Widowed    Spouse Name: N/A  . Number of Children: 4  . Years of Education: N/A   Occupational History  . [employer]     9 hour days-6 days/week   Social History Main Topics  . Smoking status: Former Smoker -- 0.10 packs/day for 50 years    Types: Cigarettes    Quit date: 12/18/2014  . Smokeless tobacco: Former Neurosurgeon  . Alcohol Use: 0.0 oz/week    0 Standard drinks or equivalent per week     Comment: red wine occassionally  . Drug Use: No  . Sexual Activity: No   Other Topics Concern  . Not on file   Social History Narrative   Married, widow 1972          Allergies  Allergen Reactions  . Other Diarrhea, Rash and Other (See Comments)    Uncoded Allergy. Allergen: mycins  Reaction: Sore tongue, raw mouth, weakness  . Penicillins Other (See Comments)    Other Reaction: NEAR SYNCOPE  . Sulfa Antibiotics Shortness Of Breath  . Morphine Other (See Comments)    Other Reaction: GI UPSET  . Morphine Sulfate Nausea And Vomiting    At high doses hallucinations Can take in low doses  . Macrolides And Ketolides Diarrhea and Nausea And Vomiting    Only causes GI distress at extremely high doses per patient  .  Iron Diarrhea  . Naproxen Rash    Constitutional: Denies fever, malaise, fatigue, headache or abrupt weight changes.   GU: Pt reports urgency, frequency and pain with urination. Denies burning sensation, blood in urine, odor or discharge. Skin: Denies redness, rashes, lesions or ulcercations.   No other specific complaints in a complete review of systems (except as listed in HPI above).    Objective:   Physical Exam  BP 148/88 mmHg  Pulse 88  Temp(Src) 98.3 F (36.8 C) (Oral)  Wt 131 lb (59.421 kg)  SpO2 97% Wt Readings from Last 3 Encounters:  01/24/15 131 lb (59.421 kg)  01/19/15 130 lb 12.8 oz (59.33 kg)  01/10/15 130 lb (58.968 kg)    General: Appears her stated age, chronically ill appearing, in NAD. Cardiovascular: Normal rate and rhythm. S1,S2 noted.    Pulmonary/Chest: Normal effort and positive vesicular breath sounds. No respiratory distress. No wheezes, rales or ronchi noted.  Abdomen: Soft. Normal bowel sounds. No distention or masses noted.  Tender to palpation over the bladder area. No CVA tenderness.      Assessment & Plan:   Urgency, Frequency, Dysuria secondary to UTI:  Urinalysis: 3+ blood, pos nitrities, pos blood Will send urine culture eRx sent if for Cipro 250 mg BID x 10 days OK to take AZO OTC Drink plenty of fluids  RTC as needed or if symptoms persist.

## 2015-01-27 LAB — URINE CULTURE

## 2015-03-14 ENCOUNTER — Ambulatory Visit (INDEPENDENT_AMBULATORY_CARE_PROVIDER_SITE_OTHER): Payer: Medicare Other | Admitting: Family Medicine

## 2015-03-14 ENCOUNTER — Encounter: Payer: Self-pay | Admitting: Family Medicine

## 2015-03-14 ENCOUNTER — Ambulatory Visit (INDEPENDENT_AMBULATORY_CARE_PROVIDER_SITE_OTHER)
Admission: RE | Admit: 2015-03-14 | Discharge: 2015-03-14 | Disposition: A | Discharge status: Home / Self Care | Payer: Medicare Other | Source: Ambulatory Visit | Attending: Family Medicine | Admitting: Family Medicine

## 2015-03-14 VITALS — BP 162/76 | HR 81 | Temp 98.1°F | Ht 60.0 in | Wt 132.5 lb

## 2015-03-14 DIAGNOSIS — M25562 Pain in left knee: Secondary | ICD-10-CM

## 2015-03-14 DIAGNOSIS — M549 Dorsalgia, unspecified: Secondary | ICD-10-CM

## 2015-03-14 DIAGNOSIS — M1712 Unilateral primary osteoarthritis, left knee: Secondary | ICD-10-CM

## 2015-03-14 DIAGNOSIS — M25552 Pain in left hip: Secondary | ICD-10-CM

## 2015-03-14 DIAGNOSIS — G8929 Other chronic pain: Secondary | ICD-10-CM

## 2015-03-14 DIAGNOSIS — I2510 Atherosclerotic heart disease of native coronary artery without angina pectoris: Secondary | ICD-10-CM

## 2015-03-14 MED ORDER — METHYLPREDNISOLONE ACETATE 40 MG/ML IJ SUSP
80.0000 mg | Freq: Once | INTRAMUSCULAR | Status: AC
  Administered 2015-03-14: 80 mg via INTRA_ARTICULAR

## 2015-03-14 MED ORDER — TIZANIDINE HCL 4 MG PO TABS: 2.0000 mg | ORAL_TABLET | Freq: Every evening | ORAL | Status: DC

## 2015-03-14 NOTE — Progress Notes (Signed)
Dr. Karleen Hampshire T. Natori Gudino, MD, CAQ [facility] [address] [place], [address] Phone: [phone] Fax: [phone]  03/14/2015  Patient: [name], MRN: [id number], DOB: May 17, 1931, 81 y.o.  Primary Physician:  Hannah Beat, MD   Chief Complaint  Patient presents with  . Knee Pain    Left  . Hip Pain    Left   Subjective:   [name] is a 81 y.o. very pleasant female patient who presents with the following:  Left knee pain and left hip pain:  Going to exercise class twice a week - will not move in and out with pain in the knee. Her knee is hurting without any specific injury, and she is hurting primarily when she is doing some rotational movements and when she is and her exercise classes.  She has had only a minimal effusion.  She cannot take NSAIDs secondary to severe history of GI bleed in the past.  Also with pain in her posterior buttocks on the left - high buttocks.  She denies any groin pain.  She has long-standing issues with her back, status post multiple surgeries with failed spine syndrome and now on high doses of chronic pain management  L knee injection  Past Medical History, Surgical History, Social History, Family History, Problem List, Medications, and Allergies have been reviewed and updated if relevant.  Patient Active Problem List   Diagnosis Date Noted  . History of non-ST elevation myocardial infarction (NSTEMI) 07/22/2011    Priority: High  . GIB (gastrointestinal bleeding) 04/25/2011    Priority: High  . COPD with emphysema (HCC) 05/31/2004    Priority: High  . CAD (coronary artery disease) 09/01/2002    Priority: High  . Chronic back pain greater than 3 months duration 11/30/2014    Priority: Medium  . Smoking 02/09/2011    Priority: Medium  . AAA (abdominal aortic aneurysm) (HCC) 12/22/2010    Priority: Medium  . HYPERLIPIDEMIA 07/02/1999    Priority: Medium  . Diabetes mellitus type  2 with neurological manifestations (HCC) 07/31/1997    Priority: Medium  . Essential hypertension, benign 08/01/1995    Priority: Medium  . Rheumatoid arthritis(714.0) 04/03/1983    Priority: Medium  . Pedal edema 08/31/2014  . Dyspnea 08/31/2014  . 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 PAF (paroxysmal atrial fibrillation) (HCC) 12/16/2013  . History of GI diverticular bleed 09/25/2012  . Depression 06/17/2012  . Hx of CABG 04/18/2012  . Acute on chronic diastolic CHF (congestive heart failure) (HCC) 02/05/2012  . Splenic artery aneurysm (HCC)   . DIVERTICULOSIS OF COLON 05/14/2008  . ANXIETY DEPRESSION 04/19/2008  . ECZEMA 07/21/2007  . ARTERIOVENOUS MALFORMATION, GASTRIC 11/08/2006  . INSOMNIA, CHRONIC 07/21/2006  . ANEMIA-IRON DEFICIENCY 07/01/2004  . FATTY LIVER DISEASE 07/16/2002  . OSTEOPOROSIS 12/31/2000  . OSTEOARTHRITIS 04/03/1983    Past Medical History  Diagnosis Date  . Hypertension 08/01/1995  . Rheumatoid arthritis(714.0) 04/03/1983  . Osteoarthritis 04/03/1983  . Anemia, deficiency 07/01/2004    (w/u by Dr. Lorre Nick)  . Diabetes mellitus type II 07/31/1997  . Osteoporosis 12/31/2000  . CAD (coronary artery disease) 09/01/2002    a. s/p NSTEMI 2012. b. s/p CABG 01/2012.  [name] Hyperlipemia 07/02/1999  . COPD (chronic obstructive pulmonary disease) (HCC) 05/31/2004  . diverticulosis of colon   . Arteriovenous malformation of gastrointestinal tract   . Fatty liver   . Chronic back pain   . Splenic artery  aneurysm (HCC)   . Anxiety and depression   . GI bleed     a. 04/2011 felt diverticular requiring microembolization by vascular.  . Adrenal mass (HCC)     a. Excision 2007.  [name] Restless leg syndrome   . C. difficile colitis     a. 04/2008 = severe.  [name] Hx of CABG 04/18/2012  . Diastolic CHF (HCC)     a. 02/2012: resp failure with COPD exac/CHF/pleural eff.;  b.  Echocardiogram 08/29/12: EF 55-60%, mild LVH, mild LAE, impaired LV relaxation   . PAF (paroxysmal atrial fibrillation) (HCC) 12/16/2013  . 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 Chronic back pain greater than 3 months duration 11/30/2014    Past Surgical History  Procedure Laterality Date  . Nasal sinus surgery    . Inguinal hernia repair    . Partial hysterectomy      ovaries intact, dysmennorhea w/ A/P repari  . Carpal tunnel release      bilat  . Trigger finger release      right  . Wrist surgery      neuroma repair, right  . Ankle surgery      neuroma repair, right ORIF  . Total shoulder replacement  12/13/1998    right, ([facility])  . Rotator cuff repair  11/22/2003    ([facility]) left  . Epidural block injection  05/2005    x 3  . Long finger tendon realignment  10/31/2005    [facility]  . Lumbar epidural  06/13/2006  . Laminectomy  06/2006    L4/5 spinal stenosis (Dr. [name])  . Laminectomy  02/24/2008    L3/4 Ant/Lat interbody fusion and Lat Artthrodesis w/plate using XLIF (Dr. [name])  . Colonic embolization  04/18/2011    Dr. Wyn Quaker, acute GI bleed  . Spinal cord stimulator implant    . Coronary artery bypass graft  01/22/2012    Procedure: CORONARY ARTERY BYPASS GRAFTING (CABG);  Surgeon: Delight Ovens, MD;  Location: [facility] OR;  Service: Open Heart Surgery;  Laterality: N/A;  Coronary Artery Bypass Graft times four utilizing the left internal mammary artery and the left greater saphenous vein harvested endoscopically.  Rhae Hammock without cardioversion  01/22/2012    Procedure: TRANSESOPHAGEAL ECHOCARDIOGRAM (TEE);  Surgeon: Delight Ovens, MD;  Location[facility] OR;  Service: Open Heart Surgery;  Laterality: N/A;    Social History   Social History  . Marital Status: Widowed    Spouse Name: N/A  . Number of Children: 4  . Years of Education: N/A   Occupational History  . [employer]     9 hour days-6 days/week   Social History Main Topics  . Smoking status: Former Smoker -- 0.10 packs/day for 50 years    Types: Cigarettes    Quit date:  12/18/2014  . Smokeless tobacco: Former Neurosurgeon  . Alcohol Use: 0.0 oz/week    0 Standard drinks or equivalent per week     Comment: red wine occassionally  . Drug Use: No  . Sexual Activity: No   Other Topics Concern  . Not on file   Social History Narrative   Married, widow 65          Family History  Problem Relation Age of Onset  . Hypotension Mother   . Dementia Mother   . Alcohol abuse Father   . Heart disease Father     MI, enlarged heart  . Diabetes Brother     Allergies  Allergen Reactions  .  Other Diarrhea, Rash and Other (See Comments)    Uncoded Allergy. Allergen: mycins  Reaction: Sore tongue, raw mouth, weakness  . Penicillins Other (See Comments)    Other Reaction: NEAR SYNCOPE  . Sulfa Antibiotics Shortness Of Breath  . Morphine Other (See Comments)    Other Reaction: GI UPSET  . Morphine Sulfate Nausea And Vomiting    At high doses hallucinations Can take in low doses  . Macrolides And Ketolides Diarrhea and Nausea And Vomiting    Only causes GI distress at extremely high doses per patient  . Iron Diarrhea  . Naproxen Rash    Medication list reviewed and updated in full in [facility].  GEN: No fevers, chills. Nontoxic. Primarily MSK c/o today. MSK: Detailed in the HPI GI: tolerating PO intake without difficulty Neuro: No numbness, parasthesias, or tingling associated. Otherwise the pertinent positives of the ROS are noted above.   Objective:   BP 162/76 mmHg  Pulse 81  Temp(Src) 98.1 F (36.7 C) (Oral)  Ht 5' (1.524 m)  Wt 132 lb 8 oz (60.102 kg)  BMI 25.88 kg/m2   GEN: WDWN, NAD, Non-toxic, Alert & Oriented x 3 HEENT: Atraumatic, Normocephalic.  Ears and Nose: No external deformity. EXTR: No clubbing/cyanosis/edema NEURO: Normal gait.  PSYCH: Normally interactive. Conversant. Not depressed or anxious appearing.  Calm demeanor.   Knee:  L knee Gait: Normal heel toe pattern ROM: 0 - 120 Effusion: minimal Echymosis or  edema: none Patellar tendon NT Painful PLICA: neg Patellar grind: negative Medial and lateral patellar facet loading: negative medial and lateral joint lines: medial tenderness Mcmurray's neg Flexion-pinch neg Varus and valgus stress: stable Lachman: neg Ant and Post drawer: neg Hip abduction, IR, ER: WNL Hip flexion str: 5/5 Hip abd: 5/5 Quad: 5/5 VMO atrophy:mild Hamstring concentric and eccentric: 5/5    HIP EXAM: SIDE: L ROM: Abduction, Flexion, Internal and External range of motion: approaching full Pain with terminal IROM and EROM: none GTB: NT SLR: NEG Knees: No effusion FABER: NT REVERSE FABER: NT, neg Piriformis: NT at direct palpation Str: flexion: 5/5 abduction: 5/5 adduction: 5/5 Strength testing non-tender     Radiology: Dg Hip Unilat With Pelvis 2-3 Views Left  03/14/2015  CLINICAL DATA:  Left hip pain, no trauma EXAM: DG HIP (WITH OR WITHOUT PELVIS) 2-3V LEFT COMPARISON:  None. FINDINGS: No fracture or dislocation is seen. Mild degenerative changes. Visualized bony pelvis appears intact. Postsurgical changes involving the lower lumbar spine with associated moderate degenerative changes. IMPRESSION: No fracture or dislocation is seen. Mild degenerative changes of the left hip. Electronically Signed   By: Charline Bills M.D.   On: 03/14/2015 13:54   Dg Knee Ap/lat W/sunrise Left  03/14/2015  CLINICAL DATA:  81 year old female with left knee pain. No history of trauma. EXAM: LEFT KNEE 3 VIEWS COMPARISON:  Concurrently obtained radiographs of the pelvis and left hip FINDINGS: No evidence of acute fracture, malalignment or knee joint effusion. Normal bony mineralization. No lytic or blastic osseous lesions. Mild chondrocalcinosis in the lateral compartment. Degenerative changes present in the medial compartment were there is joint space narrowing, and osteophyte formation. Atherosclerotic calcifications present in the superficial femoral, popliteal and runoff  arteries. IMPRESSION: 1. No acute fracture, malalignment or joint effusion. 2. Chondrocalcinosis. 3. Medial compartment degenerative osteoarthritis. 4. Atherosclerotic vascular calcifications. Electronically Signed   By: Malachy Moan M.D.   On: 03/14/2015 13:54     Assessment and Plan:   Primary osteoarthritis of left knee  Left  knee pain - Plan: DG Knee AP/LAT W/Sunrise Left, methylPREDNISolone acetate (DEPO-MEDROL) injection 80 mg  Left hip pain - Plan: DG HIP UNILAT WITH PELVIS 2-3 VIEWS LEFT  Chronic back pain greater than 3 months duration  Likely knee OA flare  Hip pain is referred from back - not intraarticular hip.   Zanaflex for acute back flare up and try to be active if possible  inj l knee for oa flare  Knee Injection, L Patient verbally consented to procedure. Risks (including potential rare risk of infection), benefits, and alternatives explained. Sterilely prepped with Chloraprep. Ethyl cholride used for anesthesia. 8 cc Lidocaine 1% mixed with 2 mL Depo-Medrol 40 mg injected using the anteromedial approach without difficulty. No complications with procedure and tolerated well. Patient had decreased pain post-injection.   Follow-up: No Follow-up on file.  New Prescriptions   No medications on file   Modified Medications   Modified Medication Previous Medication   TIZANIDINE (ZANAFLEX) 4 MG TABLET tiZANidine (ZANAFLEX) 4 MG tablet      Take 0.5-1 tablets (2-4 mg total) by mouth Nightly.    Take 0.5-1 tablets (2-4 mg total) by mouth Nightly.   Orders Placed This Encounter  Procedures  . DG Knee AP/LAT W/Sunrise Left  . DG HIP UNILAT WITH PELVIS 2-3 VIEWS LEFT    Signed,  Chanette Demo T. Gaige Sebo, MD   Patient's Medications  New Prescriptions   No medications on file  Previous Medications   ALBUTEROL (PROAIR HFA) 108 (90 BASE) MCG/ACT INHALER    Inhale 2 puffs into the lungs 4 (four) times daily.   ALBUTEROL (PROVENTIL) (5 MG/ML) 0.5% NEBULIZER SOLUTION     Take 0.5 mLs (2.5 mg total) by nebulization every 6 (six) hours as needed for wheezing.   ASPIRIN EC 81 MG TABLET    Take 1 tablet (81 mg total) by mouth daily.   ATORVASTATIN (LIPITOR) 40 MG TABLET    TAKE ONE TABLET BY MOUTH EVERY DAY   CIPROFLOXACIN (CIPRO) 250 MG TABLET    Take 1 tablet (250 mg total) by mouth 2 (two) times daily.   DOCUSATE CALCIUM (STOOL SOFTENER PO)    Take 3 tablets by mouth daily.   FLUTICASONE-SALMETEROL (ADVAIR DISKUS) 500-50 MCG/DOSE AEPB    Inhale 1 puff into the lungs 2 (two) times daily.   FUROSEMIDE (LASIX) 20 MG TABLET    Take 1 tablet (20 mg total) by mouth daily.   MELATONIN 5 MG TABS    Take 1 tablet by mouth at bedtime.   METOPROLOL TARTRATE (LOPRESSOR) 25 MG TABLET    Take 25 mg by mouth 2 (two) times daily.   MORPHINE (MS CONTIN) 15 MG 12 HR TABLET    Take 15 mg by mouth every 12 (twelve) hours.   NITROGLYCERIN (NITROSTAT) 0.4 MG SL TABLET    Place 1 tablet (0.4 mg total) under the tongue every 5 (five) minutes as needed for chest pain. Max 3 per day   OMEGA-3 FATTY ACIDS (FISH OIL) 1000 MG CAPS    Take 1 capsule by mouth daily.   OXYCODONE HCL ER 30 MG T12A    Take 1 tablet by mouth 2 (two) times daily.   POTASSIUM CHLORIDE SA (K-DUR,KLOR-CON) 20 MEQ TABLET    Take 1 tablet (20 mEq total) by mouth daily. With lasix   PROBIOTIC PRODUCT (PHILLIPS COLON HEALTH PO)    Take 1 capsule by mouth daily.   RAMIPRIL (ALTACE) 5 MG CAPSULE    Take 1 capsule (5 mg  total) by mouth 2 (two) times daily.   TIOTROPIUM (SPIRIVA) 18 MCG INHALATION CAPSULE    Place 1 capsule (18 mcg total) into inhaler and inhale daily.   VITAMIN E PO    Take by mouth.  Modified Medications   Modified Medication Previous Medication   TIZANIDINE (ZANAFLEX) 4 MG TABLET tiZANidine (ZANAFLEX) 4 MG tablet      Take 0.5-1 tablets (2-4 mg total) by mouth Nightly.    Take 0.5-1 tablets (2-4 mg total) by mouth Nightly.  Discontinued Medications   No medications on file

## 2015-03-14 NOTE — Progress Notes (Signed)
Pre visit review using our clinic review tool, if applicable. No additional management support is needed unless otherwise documented below in the visit note. 

## 2015-03-31 ENCOUNTER — Emergency Department: Payer: Medicare Other

## 2015-03-31 ENCOUNTER — Emergency Department
Admission: EM | Admit: 2015-03-31 | Discharge: 2015-03-31 | Disposition: A | Discharge status: Home / Self Care | Payer: Medicare Other | Attending: Emergency Medicine | Admitting: Emergency Medicine

## 2015-03-31 DIAGNOSIS — M545 Low back pain: Secondary | ICD-10-CM | POA: Insufficient documentation

## 2015-03-31 DIAGNOSIS — Z87891 Personal history of nicotine dependence: Secondary | ICD-10-CM | POA: Diagnosis not present

## 2015-03-31 DIAGNOSIS — J441 Chronic obstructive pulmonary disease with (acute) exacerbation: Secondary | ICD-10-CM | POA: Diagnosis not present

## 2015-03-31 DIAGNOSIS — Z79899 Other long term (current) drug therapy: Secondary | ICD-10-CM | POA: Insufficient documentation

## 2015-03-31 DIAGNOSIS — Z7982 Long term (current) use of aspirin: Secondary | ICD-10-CM | POA: Insufficient documentation

## 2015-03-31 DIAGNOSIS — G8929 Other chronic pain: Secondary | ICD-10-CM | POA: Diagnosis not present

## 2015-03-31 DIAGNOSIS — Z792 Long term (current) use of antibiotics: Secondary | ICD-10-CM | POA: Insufficient documentation

## 2015-03-31 DIAGNOSIS — Z79891 Long term (current) use of opiate analgesic: Secondary | ICD-10-CM | POA: Diagnosis not present

## 2015-03-31 DIAGNOSIS — Z7951 Long term (current) use of inhaled steroids: Secondary | ICD-10-CM | POA: Insufficient documentation

## 2015-03-31 DIAGNOSIS — E1149 Type 2 diabetes mellitus with other diabetic neurological complication: Secondary | ICD-10-CM | POA: Diagnosis not present

## 2015-03-31 DIAGNOSIS — R0602 Shortness of breath: Secondary | ICD-10-CM | POA: Diagnosis present

## 2015-03-31 DIAGNOSIS — Z880 Allergy status to penicillin: Secondary | ICD-10-CM | POA: Diagnosis not present

## 2015-03-31 DIAGNOSIS — I10 Essential (primary) hypertension: Secondary | ICD-10-CM | POA: Insufficient documentation

## 2015-03-31 LAB — COMPREHENSIVE METABOLIC PANEL
ALBUMIN: 3.8 g/dL (ref 3.5–5.0)
ALK PHOS: 55 U/L (ref 38–126)
ALT: 12 U/L — AB (ref 14–54)
AST: 15 U/L (ref 15–41)
Anion gap: 4 — ABNORMAL LOW (ref 5–15)
BILIRUBIN TOTAL: 0.4 mg/dL (ref 0.3–1.2)
BUN: 17 mg/dL (ref 6–20)
CALCIUM: 8.8 mg/dL — AB (ref 8.9–10.3)
CO2: 32 mmol/L (ref 22–32)
CREATININE: 0.6 mg/dL (ref 0.44–1.00)
Chloride: 103 mmol/L (ref 101–111)
GFR calc non Af Amer: >60 mL/min (ref >60)
GLUCOSE: 195 mg/dL — AB (ref 65–99)
Potassium: 3.5 mmol/L (ref 3.5–5.1)
SODIUM: 139 mmol/L (ref 135–145)
Total Protein: 7.3 g/dL (ref 6.5–8.1)

## 2015-03-31 LAB — CBC
HEMATOCRIT: 34.8 % — AB (ref 35.0–47.0)
HEMOGLOBIN: 11.5 g/dL — AB (ref 12.0–16.0)
MCH: 29.4 pg (ref 26.0–34.0)
MCHC: 33.2 g/dL (ref 32.0–36.0)
MCV: 88.8 fL (ref 80.0–100.0)
Platelets: 185 10*3/uL (ref 150–440)
RBC: 3.92 MIL/uL (ref 3.80–5.20)
RDW: 13.9 % (ref 11.5–14.5)
WBC: 7.3 10*3/uL (ref 3.6–11.0)

## 2015-03-31 MED ORDER — METHYLPREDNISOLONE SODIUM SUCC 125 MG IJ SOLR: 125.0000 mg | Freq: Once | INTRAMUSCULAR | Status: DC

## 2015-03-31 MED ORDER — IPRATROPIUM-ALBUTEROL 0.5-2.5 (3) MG/3ML IN SOLN
3.0000 mL | Freq: Once | RESPIRATORY_TRACT | Status: AC
  Administered 2015-03-31: 3 mL via RESPIRATORY_TRACT
  Filled 2015-03-31: qty 3

## 2015-03-31 MED ORDER — PREDNISONE 20 MG PO TABS: 40.0000 mg | ORAL_TABLET | Freq: Every day | ORAL | Status: DC

## 2015-03-31 MED ORDER — METHYLPREDNISOLONE SODIUM SUCC 125 MG IJ SOLR
125.0000 mg | Freq: Once | INTRAMUSCULAR | Status: AC
  Administered 2015-03-31: 125 mg via INTRAVENOUS
  Filled 2015-03-31: qty 2

## 2015-03-31 MED ORDER — ALBUTEROL SULFATE (2.5 MG/3ML) 0.083% IN NEBU
5.0000 mg | INHALATION_SOLUTION | Freq: Once | RESPIRATORY_TRACT | Status: AC
  Administered 2015-03-31: 5 mg via RESPIRATORY_TRACT
  Filled 2015-03-31: qty 6

## 2015-03-31 MED ORDER — ALBUTEROL SULFATE (2.5 MG/3ML) 0.083% IN NEBU: 5.0000 mg | INHALATION_SOLUTION | Freq: Once | RESPIRATORY_TRACT | Status: DC

## 2015-03-31 NOTE — Discharge Instructions (Signed)
Please seek medical attention for any high fevers, chest pain, shortness of breath, change in behavior, persistent vomiting, bloody stool or any other new or concerning symptoms. ° ° °Chronic Obstructive Pulmonary Disease Exacerbation °Chronic obstructive pulmonary disease (COPD) is a common lung problem. In COPD, the flow of air from the lungs is limited. COPD exacerbations are times that breathing gets worse and you need extra treatment. Without treatment they can be life threatening. If they happen often, your lungs can become more damaged. If your COPD gets worse, your doctor may treat you with: °· Medicines. °· Oxygen. °· Different ways to clear your airway, such as using a mask. °HOME CARE °· Do not smoke. °· Avoid tobacco smoke and other things that bother your lungs. °· If given, take your antibiotic medicine as told. Finish the medicine even if you start to feel better. °· Only take medicines as told by your doctor. °· Drink enough fluids to keep your pee (urine) clear or pale yellow (unless your doctor has told you not to). °· Use a cool mist machine (vaporizer). °· If you use oxygen or a machine that turns liquid medicine into a mist (nebulizer), continue to use them as told. °· Keep up with shots (vaccinations) as told by your doctor. °· Exercise regularly. °· Eat healthy foods. °· Keep all doctor visits as told. °GET HELP RIGHT AWAY IF: °· You are very short of breath and it gets worse. °· You have trouble talking. °· You have bad chest pain. °· You have blood in your spit (sputum). °· You have a fever. °· You keep throwing up (vomiting). °· You feel weak, or you pass out (faint). °· You feel confused. °· You keep getting worse. °MAKE SURE YOU: °· Understand these instructions. °· Will watch your condition. °· Will get help right away if you are not doing well or get worse. °  °This information is not intended to replace advice given to you by your health care provider. Make sure you discuss any  questions you have with your health care provider. °  °Document Released: 03/08/2011 Document Revised: 04/09/2014 Document Reviewed: 11/21/2012 °Elsevier Interactive Patient Education ©2016 [employer] ° °

## 2015-03-31 NOTE — ED Notes (Signed)
Pt assisted to bathroom

## 2015-03-31 NOTE — ED Notes (Signed)
Pt c/o increased SOB for the past 2 days.. Pt is tachypneic in triage.. Unable to complete a sentence.. States she is on home O2 at night only, has been using an inhaler without any relief.

## 2015-03-31 NOTE — ED Provider Notes (Signed)
[facility] Emergency Department Provider Note   ____________________________________________  Time seen: 1510  I have reviewed the triage vital signs and the nursing notes.   HISTORY  Chief Complaint Shortness of Breath   History limited by: Not Limited   HPI [name] is a 81 y.o. female with history of COPD who presents to the emergency department today because of concerns for breathing difficulty. The patient states started 3 days ago. She states it progressively has gotten worse over that time. She has tried her breathing treatments at home without any great relief. She thinks that it started when she stopped smoking 3 days ago. She denies any pain in her chest. She has had a slight cough without any significant production. No fevers. Patient denies any abdominal pain nausea vomiting or diarrhea.  At the time of my exam the patient had already received 1 DuoNeb and stated she felt much better. She stated she felt ready to go home.   Past Medical History  Diagnosis Date  . Hypertension 08/01/1995  . Rheumatoid arthritis(714.0) 04/03/1983  . Osteoarthritis 04/03/1983  . Anemia, deficiency 07/01/2004    (w/u by Dr. Lorre Nick)  . Diabetes mellitus type II 07/31/1997  . Osteoporosis 12/31/2000  . CAD (coronary artery disease) 09/01/2002    a. s/p NSTEMI 2012. b. s/p CABG 01/2012.  [name] Hyperlipemia 07/02/1999  . COPD (chronic obstructive pulmonary disease) (HCC) 05/31/2004  . diverticulosis of colon   . Arteriovenous malformation of gastrointestinal tract   . Fatty liver   . Chronic back pain   . Splenic artery aneurysm (HCC)   . Anxiety and depression   . GI bleed     a. 04/2011 felt diverticular requiring microembolization by vascular.  . Adrenal mass (HCC)     a. Excision 2007.  [name] Restless leg syndrome   . C. difficile colitis     a. 04/2008 = severe.  [name] Hx of CABG 04/18/2012  . Diastolic CHF (HCC)     a. 02/2012: resp failure with COPD  exac/CHF/pleural eff.;  b.  Echocardiogram 08/29/12: EF 55-60%, mild LVH, mild LAE, impaired LV relaxation  . PAF (paroxysmal atrial fibrillation) (HCC) 12/16/2013  . 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 Chronic back pain greater than 3 months duration 11/30/2014    Patient Active Problem List   Diagnosis Date Noted  . Chronic back pain greater than 3 months duration 11/30/2014  . Pedal edema 08/31/2014  . Dyspnea 08/31/2014  . 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 PAF (paroxysmal atrial fibrillation) (HCC) 12/16/2013  . History of GI diverticular bleed 09/25/2012  . Depression 06/17/2012  . Hx of CABG 04/18/2012  . Acute on chronic diastolic CHF (congestive heart failure) (HCC) 02/05/2012  . Splenic artery aneurysm (HCC)   . History of non-ST elevation myocardial infarction (NSTEMI) 07/22/2011  . GIB (gastrointestinal bleeding) 04/25/2011  . Smoking 02/09/2011  . AAA (abdominal aortic aneurysm) (HCC) 12/22/2010  . DIVERTICULOSIS OF COLON 05/14/2008  . ANXIETY DEPRESSION 04/19/2008  . ECZEMA 07/21/2007  . ARTERIOVENOUS MALFORMATION, GASTRIC 11/08/2006  . INSOMNIA, CHRONIC 07/21/2006  . ANEMIA-IRON DEFICIENCY 07/01/2004  . 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 CAD (coronary artery disease) 09/01/2002  . FATTY LIVER DISEASE 07/16/2002  . OSTEOPOROSIS 12/31/2000  . HYPERLIPIDEMIA 07/02/1999  . 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 Essential hypertension, benign 08/01/1995  . Rheumatoid arthritis(714.0) 04/03/1983  . OSTEOARTHRITIS 04/03/1983    Past Surgical  History  Procedure Laterality Date  . Nasal sinus surgery    . Inguinal hernia repair    . Partial hysterectomy      ovaries intact, dysmennorhea w/ A/P repari  . Carpal tunnel release      bilat  . Trigger finger release      right  . Wrist surgery      neuroma repair, right  . Ankle surgery      neuroma repair, right ORIF  .  Total shoulder replacement  12/13/1998    right, ([facility])  . Rotator cuff repair  11/22/2003    ([facility]) left  . Epidural block injection  05/2005    x 3  . Long finger tendon realignment  10/31/2005    Meyerdierks  . Lumbar epidural  06/13/2006  . Laminectomy  06/2006    L4/5 spinal stenosis (Dr. [name])  . Laminectomy  02/24/2008    L3/4 Ant/Lat interbody fusion and Lat Artthrodesis w/plate using XLIF (Dr. [name])  . Colonic embolization  04/18/2011    Dr. Wyn Quaker, acute GI bleed  . Spinal cord stimulator implant    . Coronary artery bypass graft  01/22/2012    Procedure: CORONARY ARTERY BYPASS GRAFTING (CABG);  Surgeon: Delight Ovens, MD;  Location: [facility] OR;  Service: Open Heart Surgery;  Laterality: N/A;  Coronary Artery Bypass Graft times four utilizing the left internal mammary artery and the left greater saphenous vein harvested endoscopically.  Rhae Hammock without cardioversion  01/22/2012    Procedure: TRANSESOPHAGEAL ECHOCARDIOGRAM (TEE);  Surgeon: Delight Ovens, MD;  Location: [facility] OR;  Service: Open Heart Surgery;  Laterality: N/A;    Current Outpatient Rx  Name  Route  Sig  Dispense  Refill  . albuterol (PROAIR HFA) 108 (90 BASE) MCG/ACT inhaler   Inhalation   Inhale 2 puffs into the lungs 4 (four) times daily.   18 g   11   . albuterol (PROVENTIL) (5 MG/ML) 0.5% nebulizer solution   Nebulization   Take 0.5 mLs (2.5 mg total) by nebulization every 6 (six) hours as needed for wheezing.   20 mL   12   . aspirin EC 81 MG tablet   Oral   Take 1 tablet (81 mg total) by mouth daily.   90 tablet   3   . atorvastatin (LIPITOR) 40 MG tablet      TAKE ONE TABLET BY MOUTH EVERY DAY   30 tablet   3   . ciprofloxacin (CIPRO) 250 MG tablet   Oral   Take 1 tablet (250 mg total) by mouth 2 (two) times daily.   20 tablet   0   . Docusate Calcium (STOOL SOFTENER PO)   Oral   Take 3 tablets by mouth daily.         . Fluticasone-Salmeterol (ADVAIR DISKUS) 500-50 MCG/DOSE  AEPB   Inhalation   Inhale 1 puff into the lungs 2 (two) times daily.   60 each   11   . furosemide (LASIX) 20 MG tablet   Oral   Take 1 tablet (20 mg total) by mouth daily.   30 tablet   3   . Melatonin 5 MG TABS   Oral   Take 1 tablet by mouth at bedtime.         . metoprolol tartrate (LOPRESSOR) 25 MG tablet   Oral   Take 25 mg by mouth 2 (two) times daily.         [name] morphine (MS CONTIN) 15  MG 12 hr tablet   Oral   Take 15 mg by mouth every 12 (twelve) hours.         . nitroGLYCERIN (NITROSTAT) 0.4 MG SL tablet   Sublingual   Place 1 tablet (0.4 mg total) under the tongue every 5 (five) minutes as needed for chest pain. Max 3 per day   25 tablet   2   . Omega-3 Fatty Acids (FISH OIL) 1000 MG CAPS   Oral   Take 1 capsule by mouth daily.         . OxyCODONE HCl ER 30 MG T12A   Oral   Take 1 tablet by mouth 2 (two) times daily.         . potassium chloride SA (K-DUR,KLOR-CON) 20 MEQ tablet   Oral   Take 1 tablet (20 mEq total) by mouth daily. With lasix   30 tablet   3   . Probiotic Product (PHILLIPS COLON HEALTH PO)   Oral   Take 1 capsule by mouth daily.         . ramipril (ALTACE) 5 MG capsule   Oral   Take 1 capsule (5 mg total) by mouth 2 (two) times daily.   180 capsule   3   . tiotropium (SPIRIVA) 18 MCG inhalation capsule   Inhalation   Place 1 capsule (18 mcg total) into inhaler and inhale daily.   30 capsule   12   . tiZANidine (ZANAFLEX) 4 MG tablet   Oral   Take 0.5-1 tablets (2-4 mg total) by mouth Nightly.   30 tablet   5   . VITAMIN E PO   Oral   Take by mouth.           Allergies Other; Penicillins; Sulfa antibiotics; Morphine; Morphine sulfate; Macrolides and ketolides; Iron; and Naproxen  Family History  Problem Relation Age of Onset  . Hypotension Mother   . Dementia Mother   . Alcohol abuse Father   . Heart disease Father     MI, enlarged heart  . Diabetes Brother     Social History Social History   Substance Use Topics  . Smoking status: Former Smoker -- 0.10 packs/day for 50 years    Types: Cigarettes    Quit date: 12/18/2014  . Smokeless tobacco: Former Neurosurgeon  . Alcohol Use: 0.0 oz/week    0 Standard drinks or equivalent per week     Comment: red wine occassionally    Review of Systems  Constitutional: Negative for fever. Cardiovascular: Negative for chest pain. Respiratory: Positive for shortness of breath. Gastrointestinal: Negative for abdominal pain, vomiting and diarrhea. Genitourinary: Negative for dysuria. Musculoskeletal: Positive for chronic back pain. Neurological: Negative for headaches, focal weakness or numbness. 10-point ROS otherwise negative.  ____________________________________________   PHYSICAL EXAM:  VITAL SIGNS: ED Triage Vitals  Enc Vitals Group     BP 03/31/15 1350 166/74 mmHg     Pulse Rate 03/31/15 1350 101     Resp 03/31/15 1350 30     Temp 03/31/15 1350 97.8 F (36.6 C)     Temp Source 03/31/15 1350 Oral     SpO2 03/31/15 1350 88 %     Weight 03/31/15 1350 134 lb (60.782 kg)     Height 03/31/15 1350 5' (1.524 m)     Head Cir --      Peak Flow --      Pain Score 03/31/15 1352 8   Constitutional: Alert and oriented. Well appearing and in  no distress. Eyes: Conjunctivae are normal. PERRL. Normal extraocular movements. ENT   Head: Normocephalic and atraumatic.   Nose: No congestion/rhinnorhea.   Mouth/Throat: Mucous membranes are moist.   Neck: No stridor. Hematological/Lymphatic/Immunilogical: No cervical lymphadenopathy. Cardiovascular: Normal rate, regular rhythm.  No murmurs, rubs, or gallops. Respiratory: Normal respiratory effort without tachypnea nor retractions. Diffuse wheezing. Gastrointestinal: Soft and nontender. No distention.  Genitourinary: Deferred Musculoskeletal: Normal range of motion in all extremities. No joint effusions.  No lower extremity tenderness nor edema. Neurologic:  Normal speech and  language. No gross focal neurologic deficits are appreciated.  Skin:  Skin is warm, dry and intact. No rash noted. Psychiatric: Mood and affect are normal. Speech and behavior are normal. Patient exhibits appropriate insight and judgment.  ____________________________________________    LABS (pertinent positives/negatives)  Labs Reviewed  CBC - Abnormal; Notable for the following:    Hemoglobin 11.5 (*)    HCT 34.8 (*)    All other components within normal limits  COMPREHENSIVE METABOLIC PANEL - Abnormal; Notable for the following:    Glucose, Bld 195 (*)    Calcium 8.8 (*)    ALT 12 (*)    Anion gap 4 (*)    All other components within normal limits     ____________________________________________   EKG  I, Phineas Semen, attending physician, personally viewed and interpreted this EKG  EKG Time: 1357 Rate: 100 Rhythm: sinus tachycardia Axis: normal Intervals: qtc 474 QRS: narrow ST changes: no st elevation Impression: sinus tachycardia ____________________________________________    RADIOLOGY  CXR IMPRESSION: Mild right basilar scarring.  Increased right peritracheal density likely related to patient rotation to the right. Dedicated frontal film is recommended when the patient's condition improves.   ____________________________________________   PROCEDURES  Procedure(s) performed: None  Critical Care performed: No  ____________________________________________   INITIAL IMPRESSION / ASSESSMENT AND PLAN / ED COURSE  Pertinent labs & imaging results that were available during my care of the patient were reviewed by me and considered in my medical decision making (see chart for details).  Patient presented to the emergency department today because of concerns for shortness of breath. Patient felt better after 2 DuoNeb's and Solu-Medrol. Patient asked to be discharged. I did discuss with patient return precautions. Will discharge with prednisone  prescription. Patient states she has plenty of albuterol home. Discussed return precautions  ____________________________________________   FINAL CLINICAL IMPRESSION(S) / ED DIAGNOSES  Final diagnoses:  COPD exacerbation (HCC)     Phineas Semen, MD 03/31/15 [phone]

## 2015-04-05 ENCOUNTER — Emergency Department: Payer: Medicare Other

## 2015-04-05 ENCOUNTER — Encounter: Payer: Self-pay | Admitting: Emergency Medicine

## 2015-04-05 ENCOUNTER — Inpatient Hospital Stay
Admission: EM | Admit: 2015-04-05 | Discharge: 2015-04-07 | DRG: 191 | Disposition: A | Discharge status: Home / Self Care | Payer: Medicare Other | Attending: Internal Medicine | Admitting: Internal Medicine

## 2015-04-05 DIAGNOSIS — Z79899 Other long term (current) drug therapy: Secondary | ICD-10-CM

## 2015-04-05 DIAGNOSIS — I110 Hypertensive heart disease with heart failure: Secondary | ICD-10-CM | POA: Diagnosis present

## 2015-04-05 DIAGNOSIS — Z885 Allergy status to narcotic agent status: Secondary | ICD-10-CM

## 2015-04-05 DIAGNOSIS — Z882 Allergy status to sulfonamides status: Secondary | ICD-10-CM | POA: Diagnosis not present

## 2015-04-05 DIAGNOSIS — G2581 Restless legs syndrome: Secondary | ICD-10-CM | POA: Diagnosis present

## 2015-04-05 DIAGNOSIS — Z7982 Long term (current) use of aspirin: Secondary | ICD-10-CM

## 2015-04-05 DIAGNOSIS — Z87891 Personal history of nicotine dependence: Secondary | ICD-10-CM

## 2015-04-05 DIAGNOSIS — G8929 Other chronic pain: Secondary | ICD-10-CM | POA: Diagnosis present

## 2015-04-05 DIAGNOSIS — I252 Old myocardial infarction: Secondary | ICD-10-CM | POA: Diagnosis not present

## 2015-04-05 DIAGNOSIS — J441 Chronic obstructive pulmonary disease with (acute) exacerbation: Secondary | ICD-10-CM | POA: Diagnosis present

## 2015-04-05 DIAGNOSIS — I5032 Chronic diastolic (congestive) heart failure: Secondary | ICD-10-CM | POA: Diagnosis present

## 2015-04-05 DIAGNOSIS — Z7952 Long term (current) use of systemic steroids: Secondary | ICD-10-CM

## 2015-04-05 DIAGNOSIS — E876 Hypokalemia: Secondary | ICD-10-CM | POA: Diagnosis present

## 2015-04-05 DIAGNOSIS — E1149 Type 2 diabetes mellitus with other diabetic neurological complication: Secondary | ICD-10-CM | POA: Diagnosis present

## 2015-04-05 DIAGNOSIS — M549 Dorsalgia, unspecified: Secondary | ICD-10-CM | POA: Diagnosis present

## 2015-04-05 DIAGNOSIS — I2510 Atherosclerotic heart disease of native coronary artery without angina pectoris: Secondary | ICD-10-CM | POA: Diagnosis present

## 2015-04-05 DIAGNOSIS — E785 Hyperlipidemia, unspecified: Secondary | ICD-10-CM | POA: Diagnosis present

## 2015-04-05 DIAGNOSIS — Z951 Presence of aortocoronary bypass graft: Secondary | ICD-10-CM

## 2015-04-05 DIAGNOSIS — M810 Age-related osteoporosis without current pathological fracture: Secondary | ICD-10-CM | POA: Diagnosis present

## 2015-04-05 DIAGNOSIS — Z881 Allergy status to other antibiotic agents status: Secondary | ICD-10-CM | POA: Diagnosis not present

## 2015-04-05 DIAGNOSIS — Z888 Allergy status to other drugs, medicaments and biological substances status: Secondary | ICD-10-CM

## 2015-04-05 DIAGNOSIS — Z880 Allergy status to penicillin: Secondary | ICD-10-CM

## 2015-04-05 DIAGNOSIS — M069 Rheumatoid arthritis, unspecified: Secondary | ICD-10-CM | POA: Diagnosis present

## 2015-04-05 LAB — LACTIC ACID, PLASMA
LACTIC ACID, VENOUS: 2.8 mmol/L — AB (ref 0.5–2.0)
Lactic Acid, Venous: 2.5 mmol/L (ref 0.5–2.0)

## 2015-04-05 LAB — COMPREHENSIVE METABOLIC PANEL
ALK PHOS: 54 U/L (ref 38–126)
ALT: 16 U/L (ref 14–54)
AST: 17 U/L (ref 15–41)
Albumin: 3.8 g/dL (ref 3.5–5.0)
Anion gap: 9 (ref 5–15)
BILIRUBIN TOTAL: 0.5 mg/dL (ref 0.3–1.2)
BUN: 35 mg/dL — AB (ref 6–20)
CALCIUM: 9.1 mg/dL (ref 8.9–10.3)
CO2: 30 mmol/L (ref 22–32)
CREATININE: 0.89 mg/dL (ref 0.44–1.00)
Chloride: 95 mmol/L — ABNORMAL LOW (ref 101–111)
GFR calc Af Amer: >60 mL/min (ref >60)
GFR calc non Af Amer: 59 mL/min — ABNORMAL LOW (ref >60)
GLUCOSE: 272 mg/dL — AB (ref 65–99)
POTASSIUM: 3.4 mmol/L — AB (ref 3.5–5.1)
Sodium: 134 mmol/L — ABNORMAL LOW (ref 135–145)
Total Protein: 7.2 g/dL (ref 6.5–8.1)

## 2015-04-05 LAB — BRAIN NATRIURETIC PEPTIDE: B Natriuretic Peptide: 159 pg/mL — ABNORMAL HIGH (ref 0.0–100.0)

## 2015-04-05 LAB — CBC WITH DIFFERENTIAL/PLATELET
BASOS ABS: 0.1 10*3/uL (ref 0–0.1)
Basophils Relative: 1 %
Eosinophils Absolute: 0 10*3/uL (ref 0–0.7)
Eosinophils Relative: 0 %
HEMATOCRIT: 42.9 % (ref 35.0–47.0)
Hemoglobin: 14.1 g/dL (ref 12.0–16.0)
LYMPHS PCT: 8 %
Lymphs Abs: 1.2 10*3/uL (ref 1.0–3.6)
MCH: 29 pg (ref 26.0–34.0)
MCHC: 32.9 g/dL (ref 32.0–36.0)
MCV: 88.2 fL (ref 80.0–100.0)
MONO ABS: 1.2 10*3/uL — AB (ref 0.2–0.9)
Monocytes Relative: 8 %
NEUTROS ABS: 11.9 10*3/uL — AB (ref 1.4–6.5)
Neutrophils Relative %: 83 %
Platelets: 303 10*3/uL (ref 150–440)
RBC: 4.86 MIL/uL (ref 3.80–5.20)
RDW: 14.1 % (ref 11.5–14.5)
WBC: 14.4 10*3/uL — ABNORMAL HIGH (ref 3.6–11.0)

## 2015-04-05 LAB — URINALYSIS COMPLETE WITH MICROSCOPIC (ARMC ONLY)
Bilirubin Urine: NEGATIVE
GLUCOSE, UA: 150 mg/dL — AB
HGB URINE DIPSTICK: NEGATIVE
Ketones, ur: NEGATIVE mg/dL
LEUKOCYTES UA: NEGATIVE
Nitrite: POSITIVE — AB
PH: 5 (ref 5.0–8.0)
Specific Gravity, Urine: 1.026 (ref 1.005–1.030)

## 2015-04-05 LAB — TROPONIN I: Troponin I: <0.03 ng/mL (ref <0.031)

## 2015-04-05 MED ORDER — IPRATROPIUM-ALBUTEROL 0.5-2.5 (3) MG/3ML IN SOLN
3.0000 mL | Freq: Once | RESPIRATORY_TRACT | Status: AC
  Administered 2015-04-05: 3 mL via RESPIRATORY_TRACT

## 2015-04-05 MED ORDER — HEPARIN SODIUM (PORCINE) 5000 UNIT/ML IJ SOLN
5000.0000 [IU] | Freq: Three times a day (TID) | INTRAMUSCULAR | Status: DC
  Administered 2015-04-06 – 2015-04-07 (×5): 5000 [IU] via SUBCUTANEOUS
  Filled 2015-04-05 (×5): qty 1

## 2015-04-05 MED ORDER — OXYCODONE HCL 5 MG PO TABS: 5.0000 mg | ORAL_TABLET | ORAL | Status: DC | PRN

## 2015-04-05 MED ORDER — ONDANSETRON HCL 4 MG PO TABS
4.0000 mg | ORAL_TABLET | Freq: Four times a day (QID) | ORAL | Status: DC | PRN
Start: 2015-04-05 — End: 2015-04-07

## 2015-04-05 MED ORDER — HYDRALAZINE HCL 20 MG/ML IJ SOLN
10.0000 mg | INTRAMUSCULAR | Status: DC | PRN
Start: 2015-04-05 — End: 2015-04-07

## 2015-04-05 MED ORDER — ACETAMINOPHEN 650 MG RE SUPP: 650.0000 mg | Freq: Four times a day (QID) | RECTAL | Status: DC | PRN

## 2015-04-05 MED ORDER — ACETAMINOPHEN 325 MG PO TABS: 650.0000 mg | ORAL_TABLET | Freq: Four times a day (QID) | ORAL | Status: DC | PRN

## 2015-04-05 MED ORDER — IPRATROPIUM-ALBUTEROL 0.5-2.5 (3) MG/3ML IN SOLN
RESPIRATORY_TRACT | Status: AC
Start: 2015-04-05 — End: 2015-04-05
  Administered 2015-04-05: 18:00:00
  Filled 2015-04-05: qty 3

## 2015-04-05 MED ORDER — METHYLPREDNISOLONE SODIUM SUCC 125 MG IJ SOLR
125.0000 mg | Freq: Once | INTRAMUSCULAR | Status: AC
  Administered 2015-04-05: 125 mg via INTRAVENOUS
  Filled 2015-04-05: qty 2

## 2015-04-05 MED ORDER — IPRATROPIUM-ALBUTEROL 0.5-2.5 (3) MG/3ML IN SOLN
RESPIRATORY_TRACT | Status: AC
Start: 2015-04-05 — End: 2015-04-05
  Administered 2015-04-05: 3 mL via RESPIRATORY_TRACT
  Filled 2015-04-05: qty 6

## 2015-04-05 MED ORDER — LEVOFLOXACIN IN D5W 500 MG/100ML IV SOLN
500.0000 mg | INTRAVENOUS | Status: DC
  Administered 2015-04-06 (×2): 500 mg via INTRAVENOUS
  Filled 2015-04-05 (×3): qty 100

## 2015-04-05 MED ORDER — ONDANSETRON HCL 4 MG/2ML IJ SOLN: 4.0000 mg | Freq: Four times a day (QID) | INTRAMUSCULAR | Status: DC | PRN

## 2015-04-05 MED ORDER — POTASSIUM CHLORIDE CRYS ER 20 MEQ PO TBCR
40.0000 meq | EXTENDED_RELEASE_TABLET | Freq: Once | ORAL | Status: AC
Start: 2015-04-05 — End: 2015-04-06
  Administered 2015-04-06: 40 meq via ORAL
  Filled 2015-04-05: qty 2

## 2015-04-05 MED ORDER — LEVOFLOXACIN 500 MG PO TABS: 250.0000 mg | ORAL_TABLET | Freq: Every day | ORAL | Status: DC

## 2015-04-05 NOTE — ED Notes (Addendum)
Ambulated with cane 200 feet.  Patient tolerated well.  Dyspnea on exertion evident by end of walk.  RA sat after walk 93%  RR: 33

## 2015-04-05 NOTE — ED Notes (Signed)
Ambulated in room on Room air.  HR:  118  Sat on RA 87

## 2015-04-05 NOTE — H&P (Signed)
[facility] [place] at [facility]   PATIENT NAME: [name]    MR#:  [id number]  DATE OF BIRTH:  March 07, 1932   DATE OF ADMISSION:  04/05/2015  PRIMARY CARE PHYSICIAN: Hannah Beat, MD   REQUESTING/REFERRING PHYSICIAN: Malinda  CHIEF COMPLAINT:   Chief Complaint  Patient presents with  . Shortness of Breath    HISTORY OF PRESENT ILLNESS:  [name]  is a 82 y.o. female with a known history of COPD who is presenting with shortness of breath. She states that she's been having progressive symptoms for a few [place]. Described mainly as congestion, productive cough yellowish sputum, shortness of breath with wheezing she's been admitted to both [facility] and [facility] with intermittent improvement each time however, now she is presenting with worsening symptoms.  PAST MEDICAL HISTORY:   Past Medical History  Diagnosis Date  . Hypertension 08/01/1995  . Rheumatoid arthritis(714.0) 04/03/1983  . Osteoarthritis 04/03/1983  . Anemia, deficiency 07/01/2004    (w/u by Dr. Lorre Nick)  . Diabetes mellitus type II 07/31/1997  . Osteoporosis 12/31/2000  . CAD (coronary artery disease) 09/01/2002    a. s/p NSTEMI 2012. b. s/p CABG 01/2012.  [name] Hyperlipemia 07/02/1999  . COPD (chronic obstructive pulmonary disease) (HCC) 05/31/2004  . diverticulosis of colon   . Arteriovenous malformation of gastrointestinal tract   . Fatty liver   . Chronic back pain   . Splenic artery aneurysm (HCC)   . Anxiety and depression   . GI bleed     a. 04/2011 felt diverticular requiring microembolization by vascular.  . Adrenal mass (HCC)     a. Excision 2007.  [name] Restless leg syndrome   . C. difficile colitis     a. 04/2008 = severe.  [name] Hx of CABG 04/18/2012  . Diastolic CHF (HCC)     a. 02/2012: resp failure with COPD exac/CHF/pleural eff.;  b.  Echocardiogram 08/29/12: EF 55-60%, mild LVH, mild LAE, impaired LV relaxation  . PAF (paroxysmal  atrial fibrillation) (HCC) 12/16/2013  . 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 Chronic back pain greater than 3 months duration 11/30/2014    PAST SURGICAL HISTORY:   Past Surgical History  Procedure Laterality Date  . Nasal sinus surgery    . Inguinal hernia repair    . Partial hysterectomy      ovaries intact, dysmennorhea w/ A/P repari  . Carpal tunnel release      bilat  . Trigger finger release      right  . Wrist surgery      neuroma repair, right  . Ankle surgery      neuroma repair, right ORIF  . Total shoulder replacement  12/13/1998    right, ([facility])  . Rotator cuff repair  11/22/2003    ([facility]) left  . Epidural block injection  05/2005    x 3  . Long finger tendon realignment  10/31/2005    Meyerdierks  . Lumbar epidural  06/13/2006  . Laminectomy  06/2006    L4/5 spinal stenosis (Dr. [name])  . Laminectomy  02/24/2008    L3/4 Ant/Lat interbody fusion and Lat Artthrodesis w/plate using XLIF (Dr. [name])  . Colonic embolization  04/18/2011    Dr. Wyn Quaker, acute GI bleed  . Spinal cord stimulator implant    . Coronary artery bypass graft  01/22/2012    Procedure: CORONARY ARTERY BYPASS GRAFTING (CABG);  Surgeon: Delight Ovens, MD;  Location: [facility] OR;  Service: Open Heart Surgery;  Laterality: N/A;  Coronary Artery Bypass Graft times four utilizing the left internal mammary artery and the left greater saphenous vein harvested endoscopically.  Rhae Hammock without cardioversion  01/22/2012    Procedure: TRANSESOPHAGEAL ECHOCARDIOGRAM (TEE);  Surgeon: Delight Ovens, MD;  Location: [facility] OR;  Service: Open Heart Surgery;  Laterality: N/A;    SOCIAL HISTORY:   Social History  Substance Use Topics  . Smoking status: Former Smoker -- 0.10 packs/day for 50 years    Types: Cigarettes    Quit date: 12/18/2014  . Smokeless tobacco: Former Neurosurgeon  . Alcohol Use: 0.0 oz/week    0 Standard drinks or equivalent per week     Comment: red wine occassionally     FAMILY HISTORY:   Family History  Problem Relation Age of Onset  . Hypotension Mother   . Dementia Mother   . Alcohol abuse Father   . Heart disease Father     MI, enlarged heart  . Diabetes Brother     DRUG ALLERGIES:   Allergies  Allergen Reactions  . Other Diarrhea, Rash and Other (See Comments)    Uncoded Allergy. Allergen: mycins  Reaction: Sore tongue, raw mouth, weakness  . Penicillins Other (See Comments)    Other Reaction: NEAR SYNCOPE  . Sulfa Antibiotics Shortness Of Breath  . Morphine Other (See Comments)    Other Reaction: GI UPSET  . Morphine Sulfate Nausea And Vomiting    At high doses hallucinations Can take in low doses  . Macrolides And Ketolides Diarrhea and Nausea And Vomiting    Only causes GI distress at extremely high doses per patient  . Iron Diarrhea  . Naproxen Rash    REVIEW OF SYSTEMS:  REVIEW OF SYSTEMS:  CONSTITUTIONAL: Denies fevers, chills, fatigue, weakness.  EYES: Denies blurred vision, double vision, or eye pain.  EARS, NOSE, THROAT: Denies tinnitus, ear pain, hearing loss.  RESPIRATORY: Positive cough, shortness of breath, wheezing  CARDIOVASCULAR: Denies chest pain, palpitations, edema.  GASTROINTESTINAL: Denies nausea, vomiting, diarrhea, abdominal pain.  GENITOURINARY: Denies dysuria, hematuria.  ENDOCRINE: Denies nocturia or thyroid problems. HEMATOLOGIC AND LYMPHATIC: Denies easy bruising or bleeding.  SKIN: Denies rash or lesions.  MUSCULOSKELETAL: Denies pain in neck, back, shoulder, knees, hips, or further arthritic symptoms.  NEUROLOGIC: Denies paralysis, paresthesias.  PSYCHIATRIC: Denies anxiety or depressive symptoms. Otherwise full review of systems performed by me is negative.   MEDICATIONS AT HOME:   Prior to Admission medications   Medication Sig Start Date End Date Taking? Authorizing Provider  albuterol (PROAIR HFA) 108 (90 BASE) MCG/ACT inhaler Inhale 2 puffs into the lungs 4 (four) times daily.  06/17/14  Yes Spencer Copland, MD  albuterol (PROVENTIL) (5 MG/ML) 0.5% nebulizer solution Take 0.5 mLs (2.5 mg total) by nebulization every 6 (six) hours as needed for wheezing. 06/17/14  Yes Hannah Beat, MD  aspirin EC 81 MG tablet Take 1 tablet (81 mg total) by mouth daily. 08/13/13  Yes [name], MD  atorvastatin (LIPITOR) 40 MG tablet TAKE ONE TABLET BY MOUTH EVERY DAY 10/26/14  Yes [name], MD  Docusate Calcium (STOOL SOFTENER PO) Take 3 tablets by mouth daily. Reported on 04/05/2015   Yes Historical Provider, MD  Fluticasone-Salmeterol (ADVAIR DISKUS) 500-50 MCG/DOSE AEPB Inhale 1 puff into the lungs 2 (two) times daily. 09/27/14  Yes Hannah Beat, MD  Melatonin 5 MG TABS Take 1 tablet by mouth at bedtime.   Yes Historical Provider, MD  metoprolol tartrate (LOPRESSOR)  25 MG tablet Take 25 mg by mouth 2 (two) times daily.   Yes Historical Provider, MD  morphine (MS CONTIN) 15 MG 12 hr tablet Take 15 mg by mouth every 12 (twelve) hours.   Yes Historical Provider, MD  nitroGLYCERIN (NITROSTAT) 0.4 MG SL tablet Place 1 tablet (0.4 mg total) under the tongue every 5 (five) minutes as needed for chest pain. Max 3 per day 09/07/14  Yes Rosalio Macadamia, NP  OxyCODONE HCl ER 30 MG T12A Take 1 tablet by mouth 2 (two) times daily.   Yes Historical Provider, MD  predniSONE (DELTASONE) 20 MG tablet Take 2 tablets (40 mg total) by mouth daily. 03/31/15  Yes Phineas Semen, MD  ramipril (ALTACE) 5 MG capsule Take 1 capsule (5 mg total) by mouth 2 (two) times daily. 01/19/15  Yes Rosalio Macadamia, NP  tiotropium (SPIRIVA) 18 MCG inhalation capsule Place 1 capsule (18 mcg total) into inhaler and inhale daily. 09/27/14  Yes Spencer Copland, MD  tiZANidine (ZANAFLEX) 4 MG tablet Take 0.5-1 tablets (2-4 mg total) by mouth Nightly. 03/14/15  Yes Hannah Beat, MD  Omega-3 Fatty Acids (FISH OIL) 1000 MG CAPS Take 1 capsule by mouth daily.    Historical Provider, MD  Probiotic Product (PHILLIPS COLON  HEALTH PO) Take 1 capsule by mouth daily.    Historical Provider, MD  VITAMIN E PO Take by mouth.    Historical Provider, MD      VITAL SIGNS:  Blood pressure 182/90, pulse 101, temperature 97.9 F (36.6 C), temperature source Oral, resp. rate 20, height 5' (1.524 m), weight 135 lb (61.236 kg), SpO2 92 %.  PHYSICAL EXAMINATION:  VITAL SIGNS: Filed Vitals:   04/05/15 2126 04/05/15 2303  BP: 187/95 182/90  Pulse: 118 101  Temp:    Resp: 22 20   GENERAL:83 y.o.female currently in no acute distress.  HEAD: Normocephalic, atraumatic.  EYES: Pupils equal, round, reactive to light. Extraocular muscles intact. No scleral icterus.  MOUTH: Moist mucosal membrane. Dentition intact. No abscess noted.  EAR, NOSE, THROAT: Clear without exudates. No external lesions.  NECK: Supple. No thyromegaly. No nodules. No JVD.  PULMONARY: Scant expiratory wheeze with grossly diminished breath sounds throughout all lung fields No use of accessory muscles, Good respiratory effort. Poor air entry bilaterally CHEST: Nontender to palpation.  CARDIOVASCULAR: S1 and S2. Regular rate and rhythm. No murmurs, rubs, or gallops. No edema. Pedal pulses 2+ bilaterally.  GASTROINTESTINAL: Soft, nontender, nondistended. No masses. Positive bowel sounds. No hepatosplenomegaly.  MUSCULOSKELETAL: No swelling, clubbing, or edema. Range of motion full in all extremities.  NEUROLOGIC: Cranial nerves II through XII are intact. No gross focal neurological deficits. Sensation intact. Reflexes intact.  SKIN: No ulceration, lesions, rashes, or cyanosis. Skin warm and dry. Turgor intact.  PSYCHIATRIC: Mood, affect within normal limits. The patient is awake, alert and oriented x 3. Insight, judgment intact.    LABORATORY PANEL:   CBC  Recent Labs Lab 04/05/15 1734  WBC 14.4*  HGB 14.1  HCT 42.9  PLT 303    ------------------------------------------------------------------------------------------------------------------  Chemistries   Recent Labs Lab 04/05/15 1734  NA 134*  K 3.4*  CL 95*  CO2 30  GLUCOSE 272*  BUN 35*  CREATININE 0.89  CALCIUM 9.1  AST 17  ALT 16  ALKPHOS 54  BILITOT 0.5   ------------------------------------------------------------------------------------------------------------------  Cardiac Enzymes  Recent Labs Lab 04/05/15 1734  TROPONINI <0.03   ------------------------------------------------------------------------------------------------------------------  RADIOLOGY:  Dg Chest Portable 1 View  04/05/2015  CLINICAL DATA:  Shortness of Breath EXAM: PORTABLE CHEST 1 VIEW COMPARISON:  03/31/2015 FINDINGS: Cardiomediastinal silhouette is stable. Spinal stimulator wires mid thoracic spine again noted. There is chronic elevation of the right hemidiaphragm. Stable right basilar atelectasis or scarring. No acute infiltrate or pulmonary edema. Bilateral shoulder prosthesis again noted. IMPRESSION: No active disease. Again noted elevation of the right hemidiaphragm with and right basilar scarring. Bilateral shoulder prosthesis. Electronically Signed   By: Natasha Mead M.D.   On: 04/05/2015 18:04    EKG:   Orders placed or performed during the hospital encounter of 04/05/15  . ED EKG  . ED EKG  . EKG 12-Lead  . EKG 12-Lead  . EKG 12-Lead  . EKG 12-Lead    IMPRESSION AND PLAN:   82 year old Caucasian female history of COPD presenting with shortness breath  1.Chronic obstructive pulmonary disease exacerbation: Provide DuoNeb treatments q. 4 hours, Solu-Medrol 60 mg IV q. daily, add Levaquin given allergies to azithromycin. Continue with home medications.  2. Hyperlipidemia unspecified statin therapy 3. Hypokalemia: Replace potassium, 4-5 4. Venous thromboembolism prophylactic: Heparin subcutaneous      All the records are reviewed and case  discussed with ED provider. Management plans discussed with the patient, family and they are in agreement.  CODE STATUS: Full  TOTAL TIME TAKING CARE OF THIS PATIENT: 35 minutes.    Hower,  Mardi Mainland.D on 04/05/2015 at 11:21 PM  Between 7am to 6pm - Pager - [phone]  After 6pm: House Pager: - [phone]  [facility]  Office  [phone]  CC: Primary care physician; Hannah Beat, MD

## 2015-04-05 NOTE — ED Notes (Signed)
Oxygen placed on patient 2L/ [place]. 

## 2015-04-05 NOTE — ED Provider Notes (Signed)
[facility] Emergency Department Provider Note  ____________________________________________  Time seen: Approximately 5:44 PM  I have reviewed the triage vital signs and the nursing notes.   HISTORY  Chief Complaint Shortness of Breath    HPI [name] is a 82 y.o. female Patient has been short of breath for some time. She had a visit here on the 29th. She then went to [facility] and spent about 3 days in the hospital. She left early. She went home. When she walks around home however she gets very short of breath and just can't make it. She has not had any fever. She has a cough productive of clear phlegm. She really doesn't have any chest pain.   Past Medical History  Diagnosis Date  . Hypertension 08/01/1995  . Rheumatoid arthritis(714.0) 04/03/1983  . Osteoarthritis 04/03/1983  . Anemia, deficiency 07/01/2004    (w/u by Dr. Lorre Nick)  . Diabetes mellitus type II 07/31/1997  . Osteoporosis 12/31/2000  . CAD (coronary artery disease) 09/01/2002    a. s/p NSTEMI 2012. b. s/p CABG 01/2012.  [name] Hyperlipemia 07/02/1999  . COPD (chronic obstructive pulmonary disease) (HCC) 05/31/2004  . diverticulosis of colon   . Arteriovenous malformation of gastrointestinal tract   . Fatty liver   . Chronic back pain   . Splenic artery aneurysm (HCC)   . Anxiety and depression   . GI bleed     a. 04/2011 felt diverticular requiring microembolization by vascular.  . Adrenal mass (HCC)     a. Excision 2007.  [name] Restless leg syndrome   . C. difficile colitis     a. 04/2008 = severe.  [name] Hx of CABG 04/18/2012  . Diastolic CHF (HCC)     a. 02/2012: resp failure with COPD exac/CHF/pleural eff.;  b.  Echocardiogram 08/29/12: EF 55-60%, mild LVH, mild LAE, impaired LV relaxation  . PAF (paroxysmal atrial fibrillation) (HCC) 12/16/2013  . 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 Chronic back pain greater than 3 months duration 11/30/2014    Patient  Active Problem List   Diagnosis Date Noted  . Chronic back pain greater than 3 months duration 11/30/2014  . Pedal edema 08/31/2014  . Dyspnea 08/31/2014  . 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 PAF (paroxysmal atrial fibrillation) (HCC) 12/16/2013  . History of GI diverticular bleed 09/25/2012  . Depression 06/17/2012  . Hx of CABG 04/18/2012  . Acute on chronic diastolic CHF (congestive heart failure) (HCC) 02/05/2012  . Splenic artery aneurysm (HCC)   . History of non-ST elevation myocardial infarction (NSTEMI) 07/22/2011  . GIB (gastrointestinal bleeding) 04/25/2011  . Smoking 02/09/2011  . AAA (abdominal aortic aneurysm) (HCC) 12/22/2010  . DIVERTICULOSIS OF COLON 05/14/2008  . ANXIETY DEPRESSION 04/19/2008  . ECZEMA 07/21/2007  . ARTERIOVENOUS MALFORMATION, GASTRIC 11/08/2006  . INSOMNIA, CHRONIC 07/21/2006  . ANEMIA-IRON DEFICIENCY 07/01/2004  . 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 CAD (coronary artery disease) 09/01/2002  . FATTY LIVER DISEASE 07/16/2002  . OSTEOPOROSIS 12/31/2000  . HYPERLIPIDEMIA 07/02/1999  . 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 Essential hypertension, benign 08/01/1995  . Rheumatoid arthritis(714.0) 04/03/1983  . OSTEOARTHRITIS 04/03/1983    Past Surgical History  Procedure Laterality Date  . Nasal sinus surgery    . Inguinal hernia repair    . Partial hysterectomy      ovaries intact, dysmennorhea w/ A/P repari  . Carpal tunnel release      bilat  .  Trigger finger release      right  . Wrist surgery      neuroma repair, right  . Ankle surgery      neuroma repair, right ORIF  . Total shoulder replacement  12/13/1998    right, ([place])  . Rotator cuff repair  11/22/2003    ([place]) left  . Epidural block injection  05/2005    x 3  . Long finger tendon realignment  10/31/2005    [place]  . Lumbar epidural  06/13/2006  . Laminectomy  06/2006    L4/5 spinal  stenosis (Dr. [name])  . Laminectomy  02/24/2008    L3/4 Ant/Lat interbody fusion and Lat Artthrodesis w/plate using XLIF (Dr. [name])  . Colonic embolization  04/18/2011    Dr. Wyn Quaker, acute GI bleed  . Spinal cord stimulator implant    . Coronary artery bypass graft  01/22/2012    Procedure: CORONARY ARTERY BYPASS GRAFTING (CABG);  Surgeon: Delight Ovens, MD;  Location: [facility] OR;  Service: Open Heart Surgery;  Laterality: N/A;  Coronary Artery Bypass Graft times four utilizing the left internal mammary artery and the left greater saphenous vein harvested endoscopically.  Rhae Hammock without cardioversion  01/22/2012    Procedure: TRANSESOPHAGEAL ECHOCARDIOGRAM (TEE);  Surgeon: Delight Ovens, MD;  Location: [facility] OR;  Service: Open Heart Surgery;  Laterality: N/A;    Current Outpatient Rx  Name  Route  Sig  Dispense  Refill  . albuterol (PROAIR HFA) 108 (90 BASE) MCG/ACT inhaler   Inhalation   Inhale 2 puffs into the lungs 4 (four) times daily.   18 g   11   . albuterol (PROVENTIL) (5 MG/ML) 0.5% nebulizer solution   Nebulization   Take 0.5 mLs (2.5 mg total) by nebulization every 6 (six) hours as needed for wheezing.   20 mL   12   . aspirin EC 81 MG tablet   Oral   Take 1 tablet (81 mg total) by mouth daily.   90 tablet   3   . atorvastatin (LIPITOR) 40 MG tablet      TAKE ONE TABLET BY MOUTH EVERY DAY   30 tablet   3   . Docusate Calcium (STOOL SOFTENER PO)   Oral   Take 3 tablets by mouth daily. Reported on 04/05/2015         . Fluticasone-Salmeterol (ADVAIR DISKUS) 500-50 MCG/DOSE AEPB   Inhalation   Inhale 1 puff into the lungs 2 (two) times daily.   60 each   11   . Melatonin 5 MG TABS   Oral   Take 1 tablet by mouth at bedtime.         . metoprolol tartrate (LOPRESSOR) 25 MG tablet   Oral   Take 25 mg by mouth 2 (two) times daily.         [name] morphine (MS CONTIN) 15 MG 12 hr tablet   Oral   Take 15 mg by mouth every 12 (twelve) hours.         . nitroGLYCERIN  (NITROSTAT) 0.4 MG SL tablet   Sublingual   Place 1 tablet (0.4 mg total) under the tongue every 5 (five) minutes as needed for chest pain. Max 3 per day   25 tablet   2   . OxyCODONE HCl ER 30 MG T12A   Oral   Take 1 tablet by mouth 2 (two) times daily.         . predniSONE (DELTASONE) 20 MG tablet  Oral   Take 2 tablets (40 mg total) by mouth daily.   8 tablet   0   . ramipril (ALTACE) 5 MG capsule   Oral   Take 1 capsule (5 mg total) by mouth 2 (two) times daily.   180 capsule   3   . tiotropium (SPIRIVA) 18 MCG inhalation capsule   Inhalation   Place 1 capsule (18 mcg total) into inhaler and inhale daily.   30 capsule   12   . tiZANidine (ZANAFLEX) 4 MG tablet   Oral   Take 0.5-1 tablets (2-4 mg total) by mouth Nightly.   30 tablet   5   . Omega-3 Fatty Acids (FISH OIL) 1000 MG CAPS   Oral   Take 1 capsule by mouth daily.         . Probiotic Product (PHILLIPS COLON HEALTH PO)   Oral   Take 1 capsule by mouth daily.         [name] VITAMIN E PO   Oral   Take by mouth.           Allergies Other; Penicillins; Sulfa antibiotics; Morphine; Morphine sulfate; Macrolides and ketolides; Iron; and Naproxen  Family History  Problem Relation Age of Onset  . Hypotension Mother   . Dementia Mother   . Alcohol abuse Father   . Heart disease Father     MI, enlarged heart  . Diabetes Brother     Social History Social History  Substance Use Topics  . Smoking status: Former Smoker -- 0.10 packs/day for 50 years    Types: Cigarettes    Quit date: 12/18/2014  . Smokeless tobacco: Former Neurosurgeon  . Alcohol Use: 0.0 oz/week    0 Standard drinks or equivalent per week     Comment: red wine occassionally    Review of Systems Constitutional: No fever/chills Eyes: No visual changes. ENT: No sore throat. Cardiovascular: Denies chest pain. Respiratory:see history of present illness Gastrointestinal: No abdominal pain.  No nausea, no vomiting.  No diarrhea.  No  constipation. Genitourinary: Negative for dysuria. Musculoskeletal: Negative for back pain. Skin: Negative for rash. Neurological: Negative for headaches, focal weakness or numbness.  10-point ROS otherwise negative.  ____________________________________________   PHYSICAL EXAM:  VITAL SIGNS: ED Triage Vitals  Enc Vitals Group     BP 04/05/15 1720 157/81 mmHg     Pulse Rate 04/05/15 1720 104     Resp 04/05/15 1720 20     Temp 04/05/15 1720 97.9 F (36.6 C)     Temp Source 04/05/15 1720 Oral     SpO2 04/05/15 1720 91 %     Weight 04/05/15 1720 135 lb (61.236 kg)     Height 04/05/15 1720 5' (1.524 m)     Head Cir --      Peak Flow --      Pain Score 04/05/15 1725 0     Pain Loc --      Pain Edu? --      Excl. in GC? --     Constitutional: Alert and oriented. Well appearing and in no acute distress. Eyes: Conjunctivae are normal. PERRL. EOMI. Head: Atraumatic. Nose: No congestion/rhinnorhea. Mouth/Throat: Mucous membranes are moist.  Oropharynx non-erythematous. Neck: No stridor.   Cardiovascular: Normal rate, regular rhythm. Grossly normal heart sounds.  Good peripheral circulation. Respiratory: Normal respiratory effort.  No retractions. Lungs scattered wheezes Gastrointestinal: Soft and nontender. No distention. No abdominal bruits. No CVA tenderness. Musculoskeletal: No lower extremity tenderness nor edema.  No joint effusions. Neurologic:  Normal speech and language. No gross focal neurologic deficits are appreciated. No gait instability. Skin:  Skin is warm, dry and intact. No rash noted. Psychiatric: Mood and affect are normal. Speech and behavior are normal.  ____________________________________________   LABS (all labs ordered are listed, but only abnormal results are displayed)  Labs Reviewed  COMPREHENSIVE METABOLIC PANEL - Abnormal; Notable for the following:    Sodium 134 (*)    Potassium 3.4 (*)    Chloride 95 (*)    Glucose, Bld 272 (*)    BUN 35  (*)    GFR calc non Af Amer 59 (*)    All other components within normal limits  BRAIN NATRIURETIC PEPTIDE - Abnormal; Notable for the following:    B Natriuretic Peptide 159.0 (*)    All other components within normal limits  LACTIC ACID, PLASMA - Abnormal; Notable for the following:    Lactic Acid, Venous 2.5 (*)    All other components within normal limits  LACTIC ACID, PLASMA - Abnormal; Notable for the following:    Lactic Acid, Venous 2.8 (*)    All other components within normal limits  CBC WITH DIFFERENTIAL/PLATELET - Abnormal; Notable for the following:    WBC 14.4 (*)    Neutro Abs 11.9 (*)    Monocytes Absolute 1.2 (*)    All other components within normal limits  URINALYSIS COMPLETEWITH MICROSCOPIC ([facility] ONLY) - Abnormal; Notable for the following:    Color, Urine AMBER (*)    APPearance CLEAR (*)    Glucose, UA 150 (*)    Protein, ur >500 (*)    Nitrite POSITIVE (*)    Bacteria, UA MANY (*)    Squamous Epithelial / LPF 0-5 (*)    All other components within normal limits  TROPONIN I   ____________________________________________  EKG EKG read and interpreted by me shows normal sinus rhythm at a rate of 95 left axis is normal PVCs question of slight ST segment depression in 1 and L. Patient has absolutely no cardiac symptoms.  EKG #2 read and interpreted by me shows sinus tach at a rate of 112 left axis again minimal ST segment depression present in some of the beats and lead 1 and lead L____________________________________________  RADIOLOGY Chest x-ray read by radiology as no acute disease ____________________________________________   PROCEDURES  Patient is had 3 DuoNeb's but still after the slight exertion drops her oxygen saturation down to 86 and remains low for very long tim.  ____________________________________________   INITIAL IMPRESSION / ASSESSMENT AND PLAN / ED COURSE  Pertinent labs & imaging results that were available during my care of  the patient were reviewed by me and considered in my medical decision making (see chart for details).   ____________________________________________   FINAL CLINICAL IMPRESSION(S) / ED DIAGNOSES  Final diagnoses:  COPD exacerbation (HCC)      Arnaldo Natal, MD 04/05/15 2132

## 2015-04-05 NOTE — ED Notes (Signed)
Breathing difficulty.  REcently seen at [facility] and also seen and hospitalized at [facility] for 3 days.  Discharged on Monday.  Today c/o DOE.  Patient wears 2L home O2.  Finishing prednisone course from [facility] and taking duoneb and albuterol nebs at home.

## 2015-04-06 ENCOUNTER — Ambulatory Visit: Payer: Medicare Other | Admitting: Family Medicine

## 2015-04-06 LAB — CBC
HCT: 39.7 % (ref 35.0–47.0)
Hemoglobin: 12.9 g/dL (ref 12.0–16.0)
MCH: 28.7 pg (ref 26.0–34.0)
MCHC: 32.5 g/dL (ref 32.0–36.0)
MCV: 88.2 fL (ref 80.0–100.0)
PLATELETS: 274 10*3/uL (ref 150–440)
RBC: 4.5 MIL/uL (ref 3.80–5.20)
RDW: 14.4 % (ref 11.5–14.5)
WBC: 12.4 10*3/uL — ABNORMAL HIGH (ref 3.6–11.0)

## 2015-04-06 LAB — CREATININE, SERUM
CREATININE: 0.86 mg/dL (ref 0.44–1.00)
GFR calc Af Amer: >60 mL/min (ref >60)

## 2015-04-06 MED ORDER — OXYCODONE HCL ER 30 MG PO T12A: 1.0000 | EXTENDED_RELEASE_TABLET | Freq: Two times a day (BID) | ORAL | Status: DC

## 2015-04-06 MED ORDER — METHYLPREDNISOLONE SODIUM SUCC 40 MG IJ SOLR
40.0000 mg | Freq: Two times a day (BID) | INTRAMUSCULAR | Status: DC
  Administered 2015-04-06 – 2015-04-07 (×2): 40 mg via INTRAVENOUS
  Filled 2015-04-06 (×2): qty 1

## 2015-04-06 MED ORDER — RAMIPRIL 5 MG PO CAPS
5.0000 mg | ORAL_CAPSULE | Freq: Two times a day (BID) | ORAL | Status: DC
  Administered 2015-04-06 – 2015-04-07 (×3): 5 mg via ORAL
  Filled 2015-04-06 (×3): qty 1

## 2015-04-06 MED ORDER — TIZANIDINE HCL 4 MG PO TABS
2.0000 mg | ORAL_TABLET | Freq: Every evening | ORAL | Status: DC
  Administered 2015-04-06: 20:00:00 4 mg via ORAL
  Filled 2015-04-06: qty 1

## 2015-04-06 MED ORDER — DOCUSATE SODIUM 100 MG PO CAPS
100.0000 mg | ORAL_CAPSULE | Freq: Two times a day (BID) | ORAL | Status: DC
  Administered 2015-04-06 – 2015-04-07 (×3): 100 mg via ORAL
  Filled 2015-04-06 (×3): qty 1

## 2015-04-06 MED ORDER — OXYCODONE HCL 5 MG PO TABS
30.0000 mg | ORAL_TABLET | ORAL | Status: DC | PRN
  Administered 2015-04-06 – 2015-04-07 (×5): 30 mg via ORAL
  Filled 2015-04-06 (×5): qty 6

## 2015-04-06 MED ORDER — MOMETASONE FURO-FORMOTEROL FUM 200-5 MCG/ACT IN AERO
2.0000 | INHALATION_SPRAY | Freq: Two times a day (BID) | RESPIRATORY_TRACT | Status: DC
  Administered 2015-04-06 – 2015-04-07 (×3): 2 via RESPIRATORY_TRACT
  Filled 2015-04-06: qty 8.8

## 2015-04-06 MED ORDER — TIOTROPIUM BROMIDE MONOHYDRATE 18 MCG IN CAPS
18.0000 ug | ORAL_CAPSULE | Freq: Every day | RESPIRATORY_TRACT | Status: DC
  Administered 2015-04-06 – 2015-04-07 (×2): 18 ug via RESPIRATORY_TRACT
  Filled 2015-04-06: qty 5

## 2015-04-06 MED ORDER — METHYLPREDNISOLONE SODIUM SUCC 125 MG IJ SOLR: 60.0000 mg | INTRAMUSCULAR | Status: DC

## 2015-04-06 MED ORDER — ATORVASTATIN CALCIUM 20 MG PO TABS
40.0000 mg | ORAL_TABLET | Freq: Every day | ORAL | Status: DC
  Administered 2015-04-06 – 2015-04-07 (×2): 40 mg via ORAL
  Filled 2015-04-06 (×2): qty 2

## 2015-04-06 MED ORDER — CETYLPYRIDINIUM CHLORIDE 0.05 % MT LIQD
7.0000 mL | Freq: Two times a day (BID) | OROMUCOSAL | Status: DC
  Administered 2015-04-06 – 2015-04-07 (×2): 7 mL via OROMUCOSAL

## 2015-04-06 MED ORDER — MORPHINE SULFATE ER 30 MG PO TBCR
30.0000 mg | EXTENDED_RELEASE_TABLET | Freq: Every day | ORAL | Status: DC
  Administered 2015-04-06: 30 mg via ORAL
  Filled 2015-04-06: qty 1

## 2015-04-06 MED ORDER — METOPROLOL TARTRATE 25 MG PO TABS
25.0000 mg | ORAL_TABLET | Freq: Two times a day (BID) | ORAL | Status: DC
  Administered 2015-04-06 (×2): 25 mg via ORAL
  Filled 2015-04-06 (×2): qty 1

## 2015-04-06 MED ORDER — IPRATROPIUM-ALBUTEROL 0.5-2.5 (3) MG/3ML IN SOLN: 3.0000 mL | RESPIRATORY_TRACT | Status: DC | PRN

## 2015-04-06 MED ORDER — MELATONIN 5 MG PO TABS: 1.0000 | ORAL_TABLET | Freq: Every day | ORAL | Status: DC

## 2015-04-06 MED ORDER — ZOLPIDEM TARTRATE 5 MG PO TABS: 5.0000 mg | ORAL_TABLET | Freq: Every evening | ORAL | Status: DC | PRN

## 2015-04-06 MED ORDER — ASPIRIN EC 81 MG PO TBEC
81.0000 mg | DELAYED_RELEASE_TABLET | Freq: Every day | ORAL | Status: DC
  Administered 2015-04-06 – 2015-04-07 (×2): 81 mg via ORAL
  Filled 2015-04-06 (×2): qty 1

## 2015-04-06 NOTE — Progress Notes (Signed)
Patient ambulated around the unit x1 (approx. 146ft.) with O2, sats maintained at 96% the entire time. Pt reports she has home O2 with a portable tank.

## 2015-04-06 NOTE — Progress Notes (Signed)
Inpatient Diabetes Program Recommendations  AACE/ADA: New Consensus Statement on Inpatient Glycemic Control (2015)  Target Ranges:  Prepandial:   less than 140 mg/dL      Peak postprandial:   less than 180 mg/dL (1-2 hours)      Critically ill patients:  140 - 180 mg/dL   Review of Glycemic Control Pt has a history of diabetes Inpatient Diabetes Program Recommendations:  Correction (SSI): start Novolog moderate scale TID + HS per Glycemic Control order-set Thank you  Piedad Climes BSN, RN,CDE Inpatient Diabetes Coordinator [phone] (team pager)

## 2015-04-06 NOTE — Progress Notes (Signed)
Pharmacist - Physician Communication  Per [facility] policy diphenhydramine 25 mg po at bedtime PRN sleep has been changed to zolpidem 5 mg po at bedtime PRN sleep. If you have any questions about this change in therapy please call pharmacy.  Deaysia Grigoryan A. Preston, [place].D., BCPS Clinical Pharmacist 04/06/2015 [phone]

## 2015-04-06 NOTE — Progress Notes (Signed)
Admission skin assessment with Brooke Robertson, RN 

## 2015-04-06 NOTE — Progress Notes (Addendum)
[facility] - [place] at [facility]   PATIENT NAME: [name]    MR#:  [id number]  DATE OF BIRTH:  Oct 02, 1931  SUBJECTIVE:  CHIEF COMPLAINT:  Patient's shortness of breath is significantly improved. Feeling better.  REVIEW OF SYSTEMS:  CONSTITUTIONAL: No fever, fatigue or weakness.  EYES: No blurred or double vision.  EARS, NOSE, AND THROAT: No tinnitus or ear pain.  RESPIRATORY: No cough, improved shortness of breath, denies wheezing or hemoptysis.  CARDIOVASCULAR: No chest pain, orthopnea, edema.  GASTROINTESTINAL: No nausea, vomiting, diarrhea or abdominal pain.  GENITOURINARY: No dysuria, hematuria.  ENDOCRINE: No polyuria, nocturia,  HEMATOLOGY: No anemia, easy bruising or bleeding SKIN: No rash or lesion. MUSCULOSKELETAL: No joint pain or arthritis.   NEUROLOGIC: No tingling, numbness, weakness.  PSYCHIATRY: No anxiety or depression.   DRUG ALLERGIES:   Allergies  Allergen Reactions  . Other Diarrhea, Rash and Other (See Comments)    Uncoded Allergy. Allergen: mycins  Reaction: Sore tongue, raw mouth, weakness  . Penicillins Other (See Comments)    Other Reaction: NEAR SYNCOPE  . Sulfa Antibiotics Shortness Of Breath  . Morphine Other (See Comments)    Other Reaction: GI UPSET  . Morphine Sulfate Nausea And Vomiting    At high doses hallucinations Can take in low doses  . Macrolides And Ketolides Diarrhea and Nausea And Vomiting    Only causes GI distress at extremely high doses per patient  . Iron Diarrhea  . Naproxen Rash    VITALS:  Blood pressure 159/63, pulse 64, temperature 98.1 F (36.7 C), temperature source Oral, resp. rate 20, height 5' (1.524 m), weight 61.236 kg (135 lb), SpO2 94 %.  PHYSICAL EXAMINATION:  GENERAL:  82 y.o.-year-old patient lying in the bed with no acute distress.  EYES: Pupils equal, round, reactive to light and accommodation. No scleral icterus. Extraocular muscles intact.  HEENT: Head atraumatic,  normocephalic. Oropharynx and nasopharynx clear.  NECK:  Supple, no jugular venous distention. No thyroid enlargement, no tenderness.  LUNGS: Normal breath sounds bilaterally, minimal wheezing, rales,rhonchi or crepitation. No use of accessory muscles of respiration.  CARDIOVASCULAR: S1, S2 normal. No murmurs, rubs, or gallops.  ABDOMEN: Soft, nontender, nondistended. Bowel sounds present. No organomegaly or mass.  EXTREMITIES: No pedal edema, cyanosis, or clubbing.  NEUROLOGIC: Cranial nerves II through XII are intact. Muscle strength 5/5 in all extremities. Sensation intact. Gait not checked.  PSYCHIATRIC: The patient is alert and oriented x 3.  SKIN: No obvious rash, lesion, or ulcer.    LABORATORY PANEL:   CBC  Recent Labs Lab 04/06/15 0500  WBC 12.4*  HGB 12.9  HCT 39.7  PLT 274   ------------------------------------------------------------------------------------------------------------------  Chemistries   Recent Labs Lab 04/05/15 1734 04/06/15 0500  NA 134*  --   K 3.4*  --   CL 95*  --   CO2 30  --   GLUCOSE 272*  --   BUN 35*  --   CREATININE 0.89 0.86  CALCIUM 9.1  --   AST 17  --   ALT 16  --   ALKPHOS 54  --   BILITOT 0.5  --    ------------------------------------------------------------------------------------------------------------------  Cardiac Enzymes  Recent Labs Lab 04/05/15 1734  TROPONINI <0.03   ------------------------------------------------------------------------------------------------------------------  RADIOLOGY:  Dg Chest Portable 1 View  04/05/2015  CLINICAL DATA:  Shortness of Breath EXAM: PORTABLE CHEST 1 VIEW COMPARISON:  03/31/2015 FINDINGS: Cardiomediastinal silhouette is stable. Spinal stimulator wires mid thoracic spine again noted. There is chronic  elevation of the right hemidiaphragm. Stable right basilar atelectasis or scarring. No acute infiltrate or pulmonary edema. Bilateral shoulder prosthesis again noted.  IMPRESSION: No active disease. Again noted elevation of the right hemidiaphragm with and right basilar scarring. Bilateral shoulder prosthesis. Electronically Signed   By: Natasha Mead M.D.   On: 04/05/2015 18:04    EKG:   Orders placed or performed during the hospital encounter of 04/05/15  . ED EKG  . ED EKG  . EKG 12-Lead  . EKG 12-Lead  . EKG 12-Lead  . EKG 12-Lead    ASSESSMENT AND PLAN:   82 year old Caucasian female history of COPD presenting with shortness breath  1.Chronic obstructive pulmonary disease exacerbation:  Clinically improving. Provide DuoNeb treatments q. 4 hours, taper Solu-Medrol , continue Levaquin given allergies to azithromycin. Continue with home medications.  check ambulatory pulse ox IS   2. Hyperlipidemia unspecified statin therapy 3. Hypokalemia: Replace potassium, 4-5, check a.m. Labs  4. Abnormal urinalysis-urine culture is pending, continue Levaquin 4. Venous thromboembolism prophylactic: Heparin subcutaneous     All the records are reviewed and case discussed with Care Management/Social Workerr. Management plans discussed with the patient, family and they are in agreement.  CODE STATUS: fc   TOTAL TIME TAKING CARE OF THIS PATIENT: 35 minutes.   POSSIBLE D/C IN 1-2  DAYS, DEPENDING ON CLINICAL CONDITION.   Ramonita Lab M.D on 04/06/2015 at 2:37 PM  Between 7am to 6pm - Pager - [phone] After 6pm go to [email or web address] - password [email or web address] [facility]  [facility]  Office  [phone]  CC: Primary care physician; Hannah Beat, MD

## 2015-04-06 NOTE — Progress Notes (Signed)
Report called to [facility] on 1C. Patient going to room 116; belongings pack and included with transfer. Patient has called son with updated location. Assessment unchanged from this morning.

## 2015-04-06 NOTE — Plan of Care (Signed)
Pt transferred from 2A.  Has chronic back and shoulder pain.  Takes oxy q4 hrs.  Lives at home by herself but has been staying w/son lately.  Pt up to BR w/1 asst.  Takes medications whole w/water.  Pt has her own bag of home medications and refuses to have meds sent to pharmacy.  Have informed her she can't take her home meds here.  Uses O2 at home - 2L.   Pt has DM but isn't on sliding scale.  Followed by Diabetes coordinator.  Pt remained safe during shift - bed alarm on; near nurse's stn and she will call out for assistance to BR.  Hourly rounding performed.

## 2015-04-06 NOTE — Progress Notes (Signed)
PHARMACIST - PHYSICIAN ORDER COMMUNICATION  CONCERNING: P&T Medication Policy on Herbal Medications  DESCRIPTION:  This patient's order for:  melatonin  has been noted.  This product(s) is classified as an "herbal" or natural product. Due to a lack of definitive safety studies or FDA approval, nonstandard manufacturing practices, plus the potential risk of unknown drug-drug interactions while on inpatient medications, the Pharmacy and Therapeutics Committee does not permit the use of "herbal" or natural products of this type within [place].   ACTION TAKEN: The pharmacy department is unable to verify this order at this time. Please reevaluate patient's clinical condition at discharge and address if the herbal or natural product(s) should be resumed at that time.   

## 2015-04-07 LAB — BASIC METABOLIC PANEL
Anion gap: 7 (ref 5–15)
BUN: 36 mg/dL — AB (ref 6–20)
CO2: 30 mmol/L (ref 22–32)
CREATININE: 0.72 mg/dL (ref 0.44–1.00)
Calcium: 8.6 mg/dL — ABNORMAL LOW (ref 8.9–10.3)
Chloride: 100 mmol/L — ABNORMAL LOW (ref 101–111)
GFR calc Af Amer: >60 mL/min (ref >60)
Glucose, Bld: 110 mg/dL — ABNORMAL HIGH (ref 65–99)
POTASSIUM: 4.6 mmol/L (ref 3.5–5.1)
SODIUM: 137 mmol/L (ref 135–145)

## 2015-04-07 LAB — CBC
HEMATOCRIT: 38 % (ref 35.0–47.0)
Hemoglobin: 12.3 g/dL (ref 12.0–16.0)
MCH: 28.8 pg (ref 26.0–34.0)
MCHC: 32.4 g/dL (ref 32.0–36.0)
MCV: 88.8 fL (ref 80.0–100.0)
PLATELETS: 279 10*3/uL (ref 150–440)
RBC: 4.28 MIL/uL (ref 3.80–5.20)
RDW: 14.3 % (ref 11.5–14.5)
WBC: 17 10*3/uL — AB (ref 3.6–11.0)

## 2015-04-07 MED ORDER — LEVOFLOXACIN IN D5W 250 MG/50ML IV SOLN
250.0000 mg | INTRAVENOUS | Status: DC
  Filled 2015-04-07: qty 50

## 2015-04-07 MED ORDER — METOPROLOL TARTRATE 50 MG PO TABS
50.0000 mg | ORAL_TABLET | Freq: Two times a day (BID) | ORAL | Status: DC
  Administered 2015-04-07: 10:00:00 50 mg via ORAL
  Filled 2015-04-07: qty 1

## 2015-04-07 MED ORDER — METOPROLOL TARTRATE 50 MG PO TABS: 50.0000 mg | ORAL_TABLET | Freq: Two times a day (BID) | ORAL | Status: DC

## 2015-04-07 MED ORDER — LEVOFLOXACIN 500 MG PO TABS: 500.0000 mg | ORAL_TABLET | Freq: Every day | ORAL | Status: DC

## 2015-04-07 MED ORDER — PREDNISONE 10 MG (21) PO TBPK: 10.0000 mg | ORAL_TABLET | Freq: Every day | ORAL | Status: DC

## 2015-04-07 NOTE — Discharge Instructions (Signed)
Activity as tolerated , continue 2 lit of home oxygen via [place]  Diet - low fat , low salt F/u with pcp in a week

## 2015-04-07 NOTE — Discharge Summary (Signed)
[facility] - [place] at [facility]   PATIENT NAME: [name]    MR#:  [id number]  DATE OF BIRTH:  October 31, 1931  DATE OF ADMISSION:  04/05/2015 ADMITTING PHYSICIAN: Wyatt Haste, MD  DATE OF DISCHARGE: 04/07/2015 PRIMARY CARE PHYSICIAN: Hannah Beat, MD    ADMISSION DIAGNOSIS:  COPD exacerbation (HCC) [J44.1]  DISCHARGE DIAGNOSIS:  Principal Problem:   COPD exacerbation (HCC)   SECONDARY DIAGNOSIS:   Past Medical History  Diagnosis Date  . Hypertension 08/01/1995  . Rheumatoid arthritis(714.0) 04/03/1983  . Osteoarthritis 04/03/1983  . Anemia, deficiency 07/01/2004    (w/u by Dr. Lorre Nick)  . Diabetes mellitus type II 07/31/1997  . Osteoporosis 12/31/2000  . CAD (coronary artery disease) 09/01/2002    a. s/p NSTEMI 2012. b. s/p CABG 01/2012.  [name] Hyperlipemia 07/02/1999  . COPD (chronic obstructive pulmonary disease) (HCC) 05/31/2004  . diverticulosis of colon   . Arteriovenous malformation of gastrointestinal tract   . Fatty liver   . Chronic back pain   . Splenic artery aneurysm (HCC)   . Anxiety and depression   . GI bleed     a. 04/2011 felt diverticular requiring microembolization by vascular.  . Adrenal mass (HCC)     a. Excision 2007.  [name] Restless leg syndrome   . C. difficile colitis     a. 04/2008 = severe.  [name] Hx of CABG 04/18/2012  . Diastolic CHF (HCC)     a. 02/2012: resp failure with COPD exac/CHF/pleural eff.;  b.  Echocardiogram 08/29/12: EF 55-60%, mild LVH, mild LAE, impaired LV relaxation  . PAF (paroxysmal atrial fibrillation) (HCC) 12/16/2013  . 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 Chronic back pain greater than 3 months duration 11/30/2014    HOSPITAL COURSE:  82 year old Caucasian female history of COPD presenting with shortness breath  1.Chronic obstructive pulmonary disease exacerbation:  Clinically improved. Provided DuoNeb treatments q. 4 hours, tapered Solu-Medrol , continue Levaquin  given allergies to azithromycin. Continue with home medications. Encouraged to use incentive spirometry  2. Hyperlipidemia unspecified statin therapy  3. Hypokalemia: Replaced potassium prn  4. Abnormal urinalysis-urine culture - ecoli , treated with levaquin and will  D/c with  Levaquin   5. Ch diastolic CHF- no exacerbation, continue home meds  4. Venous thromboembolism prophylactic: Heparin subcutaneous  DISCHARGE CONDITIONS:  fair  CONSULTS OBTAINED:  Treatment Team:  Wyatt Haste, MD   PROCEDURES none  DRUG ALLERGIES:   Allergies  Allergen Reactions  . Other Diarrhea, Rash and Other (See Comments)    Uncoded Allergy. Allergen: mycins  Reaction: Sore tongue, raw mouth, weakness  . Penicillins Other (See Comments)    Other Reaction: NEAR SYNCOPE  . Sulfa Antibiotics Shortness Of Breath  . Morphine Other (See Comments)    Other Reaction: GI UPSET  . Morphine Sulfate Nausea And Vomiting    At high doses hallucinations Can take in low doses  . Macrolides And Ketolides Diarrhea and Nausea And Vomiting    Only causes GI distress at extremely high doses per patient  . Iron Diarrhea  . Naproxen Rash    DISCHARGE MEDICATIONS:   Current Discharge Medication List    START taking these medications   Details  levofloxacin (LEVAQUIN) 500 MG tablet Take 1 tablet (500 mg total) by mouth daily. Qty: 5 tablet, Refills: 0    predniSONE (STERAPRED UNI-PAK 21 TAB) 10 MG (21) TBPK tablet Take 1 tablet (10 mg total) by mouth daily. Take 6 tablets  by mouth for 1 day followed by  5 tablets by mouth for 1 day followed by  4 tablets by mouth for 1 day followed by  3 tablets by mouth for 1 day followed by  2 tablets by mouth for 1 day followed by  1 tablet by mouth for a day and stop Qty: 21 tablet, Refills: 0      CONTINUE these medications which have CHANGED   Details  metoprolol (LOPRESSOR) 50 MG tablet Take 1 tablet (50 mg total) by mouth 2 (two) times daily. Qty: 60  tablet, Refills: 0      CONTINUE these medications which have NOT CHANGED   Details  albuterol (PROAIR HFA) 108 (90 BASE) MCG/ACT inhaler Inhale 2 puffs into the lungs 4 (four) times daily. Qty: 18 g, Refills: 11    albuterol (PROVENTIL) (5 MG/ML) 0.5% nebulizer solution Take 0.5 mLs (2.5 mg total) by nebulization every 6 (six) hours as needed for wheezing. Qty: 20 mL, Refills: 12    aspirin EC 81 MG tablet Take 1 tablet (81 mg total) by mouth daily. Qty: 90 tablet, Refills: 3   Associated Diagnoses: Other and unspecified hyperlipidemia; CAD (coronary artery disease); Unspecified essential hypertension    atorvastatin (LIPITOR) 40 MG tablet TAKE ONE TABLET BY MOUTH EVERY DAY Qty: 30 tablet, Refills: 3    Docusate Calcium (STOOL SOFTENER PO) Take 3 tablets by mouth daily. Reported on 04/05/2015    Fluticasone-Salmeterol (ADVAIR DISKUS) 500-50 MCG/DOSE AEPB Inhale 1 puff into the lungs 2 (two) times daily. Qty: 60 each, Refills: 11    Melatonin 5 MG TABS Take 1 tablet by mouth at bedtime.    morphine (MS CONTIN) 15 MG 12 hr tablet Take 15 mg by mouth every 12 (twelve) hours.    nitroGLYCERIN (NITROSTAT) 0.4 MG SL tablet Place 1 tablet (0.4 mg total) under the tongue every 5 (five) minutes as needed for chest pain. Max 3 per day Qty: 25 tablet, Refills: 2    OxyCODONE HCl ER 30 MG T12A Take 1 tablet by mouth 2 (two) times daily.    ramipril (ALTACE) 5 MG capsule Take 1 capsule (5 mg total) by mouth 2 (two) times daily. Qty: 180 capsule, Refills: 3    tiotropium (SPIRIVA) 18 MCG inhalation capsule Place 1 capsule (18 mcg total) into inhaler and inhale daily. Qty: 30 capsule, Refills: 12    tiZANidine (ZANAFLEX) 4 MG tablet Take 0.5-1 tablets (2-4 mg total) by mouth Nightly. Qty: 30 tablet, Refills: 5    Omega-3 Fatty Acids (FISH OIL) 1000 MG CAPS Take 1 capsule by mouth daily.    Probiotic Product (PHILLIPS COLON HEALTH PO) Take 1 capsule by mouth daily.    VITAMIN E PO Take by  mouth.      STOP taking these medications     predniSONE (DELTASONE) 20 MG tablet          DISCHARGE INSTRUCTIONS:   Activity as tolerated , continue 2 lit of home oxygen via [place]  Diet - low fat , low salt  F/u with pcp in a week   DIET:  Low fat , low salt  DISCHARGE CONDITION:  fair  ACTIVITY:  As tolerated   OXYGEN:  Home Oxygen: yes   Oxygen Delivery: 2 lit of o2 via   DISCHARGE LOCATION:  home  If you experience worsening of your admission symptoms, develop shortness of breath, life threatening emergency, suicidal or homicidal thoughts you must seek medical attention immediately by calling 911 or calling  your MD immediately  if symptoms less severe.  You Must read complete instructions/literature along with all the possible adverse reactions/side effects for all the Medicines you take and that have been prescribed to you. Take any new Medicines after you have completely understood and accpet all the possible adverse reactions/side effects.   Please note  You were cared for by a hospitalist during your hospital stay. If you have any questions about your discharge medications or the care you received while you were in the hospital after you are discharged, you can call the unit and asked to speak with the hospitalist on call if the hospitalist that took care of you is not available. Once you are discharged, your primary care physician will handle any further medical issues. Please note that NO REFILLS for any discharge medications will be authorized once you are discharged, as it is imperative that you return to your primary care physician (or establish a relationship with a primary care physician if you do not have one) for your aftercare needs so that they can reassess your need for medications and monitor your lab values.     Today  Chief Complaint  Patient presents with  . Shortness of Breath   Pt is feeling better , sob improved , wants to go home  ROS:   CONSTITUTIONAL: Denies fevers, chills. Denies any fatigue, weakness.  EYES: Denies blurry vision, double vision, eye pain. EARS, NOSE, THROAT: Denies tinnitus, ear pain, hearing loss. RESPIRATORY: Denies cough, wheeze, shortness of breath.  CARDIOVASCULAR: Denies chest pain, palpitations, edema.  GASTROINTESTINAL: Denies nausea, vomiting, diarrhea, abdominal pain. Denies bright red blood per rectum. GENITOURINARY: Denies dysuria, hematuria. ENDOCRINE: Denies nocturia or thyroid problems. HEMATOLOGIC AND LYMPHATIC: Denies easy bruising or bleeding. SKIN: Denies rash or lesion. MUSCULOSKELETAL: Denies pain in neck, back, shoulder, knees, hips or arthritic symptoms.  NEUROLOGIC: Denies paralysis, paresthesias.  PSYCHIATRIC: Denies anxiety or depressive symptoms.   VITAL SIGNS:  Blood pressure 156/59, pulse 93, temperature 98 F (36.7 C), temperature source Oral, resp. rate 20, height 5' (1.524 m), weight 61.236 kg (135 lb), SpO2 96 %.  I/O:   Intake/Output Summary (Last 24 hours) at 04/07/15 1126 Last data filed at 04/07/15 0900  Gross per 24 hour  Intake    360 ml  Output   1300 ml  Net   -940 ml    PHYSICAL EXAMINATION:  GENERAL:  82 y.o.-year-old patient lying in the bed with no acute distress.  EYES: Pupils equal, round, reactive to light and accommodation. No scleral icterus. Extraocular muscles intact.  HEENT: Head atraumatic, normocephalic. Oropharynx and nasopharynx clear.  NECK:  Supple, no jugular venous distention. No thyroid enlargement, no tenderness.  LUNGS: Normal breath sounds bilaterally, no wheezing, rales,rhonchi or crepitation. No use of accessory muscles of respiration.  CARDIOVASCULAR: S1, S2 normal. No murmurs, rubs, or gallops.  ABDOMEN: Soft, non-tender, non-distended. Bowel sounds present. No organomegaly or mass.  EXTREMITIES: No pedal edema, cyanosis, or clubbing.  NEUROLOGIC: Cranial nerves II through XII are intact. Muscle strength 5/5 in all  extremities. Sensation intact. Gait not checked.  PSYCHIATRIC: The patient is alert and oriented x 3.  SKIN: No obvious rash, lesion, or ulcer.   DATA REVIEW:   CBC  Recent Labs Lab 04/07/15 0433  WBC 17.0*  HGB 12.3  HCT 38.0  PLT 279    Chemistries   Recent Labs Lab 04/05/15 1734  04/07/15 0433  NA 134*  --  137  K 3.4*  --  4.6  CL 95*  --  100*  CO2 30  --  30  GLUCOSE 272*  --  110*  BUN 35*  --  36*  CREATININE 0.89  < > 0.72  CALCIUM 9.1  --  8.6*  AST 17  --   --   ALT 16  --   --   ALKPHOS 54  --   --   BILITOT 0.5  --   --   < > = values in this interval not displayed.  Cardiac Enzymes  Recent Labs Lab 04/05/15 1734  TROPONINI <0.03    Microbiology Results  Results for orders placed or performed during the hospital encounter of 04/05/15  Urine culture     Status: None (Preliminary result)   Collection Time: 04/05/15  7:52 PM  Result Value Ref Range Status   Specimen Description URINE, RANDOM  Final   Special Requests NONE  Final   Culture   Final    >=100,000 COLONIES/mL GRAM NEGATIVE RODS IDENTIFICATION AND SUSCEPTIBILITIES TO FOLLOW    Report Status PENDING  Incomplete    RADIOLOGY:  Dg Chest Portable 1 View  04/05/2015  CLINICAL DATA:  Shortness of Breath EXAM: PORTABLE CHEST 1 VIEW COMPARISON:  03/31/2015 FINDINGS: Cardiomediastinal silhouette is stable. Spinal stimulator wires mid thoracic spine again noted. There is chronic elevation of the right hemidiaphragm. Stable right basilar atelectasis or scarring. No acute infiltrate or pulmonary edema. Bilateral shoulder prosthesis again noted. IMPRESSION: No active disease. Again noted elevation of the right hemidiaphragm with and right basilar scarring. Bilateral shoulder prosthesis. Electronically Signed   By: Natasha Mead M.D.   On: 04/05/2015 18:04    EKG:   Orders placed or performed during the hospital encounter of 04/05/15  . ED EKG  . ED EKG  . EKG 12-Lead  . EKG 12-Lead  . EKG  12-Lead  . EKG 12-Lead      Management plans discussed with the patient, family and they are in agreement.  CODE STATUS:     Code Status Orders        Start     Ordered   04/05/15 2242  Full code   Continuous     04/05/15 2242      TOTAL TIME TAKING CARE OF THIS PATIENT: 45  minutes.    @[facility] @  on 04/07/2015 at 11:26 AM  Between 7am to 6pm - Pager - [phone]  After 6pm go to [email or web address] - password [email or web address] [facility]  [facility]  Office  [phone]  CC: Primary care physician; Hannah Beat, MD

## 2015-04-07 NOTE — Plan of Care (Signed)
Escorted via wheelchair by auxiliary staff. All belongings sent with patient and family.   

## 2015-04-07 NOTE — Care Management Important Message (Signed)
Important Message  Patient Details  Name: [name] MRN: [id number] Date of Birth: 08-17-1931   Medicare Important Message Given:  Yes    Olegario Messier A Acacia Latorre 04/07/2015, 11:51 AM

## 2015-04-07 NOTE — Progress Notes (Signed)
MD making rounds. Discharge orders received. Appointment Scheduled. IV removed. Prescriptions given to patient. Discharge paperwork provided, explained, signed and witnessed. Education handouts provided to patient. No unanswered questions.

## 2015-04-07 NOTE — Progress Notes (Signed)
SATURATION QUALIFICATIONS: (This note is used to comply with regulatory documentation for home oxygen)  Patient Saturations on Room Air at Rest = 94%  Patient Saturations on Room Air while Ambulating = 88%  Patient Saturations on 2 Liters of oxygen while Ambulating = 92%  Please briefly explain why patient needs home oxygen: 

## 2015-04-08 ENCOUNTER — Telehealth: Payer: Self-pay | Admitting: *Deleted

## 2015-04-08 LAB — URINE CULTURE

## 2015-04-08 NOTE — Telephone Encounter (Signed)
Transitional care call attempted.  Left message for patient to return call. 

## 2015-04-11 NOTE — Telephone Encounter (Signed)
Transitional care call attempted.  Left message for patient to return call. 

## 2015-04-13 ENCOUNTER — Encounter: Payer: Self-pay | Admitting: Family Medicine

## 2015-04-13 ENCOUNTER — Ambulatory Visit (INDEPENDENT_AMBULATORY_CARE_PROVIDER_SITE_OTHER): Payer: Medicare Other | Admitting: Family Medicine

## 2015-04-13 VITALS — BP 162/76 | HR 77 | Temp 97.8°F | Ht 60.0 in

## 2015-04-13 DIAGNOSIS — I5033 Acute on chronic diastolic (congestive) heart failure: Secondary | ICD-10-CM | POA: Diagnosis not present

## 2015-04-13 DIAGNOSIS — I480 Paroxysmal atrial fibrillation: Secondary | ICD-10-CM

## 2015-04-13 DIAGNOSIS — J9611 Chronic respiratory failure with hypoxia: Secondary | ICD-10-CM

## 2015-04-13 DIAGNOSIS — R0600 Dyspnea, unspecified: Secondary | ICD-10-CM

## 2015-04-13 DIAGNOSIS — Z951 Presence of aortocoronary bypass graft: Secondary | ICD-10-CM

## 2015-04-13 DIAGNOSIS — J441 Chronic obstructive pulmonary disease with (acute) exacerbation: Secondary | ICD-10-CM | POA: Diagnosis not present

## 2015-04-13 DIAGNOSIS — J438 Other emphysema: Secondary | ICD-10-CM | POA: Diagnosis not present

## 2015-04-13 DIAGNOSIS — R600 Localized edema: Secondary | ICD-10-CM

## 2015-04-13 MED ORDER — METHYLPREDNISOLONE ACETATE 80 MG/ML IJ SUSP
80.0000 mg | Freq: Once | INTRAMUSCULAR | Status: AC
  Administered 2015-04-13: 80 mg via INTRAMUSCULAR

## 2015-04-13 MED ORDER — PREDNISONE 20 MG PO TABS: ORAL_TABLET | ORAL | Status: DC

## 2015-04-13 NOTE — Progress Notes (Signed)
Dr. Karleen Hampshire T. Daiana Vitiello, MD, CAQ Sports Medicine Primary Care and Sports Medicine [address] [place], [address] Phone: [phone] Fax: [phone]  04/13/2015  Patient: [name], MRN: [id number], DOB: 05-Nov-1931, 82 y.o.  Primary Physician:  Hannah Beat, MD   Chief Complaint  Patient presents with  . Hospitalization Follow-up    COPD   Subjective:   [name] is a 82 y.o. very pleasant female patient who presents with the following:  Transitional Care OV: 1/3 - 04/06/2014, Discharge from [facility]  RN attempted multiple telephone calls to contact patient.  Principal Problem:  COPD exacerbation (HCC)  This is actually the second admission that the patient has had in the last 2 [place].  She was initially admitted at [facility] in [facility].  In both admissions she was admitted and given Solu-Medrol, then discharged with p.o. Antibiotics after 2-3 days.  Currently, she was discharged with 6 days of oral prednisone.  On a taper, and she is currently taking 20 mg daily.  She felt better at the time of discharge, and now she is significantly more short of breath.  She was also discharged on Levaquin, and she has now completed all of her antibiotics.  After being discharged from [facility], similarly with increased difficulties as she had decreased amounts of steroids.  Prior to admission, the patient resumed smoking, and she was smoking upwards of one pack per day, and prior to this she had not been smoking for at least a few years. She and her daughter at least have some confusion about discharge instructions regarding the use of her inhalers. Previously, she has been intermittently resistant to using these, and his stopped them on her own.  There've also been financial difficulties.  Feels really weak, then gets discharged and not fully recooperating. At baseline the patient normally walks and unassisted, lives independently, and she  uses a cane only.  Today she is in a wheelchair and decidedly looks different compared to her baseline.  At the time of discharge, no home health services or PT was arranged.  Portable oxygen.  [facility].  Home health nursing and PT.   Past Medical History, Surgical History, Social History, Family History, Problem List, Medications, and Allergies have been reviewed and updated if relevant.  Patient Active Problem List   Diagnosis Date Noted  . History of non-ST elevation myocardial infarction (NSTEMI) 07/22/2011    Priority: High  . GIB (gastrointestinal bleeding) 04/25/2011    Priority: High  . COPD with emphysema (HCC) 05/31/2004    Priority: High  . CAD (coronary artery disease) 09/01/2002    Priority: High  . Chronic back pain greater than 3 months duration 11/30/2014    Priority: Medium  . Smoking 02/09/2011    Priority: Medium  . AAA (abdominal aortic aneurysm) (HCC) 12/22/2010    Priority: Medium  . HYPERLIPIDEMIA 07/02/1999    Priority: Medium  . Diabetes mellitus type 2 with neurological manifestations (HCC) 07/31/1997    Priority: Medium  . Essential hypertension, benign 08/01/1995    Priority: Medium  . Rheumatoid arthritis(714.0) 04/03/1983    Priority: Medium  . COPD exacerbation (HCC) 04/05/2015  . Pedal edema 08/31/2014  . Dyspnea 08/31/2014  . 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 PAF (paroxysmal atrial fibrillation) (HCC) 12/16/2013  . History of GI diverticular bleed 09/25/2012  . Depression 06/17/2012  . Hx of CABG 04/18/2012  . Acute on chronic diastolic CHF (  congestive heart failure) (HCC) 02/05/2012  . Splenic artery aneurysm (HCC)   . DIVERTICULOSIS OF COLON 05/14/2008  . ANXIETY DEPRESSION 04/19/2008  . ECZEMA 07/21/2007  . ARTERIOVENOUS MALFORMATION, GASTRIC 11/08/2006  . INSOMNIA, CHRONIC 07/21/2006  . ANEMIA-IRON DEFICIENCY 07/01/2004  . FATTY LIVER DISEASE 07/16/2002  . OSTEOPOROSIS 12/31/2000  .  OSTEOARTHRITIS 04/03/1983    Past Medical History  Diagnosis Date  . Hypertension 08/01/1995  . Rheumatoid arthritis(714.0) 04/03/1983  . Osteoarthritis 04/03/1983  . Anemia, deficiency 07/01/2004    (w/u by Dr. Lorre Nick)  . Diabetes mellitus type II 07/31/1997  . Osteoporosis 12/31/2000  . CAD (coronary artery disease) 09/01/2002    a. s/p NSTEMI 2012. b. s/p CABG 01/2012.  [name] Hyperlipemia 07/02/1999  . COPD (chronic obstructive pulmonary disease) (HCC) 05/31/2004  . diverticulosis of colon   . Arteriovenous malformation of gastrointestinal tract   . Fatty liver   . Chronic back pain   . Splenic artery aneurysm (HCC)   . Anxiety and depression   . GI bleed     a. 04/2011 felt diverticular requiring microembolization by vascular.  . Adrenal mass (HCC)     a. Excision 2007.  [name] Restless leg syndrome   . C. difficile colitis     a. 04/2008 = severe.  [name] Hx of CABG 04/18/2012  . Diastolic CHF (HCC)     a. 02/2012: resp failure with COPD exac/CHF/pleural eff.;  b.  Echocardiogram 08/29/12: EF 55-60%, mild LVH, mild LAE, impaired LV relaxation  . PAF (paroxysmal atrial fibrillation) (HCC) 12/16/2013  . 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 Chronic back pain greater than 3 months duration 11/30/2014    Past Surgical History  Procedure Laterality Date  . Nasal sinus surgery    . Inguinal hernia repair    . Partial hysterectomy      ovaries intact, dysmennorhea w/ A/P repari  . Carpal tunnel release      bilat  . Trigger finger release      right  . Wrist surgery      neuroma repair, right  . Ankle surgery      neuroma repair, right ORIF  . Total shoulder replacement  12/13/1998    right, ([place])  . Rotator cuff repair  11/22/2003    ([place]) left  . Epidural block injection  05/2005    x 3  . Long finger tendon realignment  10/31/2005    [place]  . Lumbar epidural  06/13/2006  . Laminectomy  06/2006    L4/5 spinal stenosis (Dr. [name])  .  Laminectomy  02/24/2008    L3/4 Ant/Lat interbody fusion and Lat Artthrodesis w/plate using XLIF (Dr. [name])  . Colonic embolization  04/18/2011    Dr. Wyn Quaker, acute GI bleed  . Spinal cord stimulator implant    . Coronary artery bypass graft  01/22/2012    Procedure: CORONARY ARTERY BYPASS GRAFTING (CABG);  Surgeon: Delight Ovens, MD;  Location[facility] OR;  Service: Open Heart Surgery;  Laterality: N/A;  Coronary Artery Bypass Graft times four utilizing the left internal mammary artery and the left greater saphenous vein harvested endoscopically.  Rhae Hammock without cardioversion  01/22/2012    Procedure: TRANSESOPHAGEAL ECHOCARDIOGRAM (TEE);  Surgeon: Delight Ovens, MD;  Location: [facility] OR;  Service: Open Heart Surgery;  Laterality: N/A;    Social History   Social History  . Marital Status: Widowed    Spouse Name: N/A  . Number of Children: 4  . Years of Education:  N/A   Occupational History  . [employer]     9 hour days-6 days/week   Social History Main Topics  . Smoking status: Former Smoker -- 0.10 packs/day for 50 years    Types: Cigarettes    Quit date: 12/18/2014  . Smokeless tobacco: Former Neurosurgeon  . Alcohol Use: 0.0 oz/week    0 Standard drinks or equivalent per week     Comment: red wine occassionally  . Drug Use: No  . Sexual Activity: No   Other Topics Concern  . Not on file   Social History Narrative   Married, widow 79          Family History  Problem Relation Age of Onset  . Hypotension Mother   . Dementia Mother   . Alcohol abuse Father   . Heart disease Father     MI, enlarged heart  . Diabetes Brother     Allergies  Allergen Reactions  . Other Diarrhea, Rash and Other (See Comments)    Uncoded Allergy. Allergen: mycins  Reaction: Sore tongue, raw mouth, weakness  . Penicillins Other (See Comments)    Other Reaction: NEAR SYNCOPE  . Sulfa Antibiotics Shortness Of Breath  . Morphine Other (See Comments)    Other Reaction: GI UPSET  . Morphine Sulfate  Nausea And Vomiting    At high doses hallucinations Can take in low doses  . Macrolides And Ketolides Diarrhea and Nausea And Vomiting    Only causes GI distress at extremely high doses per patient  . Iron Diarrhea  . Naproxen Rash    Medication list reviewed and updated in full in [facility].   GEN: No acute illnesses, no fevers, chills. GI: No n/v/d, eating normally Pulm: SOB at rest and worse with minimal exertion No chest pain Otherwise, the pertinent positives and negatives are listed above and in the HPI, otherwise a full review of systems has been reviewed and is negative unless noted positive.   Objective:   BP 162/76 mmHg  Pulse 77  Temp(Src) 97.8 F (36.6 C) (Oral)  Ht 5' (1.524 m)  SpO2 95% 95% is on 2 L of 02  GEN: WDWN, NAD, Non-toxic, A & O x 3 HEENT: Atraumatic, Normocephalic. Neck supple. No masses, No LAD. Ears and Nose: No external deformity. CV: RRR, No M/G/R. No JVD. No thrill. No extra heart sounds. PULM: No crackles, mild rhonchi with diffuse wheezing and decreased breath sounds.  EXTR: No c/c/tr LE edema NEURO currently in wheelchair  PSYCH: Normally interactive. Conversant. Not depressed or anxious appearing.  Calm demeanor.   Laboratory and Imaging Data: Dg Chest Portable 1 View  04/05/2015 CLINICAL DATA: Shortness of Breath EXAM: PORTABLE CHEST 1 VIEW COMPARISON: 03/31/2015 FINDINGS: Cardiomediastinal silhouette is stable. Spinal stimulator wires mid thoracic spine again noted. There is chronic elevation of the right hemidiaphragm. Stable right basilar atelectasis or scarring. No acute infiltrate or pulmonary edema. Bilateral shoulder prosthesis again noted. IMPRESSION: No active disease. Again noted elevation of the right hemidiaphragm with and right basilar scarring. Bilateral shoulder prosthesis. Electronically Signed By: Natasha Mead M.D. On: 04/05/2015 18:04   Assessment and Plan:   Other emphysema (HCC) - Plan: Ambulatory referral  to Home Health, For home use only DME oxygen, CANCELED: For home use only DME oxygen  COPD exacerbation (HCC) - Plan: Ambulatory referral to Home Health, methylPREDNISolone acetate (DEPO-MEDROL) injection 80 mg, For home use only DME oxygen, CANCELED: For home use only DME oxygen  Chronic respiratory failure with hypoxia (  HCC)  Acute on chronic diastolic CHF (congestive heart failure) (HCC) - Plan: Ambulatory referral to Home Health, For home use only DME oxygen  Dyspnea - Plan: Ambulatory referral to Home Health, For home use only DME oxygen, CANCELED: For home use only DME oxygen  Hx of CABG - Plan: Ambulatory referral to Home Health, For home use only DME oxygen  Pedal edema - Plan: Ambulatory referral to Home Health, For home use only DME oxygen  PAF (paroxysmal atrial fibrillation) (HCC) - Plan: Ambulatory referral to Home Health, For home use only DME oxygen  Level of medical decision making is high  >60 minutes spent in face to face time with patient, >50% spent in counselling or coordination of care   Severe COPD exacerbation, likely brought on by resuming high-volume smoking.  Status post 2 hospitalizations within the last 2 [place].  Currently significantly short of breath.  Depo-Medrol 80 mg IM right now.  Oxygen at all times.  Starting tomorrow, longer 14 days of prednisone.  I reviewed all of her inhalers myself personally with the patient and her daughter.  Currently quite weak.  Currently using a walker and wheelchair and at baseline she is using a cane.  Home Health documentation: Face to face encounter documentation follows: Patient needs home health services for the following reasons:  Home health services needed: 1. HHRN assessment: please assess home situation, need for assistive devices, assess bathroom and overall safety. 2. HHPT: severe weakness status post prolonged COPD exacerbation and 2 hospitalizations.  Evaluate and treat, assist with strengthening exercises  and gait.  2-3 times weekly for 4 [place]. Patient homebound for the following reasons who: chronic COPD and chronic respiratory failure, inability to ambulate 50 feet at this point.  Chronically on oxygen at home.  Documented need for home oxygen: 04/07/2015 nursing notes: SATURATION QUALIFICATIONS: (This note is used to comply with regulatory documentation for home oxygen)  Patient Saturations on Room Air at Rest = 94%  Patient Saturations on Room Air while Ambulating = 88%  Patient Saturations on 2 Liters of oxygen while Ambulating = 92% - this is not a change in status, and the patient has had oxygen and qualified for oxygen based on desaturations for COPD for multiple years.  [facility]. In coming to our office today, the patient did not have portable oxygen, and was remarkably short of breath walking from the parking area to the waiting room which is approximately 20 yards.  She remained short of breath until she was brought back into the back with 2 L of oxygen where her saturations improved to 95%. I am asking to maintain her home oxygen and add portable oxygen.  Follow-up: 2 [place]  New Prescriptions   PREDNISONE (DELTASONE) 20 MG TABLET    2 tabs po for 7 days, then 1 tab po for 7 days   Patient Instructions  Advair (Purple) 1 puff twice a day Spiriva 1 puff once a day  Ventolin or albuterol (Liquid or inhaler)- either one for right now 4 times a day at least  Prednisone 20 mg. Start tomorrow: 2 tablets for 7 days, then 1 tablet for 7 days   Shirlee Limerick with help with home health and oxygen.     Orders Placed This Encounter  Procedures  . For home use only DME oxygen  . Ambulatory referral to Home Health    Signed,  Elpidio Galea. Blimy Napoleon, MD   Patient's Medications  New Prescriptions  PREDNISONE (DELTASONE) 20 MG TABLET    2 tabs po for 7 days, then 1 tab po for 7 days  Previous Medications   ALBUTEROL (PROAIR HFA) 108 (90 BASE) MCG/ACT INHALER     Inhale 2 puffs into the lungs 4 (four) times daily.   ALBUTEROL (PROVENTIL) (2.5 MG/3ML) 0.083% NEBULIZER SOLUTION    VVN Q 4 H PRF WHEEZING   ASPIRIN EC 81 MG TABLET    Take 1 tablet (81 mg total) by mouth daily.   ATORVASTATIN (LIPITOR) 40 MG TABLET    TAKE ONE TABLET BY MOUTH EVERY DAY   DOCUSATE CALCIUM (STOOL SOFTENER PO)    Take 3 tablets by mouth daily. Reported on 04/05/2015   FLUTICASONE-SALMETEROL (ADVAIR DISKUS) 500-50 MCG/DOSE AEPB    Inhale 1 puff into the lungs 2 (two) times daily.   IPRATROPIUM (ATROVENT) 0.02 % NEBULIZER SOLUTION       MELATONIN 5 MG TABS    Take 1 tablet by mouth at bedtime.   METOPROLOL (LOPRESSOR) 50 MG TABLET    Take 1 tablet (50 mg total) by mouth 2 (two) times daily.   MORPHINE (MS CONTIN) 15 MG 12 HR TABLET    Take 15 mg by mouth every 12 (twelve) hours.   NITROGLYCERIN (NITROSTAT) 0.4 MG SL TABLET    Place 1 tablet (0.4 mg total) under the tongue every 5 (five) minutes as needed for chest pain. Max 3 per day   OMEGA-3 FATTY ACIDS (FISH OIL) 1000 MG CAPS    Take 1 capsule by mouth daily.   OXYCODONE HCL ER 30 MG T12A    Take 1 tablet by mouth 2 (two) times daily.   PROBIOTIC PRODUCT (PHILLIPS COLON HEALTH PO)    Take 1 capsule by mouth daily.   RAMIPRIL (ALTACE) 5 MG CAPSULE    Take 1 capsule (5 mg total) by mouth 2 (two) times daily.   TIOTROPIUM (SPIRIVA) 18 MCG INHALATION CAPSULE    Place 1 capsule (18 mcg total) into inhaler and inhale daily.   TIZANIDINE (ZANAFLEX) 4 MG TABLET    Take 0.5-1 tablets (2-4 mg total) by mouth Nightly.   VITAMIN E PO    Take by mouth.  Modified Medications   No medications on file  Discontinued Medications   ALBUTEROL (PROVENTIL) (5 MG/ML) 0.5% NEBULIZER SOLUTION    Take 0.5 mLs (2.5 mg total) by nebulization every 6 (six) hours as needed for wheezing.   LEVOFLOXACIN (LEVAQUIN) 500 MG TABLET    Take 1 tablet (500 mg total) by mouth daily.   PREDNISONE (DELTASONE) 10 MG TABLET    Take 10 mg by mouth as directed.    PREDNISONE (DELTASONE) 20 MG TABLET       PREDNISONE (STERAPRED UNI-PAK 21 TAB) 10 MG (21) TBPK TABLET    Take 1 tablet (10 mg total) by mouth daily. Take 6 tablets by mouth for 1 day followed by  5 tablets by mouth for 1 day followed by  4 tablets by mouth for 1 day followed by  3 tablets by mouth for 1 day followed by  2 tablets by mouth for 1 day followed by  1 tablet by mouth for a day and stop

## 2015-04-13 NOTE — Telephone Encounter (Signed)
Unable to contact patient.  Transitional care management/hospital follow up visit scheduled today 04/13/15 at 1400.

## 2015-04-13 NOTE — Patient Instructions (Addendum)
Advair (Purple) 1 puff twice a day Spiriva 1 puff once a day  Ventolin or albuterol (Liquid or inhaler)- either one for right now 4 times a day at least  Prednisone 20 mg. Start tomorrow: 2 tablets for 7 days, then 1 tablet for 7 days   Shirlee Limerick with help with home health and oxygen.

## 2015-04-13 NOTE — Progress Notes (Signed)
Pre visit review using our clinic review tool, if applicable. No additional management support is needed unless otherwise documented below in the visit note. 

## 2015-04-14 DIAGNOSIS — J9611 Chronic respiratory failure with hypoxia: Secondary | ICD-10-CM | POA: Insufficient documentation

## 2015-04-19 ENCOUNTER — Encounter: Payer: Self-pay | Admitting: Cardiology

## 2015-04-19 ENCOUNTER — Ambulatory Visit (INDEPENDENT_AMBULATORY_CARE_PROVIDER_SITE_OTHER): Payer: Medicare Other | Admitting: Cardiology

## 2015-04-19 VITALS — BP 160/70 | HR 62 | Ht 60.0 in | Wt 129.4 lb

## 2015-04-19 DIAGNOSIS — E785 Hyperlipidemia, unspecified: Secondary | ICD-10-CM | POA: Diagnosis not present

## 2015-04-19 DIAGNOSIS — I10 Essential (primary) hypertension: Secondary | ICD-10-CM

## 2015-04-19 MED ORDER — METOPROLOL TARTRATE 50 MG PO TABS: 50.0000 mg | ORAL_TABLET | Freq: Two times a day (BID) | ORAL | Status: DC

## 2015-04-19 MED ORDER — RAMIPRIL 5 MG PO CAPS: 10.0000 mg | ORAL_CAPSULE | Freq: Every day | ORAL | Status: AC

## 2015-04-19 MED ORDER — AMLODIPINE BESYLATE 5 MG PO TABS: 5.0000 mg | ORAL_TABLET | Freq: Every day | ORAL | Status: DC

## 2015-04-19 MED ORDER — ATORVASTATIN CALCIUM 40 MG PO TABS: 40.0000 mg | ORAL_TABLET | Freq: Every day | ORAL | Status: DC

## 2015-04-19 NOTE — Patient Instructions (Signed)
Medication Instructions:  Your physician has recommended you make the following change in your medication:  START Amlodipine 5mg  daily. An Rx has been sent to your pharmacy  Your cardiac medications have been refilled today    Labwork: Your physician recommends that you return for a FASTING lipid profile and lft on return  Testing/Procedures: None ordered  Follow-Up: Your physician recommends that you schedule a follow-up appointment in: 3 months with Dr.Mclean      Any Other Special Instructions Will Be Listed Below (If Applicable).     If you need a refill on your cardiac medications before your next appointment, please call your pharmacy.

## 2015-04-19 NOTE — Progress Notes (Signed)
04/19/2015 [name]   [birth year]  [id number]  Primary Physician Hannah Beat, MD Primary Cardiologist: Dr. Shirlee Latch   Reason for Visit/CC: 3 month f/u for CAD, chronic diastolic HF, HTN and PAF  HPI:  [name] presents to clinic for 3 month f/u. She is followed by Dr. Shirlee Latch. She has a history of COPD, HTN, diabetes, and CAD. She was admitted to [facility] in 04/2011 with a lower GI bleed that was thought to be diverticular. She required blood transfusion. Due to ongoing episodes of chest pain, she had LHC in 01/2012, and she ended up having CABG x 4 per Dr. Tyrone Sage. She did well post-op and was sent to a nursing home. Readmitted in 02/2012 with acute on chronic diastolic CHF and an acute exacerbation of COPD. She was diuresed and treated with antibiotics. Last echo in 12/2013 at [facility] showed EF 55%. She had a lone episode of AF and went to [facility] back in September of 2015 - converted on her own and sounded like it was triggered by bronchitis. Echo updated and showed normal EF with mild LAE. She is only on aspirin for her anticoagulation per her wishes.   She was seen by Melanie Fredrickson, NP, on 01/19/15. She was felt to be stable. She denied CP. EKG showed NSr. Volume/ weight was stable. Her BP was a bit elevated, thus her Ramipril was increased to BID. She was instructed to f/u in 3 months. Since that time, she was readmitted for acute COPD exacerbation but has recovered and has finished her antibiotics. She is at home with [facility] assisting.   She presents back to clinic today for f/u. She is accompanied by her daughter-in-law. She reports that she has done well. She denies any CP. No dyspnea, weight gain, LEE, orthopnea or PND. She reports full compliance. She is staying away from cigarettes.     Current Outpatient Prescriptions  Medication Sig Dispense Refill  . albuterol (PROAIR HFA) 108 (90 BASE) MCG/ACT inhaler Inhale 2 puffs into the lungs 4 (four) times daily. 18 g  11  . albuterol (PROVENTIL) (2.5 MG/3ML) 0.083% nebulizer solution 1 vial in nebulizer and inhale every 4 hours as needed for wheezing and shortness of breath  1  . aspirin EC 81 MG tablet Take 1 tablet (81 mg total) by mouth daily. 90 tablet 3  . atorvastatin (LIPITOR) 40 MG tablet TAKE ONE TABLET BY MOUTH EVERY DAY 30 tablet 3  . Docusate Calcium (STOOL SOFTENER PO) Take 3 tablets by mouth daily. Reported on 04/05/2015    . Fluticasone-Salmeterol (ADVAIR DISKUS) 500-50 MCG/DOSE AEPB Inhale 1 puff into the lungs 2 (two) times daily. 60 each 11  . Melatonin 5 MG TABS Take 1 tablet by mouth at bedtime.    . metoprolol (LOPRESSOR) 50 MG tablet Take 1 tablet (50 mg total) by mouth 2 (two) times daily. 60 tablet 0  . morphine (MS CONTIN) 15 MG 12 hr tablet Take 15 mg by mouth every 12 (twelve) hours.    . nitroGLYCERIN (NITROSTAT) 0.4 MG SL tablet Place 1 tablet (0.4 mg total) under the tongue every 5 (five) minutes as needed for chest pain. Max 3 per day 25 tablet 2  . Omega-3 Fatty Acids (FISH OIL) 1000 MG CAPS Take 1 capsule by mouth daily.    . OxyCODONE HCl ER 30 MG T12A Take 1 tablet by mouth 2 (two) times daily.    . Probiotic Product (PHILLIPS COLON HEALTH PO) Take 1 capsule by  mouth daily.    . ramipril (ALTACE) 5 MG capsule Take 10 mg by mouth at bedtime.    [name] tiotropium (SPIRIVA) 18 MCG inhalation capsule Place 1 capsule (18 mcg total) into inhaler and inhale daily. 30 capsule 12  . tiZANidine (ZANAFLEX) 4 MG tablet Take 0.5-1 tablets (2-4 mg total) by mouth Nightly. 30 tablet 5  . VITAMIN E PO Take 1 capsule by mouth daily.      No current facility-administered medications for this visit.    Allergies  Allergen Reactions  . Other Diarrhea, Rash and Other (See Comments)    Uncoded Allergy. Allergen: mycins  Reaction: Sore tongue, raw mouth, weakness  . Penicillins Other (See Comments)    Other Reaction: NEAR SYNCOPE  . Sulfa Antibiotics Shortness Of Breath  . Morphine Other (See  Comments)    Other Reaction: GI UPSET  . Morphine Sulfate Nausea And Vomiting    At high doses hallucinations Can take in low doses  . Macrolides And Ketolides Diarrhea and Nausea And Vomiting    Only causes GI distress at extremely high doses per patient  . Iron Diarrhea  . Naproxen Rash    Social History   Social History  . Marital Status: Widowed    Spouse Name: N/A  . Number of Children: 4  . Years of Education: N/A   Occupational History  . [employer]     9 hour days-6 days/week   Social History Main Topics  . Smoking status: Former Smoker -- 0.10 packs/day for 50 years    Types: Cigarettes    Quit date: 12/18/2014  . Smokeless tobacco: Former Neurosurgeon  . Alcohol Use: 0.0 oz/week    0 Standard drinks or equivalent per week     Comment: red wine occassionally  . Drug Use: No  . Sexual Activity: No   Other Topics Concern  . Not on file   Social History Narrative   Married, widow 1972           Review of Systems: General: negative for chills, fever, night sweats or weight changes.  Cardiovascular: negative for chest pain, dyspnea on exertion, edema, orthopnea, palpitations, paroxysmal nocturnal dyspnea or shortness of breath Dermatological: negative for rash Respiratory: negative for cough or wheezing Urologic: negative for hematuria Abdominal: negative for nausea, vomiting, diarrhea, bright red blood per rectum, melena, or hematemesis Neurologic: negative for visual changes, syncope, or dizziness All other systems reviewed and are otherwise negative except as noted above.    Blood pressure 160/70, pulse 62, height 5' (1.524 m), weight 129 lb 6.4 oz (58.695 kg).  General appearance: alert, cooperative, no distress and frail Neck: no carotid bruit and no JVD Lungs: clear to auscultation bilaterally Heart: regular rate and rhythm, S1, S2 normal, no murmur, click, rub or gallop Extremities: no LEE Pulses: 2+ and symmetric Skin: warm and dry Neurologic: Grossly  normal  EKG NSR. 61 bpm.   ASSESSMENT AND PLAN:   1. PAF - - NSR on EKG. HR is well controlled in the 60s on metoprolol. She denies symptoms of breakthrough atrial fibrillation. She wishes to only be on aspirin therapy only (h/o GIB).  2. CAD - s/p CABG x 4. No active chest pain/symptoms - Continue to manage medically. ASA, BB ACE and statin.   3. Diastolic HF - compensated at this time - no signs of swelling and her weight is stable. Continue current regimen but will add amlodipine for better BP control. Continue Lasix.   4. Back pain --  followed in the pain clinic.  5. HTN - BP up. Continue BB and ACE-i. There is no room to increase BB given low resting HR. We will add amlodipine, 5 mg daily. [facility] to monitor BP at home.   6. HLD - on Lipitor. Lipids not checked since 2015. Unfortunately, she is not fasting today. Patient given order for FLP + HFTs to obtain later.   7. CODP: recent hospitalization for acute COPD exacerbation. She is improved. Antibiotic course completed. No dyspnea. No wheezing on exam.   9. Tobacco abuse - says she is not smoking.   PLAN  Patient appears to be stable from a cardiac standpoint. The only medication change made today is the addition of amlodipine for better control of BP.  Instructions given for [facility] to monitor BP at home and to report any issues if they occur.  Lipid panel ordered + HFTs test to monitor lipids and hepatic function.  F/u with Dr. Shirlee Latch in 3 months.   Robbie Lis PA-C 04/19/2015 10:55 AM

## 2015-04-20 ENCOUNTER — Emergency Department: Payer: Medicare Other

## 2015-04-20 ENCOUNTER — Inpatient Hospital Stay
Admission: EM | Admit: 2015-04-20 | Discharge: 2015-04-22 | DRG: 378 | Disposition: A | Discharge status: Home Health | Payer: Medicare Other | Attending: Specialist | Admitting: Specialist

## 2015-04-20 ENCOUNTER — Encounter: Payer: Self-pay | Admitting: Emergency Medicine

## 2015-04-20 ENCOUNTER — Ambulatory Visit: Payer: Medicare Other | Admitting: Nurse Practitioner

## 2015-04-20 DIAGNOSIS — Z882 Allergy status to sulfonamides status: Secondary | ICD-10-CM

## 2015-04-20 DIAGNOSIS — M549 Dorsalgia, unspecified: Secondary | ICD-10-CM | POA: Diagnosis present

## 2015-04-20 DIAGNOSIS — M810 Age-related osteoporosis without current pathological fracture: Secondary | ICD-10-CM | POA: Diagnosis present

## 2015-04-20 DIAGNOSIS — Z79899 Other long term (current) drug therapy: Secondary | ICD-10-CM | POA: Diagnosis not present

## 2015-04-20 DIAGNOSIS — E119 Type 2 diabetes mellitus without complications: Secondary | ICD-10-CM | POA: Diagnosis present

## 2015-04-20 DIAGNOSIS — G2581 Restless legs syndrome: Secondary | ICD-10-CM | POA: Diagnosis present

## 2015-04-20 DIAGNOSIS — K922 Gastrointestinal hemorrhage, unspecified: Secondary | ICD-10-CM | POA: Diagnosis present

## 2015-04-20 DIAGNOSIS — D649 Anemia, unspecified: Secondary | ICD-10-CM | POA: Diagnosis present

## 2015-04-20 DIAGNOSIS — I480 Paroxysmal atrial fibrillation: Secondary | ICD-10-CM | POA: Diagnosis present

## 2015-04-20 DIAGNOSIS — K4020 Bilateral inguinal hernia, without obstruction or gangrene, not specified as recurrent: Secondary | ICD-10-CM | POA: Diagnosis present

## 2015-04-20 DIAGNOSIS — Z951 Presence of aortocoronary bypass graft: Secondary | ICD-10-CM

## 2015-04-20 DIAGNOSIS — J9610 Chronic respiratory failure, unspecified whether with hypoxia or hypercapnia: Secondary | ICD-10-CM | POA: Diagnosis present

## 2015-04-20 DIAGNOSIS — E1149 Type 2 diabetes mellitus with other diabetic neurological complication: Secondary | ICD-10-CM | POA: Diagnosis present

## 2015-04-20 DIAGNOSIS — M1990 Unspecified osteoarthritis, unspecified site: Secondary | ICD-10-CM | POA: Diagnosis present

## 2015-04-20 DIAGNOSIS — Z7951 Long term (current) use of inhaled steroids: Secondary | ICD-10-CM | POA: Diagnosis not present

## 2015-04-20 DIAGNOSIS — M069 Rheumatoid arthritis, unspecified: Secondary | ICD-10-CM | POA: Diagnosis present

## 2015-04-20 DIAGNOSIS — F419 Anxiety disorder, unspecified: Secondary | ICD-10-CM | POA: Diagnosis present

## 2015-04-20 DIAGNOSIS — Z880 Allergy status to penicillin: Secondary | ICD-10-CM

## 2015-04-20 DIAGNOSIS — I252 Old myocardial infarction: Secondary | ICD-10-CM

## 2015-04-20 DIAGNOSIS — K625 Hemorrhage of anus and rectum: Secondary | ICD-10-CM | POA: Diagnosis present

## 2015-04-20 DIAGNOSIS — Z7982 Long term (current) use of aspirin: Secondary | ICD-10-CM | POA: Diagnosis not present

## 2015-04-20 DIAGNOSIS — I728 Aneurysm of other specified arteries: Secondary | ICD-10-CM | POA: Diagnosis present

## 2015-04-20 DIAGNOSIS — I2510 Atherosclerotic heart disease of native coronary artery without angina pectoris: Secondary | ICD-10-CM | POA: Diagnosis present

## 2015-04-20 DIAGNOSIS — K5731 Diverticulosis of large intestine without perforation or abscess with bleeding: Principal | ICD-10-CM | POA: Diagnosis present

## 2015-04-20 DIAGNOSIS — K760 Fatty (change of) liver, not elsewhere classified: Secondary | ICD-10-CM | POA: Diagnosis present

## 2015-04-20 DIAGNOSIS — I714 Abdominal aortic aneurysm, without rupture: Secondary | ICD-10-CM | POA: Diagnosis present

## 2015-04-20 DIAGNOSIS — Z79891 Long term (current) use of opiate analgesic: Secondary | ICD-10-CM

## 2015-04-20 DIAGNOSIS — Z888 Allergy status to other drugs, medicaments and biological substances status: Secondary | ICD-10-CM

## 2015-04-20 DIAGNOSIS — Z96611 Presence of right artificial shoulder joint: Secondary | ICD-10-CM | POA: Diagnosis present

## 2015-04-20 DIAGNOSIS — Z885 Allergy status to narcotic agent status: Secondary | ICD-10-CM

## 2015-04-20 DIAGNOSIS — Z9981 Dependence on supplemental oxygen: Secondary | ICD-10-CM | POA: Diagnosis not present

## 2015-04-20 DIAGNOSIS — J449 Chronic obstructive pulmonary disease, unspecified: Secondary | ICD-10-CM | POA: Diagnosis present

## 2015-04-20 DIAGNOSIS — G894 Chronic pain syndrome: Secondary | ICD-10-CM | POA: Diagnosis present

## 2015-04-20 DIAGNOSIS — I110 Hypertensive heart disease with heart failure: Secondary | ICD-10-CM | POA: Diagnosis present

## 2015-04-20 DIAGNOSIS — E785 Hyperlipidemia, unspecified: Secondary | ICD-10-CM | POA: Diagnosis present

## 2015-04-20 DIAGNOSIS — F329 Major depressive disorder, single episode, unspecified: Secondary | ICD-10-CM | POA: Diagnosis present

## 2015-04-20 DIAGNOSIS — D72829 Elevated white blood cell count, unspecified: Secondary | ICD-10-CM | POA: Diagnosis present

## 2015-04-20 DIAGNOSIS — I5032 Chronic diastolic (congestive) heart failure: Secondary | ICD-10-CM | POA: Diagnosis present

## 2015-04-20 DIAGNOSIS — T380X5A Adverse effect of glucocorticoids and synthetic analogues, initial encounter: Secondary | ICD-10-CM | POA: Diagnosis present

## 2015-04-20 DIAGNOSIS — Z66 Do not resuscitate: Secondary | ICD-10-CM | POA: Diagnosis present

## 2015-04-20 HISTORY — DX: Heart failure, unspecified: I50.9

## 2015-04-20 LAB — COMPREHENSIVE METABOLIC PANEL
ALBUMIN: 3.2 g/dL — AB (ref 3.5–5.0)
ALT: 17 U/L (ref 14–54)
AST: 12 U/L — AB (ref 15–41)
Alkaline Phosphatase: 55 U/L (ref 38–126)
Anion gap: 7 (ref 5–15)
BUN: 26 mg/dL — AB (ref 6–20)
CHLORIDE: 103 mmol/L (ref 101–111)
CO2: 28 mmol/L (ref 22–32)
CREATININE: 0.77 mg/dL (ref 0.44–1.00)
Calcium: 8.6 mg/dL — ABNORMAL LOW (ref 8.9–10.3)
GFR calc Af Amer: >60 mL/min (ref >60)
GFR calc non Af Amer: >60 mL/min (ref >60)
Glucose, Bld: 165 mg/dL — ABNORMAL HIGH (ref 65–99)
Potassium: 3.7 mmol/L (ref 3.5–5.1)
SODIUM: 138 mmol/L (ref 135–145)
Total Bilirubin: 0.4 mg/dL (ref 0.3–1.2)
Total Protein: 6.7 g/dL (ref 6.5–8.1)

## 2015-04-20 LAB — CBC
HEMATOCRIT: 37.8 % (ref 35.0–47.0)
Hemoglobin: 12.3 g/dL (ref 12.0–16.0)
MCH: 28.7 pg (ref 26.0–34.0)
MCHC: 32.5 g/dL (ref 32.0–36.0)
MCV: 88.3 fL (ref 80.0–100.0)
PLATELETS: 236 10*3/uL (ref 150–440)
RBC: 4.28 MIL/uL (ref 3.80–5.20)
RDW: 14.2 % (ref 11.5–14.5)
WBC: 19 10*3/uL — ABNORMAL HIGH (ref 3.6–11.0)

## 2015-04-20 LAB — TYPE AND SCREEN
ABO/RH(D): A POS
Antibody Screen: NEGATIVE

## 2015-04-20 LAB — ABO/RH: ABO/RH(D): A POS

## 2015-04-20 LAB — HEMOGLOBIN: Hemoglobin: 11.8 g/dL — ABNORMAL LOW (ref 12.0–16.0)

## 2015-04-20 MED ORDER — TIOTROPIUM BROMIDE MONOHYDRATE 18 MCG IN CAPS
18.0000 ug | ORAL_CAPSULE | Freq: Every day | RESPIRATORY_TRACT | Status: DC
  Administered 2015-04-21 – 2015-04-22 (×2): 18 ug via RESPIRATORY_TRACT
  Filled 2015-04-20: qty 5

## 2015-04-20 MED ORDER — RAMIPRIL 10 MG PO CAPS
10.0000 mg | ORAL_CAPSULE | Freq: Every day | ORAL | Status: DC
  Administered 2015-04-20 – 2015-04-21 (×2): 10 mg via ORAL
  Filled 2015-04-20 (×3): qty 1

## 2015-04-20 MED ORDER — OXYCODONE HCL 5 MG PO TABS
15.0000 mg | ORAL_TABLET | Freq: Once | ORAL | Status: AC
  Administered 2015-04-20: 15 mg via ORAL
  Filled 2015-04-20: qty 3

## 2015-04-20 MED ORDER — SODIUM CHLORIDE 0.9 % IV SOLN: INTRAVENOUS | Status: DC

## 2015-04-20 MED ORDER — ALBUTEROL SULFATE (2.5 MG/3ML) 0.083% IN NEBU: 2.5000 mg | INHALATION_SOLUTION | RESPIRATORY_TRACT | Status: DC | PRN

## 2015-04-20 MED ORDER — OXYCODONE HCL 5 MG PO TABS
30.0000 mg | ORAL_TABLET | ORAL | Status: DC | PRN
  Administered 2015-04-21 – 2015-04-22 (×3): 30 mg via ORAL
  Filled 2015-04-20 (×3): qty 6

## 2015-04-20 MED ORDER — METOPROLOL TARTRATE 25 MG PO TABS
ORAL_TABLET | ORAL | Status: AC
  Administered 2015-04-20: 25 mg via ORAL
  Filled 2015-04-20: qty 1

## 2015-04-20 MED ORDER — SODIUM CHLORIDE 0.9 % IV BOLUS (SEPSIS)
500.0000 mL | Freq: Once | INTRAVENOUS | Status: AC
  Administered 2015-04-20: 500 mL via INTRAVENOUS

## 2015-04-20 MED ORDER — ATORVASTATIN CALCIUM 20 MG PO TABS
40.0000 mg | ORAL_TABLET | Freq: Every day | ORAL | Status: DC
  Administered 2015-04-20 – 2015-04-22 (×3): 40 mg via ORAL
  Filled 2015-04-20 (×3): qty 2

## 2015-04-20 MED ORDER — AMLODIPINE BESYLATE 5 MG PO TABS
5.0000 mg | ORAL_TABLET | Freq: Every day | ORAL | Status: DC
  Administered 2015-04-21 – 2015-04-22 (×2): 5 mg via ORAL
  Filled 2015-04-20 (×2): qty 1

## 2015-04-20 MED ORDER — MOMETASONE FURO-FORMOTEROL FUM 200-5 MCG/ACT IN AERO
2.0000 | INHALATION_SPRAY | Freq: Two times a day (BID) | RESPIRATORY_TRACT | Status: DC
  Administered 2015-04-21 – 2015-04-22 (×3): 2 via RESPIRATORY_TRACT
  Filled 2015-04-20: qty 8.8

## 2015-04-20 MED ORDER — ACETAMINOPHEN 650 MG RE SUPP: 650.0000 mg | Freq: Four times a day (QID) | RECTAL | Status: DC | PRN

## 2015-04-20 MED ORDER — MELATONIN 5 MG PO TABS: 5.0000 mg | ORAL_TABLET | Freq: Every evening | ORAL | Status: DC | PRN

## 2015-04-20 MED ORDER — MORPHINE SULFATE ER 15 MG PO TBCR
15.0000 mg | EXTENDED_RELEASE_TABLET | Freq: Two times a day (BID) | ORAL | Status: DC
  Administered 2015-04-21 – 2015-04-22 (×3): 15 mg via ORAL
  Filled 2015-04-20 (×3): qty 1

## 2015-04-20 MED ORDER — RISAQUAD PO CAPS
1.0000 | ORAL_CAPSULE | Freq: Every day | ORAL | Status: DC
  Administered 2015-04-21 – 2015-04-22 (×2): 1 via ORAL
  Filled 2015-04-20 (×2): qty 1

## 2015-04-20 MED ORDER — METOPROLOL TARTRATE 50 MG PO TABS
50.0000 mg | ORAL_TABLET | Freq: Two times a day (BID) | ORAL | Status: DC
  Administered 2015-04-21 – 2015-04-22 (×3): 50 mg via ORAL
  Filled 2015-04-20 (×3): qty 1

## 2015-04-20 MED ORDER — IOHEXOL 350 MG/ML SOLN
100.0000 mL | Freq: Once | INTRAVENOUS | Status: AC | PRN
  Administered 2015-04-20: 100 mL via INTRAVENOUS

## 2015-04-20 MED ORDER — SODIUM CHLORIDE 0.9 % IV BOLUS (SEPSIS): 500.0000 mL | Freq: Once | INTRAVENOUS | Status: DC

## 2015-04-20 MED ORDER — ACETAMINOPHEN 325 MG PO TABS: 650.0000 mg | ORAL_TABLET | Freq: Four times a day (QID) | ORAL | Status: DC | PRN

## 2015-04-20 MED ORDER — IOHEXOL 240 MG/ML SOLN
25.0000 mL | Freq: Once | INTRAMUSCULAR | Status: AC | PRN
  Administered 2015-04-20: 25 mL via ORAL

## 2015-04-20 MED ORDER — NITROGLYCERIN 0.4 MG SL SUBL: 0.4000 mg | SUBLINGUAL_TABLET | SUBLINGUAL | Status: DC | PRN

## 2015-04-20 MED ORDER — TIZANIDINE HCL 4 MG PO TABS: 2.0000 mg | ORAL_TABLET | Freq: Every evening | ORAL | Status: DC | PRN

## 2015-04-20 MED ORDER — PANTOPRAZOLE SODIUM 40 MG IV SOLR
40.0000 mg | Freq: Two times a day (BID) | INTRAVENOUS | Status: DC
  Administered 2015-04-20 – 2015-04-22 (×4): 40 mg via INTRAVENOUS
  Filled 2015-04-20 (×4): qty 40

## 2015-04-20 MED ORDER — METOPROLOL TARTRATE 25 MG PO TABS
25.0000 mg | ORAL_TABLET | Freq: Once | ORAL | Status: AC
  Administered 2015-04-20: 25 mg via ORAL

## 2015-04-20 NOTE — ED Provider Notes (Signed)
[facility] Emergency Department Provider Note  ____________________________________________  Time seen: Approximately 5:14 PM  I have reviewed the triage vital signs and the nursing notes.   HISTORY  Chief Complaint Rectal Bleeding    HPI [name] is a 82 y.o. female with hypertension, coronary artery disease, history of diverticular GI bleed, COPD with chronic home oxygen requirement, paroxysmal atrial fibrillation who presents for evaluation of bright red blood per rectum today gradual onset, constant since onset, currently moderate, no modifying factors. She reports she has had this in the past. She is also complaining of some lower abdominal cramping. No vomiting, diarrhea, fevers or chills. No chest pain. No increase in shortness of breath from baseline.   Past Medical History  Diagnosis Date  . Hypertension 08/01/1995  . Rheumatoid arthritis(714.0) 04/03/1983  . Osteoarthritis 04/03/1983  . Anemia, deficiency 07/01/2004    (w/u by Dr. Lorre Nick)  . Diabetes mellitus type II 07/31/1997  . Osteoporosis 12/31/2000  . CAD (coronary artery disease) 09/01/2002    a. s/p NSTEMI 2012. b. s/p CABG 01/2012.  [name] Hyperlipemia 07/02/1999  . COPD (chronic obstructive pulmonary disease) (HCC) 05/31/2004  . diverticulosis of colon   . Arteriovenous malformation of gastrointestinal tract   . Fatty liver   . Chronic back pain   . Splenic artery aneurysm (HCC)   . Anxiety and depression   . GI bleed     a. 04/2011 felt diverticular requiring microembolization by vascular.  . Adrenal mass (HCC)     a. Excision 2007.  [name] Restless leg syndrome   . C. difficile colitis     a. 04/2008 = severe.  [name] Hx of CABG 04/18/2012  . PAF (paroxysmal atrial fibrillation) (HCC) 12/16/2013  . 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 Chronic back pain greater than 3 months duration 11/30/2014  . CHF (congestive heart failure) [facility])     Patient Active  Problem List   Diagnosis Date Noted  . Chronic respiratory failure with hypoxia (HCC) 04/14/2015  . COPD exacerbation (HCC) 04/05/2015  . Chronic back pain greater than 3 months duration 11/30/2014  . Pedal edema 08/31/2014  . Dyspnea 08/31/2014  . 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 PAF (paroxysmal atrial fibrillation) (HCC) 12/16/2013  . History of GI diverticular bleed 09/25/2012  . Depression 06/17/2012  . Hx of CABG 04/18/2012  . Acute on chronic diastolic CHF (congestive heart failure) (HCC) 02/05/2012  . Splenic artery aneurysm (HCC)   . History of non-ST elevation myocardial infarction (NSTEMI) 07/22/2011  . GIB (gastrointestinal bleeding) 04/25/2011  . Smoking 02/09/2011  . AAA (abdominal aortic aneurysm) (HCC) 12/22/2010  . DIVERTICULOSIS OF COLON 05/14/2008  . ANXIETY DEPRESSION 04/19/2008  . ECZEMA 07/21/2007  . ARTERIOVENOUS MALFORMATION, GASTRIC 11/08/2006  . INSOMNIA, CHRONIC 07/21/2006  . ANEMIA-IRON DEFICIENCY 07/01/2004  . 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 CAD (coronary artery disease) 09/01/2002  . FATTY LIVER DISEASE 07/16/2002  . OSTEOPOROSIS 12/31/2000  . HYPERLIPIDEMIA 07/02/1999  . 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 Essential hypertension, benign 08/01/1995  . Rheumatoid arthritis(714.0) 04/03/1983  . OSTEOARTHRITIS 04/03/1983    Past Surgical History  Procedure Laterality Date  . Nasal sinus surgery    . Inguinal hernia repair    . Partial hysterectomy      ovaries intact, dysmennorhea w/ A/P repari  . Carpal tunnel release      bilat  . Trigger finger release  right  . Wrist surgery      neuroma repair, right  . Ankle surgery      neuroma repair, right ORIF  . Total shoulder replacement  12/13/1998    right, ([place])  . Rotator cuff repair  11/22/2003    ([place]) left  . Epidural block injection  05/2005    x 3  . Long finger tendon realignment  10/31/2005     [place]  . Lumbar epidural  06/13/2006  . Laminectomy  06/2006    L4/5 spinal stenosis (Dr. [name])  . Laminectomy  02/24/2008    L3/4 Ant/Lat interbody fusion and Lat Artthrodesis w/plate using XLIF (Dr. [name])  . Colonic embolization  04/18/2011    Dr. Wyn Quaker, acute GI bleed  . Spinal cord stimulator implant    . Coronary artery bypass graft  01/22/2012    Procedure: CORONARY ARTERY BYPASS GRAFTING (CABG);  Surgeon: Delight Ovens, MD;  Location: [facility] OR;  Service: Open Heart Surgery;  Laterality: N/A;  Coronary Artery Bypass Graft times four utilizing the left internal mammary artery and the left greater saphenous vein harvested endoscopically.  Rhae Hammock without cardioversion  01/22/2012    Procedure: TRANSESOPHAGEAL ECHOCARDIOGRAM (TEE);  Surgeon: Delight Ovens, MD;  Location: [facility] OR;  Service: Open Heart Surgery;  Laterality: N/A;    Current Outpatient Rx  Name  Route  Sig  Dispense  Refill  . acidophilus (RISAQUAD) CAPS capsule   Oral   Take 1 capsule by mouth daily.         [name] albuterol (PROVENTIL HFA;VENTOLIN HFA) 108 (90 Base) MCG/ACT inhaler   Inhalation   Inhale 2 puffs into the lungs every 6 (six) hours as needed for wheezing or shortness of breath.         [name] albuterol (PROVENTIL) (2.5 MG/3ML) 0.083% nebulizer solution   Nebulization   Take 2.5 mg by nebulization every 4 (four) hours as needed for wheezing or shortness of breath.         [name] amLODipine (NORVASC) 5 MG tablet   Oral   Take 1 tablet (5 mg total) by mouth daily.   30 tablet   5   . aspirin EC 81 MG tablet   Oral   Take 1 tablet (81 mg total) by mouth daily.   90 tablet   3   . atorvastatin (LIPITOR) 40 MG tablet   Oral   Take 1 tablet (40 mg total) by mouth daily.   30 tablet   5   . docusate sodium (COLACE) 100 MG capsule   Oral   Take 100 mg by mouth 2 (two) times daily as needed for mild constipation.         . Fluticasone-Salmeterol (ADVAIR DISKUS) 500-50 MCG/DOSE AEPB   Inhalation    Inhale 1 puff into the lungs 2 (two) times daily.   60 each   11   . Melatonin 5 MG TABS   Oral   Take 5 mg by mouth at bedtime as needed (for sleep).          . metoprolol (LOPRESSOR) 50 MG tablet   Oral   Take 1 tablet (50 mg total) by mouth 2 (two) times daily.   60 tablet   5   . morphine (MS CONTIN) 15 MG 12 hr tablet   Oral   Take 15 mg by mouth every 12 (twelve) hours.         [name] morphine (MS CONTIN) 30 MG 12 hr tablet  Oral   Take 30 mg by mouth every 12 (twelve) hours.         . nitroGLYCERIN (NITROSTAT) 0.4 MG SL tablet   Sublingual   Place 0.4 mg under the tongue every 5 (five) minutes as needed for chest pain.         . Omega-3 Fatty Acids (FISH OIL) 1000 MG CAPS   Oral   Take 1,000 mg by mouth daily.          [name] oxyCODONE (OXYCONTIN) 30 MG 12 hr tablet   Oral   Take 30 mg by mouth at bedtime.          . ramipril (ALTACE) 5 MG capsule   Oral   Take 2 capsules (10 mg total) by mouth at bedtime.   60 capsule   5   . tiotropium (SPIRIVA) 18 MCG inhalation capsule   Inhalation   Place 1 capsule (18 mcg total) into inhaler and inhale daily.   30 capsule   12   . tiZANidine (ZANAFLEX) 4 MG tablet   Oral   Take 2-4 mg by mouth at bedtime as needed for muscle spasms.         [name] VITAMIN E PO   Oral   Take 1 capsule by mouth daily.            Allergies Other; Penicillins; Sulfa antibiotics; Morphine sulfate; Macrolides and ketolides; Iron; and Naproxen  Family History  Problem Relation Age of Onset  . Hypotension Mother   . Dementia Mother   . Alcohol abuse Father   . Heart disease Father     MI, enlarged heart  . Diabetes Brother     Social History Social History  Substance Use Topics  . Smoking status: Former Smoker -- 0.10 packs/day for 50 years    Types: Cigarettes    Quit date: 12/18/2014  . Smokeless tobacco: Former Neurosurgeon  . Alcohol Use: 0.0 oz/week    0 Standard drinks or equivalent per week     Comment: red wine  occassionally    Review of Systems Constitutional: No fever/chills Eyes: No visual changes. ENT: No sore throat. Cardiovascular: Denies chest pain. Respiratory: Denies shortness of breath. Gastrointestinal: + abdominal pain.  No nausea, no vomiting.  No diarrhea.  No constipation. Genitourinary: Negative for dysuria. Musculoskeletal: Negative for back pain. Skin: Negative for rash. Neurological: Negative for headaches, focal weakness or numbness.  10-point ROS otherwise negative.  ____________________________________________   PHYSICAL EXAM:  VITAL SIGNS: ED Triage Vitals  Enc Vitals Group     BP 04/20/15 1413 170/75 mmHg     Pulse Rate 04/20/15 1413 100     Resp 04/20/15 1413 20     Temp 04/20/15 1413 98.2 F (36.8 C)     Temp Source 04/20/15 1413 Oral     SpO2 04/20/15 1413 95 %     Weight 04/20/15 1413 129 lb (58.514 kg)     Height 04/20/15 1413 5' (1.524 m)     Head Cir --      Peak Flow --      Pain Score 04/20/15 1414 0     Pain Loc --      Pain Edu? --      Excl. in GC? --     Constitutional: Alert and oriented. Chronically ill-appearing but in no acute distress. Eyes: Conjunctivae are normal. PERRL. EOMI. Head: Atraumatic. Nose: No congestion/rhinnorhea. Mouth/Throat: Mucous membranes are moist.  Oropharynx non-erythematous. Neck: No stridor.   Cardiovascular:  Normal rate, regular rhythm. Grossly normal heart sounds.  Good peripheral circulation. Respiratory: Normal respiratory effort.  No retractions. Lungs CTAB. Gastrointestinal: Soft with mild lower abdominal tenderness to palpation. No CVA tenderness. Genitourinary: deferred Rectal: Bright blood in the rectal vault is strongly guaiac positive. Musculoskeletal: No lower extremity tenderness nor edema.  No joint effusions. Neurologic:  Normal speech and language. No gross focal neurologic deficits are appreciated. No gait instability. Skin:  Skin is warm, dry and intact. No rash noted. Psychiatric:  Mood and affect are normal. Speech and behavior are normal.  ____________________________________________   LABS (all labs ordered are listed, but only abnormal results are displayed)  Labs Reviewed  COMPREHENSIVE METABOLIC PANEL - Abnormal; Notable for the following:    Glucose, Bld 165 (*)    BUN 26 (*)    Calcium 8.6 (*)    Albumin 3.2 (*)    AST 12 (*)    All other components within normal limits  CBC - Abnormal; Notable for the following:    WBC 19.0 (*)    All other components within normal limits  PROTIME-INR  APTT  TYPE AND SCREEN  ABO/RH   ____________________________________________  EKG  ED ECG REPORT I, Gayla Doss, the attending physician, personally viewed and interpreted this ECG.   Date: 04/20/2015  EKG Time: 19:52  Rate: 109  Rhythm: sinus tachycardia  Axis: normal  Intervals:Delacruz  ST&T Change: No acute ST elevation. LVH with repolarization abnormality. Baseline wander.  ____________________________________________  RADIOLOGY  CT abdomen and pelvis IMPRESSION: 1. Diverticulosis involving the descending and sigmoid colon without evidence of diverticulitis or focal bowel lesions. 2. Stable 13 mm calcified distal splenic artery aneurysm at the splenic hilum. 3. Stable low-density right adrenal nodule consistent with adenoma. 4. Bilateral inguinal hernias with the right inguinal hernia containing the appendix. ____________________________________________   PROCEDURES  Procedure(s) performed: Delacruz  Critical Care performed: No  ____________________________________________   INITIAL IMPRESSION / ASSESSMENT AND PLAN / ED COURSE  Pertinent labs & imaging results that were available during my care of the patient were reviewed by me and considered in my medical decision making (see chart for details).  ABBIGAEL DETLEFSEN is a 82 y.o. female with hypertension, coronary artery disease, history of diverticular GI bleed, COPD with chronic home  oxygen requirement, paroxysmal atrial fibrillation who presents for evaluation of bright red blood per rectum today. On exam, she is nontoxic appearing she does appear chronically ill. She has bright blood per rectum. Mildly tachycardic but maintaining adequate blood pressure. Maintaining adequate 02 sat on chronic home oxygen requirement. Labs reviewed. CMP is generally unremarkable. CBC was notable for leukocytosis. CT of the abdomen and pelvis shows diverticulosis, no other abnormalities. My concern is for possible recurrent diverticular bleed. Case discussed with hospitalist for admission at 8:15 pm. ____________________________________________   FINAL CLINICAL IMPRESSION(S) / ED DIAGNOSES  Final diagnoses:  Lower GI bleed      Gayla Doss, MD 04/20/15 2041

## 2015-04-20 NOTE — ED Notes (Signed)
Patient transported to CT 

## 2015-04-20 NOTE — H&P (Signed)
[facility] - [place] [facility]   PATIENT NAME: [name]    MR#:  [id number]  DATE OF BIRTH:  Jun 26, 1931  DATE OF ADMISSION:  04/20/2015  PRIMARY CARE PHYSICIAN: Hannah Beat, MD   REQUESTING/REFERRING PHYSICIAN: Dr. Chari Manning  CHIEF COMPLAINT:   Chief Complaint  Patient presents with  . Rectal Bleeding    HISTORY OF PRESENT ILLNESS:  [name]  is a 82 y.o. female with a known history of chronic pain, hypertension end-stage COPD. She was recently in the hospital for COPD exacerbation. She presents with rectal bleeding. She was at home and doing thing she noticed a red spot of blood and then she took off her close and they were covered with blood. She states it still coming out now she had a pad on and the pad was full. She does have some abdominal pain in the center of her abdomen. She states the blood was bright red. The pad that was in the garbage can look like it was covered with darker blood but it may have been dried. Hospitalist services were contacted for further evaluation.  PAST MEDICAL HISTORY:   Past Medical History  Diagnosis Date  . Hypertension 08/01/1995  . Rheumatoid arthritis(714.0) 04/03/1983  . Osteoarthritis 04/03/1983  . Anemia, deficiency 07/01/2004    (w/u by Dr. Lorre Nick)  . Diabetes mellitus type II 07/31/1997  . Osteoporosis 12/31/2000  . CAD (coronary artery disease) 09/01/2002    a. s/p NSTEMI 2012. b. s/p CABG 01/2012.  [name] Hyperlipemia 07/02/1999  . COPD (chronic obstructive pulmonary disease) (HCC) 05/31/2004  . diverticulosis of colon   . Arteriovenous malformation of gastrointestinal tract   . Fatty liver   . Chronic back pain   . Splenic artery aneurysm (HCC)   . Anxiety and depression   . GI bleed     a. 04/2011 felt diverticular requiring microembolization by vascular.  . Adrenal mass (HCC)     a. Excision 2007.  [name] Restless leg syndrome   . C. difficile colitis     a. 04/2008 = severe.  [name] Hx  of CABG 04/18/2012  . PAF (paroxysmal atrial fibrillation) (HCC) 12/16/2013  . 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 Chronic back pain greater than 3 months duration 11/30/2014  . CHF (congestive heart failure) (HCC)     PAST SURGICAL HISTORY:   Past Surgical History  Procedure Laterality Date  . Nasal sinus surgery    . Inguinal hernia repair    . Partial hysterectomy      ovaries intact, dysmennorhea w/ A/P repari  . Carpal tunnel release      bilat  . Trigger finger release      right  . Wrist surgery      neuroma repair, right  . Ankle surgery      neuroma repair, right ORIF  . Total shoulder replacement  12/13/1998    right, ([place])  . Rotator cuff repair  11/22/2003    ([place]) left  . Epidural block injection  05/2005    x 3  . Long finger tendon realignment  10/31/2005    [place]  . Lumbar epidural  06/13/2006  . Laminectomy  06/2006    L4/5 spinal stenosis (Dr. [name])  . Laminectomy  02/24/2008    L3/4 Ant/Lat interbody fusion and Lat Artthrodesis w/plate using XLIF (Dr. [name])  . Colonic embolization  04/18/2011    Dr. Wyn Quaker, acute GI bleed  . Spinal cord stimulator implant    .  Coronary artery bypass graft  01/22/2012    Procedure: CORONARY ARTERY BYPASS GRAFTING (CABG);  Surgeon: Delight Ovens, MD;  Location: [facility] OR;  Service: Open Heart Surgery;  Laterality: N/A;  Coronary Artery Bypass Graft times four utilizing the left internal mammary artery and the left greater saphenous vein harvested endoscopically.  Rhae Hammock without cardioversion  01/22/2012    Procedure: TRANSESOPHAGEAL ECHOCARDIOGRAM (TEE);  Surgeon: Delight Ovens, MD;  Location: [facility] OR;  Service: Open Heart Surgery;  Laterality: N/A;    SOCIAL HISTORY:   Social History  Substance Use Topics  . Smoking status: Former Smoker -- 0.10 packs/day for 50 years    Types: Cigarettes    Quit date: 12/18/2014  . Smokeless tobacco: Former Neurosurgeon  . Alcohol Use: 0.0 oz/week    0  Standard drinks or equivalent per week     Comment: red wine occassionally    FAMILY HISTORY:   Family History  Problem Relation Age of Onset  . Hypotension Mother   . Dementia Mother   . Alcohol abuse Father   . Heart disease Father     MI, enlarged heart  . Diabetes Brother     DRUG ALLERGIES:   Allergies  Allergen Reactions  . Other Diarrhea, Rash and Other (See Comments)    Pt states that she is allergic to all -mycins.   Reaction: Sore tongue, raw mouth, and weakness  . Penicillins Other (See Comments)    Reaction:  Dizziness  Has patient had a PCN reaction causing immediate rash, facial/tongue/throat swelling, SOB or lightheadedness with hypotension: No Has patient had a PCN reaction causing severe rash involving mucus membranes or skin necrosis: No Has patient had a PCN reaction that required hospitalization No Has patient had a PCN reaction occurring within the last 10 years: No If all of the above answers are "NO", then may proceed with Cephalosporin use.  . Sulfa Antibiotics Shortness Of Breath  . Morphine Sulfate Nausea And Vomiting and Other (See Comments)    Reaction:  Hallucinations   . Macrolides And Ketolides Diarrhea and Nausea And Vomiting  . Iron Diarrhea  . Naproxen Rash    REVIEW OF SYSTEMS:  CONSTITUTIONAL: No fever, positive for fatigue.  EYES: No blurred or double vision.  EARS, NOSE, AND THROAT: No tinnitus or ear pain. No sore throat RESPIRATORY: No cough, positive for shortness of breath, some wheezing. No hemoptysis.  CARDIOVASCULAR: No chest pain, orthopnea, edema.  GASTROINTESTINAL: No nausea, vomiting. Positive abdominal pain. As per patient positive for bright red blood per rectum. GENITOURINARY: No dysuria, hematuria.  ENDOCRINE: No polyuria, nocturia,  HEMATOLOGY: No anemia, easy bruising or bleeding SKIN: No rash or lesion. MUSCULOSKELETAL: Positive joint pains and back pain  NEUROLOGIC: No tingling, numbness, weakness.   PSYCHIATRY: No anxiety or depression.   MEDICATIONS AT HOME:   Prior to Admission medications   Medication Sig Start Date End Date Taking? Authorizing Provider  acidophilus (RISAQUAD) CAPS capsule Take 1 capsule by mouth daily.   Yes Historical Provider, MD  albuterol (PROVENTIL HFA;VENTOLIN HFA) 108 (90 Base) MCG/ACT inhaler Inhale 2 puffs into the lungs every 6 (six) hours as needed for wheezing or shortness of breath.   Yes Historical Provider, MD  albuterol (PROVENTIL) (2.5 MG/3ML) 0.083% nebulizer solution Take 2.5 mg by nebulization every 4 (four) hours as needed for wheezing or shortness of breath.   Yes Historical Provider, MD  amLODipine (NORVASC) 5 MG tablet Take 1 tablet (5 mg total) by mouth daily. 04/19/15  Yes Brittainy Sherlynn Carbon, PA-C  aspirin EC 81 MG tablet Take 1 tablet (81 mg total) by mouth daily. 08/13/13  Yes [name], MD  atorvastatin (LIPITOR) 40 MG tablet Take 1 tablet (40 mg total) by mouth daily. 04/19/15  Yes Brittainy Sherlynn Carbon, PA-C  docusate sodium (COLACE) 100 MG capsule Take 100 mg by mouth 2 (two) times daily as needed for mild constipation.   Yes Historical Provider, MD  Fluticasone-Salmeterol (ADVAIR DISKUS) 500-50 MCG/DOSE AEPB Inhale 1 puff into the lungs 2 (two) times daily. 09/27/14  Yes Hannah Beat, MD  Melatonin 5 MG TABS Take 5 mg by mouth at bedtime as needed (for sleep).    Yes Historical Provider, MD  metoprolol (LOPRESSOR) 50 MG tablet Take 1 tablet (50 mg total) by mouth 2 (two) times daily. 04/19/15  Yes Brittainy Sherlynn Carbon, PA-C  morphine (MS CONTIN) 15 MG 12 hr tablet Take 15 mg by mouth every 12 (twelve) hours.   Yes Historical Provider, MD  morphine (MS CONTIN) 30 MG 12 hr tablet Take 30 mg by mouth every 12 (twelve) hours.   Yes Historical Provider, MD  nitroGLYCERIN (NITROSTAT) 0.4 MG SL tablet Place 0.4 mg under the tongue every 5 (five) minutes as needed for chest pain.   Yes Historical Provider, MD  Omega-3 Fatty Acids (FISH OIL)  1000 MG CAPS Take 1,000 mg by mouth daily.    Yes Historical Provider, MD  oxyCODONE (OXYCONTIN) 30 MG 12 hr tablet Take 30 mg by mouth at bedtime.    Yes Historical Provider, MD  ramipril (ALTACE) 5 MG capsule Take 2 capsules (10 mg total) by mouth at bedtime. 04/19/15  Yes Brittainy Sherlynn Carbon, PA-C  tiotropium (SPIRIVA) 18 MCG inhalation capsule Place 1 capsule (18 mcg total) into inhaler and inhale daily. 09/27/14  Yes Spencer Copland, MD  tiZANidine (ZANAFLEX) 4 MG tablet Take 2-4 mg by mouth at bedtime as needed for muscle spasms.   Yes Historical Provider, MD  VITAMIN E PO Take 1 capsule by mouth daily.    Yes Historical Provider, MD      VITAL SIGNS:  Blood pressure 170/75, pulse 100, temperature 98.2 F (36.8 C), temperature source Oral, resp. rate 20, height 5' (1.524 m), weight 58.514 kg (129 lb), SpO2 95 %.  PHYSICAL EXAMINATION:  GENERAL:  82 y.o.-year-old patient lying in the bed with no acute distress.  EYES: Pupils equal, round, reactive to light and accommodation. No scleral icterus. Extraocular muscles intact.  HEENT: Head atraumatic, normocephalic. Oropharynx and nasopharynx clear.  NECK:  Supple, no jugular venous distention. No thyroid enlargement, no tenderness.  LUNGS: Decreased breath sounds bilaterally, no wheezing, rales,rhonchi or crepitation. No use of accessory muscles of respiration.  CARDIOVASCULAR: S1, S2 tachycardic. No murmurs, rubs, or gallops.  ABDOMEN: Soft, slight tenderness generalized, nondistended. Bowel sounds present. No organomegaly or mass.  EXTREMITIES: No pedal edema, cyanosis, or clubbing.  NEUROLOGIC: Cranial nerves II through XII are intact. Muscle strength 5/5 in all extremities. Sensation intact. Gait not checked.  PSYCHIATRIC: The patient is alert and oriented x 3.  SKIN: No rash, lesion, or ulcer.   LABORATORY PANEL:   CBC  Recent Labs Lab 04/20/15 1417  WBC 19.0*  HGB 12.3  HCT 37.8  PLT 236    ------------------------------------------------------------------------------------------------------------------  Chemistries   Recent Labs Lab 04/20/15 1417  NA 138  K 3.7  CL 103  CO2 28  GLUCOSE 165*  BUN 26*  CREATININE 0.77  CALCIUM 8.6*  AST 12*  ALT 17  ALKPHOS 55  BILITOT 0.4   ------------------------------------------------------------------------------------------------------------------   RADIOLOGY:  Ct Abdomen Pelvis W Contrast  04/20/2015  CLINICAL DATA:  Abdominal pain and rectal bleeding. EXAM: CT ABDOMEN AND PELVIS WITH CONTRAST TECHNIQUE: Multidetector CT imaging of the abdomen and pelvis was performed using the standard protocol following bolus administration of intravenous contrast. CONTRAST:  OMNIPAQUE IOHEXOL 350 MG/ML SOLN COMPARISON:  05/30/2007 FINDINGS: There is diffuse diverticulosis involving the descending and sigmoid colon. No evidence to suggest diverticulitis. No focal lesion is identified within the bowel by CT. No evidence of small bowel dilatation. No free air, free fluid or abscess identified. The liver, gallbladder, pancreas and kidneys are unremarkable. There is a stable densely calcified distal splenic artery aneurysm in the splenic hilum measuring approximately 13 mm. Stable low density nodule of the right adrenal gland likely represents adenoma and measures 2 cm in greatest dimensions. Stable densely calcified abdominal aorta and iliac arteries without evidence of aneurysm. The bladder is unremarkable. Bilateral inguinal hernias are present. The right inguinal hernia contains a segment of the appendix. No evidence of appendicitis. Bony structures demonstrate interval lumbar fusion at the L3-4 and L4-5 levels. IMPRESSION: 1. Diverticulosis involving the descending and sigmoid colon without evidence of diverticulitis or focal bowel lesions. 2. Stable 13 mm calcified distal splenic artery aneurysm at the splenic hilum. 3. Stable low-density  right adrenal nodule consistent with adenoma. 4. Bilateral inguinal hernias with the right inguinal hernia containing the appendix. Electronically Signed   By: Irish Lack M.D.   On: 04/20/2015 19:23    EKG:   Sinus tachycardia 109 bpm, LVH  IMPRESSION AND PLAN:   1.  GI bleed. Patient states this is bright red blood per rectum. This is likely a diverticular bleed. If the patient continues to bleed a lot then can obtain a bleeding scan. I will get serial hemoglobins. Initial hemoglobin was the same as it was in the beginning of the month. We'll get a GI consult in the a.m. Clear liquid diet. Stop aspirin. 2. End-stage COPD with recent COPD exacerbation. Continue inhalers and nebulizers. 3. Leukocytosis likely high with steroids recently. 4. Chronic pain syndrome- patient is on a lot of pain medication. I will continue to order. 5. Accelerated hypertension- controlled pain. Give usual blood pressure medications. 6. Hyperlipidemia unspecified continue atorvastatin   All the records are reviewed and case discussed with ED provider. Management plans discussed with the patient, family and they are in agreement.  CODE STATUS: DO NOT RESUSCITATE  TOTAL TIME TAKING CARE OF THIS PATIENT: 50 minutes.    Alford Highland M.D on 04/20/2015 at 9:20 PM  Between 7am to 6pm - Pager - [phone]  After 6pm call admission pager [phone]  [facility]  Office  [phone]  CC: Primary care physician; Hannah Beat, MD

## 2015-04-20 NOTE — Progress Notes (Signed)
PHARMACIST - PHYSICIAN ORDER COMMUNICATION  CONCERNING: P&T Medication Policy on Herbal Medications  DESCRIPTION:  This patient's order for:  melatonin  has been noted.  This product(s) is classified as an "herbal" or natural product. Due to a lack of definitive safety studies or FDA approval, nonstandard manufacturing practices, plus the potential risk of unknown drug-drug interactions while on inpatient medications, the Pharmacy and Therapeutics Committee does not permit the use of "herbal" or natural products of this type within [place].   ACTION TAKEN: The pharmacy department is unable to verify this order at this time. Please reevaluate patient's clinical condition at discharge and address if the herbal or natural product(s) should be resumed at that time.   

## 2015-04-20 NOTE — ED Notes (Signed)
Assist pt to restroom

## 2015-04-20 NOTE — ED Notes (Signed)
Reports bm with bright red blood today.  Arrived to triage a&o, skin w/d with good color.  Pt on 2Ll O2

## 2015-04-20 NOTE — ED Notes (Signed)
MD at bedside. 

## 2015-04-21 LAB — CBC
HEMATOCRIT: 34.9 % — AB (ref 35.0–47.0)
HEMOGLOBIN: 11.3 g/dL — AB (ref 12.0–16.0)
MCH: 29.2 pg (ref 26.0–34.0)
MCHC: 32.3 g/dL (ref 32.0–36.0)
MCV: 90.4 fL (ref 80.0–100.0)
Platelets: 231 10*3/uL (ref 150–440)
RBC: 3.86 MIL/uL (ref 3.80–5.20)
RDW: 14.3 % (ref 11.5–14.5)
WBC: 20.4 10*3/uL — ABNORMAL HIGH (ref 3.6–11.0)

## 2015-04-21 LAB — PROTIME-INR
INR: 1.11
Prothrombin Time: 14.5 seconds (ref 11.4–15.0)

## 2015-04-21 LAB — HEMOGLOBIN
HEMOGLOBIN: 10.6 g/dL — AB (ref 12.0–16.0)
HEMOGLOBIN: 9.7 g/dL — AB (ref 12.0–16.0)

## 2015-04-21 LAB — BASIC METABOLIC PANEL
ANION GAP: 6 (ref 5–15)
BUN: 22 mg/dL — ABNORMAL HIGH (ref 6–20)
CALCIUM: 7.7 mg/dL — AB (ref 8.9–10.3)
CO2: 27 mmol/L (ref 22–32)
Chloride: 105 mmol/L (ref 101–111)
Creatinine, Ser: 0.54 mg/dL (ref 0.44–1.00)
GLUCOSE: 169 mg/dL — AB (ref 65–99)
POTASSIUM: 3.5 mmol/L (ref 3.5–5.1)
Sodium: 138 mmol/L (ref 135–145)

## 2015-04-21 LAB — APTT: aPTT: 28 seconds (ref 24–36)

## 2015-04-21 NOTE — Consult Note (Signed)
GI Inpatient Consult Note  Reason for Consult: GI Bleed   Attending Requesting Consult: Dr. Cherlynn Kaiser  History of Present Illness: [name] is a 82 y.o. female with a significant pmh of COPD, chronic pain, diverticular bleed with embolization in 04/2011 reports that she had an episode of painless rectal bleeding yesterday afternoon.  She was laying on her bed  And noticed some blood, when she went to the bathroom, her pants were soaked in blood.  She experienced a few more episodes and then presented to the ED.  Her episodes have seemed to resolve.  She did have a small amount of blood in her last episode this morning.  She reports she starting having a dull, constant ache in her umbilical area after arriving at the ED.    She denies any changes in her stools, nausea, vomiting.  She had a similar episode (but this one is not as bad) in 2013 that required multiple transfusions and embolization.  She had a f/u colonoscopy with Dr. Marva Panda in 06/2011 that showed multiple small and large mouthed tics in the entire colon, very severe in the sigmoid and descending.    She is on 2L O2 24/7.  She was recently hospitalized for COPD exacerbation, discharged 04/07/2015.    Past Medical History:  Past Medical History  Diagnosis Date  . Hypertension 08/01/1995  . Rheumatoid arthritis(714.0) 04/03/1983  . Osteoarthritis 04/03/1983  . Anemia, deficiency 07/01/2004    (w/u by Dr. Lorre Nick)  . Diabetes mellitus type II 07/31/1997  . Osteoporosis 12/31/2000  . CAD (coronary artery disease) 09/01/2002    a. s/p NSTEMI 2012. b. s/p CABG 01/2012.  [name] Hyperlipemia 07/02/1999  . COPD (chronic obstructive pulmonary disease) (HCC) 05/31/2004  . diverticulosis of colon   . Arteriovenous malformation of gastrointestinal tract   . Fatty liver   . Chronic back pain   . Splenic artery aneurysm (HCC)   . Anxiety and depression   . GI bleed     a. 04/2011 felt diverticular requiring microembolization by  vascular.  . Adrenal mass (HCC)     a. Excision 2007.  [name] Restless leg syndrome   . C. difficile colitis     a. 04/2008 = severe.  [name] Hx of CABG 04/18/2012  . PAF (paroxysmal atrial fibrillation) (HCC) 12/16/2013  . 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 Chronic back pain greater than 3 months duration 11/30/2014  . CHF (congestive heart failure) (HCC)     Problem List: Patient Active Problem List   Diagnosis Date Noted  . Rectal bleeding 04/20/2015  . Chronic respiratory failure with hypoxia (HCC) 04/14/2015  . COPD exacerbation (HCC) 04/05/2015  . Chronic back pain greater than 3 months duration 11/30/2014  . Pedal edema 08/31/2014  . Dyspnea 08/31/2014  . 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 PAF (paroxysmal atrial fibrillation) (HCC) 12/16/2013  . History of GI diverticular bleed 09/25/2012  . Depression 06/17/2012  . Hx of CABG 04/18/2012  . Acute on chronic diastolic CHF (congestive heart failure) (HCC) 02/05/2012  . Splenic artery aneurysm (HCC)   . History of non-ST elevation myocardial infarction (NSTEMI) 07/22/2011  . GIB (gastrointestinal bleeding) 04/25/2011  . Smoking 02/09/2011  . AAA (abdominal aortic aneurysm) (HCC) 12/22/2010  . DIVERTICULOSIS OF COLON 05/14/2008  . ANXIETY DEPRESSION 04/19/2008  . ECZEMA 07/21/2007  . ARTERIOVENOUS MALFORMATION, GASTRIC 11/08/2006  . INSOMNIA, CHRONIC 07/21/2006  . ANEMIA-IRON DEFICIENCY 07/01/2004  . 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  CAD (coronary artery disease) 09/01/2002  . FATTY LIVER DISEASE 07/16/2002  . OSTEOPOROSIS 12/31/2000  . HYPERLIPIDEMIA 07/02/1999  . 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 Essential hypertension, benign 08/01/1995  . Rheumatoid arthritis(714.0) 04/03/1983  . OSTEOARTHRITIS 04/03/1983    Past Surgical History: Past Surgical History  Procedure Laterality Date  . Nasal sinus surgery    . Inguinal  hernia repair    . Partial hysterectomy      ovaries intact, dysmennorhea w/ A/P repari  . Carpal tunnel release      bilat  . Trigger finger release      right  . Wrist surgery      neuroma repair, right  . Ankle surgery      neuroma repair, right ORIF  . Total shoulder replacement  12/13/1998    right, ([place])  . Rotator cuff repair  11/22/2003    ([place]) left  . Epidural block injection  05/2005    x 3  . Long finger tendon realignment  10/31/2005    [place]  . Lumbar epidural  06/13/2006  . Laminectomy  06/2006    L4/5 spinal stenosis (Dr. [name])  . Laminectomy  02/24/2008    L3/4 Ant/Lat interbody fusion and Lat Artthrodesis w/plate using XLIF (Dr. [name])  . Colonic embolization  04/18/2011    Dr. Wyn Quaker, acute GI bleed  . Spinal cord stimulator implant    . Coronary artery bypass graft  01/22/2012    Procedure: CORONARY ARTERY BYPASS GRAFTING (CABG);  Surgeon: Delight Ovens, MD;  Location: [facility] OR;  Service: Open Heart Surgery;  Laterality: N/A;  Coronary Artery Bypass Graft times four utilizing the left internal mammary artery and the left greater saphenous vein harvested endoscopically.  Rhae Hammock without cardioversion  01/22/2012    Procedure: TRANSESOPHAGEAL ECHOCARDIOGRAM (TEE);  Surgeon: Delight Ovens, MD;  Location[facility] OR;  Service: Open Heart Surgery;  Laterality: N/A;    Allergies: Allergies  Allergen Reactions  . Other Diarrhea, Rash and Other (See Comments)    Pt states that she is allergic to all -mycins.   Reaction: Sore tongue, raw mouth, and weakness  . Penicillins Other (See Comments)    Reaction:  Dizziness  Has patient had a PCN reaction causing immediate rash, facial/tongue/throat swelling, SOB or lightheadedness with hypotension: No Has patient had a PCN reaction causing severe rash involving mucus membranes or skin necrosis: No Has patient had a PCN reaction that required hospitalization No Has patient had a PCN reaction occurring within the last  10 years: No If all of the above answers are "NO", then may proceed with Cephalosporin use.  . Sulfa Antibiotics Shortness Of Breath  . Morphine Sulfate Nausea And Vomiting and Other (See Comments)    Reaction:  Hallucinations   . Macrolides And Ketolides Diarrhea and Nausea And Vomiting  . Iron Diarrhea  . Naproxen Rash    Home Medications: Prescriptions prior to admission  Medication Sig Dispense Refill Last Dose  . acidophilus (RISAQUAD) CAPS capsule Take 1 capsule by mouth daily.   04/20/2015 at Unknown time  . albuterol (PROVENTIL HFA;VENTOLIN HFA) 108 (90 Base) MCG/ACT inhaler Inhale 2 puffs into the lungs every 6 (six) hours as needed for wheezing or shortness of breath.   04/20/2015 at Unknown time  . albuterol (PROVENTIL) (2.5 MG/3ML) 0.083% nebulizer solution Take 2.5 mg by nebulization every 4 (four) hours as needed for wheezing or shortness of breath.   04/20/2015 at Unknown time  .  amLODipine (NORVASC) 5 MG tablet Take 1 tablet (5 mg total) by mouth daily. 30 tablet 5 04/20/2015 at Unknown time  . aspirin EC 81 MG tablet Take 1 tablet (81 mg total) by mouth daily. 90 tablet 3 04/20/2015 at 0700  . atorvastatin (LIPITOR) 40 MG tablet Take 1 tablet (40 mg total) by mouth daily. 30 tablet 5 04/20/2015 at Unknown time  . docusate sodium (COLACE) 100 MG capsule Take 100 mg by mouth 2 (two) times daily as needed for mild constipation.   04/19/2015 at Unknown time  . Fluticasone-Salmeterol (ADVAIR DISKUS) 500-50 MCG/DOSE AEPB Inhale 1 puff into the lungs 2 (two) times daily. 60 each 11 04/20/2015 at Unknown time  . Melatonin 5 MG TABS Take 5 mg by mouth at bedtime as needed (for sleep).    04/19/2015 at Unknown time  . metoprolol (LOPRESSOR) 50 MG tablet Take 1 tablet (50 mg total) by mouth 2 (two) times daily. 60 tablet 5 04/20/2015 at 0700  . morphine (MS CONTIN) 15 MG 12 hr tablet Take 15 mg by mouth every 12 (twelve) hours.   04/19/2015 at 2100  . morphine (MS CONTIN) 30 MG 12 hr tablet Take 30  mg by mouth every 12 (twelve) hours.   04/19/2015 at 2100  . nitroGLYCERIN (NITROSTAT) 0.4 MG SL tablet Place 0.4 mg under the tongue every 5 (five) minutes as needed for chest pain.   PRN at PRN  . Omega-3 Fatty Acids (FISH OIL) 1000 MG CAPS Take 1,000 mg by mouth daily.    04/20/2015 at Unknown time  . oxyCODONE (OXYCONTIN) 30 MG 12 hr tablet Take 30 mg by mouth at bedtime.    Past Week at Unknown time  . ramipril (ALTACE) 5 MG capsule Take 2 capsules (10 mg total) by mouth at bedtime. 60 capsule 5 04/19/2015 at Unknown time  . tiotropium (SPIRIVA) 18 MCG inhalation capsule Place 1 capsule (18 mcg total) into inhaler and inhale daily. 30 capsule 12 04/20/2015 at Unknown time  . tiZANidine (ZANAFLEX) 4 MG tablet Take 2-4 mg by mouth at bedtime as needed for muscle spasms.   04/19/2015 at Unknown time  . VITAMIN E PO Take 1 capsule by mouth daily.    04/20/2015 at Unknown time   Home medication reconciliation was completed with the patient.   Scheduled Inpatient Medications:   . acidophilus  1 capsule Oral Daily  . amLODipine  5 mg Oral Daily  . atorvastatin  40 mg Oral Daily  . metoprolol  50 mg Oral BID  . mometasone-formoterol  2 puff Inhalation BID  . morphine  15 mg Oral Q12H  . pantoprazole (PROTONIX) IV  40 mg Intravenous Q12H  . ramipril  10 mg Oral QHS  . sodium chloride  500 mL Intravenous Once  . tiotropium  18 mcg Inhalation Daily    Continuous Inpatient Infusions:   . sodium chloride      PRN Inpatient Medications:  acetaminophen **OR** acetaminophen, albuterol, nitroGLYCERIN, oxyCODONE, tiZANidine  Family History: family history includes Alcohol abuse in her father; Dementia in her mother; Diabetes in her brother; Heart disease in her father; Hypotension in her mother.  The patient's family history is negative for inflammatory bowel disorders, GI malignancy, or solid organ transplantation.  Social History:   reports that she quit smoking about 4 months ago. Her smoking  use included Cigarettes. She has a 5 pack-year smoking history. She has quit using smokeless tobacco. She reports that she drinks alcohol. She reports that she does  not use illicit drugs.   Review of Systems: Constitutional: Weight is stable.  Eyes: No changes in vision. ENT: No oral lesions, sore throat.  GI: see HPI.  Heme/Lymph: No easy bruising.  CV: No chest pain.  GU: No hematuria.  Integumentary: No rashes.  Neuro: No headaches.  Psych: No depression/anxiety.  Endocrine: No heat/cold intolerance.  Allergic/Immunologic: No urticaria.  Resp: Positive for cough, SOB.  Musculoskeletal: No joint swelling.    Physical Examination: BP 110/46 mmHg  Pulse 53  Temp(Src) 98.6 F (37 C) (Oral)  Resp 18  Ht 5' (1.524 m)  Wt 58.514 kg (129 lb)  BMI 25.19 kg/m2  SpO2 99% Gen: NAD, alert and oriented x 4, very pleasant female HEENT: PEERLA, EOMI, Neck: supple, no JVD or thyromegaly Chest: CTA bilaterally, no wheezes, crackles, or other adventitious sounds CV: RRR, no m/g/c/r Abd: soft, slight mid abdomen tenderness, ND, +BS in all four quadrants; no HSM, guarding, ridigity, or rebound tenderness Ext: no edema, well perfused with 2+ pulses, Skin: no rash or lesions noted Lymph: no LAD  Data: Lab Results  Component Value Date   WBC 20.4* 04/21/2015   HGB 11.3* 04/21/2015   HCT 34.9* 04/21/2015   MCV 90.4 04/21/2015   PLT 231 04/21/2015    Recent Labs Lab 04/20/15 1417 04/20/15 2136 04/21/15 0129  HGB 12.3 11.8* 11.3*   Lab Results  Component Value Date   NA 138 04/21/2015   K 3.5 04/21/2015   CL 105 04/21/2015   CO2 27 04/21/2015   BUN 22* 04/21/2015   CREATININE 0.54 04/21/2015   Lab Results  Component Value Date   ALT 17 04/20/2015   AST 12* 04/20/2015   ALKPHOS 55 04/20/2015   BILITOT 0.4 04/20/2015    Recent Labs Lab 04/21/15 0129  APTT 28  INR 1.11    Imaging: CLINICAL DATA: Abdominal pain and rectal bleeding.  EXAM: CT ABDOMEN AND PELVIS  WITH CONTRAST  TECHNIQUE: Multidetector CT imaging of the abdomen and pelvis was performed using the standard protocol following bolus administration of intravenous contrast.  CONTRAST: OMNIPAQUE IOHEXOL 350 MG/ML SOLN  COMPARISON: 05/30/2007  FINDINGS: There is diffuse diverticulosis involving the descending and sigmoid colon. No evidence to suggest diverticulitis. No focal lesion is identified within the bowel by CT. No evidence of small bowel dilatation. No free air, free fluid or abscess identified.  The liver, gallbladder, pancreas and kidneys are unremarkable. There is a stable densely calcified distal splenic artery aneurysm in the splenic hilum measuring approximately 13 mm. Stable low density nodule of the right adrenal gland likely represents adenoma and measures 2 cm in greatest dimensions. Stable densely calcified abdominal aorta and iliac arteries without evidence of aneurysm. The bladder is unremarkable. Bilateral inguinal hernias are present. The right inguinal hernia contains a segment of the appendix. No evidence of appendicitis.  Bony structures demonstrate interval lumbar fusion at the L3-4 and L4-5 levels.  IMPRESSION: 1. Diverticulosis involving the descending and sigmoid colon without evidence of diverticulitis or focal bowel lesions. 2. Stable 13 mm calcified distal splenic artery aneurysm at the splenic hilum. 3. Stable low-density right adrenal nodule consistent with adenoma. 4. Bilateral inguinal hernias with the right inguinal hernia containing the appendix.   Electronically Signed  By: Irish Lack M.D.  On: 04/20/2015 19:23  Assessment/Plan: Ms. Pavone is a 82 y.o. female with GI bleed, previous diverticular bleed that required embolized and multiple transfusions, COPD on 2L O2 24/7, hgb on adm 11.8, currently 11.3, hgb on  discharge 04/06/2015 12.9, WBC 20.4, WBC on 1/4 12.4; 1/5 17.0; CT showed diverticulosis without  diverticulitis  Is being treated with protonix, clear liquids.  Recommendations: This is more than likely diverticular bleed.  Continue with current treatment, observe for any further bleeding.  We will continue to follow with you. Thank you for the consult. Please call with questions or concerns.  Carney Harder, PA-C  I personally performed these services.

## 2015-04-21 NOTE — Consult Note (Signed)
Pt with LGI bleed likely diverticular.  No bleeding at this time.  I usually recommend 48 hours without bleeding in hospital before the patient can go home.  She has not seen any blood since thim morning.  Hgb 10.6, WBC 20.4. She is on clear liquid diet for now.  Would consider DC if no bleeding through Saturday but am worried about her WBC elevation.  See Sung Amabile PA full consult note.  No plans for colonoscopy unless rebleeds.

## 2015-04-21 NOTE — Progress Notes (Signed)
[facility] - [place] at [facility]   PATIENT NAME: [name]    MR#:  [id number]  DATE OF BIRTH:  [birth year]  SUBJECTIVE:   Patient is here due to rectal bleeding. Hemoglobin stable. Had one episode of rectal bleeding this morning but none since then. Has some vague abdominal pain. Tolerating clear liquids well.  REVIEW OF SYSTEMS:    Review of Systems  Constitutional: Negative for fever and chills.  HENT: Negative for congestion and tinnitus.   Eyes: Negative for blurred vision and double vision.  Respiratory: Negative for cough, shortness of breath and wheezing.   Cardiovascular: Negative for chest pain, orthopnea and PND.  Gastrointestinal: Positive for blood in stool. Negative for nausea, vomiting, abdominal pain and diarrhea.  Genitourinary: Negative for dysuria and hematuria.  Neurological: Negative for dizziness, sensory change and focal weakness.  All other systems reviewed and are negative.   Nutrition: Clear liquid Tolerating Diet: Yes Tolerating PT: Ambulatory   DRUG ALLERGIES:   Allergies  Allergen Reactions  . Other Diarrhea, Rash and Other (See Comments)    Pt states that she is allergic to all -mycins.   Reaction: Sore tongue, raw mouth, and weakness  . Penicillins Other (See Comments)    Reaction:  Dizziness  Has patient had a PCN reaction causing immediate rash, facial/tongue/throat swelling, SOB or lightheadedness with hypotension: No Has patient had a PCN reaction causing severe rash involving mucus membranes or skin necrosis: No Has patient had a PCN reaction that required hospitalization No Has patient had a PCN reaction occurring within the last 10 years: No If all of the above answers are "NO", then may proceed with Cephalosporin use.  . Sulfa Antibiotics Shortness Of Breath  . Morphine Sulfate Nausea And Vomiting and Other (See Comments)    Reaction:  Hallucinations   . Macrolides And Ketolides Diarrhea and Nausea  And Vomiting  . Iron Diarrhea  . Naproxen Rash    VITALS:  Blood pressure 110/46, pulse 53, temperature 98.6 F (37 C), temperature source Oral, resp. rate 18, height 5' (1.524 m), weight 58.514 kg (129 lb), SpO2 99 %.  PHYSICAL EXAMINATION:   Physical Exam  GENERAL:  82 y.o.-year-old patient lying in the bed with no acute distress.  EYES: Pupils equal, round, reactive to light and accommodation. No scleral icterus. Extraocular muscles intact.  HEENT: Head atraumatic, normocephalic. Oropharynx and nasopharynx clear.  NECK:  Supple, no jugular venous distention. No thyroid enlargement, no tenderness.  LUNGS: Normal breath sounds bilaterally, no wheezing, rales, rhonchi. No use of accessory muscles of respiration.  CARDIOVASCULAR: S1, S2 normal. II/VI SEM at LSB, No rubs, or gallops.  ABDOMEN: Soft, nontender, nondistended. Bowel sounds present. No organomegaly or mass.  EXTREMITIES: No cyanosis, clubbing or edema b/l.    NEUROLOGIC: Cranial nerves II through XII are intact. No focal Motor or sensory deficits b/l.   PSYCHIATRIC: The patient is alert and oriented x 3.  SKIN: No obvious rash, lesion, or ulcer.    LABORATORY PANEL:   CBC  Recent Labs Lab 04/21/15 0129 04/21/15 1315  WBC 20.4*  --   HGB 11.3* 10.6*  HCT 34.9*  --   PLT 231  --    ------------------------------------------------------------------------------------------------------------------  Chemistries   Recent Labs Lab 04/20/15 1417 04/21/15 0129  NA 138 138  K 3.7 3.5  CL 103 105  CO2 28 27  GLUCOSE 165* 169*  BUN 26* 22*  CREATININE 0.77 0.54  CALCIUM 8.6* 7.7*  AST 12*  --  ALT 17  --   ALKPHOS 55  --   BILITOT 0.4  --    ------------------------------------------------------------------------------------------------------------------  Cardiac Enzymes No results for input(s): TROPONINI in the last 168  hours. ------------------------------------------------------------------------------------------------------------------  RADIOLOGY:  Ct Abdomen Pelvis W Contrast  04/20/2015  CLINICAL DATA:  Abdominal pain and rectal bleeding. EXAM: CT ABDOMEN AND PELVIS WITH CONTRAST TECHNIQUE: Multidetector CT imaging of the abdomen and pelvis was performed using the standard protocol following bolus administration of intravenous contrast. CONTRAST:  OMNIPAQUE IOHEXOL 350 MG/ML SOLN COMPARISON:  05/30/2007 FINDINGS: There is diffuse diverticulosis involving the descending and sigmoid colon. No evidence to suggest diverticulitis. No focal lesion is identified within the bowel by CT. No evidence of small bowel dilatation. No free air, free fluid or abscess identified. The liver, gallbladder, pancreas and kidneys are unremarkable. There is a stable densely calcified distal splenic artery aneurysm in the splenic hilum measuring approximately 13 mm. Stable low density nodule of the right adrenal gland likely represents adenoma and measures 2 cm in greatest dimensions. Stable densely calcified abdominal aorta and iliac arteries without evidence of aneurysm. The bladder is unremarkable. Bilateral inguinal hernias are present. The right inguinal hernia contains a segment of the appendix. No evidence of appendicitis. Bony structures demonstrate interval lumbar fusion at the L3-4 and L4-5 levels. IMPRESSION: 1. Diverticulosis involving the descending and sigmoid colon without evidence of diverticulitis or focal bowel lesions. 2. Stable 13 mm calcified distal splenic artery aneurysm at the splenic hilum. 3. Stable low-density right adrenal nodule consistent with adenoma. 4. Bilateral inguinal hernias with the right inguinal hernia containing the appendix. Electronically Signed   By: Irish Lack M.D.   On: 04/20/2015 19:23     ASSESSMENT AND PLAN:   82 year old female with past medical history of diverticulosis, chronic  anemia, diabetes type 2, osteoporosis, coronary disease, COPD, anxiety/depression, chronic back pain, restless leg syndrome, paroxysmal atrial fibrillation, history of CHF who presented to the hospital with rectal bleeding.  #1 GI bleed-lower GI bleed as patient presents with rectal bleeding. Suspect this is probably a diverticular bleed. Patient's hemoglobin is stable. If she continues to have further rectal bleeding will get a bleeding scan. -Hold aspirin, await gastroenterology input.  #2 end-stage COPD-no evidence of acute exacerbation. -Continue Spiriva, Dulera.  #3 hypertension-continue ramipril, metoprolol, Norvasc.  #4 hyperlipidemia-continue atorvastatin.  #5 history of coronary artery disease-no acute chest pain. Hold aspirin given the GI bleed. -Continue metoprolol, atorvastatin.  #6 chronic pain-continue MS Contin, oxycodone as needed.   All the records are reviewed and case discussed with Care Management/Social Workerr. Management plans discussed with the patient, family and they are in agreement.  CODE STATUS: DO NOT RESUSCITATE  DVT Prophylaxis: Teds and SCDs  TOTAL TIME TAKING CARE OF THIS PATIENT: 30 minutes.   POSSIBLE D/C IN 1-2 DAYS, DEPENDING ON CLINICAL CONDITION.   Houston Siren M.D on 04/21/2015 at 3:10 PM  Between 7am to 6pm - Pager - [phone]  After 6pm go to [email or web address] - password [email or web address] [facility]  [place] [facility]  Office  [phone]  CC: Primary care physician; Hannah Beat, MD

## 2015-04-21 NOTE — Progress Notes (Signed)
[facility]  Patient Status: active  [facility] is providing the following services: PT  If patient discharges after hours, please call [phone].   [name] 04/21/2015, 9:45 AM

## 2015-04-21 NOTE — Progress Notes (Signed)
Patient asking for food. Dr Markham Jordan paged and states to keep patient on clear liquids. Continue to monitor.

## 2015-04-22 LAB — CBC
HEMATOCRIT: 30.8 % — AB (ref 35.0–47.0)
HEMOGLOBIN: 10.1 g/dL — AB (ref 12.0–16.0)
MCH: 29.6 pg (ref 26.0–34.0)
MCHC: 32.7 g/dL (ref 32.0–36.0)
MCV: 90.4 fL (ref 80.0–100.0)
Platelets: 205 10*3/uL (ref 150–440)
RBC: 3.41 MIL/uL — AB (ref 3.80–5.20)
RDW: 14.1 % (ref 11.5–14.5)
WBC: 17 10*3/uL — ABNORMAL HIGH (ref 3.6–11.0)

## 2015-04-22 NOTE — Consult Note (Signed)
Patient with hgb 10.1, VSS afeb. Probable diverticular in origin. Diet increased to full liquid and mashed potatoes,  Very much wants to go home.  Currently with some lower abd discomfort which was absent yesterday.  If further bleeding occurs please get stat GI bleeding scan and call Dr. Bluford Kaufmann who is on call.  If she does not have further bleeding can likely go home.

## 2015-04-22 NOTE — Care Management Important Message (Signed)
Important Message  Patient Details  Name: [name] MRN: [id number] Date of Birth: 07-08-1931   Medicare Important Message Given:  Yes    Olegario Messier [name] 04/22/2015, 11:32 AM

## 2015-04-22 NOTE — Care Management (Signed)
Patient does not qualify for continuous O2 per RN O2 sats assessment. I asked DR. Sainani for resumption of home health. Patient discharging home today. No further RNCM needs. Case closed.

## 2015-04-22 NOTE — Care Management (Signed)
This patient is currently open to [facility] for HHPT.

## 2015-04-22 NOTE — Care Management (Signed)
Patient has noctural O2 at home. Need O2 assessment and new O2 order if patients meets criteria.

## 2015-04-22 NOTE — Progress Notes (Signed)
Patient discharging home. Friend at bedside to provide transport. PT working with patient. Iv removed. Continue to monitor.

## 2015-04-22 NOTE — Evaluation (Signed)
Physical Therapy Evaluation Patient Details Name: [name] MRN: [id number] DOB: Nov 29, 1931 Today's Date: 04/22/2015   History of Present Illness  Pt admitted for rectal bleeding and diverticulosis. Pt with history of HTN and COPD.   Clinical Impression  Pt is a pleasant 82 year old female who was admitted for rectal bleeding and diverticulosis. Pt demonstrates all bed mobility/transfers/ambulation at baseline level. Pt does not require any further PT needs at this time in acute care stay. Pt would benefit from further HHPT to improve endurance and maintain safety within home environment. Will plan to dc current order at this time.     Follow Up Recommendations Home health PT    Equipment Recommendations       Recommendations for Other Services       Precautions / Restrictions Precautions Precautions: None Restrictions Weight Bearing Restrictions: No      Mobility  Bed Mobility               General bed mobility comments: not performed as pt received sitting at EOB donning shoes indepe  Transfers Overall transfer level: Independent Equipment used: Rolling walker (2 wheeled) Transfers: Sit to/from Stand Sit to Stand: Independent         General transfer comment: safe technique performed with RW.   Ambulation/Gait Ambulation/Gait assistance: Supervision Ambulation Distance (Feet): 200 Feet Assistive device: Rolling walker (2 wheeled) Gait Pattern/deviations: Step-through pattern     General Gait Details: reciprocal gait pattern performed with good speed noted. Safe technique with no LOB.   Stairs            Wheelchair Mobility    Modified Rankin (Stroke Patients Only)       Balance Overall balance assessment: Modified Independent                                           Pertinent Vitals/Pain Pain Assessment: No/denies pain    Home Living Family/patient expects to be discharged to:: Private residence Living  Arrangements: Alone   Type of Home: House Home Access: Stairs to enter Entrance Stairs-Rails: Can reach both Entrance Stairs-Number of Steps: 2 Home Layout: One level        Prior Function Level of Independence: Independent         Comments: furniture cruises     Hand Dominance        Extremity/Trunk Assessment   Upper Extremity Assessment: Overall WFL for tasks assessed           Lower Extremity Assessment: Overall WFL for tasks assessed         Communication   Communication: No difficulties  Cognition Arousal/Alertness: Awake/alert Behavior During Therapy: WFL for tasks assessed/performed Overall Cognitive Status: Within Functional Limits for tasks assessed                      General Comments      Exercises        Assessment/Plan    PT Assessment Patent does not need any further PT services  PT Diagnosis     PT Problem List    PT Treatment Interventions     PT Goals (Current goals can be found in the Care Plan section) Acute Rehab PT Goals Patient Stated Goal: to go home PT Goal Formulation: With patient Time For Goal Achievement: 04/22/15 Potential to Achieve Goals: Good    Frequency  Barriers to discharge        Co-evaluation               End of Session   Activity Tolerance: Patient tolerated treatment well Patient left: in bed Nurse Communication: Mobility status         Time: 5638-7564 PT Time Calculation (min) (ACUTE ONLY): 8 min   Charges:   PT Evaluation $PT Eval Low Complexity: 1 Procedure     PT G Codes:        Serge Main 05-18-15, 4:05 PM  Elizabeth Palau, PT, DPT [phone]

## 2015-04-22 NOTE — Progress Notes (Signed)
SATURATION QUALIFICATIONS: (This note is used to comply with regulatory documentation for home oxygen)  Patient Saturations on Room Air at Rest = 96 %  Patient Saturations on Room Air while Ambulating = 92 %  

## 2015-04-22 NOTE — Care Management Note (Signed)
Case Management Note  Patient Details  Name: CARRON MCMURRY MRN: [id number] Date of Birth: 27-Aug-1931  Subjective/Objective:                  Met with patient to discuss discharge planning. She plans to return home where she states she "lives independently". She states she still drives. Her PCP is DR. Copeland with [facility]. She uses Oncologist on [address] for Rx. She is on chronic O2 also through [facility] but states she does not have portable tanks. That may be because she has not qualified for chronic O2 and will need to be assessed so that [facility] can update medical need for home O2. She denies family support or friend support. "Honey I am independent everything", per patient.  Action/Plan: I am checking with [facility] to see if patient has noctural O2 or chronic O2 and if she has portable tanks. RNCM to continue to follow.   Expected Discharge Date:                  Expected Discharge Plan:     In-House Referral:     Discharge planning Services  CM Consult  Post Acute Care Choice:  Home Health Choice offered to:  Patient  DME Arranged:    DME Agency:     HH Arranged:  PT [place]:  [place]  Status of Service:  In process, will continue to follow  Medicare Important Message Given:  Yes Date Medicare IM Given:    Medicare IM give by:    Date Additional Medicare IM Given:    Additional Medicare Important Message give by:     If discussed at [place] of Stay Meetings, dates discussed:    Additional Comments:  Marshell Garfinkel, RN 04/22/2015, 12:22 PM

## 2015-04-22 NOTE — Discharge Summary (Signed)
[facility] [place] at [facility]   PATIENT NAME: [name]    MR#:  [id number]  DATE OF BIRTH:  March 28, 1932  DATE OF ADMISSION:  04/20/2015 ADMITTING PHYSICIAN: Alford Highland, MD  DATE OF DISCHARGE: 04/22/2015  3:31 PM  PRIMARY CARE PHYSICIAN: Hannah Beat, MD    ADMISSION DIAGNOSIS:  Lower GI bleed [K92.2]  DISCHARGE DIAGNOSIS:  Active Problems:   Rectal bleeding   SECONDARY DIAGNOSIS:   Past Medical History  Diagnosis Date  . Hypertension 08/01/1995  . Rheumatoid arthritis(714.0) 04/03/1983  . Osteoarthritis 04/03/1983  . Anemia, deficiency 07/01/2004    (w/u by Dr. Lorre Nick)  . Diabetes mellitus type II 07/31/1997  . Osteoporosis 12/31/2000  . CAD (coronary artery disease) 09/01/2002    a. s/p NSTEMI 2012. b. s/p CABG 01/2012.  [name] Hyperlipemia 07/02/1999  . COPD (chronic obstructive pulmonary disease) (HCC) 05/31/2004  . diverticulosis of colon   . Arteriovenous malformation of gastrointestinal tract   . Fatty liver   . Chronic back pain   . Splenic artery aneurysm (HCC)   . Anxiety and depression   . GI bleed     a. 04/2011 felt diverticular requiring microembolization by vascular.  . Adrenal mass (HCC)     a. Excision 2007.  [name] Restless leg syndrome   . C. difficile colitis     a. 04/2008 = severe.  [name] Hx of CABG 04/18/2012  . PAF (paroxysmal atrial fibrillation) (HCC) 12/16/2013  . 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 Chronic back pain greater than 3 months duration 11/30/2014  . CHF (congestive heart failure) [facility])     HOSPITAL COURSE:   82 year old female with past medical history of diverticulosis, chronic anemia, diabetes type 2, osteoporosis, coronary disease, COPD, anxiety/depression, chronic back pain, restless leg syndrome, paroxysmal atrial fibrillation, history of CHF who presented to the hospital with rectal bleeding.  #1 GI bleed- this was a lower GI bleed as patient presents with rectal  bleeding. Patient has a history of diverticulosis therefore this was a diverticular bleed. -Patient's aspirin was held. Her rectal bleeding since then has resolved then stop. Her hemoglobin has remained stable. -Gastroenterology was consulted and they did not recommend any acute intervention. Her diet has been slowly advanced from liquid to a mechanical soft diet  and she is tolerating it well without any acute bleeding. She is being advised to hold her aspirin for another 2-3 days and to resume it next week. - She will follow up with gastroenterology as an outpatient. gastroenterology input.  #2 end-stage COPD-no evidence of acute exacerbation while in the hospital. -she will Continue Spiriva, Dulera.  #3 hypertension-she will continue ramipril, metoprolol, Norvasc.  #4 hyperlipidemia- she will continue atorvastatin.  #5 history of coronary artery disease-no acute chest pain.  - she will Continue metoprolol, atorvastatin.  #6 chronic pain- she will continue MS Contin, oxycodone as needed.   Patient is being discharged home with home health PT services   DISCHARGE CONDITIONS:   Stable  CONSULTS OBTAINED:  Treatment Team:  Scot Jun, MD  DRUG ALLERGIES:   Allergies  Allergen Reactions  . Other Diarrhea, Rash and Other (See Comments)    Pt states that she is allergic to all -mycins.   Reaction: Sore tongue, raw mouth, and weakness  . Penicillins Other (See Comments)    Reaction:  Dizziness  Has patient had a PCN reaction causing immediate rash, facial/tongue/throat swelling, SOB or lightheadedness with hypotension: No Has patient had  a PCN reaction causing severe rash involving mucus membranes or skin necrosis: No Has patient had a PCN reaction that required hospitalization No Has patient had a PCN reaction occurring within the last 10 years: No If all of the above answers are "NO", then may proceed with Cephalosporin use.  . Sulfa Antibiotics Shortness Of Breath  .  Morphine Sulfate Nausea And Vomiting and Other (See Comments)    Reaction:  Hallucinations   . Macrolides And Ketolides Diarrhea and Nausea And Vomiting  . Iron Diarrhea  . Naproxen Rash    DISCHARGE MEDICATIONS:   Discharge Medication List as of 04/22/2015  3:14 PM    CONTINUE these medications which have NOT CHANGED   Details  acidophilus (RISAQUAD) CAPS capsule Take 1 capsule by mouth daily., Until Discontinued, Historical Med    albuterol (PROVENTIL HFA;VENTOLIN HFA) 108 (90 Base) MCG/ACT inhaler Inhale 2 puffs into the lungs every 6 (six) hours as needed for wheezing or shortness of breath., Until Discontinued, Historical Med    albuterol (PROVENTIL) (2.5 MG/3ML) 0.083% nebulizer solution Take 2.5 mg by nebulization every 4 (four) hours as needed for wheezing or shortness of breath., Until Discontinued, Historical Med    amLODipine (NORVASC) 5 MG tablet Take 1 tablet (5 mg total) by mouth daily., Starting 04/19/2015, Until Discontinued, Normal    aspirin EC 81 MG tablet Take 1 tablet (81 mg total) by mouth daily., Starting 08/13/2013, Until Discontinued, No Print    atorvastatin (LIPITOR) 40 MG tablet Take 1 tablet (40 mg total) by mouth daily., Starting 04/19/2015, Until Discontinued, Normal    docusate sodium (COLACE) 100 MG capsule Take 100 mg by mouth 2 (two) times daily as needed for mild constipation., Until Discontinued, Historical Med    Fluticasone-Salmeterol (ADVAIR DISKUS) 500-50 MCG/DOSE AEPB Inhale 1 puff into the lungs 2 (two) times daily., Starting 09/27/2014, Until Discontinued, Normal    Melatonin 5 MG TABS Take 5 mg by mouth at bedtime as needed (for sleep). , Until Discontinued, Historical Med    metoprolol (LOPRESSOR) 50 MG tablet Take 1 tablet (50 mg total) by mouth 2 (two) times daily., Starting 04/19/2015, Until Discontinued, Normal    !! morphine (MS CONTIN) 15 MG 12 hr tablet Take 15 mg by mouth every 12 (twelve) hours., Until Discontinued, Historical Med     !! morphine (MS CONTIN) 30 MG 12 hr tablet Take 30 mg by mouth every 12 (twelve) hours., Until Discontinued, Historical Med    nitroGLYCERIN (NITROSTAT) 0.4 MG SL tablet Place 0.4 mg under the tongue every 5 (five) minutes as needed for chest pain., Until Discontinued, Historical Med    Omega-3 Fatty Acids (FISH OIL) 1000 MG CAPS Take 1,000 mg by mouth daily. , Until Discontinued, Historical Med    oxyCODONE (OXYCONTIN) 30 MG 12 hr tablet Take 30 mg by mouth at bedtime. , Until Discontinued, Historical Med    ramipril (ALTACE) 5 MG capsule Take 2 capsules (10 mg total) by mouth at bedtime., Starting 04/19/2015, Until Discontinued, Normal    tiotropium (SPIRIVA) 18 MCG inhalation capsule Place 1 capsule (18 mcg total) into inhaler and inhale daily., Starting 09/27/2014, Until Discontinued, Normal    tiZANidine (ZANAFLEX) 4 MG tablet Take 2-4 mg by mouth at bedtime as needed for muscle spasms., Until Discontinued, Historical Med    VITAMIN E PO Take 1 capsule by mouth daily. , Until Discontinued, Historical Med     !! - Potential duplicate medications found. Please discuss with provider.  DISCHARGE INSTRUCTIONS:   DIET:  Cardiac diet  DISCHARGE CONDITION:  Stable  ACTIVITY:  Activity as tolerated  OXYGEN:  Home Oxygen: No.   Oxygen Delivery: room air  DISCHARGE LOCATION:  Home with home health physical therapy   If you experience worsening of your admission symptoms, develop shortness of breath, life threatening emergency, suicidal or homicidal thoughts you must seek medical attention immediately by calling 911 or calling your MD immediately  if symptoms less severe.  You Must read complete instructions/literature along with all the possible adverse reactions/side effects for all the Medicines you take and that have been prescribed to you. Take any new Medicines after you have completely understood and accpet all the possible adverse reactions/side effects.   Please  note  You were cared for by a hospitalist during your hospital stay. If you have any questions about your discharge medications or the care you received while you were in the hospital after you are discharged, you can call the unit and asked to speak with the hospitalist on call if the hospitalist that took care of you is not available. Once you are discharged, your primary care physician will handle any further medical issues. Please note that NO REFILLS for any discharge medications will be authorized once you are discharged, as it is imperative that you return to your primary care physician (or establish a relationship with a primary care physician if you do not have one) for your aftercare needs so that they can reassess your need for medications and monitor your lab values.     Today   100 and wants to eat. No acute bleeding overnight. No abdominal pain, nausea, vomiting.  VITAL SIGNS:  Blood pressure 118/45, pulse 66, temperature 97.9 F (36.6 C), temperature source Oral, resp. rate 18, height 5' (1.524 m), weight 58.514 kg (129 lb), SpO2 100 %.  I/O:   Intake/Output Summary (Last 24 hours) at 04/22/15 1601 Last data filed at 04/22/15 1200  Gross per 24 hour  Intake   1260 ml  Output      2 ml  Net   1258 ml    PHYSICAL EXAMINATION:   GENERAL: 82 y.o.-year-old patient lying in the bed with no acute distress.  EYES: Pupils equal, round, reactive to light and accommodation. No scleral icterus. Extraocular muscles intact.  HEENT: Head atraumatic, normocephalic. Oropharynx and nasopharynx clear.  NECK: Supple, no jugular venous distention. No thyroid enlargement, no tenderness.  LUNGS: Normal breath sounds bilaterally, no wheezing, rales, rhonchi. No use of accessory muscles of respiration.  CARDIOVASCULAR: S1, S2 normal. II/VI SEM at LSB, No rubs, or gallops.  ABDOMEN: Soft, nontender, nondistended. Bowel sounds present. No organomegaly or mass.  EXTREMITIES: No  cyanosis, clubbing or edema b/l.  NEUROLOGIC: Cranial nerves II through XII are intact. No focal Motor or sensory deficits b/l.  PSYCHIATRIC: The patient is alert and oriented x 3.  SKIN: No obvious rash, lesion, or ulcer.   DATA REVIEW:   CBC  Recent Labs Lab 04/22/15 0537  WBC 17.0*  HGB 10.1*  HCT 30.8*  PLT 205    Chemistries   Recent Labs Lab 04/20/15 1417 04/21/15 0129  NA 138 138  K 3.7 3.5  CL 103 105  CO2 28 27  GLUCOSE 165* 169*  BUN 26* 22*  CREATININE 0.77 0.54  CALCIUM 8.6* 7.7*  AST 12*  --   ALT 17  --   ALKPHOS 55  --   BILITOT 0.4  --  Cardiac Enzymes No results for input(s): TROPONINI in the last 168 hours.   RADIOLOGY:  Ct Abdomen Pelvis W Contrast  04/20/2015  CLINICAL DATA:  Abdominal pain and rectal bleeding. EXAM: CT ABDOMEN AND PELVIS WITH CONTRAST TECHNIQUE: Multidetector CT imaging of the abdomen and pelvis was performed using the standard protocol following bolus administration of intravenous contrast. CONTRAST:  OMNIPAQUE IOHEXOL 350 MG/ML SOLN COMPARISON:  05/30/2007 FINDINGS: There is diffuse diverticulosis involving the descending and sigmoid colon. No evidence to suggest diverticulitis. No focal lesion is identified within the bowel by CT. No evidence of small bowel dilatation. No free air, free fluid or abscess identified. The liver, gallbladder, pancreas and kidneys are unremarkable. There is a stable densely calcified distal splenic artery aneurysm in the splenic hilum measuring approximately 13 mm. Stable low density nodule of the right adrenal gland likely represents adenoma and measures 2 cm in greatest dimensions. Stable densely calcified abdominal aorta and iliac arteries without evidence of aneurysm. The bladder is unremarkable. Bilateral inguinal hernias are present. The right inguinal hernia contains a segment of the appendix. No evidence of appendicitis. Bony structures demonstrate interval lumbar fusion at the  L3-4 and L4-5 levels. IMPRESSION: 1. Diverticulosis involving the descending and sigmoid colon without evidence of diverticulitis or focal bowel lesions. 2. Stable 13 mm calcified distal splenic artery aneurysm at the splenic hilum. 3. Stable low-density right adrenal nodule consistent with adenoma. 4. Bilateral inguinal hernias with the right inguinal hernia containing the appendix. Electronically Signed   By: Irish Lack M.D.   On: 04/20/2015 19:23      Management plans discussed with the patient, family and they are in agreement.  CODE STATUS:     Code Status Orders        Start     Ordered   04/20/15 2114  Do not attempt resuscitation (DNR)   Continuous    Question Answer Comment  In the event of cardiac or respiratory ARREST Do not call a "code blue"   In the event of cardiac or respiratory ARREST Do not perform Intubation, CPR, defibrillation or ACLS   In the event of cardiac or respiratory ARREST Use medication by any route, position, wound care, and other measures to relive pain and suffering. May use oxygen, suction and manual treatment of airway obstruction as needed for comfort.   Comments Nurse may pronounce      04/20/15 2114    Code Status History    Date Active Date Inactive Code Status Order ID Comments User Context   04/05/2015 10:42 PM 04/07/2015  4:50 PM Full Code [id number]  Wyatt Haste, MD ED   01/26/2012  3:04 PM 01/29/2012  7:25 PM Full Code [id number]  Tacey Ruiz, RN Inpatient   01/22/2012  1:10 PM 01/26/2012  3:04 PM Full Code [id number]  Maggie Schwalbe, RN Inpatient    Advance Directive Documentation        Most Recent Value   Type of Advance Directive  Healthcare Power of Attorney   Pre-existing out of facility DNR order (yellow form or pink MOST form)     "MOST" Form in Place?        TOTAL TIME TAKING CARE OF THIS PATIENT: 40 minutes.    Houston Siren M.D on 04/22/2015 at 4:01 PM  Between 7am to 6pm - Pager - [phone]  After  6pm go to [email or web address] - password [email or web address] [facility]  [facility]  Office  [phone]  CC: Primary care physician; Karleen Hampshire  Copland, MD

## 2015-04-27 ENCOUNTER — Ambulatory Visit: Payer: Medicare Other | Admitting: Family Medicine

## 2015-04-28 ENCOUNTER — Telehealth: Payer: Self-pay

## 2015-04-28 NOTE — Telephone Encounter (Signed)
Amy Pope nurse with [facility] left v/m notifying Dr Patsy Lager that pt is being discharged from home health services. Pt is driving and is not home bound. Amy request cb to verify Dr Patsy Lager agrees with plan.

## 2015-04-28 NOTE — Telephone Encounter (Signed)
Left message for Amy that per Dr. Patsy Lager it should be ok that she is being discharged from [facility]. We are seeing her next week for her hospital follow up.

## 2015-04-28 NOTE — Telephone Encounter (Signed)
This should be ok - i am seeing her next week for hospital f/u and will check up.

## 2015-05-04 ENCOUNTER — Ambulatory Visit: Payer: Medicare Other | Admitting: Family Medicine

## 2015-05-04 DIAGNOSIS — I110 Hypertensive heart disease with heart failure: Secondary | ICD-10-CM

## 2015-05-04 DIAGNOSIS — J441 Chronic obstructive pulmonary disease with (acute) exacerbation: Secondary | ICD-10-CM

## 2015-05-04 DIAGNOSIS — I480 Paroxysmal atrial fibrillation: Secondary | ICD-10-CM

## 2015-05-04 DIAGNOSIS — I5033 Acute on chronic diastolic (congestive) heart failure: Secondary | ICD-10-CM

## 2015-05-05 ENCOUNTER — Ambulatory Visit: Payer: Medicare Other | Admitting: Family Medicine

## 2015-05-06 ENCOUNTER — Telehealth: Payer: Self-pay | Admitting: Family Medicine

## 2015-05-06 NOTE — Telephone Encounter (Signed)
Pt did not come in for their appt on 05/05/15 for hospital follow up. Please let me know if pt needs to be contacted immediately for follow up or no follow up needed. Best phone number to contact pt is [phone].

## 2015-05-06 NOTE — Telephone Encounter (Signed)
She should f/u with me next week. Multiple recent hospitalizations.

## 2015-05-09 NOTE — Telephone Encounter (Signed)
Left voicemail for pt to call back and reschedule appt.

## 2015-05-18 ENCOUNTER — Encounter: Payer: Self-pay | Admitting: Family Medicine

## 2015-05-18 ENCOUNTER — Ambulatory Visit (INDEPENDENT_AMBULATORY_CARE_PROVIDER_SITE_OTHER): Payer: Medicare Other | Admitting: Family Medicine

## 2015-05-18 VITALS — BP 151/78 | HR 56 | Temp 98.4°F | Ht 60.0 in | Wt 134.5 lb

## 2015-05-18 DIAGNOSIS — M19049 Primary osteoarthritis, unspecified hand: Secondary | ICD-10-CM

## 2015-05-18 DIAGNOSIS — M79641 Pain in right hand: Secondary | ICD-10-CM | POA: Diagnosis not present

## 2015-05-18 DIAGNOSIS — M79642 Pain in left hand: Secondary | ICD-10-CM | POA: Diagnosis not present

## 2015-05-18 NOTE — Progress Notes (Signed)
Dr. Karleen Hampshire T. Kellyjo Edgren, MD, CAQ Sports Medicine [facility] Sports [facility] [address] [place], [address] Phone: [phone] Fax: [phone]  05/18/2015  Patient: [name], MRN: [id number], DOB: 11-28-1931, 82 y.o.  Primary Physician:  Hannah Beat, MD   Chief Complaint  Patient presents with  . Hand Cramping    Right   Subjective:   [name] is a 82 y.o. very pleasant female patient who presents with the following:  Hand cramping, f/u GI bleed:  R trigger fingers. 4th on R is the worst. S/p trigger release Diffuse hand OA - dramatic intermittent pain  No further bleeding  Past Medical History, Surgical History, Social History, Family History, Problem List, Medications, and Allergies have been reviewed and updated if relevant.  Patient Active Problem List   Diagnosis Date Noted  . Chronic respiratory failure with hypoxia (HCC) 04/14/2015    Priority: High  . History of non-ST elevation myocardial infarction (NSTEMI) 07/22/2011    Priority: High  . GIB (gastrointestinal bleeding) 04/25/2011    Priority: High  . COPD with emphysema (HCC) 05/31/2004    Priority: High  . CAD (coronary artery disease) 09/01/2002    Priority: High  . Chronic back pain greater than 3 months duration 11/30/2014    Priority: Medium  . Smoking 02/09/2011    Priority: Medium  . AAA (abdominal aortic aneurysm) (HCC) 12/22/2010    Priority: Medium  . HYPERLIPIDEMIA 07/02/1999    Priority: Medium  . Diabetes mellitus type 2 with neurological manifestations (HCC) 07/31/1997    Priority: Medium  . Essential hypertension, benign 08/01/1995    Priority: Medium  . Rheumatoid arthritis(714.0) 04/03/1983    Priority: Medium  . Rectal bleeding 04/20/2015  . COPD exacerbation (HCC) 04/05/2015  . Pedal edema 08/31/2014  . Dyspnea 08/31/2014  . 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 PAF (paroxysmal atrial fibrillation) (HCC) 12/16/2013  .  History of GI diverticular bleed 09/25/2012  . Depression 06/17/2012  . Hx of CABG 04/18/2012  . Acute on chronic diastolic CHF (congestive heart failure) (HCC) 02/05/2012  . Splenic artery aneurysm (HCC)   . DIVERTICULOSIS OF COLON 05/14/2008  . ANXIETY DEPRESSION 04/19/2008  . ECZEMA 07/21/2007  . ARTERIOVENOUS MALFORMATION, GASTRIC 11/08/2006  . INSOMNIA, CHRONIC 07/21/2006  . ANEMIA-IRON DEFICIENCY 07/01/2004  . FATTY LIVER DISEASE 07/16/2002  . OSTEOPOROSIS 12/31/2000  . OSTEOARTHRITIS 04/03/1983    Past Medical History  Diagnosis Date  . Hypertension 08/01/1995  . Rheumatoid arthritis(714.0) 04/03/1983  . Osteoarthritis 04/03/1983  . Anemia, deficiency 07/01/2004    (w/u by Dr. Lorre Nick)  . Diabetes mellitus type II 07/31/1997  . Osteoporosis 12/31/2000  . CAD (coronary artery disease) 09/01/2002    a. s/p NSTEMI 2012. b. s/p CABG 01/2012.  [name] Hyperlipemia 07/02/1999  . COPD (chronic obstructive pulmonary disease) (HCC) 05/31/2004  . diverticulosis of colon   . Arteriovenous malformation of gastrointestinal tract   . Fatty liver   . Chronic back pain   . Splenic artery aneurysm (HCC)   . Anxiety and depression   . GI bleed     a. 04/2011 felt diverticular requiring microembolization by vascular.  . Adrenal mass (HCC)     a. Excision 2007.  [name] Restless leg syndrome   . C. difficile colitis     a. 04/2008 = severe.  [name] Hx of CABG 04/18/2012  . PAF (paroxysmal atrial fibrillation) (HCC) 12/16/2013  . 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  Chronic back pain greater than 3 months duration 11/30/2014  . CHF (congestive heart failure) [facility])     Past Surgical History  Procedure Laterality Date  . Nasal sinus surgery    . Inguinal hernia repair    . Partial hysterectomy      ovaries intact, dysmennorhea w/ A/P repari  . Carpal tunnel release      bilat  . Trigger finger release      right  . Wrist surgery      neuroma repair, right  . Ankle  surgery      neuroma repair, right ORIF  . Total shoulder replacement  12/13/1998    right, ([facility])  . Rotator cuff repair  11/22/2003    ([facility]) left  . Epidural block injection  05/2005    x 3  . Long finger tendon realignment  10/31/2005    Meyerdierks  . Lumbar epidural  06/13/2006  . Laminectomy  06/2006    L4/5 spinal stenosis (Dr. [name])  . Laminectomy  02/24/2008    L3/4 Ant/Lat interbody fusion and Lat Artthrodesis w/plate using XLIF (Dr. [name])  . Colonic embolization  04/18/2011    Dr. Wyn Quaker, acute GI bleed  . Spinal cord stimulator implant    . Coronary artery bypass graft  01/22/2012    Procedure: CORONARY ARTERY BYPASS GRAFTING (CABG);  Surgeon: Delight Ovens, MD;  Location: [facility] OR;  Service: Open Heart Surgery;  Laterality: N/A;  Coronary Artery Bypass Graft times four utilizing the left internal mammary artery and the left greater saphenous vein harvested endoscopically.  Rhae Hammock without cardioversion  01/22/2012    Procedure: TRANSESOPHAGEAL ECHOCARDIOGRAM (TEE);  Surgeon: Delight Ovens, MD;  Location: [facility] OR;  Service: Open Heart Surgery;  Laterality: N/A;    Social History   Social History  . Marital Status: Widowed    Spouse Name: N/A  . Number of Children: 4  . Years of Education: N/A   Occupational History  . [employer]     9 hour days-6 days/week   Social History Main Topics  . Smoking status: Former Smoker -- 0.10 packs/day for 50 years    Types: Cigarettes    Quit date: 12/18/2014  . Smokeless tobacco: Former Neurosurgeon  . Alcohol Use: 0.0 oz/week    0 Standard drinks or equivalent per week     Comment: red wine occassionally  . Drug Use: No  . Sexual Activity: No   Other Topics Concern  . Not on file   Social History Narrative   Married, widow 47          Family History  Problem Relation Age of Onset  . Hypotension Mother   . Dementia Mother   . Alcohol abuse Father   . Heart disease Father     MI, enlarged heart  . Diabetes Brother      Allergies  Allergen Reactions  . Other Diarrhea, Rash and Other (See Comments)    Pt states that she is allergic to all -mycins.   Reaction: Sore tongue, raw mouth, and weakness  . Penicillins Other (See Comments)    Reaction:  Dizziness  Has patient had a PCN reaction causing immediate rash, facial/tongue/throat swelling, SOB or lightheadedness with hypotension: No Has patient had a PCN reaction causing severe rash involving mucus membranes or skin necrosis: No Has patient had a PCN reaction that required hospitalization No Has patient had a PCN reaction occurring within the last 10 years: No If all of the above answers  are "NO", then may proceed with Cephalosporin use.  . Sulfa Antibiotics Shortness Of Breath  . Morphine Sulfate Nausea And Vomiting and Other (See Comments)    Reaction:  Hallucinations   . Macrolides And Ketolides Diarrhea and Nausea And Vomiting  . Iron Diarrhea  . Naproxen Rash    Medication list reviewed and updated in full in [facility].   GEN: No acute illnesses, no fevers, chills. GI: No n/v/d, eating normally Pulm: No SOB Interactive and getting along well at home.  Otherwise, ROS is as per the HPI.  Objective:   BP 151/78 mmHg  Pulse 56  Temp(Src) 98.4 F (36.9 C) (Oral)  Ht 5' (1.524 m)  Wt 134 lb 8 oz (61.009 kg)  BMI 26.27 kg/m2  GEN: WDWN, NAD, Non-toxic, A & O x 3 HEENT: Atraumatic, Normocephalic. Neck supple. No masses, No LAD. Ears and Nose: No external deformity. EXTR: No c/c/e NEURO Normal gait.  PSYCH: Normally interactive. Conversant. Not depressed or anxious appearing.  Calm demeanor.   Laboratory and Imaging Data:  Assessment and Plan:   Bilateral hand pain  Osteoarthritis, hand, primary localized, unspecified laterality  Trial lidocaine cream  Patient Instructions  Aspercreme or similar product with 4% LIDOCAINE     Follow-up: No Follow-up on file.  Signed,  Elpidio Galea. Izabel Chim, MD   Patient's  Medications  New Prescriptions   No medications on file  Previous Medications   ACIDOPHILUS (RISAQUAD) CAPS CAPSULE    Take 1 capsule by mouth daily.   ALBUTEROL (PROVENTIL HFA;VENTOLIN HFA) 108 (90 BASE) MCG/ACT INHALER    Inhale 2 puffs into the lungs every 6 (six) hours as needed for wheezing or shortness of breath.   ALBUTEROL (PROVENTIL) (2.5 MG/3ML) 0.083% NEBULIZER SOLUTION    Take 2.5 mg by nebulization every 4 (four) hours as needed for wheezing or shortness of breath.   AMLODIPINE (NORVASC) 5 MG TABLET    Take 1 tablet (5 mg total) by mouth daily.   ASPIRIN EC 81 MG TABLET    Take 1 tablet (81 mg total) by mouth daily.   ATORVASTATIN (LIPITOR) 40 MG TABLET    Take 1 tablet (40 mg total) by mouth daily.   DOCUSATE SODIUM (COLACE) 100 MG CAPSULE    Take 100 mg by mouth 2 (two) times daily as needed for mild constipation.   FLUTICASONE-SALMETEROL (ADVAIR DISKUS) 500-50 MCG/DOSE AEPB    Inhale 1 puff into the lungs 2 (two) times daily.   IPRATROPIUM (ATROVENT) 0.02 % NEBULIZER SOLUTION       MELATONIN 5 MG TABS    Take 5 mg by mouth at bedtime as needed (for sleep).    METOPROLOL (LOPRESSOR) 50 MG TABLET    Take 1 tablet (50 mg total) by mouth 2 (two) times daily.   MORPHINE (MS CONTIN) 15 MG 12 HR TABLET    Take 15 mg by mouth every 12 (twelve) hours.   MORPHINE (MS CONTIN) 30 MG 12 HR TABLET    Take 30 mg by mouth every 12 (twelve) hours.   NITROGLYCERIN (NITROSTAT) 0.4 MG SL TABLET    Place 0.4 mg under the tongue every 5 (five) minutes as needed for chest pain.   OMEGA-3 FATTY ACIDS (FISH OIL) 1000 MG CAPS    Take 1,000 mg by mouth daily.    OXYCODONE (OXYCONTIN) 30 MG 12 HR TABLET    Take 30 mg by mouth at bedtime.    RAMIPRIL (ALTACE) 5 MG CAPSULE    Take  2 capsules (10 mg total) by mouth at bedtime.   TIOTROPIUM (SPIRIVA) 18 MCG INHALATION CAPSULE    Place 1 capsule (18 mcg total) into inhaler and inhale daily.   TIZANIDINE (ZANAFLEX) 4 MG TABLET    Take 2-4 mg by mouth at bedtime  as needed for muscle spasms.   VITAMIN E PO    Take 1 capsule by mouth daily.   Modified Medications   No medications on file  Discontinued Medications   No medications on file

## 2015-05-18 NOTE — Patient Instructions (Signed)
Aspercreme or similar product with 4% LIDOCAINE

## 2015-05-18 NOTE — Progress Notes (Signed)
Pre visit review using our clinic review tool, if applicable. No additional management support is needed unless otherwise documented below in the visit note. 

## 2015-06-02 ENCOUNTER — Emergency Department
Admission: EM | Admit: 2015-06-02 | Discharge: 2015-06-02 | Disposition: A | Discharge status: Home / Self Care | Payer: No Typology Code available for payment source | Attending: Emergency Medicine | Admitting: Emergency Medicine

## 2015-06-02 DIAGNOSIS — J42 Unspecified chronic bronchitis: Secondary | ICD-10-CM

## 2015-06-02 DIAGNOSIS — Z880 Allergy status to penicillin: Secondary | ICD-10-CM | POA: Diagnosis not present

## 2015-06-02 DIAGNOSIS — Y998 Other external cause status: Secondary | ICD-10-CM | POA: Insufficient documentation

## 2015-06-02 DIAGNOSIS — Y9389 Activity, other specified: Secondary | ICD-10-CM | POA: Insufficient documentation

## 2015-06-02 DIAGNOSIS — I10 Essential (primary) hypertension: Secondary | ICD-10-CM | POA: Insufficient documentation

## 2015-06-02 DIAGNOSIS — Y92410 Unspecified street and highway as the place of occurrence of the external cause: Secondary | ICD-10-CM | POA: Diagnosis not present

## 2015-06-02 DIAGNOSIS — Z7982 Long term (current) use of aspirin: Secondary | ICD-10-CM | POA: Diagnosis not present

## 2015-06-02 DIAGNOSIS — Z79899 Other long term (current) drug therapy: Secondary | ICD-10-CM | POA: Diagnosis not present

## 2015-06-02 DIAGNOSIS — E1149 Type 2 diabetes mellitus with other diabetic neurological complication: Secondary | ICD-10-CM | POA: Diagnosis not present

## 2015-06-02 DIAGNOSIS — Z7951 Long term (current) use of inhaled steroids: Secondary | ICD-10-CM | POA: Diagnosis not present

## 2015-06-02 DIAGNOSIS — S51811A Laceration without foreign body of right forearm, initial encounter: Secondary | ICD-10-CM | POA: Insufficient documentation

## 2015-06-02 DIAGNOSIS — Z79891 Long term (current) use of opiate analgesic: Secondary | ICD-10-CM | POA: Insufficient documentation

## 2015-06-02 DIAGNOSIS — S51812A Laceration without foreign body of left forearm, initial encounter: Secondary | ICD-10-CM | POA: Insufficient documentation

## 2015-06-02 DIAGNOSIS — T148XXA Other injury of unspecified body region, initial encounter: Secondary | ICD-10-CM

## 2015-06-02 DIAGNOSIS — J449 Chronic obstructive pulmonary disease, unspecified: Secondary | ICD-10-CM | POA: Insufficient documentation

## 2015-06-02 NOTE — ED Notes (Signed)
Pt discharged to home.  Family member driving.  Discharge instructions reviewed.  Verbalized understanding.  No questions or concerns at this time.  Teach back verified.  Pt in NAD.  No items left in ED.   
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
## 2015-06-02 NOTE — ED Notes (Signed)
EDP at bedside to bandage pt's arms.
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
## 2015-06-02 NOTE — Discharge Instructions (Signed)

## 2015-06-27 ENCOUNTER — Telehealth: Payer: Self-pay | Admitting: Family Medicine

## 2015-06-27 NOTE — Telephone Encounter (Signed)
TH scheduled appt with Dr Patsy Lager on 06/29/15 at 12:15.

## 2015-06-27 NOTE — Telephone Encounter (Signed)
Patient Name: EARSIE HUMM  DOB: [birth year]    Initial Comment Caller states her MIL had swelled feet, but now they're not. Wants to know if she should make an appointment.   Nurse Assessment  Nurse: Scarlette Ar, RN, Heather Date/Time (Eastern Time): 06/27/2015 2:05:13 PM  Confirm and document reason for call. If symptomatic, describe symptoms. You must click the next button to save text entered. ---Caller states her MIL had swollen feet last week, but now they're not. She woke up this morning without any swelling. Wants to know if she should make an appointment.  Has the patient traveled out of the country within the last 30 days? ---Not Applicable  Does the patient have any new or worsening symptoms? ---Yes  Will a triage be completed? ---Yes  Related visit to physician within the last 2 Plato? ---No  Does the PT have any chronic conditions? (i.e. diabetes, asthma, etc.) ---Yes  List chronic conditions. ---See MR  Is this a behavioral health or substance abuse call? ---No     Guidelines    Guideline Title Affirmed Question Affirmed Notes  Leg Swelling and Edema [1] MILD swelling of both ankles (i.e., pedal edema) AND [2] new onset or worsening    Final Disposition User   See PCP When Office is Open (within 3 days) Standifer, RN, Research scientist (physical sciences)    Comments  Appt made for 12:15 on Wednesday 03/29 with Dr. Dallas Schimke   Referrals  REFERRED TO PCP OFFICE   Disagree/Comply: Comply

## 2015-06-29 ENCOUNTER — Encounter: Payer: Self-pay | Admitting: Family Medicine

## 2015-06-29 ENCOUNTER — Ambulatory Visit (INDEPENDENT_AMBULATORY_CARE_PROVIDER_SITE_OTHER): Payer: Medicare Other | Admitting: Family Medicine

## 2015-06-29 VITALS — BP 140/50 | HR 67 | Temp 97.7°F | Ht 60.0 in | Wt 137.5 lb

## 2015-06-29 DIAGNOSIS — R5383 Other fatigue: Secondary | ICD-10-CM

## 2015-06-29 DIAGNOSIS — I252 Old myocardial infarction: Secondary | ICD-10-CM

## 2015-06-29 DIAGNOSIS — R0600 Dyspnea, unspecified: Secondary | ICD-10-CM | POA: Diagnosis not present

## 2015-06-29 DIAGNOSIS — Z951 Presence of aortocoronary bypass graft: Secondary | ICD-10-CM

## 2015-06-29 DIAGNOSIS — I25708 Atherosclerosis of coronary artery bypass graft(s), unspecified, with other forms of angina pectoris: Secondary | ICD-10-CM

## 2015-06-29 DIAGNOSIS — R600 Localized edema: Secondary | ICD-10-CM | POA: Diagnosis not present

## 2015-06-29 DIAGNOSIS — J439 Emphysema, unspecified: Secondary | ICD-10-CM

## 2015-06-29 LAB — CBC WITH DIFFERENTIAL/PLATELET
Basophils Absolute: 0 10*3/uL (ref 0.0–0.1)
Basophils Relative: 0.4 % (ref 0.0–3.0)
Eosinophils Absolute: 0.4 10*3/uL (ref 0.0–0.7)
Eosinophils Relative: 4.2 % (ref 0.0–5.0)
HCT: 33.6 % — ABNORMAL LOW (ref 36.0–46.0)
Hemoglobin: 10.8 g/dL — ABNORMAL LOW (ref 12.0–15.0)
Lymphocytes Relative: 19.6 % (ref 12.0–46.0)
Lymphs Abs: 1.9 10*3/uL (ref 0.7–4.0)
MCHC: 32.2 g/dL (ref 30.0–36.0)
MCV: 86 fl (ref 78.0–100.0)
Monocytes Absolute: 0.7 10*3/uL (ref 0.1–1.0)
Monocytes Relative: 7.1 % (ref 3.0–12.0)
Neutro Abs: 6.8 10*3/uL (ref 1.4–7.7)
Neutrophils Relative %: 68.7 % (ref 43.0–77.0)
Platelets: 302 10*3/uL (ref 150.0–400.0)
RBC: 3.91 Mil/uL (ref 3.87–5.11)
RDW: 16.4 % — ABNORMAL HIGH (ref 11.5–15.5)
WBC: 9.9 10*3/uL (ref 4.0–10.5)

## 2015-06-29 LAB — HEPATIC FUNCTION PANEL
ALT: 9 U/L (ref 0–35)
AST: 12 U/L (ref 0–37)
Albumin: 3.8 g/dL (ref 3.5–5.2)
Alkaline Phosphatase: 53 U/L (ref 39–117)
Bilirubin, Direct: 0.1 mg/dL (ref 0.0–0.3)
Total Bilirubin: 0.3 mg/dL (ref 0.2–1.2)
Total Protein: 7.4 g/dL (ref 6.0–8.3)

## 2015-06-29 LAB — TSH: TSH: 1.22 u[IU]/mL (ref 0.35–4.50)

## 2015-06-29 NOTE — Progress Notes (Signed)
Dr. Karleen Hampshire T. Warrene Kapfer, MD, CAQ Sports Medicine [facility] [address] [place], [address] Phone: [phone] Fax: [phone]  06/29/2015  Patient: [name], MRN: [id number], DOB: March 07, 1932, 82 y.o.  Primary Physician:  Hannah Beat, MD   Chief Complaint  Patient presents with  . Leg & Feet Swelling   Subjective:   [name] is a 82 y.o. very pleasant female patient who presents with the following:  Feet and ankles are swelling.  Couple of Gartner - bad in the morning.   Complex medical patient with a history of multiple heart attacks, severe COPD, on oxygen, chronic respiratory failure, history of bypass surgery who presents with worsening feet and ankles and some dyspnea.  Primary concern is her swelling in her legs and feet.  Most recent echocardiogram showed an EF of 55%.  Recent kidney function has been normal.  She is eating and drinking normally.  She is accompanied by her son.  Past Medical History, Surgical History, Social History, Family History, Problem List, Medications, and Allergies have been reviewed and updated if relevant.  Patient Active Problem List   Diagnosis Date Noted  . Chronic respiratory failure with hypoxia (HCC) 04/14/2015    Priority: High  . History of non-ST elevation myocardial infarction (NSTEMI) 07/22/2011    Priority: High  . GIB (gastrointestinal bleeding) 04/25/2011    Priority: High  . COPD with emphysema (HCC) 05/31/2004    Priority: High  . CAD (coronary artery disease) 09/01/2002    Priority: High  . Chronic back pain greater than 3 months duration 11/30/2014    Priority: Medium  . Smoking 02/09/2011    Priority: Medium  . AAA (abdominal aortic aneurysm) (HCC) 12/22/2010    Priority: Medium  . HYPERLIPIDEMIA 07/02/1999    Priority: Medium  . Diabetes mellitus type 2 with neurological manifestations (HCC) 07/31/1997    Priority: Medium  . Essential hypertension, benign 08/01/1995      Priority: Medium  . Rheumatoid arthritis(714.0) 04/03/1983    Priority: Medium  . Rectal bleeding 04/20/2015  . COPD exacerbation (HCC) 04/05/2015  . Pedal edema 08/31/2014  . Dyspnea 08/31/2014  . 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 PAF (paroxysmal atrial fibrillation) (HCC) 12/16/2013  . History of GI diverticular bleed 09/25/2012  . Depression 06/17/2012  . Hx of CABG 04/18/2012  . Acute on chronic diastolic CHF (congestive heart failure) (HCC) 02/05/2012  . Splenic artery aneurysm (HCC)   . DIVERTICULOSIS OF COLON 05/14/2008  . ANXIETY DEPRESSION 04/19/2008  . ECZEMA 07/21/2007  . ARTERIOVENOUS MALFORMATION, GASTRIC 11/08/2006  . INSOMNIA, CHRONIC 07/21/2006  . ANEMIA-IRON DEFICIENCY 07/01/2004  . FATTY LIVER DISEASE 07/16/2002  . OSTEOPOROSIS 12/31/2000  . OSTEOARTHRITIS 04/03/1983    Past Medical History  Diagnosis Date  . Hypertension 08/01/1995  . Rheumatoid arthritis(714.0) 04/03/1983  . Osteoarthritis 04/03/1983  . Anemia, deficiency 07/01/2004    (w/u by Dr. Lorre Nick)  . Diabetes mellitus type II 07/31/1997  . Osteoporosis 12/31/2000  . CAD (coronary artery disease) 09/01/2002    a. s/p NSTEMI 2012. b. s/p CABG 01/2012.  [name] Hyperlipemia 07/02/1999  . COPD (chronic obstructive pulmonary disease) (HCC) 05/31/2004  . diverticulosis of colon   . Arteriovenous malformation of gastrointestinal tract   . Fatty liver   . Chronic back pain   . Splenic artery aneurysm (HCC)   . Anxiety and depression   . GI bleed     a. 04/2011 felt diverticular requiring microembolization  by vascular.  . Adrenal mass (HCC)     a. Excision 2007.  [name] Restless leg syndrome   . C. difficile colitis     a. 04/2008 = severe.  [name] Hx of CABG 04/18/2012  . PAF (paroxysmal atrial fibrillation) (HCC) 12/16/2013  . 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 Chronic back pain greater than 3 months duration 11/30/2014  . CHF (congestive heart failure)  [facility])     Past Surgical History  Procedure Laterality Date  . Nasal sinus surgery    . Inguinal hernia repair    . Partial hysterectomy      ovaries intact, dysmennorhea w/ A/P repari  . Carpal tunnel release      bilat  . Trigger finger release      right  . Wrist surgery      neuroma repair, right  . Ankle surgery      neuroma repair, right ORIF  . Total shoulder replacement  12/13/1998    right, ([place])  . Rotator cuff repair  11/22/2003    ([facility]) left  . Epidural block injection  05/2005    x 3  . Long finger tendon realignment  10/31/2005    [place]  . Lumbar epidural  06/13/2006  . Laminectomy  06/2006    L4/5 spinal stenosis (Dr. [name])  . Laminectomy  02/24/2008    L3/4 Ant/Lat interbody fusion and Lat Artthrodesis w/plate using XLIF (Dr. [name])  . Colonic embolization  04/18/2011    Dr. Wyn Quaker, acute GI bleed  . Spinal cord stimulator implant    . Coronary artery bypass graft  01/22/2012    Procedure: CORONARY ARTERY BYPASS GRAFTING (CABG);  Surgeon: Delight Ovens, MD;  Location: [facility] OR;  Service: Open Heart Surgery;  Laterality: N/A;  Coronary Artery Bypass Graft times four utilizing the left internal mammary artery and the left greater saphenous vein harvested endoscopically.  Rhae Hammock without cardioversion  01/22/2012    Procedure: TRANSESOPHAGEAL ECHOCARDIOGRAM (TEE);  Surgeon: Delight Ovens, MD;  Location: [facility] OR;  Service: Open Heart Surgery;  Laterality: N/A;    Social History   Social History  . Marital Status: Widowed    Spouse Name: N/A  . Number of Children: 4  . Years of Education: N/A   Occupational History  . [employer]     9 hour days-6 days/week   Social History Main Topics  . Smoking status: Former Smoker -- 0.10 packs/day for 50 years    Types: Cigarettes    Quit date: 12/18/2014  . Smokeless tobacco: Former Neurosurgeon  . Alcohol Use: 0.0 oz/week    0 Standard drinks or equivalent per week     Comment: red wine occassionally  . Drug Use: No    . Sexual Activity: No   Other Topics Concern  . Not on file   Social History Narrative   Married, widow 82          Family History  Problem Relation Age of Onset  . Hypotension Mother   . Dementia Mother   . Alcohol abuse Father   . Heart disease Father     MI, enlarged heart  . Diabetes Brother     Allergies  Allergen Reactions  . Other Diarrhea, Rash and Other (See Comments)    Pt states that she is allergic to all -mycins.   Reaction: Sore tongue, raw mouth, and weakness  . Penicillins Other (See Comments)    Reaction:  Dizziness  Has patient  had a PCN reaction causing immediate rash, facial/tongue/throat swelling, SOB or lightheadedness with hypotension: No Has patient had a PCN reaction causing severe rash involving mucus membranes or skin necrosis: No Has patient had a PCN reaction that required hospitalization No Has patient had a PCN reaction occurring within the last 10 years: No If all of the above answers are "NO", then may proceed with Cephalosporin use.  . Sulfa Antibiotics Shortness Of Breath  . Morphine Sulfate Nausea And Vomiting and Other (See Comments)    Reaction:  Hallucinations   . Macrolides And Ketolides Diarrhea and Nausea And Vomiting  . Iron Diarrhea  . Naproxen Rash    Medication list reviewed and updated in full in [facility].   GEN: No acute illnesses, no fevers, chills. GI: No n/v/d, eating normally Pulm: No SOB Interactive and getting along well at home.  Otherwise, ROS is as per the HPI.  Objective:   BP 140/50 mmHg  Pulse 67  Temp(Src) 97.7 F (36.5 C) (Oral)  Ht 5' (1.524 m)  Wt 137 lb 8 oz (62.37 kg)  BMI 26.85 kg/m2  SpO2 92%  GEN: WDWN, NAD, Non-toxic, A & O x 3 HEENT: Atraumatic, Normocephalic. Neck supple. No masses, No LAD. Ears and Nose: No external deformity. CV: RRR, No M/G/R. No JVD. No thrill. No extra heart sounds. PULM: CTA B, scattered wheezing, crackles, rhonchi. No retractions. No resp.  distress. No accessory muscle use. EXTR: No c/c/2+ pedal edema NEURO Normal gait.  PSYCH: Normally interactive. Conversant. Not depressed or anxious appearing.  Calm demeanor.   Laboratory and Imaging Data: Results for orders placed or performed in visit on 06/29/15  Hepatic function panel  Result Value Ref Range   Total Bilirubin 0.3 0.2 - 1.2 mg/dL   Bilirubin, Direct 0.1 0.0 - 0.3 mg/dL   Alkaline Phosphatase 53 39 - 117 U/L   AST 12 0 - 37 U/L   ALT 9 0 - 35 U/L   Total Protein 7.4 6.0 - 8.3 g/dL   Albumin 3.8 3.5 - 5.2 g/dL  CBC with Differential/Platelet  Result Value Ref Range   WBC 9.9 4.0 - 10.5 K/uL   RBC 3.91 3.87 - 5.11 Mil/uL   Hemoglobin 10.8 (L) 12.0 - 15.0 g/dL   HCT 16.1 (L) 09.6 - 04.5 %   MCV 86.0 78.0 - 100.0 fl   MCHC 32.2 30.0 - 36.0 g/dL   RDW 40.9 (H) 81.1 - 91.4 %   Platelets 302.0 150.0 - 400.0 K/uL   Neutrophils Relative % 68.7 43.0 - 77.0 %   Lymphocytes Relative 19.6 12.0 - 46.0 %   Monocytes Relative 7.1 3.0 - 12.0 %   Eosinophils Relative 4.2 0.0 - 5.0 %   Basophils Relative 0.4 0.0 - 3.0 %   Neutro Abs 6.8 1.4 - 7.7 K/uL   Lymphs Abs 1.9 0.7 - 4.0 K/uL   Monocytes Absolute 0.7 0.1 - 1.0 K/uL   Eosinophils Absolute 0.4 0.0 - 0.7 K/uL   Basophils Absolute 0.0 0.0 - 0.1 K/uL  TSH  Result Value Ref Range   TSH 1.22 0.35 - 4.50 uIU/mL     Assessment and Plan:   Coronary artery disease involving coronary bypass graft with other forms of angina pectoris (HCC) - Plan: Hepatic function panel, CBC with Differential/Platelet, TSH, Echocardiogram  Other fatigue - Plan: Hepatic function panel, CBC with Differential/Platelet, TSH  Dyspnea - Plan: Echocardiogram  Pedal edema  Hx of CABG  Pulmonary emphysema, unspecified emphysema type (HCC)  History of non-ST elevation myocardial infarction (NSTEMI)  Level of Medical Decision-Making in this case is Moderate.   All very reassuring.  Labs are reassuring.  I checked an echocardiogram since the  patient has not had one in 18 months, and the patient's ejection fraction is 55-60%.   My suspicion is that this is all dependent edema.  Dyspnea is likely coming from her chronic respiratory failure, chronic severe COPD.  She also has an upcoming cardiology follow-up additionally.  Follow-up: No Follow-up on file.  Orders Placed This Encounter  Procedures  . Hepatic function panel  . CBC with Differential/Platelet  . TSH  . Echocardiogram    Signed,  Mikhael Hendriks T. Susie Pousson, MD   Patient's Medications  New Prescriptions   No medications on file  Previous Medications   ACIDOPHILUS (RISAQUAD) CAPS CAPSULE    Take 1 capsule by mouth daily.   ALBUTEROL (PROVENTIL HFA;VENTOLIN HFA) 108 (90 BASE) MCG/ACT INHALER    Inhale 2 puffs into the lungs every 6 (six) hours as needed for wheezing or shortness of breath.   ALBUTEROL (PROVENTIL) (2.5 MG/3ML) 0.083% NEBULIZER SOLUTION    Take 2.5 mg by nebulization every 4 (four) hours as needed for wheezing or shortness of breath.   AMLODIPINE (NORVASC) 5 MG TABLET    Take 1 tablet (5 mg total) by mouth daily.   ASPIRIN EC 81 MG TABLET    Take 1 tablet (81 mg total) by mouth daily.   ATORVASTATIN (LIPITOR) 40 MG TABLET    Take 1 tablet (40 mg total) by mouth daily.   DOCUSATE SODIUM (COLACE) 100 MG CAPSULE    Take 100 mg by mouth 2 (two) times daily as needed for mild constipation.   FLUTICASONE-SALMETEROL (ADVAIR DISKUS) 500-50 MCG/DOSE AEPB    Inhale 1 puff into the lungs 2 (two) times daily.   IPRATROPIUM (ATROVENT) 0.02 % NEBULIZER SOLUTION       MELATONIN 5 MG TABS    Take 5 mg by mouth at bedtime as needed (for sleep).    METOPROLOL (LOPRESSOR) 50 MG TABLET    Take 1 tablet (50 mg total) by mouth 2 (two) times daily.   MORPHINE (MS CONTIN) 15 MG 12 HR TABLET    Take 15 mg by mouth every 12 (twelve) hours.   MORPHINE (MS CONTIN) 30 MG 12 HR TABLET    Take 30 mg by mouth every 12 (twelve) hours.   NITROGLYCERIN (NITROSTAT) 0.4 MG SL TABLET    Place  0.4 mg under the tongue every 5 (five) minutes as needed for chest pain.   OMEGA-3 FATTY ACIDS (FISH OIL) 1000 MG CAPS    Take 1,000 mg by mouth daily.    OXYCODONE (OXYCONTIN) 30 MG 12 HR TABLET    Take 30 mg by mouth at bedtime.    RAMIPRIL (ALTACE) 5 MG CAPSULE    Take 2 capsules (10 mg total) by mouth at bedtime.   TIOTROPIUM (SPIRIVA) 18 MCG INHALATION CAPSULE    Place 1 capsule (18 mcg total) into inhaler and inhale daily.   TIZANIDINE (ZANAFLEX) 4 MG TABLET    Take 2-4 mg by mouth at bedtime as needed for muscle spasms.   VITAMIN E PO    Take 1 capsule by mouth daily.   Modified Medications   No medications on file  Discontinued Medications   No medications on file

## 2015-06-29 NOTE — Progress Notes (Signed)
Pre visit review using our clinic review tool, if applicable. No additional management support is needed unless otherwise documented below in the visit note. 

## 2015-06-30 ENCOUNTER — Ambulatory Visit (INDEPENDENT_AMBULATORY_CARE_PROVIDER_SITE_OTHER): Payer: Medicare Other

## 2015-06-30 ENCOUNTER — Other Ambulatory Visit: Payer: Self-pay

## 2015-06-30 DIAGNOSIS — R0600 Dyspnea, unspecified: Secondary | ICD-10-CM | POA: Diagnosis not present

## 2015-06-30 DIAGNOSIS — I25708 Atherosclerosis of coronary artery bypass graft(s), unspecified, with other forms of angina pectoris: Secondary | ICD-10-CM

## 2015-07-19 ENCOUNTER — Ambulatory Visit: Payer: Medicare Other | Admitting: Nurse Practitioner

## 2015-07-19 ENCOUNTER — Other Ambulatory Visit: Payer: Medicare Other

## 2015-07-20 ENCOUNTER — Ambulatory Visit: Payer: Medicare Other | Admitting: Cardiology

## 2015-08-09 ENCOUNTER — Telehealth: Payer: Self-pay

## 2015-08-09 NOTE — Telephone Encounter (Signed)
French Ana pts daughter left v/m requesting med refill but cannot remember the name of med; French Ana request refills to [facility] in [place] [phone]. Left /vm requesting cb.

## 2015-08-10 ENCOUNTER — Telehealth: Payer: Self-pay

## 2015-08-10 MED ORDER — IPRATROPIUM BROMIDE 0.02 % IN SOLN: 0.5000 mg | Freq: Four times a day (QID) | RESPIRATORY_TRACT | Status: DC | PRN

## 2015-08-10 NOTE — Telephone Encounter (Signed)
Yes, can you help me do this - she has severe COPD, this would be justified.

## 2015-08-10 NOTE — Telephone Encounter (Signed)
Refill sent as requested.  Advised pharmacy to file under her Medicare Part B.  Medicare Part D will not cover nebulizer solution for home use.

## 2015-08-10 NOTE — Telephone Encounter (Signed)
French Ana pts daughter left v/m;Tracy took pt to [facility] in [facility] and pt was given ipratropium neb solution. A CMN certificate of medical necessity form was required by medicare before filling the rx. French Ana said pt is out of the ipratropium; The doctor pt saw at the UC is not available today and French Ana wants to know if Dr Patsy Lager could send in the ipratropium neb solution and filling out the CMN form so pt can get the medication. French Ana request cb.[facility] [place].

## 2015-08-11 NOTE — Telephone Encounter (Signed)
Left message for French Ana that prescription has been sent as requested.  Also advised to make sure pharmacy is filing it under Medicare Part B and not Medicare Part D.

## 2015-08-12 NOTE — Telephone Encounter (Signed)
Error

## 2015-08-16 NOTE — Telephone Encounter (Signed)
Tracy left v/m requesting cb about neb solution and request CMA contact pharmacy at [phone].

## 2015-08-17 NOTE — Telephone Encounter (Addendum)
Spoke with pharmacist at [facility].  They are needing a CMN form completed for the Ipratropium Neb Solution.  Form completed and faxed back to [facility] at [phone].  French Ana notified.

## 2015-09-14 ENCOUNTER — Emergency Department: Payer: Medicare Other

## 2015-09-14 ENCOUNTER — Emergency Department
Admission: EM | Admit: 2015-09-14 | Discharge: 2015-09-14 | Disposition: A | Discharge status: Home / Self Care | Payer: Medicare Other | Attending: Emergency Medicine | Admitting: Emergency Medicine

## 2015-09-14 DIAGNOSIS — Z7951 Long term (current) use of inhaled steroids: Secondary | ICD-10-CM | POA: Insufficient documentation

## 2015-09-14 DIAGNOSIS — Z8679 Personal history of other diseases of the circulatory system: Secondary | ICD-10-CM | POA: Diagnosis not present

## 2015-09-14 DIAGNOSIS — I110 Hypertensive heart disease with heart failure: Secondary | ICD-10-CM | POA: Diagnosis not present

## 2015-09-14 DIAGNOSIS — Z79899 Other long term (current) drug therapy: Secondary | ICD-10-CM | POA: Diagnosis not present

## 2015-09-14 DIAGNOSIS — Z7982 Long term (current) use of aspirin: Secondary | ICD-10-CM | POA: Insufficient documentation

## 2015-09-14 DIAGNOSIS — R0602 Shortness of breath: Secondary | ICD-10-CM | POA: Diagnosis present

## 2015-09-14 DIAGNOSIS — E785 Hyperlipidemia, unspecified: Secondary | ICD-10-CM | POA: Diagnosis not present

## 2015-09-14 DIAGNOSIS — E119 Type 2 diabetes mellitus without complications: Secondary | ICD-10-CM | POA: Insufficient documentation

## 2015-09-14 DIAGNOSIS — M1990 Unspecified osteoarthritis, unspecified site: Secondary | ICD-10-CM | POA: Diagnosis not present

## 2015-09-14 DIAGNOSIS — J449 Chronic obstructive pulmonary disease, unspecified: Secondary | ICD-10-CM | POA: Diagnosis not present

## 2015-09-14 DIAGNOSIS — Z87891 Personal history of nicotine dependence: Secondary | ICD-10-CM | POA: Insufficient documentation

## 2015-09-14 DIAGNOSIS — J9611 Chronic respiratory failure with hypoxia: Secondary | ICD-10-CM | POA: Diagnosis not present

## 2015-09-14 DIAGNOSIS — M810 Age-related osteoporosis without current pathological fracture: Secondary | ICD-10-CM | POA: Diagnosis not present

## 2015-09-14 DIAGNOSIS — Z951 Presence of aortocoronary bypass graft: Secondary | ICD-10-CM | POA: Insufficient documentation

## 2015-09-14 DIAGNOSIS — F329 Major depressive disorder, single episode, unspecified: Secondary | ICD-10-CM | POA: Diagnosis not present

## 2015-09-14 DIAGNOSIS — I2510 Atherosclerotic heart disease of native coronary artery without angina pectoris: Secondary | ICD-10-CM | POA: Insufficient documentation

## 2015-09-14 DIAGNOSIS — J441 Chronic obstructive pulmonary disease with (acute) exacerbation: Secondary | ICD-10-CM

## 2015-09-14 DIAGNOSIS — I5033 Acute on chronic diastolic (congestive) heart failure: Secondary | ICD-10-CM | POA: Diagnosis not present

## 2015-09-14 DIAGNOSIS — M961 Postlaminectomy syndrome, not elsewhere classified: Secondary | ICD-10-CM | POA: Insufficient documentation

## 2015-09-14 LAB — BASIC METABOLIC PANEL
ANION GAP: 9 (ref 5–15)
BUN: 20 mg/dL (ref 6–20)
CALCIUM: 9 mg/dL (ref 8.9–10.3)
CO2: 28 mmol/L (ref 22–32)
CREATININE: 0.92 mg/dL (ref 0.44–1.00)
Chloride: 100 mmol/L — ABNORMAL LOW (ref 101–111)
GFR calc non Af Amer: 57 mL/min — ABNORMAL LOW (ref >60)
Glucose, Bld: 166 mg/dL — ABNORMAL HIGH (ref 65–99)
Potassium: 3.6 mmol/L (ref 3.5–5.1)
SODIUM: 137 mmol/L (ref 135–145)

## 2015-09-14 LAB — CBC WITH DIFFERENTIAL/PLATELET
Basophils Absolute: 0 10*3/uL (ref 0–0.1)
Basophils Relative: 0 %
Eosinophils Absolute: 0.1 10*3/uL (ref 0–0.7)
Eosinophils Relative: 1 %
HCT: 33.8 % — ABNORMAL LOW (ref 35.0–47.0)
HEMOGLOBIN: 11.1 g/dL — AB (ref 12.0–16.0)
Lymphocytes Relative: 6 %
Lymphs Abs: 0.7 10*3/uL — ABNORMAL LOW (ref 1.0–3.6)
MCH: 26.3 pg (ref 26.0–34.0)
MCHC: 32.7 g/dL (ref 32.0–36.0)
MCV: 80.3 fL (ref 80.0–100.0)
Monocytes Absolute: 0.3 10*3/uL (ref 0.2–0.9)
Monocytes Relative: 3 %
NEUTROS ABS: 10.4 10*3/uL — AB (ref 1.4–6.5)
Platelets: 277 10*3/uL (ref 150–440)
RBC: 4.21 MIL/uL (ref 3.80–5.20)
RDW: 16.5 % — ABNORMAL HIGH (ref 11.5–14.5)
WBC: 11.5 10*3/uL — AB (ref 3.6–11.0)

## 2015-09-14 MED ORDER — LEVOFLOXACIN 750 MG PO TABS
750.0000 mg | ORAL_TABLET | Freq: Once | ORAL | Status: AC
  Administered 2015-09-14: 750 mg via ORAL
  Filled 2015-09-14: qty 1

## 2015-09-14 MED ORDER — ALBUTEROL SULFATE (2.5 MG/3ML) 0.083% IN NEBU
5.0000 mg | INHALATION_SOLUTION | Freq: Once | RESPIRATORY_TRACT | Status: AC
  Administered 2015-09-14: 5 mg via RESPIRATORY_TRACT
  Filled 2015-09-14: qty 6

## 2015-09-14 MED ORDER — LEVOFLOXACIN 750 MG PO TABS: 750.0000 mg | ORAL_TABLET | Freq: Every day | ORAL | Status: DC

## 2015-09-14 MED ORDER — PREDNISONE 20 MG PO TABS: 40.0000 mg | ORAL_TABLET | Freq: Every day | ORAL | Status: DC

## 2015-09-14 NOTE — Discharge Instructions (Signed)

## 2015-09-14 NOTE — ED Notes (Signed)
Pt to ED via EMS from home c/o SOB. Per EMS pt has been having SOB for the past week, pt has a hx of COPD, but today breathing got progressively worse. EMS reported auscultating diminished breath sounds all over, placed pt on 4Ls nasal canula (94%), gave 1 duo neb, as well 125 mg solumedrol. Pt alert and oriented in no acute distress at this time.

## 2015-09-14 NOTE — ED Notes (Signed)
Pt hypertensive at discharge, MD made aware.

## 2015-09-14 NOTE — ED Provider Notes (Signed)
[facility] Emergency Department Provider Note  ____________________________________________  Time seen: 5:25 PM  I have reviewed the triage vital signs and the nursing notes.   HISTORY  Chief Complaint Shortness of Breath    HPI [name] is a 82 y.o. female complains of shortness of breath and a nonproductive cough over the past 2 [place]. She has a history of COPD and is on 4 L nasal cannula oxygen at home. Denies chest pain. No runny nose or sore throat. No abdominal pain. Positive for chronic back pain.     Past Medical History  Diagnosis Date  . Hypertension 08/01/1995  . Rheumatoid arthritis(714.0) 04/03/1983  . Osteoarthritis 04/03/1983  . Anemia, deficiency 07/01/2004    (w/u by Dr. Lorre Nick)  . Diabetes mellitus type II 07/31/1997  . Osteoporosis 12/31/2000  . CAD (coronary artery disease) 09/01/2002    a. s/p NSTEMI 2012. b. s/p CABG 01/2012.  [name] Hyperlipemia 07/02/1999  . COPD (chronic obstructive pulmonary disease) (HCC) 05/31/2004  . diverticulosis of colon   . Arteriovenous malformation of gastrointestinal tract   . Fatty liver   . Chronic back pain   . Splenic artery aneurysm (HCC)   . Anxiety and depression   . GI bleed     a. 04/2011 felt diverticular requiring microembolization by vascular.  . Adrenal mass (HCC)     a. Excision 2007.  [name] Restless leg syndrome   . C. difficile colitis     a. 04/2008 = severe.  [name] Hx of CABG 04/18/2012  . PAF (paroxysmal atrial fibrillation) (HCC) 12/16/2013  . 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 Chronic back pain greater than 3 months duration 11/30/2014  . CHF (congestive heart failure) [facility])      Patient Active Problem List   Diagnosis Date Noted  . Chronic respiratory failure with hypoxia (HCC) 04/14/2015  . Failed back syndrome of lumbar spine 11/30/2014  . Pedal edema 08/31/2014  . 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 PAF  (paroxysmal atrial fibrillation) (HCC) 12/16/2013  . History of GI diverticular bleed 09/25/2012  . Depression 06/17/2012  . Hx of CABG 04/18/2012  . Acute on chronic diastolic CHF (congestive heart failure) (HCC) 02/05/2012  . Splenic artery aneurysm (HCC)   . History of non-ST elevation myocardial infarction (NSTEMI) 07/22/2011  . GIB (gastrointestinal bleeding) 04/25/2011  . Smoking 02/09/2011  . AAA (abdominal aortic aneurysm) (HCC) 12/22/2010  . DIVERTICULOSIS OF COLON 05/14/2008  . ANXIETY DEPRESSION 04/19/2008  . ECZEMA 07/21/2007  . ARTERIOVENOUS MALFORMATION, GASTRIC 11/08/2006  . INSOMNIA, CHRONIC 07/21/2006  . ANEMIA-IRON DEFICIENCY 07/01/2004  . 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 CAD (coronary artery disease) 09/01/2002  . FATTY LIVER DISEASE 07/16/2002  . OSTEOPOROSIS 12/31/2000  . HYPERLIPIDEMIA 07/02/1999  . 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 Essential hypertension, benign 08/01/1995  . Rheumatoid arthritis(714.0) 04/03/1983  . OSTEOARTHRITIS 04/03/1983     Past Surgical History  Procedure Laterality Date  . Nasal sinus surgery    . Inguinal hernia repair    . Partial hysterectomy      ovaries intact, dysmennorhea w/ A/P repari  . Carpal tunnel release      bilat  . Trigger finger release      right  . Wrist surgery      neuroma repair, right  . Ankle surgery      neuroma repair, right ORIF  . Total shoulder replacement  12/13/1998  right, ([place])  . Rotator cuff repair  11/22/2003    ([place]) left  . Epidural block injection  05/2005    x 3  . Long finger tendon realignment  10/31/2005    [place]  . Lumbar epidural  06/13/2006  . Laminectomy  06/2006    L4/5 spinal stenosis (Dr. [name])  . Laminectomy  02/24/2008    L3/4 Ant/Lat interbody fusion and Lat Artthrodesis w/plate using XLIF (Dr. [name])  . Colonic embolization  04/18/2011    Dr. Wyn Quaker, acute GI bleed  . Spinal cord stimulator implant     . Coronary artery bypass graft  01/22/2012    Procedure: CORONARY ARTERY BYPASS GRAFTING (CABG);  Surgeon: Delight Ovens, MD;  Location: [facility] OR;  Service: Open Heart Surgery;  Laterality: N/A;  Coronary Artery Bypass Graft times four utilizing the left internal mammary artery and the left greater saphenous vein harvested endoscopically.  Rhae Hammock without cardioversion  01/22/2012    Procedure: TRANSESOPHAGEAL ECHOCARDIOGRAM (TEE);  Surgeon: Delight Ovens, MD;  Location: [facility] OR;  Service: Open Heart Surgery;  Laterality: N/A;     Current Outpatient Rx  Name  Route  Sig  Dispense  Refill  . acidophilus (RISAQUAD) CAPS capsule   Oral   Take 1 capsule by mouth daily.         [name] albuterol (PROVENTIL HFA;VENTOLIN HFA) 108 (90 Base) MCG/ACT inhaler   Inhalation   Inhale 2 puffs into the lungs every 6 (six) hours as needed for wheezing or shortness of breath.         [name] albuterol (PROVENTIL) (2.5 MG/3ML) 0.083% nebulizer solution   Nebulization   Take 2.5 mg by nebulization every 4 (four) hours as needed for wheezing or shortness of breath.         [name] amLODipine (NORVASC) 5 MG tablet   Oral   Take 1 tablet (5 mg total) by mouth daily.   30 tablet   5   . aspirin EC 81 MG tablet   Oral   Take 1 tablet (81 mg total) by mouth daily.   90 tablet   3   . atorvastatin (LIPITOR) 40 MG tablet   Oral   Take 1 tablet (40 mg total) by mouth daily.   30 tablet   5   . docusate sodium (COLACE) 100 MG capsule   Oral   Take 100 mg by mouth 2 (two) times daily as needed for mild constipation.         . Fluticasone-Salmeterol (ADVAIR DISKUS) 500-50 MCG/DOSE AEPB   Inhalation   Inhale 1 puff into the lungs 2 (two) times daily.   60 each   11   . ipratropium (ATROVENT) 0.02 % nebulizer solution   Nebulization   Take 2.5 mLs (0.5 mg total) by nebulization every 6 (six) hours as needed for wheezing or shortness of breath.   75 mL   2     PLEASE FILE UNDER MEDICARE PART B-NEBULIZER  SOLUTI ...   . levofloxacin (LEVAQUIN) 750 MG tablet   Oral   Take 1 tablet (750 mg total) by mouth daily.   7 tablet   0   . Melatonin 5 MG TABS   Oral   Take 5 mg by mouth at bedtime as needed (for sleep).          . metoprolol (LOPRESSOR) 50 MG tablet   Oral   Take 1 tablet (50 mg total) by mouth 2 (two) times daily.   60  tablet   5   . morphine (MS CONTIN) 15 MG 12 hr tablet   Oral   Take 15 mg by mouth every 12 (twelve) hours.         [name] morphine (MS CONTIN) 30 MG 12 hr tablet   Oral   Take 30 mg by mouth every 12 (twelve) hours.         . nitroGLYCERIN (NITROSTAT) 0.4 MG SL tablet   Sublingual   Place 0.4 mg under the tongue every 5 (five) minutes as needed for chest pain.         . Omega-3 Fatty Acids (FISH OIL) 1000 MG CAPS   Oral   Take 1,000 mg by mouth daily.          [name] oxyCODONE (OXYCONTIN) 30 MG 12 hr tablet   Oral   Take 30 mg by mouth at bedtime.          . predniSONE (DELTASONE) 20 MG tablet   Oral   Take 2 tablets (40 mg total) by mouth daily.   8 tablet   0   . ramipril (ALTACE) 5 MG capsule   Oral   Take 2 capsules (10 mg total) by mouth at bedtime.   60 capsule   5   . tiotropium (SPIRIVA) 18 MCG inhalation capsule   Inhalation   Place 1 capsule (18 mcg total) into inhaler and inhale daily.   30 capsule   12   . tiZANidine (ZANAFLEX) 4 MG tablet   Oral   Take 2-4 mg by mouth at bedtime as needed for muscle spasms.         [name] VITAMIN E PO   Oral   Take 1 capsule by mouth daily.             Allergies Other; Penicillins; Sulfa antibiotics; Morphine sulfate; Macrolides and ketolides; Iron; and Naproxen   Family History  Problem Relation Age of Onset  . Hypotension Mother   . Dementia Mother   . Alcohol abuse Father   . Heart disease Father     MI, enlarged heart  . Diabetes Brother     Social History Social History  Substance Use Topics  . Smoking status: Former Smoker -- 0.10 packs/day for 50 years     Types: Cigarettes    Quit date: 12/18/2014  . Smokeless tobacco: Former Neurosurgeon  . Alcohol Use: 0.0 oz/week    0 Standard drinks or equivalent per week     Comment: red wine occassionally    Review of Systems  Constitutional:   No fever or chills.  Eyes:   No vision changes.  ENT:   No sore throat. No rhinorrhea. Cardiovascular:   No chest pain. Respiratory:   Positive shortness of breath and cough. Gastrointestinal:   Negative for abdominal pain, vomiting and diarrhea.  Genitourinary:   Negative for dysuria or difficulty urinating. Musculoskeletal:   Chronic low back pain Neurological:   Negative for headaches 10-point ROS otherwise negative.  ____________________________________________   PHYSICAL EXAM:  VITAL SIGNS: ED Triage Vitals  Enc Vitals Group     BP 09/14/15 1739 134/67 mmHg     Pulse Rate 09/14/15 1739 100     Resp 09/14/15 1739 20     Temp 09/14/15 1739 98.4 F (36.9 C)     Temp Source 09/14/15 1739 Oral     SpO2 09/14/15 1724 94 %     Weight 09/14/15 1739 130 lb (58.968 kg)     Height  09/14/15 1739 5' (1.524 m)     Head Cir --      Peak Flow --      Pain Score 09/14/15 1740 4     Pain Loc --      Pain Edu? --      Excl. in GC? --     Vital signs reviewed, nursing assessments reviewed.   Constitutional:   Alert and oriented. Ill-appearing Eyes:   No scleral icterus. No conjunctival pallor. PERRL. EOMI.  No nystagmus. ENT   Head:   Normocephalic and atraumatic.   Nose:   No congestion/rhinnorhea. No septal hematoma   Mouth/Throat:   MMM, no pharyngeal erythema. No peritonsillar mass.    Neck:   No stridor. No SubQ emphysema. No meningismus. Hematological/Lymphatic/Immunilogical:   No cervical lymphadenopathy. Cardiovascular:   RRR. Symmetric bilateral radial and DP pulses.  No murmurs.  Respiratory:   Slightly increased work of breathing. Mildly prolonged expiratory phase. Diffuse expiratory wheezing. Symmetric air entry in all lung  fields. No crackles. Gastrointestinal:   Soft and nontender. Non distended. There is no CVA tenderness.  No rebound, rigidity, or guarding. Genitourinary:   deferred Musculoskeletal:   Nontender with normal range of motion in all extremities. No joint effusions.  No lower extremity tenderness.  No edema. Neurologic:   Normal speech and language.  CN 2-10 normal. Motor grossly intact. No gross focal neurologic deficits are appreciated.  Skin:    Skin is warm, dry and intact. No rash noted.  No petechiae, purpura, or bullae.  ____________________________________________    LABS (pertinent positives/negatives) (all labs ordered are listed, but only abnormal results are displayed) Labs Reviewed  BASIC METABOLIC PANEL - Abnormal; Notable for the following:    Chloride 100 (*)    Glucose, Bld 166 (*)    GFR calc non Af Amer 57 (*)    All other components within normal limits  CBC WITH DIFFERENTIAL/PLATELET - Abnormal; Notable for the following:    WBC 11.5 (*)    Hemoglobin 11.1 (*)    HCT 33.8 (*)    RDW 16.5 (*)    Neutro Abs 10.4 (*)    Lymphs Abs 0.7 (*)    All other components within normal limits   ____________________________________________   EKG  Interpreted by me Sinus rhythm rate of 98, normal axis intervals. Poor R-wave progression in anterior precordial leads. Normal ST segments and T waves  ____________________________________________    RADIOLOGY  Chest x-ray unremarkable  ____________________________________________   PROCEDURES   ____________________________________________   INITIAL IMPRESSION / ASSESSMENT AND PLAN / ED COURSE  Pertinent labs & imaging results that were available during my care of the patient were reviewed by me and considered in my medical decision making (see chart for details).  Patient presents with shortness of breath cough and wheezing. Consistent with COPD exacerbation. Low suspicion for ACS dissection or PE. No Evidence  of sepsis. Patient given nebs with improvement of her wheezing. She is much more comfortable and actually sleeping in the ED with adequate oxygen saturation on her home O2. On recheck of her vitals her heart rate did come up slightly at about 105 which is think is due to the bronchodilator treatments. Lorna Dibble for outpatient follow-up. Prescriptions for Levaquin and prednisone and continue bronchodilators. Close follow-up with primary care.     ____________________________________________   FINAL CLINICAL IMPRESSION(S) / ED DIAGNOSES  Final diagnoses:  Chronic respiratory failure with hypoxia (HCC)  Failed back syndrome of lumbar spine  COPD exacerbation (  HCC)       Portions of this note were generated with dragon dictation software. Dictation errors may occur despite best attempts at proofreading.   Sharman Cheek, MD 09/14/15 2024

## 2015-09-20 ENCOUNTER — Encounter: Payer: Self-pay | Admitting: Family Medicine

## 2015-09-20 ENCOUNTER — Ambulatory Visit (INDEPENDENT_AMBULATORY_CARE_PROVIDER_SITE_OTHER): Payer: Medicare Other | Admitting: Family Medicine

## 2015-09-20 VITALS — BP 139/49 | HR 66 | Temp 98.3°F | Ht 60.0 in | Wt 137.0 lb

## 2015-09-20 DIAGNOSIS — J441 Chronic obstructive pulmonary disease with (acute) exacerbation: Secondary | ICD-10-CM | POA: Diagnosis not present

## 2015-09-20 DIAGNOSIS — J9611 Chronic respiratory failure with hypoxia: Secondary | ICD-10-CM | POA: Diagnosis not present

## 2015-09-20 DIAGNOSIS — R600 Localized edema: Secondary | ICD-10-CM

## 2015-09-20 DIAGNOSIS — Z9119 Patient's noncompliance with other medical treatment and regimen: Secondary | ICD-10-CM

## 2015-09-20 DIAGNOSIS — Z91199 Patient's noncompliance with other medical treatment and regimen due to unspecified reason: Secondary | ICD-10-CM

## 2015-09-20 MED ORDER — FLUTICASONE-SALMETEROL 500-50 MCG/DOSE IN AEPB: 1.0000 | INHALATION_SPRAY | Freq: Two times a day (BID) | RESPIRATORY_TRACT | Status: AC

## 2015-09-20 MED ORDER — TIOTROPIUM BROMIDE MONOHYDRATE 18 MCG IN CAPS: 18.0000 ug | ORAL_CAPSULE | Freq: Every day | RESPIRATORY_TRACT | Status: AC

## 2015-09-20 MED ORDER — PREDNISONE 20 MG PO TABS: ORAL_TABLET | ORAL | Status: DC

## 2015-09-20 MED ORDER — METHYLPREDNISOLONE ACETATE 80 MG/ML IJ SUSP
80.0000 mg | Freq: Once | INTRAMUSCULAR | Status: AC
  Administered 2015-09-20: 80 mg via INTRAMUSCULAR

## 2015-09-20 MED ORDER — CEFDINIR 300 MG PO CAPS: 600.0000 mg | ORAL_CAPSULE | Freq: Every day | ORAL | Status: DC

## 2015-09-20 NOTE — Progress Notes (Signed)
Dr. Karleen Hampshire T. Xan Ingraham, MD, CAQ Sports [facility] Sports [facility] [address] [place], [address] Phone: [phone] Fax: [phone]  09/20/2015  Patient: [name], MRN: [id number], DOB: [birth year], 82 y.o.  Primary Physician:  Hannah Beat, MD   Chief Complaint  Patient presents with  . Leg Swelling  . Follow-up    [facility] ED visit   Subjective:   [name] is a 82 y.o. very pleasant female patient who presents with the following:  The patient was seen in the ER at [facility] on 09/14/2015, and she was given a nebulizer, and sent out of Levaquin 750 mg x 7 days and prednisone 40 mg x 4 days only.  The history is significant in that the patient had been doing quite well and she had been staying with her daughter in [place], but when she returned to the area, she has become less compliant with doing her daily inhalers. According to her other family member who accompanies her today, she has not been doing her Advair twice a day, and she has been less compliant with doing her Spiriva. She has been more compliant with doing her nebulizer offing doing this 2 or 3 times a day.  Since the time for discharge, she has continued to not do all that well, and she remains on 4 liters of oxygen in an outpatient setting. She appears to be worse compared to her baseline today.  She is currently afebrile, but she does have cough that is productive of considerable sputum.  Thankfully, she has been able to discontinue smoking again recently  Past Medical History, Surgical History, Social History, Family History, Problem List, Medications, and Allergies have been reviewed and updated if relevant.  Patient Active Problem List   Diagnosis Date Noted  . Chronic respiratory failure with hypoxia (HCC) 04/14/2015    Priority: High  . History of non-ST elevation myocardial infarction (NSTEMI) 07/22/2011    Priority: High  . GIB (gastrointestinal bleeding) 04/25/2011     Priority: High  . COPD with emphysema (HCC) 05/31/2004    Priority: High  . CAD (coronary artery disease) 09/01/2002    Priority: High  . Failed back syndrome of lumbar spine 11/30/2014    Priority: Medium  . Smoking 02/09/2011    Priority: Medium  . AAA (abdominal aortic aneurysm) (HCC) 12/22/2010    Priority: Medium  . HYPERLIPIDEMIA 07/02/1999    Priority: Medium  . Diabetes mellitus type 2 with neurological manifestations (HCC) 07/31/1997    Priority: Medium  . Essential hypertension, benign 08/01/1995    Priority: Medium  . Rheumatoid arthritis(714.0) 04/03/1983    Priority: Medium  . Pedal edema 08/31/2014  . 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 PAF (paroxysmal atrial fibrillation) (HCC) 12/16/2013  . History of GI diverticular bleed 09/25/2012  . Depression 06/17/2012  . Hx of CABG 04/18/2012  . Acute on chronic diastolic CHF (congestive heart failure) (HCC) 02/05/2012  . Splenic artery aneurysm (HCC)   . DIVERTICULOSIS OF COLON 05/14/2008  . ANXIETY DEPRESSION 04/19/2008  . ECZEMA 07/21/2007  . ARTERIOVENOUS MALFORMATION, GASTRIC 11/08/2006  . INSOMNIA, CHRONIC 07/21/2006  . ANEMIA-IRON DEFICIENCY 07/01/2004  . FATTY LIVER DISEASE 07/16/2002  . OSTEOPOROSIS 12/31/2000  . OSTEOARTHRITIS 04/03/1983    Past Medical History  Diagnosis Date  . Hypertension 08/01/1995  . Rheumatoid arthritis(714.0) 04/03/1983  . Osteoarthritis 04/03/1983  . Anemia, deficiency 07/01/2004    (w/u by Dr. Lorre Nick)  . Diabetes mellitus  type II 07/31/1997  . Osteoporosis 12/31/2000  . CAD (coronary artery disease) 09/01/2002    a. s/p NSTEMI 2012. b. s/p CABG 01/2012.  [name] Hyperlipemia 07/02/1999  . COPD (chronic obstructive pulmonary disease) (HCC) 05/31/2004  . diverticulosis of colon   . Arteriovenous malformation of gastrointestinal tract   . Fatty liver   . Chronic back pain   . Splenic artery aneurysm (HCC)   . Anxiety and depression   . GI bleed       a. 04/2011 felt diverticular requiring microembolization by vascular.  . Adrenal mass (HCC)     a. Excision 2007.  [name] Restless leg syndrome   . C. difficile colitis     a. 04/2008 = severe.  [name] Hx of CABG 04/18/2012  . PAF (paroxysmal atrial fibrillation) (HCC) 12/16/2013  . 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 Chronic back pain greater than 3 months duration 11/30/2014  . CHF (congestive heart failure) [facility])     Past Surgical History  Procedure Laterality Date  . Nasal sinus surgery    . Inguinal hernia repair    . Partial hysterectomy      ovaries intact, dysmennorhea w/ A/P repari  . Carpal tunnel release      bilat  . Trigger finger release      right  . Wrist surgery      neuroma repair, right  . Ankle surgery      neuroma repair, right ORIF  . Total shoulder replacement  12/13/1998    right, ([place])  . Rotator cuff repair  11/22/2003    ([place]) left  . Epidural block injection  05/2005    x 3  . Long finger tendon realignment  10/31/2005    [place]  . Lumbar epidural  06/13/2006  . Laminectomy  06/2006    L4/5 spinal stenosis (Dr. [name])  . Laminectomy  02/24/2008    L3/4 Ant/Lat interbody fusion and Lat Artthrodesis w/plate using XLIF (Dr. [name])  . Colonic embolization  04/18/2011    Dr. Wyn Quaker, acute GI bleed  . Spinal cord stimulator implant    . Coronary artery bypass graft  01/22/2012    Procedure: CORONARY ARTERY BYPASS GRAFTING (CABG);  Surgeon: Delight Ovens, MD;  Location: [facility] OR;  Service: Open Heart Surgery;  Laterality: N/A;  Coronary Artery Bypass Graft times four utilizing the left internal mammary artery and the left greater saphenous vein harvested endoscopically.  Rhae Hammock without cardioversion  01/22/2012    Procedure: TRANSESOPHAGEAL ECHOCARDIOGRAM (TEE);  Surgeon: Delight Ovens, MD;  Location: [facility] OR;  Service: Open Heart Surgery;  Laterality: N/A;    Social History   Social History  . Marital Status: Widowed     Spouse Name: N/A  . Number of Children: 4  . Years of Education: N/A   Occupational History  . [employer]     9 hour days-6 days/week   Social History Main Topics  . Smoking status: Former Smoker -- 0.10 packs/day for 50 years    Types: Cigarettes    Quit date: 12/18/2014  . Smokeless tobacco: Former Neurosurgeon  . Alcohol Use: 0.0 oz/week    0 Standard drinks or equivalent per week     Comment: red wine occassionally  . Drug Use: No  . Sexual Activity: No   Other Topics Concern  . Not on file   Social History Narrative   Married, widow 77          Family History  Problem Relation Age of Onset  . Hypotension Mother   . Dementia Mother   . Alcohol abuse Father   . Heart disease Father     MI, enlarged heart  . Diabetes Brother     Allergies  Allergen Reactions  . Other Diarrhea, Rash and Other (See Comments)    Pt states that she is allergic to all -mycins.   Reaction: Sore tongue, raw mouth, and weakness  . Penicillins Other (See Comments)    Reaction:  Dizziness  Has patient had a PCN reaction causing immediate rash, facial/tongue/throat swelling, SOB or lightheadedness with hypotension: No Has patient had a PCN reaction causing severe rash involving mucus membranes or skin necrosis: No Has patient had a PCN reaction that required hospitalization No Has patient had a PCN reaction occurring within the last 10 years: No If all of the above answers are "NO", then may proceed with Cephalosporin use.  . Sulfa Antibiotics Shortness Of Breath  . Morphine Sulfate Nausea And Vomiting and Other (See Comments)    Reaction:  Hallucinations   . Macrolides And Ketolides Diarrhea and Nausea And Vomiting  . Iron Diarrhea  . Naproxen Rash    Medication list reviewed and updated in full in [place] [facility].  ROS: GEN: Acute illness details above GI: Tolerating PO intake GU: maintaining adequate hydration and urination Pulm: + SOB She is also concerned about her lower extremity  edema. She is not having any chest pain. Otherwise, the pertinent positives and negatives are listed above and in the HPI, otherwise a full review of systems has been reviewed and is negative unless noted positive.   Objective:   BP 139/49 mmHg  Pulse 66  Temp(Src) 98.3 F (36.8 C) (Oral)  Ht 5' (1.524 m)  Wt 137 lb (62.143 kg)  BMI 26.76 kg/m2  SpO2 95%   GEN: A and O x 3. WDWN. NAD.    ENT: Nose clear, ext NML.  No LAD.  No JVD.  TM's clear. Oropharynx clear.  PULM: mild increased WOB, no distress. No crackles, + diffuse wheezes, rare rhonchi. ABD: S, NT, ND, + BS, No rebound, No HSM  CV: RRR, no M/G/R, No rubs, No JVD.   EXT: warm and well-perfused, No c/c/1-2 + LE edema. PSYCH: Pleasant and conversant.    Laboratory and Imaging Data:    Chemistry      Component Value Date/Time   NA 137 09/14/2015 1836   NA 140 02/03/2014 1706   NA 140 05/31/2011 1038   K 3.6 09/14/2015 1836   K 4.2 02/03/2014 1706   CL 100* 09/14/2015 1836   CL 104 02/03/2014 1706   CO2 28 09/14/2015 1836   CO2 29 02/03/2014 1706   BUN 20 09/14/2015 1836   BUN 21* 02/03/2014 1706   BUN 25 05/31/2011 1038   CREATININE 0.92 09/14/2015 1836   CREATININE 0.84 02/03/2014 1706   CREATININE 0.97 03/21/2012 1659      Component Value Date/Time   CALCIUM 9.0 09/14/2015 1836   CALCIUM 8.6 02/03/2014 1706   ALKPHOS 53 06/29/2015 1321   ALKPHOS 59 02/03/2014 1706   AST 12 06/29/2015 1321   AST 23 02/03/2014 1706   ALT 9 06/29/2015 1321   ALT 17 02/03/2014 1706   BILITOT 0.3 06/29/2015 1321   BILITOT 0.3 02/03/2014 1706     Lab Results  Component Value Date   ALT 9 06/29/2015   AST 12 06/29/2015   ALKPHOS 53 06/29/2015   BILITOT 0.3 06/29/2015  CBC:    Component Value Date/Time   WBC 11.5* 09/14/2015 1836   WBC 7.2 02/03/2014 1706   HGB 11.1* 09/14/2015 1836   HGB 11.3* 02/03/2014 1706   HCT 33.8* 09/14/2015 1836   HCT 35.3 02/03/2014 1706   PLT 277 09/14/2015 1836   PLT 212  02/03/2014 1706   MCV 80.3 09/14/2015 1836   MCV 92 02/03/2014 1706   NEUTROABS 10.4* 09/14/2015 1836   NEUTROABS 2.8 08/30/2012 0417   LYMPHSABS 0.7* 09/14/2015 1836   LYMPHSABS 1.5 08/30/2012 0417   MONOABS 0.3 09/14/2015 1836   MONOABS 0.5 08/30/2012 0417   EOSABS 0.1 09/14/2015 1836   EOSABS 0.3 08/30/2012 0417   BASOSABS 0.0 09/14/2015 1836   BASOSABS 0.0 08/30/2012 0417    BNP (last 3 results)  Recent Labs  04/05/15 1734  BNP 159.0*    ProBNP (last 3 results)  Recent Labs  09/22/14 1357  PROBNP 54.0     Result status: Final result                             [facility]*                  [address]                        [place], [place] [address]                            [phone]   ------------------------------------------------------------------- Transthoracic Echocardiography   Patient:    Caeleigh, [place] MR #:       [id number] Study Date: 06/30/2015 Gender:     F Age:        89 Height:     152.4 cm Weight:     62.4 kg BSA:        1.64 m^2 Pt. Status: Room:    ATTENDING    Default, Provider [address]     Hannah Beat [id number]  VHQIONGEX    Hannah Beat [id number]  PERFORMING   [facility] [place]  SONOGRAPHER  Quentin Ore, RVT, RDCS, RDMS   cc:   ------------------------------------------------------------------- LV EF: 60% -   65%   ------------------------------------------------------------------- History:   PMH:   Dyspnea.  Atrial fibrillation.  Chronic obstructive pulmonary disease.  Risk factors:  Former tobacco use. Hypertension. Diabetes mellitus. Dyslipidemia.   ------------------------------------------------------------------- Study Conclusions   - Left ventricle: The cavity size was normal. There was mild   concentric hypertrophy. Systolic function was normal. The   estimated ejection fraction was in the range of 60% to 65%. Wall   motion was normal; there were no regional wall motion    abnormalities. Doppler parameters are consistent with abnormal   left ventricular relaxation (grade 1 diastolic dysfunction). - Mitral valve: There was mild regurgitation. - Left atrium: The atrium was mildly dilated. - Right ventricle: Systolic function was normal. - Pulmonary arteries: Systolic pressure was within the normal   range.   Impressions:   - Rhythm is normal sinus.   Transthoracic echocardiography.  M-mode, complete 2D, spectral Doppler, and color Doppler.  Birthdate:  Patient birthdate: 11-12-1931.  Age:  Patient is 82 yr old.  Sex:  Gender: female. BMI: 26.9 kg/m^2.  Blood pressure:     148/78  Patient status: Outpatient.  Study date:  Study date: 06/30/2015. Study time: 01:11 PM.   -------------------------------------------------------------------   -------------------------------------------------------------------  Left ventricle:  The cavity size was normal. There was mild concentric hypertrophy. Systolic function was normal. The estimated ejection fraction was in the range of 60% to 65%. Wall motion was normal; there were no regional wall motion abnormalities. Doppler parameters are consistent with abnormal left ventricular relaxation (grade 1 diastolic dysfunction).   ------------------------------------------------------------------- Aortic valve:   Trileaflet; normal thickness, mildly calcified leaflets. Mobility was not restricted.  Doppler:  Transvalvular velocity was within the normal range. There was no stenosis. There was no regurgitation.   ------------------------------------------------------------------- Aorta:  Aortic root: The aortic root was normal in size.   ------------------------------------------------------------------- Mitral valve:   Structurally normal valve.   Mobility was not restricted.  Doppler:  Transvalvular velocity was within the normal range. There was no evidence for stenosis. There was mild regurgitation.    Peak  gradient (D): 2 mm Hg.   ------------------------------------------------------------------- Left atrium:  The atrium was mildly dilated.   ------------------------------------------------------------------- Right ventricle:  The cavity size was normal. Wall thickness was normal. Systolic function was normal.   ------------------------------------------------------------------- Pulmonic valve:    Structurally normal valve.   Cusp separation was normal.  Doppler:  Transvalvular velocity was within the normal range. There was no evidence for stenosis. There was no regurgitation.   ------------------------------------------------------------------- Tricuspid valve:   Structurally normal valve.    Doppler: Transvalvular velocity was within the normal range. There was mild regurgitation.   ------------------------------------------------------------------- Pulmonary artery:   The main pulmonary artery was normal-sized. Systolic pressure was within the normal range.   ------------------------------------------------------------------- Right atrium:  The atrium was normal in size.   ------------------------------------------------------------------- Pericardium:  There was no pericardial effusion.   ------------------------------------------------------------------- Systemic veins: Inferior vena cava: The vessel was normal in size. The respirophasic diameter changes were in the normal range (>= 50%), consistent with normal central venous pressure.   ------------------------------------------------------------------- Measurements    Left ventricle                          Value        Reference  LV ID, ED, PLAX chordal         (L)     40.8  mm     43 - 52  LV ID, ES, PLAX chordal         (L)     20.9  mm     23 - 38  LV fx shortening, PLAX chordal          49    %      >=29  LV PW thickness, ED                     11.3  mm     ---------  IVS/LV PW ratio, ED                      1.19         <=1.3  Stroke volume, 2D                       70    ml     ---------  Stroke volume/bsa, 2D                   43    ml/m^2 ---------  LV e&', lateral  9.14  cm/s   ---------  LV E/e&', lateral                        8.34         ---------  LV e&', medial                           5.22  cm/s   ---------  LV E/e&', medial                         14.6         ---------  LV e&', average                          7.18  cm/s   ---------  LV E/e&', average                        10.61        ---------     Ventricular septum                      Value        Reference  IVS thickness, ED                       13.5  mm     ---------     LVOT                                    Value        Reference  LVOT ID, S                              18    mm     ---------  LVOT area                               2.54  cm^2   ---------  LVOT ID                                 18    mm     ---------  LVOT peak velocity, S                   117   cm/s   ---------  LVOT mean velocity, S                   73.6  cm/s   ---------  LVOT VTI, S                             27.7  cm     ---------  LVOT peak gradient, S                   5     mm Hg  ---------  Stroke volume (SV), LVOT DP             70.5  ml     ---------  Stroke index (SV/bsa), LVOT DP  42.9  ml/m^2 ---------     Aorta                                   Value        Reference  Aortic root ID, ED                      32    mm     ---------  Ascending aorta ID, A-P, S              32    mm     ---------     Left atrium                             Value        Reference  LA ID, A-P, ES                          48    mm     ---------  LA ID/bsa, A-P                  (H)     2.92  cm/m^2 <=2.2  LA volume, S                            79.5  ml     ---------  LA volume/bsa, S                        48.4  ml/m^2 ---------  LA volume, ES, 1-p A4C                  61.3  ml     ---------  LA volume/bsa, ES, 1-p  A4C              37.3  ml/m^2 ---------  LA volume, ES, 1-p A2C                  94.6  ml     ---------  LA volume/bsa, ES, 1-p A2C              57.6  ml/m^2 ---------     Mitral valve                            Value        Reference  Mitral E-wave peak velocity             76.2  cm/s   ---------  Mitral A-wave peak velocity             90.2  cm/s   ---------  Mitral deceleration time        (H)     278   ms     150 - 230  Mitral peak gradient, D                 2     mm Hg  ---------  Mitral E/A ratio, peak                  0.8          ---------     Right ventricle  Value        Reference  TAPSE                                   10    mm     ---------     Pulmonic valve                          Value        Reference  Pulmonic valve peak velocity, S         49    cm/s   ---------   Legend: (L)  and  (H)  mark values outside specified reference range.   ------------------------------------------------------------------- Prepared and Electronically Authenticated by   Dossie Arbour, MD, [facility] 2017-03-30T17:40:39  Dg Chest 2 View  09/14/2015  CLINICAL DATA:  Acute onset of shortness of breath. Initial encounter. EXAM: CHEST  2 VIEW COMPARISON:  Chest radiograph from 04/05/2015 FINDINGS: There is elevation of the right hemidiaphragm. No pleural effusion or pneumothorax is seen. The cardiomediastinal silhouette is borderline normal in size. The patient is status post median sternotomy, with evidence of prior CABG. Bilateral shoulder arthroplasties appear grossly intact, with chronic superior subluxation of both arthroplasties. Thoracic spinal stimulation leads are noted. IMPRESSION: Elevation of the right hemidiaphragm.  Lungs remain grossly clear. Electronically Signed   By: Roanna Raider M.D.   On: 09/14/2015 18:30     Assessment and Plan:   Acute exacerbation of chronic obstructive pulmonary disease (COPD) (HCC) - Plan: Ambulatory referral to Pulmonology,  methylPREDNISolone acetate (DEPO-MEDROL) injection 80 mg  Chronic respiratory failure with hypoxia (HCC) - Plan: Ambulatory referral to Pulmonology, methylPREDNISolone acetate (DEPO-MEDROL) injection 80 mg  Pedal edema  Non compliance with medical treatment  Level of Medical Decision-Making in this case is HIGH  >40 minutes spent in face to face time with patient, >50% spent in counselling or coordination of care   Acute COPD exacerbation. Suspect that noncompliance playing a significant role, history back up by family. Acutely, give the patient 80 milligrams of Depo-Medrol IM in the office. Then give her a longer taper of steroids. She was only given 4 days of prednisone from the ER.  Given risk in this situation, I think that antibiotics are appropriate.  Refilled both her Advair and Spiriva.  I am less concerned regarding her edema. We just checked a echocardiogram 2 and half months ago. Reassuring. Liver and kidney function are also very reassuring. Suggested compression hose.  The patient has severe COPD and chronic hypoxemia and chronic respiratory failure. She previously saw Dr. Kendrick Fries in the pulmonary office. She has been resistant to going routinely to see pulmonary. At this point her disease has progressed, and I think that having some assistance from pulmonology would be very helpful. I greatly appreciate their assistance in this case.  Follow-up: Pulmonary ASAP and f/u with me within the next 1-2 mo  New Prescriptions   CEFDINIR (OMNICEF) 300 MG CAPSULE    Take 2 capsules (600 mg total) by mouth daily.   PREDNISONE (DELTASONE) 20 MG TABLET    2 tabs po for 7 days, then 1 tab po for 7 days   Modified Medications   Modified Medication Previous Medication   FLUTICASONE-SALMETEROL (ADVAIR DISKUS) 500-50 MCG/DOSE AEPB Fluticasone-Salmeterol (ADVAIR DISKUS) 500-50 MCG/DOSE AEPB      Inhale 1 puff into the lungs 2 (two) times daily.  Inhale 1 puff into the lungs 2 (two) times  daily.   TIOTROPIUM (SPIRIVA) 18 MCG INHALATION CAPSULE tiotropium (SPIRIVA) 18 MCG inhalation capsule      Place 1 capsule (18 mcg total) into inhaler and inhale daily.    Place 1 capsule (18 mcg total) into inhaler and inhale daily.   Orders Placed This Encounter  Procedures  . Ambulatory referral to Pulmonology    Signed,  Karleen Hampshire T. Mare Ludtke, MD   Patient's Medications  New Prescriptions   CEFDINIR (OMNICEF) 300 MG CAPSULE    Take 2 capsules (600 mg total) by mouth daily.   PREDNISONE (DELTASONE) 20 MG TABLET    2 tabs po for 7 days, then 1 tab po for 7 days  Previous Medications   ACIDOPHILUS (RISAQUAD) CAPS CAPSULE    Take 1 capsule by mouth daily.   ALBUTEROL (PROVENTIL HFA;VENTOLIN HFA) 108 (90 BASE) MCG/ACT INHALER    Inhale 2 puffs into the lungs every 6 (six) hours as needed for wheezing or shortness of breath.   ALBUTEROL (PROVENTIL) (2.5 MG/3ML) 0.083% NEBULIZER SOLUTION    Take 2.5 mg by nebulization every 4 (four) hours as needed for wheezing or shortness of breath.   AMLODIPINE (NORVASC) 5 MG TABLET    Take 1 tablet (5 mg total) by mouth daily.   ASPIRIN EC 81 MG TABLET    Take 1 tablet (81 mg total) by mouth daily.   ATORVASTATIN (LIPITOR) 40 MG TABLET    Take 1 tablet (40 mg total) by mouth daily.   DOCUSATE SODIUM (COLACE) 100 MG CAPSULE    Take 100 mg by mouth 2 (two) times daily as needed for mild constipation.   IPRATROPIUM (ATROVENT) 0.02 % NEBULIZER SOLUTION    Take 2.5 mLs (0.5 mg total) by nebulization every 6 (six) hours as needed for wheezing or shortness of breath.   MELATONIN 5 MG TABS    Take 5 mg by mouth at bedtime as needed (for sleep).    METOPROLOL (LOPRESSOR) 50 MG TABLET    Take 1 tablet (50 mg total) by mouth 2 (two) times daily.   MORPHINE (MS CONTIN) 15 MG 12 HR TABLET    Take 15 mg by mouth every 12 (twelve) hours.   MORPHINE (MS CONTIN) 30 MG 12 HR TABLET    Take 30 mg by mouth every 12 (twelve) hours.   NITROGLYCERIN (NITROSTAT) 0.4 MG SL  TABLET    Place 0.4 mg under the tongue every 5 (five) minutes as needed for chest pain.   OMEGA-3 FATTY ACIDS (FISH OIL) 1000 MG CAPS    Take 1,000 mg by mouth daily.    OXYCODONE (OXYCONTIN) 30 MG 12 HR TABLET    Take 30 mg by mouth at bedtime.    RAMIPRIL (ALTACE) 5 MG CAPSULE    Take 2 capsules (10 mg total) by mouth at bedtime.   TIZANIDINE (ZANAFLEX) 4 MG TABLET    Take 2-4 mg by mouth at bedtime as needed for muscle spasms.   VITAMIN E PO    Take 1 capsule by mouth daily.   Modified Medications   Modified Medication Previous Medication   FLUTICASONE-SALMETEROL (ADVAIR DISKUS) 500-50 MCG/DOSE AEPB Fluticasone-Salmeterol (ADVAIR DISKUS) 500-50 MCG/DOSE AEPB      Inhale 1 puff into the lungs 2 (two) times daily.    Inhale 1 puff into the lungs 2 (two) times daily.   TIOTROPIUM (SPIRIVA) 18 MCG INHALATION CAPSULE tiotropium (SPIRIVA) 18 MCG inhalation capsule  Place 1 capsule (18 mcg total) into inhaler and inhale daily.    Place 1 capsule (18 mcg total) into inhaler and inhale daily.  Discontinued Medications   LEVOFLOXACIN (LEVAQUIN) 750 MG TABLET    Take 1 tablet (750 mg total) by mouth daily.   PREDNISONE (DELTASONE) 20 MG TABLET    Take 2 tablets (40 mg total) by mouth daily.

## 2015-09-20 NOTE — Progress Notes (Signed)
Pre visit review using our clinic review tool, if applicable. No additional management support is needed unless otherwise documented below in the visit note. 

## 2015-09-20 NOTE — Patient Instructions (Signed)

## 2015-09-27 ENCOUNTER — Ambulatory Visit (INDEPENDENT_AMBULATORY_CARE_PROVIDER_SITE_OTHER): Payer: Medicare Other | Admitting: Internal Medicine

## 2015-09-27 ENCOUNTER — Encounter: Payer: Self-pay | Admitting: Internal Medicine

## 2015-09-27 VITALS — BP 140/68 | HR 72 | Ht 60.0 in | Wt 136.0 lb

## 2015-09-27 DIAGNOSIS — J986 Disorders of diaphragm: Secondary | ICD-10-CM | POA: Insufficient documentation

## 2015-09-27 DIAGNOSIS — J439 Emphysema, unspecified: Secondary | ICD-10-CM | POA: Diagnosis not present

## 2015-09-27 DIAGNOSIS — Z716 Tobacco abuse counseling: Secondary | ICD-10-CM

## 2015-09-27 DIAGNOSIS — J9611 Chronic respiratory failure with hypoxia: Secondary | ICD-10-CM

## 2015-09-27 NOTE — Assessment & Plan Note (Signed)
Vision is on 4.5 L of supplemental oxygen at night based on an overnight oximetry study performed by PMD We'll obtain records of overnight study  Does not qualify for oxygen with exertion based on office walk test.  Plan: -Continue with supplemental oxygen at night, 4.5 L

## 2015-09-27 NOTE — Assessment & Plan Note (Addendum)
Chronic right hemi-diaphragm elevation based on my review of images dating back to 2015. No further workup needed at this time

## 2015-09-27 NOTE — Patient Instructions (Addendum)
Follow up with Dr. Dema Severin in:2 months - you do not qualify for O2 at this time, given the results of the walk test in the office you had today - cont with Advair and Spiriva - stop smoking all together - cont with O2 at night - spirometry in office, prior to follow up visit.  - exercise daily - walking 10-15 minutes, minimum, daily, as tolerated.

## 2015-09-27 NOTE — Progress Notes (Signed)
[facility] Consultation    Date: 09/27/2015  MRN# [id number] [name] February 12, 1932  Referring Physician: Dr. Dallas Schimke PMD - Dr. Delfina Redwood is a 82 y.o. old female seen in consultation for copd optimization and O2 requirements  CC:  Chief Complaint  Patient presents with  . pulmonary consult    seen BQ in 2014. ref by Dr. Patsy Lager. dx w/copd around 2007. c/o sob w/exertion, prod cough clear in color, occ cp w/coughing spell X75mo. on 4.5L 02.     HPI:  [name] is a pleasant 82 year old female who has a past medical history significant for COPD and coronary artery disease who comes to our clinic today for evaluation of COPD. She has smoked up to one half pack of cigarettes daily for 60 years. She has quit as many as 3 times and was smoking 3-4 cigarettes a day until 3 Michl ago when she had a COPD exacerbation - since then she has been on omnicef and prednisone taper. She previously saw Dr. Kendrick Fries in 04/2012 who recommend tobacco cessation, pulm rehab, and spiriva initiation Since then she was continue to smoke, as stated above, and is now on Advair and Spiriva. She is  Also, she has developed persistent right hemi diaphragm elevation since at since 05/2013, by my review of images. In October of 2013 she underwent a coronary artery bypass graft by Dr. [name] at [facility]. In November of 2013 she was admitted to the hospital for several days with acute respiratory failure requiring BiPAP. Cardiology felt that she had a COPD exacerbation and she was discharged home after several days in the hospital. She was also noted to have bilateral pleural effusions which gradually improved radiographically several [place] after her surgery. She tells me today that she has very minimal shortness of breath on exertion and she's not quite sure why she's here. To be honest, I think she is not fully revealing the severity of her symptoms. She would like to get  back to exercising and line dancing on a regular basis. She is currently not using oxygen and she stopped taking her Spriva. She is to start cardio rehabilitation tomorrow. She denies cough. She states that she can walk one quarter of a mile daily while walking her dog without stopping. She believes that any lung disease she may have is do to a chemical exposure that occurred in 2009. Apparently she accidentally mixed cleaning solutions and developed acute respiratory failure requiring hospitalization immediately thereafter. She was and with inhalers and oxygen for several months after this. Today, she states that she has shortness of breath which is gradually been getting better since she stopped smoking. She is requesting evaluation for portable oxygen concentrator with exertion. She's had ONO, and hasn't placed on 4.5 L at night by her primary care physician. Today she is accompanied by her daughter-in-law. She is going to be spending one to 2 months in [place] with her daughter while she is recovering from her recent COPD exacerbation. Today patient states that since she's been on prednisone and Omnicef, her breathing is significantly improved, she does not have a significant cough any longer, she still has some fatigue, and feels like she needs oxygen with exertion. Today we walked her in the office for 3 laps and she had a desaturation briefly to 88%, but did not drop below that, and does not qualify for supplemental oxygen with exertion at this time.  PMHX:   Past Medical History  Diagnosis Date  . Hypertension 08/01/1995  . Rheumatoid arthritis(714.0) 04/03/1983  . Osteoarthritis 04/03/1983  . Anemia, deficiency 07/01/2004    (w/u by Dr. Lorre Nick)  . Diabetes mellitus type II 07/31/1997  . Osteoporosis 12/31/2000  . CAD (coronary artery disease) 09/01/2002    a. s/p NSTEMI 2012. b. s/p CABG 01/2012.  [name] Hyperlipemia 07/02/1999  . COPD (chronic obstructive pulmonary disease) (HCC)  05/31/2004  . diverticulosis of colon   . Arteriovenous malformation of gastrointestinal tract   . Fatty liver   . Chronic back pain   . Splenic artery aneurysm (HCC)   . Anxiety and depression   . GI bleed     a. 04/2011 felt diverticular requiring microembolization by vascular.  . Adrenal mass (HCC)     a. Excision 2007.  [name] Restless leg syndrome   . C. difficile colitis     a. 04/2008 = severe.  [name] Hx of CABG 04/18/2012  . PAF (paroxysmal atrial fibrillation) (HCC) 12/16/2013  . 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 Chronic back pain greater than 3 months duration 11/30/2014  . CHF (congestive heart failure) (HCC)    Surgical Hx:  Past Surgical History  Procedure Laterality Date  . Nasal sinus surgery    . Inguinal hernia repair    . Partial hysterectomy      ovaries intact, dysmennorhea w/ A/P repari  . Carpal tunnel release      bilat  . Trigger finger release      right  . Wrist surgery      neuroma repair, right  . Ankle surgery      neuroma repair, right ORIF  . Total shoulder replacement  12/13/1998    right, ([place])  . Rotator cuff repair  11/22/2003    ([place]) left  . Epidural block injection  05/2005    x 3  . Long finger tendon realignment  10/31/2005    [place]  . Lumbar epidural  06/13/2006  . Laminectomy  06/2006    L4/5 spinal stenosis (Dr. [name])  . Laminectomy  02/24/2008    L3/4 Ant/Lat interbody fusion and Lat Artthrodesis w/plate using XLIF (Dr. [name])  . Colonic embolization  04/18/2011    Dr. Wyn Quaker, acute GI bleed  . Spinal cord stimulator implant    . Coronary artery bypass graft  01/22/2012    Procedure: CORONARY ARTERY BYPASS GRAFTING (CABG);  Surgeon: Delight Ovens, MD;  Location: [facility] OR;  Service: Open Heart Surgery;  Laterality: N/A;  Coronary Artery Bypass Graft times four utilizing the left internal mammary artery and the left greater saphenous vein harvested endoscopically.  Rhae Hammock without cardioversion   01/22/2012    Procedure: TRANSESOPHAGEAL ECHOCARDIOGRAM (TEE);  Surgeon: Delight Ovens, MD;  Location: [facility] OR;  Service: Open Heart Surgery;  Laterality: N/A;   Family Hx:  Family History  Problem Relation Age of Onset  . Hypotension Mother   . Dementia Mother   . Alcohol abuse Father   . Heart disease Father     MI, enlarged heart  . Diabetes Brother    Social Hx:   Social History  Substance Use Topics  . Smoking status: Former Smoker -- 1.00 packs/day for 50 years    Types: Cigarettes    Quit date: 12/18/2014  . Smokeless tobacco: Former Neurosurgeon  . Alcohol Use: 0.0 oz/week    0 Standard drinks or equivalent per week     Comment: red wine occassionally   Medication:  Current Outpatient Rx  Name  Route  Sig  Dispense  Refill  . acidophilus (RISAQUAD) CAPS capsule   Oral   Take 1 capsule by mouth daily.         [name] albuterol (PROVENTIL HFA;VENTOLIN HFA) 108 (90 Base) MCG/ACT inhaler   Inhalation   Inhale 2 puffs into the lungs every 6 (six) hours as needed for wheezing or shortness of breath.         [name] albuterol (PROVENTIL) (2.5 MG/3ML) 0.083% nebulizer solution   Nebulization   Take 2.5 mg by nebulization every 4 (four) hours as needed for wheezing or shortness of breath.         [name] amLODipine (NORVASC) 5 MG tablet   Oral   Take 1 tablet (5 mg total) by mouth daily.   30 tablet   5   . aspirin EC 81 MG tablet   Oral   Take 1 tablet (81 mg total) by mouth daily.   90 tablet   3   . atorvastatin (LIPITOR) 40 MG tablet   Oral   Take 1 tablet (40 mg total) by mouth daily.   30 tablet   5   . cefdinir (OMNICEF) 300 MG capsule   Oral   Take 2 capsules (600 mg total) by mouth daily.   20 capsule   0   . docusate sodium (COLACE) 100 MG capsule   Oral   Take 100 mg by mouth 2 (two) times daily as needed for mild constipation.         . Fluticasone-Salmeterol (ADVAIR DISKUS) 500-50 MCG/DOSE AEPB   Inhalation   Inhale 1 puff into the lungs 2 (two) times  daily.   60 each   11   . ipratropium (ATROVENT) 0.02 % nebulizer solution   Nebulization   Take 2.5 mLs (0.5 mg total) by nebulization every 6 (six) hours as needed for wheezing or shortness of breath.   75 mL   2     PLEASE FILE UNDER MEDICARE PART B-NEBULIZER SOLUTI ...   . Melatonin 5 MG TABS   Oral   Take 5 mg by mouth at bedtime as needed (for sleep).          . metoprolol (LOPRESSOR) 50 MG tablet   Oral   Take 1 tablet (50 mg total) by mouth 2 (two) times daily.   60 tablet   5   . morphine (MS CONTIN) 30 MG 12 hr tablet   Oral   Take 30 mg by mouth every 12 (twelve) hours.         . nitroGLYCERIN (NITROSTAT) 0.4 MG SL tablet   Sublingual   Place 0.4 mg under the tongue every 5 (five) minutes as needed for chest pain.         . Omega-3 Fatty Acids (FISH OIL) 1000 MG CAPS   Oral   Take 1,000 mg by mouth daily.          [name] oxyCODONE (OXYCONTIN) 30 MG 12 hr tablet   Oral   Take 30 mg by mouth at bedtime.          . predniSONE (DELTASONE) 20 MG tablet      2 tabs po for 7 days, then 1 tab po for 7 days   21 tablet   0   . ramipril (ALTACE) 5 MG capsule   Oral   Take 2 capsules (10 mg total) by mouth at bedtime.   60 capsule   5   .  tiotropium (SPIRIVA) 18 MCG inhalation capsule   Inhalation   Place 1 capsule (18 mcg total) into inhaler and inhale daily.   30 capsule   12   . tiZANidine (ZANAFLEX) 4 MG tablet   Oral   Take 2-4 mg by mouth at bedtime as needed for muscle spasms.         [name] VITAMIN E PO   Oral   Take 1 capsule by mouth daily.              Allergies:  Other; Penicillins; Sulfa antibiotics; Morphine sulfate; Macrolides and ketolides; Iron; and Naproxen  Review of Systems  Constitutional: Positive for malaise/fatigue. Negative for fever and chills.  HENT: Negative for hearing loss.   Eyes: Negative for blurred vision.  Respiratory: Positive for cough, shortness of breath and wheezing. Negative for sputum production.     Cardiovascular: Negative for chest pain.  Gastrointestinal: Negative for heartburn, nausea and vomiting.  Genitourinary: Negative for dysuria.  Musculoskeletal: Negative for myalgias.       Chronic left lower extremity 1+ edema  Skin: Negative for rash.  Neurological: Negative for dizziness and headaches.  Endo/Heme/Allergies: Does not bruise/bleed easily.  Psychiatric/Behavioral: Negative for depression.     Physical Examination:   VS: BP 140/68 mmHg  Pulse 72  Ht 5' (1.524 m)  Wt 136 lb (61.689 kg)  BMI 26.56 kg/m2  SpO2 92%  General Appearance: No distress  Neuro:without focal findings, mental status, speech normal, alert and oriented, cranial nerves 2-12 intact, reflexes normal and symmetric, sensation grossly normal  HEENT: PERRLA, EOM intact, no ptosis, no other lesions noticed; Mallampati 2 Pulmonary: normal breath sounds., Decreased breath sounds on the right side.No wheezing, No rales;   Sputum Production:  None CardiovascularNormal S1,S2.  No m/r/g.  Abdominal aorta pulsation normal.    Abdomen: Benign, Soft, non-tender, No masses, hepatosplenomegaly, No lymphadenopathy Renal:  No costovertebral tenderness  GU:  No performed at this time. Endoc: No evident thyromegaly, no signs of acromegaly or Cushing features Skin:   warm, no rashes, no ecchymosis  Extremities: normal, no cyanosis, clubbing, no edema, warm with normal capillary refill. Other findings: 1+ pitting edema left lower extremity   Labs results:   Rad results: (The following images and results were reviewed by Dr. Dema Severin on 09/27/2015). CXR 09/14/15 EXAM: CHEST 2 VIEW  COMPARISON: Chest radiograph from 04/05/2015  FINDINGS: There is elevation of the right hemidiaphragm. No pleural effusion or pneumothorax is seen.  The cardiomediastinal silhouette is borderline normal in size. The patient is status post median sternotomy, with evidence of prior CABG. Bilateral shoulder arthroplasties appear  grossly intact, with chronic superior subluxation of both arthroplasties. Thoracic spinal stimulation leads are noted.  IMPRESSION: Elevation of the right hemidiaphragm. Lungs remain grossly clear.   ECHO 06/2015 Study Conclusions  - Left ventricle: The cavity size was normal. There was mild  concentric hypertrophy. Systolic function was normal. The  estimated ejection fraction was in the range of 60% to 65%. Wall  motion was normal; there were no regional wall motion  abnormalities. Doppler parameters are consistent with abnormal  left ventricular relaxation (grade 1 diastolic dysfunction). - Mitral valve: There was mild regurgitation. - Left atrium: The atrium was mildly dilated. - Right ventricle: Systolic function was normal. - Pulmonary arteries: Systolic pressure was within the normal  range.  Impressions:  - Rhythm is normal sinus.  Assessment and Plan: 82 year old female with past medical history of COPD, tobacco abuse, chronic hypoxic respiratory failure, seen  in consultation for optimization of COPD and O2 requirements COPD with emphysema Patient with known emphysema. Advised to continue current inhalers Tobacco avoidance She's had significant improvement since stopped smoking over the past 3 [place]. Today she does not qualify for supplemental oxygen with exertion given that she did not desaturate below 88% with 3 laps around our office.  Plan: -In office spirometry prior to follow-up visit -Continue with current inhalers: Spiriva and Advair -Continue supplemental oxygen at night. Lying-increase exercise as tolerated -Patient will have repeat benefit from pulmonary rehabilitation, however she is going to [place] for about 1-2 months, we will discuss again at follow-up visit  Chronic respiratory failure with hypoxia (HCC) Vision is on 4.5 L of supplemental oxygen at night based on an overnight oximetry study performed by PMD We'll obtain records of  overnight study  Does not qualify for oxygen with exertion based on office walk test.  Plan: -Continue with supplemental oxygen at night, 4.5 L  Right Elevated hemidiaphragm-Chronic Chronic right hemi-diaphragm elevation based on my review of images dating back to 2015. No further workup needed at this time     Updated Medication List Outpatient Encounter Prescriptions as of 09/27/2015  Medication Sig  . acidophilus (RISAQUAD) CAPS capsule Take 1 capsule by mouth daily.  [name] albuterol (PROVENTIL HFA;VENTOLIN HFA) 108 (90 Base) MCG/ACT inhaler Inhale 2 puffs into the lungs every 6 (six) hours as needed for wheezing or shortness of breath.  [name] albuterol (PROVENTIL) (2.5 MG/3ML) 0.083% nebulizer solution Take 2.5 mg by nebulization every 4 (four) hours as needed for wheezing or shortness of breath.  [name] amLODipine (NORVASC) 5 MG tablet Take 1 tablet (5 mg total) by mouth daily.  [name] aspirin EC 81 MG tablet Take 1 tablet (81 mg total) by mouth daily.  [name] atorvastatin (LIPITOR) 40 MG tablet Take 1 tablet (40 mg total) by mouth daily.  . cefdinir (OMNICEF) 300 MG capsule Take 2 capsules (600 mg total) by mouth daily.  [name] docusate sodium (COLACE) 100 MG capsule Take 100 mg by mouth 2 (two) times daily as needed for mild constipation.  . Fluticasone-Salmeterol (ADVAIR DISKUS) 500-50 MCG/DOSE AEPB Inhale 1 puff into the lungs 2 (two) times daily.  [name] ipratropium (ATROVENT) 0.02 % nebulizer solution Take 2.5 mLs (0.5 mg total) by nebulization every 6 (six) hours as needed for wheezing or shortness of breath.  . Melatonin 5 MG TABS Take 5 mg by mouth at bedtime as needed (for sleep).   . metoprolol (LOPRESSOR) 50 MG tablet Take 1 tablet (50 mg total) by mouth 2 (two) times daily.  [name] morphine (MS CONTIN) 30 MG 12 hr tablet Take 30 mg by mouth every 12 (twelve) hours.  . nitroGLYCERIN (NITROSTAT) 0.4 MG SL tablet Place 0.4 mg under the tongue every 5 (five) minutes as needed for chest pain.  . Omega-3 Fatty  Acids (FISH OIL) 1000 MG CAPS Take 1,000 mg by mouth daily.   [name] oxyCODONE (OXYCONTIN) 30 MG 12 hr tablet Take 30 mg by mouth at bedtime.   . predniSONE (DELTASONE) 20 MG tablet 2 tabs po for 7 days, then 1 tab po for 7 days  . ramipril (ALTACE) 5 MG capsule Take 2 capsules (10 mg total) by mouth at bedtime.  [name] tiotropium (SPIRIVA) 18 MCG inhalation capsule Place 1 capsule (18 mcg total) into inhaler and inhale daily.  [name] tiZANidine (ZANAFLEX) 4 MG tablet Take 2-4 mg by mouth at bedtime as needed for muscle spasms.  [name] VITAMIN E PO  Take 1 capsule by mouth daily.   . [DISCONTINUED] morphine (MS CONTIN) 15 MG 12 hr tablet Take 15 mg by mouth every 12 (twelve) hours. Reported on 09/27/2015   No facility-administered encounter medications on file as of 09/27/2015.    Orders for this visit: No orders of the defined types were placed in this encounter.     Thank  you for the consultation and for allowing [facility] to assist in the care of your patient. Our recommendations are noted above.  Please contact us if we can be of further service.   Stephanie Acre, MD [facility] Office Number: [phone]  Note: This note was prepared with Dragon dictation along with smaller phrase technology. Any transcriptional errors that result from this process are unintentional.

## 2015-09-27 NOTE — Assessment & Plan Note (Addendum)
Patient with known emphysema. Advised to continue current inhalers Tobacco avoidance She's had significant improvement since stopped smoking over the past 3 [place]. Today she does not qualify for supplemental oxygen with exertion given that she did not desaturate below 88% with 3 laps around our office.  Plan: -In office spirometry prior to follow-up visit -Continue with current inhalers: Spiriva and Advair -Continue supplemental oxygen at night. Lying-increase exercise as tolerated -Patient will have repeat benefit from pulmonary rehabilitation, however she is going to [place] for about 1-2 months, we will discuss again at follow-up visit

## 2015-10-14 ENCOUNTER — Emergency Department: Payer: Medicare Other

## 2015-10-14 ENCOUNTER — Encounter: Payer: Self-pay | Admitting: Emergency Medicine

## 2015-10-14 ENCOUNTER — Emergency Department
Admission: EM | Admit: 2015-10-14 | Discharge: 2015-10-14 | Disposition: A | Discharge status: Home / Self Care | Payer: Medicare Other | Attending: Emergency Medicine | Admitting: Emergency Medicine

## 2015-10-14 DIAGNOSIS — Z87891 Personal history of nicotine dependence: Secondary | ICD-10-CM | POA: Insufficient documentation

## 2015-10-14 DIAGNOSIS — I480 Paroxysmal atrial fibrillation: Secondary | ICD-10-CM | POA: Insufficient documentation

## 2015-10-14 DIAGNOSIS — Z951 Presence of aortocoronary bypass graft: Secondary | ICD-10-CM | POA: Insufficient documentation

## 2015-10-14 DIAGNOSIS — F329 Major depressive disorder, single episode, unspecified: Secondary | ICD-10-CM | POA: Diagnosis not present

## 2015-10-14 DIAGNOSIS — I5033 Acute on chronic diastolic (congestive) heart failure: Secondary | ICD-10-CM | POA: Diagnosis not present

## 2015-10-14 DIAGNOSIS — Z7982 Long term (current) use of aspirin: Secondary | ICD-10-CM | POA: Diagnosis not present

## 2015-10-14 DIAGNOSIS — I2510 Atherosclerotic heart disease of native coronary artery without angina pectoris: Secondary | ICD-10-CM | POA: Diagnosis not present

## 2015-10-14 DIAGNOSIS — M810 Age-related osteoporosis without current pathological fracture: Secondary | ICD-10-CM | POA: Insufficient documentation

## 2015-10-14 DIAGNOSIS — E785 Hyperlipidemia, unspecified: Secondary | ICD-10-CM | POA: Insufficient documentation

## 2015-10-14 DIAGNOSIS — E119 Type 2 diabetes mellitus without complications: Secondary | ICD-10-CM | POA: Insufficient documentation

## 2015-10-14 DIAGNOSIS — M069 Rheumatoid arthritis, unspecified: Secondary | ICD-10-CM | POA: Diagnosis not present

## 2015-10-14 DIAGNOSIS — I110 Hypertensive heart disease with heart failure: Secondary | ICD-10-CM | POA: Diagnosis not present

## 2015-10-14 DIAGNOSIS — I252 Old myocardial infarction: Secondary | ICD-10-CM | POA: Insufficient documentation

## 2015-10-14 DIAGNOSIS — J441 Chronic obstructive pulmonary disease with (acute) exacerbation: Secondary | ICD-10-CM | POA: Diagnosis not present

## 2015-10-14 DIAGNOSIS — R0602 Shortness of breath: Secondary | ICD-10-CM | POA: Diagnosis present

## 2015-10-14 DIAGNOSIS — Z79899 Other long term (current) drug therapy: Secondary | ICD-10-CM | POA: Insufficient documentation

## 2015-10-14 DIAGNOSIS — Z96612 Presence of left artificial shoulder joint: Secondary | ICD-10-CM | POA: Diagnosis not present

## 2015-10-14 DIAGNOSIS — Z96611 Presence of right artificial shoulder joint: Secondary | ICD-10-CM | POA: Diagnosis not present

## 2015-10-14 LAB — CBC WITH DIFFERENTIAL/PLATELET
BASOS PCT: 0 %
Basophils Absolute: 0 10*3/uL (ref 0–0.1)
EOS ABS: 0.1 10*3/uL (ref 0–0.7)
Eosinophils Relative: 1 %
HEMATOCRIT: 30.9 % — AB (ref 35.0–47.0)
Hemoglobin: 10.3 g/dL — ABNORMAL LOW (ref 12.0–16.0)
LYMPHS ABS: 1.6 10*3/uL (ref 1.0–3.6)
Lymphocytes Relative: 16 %
MCH: 27.4 pg (ref 26.0–34.0)
MCHC: 33.4 g/dL (ref 32.0–36.0)
MCV: 82 fL (ref 80.0–100.0)
MONO ABS: 0.9 10*3/uL (ref 0.2–0.9)
MONOS PCT: 9 %
Neutro Abs: 7.5 10*3/uL — ABNORMAL HIGH (ref 1.4–6.5)
Neutrophils Relative %: 74 %
Platelets: 251 10*3/uL (ref 150–440)
RBC: 3.78 MIL/uL — ABNORMAL LOW (ref 3.80–5.20)
RDW: 16.7 % — AB (ref 11.5–14.5)
WBC: 10.2 10*3/uL (ref 3.6–11.0)

## 2015-10-14 LAB — BASIC METABOLIC PANEL
Anion gap: 5 (ref 5–15)
BUN: 23 mg/dL — AB (ref 6–20)
CALCIUM: 9.2 mg/dL (ref 8.9–10.3)
CHLORIDE: 97 mmol/L — AB (ref 101–111)
CO2: 33 mmol/L — AB (ref 22–32)
CREATININE: 0.72 mg/dL (ref 0.44–1.00)
GFR calc non Af Amer: >60 mL/min (ref >60)
Glucose, Bld: 121 mg/dL — ABNORMAL HIGH (ref 65–99)
Potassium: 4.4 mmol/L (ref 3.5–5.1)
SODIUM: 135 mmol/L (ref 135–145)

## 2015-10-14 LAB — BLOOD GAS, VENOUS
Acid-Base Excess: 11 mmol/L — ABNORMAL HIGH (ref 0.0–3.0)
BICARBONATE: 38.2 meq/L — AB (ref 21.0–28.0)
O2 SAT: 82.1 %
PATIENT TEMPERATURE: 37
PO2 VEN: 48 mmHg — AB (ref 31.0–45.0)
pCO2, Ven: 66 mmHg — ABNORMAL HIGH (ref 44.0–60.0)
pH, Ven: 7.37 (ref 7.320–7.430)

## 2015-10-14 LAB — BRAIN NATRIURETIC PEPTIDE: B Natriuretic Peptide: 397 pg/mL — ABNORMAL HIGH (ref 0.0–100.0)

## 2015-10-14 LAB — TROPONIN I

## 2015-10-14 MED ORDER — METHYLPREDNISOLONE SODIUM SUCC 125 MG IJ SOLR
125.0000 mg | Freq: Once | INTRAMUSCULAR | Status: AC
  Administered 2015-10-14: 125 mg via INTRAVENOUS

## 2015-10-14 MED ORDER — PREDNISONE 10 MG (21) PO TBPK: ORAL_TABLET | ORAL | Status: DC

## 2015-10-14 MED ORDER — IPRATROPIUM-ALBUTEROL 0.5-2.5 (3) MG/3ML IN SOLN
3.0000 mL | Freq: Once | RESPIRATORY_TRACT | Status: AC
  Administered 2015-10-14: 3 mL via RESPIRATORY_TRACT
  Filled 2015-10-14: qty 3

## 2015-10-14 MED ORDER — FUROSEMIDE 20 MG PO TABS: 20.0000 mg | ORAL_TABLET | Freq: Every day | ORAL | Status: DC

## 2015-10-14 MED ORDER — IPRATROPIUM-ALBUTEROL 0.5-2.5 (3) MG/3ML IN SOLN
RESPIRATORY_TRACT | Status: AC
Start: 2015-10-14 — End: 2015-10-14
  Administered 2015-10-14: 3 mL via RESPIRATORY_TRACT
  Filled 2015-10-14: qty 3

## 2015-10-14 MED ORDER — IPRATROPIUM-ALBUTEROL 0.5-2.5 (3) MG/3ML IN SOLN
3.0000 mL | Freq: Once | RESPIRATORY_TRACT | Status: AC
  Administered 2015-10-14: 3 mL via RESPIRATORY_TRACT

## 2015-10-14 MED ORDER — METHYLPREDNISOLONE SODIUM SUCC 125 MG IJ SOLR
INTRAMUSCULAR | Status: AC
  Administered 2015-10-14: 125 mg via INTRAVENOUS
  Filled 2015-10-14: qty 2

## 2015-10-14 MED ORDER — IPRATROPIUM-ALBUTEROL 0.5-2.5 (3) MG/3ML IN SOLN: 3.0000 mL | Freq: Four times a day (QID) | RESPIRATORY_TRACT | Status: DC | PRN

## 2015-10-14 MED ORDER — LEVOFLOXACIN 750 MG PO TABS: 750.0000 mg | ORAL_TABLET | Freq: Every day | ORAL | Status: DC

## 2015-10-14 NOTE — ED Provider Notes (Signed)
[facility] Emergency Department Provider Note        Time seen: ----------------------------------------- 10:24 AM on 10/14/2015 -----------------------------------------    I have reviewed the triage vital signs and the nursing notes.   HISTORY  Chief Complaint Shortness of Breath    HPI [name] is a 82 y.o. female who presents to ER for shortness of breath over the last month but has not gotten worse. She called EMS due to shortness of breath. Upon arrival she was diaphoretic. She has a history of COPD and wears oxygen at night. She had increased work of breathing on arrival. She denies any fevers or chills, states she just feels more short of breath than normal. She typically takes breathing treatments 4 times a day.   Past Medical History  Diagnosis Date  . Hypertension 08/01/1995  . Rheumatoid arthritis(714.0) 04/03/1983  . Osteoarthritis 04/03/1983  . Anemia, deficiency 07/01/2004    (w/u by Dr. Lorre Nick)  . Diabetes mellitus type II 07/31/1997  . Osteoporosis 12/31/2000  . CAD (coronary artery disease) 09/01/2002    a. s/p NSTEMI 2012. b. s/p CABG 01/2012.  [name] Hyperlipemia 07/02/1999  . COPD (chronic obstructive pulmonary disease) (HCC) 05/31/2004  . diverticulosis of colon   . Arteriovenous malformation of gastrointestinal tract   . Fatty liver   . Chronic back pain   . Splenic artery aneurysm (HCC)   . Anxiety and depression   . GI bleed     a. 04/2011 felt diverticular requiring microembolization by vascular.  . Adrenal mass (HCC)     a. Excision 2007.  [name] Restless leg syndrome   . C. difficile colitis     a. 04/2008 = severe.  [name] Hx of CABG 04/18/2012  . PAF (paroxysmal atrial fibrillation) (HCC) 12/16/2013  . 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 Chronic back pain greater than 3 months duration 11/30/2014  . CHF (congestive heart failure) [facility])     Patient Active Problem List   Diagnosis Date Noted   . Tobacco abuse counseling 09/27/2015  . 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 Chronic respiratory failure with hypoxia (HCC) 04/14/2015  . Failed back syndrome of lumbar spine 11/30/2014  . Pedal edema 08/31/2014  . 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 PAF (paroxysmal atrial fibrillation) (HCC) 12/16/2013  . History of GI diverticular bleed 09/25/2012  . Depression 06/17/2012  . Hx of CABG 04/18/2012  . Acute on chronic diastolic CHF (congestive heart failure) (HCC) 02/05/2012  . Splenic artery aneurysm (HCC)   . History of non-ST elevation myocardial infarction (NSTEMI) 07/22/2011  . GIB (gastrointestinal bleeding) 04/25/2011  . Smoking 02/09/2011  . AAA (abdominal aortic aneurysm) (HCC) 12/22/2010  . DIVERTICULOSIS OF COLON 05/14/2008  . ANXIETY DEPRESSION 04/19/2008  . ECZEMA 07/21/2007  . ARTERIOVENOUS MALFORMATION, GASTRIC 11/08/2006  . INSOMNIA, CHRONIC 07/21/2006  . ANEMIA-IRON DEFICIENCY 07/01/2004  . 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 CAD (coronary artery disease) 09/01/2002  . FATTY LIVER DISEASE 07/16/2002  . OSTEOPOROSIS 12/31/2000  . HYPERLIPIDEMIA 07/02/1999  . 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 Essential hypertension, benign 08/01/1995  . Rheumatoid arthritis(714.0) 04/03/1983  . OSTEOARTHRITIS 04/03/1983    Past Surgical History  Procedure Laterality Date  . Nasal sinus surgery    . Inguinal hernia repair    . Partial hysterectomy      ovaries intact, dysmennorhea w/ A/P repari  . Carpal tunnel release  bilat  . Trigger finger release      right  . Wrist surgery      neuroma repair, right  . Ankle surgery      neuroma repair, right ORIF  . Total shoulder replacement  12/13/1998    right, ([place])  . Rotator cuff repair  11/22/2003    ([place]) left  . Epidural block injection  05/2005    x 3  . Long finger tendon realignment  10/31/2005    [place]  .  Lumbar epidural  06/13/2006  . Laminectomy  06/2006    L4/5 spinal stenosis (Dr. [name])  . Laminectomy  02/24/2008    L3/4 Ant/Lat interbody fusion and Lat Artthrodesis w/plate using XLIF (Dr. [name])  . Colonic embolization  04/18/2011    Dr. Wyn Quaker, acute GI bleed  . Spinal cord stimulator implant    . Coronary artery bypass graft  01/22/2012    Procedure: CORONARY ARTERY BYPASS GRAFTING (CABG);  Surgeon: Delight Ovens, MD;  Location: [facility] OR;  Service: Open Heart Surgery;  Laterality: N/A;  Coronary Artery Bypass Graft times four utilizing the left internal mammary artery and the left greater saphenous vein harvested endoscopically.  Rhae Hammock without cardioversion  01/22/2012    Procedure: TRANSESOPHAGEAL ECHOCARDIOGRAM (TEE);  Surgeon: Delight Ovens, MD;  Location[facility];  Service: Open Heart Surgery;  Laterality: N/A;    Allergies Other; Penicillins; Sulfa antibiotics; Morphine sulfate; Macrolides and ketolides; Iron; and Naproxen  Social History Social History  Substance Use Topics  . Smoking status: Former Smoker -- 1.00 packs/day for 50 years    Types: Cigarettes    Quit date: 12/18/2014  . Smokeless tobacco: Former Neurosurgeon  . Alcohol Use: 0.0 oz/week    0 Standard drinks or equivalent per week     Comment: red wine occassionally    Review of Systems Constitutional: Negative for fever. Cardiovascular: Negative for chest pain. Respiratory: Positive shortness of breath Gastrointestinal: Negative for abdominal pain, vomiting and diarrhea. Genitourinary: Negative for dysuria. Musculoskeletal: Negative for back pain. Skin: Positive for sweating Neurological: Positive for weakness  10-point ROS otherwise negative.  ____________________________________________   PHYSICAL EXAM:  VITAL SIGNS: ED Triage Vitals  Enc Vitals Group     BP 10/14/15 1015 135/48 mmHg     Pulse Rate 10/14/15 1015 59     Resp 10/14/15 1015 28     Temp 10/14/15 1015 98.7 F (37.1 C)     Temp Source  10/14/15 1015 Oral     SpO2 10/14/15 1015 97 %     Weight 10/14/15 1015 147 lb (66.679 kg)     Height 10/14/15 1015 5' (1.524 m)     Head Cir --      Peak Flow --      Pain Score --      Pain Loc --      Pain Edu? --      Excl. in GC? --     Constitutional: Alert and oriented. Mild distress Eyes: Conjunctivae are normal. PERRL. Normal extraocular movements. ENT   Head: Normocephalic and atraumatic.   Nose: No congestion/rhinnorhea.   Mouth/Throat: Mucous membranes are moist.   Neck: No stridor. Cardiovascular: Normal rate, regular rhythm. No murmurs, rubs, or gallops. Respiratory: Prolonged expiration, wheezing bilaterally Gastrointestinal: Soft and nontender. Normal bowel sounds Musculoskeletal: Nontender with normal range of motion in all extremities. No lower extremity tenderness nor edema. Neurologic:  Normal speech and language. No gross focal neurologic deficits are appreciated.  Skin:  Skin is warm, dry and intact. No rash noted. Psychiatric: Mood and affect are normal. Speech and behavior are normal.  ____________________________________________  ED COURSE:  Pertinent labs & imaging results that were available during my care of the patient were reviewed by me and considered in my medical decision making (see chart for details). Patient presents to the ER with dyspnea, likely COPD exacerbation. We will check basic labs and imaging. ____________________________________________    LABS (pertinent positives/negatives)  Labs Reviewed  CBC WITH DIFFERENTIAL/PLATELET - Abnormal; Notable for the following:    RBC 3.78 (*)    Hemoglobin 10.3 (*)    HCT 30.9 (*)    RDW 16.7 (*)    Neutro Abs 7.5 (*)    All other components within normal limits  BASIC METABOLIC PANEL - Abnormal; Notable for the following:    Chloride 97 (*)    CO2 33 (*)    Glucose, Bld 121 (*)    BUN 23 (*)    All other components within normal limits  BRAIN NATRIURETIC PEPTIDE - Abnormal;  Notable for the following:    B Natriuretic Peptide 397.0 (*)    All other components within normal limits  BLOOD GAS, VENOUS - Abnormal; Notable for the following:    pCO2, Ven 66 (*)    pO2, Ven 48.0 (*)    Bicarbonate 38.2 (*)    Acid-Base Excess 11.0 (*)    All other components within normal limits  TROPONIN I    RADIOLOGY Images were viewed by me  Chest x-ray  IMPRESSION: Stable elevation of the right hemidiaphragm. Bibasilar atelectasis.  ____________________________________________  FINAL ASSESSMENT AND PLAN  COPD exacerbation  Plan: Patient with labs and imaging as dictated above. Patient is receiving duo nebs and steroids here with significant improvement in her symptoms. I have offered her hospitalization for COPD and she has declined. She does have oxygen at home, I'll prescribe DuoNeb solution for use at home as needed if her typical nebulous treatment is not helping. She will be on a steroid taper, antibiotics. I advise close follow-up with her doctor.   Emily Filbert, MD   Note: This dictation was prepared with Dragon dictation. Any transcriptional errors that result from this process are unintentional   Emily Filbert, MD 10/14/15 1230

## 2015-10-14 NOTE — Discharge Instructions (Signed)
Chronic Obstructive Pulmonary Disease Chronic obstructive pulmonary disease (COPD) is a common lung condition in which airflow from the lungs is limited. COPD is a general term that can be used to describe many different lung problems that limit airflow, including both chronic bronchitis and emphysema. If you have COPD, your lung function will probably never return to normal, but there are measures you can take to improve lung function and make yourself feel better. CAUSES   Smoking (common).  Exposure to secondhand smoke.  Genetic problems.  Chronic inflammatory lung diseases or recurrent infections. SYMPTOMS  Shortness of breath, especially with physical activity.  Deep, persistent (chronic) cough with a large amount of thick mucus.  Wheezing.  Rapid breaths (tachypnea).  Gray or bluish discoloration (cyanosis) of the skin, especially in your fingers, toes, or lips.  Fatigue.  Weight loss.  Frequent infections or episodes when breathing symptoms become much worse (exacerbations).  Chest tightness. DIAGNOSIS Your health care provider will take a medical history and perform a physical examination to diagnose COPD. Additional tests for COPD may include:  Lung (pulmonary) function tests.  Chest X-ray.  CT scan.  Blood tests. TREATMENT  Treatment for COPD may include:  Inhaler and nebulizer medicines. These help manage the symptoms of COPD and make your breathing more comfortable.  Supplemental oxygen. Supplemental oxygen is only helpful if you have a low oxygen level in your blood.  Exercise and physical activity. These are beneficial for nearly all people with COPD.  Lung surgery or transplant.  Nutrition therapy to gain weight, if you are underweight.  Pulmonary rehabilitation. This may involve working with a team of health care providers and specialists, such as respiratory, occupational, and physical therapists. HOME CARE INSTRUCTIONS  Take all medicines  (inhaled or pills) as directed by your health care provider.  Avoid over-the-counter medicines or cough syrups that dry up your airway (such as antihistamines) and slow down the elimination of secretions unless instructed otherwise by your health care provider.  If you are a smoker, the most important thing that you can do is stop smoking. Continuing to smoke will cause further lung damage and breathing trouble. Ask your health care provider for help with quitting smoking. He or she can direct you to community resources or hospitals that provide support.  Avoid exposure to irritants such as smoke, chemicals, and fumes that aggravate your breathing.  Use oxygen therapy and pulmonary rehabilitation if directed by your health care provider. If you require home oxygen therapy, ask your health care provider whether you should purchase a pulse oximeter to measure your oxygen level at home.  Avoid contact with individuals who have a contagious illness.  Avoid extreme temperature and humidity changes.  Eat healthy foods. Eating smaller, more frequent meals and resting before meals may help you maintain your strength.  Stay active, but balance activity with periods of rest. Exercise and physical activity will help you maintain your ability to do things you want to do.  Preventing infection and hospitalization is very important when you have COPD. Make sure to receive all the vaccines your health care provider recommends, especially the pneumococcal and influenza vaccines. Ask your health care provider whether you need a pneumonia vaccine.  Learn and use relaxation techniques to manage stress.  Learn and use controlled breathing techniques as directed by your health care provider. Controlled breathing techniques include:  Pursed lip breathing. Start by breathing in (inhaling) through your nose for 1 second. Then, purse your lips as if you were   going to whistle and breathe out (exhale) through the  pursed lips for 2 seconds.  Diaphragmatic breathing. Start by putting one hand on your abdomen just above your waist. Inhale slowly through your nose. The hand on your abdomen should move out. Then purse your lips and exhale slowly. You should be able to feel the hand on your abdomen moving in as you exhale.  Learn and use controlled coughing to clear mucus from your lungs. Controlled coughing is a series of short, progressive coughs. The steps of controlled coughing are: 1. Lean your head slightly forward. 2. Breathe in deeply using diaphragmatic breathing. 3. Try to hold your breath for 3 seconds. 4. Keep your mouth slightly open while coughing twice. 5. Spit any mucus out into a tissue. 6. Rest and repeat the steps once or twice as needed. SEEK MEDICAL CARE IF:  You are coughing up more mucus than usual.  There is a change in the color or thickness of your mucus.  Your breathing is more labored than usual.  Your breathing is faster than usual. SEEK IMMEDIATE MEDICAL CARE IF:  You have shortness of breath while you are resting.  You have shortness of breath that prevents you from:  Being able to talk.  Performing your usual physical activities.  You have chest pain lasting longer than 5 minutes.  Your skin color is more cyanotic than usual.  You measure low oxygen saturations for longer than 5 minutes with a pulse oximeter. MAKE SURE YOU:  Understand these instructions.  Will watch your condition.  Will get help right away if you are not doing well or get worse.   This information is not intended to replace advice given to you by your health care provider. Make sure you discuss any questions you have with your health care provider.   Document Released: 12/27/2004 Document Revised: 04/09/2014 Document Reviewed: 11/13/2012 Elsevier Interactive Patient Education 2016 [employer]  

## 2015-10-14 NOTE — ED Notes (Signed)
Has been shob last month but now gotten worse. Called EMS today. HX COPD, only wears O2 at night. Increased WOB on arrival

## 2015-10-14 NOTE — ED Notes (Signed)
Given snack ok per dr Mayford Knife

## 2015-10-20 ENCOUNTER — Telehealth: Payer: Self-pay
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
## 2015-10-20 NOTE — Telephone Encounter (Signed)
Known very well.   She has taken Xanax before for a few years. I think some low-dose prn xanax would be reasonable.   Alprazolam 0.25 mg, 1 po bid prn anxiety. #40, 0  Would be good for her to follow-up when she is back in town.

## 2015-10-21 MED ORDER — ALPRAZOLAM 0.25 MG PO TABS
0.2500 mg | ORAL_TABLET | Freq: Two times a day (BID) | ORAL | Status: DC | PRN
Start: 2015-10-21 — End: 2016-02-01

## 2015-10-21 NOTE — Telephone Encounter (Signed)
Pt daughter called to check on status of this request

## 2015-10-21 NOTE — Telephone Encounter (Signed)
Alprazolam called into [facility] in [place].  French Ana notified as instructed by telephone.

## 2015-11-06 ENCOUNTER — Other Ambulatory Visit: Payer: Self-pay | Admitting: Cardiology

## 2015-11-20 ENCOUNTER — Other Ambulatory Visit: Payer: Self-pay | Admitting: Cardiology

## 2015-11-25 ENCOUNTER — Ambulatory Visit: Payer: Medicare Other | Admitting: Internal Medicine

## 2015-11-25 ENCOUNTER — Encounter: Payer: Self-pay | Admitting: *Deleted

## 2015-12-02 ENCOUNTER — Encounter: Attending: Internal Medicine | Primary: Internal Medicine

## 2015-12-15 ENCOUNTER — Ambulatory Visit: Admit: 2015-12-15 | Discharge: 2015-12-15 | Payer: MEDICARE | Attending: Internal Medicine | Primary: Internal Medicine

## 2015-12-15 DIAGNOSIS — J449 Chronic obstructive pulmonary disease, unspecified: Secondary | ICD-10-CM

## 2015-12-15 MED ORDER — OTHER
0 refills | Status: DC
Start: 2015-12-15 — End: 2016-08-07

## 2015-12-15 NOTE — Progress Notes (Signed)
HISTORY OF PRESENT ILLNESS  Shelby Mora is a 82 y.o. female.    HTN-bp controlled at home. Not dizzy.  On low side here.       COPD-  3 mos ago moved from [place]. Moved here.  2 bouts of PNA recently.  Treated for PNA [place], PAF.  Went on diltiazem.  Did not put on blood thinner.  On asa only.  Dependent on oxygen.  Off antibitoics.  Home health coming out for PT.  Overall for pt her breathing is back at baseline.      Depression-  Sometimes feels down, misses her home.      Hypertension    The history is provided by the patient. This is a chronic problem. Associated symptoms include shortness of breath. Pertinent negatives include no chest pain, no blurred vision, no headaches, no dizziness and no nausea.       Review of Systems   Constitutional: Negative for fever and weight loss.   HENT: Negative for hearing loss.    Eyes: Negative for blurred vision.   Respiratory: Positive for shortness of breath.    Cardiovascular: Negative for chest pain and leg swelling.   Gastrointestinal: Negative for abdominal pain and nausea.   Genitourinary: Negative for dysuria.   Musculoskeletal: Negative for myalgias.   Skin: Negative for rash.   Neurological: Negative for dizziness and headaches.   Endo/Heme/Allergies: Does not bruise/bleed easily.   Psychiatric/Behavioral: Positive for depression.       Physical Exam   Constitutional: She is oriented to person, place, and time. She appears well-developed and well-nourished.   HENT:   Mouth/Throat: Oropharynx is clear and moist. No oropharyngeal exudate.   Eyes: Pupils are equal, round, and reactive to light. No scleral icterus.   Cardiovascular: Normal rate and regular rhythm.  Exam reveals no gallop and no friction rub.    No murmur heard.  Pulmonary/Chest: No respiratory distress. She has no wheezes. She has no rales.   Dec b/s throughout   Abdominal: Soft. She exhibits no distension. There is no tenderness. There is no rebound.    Musculoskeletal: She exhibits no edema.   Lymphadenopathy:     She has no cervical adenopathy.   Neurological: She is alert and oriented to person, place, and time. Gait normal.   Psychiatric: She has a normal mood and affect.       ICD-10-CM ICD-9-CM    1. Advanced COPD (HCC) J44.9 496    2. Atrial fibrillation with RVR (HCC) I48.91 427.31    3. Pneumonia of right lower lobe due to infectious organism (HCC) J18.1 486    4. Chronic respiratory failure with hypoxia (HCC) J96.11 518.83 CBC WITH AUTOMATED DIFF     799.02    5. Impaired fasting blood sugar R73.01 790.21 METABOLIC PANEL, COMPREHENSIVE      HEMOGLOBIN A1C WITH EAG   6. Depression, unspecified depression type F32.9 311    7. Essential hypertension I10 401.9      On O2.  Stable currently. Continue inhalers/nebs  PAF- in hospital only. In NSR.  If recurrent could consider NOAC.  PNA resolved.  Hx of elevated bs.  Check before next mo.  Also with low K+ at hospital  Pt to consider antidepressant-- likely situational but will likely not move back to [place]  bp on low side here, particurarly DBP.  Follow at home as nl readings there.

## 2015-12-15 NOTE — Patient Instructions (Signed)
Take meds as instructed.

## 2015-12-20 ENCOUNTER — Ambulatory Visit: Admit: 2015-12-20 | Discharge: 2015-12-20 | Payer: MEDICARE | Attending: Internal Medicine | Primary: Internal Medicine

## 2015-12-20 DIAGNOSIS — N390 Urinary tract infection, site not specified: Secondary | ICD-10-CM

## 2015-12-20 LAB — AMB POC URINALYSIS DIP STICK AUTO W/O MICRO
Bilirubin (UA POC): NEGATIVE
Glucose (UA POC): NEGATIVE
Ketones (UA POC): NEGATIVE
Nitrites (UA POC): NEGATIVE
Protein (UA POC): 100
Specific gravity (UA POC): 1.03 (ref 1.001–1.035)
Urobilinogen (UA POC): 0.2 (ref 0.2–1)
pH (UA POC): 6.5 (ref 4.6–8.0)

## 2015-12-20 MED ORDER — CIPROFLOXACIN 250 MG TAB
250 mg | ORAL_TABLET | Freq: Two times a day (BID) | ORAL | 0 refills | Status: AC
Start: 2015-12-20 — End: 2015-12-25

## 2015-12-20 NOTE — Patient Instructions (Addendum)
Take meds as instructed.        Vaccine Information Statement    Influenza (Flu) Vaccine (Inactivated or Recombinant): What you need to know    Many Vaccine Information Statements are available in Spanish and other languages. See www.immunize.org/vis  Hojas de Informaci??n Sobre Vacunas est??n disponibles en Espa??ol y en muchos otros idiomas. Visite www.immunize.org/vis    1. Why get vaccinated?    Influenza (???flu???) is a contagious disease that spreads around the [place] every year, usually between October and May.     Flu is caused by influenza viruses, and is spread mainly by coughing, sneezing, and close contact.     Anyone can get flu. Flu strikes suddenly and can last several days. Symptoms vary by age, but can include:  ??? fever/chills  ??? sore throat  ??? muscle aches  ??? fatigue  ??? cough  ??? headache   ??? runny or stuffy nose    Flu can also lead to pneumonia and blood infections, and cause diarrhea and seizures in children.  If you have a medical condition, such as heart or lung disease, flu can make it worse.    Flu is more dangerous for some people. Infants and young children, people 65 years of age and older, pregnant women, and people with certain health conditions or a weakened immune system are at greatest risk.      Each year thousands of people in the [place] die from flu, and many more are hospitalized.     Flu vaccine can:  ??? keep you from getting flu,  ??? make flu less severe if you do get it, and  ??? keep you from spreading flu to your family and other people.     2. Inactivated and recombinant flu vaccines    A dose of flu vaccine is recommended every flu season. Children 6 months through 8 years of age may need two doses during the same flu season.  Everyone else needs only one dose each flu season.       Some inactivated flu vaccines contain a very small amount of a mercury-based preservative called thimerosal. Studies have not shown  thimerosal in vaccines to be harmful, but flu vaccines that do not contain thimerosal are available.    There is no live flu virus in flu shots.  They cannot cause the flu.     There are many flu viruses, and they are always changing. Each year a new flu vaccine is made to protect against three or four viruses that are likely to cause disease in the upcoming flu season. But even when the vaccine doesn???t exactly match these viruses, it may still provide some protection    Flu vaccine cannot prevent:  ??? flu that is caused by a virus not covered by the vaccine, or  ??? illnesses that look like flu but are not.    It takes about 2 Coffin for protection to develop after vaccination, and protection lasts through the flu season.     3. Some people should not get this vaccine    Tell the person who is giving you the vaccine:    ??? If you have any severe, life-threatening allergies.    If you ever had a life-threatening allergic reaction after a dose of flu vaccine, or have a severe allergy to any part of this vaccine, you may be advised not to get vaccinated.  Most, but not all, types of flu vaccine contain a small amount of   egg protein.       ??? If you ever had Guillain-Barr?? Syndrome (also called GBS).   Some people with a history of GBS should not get this vaccine. This should be discussed with your doctor.    ??? If you are not feeling well.    It is usually okay to get flu vaccine when you have a mild illness, but you might be asked to come back when you feel better.      4. Risks of a vaccine reaction    With any medicine, including vaccines, there is a chance of reactions. These are usually mild and go away on their own, but serious reactions are also possible.     Most people who get a flu shot do not have any problems with it.     Minor problems following a flu shot include:   ??? soreness, redness, or swelling where the shot was given    ??? hoarseness  ??? sore, red or itchy eyes  ??? cough  ??? fever  ??? aches  ??? headache   ??? itching  ??? fatigue  If these problems occur, they usually begin soon after the shot and last 1 or 2 days.     More serious problems following a flu shot can include the following:    ??? There may be a small increased risk of Guillain-Barr?? Syndrome (GBS) after inactivated flu vaccine.  This risk has been estimated at 1 or 2 additional cases per million people vaccinated. This is much lower than the risk of severe complications from flu, which can be prevented by flu vaccine.      ??? Young children who get the flu shot along with pneumococcal vaccine (PCV13) and/or DTaP vaccine at the same time might be slightly more likely to have a seizure caused by fever. Ask your doctor for more information. Tell your doctor if a child who is getting flu vaccine has ever had a seizure.     Problems that could happen after any injected vaccine:     ??? People sometimes faint after a medical procedure, including vaccination. Sitting or lying down for about 15 minutes can help prevent fainting, and injuries caused by a fall. Tell your doctor if you feel dizzy, or have vision changes or ringing in the ears.    ??? Some people get severe pain in the shoulder and have difficulty moving the arm where a shot was given. This happens very rarely.    ??? Any medication can cause a severe allergic reaction. Such reactions from a vaccine are very rare, estimated at about 1 in a million doses, and would happen within a few minutes to a few hours after the vaccination.    As with any medicine, there is a very remote chance of a vaccine causing a serious injury or death.    The safety of vaccines is always being monitored. For more information, visit: www.cdc.gov/vaccinesafety/    5. What if there is a serious reaction?    What should I look for?    ??? Look for anything that concerns you, such as signs of a severe allergic reaction, very high fever, or unusual behavior.    Signs of a severe allergic reaction can include hives, swelling of the  face and throat, difficulty breathing, a fast heartbeat, dizziness, and weakness ??? usually within a few minutes to a few hours after the vaccination.    What should I do?    ??? If you   think it is a severe allergic reaction or other emergency that can???t wait, call 9-1-1 and get the person to the nearest hospital. Otherwise, call your doctor.    ??? Reactions should be reported to the Vaccine Adverse Event Reporting System (VAERS). Your doctor should file this report, or you can do it yourself through  the VAERS web site at www.vaers.hhs.gov, or by calling 1-800-822-7967.    VAERS does not give medical advice.    6. The National Vaccine Injury Compensation Program    The National Vaccine Injury Compensation Program (VICP) is a federal program that was created to compensate people who may have been injured by certain vaccines.    Persons who believe they may have been injured by a vaccine can learn about the program and about filing a claim by calling 1-800-338-2382 or visiting the VICP website at www.hrsa.gov/vaccinecompensation.  There is a time limit to file a claim for compensation.    7. How can I learn more?  ??? Ask your healthcare provider. He or she can give you the vaccine package insert or suggest other sources of information.  ??? Call your local or state health department.  ??? Contact the Centers for Disease Control and Prevention (CDC):  - Call 1-800-232-4636 (1-800-CDC-INFO) or  - Visit CDC???s website at www.cdc.gov/flu    Vaccine Information Statement   Inactivated Influenza Vaccine   11/06/2013  42 U[place] ?? [id number]    Department of Health and Human Services  Centers for Disease Control and Prevention    Office Use Only

## 2015-12-20 NOTE — Progress Notes (Signed)
HISTORY OF PRESENT ILLNESS  Shelby Mora  is a 82 y.o. female     Dysuria-  Having suprapubic pressuee when she urinates.  No ascending pain.  No feverr, chills.      Widened pulse pressure-  BP 120/40.      Edema-  Having some edema.      Dysuria   The history is provided by the patient. The current episode started more than 2 days ago. Associated symptoms include abdominal pain. Pertinent negatives include no chest pain, no headaches and no shortness of breath.         Review of Systems   Constitutional: Negative for fever and weight loss.   HENT: Negative for hearing loss.    Eyes: Negative for blurred vision.   Respiratory: Negative for shortness of breath.    Cardiovascular: Negative for chest pain and leg swelling.   Gastrointestinal: Positive for abdominal pain. Negative for nausea.   Genitourinary: Positive for difficulty urinating, dysuria and urgency.   Musculoskeletal: Negative for myalgias.   Skin: Negative for rash.   Neurological: Negative for dizziness and headaches.   Endo/Heme/Allergies: Does not bruise/bleed easily.   Psychiatric/Behavioral: Negative for depression.         Physical Exam   HENT:   Mouth/Throat: No oropharyngeal exudate.   Eyes: No scleral icterus.   Neck: No thyromegaly present.   Cardiovascular: Exam reveals no gallop and no friction rub.    No murmur heard.  Pulmonary/Chest: No respiratory distress. She has no wheezes. She has no rales.   Abdominal: She exhibits no distension. There is tenderness. There is no rebound.   Suprapubic tenderness   Musculoskeletal: She exhibits edema.   Lymphadenopathy:     She has no cervical adenopathy.   Skin: No rash noted.   Psychiatric: Affect normal.       ICD-10-CM ICD-9-CM    1. Urinary tract infection without hematuria, site unspecified N39.0 599.0 RENAL FUNCTION PANEL   2. Dysuria R30.0 788.1 AMB POC URINALYSIS DIP STICK AUTO W/O MICRO      CULTURE, URINE      ciprofloxacin HCl (CIPRO) 250 mg tablet    3. Edema, unspecified type R60.9 782.3    4. Widened pulse pressure R09.89 785.9    5. Encounter for immunization Z23 V03.89 ADMIN INFLUENZA VIRUS VAC      INFLUENZA VIRUS VACCINE, HIGH DOSE SEASONAL, PRESERVATIVE FREE     Treat UTI  Edema likely ven insufficiency.  Stop norvasc as should help edema and widened pulse pressure.      Pt with sig amt of comorbidities.      Greater than 50% of this 25 minutes visit was spent counseling the patient.

## 2015-12-20 NOTE — Progress Notes (Signed)
Shelby EkeBarbara Mora is a 82 y.o. female who presents for routine immunizations.   She denies any symptoms , reactions or allergies that would exclude them from being immunized today.  Risks and adverse reactions were discussed and the VIS was given to them. All questions were addressed.  She was observed for 5 min post injection. There were no reactions observed.    Renato Spellman A. Laquia Rosano

## 2015-12-21 ENCOUNTER — Telehealth

## 2015-12-21 MED ORDER — CEFPODOXIME 100 MG TAB
100 mg | ORAL_TABLET | Freq: Two times a day (BID) | ORAL | 0 refills | Status: AC
Start: 2015-12-21 — End: 2015-12-26

## 2015-12-21 NOTE — Telephone Encounter (Signed)
[employer] will not approve Cipro, can we change to Bactrim? [facility] , [address], [employer] [employer]

## 2015-12-21 NOTE — Telephone Encounter (Signed)
vantin 100 mg bid

## 2015-12-21 NOTE — Telephone Encounter (Signed)
Pharmacy wont release cipro due to drug interaction

## 2015-12-22 LAB — CULTURE, URINE

## 2015-12-23 NOTE — Telephone Encounter (Signed)
[facility] wanted to let Dr. Kennedy Bucker know that they are going to re certify for another 60 days due to recent medication changes for new UTI.   Requesting a call back for verbal OK.

## 2015-12-23 NOTE — Telephone Encounter (Signed)
Verbal order given

## 2015-12-23 NOTE — Progress Notes (Signed)
Antibiotic we placed her on susceptible to vantin

## 2016-01-10 ENCOUNTER — Encounter: Admit: 2016-01-10 | Discharge: 2016-01-10 | Payer: MEDICARE | Primary: Internal Medicine

## 2016-01-10 DIAGNOSIS — R7301 Impaired fasting glucose: Secondary | ICD-10-CM

## 2016-01-11 LAB — METABOLIC PANEL, COMPREHENSIVE
A-G Ratio: 1.1 — ABNORMAL LOW (ref 1.2–2.2)
ALT (SGPT): 9 IU/L (ref 0–32)
AST (SGOT): 13 IU/L (ref 0–40)
Albumin: 3.6 g/dL (ref 3.5–4.7)
Alk. phosphatase: 62 IU/L (ref 39–117)
BUN/Creatinine ratio: 25 (ref 12–28)
BUN: 17 mg/dL (ref 8–27)
Bilirubin, total: 0.3 mg/dL (ref 0.0–1.2)
CO2: 29 mmol/L (ref 18–29)
Calcium: 9 mg/dL (ref 8.7–10.3)
Chloride: 99 mmol/L (ref 96–106)
Creatinine: 0.68 mg/dL (ref 0.57–1.00)
GFR est AA: 94 mL/min/{1.73_m2} (ref >59)
GFR est non-AA: 82 mL/min/{1.73_m2} (ref >59)
GLOBULIN, TOTAL: 3.4 g/dL (ref 1.5–4.5)
Glucose: 85 mg/dL (ref 65–99)
Potassium: 3.9 mmol/L (ref 3.5–5.2)
Protein, total: 7 g/dL (ref 6.0–8.5)
Sodium: 142 mmol/L (ref 134–144)

## 2016-01-11 LAB — CBC WITH AUTOMATED DIFF
ABS. BASOPHILS: 0 10*3/uL (ref 0.0–0.2)
ABS. EOSINOPHILS: 0.4 10*3/uL (ref 0.0–0.4)
ABS. IMM. GRANS.: 0 10*3/uL (ref 0.0–0.1)
ABS. MONOCYTES: 0.7 10*3/uL (ref 0.1–0.9)
ABS. NEUTROPHILS: 6.8 10*3/uL (ref 1.4–7.0)
Abs Lymphocytes: 2.2 10*3/uL (ref 0.7–3.1)
BASOPHILS: 0 %
EOSINOPHILS: 4 %
HCT: 32.1 % — ABNORMAL LOW (ref 34.0–46.6)
HGB: 10.2 g/dL — ABNORMAL LOW (ref 11.1–15.9)
IMMATURE GRANULOCYTES: 0 %
Lymphocytes: 22 %
MCH: 26 pg — ABNORMAL LOW (ref 26.6–33.0)
MCHC: 31.8 g/dL (ref 31.5–35.7)
MCV: 82 fL (ref 79–97)
MONOCYTES: 7 %
NEUTROPHILS: 67 %
PLATELET: 301 10*3/uL (ref 150–379)
RBC: 3.93 x10E6/uL (ref 3.77–5.28)
RDW: 15.1 % (ref 12.3–15.4)
WBC: 10.1 10*3/uL (ref 3.4–10.8)

## 2016-01-11 LAB — HEMOGLOBIN A1C WITH EAG
Estimated average glucose: 123 mg/dL
Hemoglobin A1c: 5.9 % — ABNORMAL HIGH (ref 4.8–5.6)

## 2016-01-16 ENCOUNTER — Encounter: Payer: MEDICARE | Attending: Internal Medicine | Primary: Internal Medicine

## 2016-01-25 NOTE — Telephone Encounter (Signed)
Pt had low Hb. Missed appt.  Should f/u. Will have office call patient for f/u

## 2016-01-25 NOTE — Telephone Encounter (Signed)
Called, LVM on daughters phone asking for return call

## 2016-02-01 ENCOUNTER — Other Ambulatory Visit: Payer: Self-pay | Admitting: Family Medicine

## 2016-02-02 NOTE — Telephone Encounter (Signed)
Last office visit 09/20/2015.  Last refilled 10/21/2015 for #40 with no refills.  Request is coming from [facility] in [place]. [place].  Ok to refill?

## 2016-02-02 NOTE — Telephone Encounter (Signed)
Left message for French Ana (daughter) to return my call.  Need to verify if patient is living with her in [place] and if they plan on establishing care with a doctor there or will Ms. Berroa still be coming here to see Dr. Patsy Lager.

## 2016-02-02 NOTE — Telephone Encounter (Signed)
Ok to refill #40, 0 ref   Has she moved to live with her daughter or splitting time with living here?

## 2016-02-03 MED ORDER — IPRATROPIUM-ALBUTEROL 0.5-2.5 (3) MG/3ML IN SOLN: 3.0000 mL | Freq: Four times a day (QID) | RESPIRATORY_TRACT | 0 refills | Status: DC | PRN

## 2016-02-03 NOTE — Telephone Encounter (Signed)
Alprazolam called into [facility] in [facility].  Spoke with French Ana who states Ms. Simon is living in [facility] at this time.  She has found her a new PCP but would appreciate one refill on the alprazolam and Duoneb solution.   Advised we would send in refills but next refills will needs to come from new PCP.[name] French Ana states understanding.

## 2016-02-09 ENCOUNTER — Emergency Department: Payer: Medicare Other

## 2016-02-09 ENCOUNTER — Inpatient Hospital Stay
Admission: EM | Admit: 2016-02-09 | Discharge: 2016-02-13 | DRG: 189 | Disposition: A | Discharge status: Skilled Nursing Facility | Payer: Medicare Other | Attending: Internal Medicine | Admitting: Internal Medicine

## 2016-02-09 ENCOUNTER — Encounter: Payer: Self-pay | Admitting: Emergency Medicine

## 2016-02-09 DIAGNOSIS — I2510 Atherosclerotic heart disease of native coronary artery without angina pectoris: Secondary | ICD-10-CM | POA: Diagnosis present

## 2016-02-09 DIAGNOSIS — J9621 Acute and chronic respiratory failure with hypoxia: Secondary | ICD-10-CM | POA: Diagnosis present

## 2016-02-09 DIAGNOSIS — R2681 Unsteadiness on feet: Secondary | ICD-10-CM

## 2016-02-09 DIAGNOSIS — I110 Hypertensive heart disease with heart failure: Secondary | ICD-10-CM | POA: Diagnosis present

## 2016-02-09 DIAGNOSIS — I5033 Acute on chronic diastolic (congestive) heart failure: Secondary | ICD-10-CM | POA: Diagnosis present

## 2016-02-09 DIAGNOSIS — J9622 Acute and chronic respiratory failure with hypercapnia: Secondary | ICD-10-CM | POA: Diagnosis present

## 2016-02-09 DIAGNOSIS — Z79891 Long term (current) use of opiate analgesic: Secondary | ICD-10-CM

## 2016-02-09 DIAGNOSIS — G2581 Restless legs syndrome: Secondary | ICD-10-CM | POA: Diagnosis present

## 2016-02-09 DIAGNOSIS — J441 Chronic obstructive pulmonary disease with (acute) exacerbation: Secondary | ICD-10-CM | POA: Diagnosis present

## 2016-02-09 DIAGNOSIS — Z87891 Personal history of nicotine dependence: Secondary | ICD-10-CM

## 2016-02-09 DIAGNOSIS — N390 Urinary tract infection, site not specified: Secondary | ICD-10-CM | POA: Diagnosis present

## 2016-02-09 DIAGNOSIS — G8929 Other chronic pain: Secondary | ICD-10-CM | POA: Diagnosis present

## 2016-02-09 DIAGNOSIS — Z7982 Long term (current) use of aspirin: Secondary | ICD-10-CM | POA: Diagnosis not present

## 2016-02-09 DIAGNOSIS — D649 Anemia, unspecified: Secondary | ICD-10-CM | POA: Diagnosis present

## 2016-02-09 DIAGNOSIS — M6281 Muscle weakness (generalized): Secondary | ICD-10-CM | POA: Diagnosis not present

## 2016-02-09 DIAGNOSIS — T380X5A Adverse effect of glucocorticoids and synthetic analogues, initial encounter: Secondary | ICD-10-CM | POA: Diagnosis not present

## 2016-02-09 DIAGNOSIS — Z96612 Presence of left artificial shoulder joint: Secondary | ICD-10-CM | POA: Diagnosis present

## 2016-02-09 DIAGNOSIS — Z79899 Other long term (current) drug therapy: Secondary | ICD-10-CM | POA: Diagnosis not present

## 2016-02-09 DIAGNOSIS — Z7951 Long term (current) use of inhaled steroids: Secondary | ICD-10-CM | POA: Diagnosis not present

## 2016-02-09 DIAGNOSIS — R531 Weakness: Secondary | ICD-10-CM | POA: Diagnosis present

## 2016-02-09 DIAGNOSIS — I5032 Chronic diastolic (congestive) heart failure: Secondary | ICD-10-CM | POA: Diagnosis not present

## 2016-02-09 DIAGNOSIS — E876 Hypokalemia: Secondary | ICD-10-CM | POA: Diagnosis not present

## 2016-02-09 DIAGNOSIS — Z882 Allergy status to sulfonamides status: Secondary | ICD-10-CM | POA: Diagnosis not present

## 2016-02-09 DIAGNOSIS — E785 Hyperlipidemia, unspecified: Secondary | ICD-10-CM | POA: Diagnosis present

## 2016-02-09 DIAGNOSIS — I5031 Acute diastolic (congestive) heart failure: Secondary | ICD-10-CM | POA: Diagnosis not present

## 2016-02-09 DIAGNOSIS — E1149 Type 2 diabetes mellitus with other diabetic neurological complication: Secondary | ICD-10-CM | POA: Diagnosis present

## 2016-02-09 DIAGNOSIS — R0689 Other abnormalities of breathing: Secondary | ICD-10-CM

## 2016-02-09 DIAGNOSIS — Z880 Allergy status to penicillin: Secondary | ICD-10-CM | POA: Diagnosis not present

## 2016-02-09 DIAGNOSIS — Z885 Allergy status to narcotic agent status: Secondary | ICD-10-CM

## 2016-02-09 DIAGNOSIS — J449 Chronic obstructive pulmonary disease, unspecified: Secondary | ICD-10-CM | POA: Diagnosis not present

## 2016-02-09 DIAGNOSIS — Z96611 Presence of right artificial shoulder joint: Secondary | ICD-10-CM | POA: Diagnosis present

## 2016-02-09 DIAGNOSIS — Z888 Allergy status to other drugs, medicaments and biological substances status: Secondary | ICD-10-CM

## 2016-02-09 DIAGNOSIS — Z951 Presence of aortocoronary bypass graft: Secondary | ICD-10-CM

## 2016-02-09 DIAGNOSIS — J9620 Acute and chronic respiratory failure, unspecified whether with hypoxia or hypercapnia: Secondary | ICD-10-CM | POA: Diagnosis present

## 2016-02-09 LAB — CBC
HCT: 31.1 % — ABNORMAL LOW (ref 35.0–47.0)
HEMOGLOBIN: 9.8 g/dL — AB (ref 12.0–16.0)
MCH: 25.4 pg — AB (ref 26.0–34.0)
MCHC: 31.5 g/dL — ABNORMAL LOW (ref 32.0–36.0)
MCV: 80.5 fL (ref 80.0–100.0)
Platelets: 351 10*3/uL (ref 150–440)
RBC: 3.86 MIL/uL (ref 3.80–5.20)
RDW: 15.3 % — ABNORMAL HIGH (ref 11.5–14.5)
WBC: 12.8 10*3/uL — ABNORMAL HIGH (ref 3.6–11.0)

## 2016-02-09 LAB — BASIC METABOLIC PANEL
ANION GAP: 5 (ref 5–15)
BUN: 18 mg/dL (ref 6–20)
CALCIUM: 9 mg/dL (ref 8.9–10.3)
CO2: 38 mmol/L — AB (ref 22–32)
Chloride: 94 mmol/L — ABNORMAL LOW (ref 101–111)
Creatinine, Ser: 0.55 mg/dL (ref 0.44–1.00)
Glucose, Bld: 279 mg/dL — ABNORMAL HIGH (ref 65–99)
Potassium: 2.9 mmol/L — ABNORMAL LOW (ref 3.5–5.1)
Sodium: 137 mmol/L (ref 135–145)

## 2016-02-09 LAB — URINALYSIS COMPLETE WITH MICROSCOPIC (ARMC ONLY)
BILIRUBIN URINE: NEGATIVE
Bacteria, UA: NONE SEEN
Glucose, UA: >500 mg/dL — AB
KETONES UR: NEGATIVE mg/dL
NITRITE: NEGATIVE
PH: 6 (ref 5.0–8.0)
Protein, ur: 100 mg/dL — AB
SPECIFIC GRAVITY, URINE: 1.015 (ref 1.005–1.030)

## 2016-02-09 LAB — TROPONIN I: Troponin I: 0.04 ng/mL (ref <0.03)

## 2016-02-09 LAB — PROCALCITONIN: Procalcitonin: <0.1 ng/mL

## 2016-02-09 LAB — BRAIN NATRIURETIC PEPTIDE: B NATRIURETIC PEPTIDE 5: 176 pg/mL — AB (ref 0.0–100.0)

## 2016-02-09 LAB — MAGNESIUM: Magnesium: 1.8 mg/dL (ref 1.7–2.4)

## 2016-02-09 MED ORDER — POTASSIUM CHLORIDE 20 MEQ/15ML (10%) PO SOLN
40.0000 meq | Freq: Every day | ORAL | Status: DC
  Filled 2016-02-09: qty 30

## 2016-02-09 MED ORDER — CIPROFLOXACIN IN D5W 400 MG/200ML IV SOLN: 400.0000 mg | Freq: Once | INTRAVENOUS | Status: DC

## 2016-02-09 MED ORDER — LEVOFLOXACIN IN D5W 250 MG/50ML IV SOLN
250.0000 mg | Freq: Once | INTRAVENOUS | Status: AC
  Administered 2016-02-09: 250 mg via INTRAVENOUS
  Filled 2016-02-09: qty 50

## 2016-02-09 MED ORDER — RAMIPRIL 10 MG PO CAPS
10.0000 mg | ORAL_CAPSULE | Freq: Every day | ORAL | Status: DC
  Administered 2016-02-09 – 2016-02-12 (×4): 10 mg via ORAL
  Filled 2016-02-09 (×5): qty 1

## 2016-02-09 MED ORDER — MAGNESIUM SULFATE 2 GM/50ML IV SOLN
2.0000 g | Freq: Once | INTRAVENOUS | Status: AC
  Administered 2016-02-10: 2 g via INTRAVENOUS
  Filled 2016-02-09 (×2): qty 50

## 2016-02-09 MED ORDER — ENOXAPARIN SODIUM 40 MG/0.4ML ~~LOC~~ SOLN
SUBCUTANEOUS | Status: AC
  Administered 2016-02-09: 40 mg via SUBCUTANEOUS
  Filled 2016-02-09: qty 0.4

## 2016-02-09 MED ORDER — ONDANSETRON HCL 4 MG/2ML IJ SOLN: 4.0000 mg | Freq: Four times a day (QID) | INTRAMUSCULAR | Status: DC | PRN

## 2016-02-09 MED ORDER — ASPIRIN EC 81 MG PO TBEC
81.0000 mg | DELAYED_RELEASE_TABLET | Freq: Every day | ORAL | Status: DC
  Administered 2016-02-09 – 2016-02-13 (×5): 81 mg via ORAL
  Filled 2016-02-09 (×5): qty 1

## 2016-02-09 MED ORDER — POTASSIUM CHLORIDE 20 MEQ PO PACK
40.0000 meq | PACK | Freq: Once | ORAL | Status: AC
  Administered 2016-02-09: 40 meq via ORAL

## 2016-02-09 MED ORDER — POTASSIUM CHLORIDE 20 MEQ PO PACK
PACK | ORAL | Status: AC
  Administered 2016-02-09: 40 meq via ORAL
  Filled 2016-02-09: qty 2

## 2016-02-09 MED ORDER — AMLODIPINE BESYLATE 5 MG PO TABS
5.0000 mg | ORAL_TABLET | Freq: Every day | ORAL | Status: DC
  Administered 2016-02-09 – 2016-02-13 (×5): 5 mg via ORAL
  Filled 2016-02-09 (×5): qty 1

## 2016-02-09 MED ORDER — METOPROLOL TARTRATE 50 MG PO TABS
50.0000 mg | ORAL_TABLET | Freq: Two times a day (BID) | ORAL | Status: DC
  Administered 2016-02-09 – 2016-02-13 (×8): 50 mg via ORAL
  Filled 2016-02-09 (×8): qty 1

## 2016-02-09 MED ORDER — TIOTROPIUM BROMIDE MONOHYDRATE 18 MCG IN CAPS
18.0000 ug | ORAL_CAPSULE | Freq: Every day | RESPIRATORY_TRACT | Status: DC
  Administered 2016-02-09 – 2016-02-12 (×4): 18 ug via RESPIRATORY_TRACT
  Filled 2016-02-09 (×2): qty 5

## 2016-02-09 MED ORDER — RISAQUAD PO CAPS
1.0000 | ORAL_CAPSULE | Freq: Every day | ORAL | Status: DC
  Administered 2016-02-09 – 2016-02-13 (×5): 1 via ORAL
  Filled 2016-02-09 (×5): qty 1

## 2016-02-09 MED ORDER — LEVOFLOXACIN IN D5W 250 MG/50ML IV SOLN
250.0000 mg | INTRAVENOUS | Status: DC
  Filled 2016-02-09: qty 50

## 2016-02-09 MED ORDER — OXYCODONE HCL 5 MG PO TABS
5.0000 mg | ORAL_TABLET | ORAL | Status: DC | PRN
  Administered 2016-02-09 – 2016-02-10 (×3): 5 mg via ORAL
  Filled 2016-02-09 (×4): qty 1

## 2016-02-09 MED ORDER — ENOXAPARIN SODIUM 40 MG/0.4ML ~~LOC~~ SOLN
40.0000 mg | SUBCUTANEOUS | Status: DC
  Administered 2016-02-09 – 2016-02-12 (×4): 40 mg via SUBCUTANEOUS
  Filled 2016-02-09 (×4): qty 0.4

## 2016-02-09 MED ORDER — ACETAMINOPHEN 325 MG PO TABS
650.0000 mg | ORAL_TABLET | Freq: Four times a day (QID) | ORAL | Status: DC | PRN
Start: 2016-02-09 — End: 2016-02-13
  Administered 2016-02-11 – 2016-02-13 (×7): 650 mg via ORAL
  Filled 2016-02-09 (×7): qty 2

## 2016-02-09 MED ORDER — IPRATROPIUM-ALBUTEROL 0.5-2.5 (3) MG/3ML IN SOLN
3.0000 mL | RESPIRATORY_TRACT | Status: DC | PRN
  Administered 2016-02-10 (×2): 3 mL via RESPIRATORY_TRACT
  Filled 2016-02-09 (×2): qty 3

## 2016-02-09 MED ORDER — ATORVASTATIN CALCIUM 20 MG PO TABS
40.0000 mg | ORAL_TABLET | Freq: Every day | ORAL | Status: DC
  Administered 2016-02-10 – 2016-02-12 (×3): 40 mg via ORAL
  Filled 2016-02-09 (×2): qty 2

## 2016-02-09 MED ORDER — MOMETASONE FURO-FORMOTEROL FUM 200-5 MCG/ACT IN AERO
2.0000 | INHALATION_SPRAY | Freq: Two times a day (BID) | RESPIRATORY_TRACT | Status: DC
  Administered 2016-02-09 – 2016-02-11 (×4): 2 via RESPIRATORY_TRACT
  Filled 2016-02-09: qty 8.8

## 2016-02-09 MED ORDER — FUROSEMIDE 20 MG PO TABS
20.0000 mg | ORAL_TABLET | Freq: Every day | ORAL | Status: DC
  Administered 2016-02-10 – 2016-02-11 (×2): 20 mg via ORAL
  Filled 2016-02-09 (×2): qty 1

## 2016-02-09 MED ORDER — DOCUSATE SODIUM 100 MG PO CAPS
100.0000 mg | ORAL_CAPSULE | Freq: Two times a day (BID) | ORAL | Status: DC | PRN
  Administered 2016-02-12: 100 mg via ORAL
  Filled 2016-02-09: qty 1

## 2016-02-09 MED ORDER — FUROSEMIDE 10 MG/ML IJ SOLN
40.0000 mg | Freq: Once | INTRAMUSCULAR | Status: AC
  Administered 2016-02-09: 40 mg via INTRAVENOUS
  Filled 2016-02-09: qty 4

## 2016-02-09 MED ORDER — ONDANSETRON HCL 4 MG PO TABS: 4.0000 mg | ORAL_TABLET | Freq: Four times a day (QID) | ORAL | Status: DC | PRN

## 2016-02-09 MED ORDER — ACETAMINOPHEN 650 MG RE SUPP: 650.0000 mg | Freq: Four times a day (QID) | RECTAL | Status: DC | PRN

## 2016-02-09 NOTE — ED Notes (Signed)
Pt's family reports productive cough x3 days. CBG per [facility] 376, reports no current dx of DM.

## 2016-02-09 NOTE — ED Provider Notes (Signed)
[facility] Emergency Department Provider Note  ____________________________________________   I have reviewed the triage vital signs and the nursing notes.   HISTORY  Chief Complaint Weakness    HPI [name] is a 82 y.o. female with multiple different medical problems CAD CHF chronic back pain and COPD elevated left hemidiaphragm history of C. difficile in the past, home oxygen use, presents today complaining of weakness. She'll wake up and tell me that she's been sleepy and weak for the last 3 days. Family states that she is in the point where she does not walk.He has had a few falls benign last couple days. She has had a cough. She has not had increase her oxygen. She denies any fever or chills. She denies dysuria or urinary frequency.  Estrace somewhat limited tLevel 5 chart caveat; no further history available due to patient status.   Past Medical History:  Diagnosis Date  . Adrenal mass (HCC)    a. Excision 2007.  [name] Anemia, deficiency 07/01/2004   (w/u by Dr. Lorre Nick)  . Anxiety and depression   . Arteriovenous malformation of gastrointestinal tract   . C. difficile colitis    a. 04/2008 = severe.  [name] CAD (coronary artery disease) 09/01/2002   a. s/p NSTEMI 2012. b. s/p CABG 01/2012.  [name] CHF (congestive heart failure) (HCC)   . Chronic back pain   . Chronic back pain greater than 3 months duration 11/30/2014  . COPD (chronic obstructive pulmonary disease) (HCC) 05/31/2004  . Diabetes mellitus type II 07/31/1997  . diverticulosis of colon   . Fatty liver   . GI bleed    a. 04/2011 felt diverticular requiring microembolization by vascular.  [name] Hx of CABG 04/18/2012  . Hyperlipemia 07/02/1999  . Hypertension 08/01/1995  . Osteoarthritis 04/03/1983  . Osteoporosis 12/31/2000  . PAF (paroxysmal atrial fibrillation) (HCC) 12/16/2013  . Restless leg syndrome   . Rheumatoid arthritis(714.0) 04/03/1983  . Splenic artery aneurysm (HCC)   . Status  post replacement of both shoulder joints 02/09/2014    Patient Active Problem List   Diagnosis Date Noted  . Tobacco abuse counseling 09/27/2015  . 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 Chronic respiratory failure with hypoxia (HCC) 04/14/2015  . Failed back syndrome of lumbar spine 11/30/2014  . Pedal edema 08/31/2014  . 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 PAF (paroxysmal atrial fibrillation) (HCC) 12/16/2013  . History of GI diverticular bleed 09/25/2012  . Depression 06/17/2012  . Hx of CABG 04/18/2012  . Acute on chronic diastolic CHF (congestive heart failure) (HCC) 02/05/2012  . Splenic artery aneurysm (HCC)   . History of non-ST elevation myocardial infarction (NSTEMI) 07/22/2011  . GIB (gastrointestinal bleeding) 04/25/2011  . Smoking 02/09/2011  . AAA (abdominal aortic aneurysm) (HCC) 12/22/2010  . DIVERTICULOSIS OF COLON 05/14/2008  . ANXIETY DEPRESSION 04/19/2008  . ECZEMA 07/21/2007  . ARTERIOVENOUS MALFORMATION, GASTRIC 11/08/2006  . INSOMNIA, CHRONIC 07/21/2006  . ANEMIA-IRON DEFICIENCY 07/01/2004  . 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 CAD (coronary artery disease) 09/01/2002  . FATTY LIVER DISEASE 07/16/2002  . OSTEOPOROSIS 12/31/2000  . HYPERLIPIDEMIA 07/02/1999  . 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 Essential hypertension, benign 08/01/1995  . Rheumatoid arthritis(714.0) 04/03/1983  . OSTEOARTHRITIS 04/03/1983    Past Surgical History:  Procedure Laterality Date  . ANKLE SURGERY     neuroma repair, right ORIF  . CARPAL TUNNEL RELEASE     bilat  . COLONIC  EMBOLIZATION  04/18/2011   Dr. Wyn Quaker, acute GI bleed  . CORONARY ARTERY BYPASS GRAFT  01/22/2012   Procedure: CORONARY ARTERY BYPASS GRAFTING (CABG);  Surgeon: Delight Ovens, MD;  Location[facility] OR;  Service: Open Heart Surgery;  Laterality: N/A;  Coronary Artery Bypass Graft times four utilizing the left internal  mammary artery and the left greater saphenous vein harvested endoscopically.  [name] EPIDURAL BLOCK INJECTION  05/2005   x 3  . INGUINAL HERNIA REPAIR    . LAMINECTOMY  06/2006   L4/5 spinal stenosis (Dr. [name])  . LAMINECTOMY  02/24/2008   L3/4 Ant/Lat interbody fusion and Lat Artthrodesis w/plate using XLIF (Dr. [name])  . long finger tendon realignment  10/31/2005   [place]  . lumbar epidural  06/13/2006  . NASAL SINUS SURGERY    . PARTIAL HYSTERECTOMY     ovaries intact, dysmennorhea w/ A/P repari  . ROTATOR CUFF REPAIR  11/22/2003   ([place]) left  . SPINAL CORD STIMULATOR IMPLANT    . TEE WITHOUT CARDIOVERSION  01/22/2012   Procedure: TRANSESOPHAGEAL ECHOCARDIOGRAM (TEE);  Surgeon: Delight Ovens, MD;  Location: [facility] OR;  Service: Open Heart Surgery;  Laterality: N/A;  . TOTAL SHOULDER REPLACEMENT  12/13/1998   right, ([place])  . TRIGGER FINGER RELEASE     right  . WRIST SURGERY     neuroma repair, right    Prior to Admission medications   Medication Sig Start Date End Date Taking? Authorizing Provider  acidophilus (RISAQUAD) CAPS capsule Take 1 capsule by mouth daily.    Historical Provider, MD  albuterol (PROVENTIL HFA;VENTOLIN HFA) 108 (90 Base) MCG/ACT inhaler Inhale 2 puffs into the lungs every 6 (six) hours as needed for wheezing or shortness of breath.    Historical Provider, MD  albuterol (PROVENTIL) (2.5 MG/3ML) 0.083% nebulizer solution Take 2.5 mg by nebulization every 4 (four) hours as needed for wheezing or shortness of breath.    Historical Provider, MD  ALPRAZolam Prudy Feeler) 0.25 MG tablet TAKE 1 TABLET BY MOUTH TWICE DAILY 02/02/16   Hannah Beat, MD  amLODipine (NORVASC) 5 MG tablet TAKE 1 TABLET(5 MG) BY MOUTH DAILY 11/21/15   Brittainy Sherlynn Carbon, PA-C  aspirin EC 81 MG tablet Take 1 tablet (81 mg total) by mouth daily. 08/13/13   [name], MD  atorvastatin (LIPITOR) 40 MG tablet TAKE 1 TABLET(40 MG) BY MOUTH DAILY 11/07/15   Brittainy Sherlynn Carbon, PA-C  cefdinir  (OMNICEF) 300 MG capsule Take 2 capsules (600 mg total) by mouth daily. 09/20/15   Hannah Beat, MD  docusate sodium (COLACE) 100 MG capsule Take 100 mg by mouth 2 (two) times daily as needed for mild constipation.    Historical Provider, MD  Fluticasone-Salmeterol (ADVAIR DISKUS) 500-50 MCG/DOSE AEPB Inhale 1 puff into the lungs 2 (two) times daily. 09/20/15   Hannah Beat, MD  furosemide (LASIX) 20 MG tablet Take 1 tablet (20 mg total) by mouth daily. 10/14/15 10/13/16  Emily Filbert, MD  ipratropium (ATROVENT) 0.02 % nebulizer solution Take 2.5 mLs (0.5 mg total) by nebulization every 6 (six) hours as needed for wheezing or shortness of breath. 08/10/15   Spencer Copland, MD  ipratropium-albuterol (DUONEB) 0.5-2.5 (3) MG/3ML SOLN Take 3 mLs by nebulization every 6 (six) hours as needed. 02/03/16   Hannah Beat, MD  levofloxacin (LEVAQUIN) 750 MG tablet Take 1 tablet (750 mg total) by mouth daily. 10/14/15   Emily Filbert, MD  Melatonin 5 MG TABS Take 5 mg by mouth at  bedtime as needed (for sleep).     Historical Provider, MD  metoprolol (LOPRESSOR) 50 MG tablet Take 1 tablet (50 mg total) by mouth 2 (two) times daily. 04/19/15   Brittainy Sherlynn Carbon, PA-C  morphine (MS CONTIN) 30 MG 12 hr tablet Take 30 mg by mouth every 12 (twelve) hours.    Historical Provider, MD  nitroGLYCERIN (NITROSTAT) 0.4 MG SL tablet Place 0.4 mg under the tongue every 5 (five) minutes as needed for chest pain.    Historical Provider, MD  Omega-3 Fatty Acids (FISH OIL) 1000 MG CAPS Take 1,000 mg by mouth daily.     Historical Provider, MD  oxyCODONE (OXYCONTIN) 30 MG 12 hr tablet Take 30 mg by mouth at bedtime.     Historical Provider, MD  predniSONE (DELTASONE) 20 MG tablet 2 tabs po for 7 days, then 1 tab po for 7 days 09/20/15   Hannah Beat, MD  predniSONE (STERAPRED UNI-PAK 21 TAB) 10 MG (21) TBPK tablet Dispense steroid taper pack as directed 10/14/15   Emily Filbert, MD  ramipril (ALTACE) 5 MG  capsule Take 2 capsules (10 mg total) by mouth at bedtime. 04/19/15   Brittainy Sherlynn Carbon, PA-C  tiotropium (SPIRIVA) 18 MCG inhalation capsule Place 1 capsule (18 mcg total) into inhaler and inhale daily. 09/20/15   Hannah Beat, MD  tiZANidine (ZANAFLEX) 4 MG tablet Take 2-4 mg by mouth at bedtime as needed for muscle spasms.    Historical Provider, MD  VITAMIN E PO Take 1 capsule by mouth daily.     Historical Provider, MD    Allergies Other; Penicillins; Sulfa antibiotics; Morphine sulfate; Macrolides and ketolides; Iron; and Naproxen  Family History  Problem Relation Age of Onset  . Hypotension Mother   . Dementia Mother   . Alcohol abuse Father   . Heart disease Father     MI, enlarged heart  . Diabetes Brother     Social History Social History  Substance Use Topics  . Smoking status: Former Smoker    Packs/day: 1.00    Years: 50.00    Types: Cigarettes    Quit date: 12/18/2014  . Smokeless tobacco: Former Neurosurgeon  . Alcohol use 0.0 oz/week     Comment: red wine occassionally    Review of Systems Constitutional: Some subjective fever Eyes: No visual changes. ENT: No sore throat. No stiff neck no neck pain Cardiovascular: Denies chest pain. Respiratory: Chronic shortness of breath. Gastrointestinal:   no vomiting.  No diarrhea.  No constipation. Genitourinary: Negative for dysuria. Musculoskeletal: Negative lower extremity swelling Skin: Negative for rash. Neurological: Negative for severe headaches, focal weakness or numbness. 10-point ROS otherwise negative.  ____________________________________________   PHYSICAL EXAM:  VITAL SIGNS: ED Triage Vitals  Enc Vitals Group     BP 02/09/16 1709 (!) 168/70     Pulse Rate 02/09/16 1709 93     Resp 02/09/16 1709 (!) 27     Temp 02/09/16 1709 98.9 F (37.2 C)     Temp Source 02/09/16 1709 Oral     SpO2 02/09/16 1709 92 %     Weight 02/09/16 1708 145 lb (65.8 kg)     Height 02/09/16 1708 5' (1.524 m)     Head  Circumference --      Peak Flow --      Pain Score --      Pain Loc --      Pain Edu? --      Excl. in GC? --  Constitutional: Somnolent but easily arousable, chronically ill appearing and not in any distress Eyes: Conjunctivae are normal. PERRL. EOMI. Head: Atraumatic. Nose: No congestion/rhinnorhea. Mouth/Throat: Mucous membranes are moist.  Oropharynx non-erythematous. Neck: No stridor.   Nontender with no meningismus Cardiovascular: Normal rate, regular rhythm. Grossly normal heart sounds.  Good peripheral circulation. Respiratory: Normal respiratory effort.  Very diminished in the bases, no obvious rales or rhonchi Abdominal: Soft and nontender. No distention. No guarding no rebound Back:  There is no focal tenderness or step off.  there is no midline tenderness there are no lesions noted. there is no CVA tenderness Musculoskeletal: No lower extremity tenderness, no upper extremity tenderness. No joint effusions, no DVT signs strong distal pulses no edema Neurologic: No obvious focality Limited exam Skin:  Skin is warm, dry and intact. No rash noted. Psychiatric: Mood and affect are normal. Speech and behavior are normal.  ____________________________________________   LABS (all labs ordered are listed, but only abnormal results are displayed)  Labs Reviewed  BASIC METABOLIC PANEL - Abnormal; Notable for the following:       Result Value   Potassium 2.9 (*)    Chloride 94 (*)    CO2 38 (*)    Glucose, Bld 279 (*)    All other components within normal limits  CBC - Abnormal; Notable for the following:    WBC 12.8 (*)    Hemoglobin 9.8 (*)    HCT 31.1 (*)    MCH 25.4 (*)    MCHC 31.5 (*)    RDW 15.3 (*)    All other components within normal limits  URINALYSIS COMPLETEWITH MICROSCOPIC ([facility] ONLY) - Abnormal; Notable for the following:    Color, Urine YELLOW (*)    APPearance CLEAR (*)    Glucose, UA >500 (*)    Hgb urine dipstick 1+ (*)    Protein, ur 100 (*)     Leukocytes, UA 2+ (*)    Squamous Epithelial / LPF 0-5 (*)    All other components within normal limits  TROPONIN I - Abnormal; Notable for the following:    Troponin I 0.04 (*)    All other components within normal limits  URINE CULTURE  MAGNESIUM   ____________________________________________  EKG  I personally interpreted any EKGs ordered by me or triage Sinus rhythm at 89 beats per an acute ST elevation or acute ST depression normal axis no specific ST changes ____________________________________________  RADIOLOGY  I reviewed any imaging ordered by me or triage that were performed during my shift and, if possible, patient and/or family made aware of any abnormal findings. ____________________________________________   PROCEDURES  Procedure(s) performed: None  Procedures  Critical Care performed: None  ____________________________________________   INITIAL IMPRESSION / ASSESSMENT AND PLAN / ED COURSE  Pertinent labs & imaging results that were available during my care of the patient were reviewed by me and considered in my medical decision making (see chart for details).  Patient feeling very weak over her baseline unable to get around the house, has a urinary tract infection. We will treat, this certainly could be the cause of her symptoms. CT head is negative no evidence of bleed. Chest x-ray is difficult to interpret but no obvious pneumonia present. We are replacing her potassium. White count mildly elevated anemia not far from baseline.  Clinical Course    ____________________________________________   FINAL CLINICAL IMPRESSION(S) / ED DIAGNOSES  Final diagnoses:  None      This chart was dictated using voice recognition  software.  Despite best efforts to proofread,  errors can occur which can change meaning.      Jeanmarie Plant, MD 02/09/16 [phone]

## 2016-02-09 NOTE — ED Triage Notes (Signed)
Pt presents to ED via [facility] c/o increased lethargy and generalized weakness x3 days. Pt's family state she is usually able to walk and care for herself but now is unable to walk. C/o pain to L flank and increased urination.

## 2016-02-09 NOTE — Progress Notes (Signed)
ABG: Reassuring, chronic CO2 retention

## 2016-02-09 NOTE — H&P (Signed)
[facility] - [place] at [facility]   PATIENT NAME: [name]    MR#:  [id number]  DATE OF BIRTH:  1931-07-13   DATE OF ADMISSION:  02/09/2016  PRIMARY CARE PHYSICIAN: Hannah Beat, MD   REQUESTING/REFERRING PHYSICIAN: McShane  CHIEF COMPLAINT:   Chief Complaint  Patient presents with  . Weakness    HISTORY OF PRESENT ILLNESS:  [name]  is a 82 y.o. female with a known history of chronic respiratory failure on to a half to 3 Loxygennasal cannula Baseline who is presenting with weakness. The patient is unable to provide any information given mental status history obtained from daughter who is present at bedside. She states progressively worsening over the last 3 days "weakness" which she describes as increased sleepiness and not moving. In the emergency department required higher level of oxygen to maintain saturations Once again patient unable to provide information given mental status/medical condition   PAST MEDICAL HISTORY:   Past Medical History:  Diagnosis Date  . Adrenal mass (HCC)    a. Excision 2007.  [name] Anemia, deficiency 07/01/2004   (w/u by Dr. Lorre Nick)  . Anxiety and depression   . Arteriovenous malformation of gastrointestinal tract   . C. difficile colitis    a. 04/2008 = severe.  [name] CAD (coronary artery disease) 09/01/2002   a. s/p NSTEMI 2012. b. s/p CABG 01/2012.  [name] CHF (congestive heart failure) (HCC)   . Chronic back pain   . Chronic back pain greater than 3 months duration 11/30/2014  . COPD (chronic obstructive pulmonary disease) (HCC) 05/31/2004  . Diabetes mellitus type II 07/31/1997  . diverticulosis of colon   . Fatty liver   . GI bleed    a. 04/2011 felt diverticular requiring microembolization by vascular.  [name] Hx of CABG 04/18/2012  . Hyperlipemia 07/02/1999  . Hypertension 08/01/1995  . Osteoarthritis 04/03/1983  . Osteoporosis 12/31/2000  . PAF (paroxysmal atrial fibrillation) (HCC) 12/16/2013  . Restless leg  syndrome   . Rheumatoid arthritis(714.0) 04/03/1983  . Splenic artery aneurysm (HCC)   . Status post replacement of both shoulder joints 02/09/2014    PAST SURGICAL HISTORY:   Past Surgical History:  Procedure Laterality Date  . ANKLE SURGERY     neuroma repair, right ORIF  . CARPAL TUNNEL RELEASE     bilat  . COLONIC EMBOLIZATION  04/18/2011   Dr. Wyn Quaker, acute GI bleed  . CORONARY ARTERY BYPASS GRAFT  01/22/2012   Procedure: CORONARY ARTERY BYPASS GRAFTING (CABG);  Surgeon: Delight Ovens, MD;  Location: [facility] OR;  Service: Open Heart Surgery;  Laterality: N/A;  Coronary Artery Bypass Graft times four utilizing the left internal mammary artery and the left greater saphenous vein harvested endoscopically.  [name] EPIDURAL BLOCK INJECTION  05/2005   x 3  . INGUINAL HERNIA REPAIR    . LAMINECTOMY  06/2006   L4/5 spinal stenosis (Dr. [name])  . LAMINECTOMY  02/24/2008   L3/4 Ant/Lat interbody fusion and Lat Artthrodesis w/plate using XLIF (Dr. [name])  . long finger tendon realignment  10/31/2005   [place]ks  . lumbar epidural  06/13/2006  . NASAL SINUS SURGERY    . PARTIAL HYSTERECTOMY     ovaries intact, dysmennorhea w/ A/P repari  . ROTATOR CUFF REPAIR  11/22/2003   ([facility]) left  . SPINAL CORD STIMULATOR IMPLANT    . TEE WITHOUT CARDIOVERSION  01/22/2012   Procedure: TRANSESOPHAGEAL ECHOCARDIOGRAM (TEE);  Surgeon: Delight Ovens, MD;  Location: [facility] OR;  Service:  Open Heart Surgery;  Laterality: N/A;  . TOTAL SHOULDER REPLACEMENT  12/13/1998   right, ([facility])  . TRIGGER FINGER RELEASE     right  . WRIST SURGERY     neuroma repair, right    SOCIAL HISTORY:   Social History  Substance Use Topics  . Smoking status: Former Smoker    Packs/day: 1.00    Years: 50.00    Types: Cigarettes    Quit date: 12/18/2014  . Smokeless tobacco: Former Neurosurgeon  . Alcohol use 0.0 oz/week     Comment: red wine occassionally    FAMILY HISTORY:   Family History  Problem Relation Age of Onset    . Hypotension Mother   . Dementia Mother   . Alcohol abuse Father   . Heart disease Father     MI, enlarged heart  . Diabetes Brother     DRUG ALLERGIES:   Allergies  Allergen Reactions  . Other Diarrhea, Rash and Other (See Comments)    Pt states that she is allergic to all -mycins.   Reaction: Sore tongue, raw mouth, and weakness  . Penicillins Other (See Comments)    Reaction:  Dizziness  Has patient had a PCN reaction causing immediate rash, facial/tongue/throat swelling, SOB or lightheadedness with hypotension: No Has patient had a PCN reaction causing severe rash involving mucus membranes or skin necrosis: No Has patient had a PCN reaction that required hospitalization No Has patient had a PCN reaction occurring within the last 10 years: No If all of the above answers are "NO", then may proceed with Cephalosporin use.  . Sulfa Antibiotics Shortness Of Breath  . Morphine Sulfate Nausea And Vomiting and Other (See Comments)    Reaction:  Hallucinations   . Macrolides And Ketolides Diarrhea and Nausea And Vomiting  . Iron Diarrhea  . Naproxen Rash    REVIEW OF SYSTEMS:  patient unable to provide information given mental status/medical condition    MEDICATIONS AT HOME:   Prior to Admission medications   Medication Sig Start Date End Date Taking? Authorizing 82 year old Caucasian female history of COPD oxygen requiring, diastolic congestive heart failureProvider  acidophilus (RISAQUAD) CAPS capsule Take 1 capsule by mouth daily.    Historical Provider, MD  albuterol (PROVENTIL HFA;VENTOLIN HFA) 108 (90 Base) MCG/ACT inhaler Inhale 2 puffs into the lungs every 6 (six) hours as needed for wheezing or shortness of breath.    Historical Provider, MD  albuterol (PROVENTIL) (2.5 MG/3ML) 0.083% nebulizer solution Take 2.5 mg by nebulization every 4 (four) hours as needed for wheezing or shortness of breath.    Historical Provider, MD  ALPRAZolam Prudy Feeler) 0.25 MG tablet TAKE 1  TABLET BY MOUTH TWICE DAILY 02/02/16   Hannah Beat, MD  amLODipine (NORVASC) 5 MG tablet TAKE 1 TABLET(5 MG) BY MOUTH DAILY 11/21/15   Brittainy Sherlynn Carbon, PA-C  aspirin EC 81 MG tablet Take 1 tablet (81 mg total) by mouth daily. 08/13/13   [name], MD  atorvastatin (LIPITOR) 40 MG tablet TAKE 1 TABLET(40 MG) BY MOUTH DAILY 11/07/15   Brittainy Sherlynn Carbon, PA-C  cefdinir (OMNICEF) 300 MG capsule Take 2 capsules (600 mg total) by mouth daily. 09/20/15   Hannah Beat, MD  docusate sodium (COLACE) 100 MG capsule Take 100 mg by mouth 2 (two) times daily as needed for mild constipation.    Historical Provider, MD  Fluticasone-Salmeterol (ADVAIR DISKUS) 500-50 MCG/DOSE AEPB Inhale 1 puff into the lungs 2 (two) times daily. 09/20/15   Hannah Beat, MD  furosemide (LASIX) 20 MG tablet Take 1 tablet (20 mg total) by mouth daily. 10/14/15 10/13/16  Emily Filbert, MD  ipratropium (ATROVENT) 0.02 % nebulizer solution Take 2.5 mLs (0.5 mg total) by nebulization every 6 (six) hours as needed for wheezing or shortness of breath. 08/10/15   Spencer Copland, MD  ipratropium-albuterol (DUONEB) 0.5-2.5 (3) MG/3ML SOLN Take 3 mLs by nebulization every 6 (six) hours as needed. 02/03/16   Hannah Beat, MD  levofloxacin (LEVAQUIN) 750 MG tablet Take 1 tablet (750 mg total) by mouth daily. 10/14/15   Emily Filbert, MD  Melatonin 5 MG TABS Take 5 mg by mouth at bedtime as needed (for sleep).     Historical Provider, MD  metoprolol (LOPRESSOR) 50 MG tablet Take 1 tablet (50 mg total) by mouth 2 (two) times daily. 04/19/15   Brittainy Sherlynn Carbon, PA-C  morphine (MS CONTIN) 30 MG 12 hr tablet Take 30 mg by mouth every 12 (twelve) hours.    Historical Provider, MD  nitroGLYCERIN (NITROSTAT) 0.4 MG SL tablet Place 0.4 mg under the tongue every 5 (five) minutes as needed for chest pain.    Historical Provider, MD  Omega-3 Fatty Acids (FISH OIL) 1000 MG CAPS Take 1,000 mg by mouth daily.     Historical Provider, MD   oxyCODONE (OXYCONTIN) 30 MG 12 hr tablet Take 30 mg by mouth at bedtime.     Historical Provider, MD  predniSONE (DELTASONE) 20 MG tablet 2 tabs po for 7 days, then 1 tab po for 7 days 09/20/15   Hannah Beat, MD  predniSONE (STERAPRED UNI-PAK 21 TAB) 10 MG (21) TBPK tablet Dispense steroid taper pack as directed 10/14/15   Emily Filbert, MD  ramipril (ALTACE) 5 MG capsule Take 2 capsules (10 mg total) by mouth at bedtime. 04/19/15   Brittainy Sherlynn Carbon, PA-C  tiotropium (SPIRIVA) 18 MCG inhalation capsule Place 1 capsule (18 mcg total) into inhaler and inhale daily. 09/20/15   Hannah Beat, MD  tiZANidine (ZANAFLEX) 4 MG tablet Take 2-4 mg by mouth at bedtime as needed for muscle spasms.    Historical Provider, MD  VITAMIN E PO Take 1 capsule by mouth daily.     Historical Provider, MD      VITAL SIGNS:  Blood pressure (!) 149/63, pulse 89, temperature 98.9 F (37.2 C), temperature source Oral, resp. rate (!) 26, height 5' (1.524 m), weight 65.8 kg (145 lb), SpO2 94 %.  PHYSICAL EXAMINATION:   VITAL SIGNS: Vitals:   02/09/16 1709 02/09/16 1730  BP: (!) 168/70 (!) 149/63  Pulse: 93 89  Resp: (!) 27 (!) 26  Temp: 98.9 F (37.2 C)    GENERAL:82 y.o.female moderate distress given mental status.  HEAD: Normocephalic, atraumatic.  EYES: Pupils equal, round, reactive to light. Unable to assess extraocular muscles given mental status/medical condition. No scleral icterus.  MOUTH: Moist mucosal membrane. Dentition intact. No abscess noted.  EAR, NOSE, THROAT: Clear without exudates. No external lesions.  NECK: Supple. No thyromegaly. No nodules. No JVD.  PULMONARY:  Greatly diminished breath sounds without wheeze  No use of accessory muscles, Good respiratory effort. good air entry bilaterally CHEST: Nontender to palpation.  CARDIOVASCULAR: S1 and S2. Regular rate and rhythm. No murmurs, rubs, or gallops. No edema. Pedal pulses 2+ bilaterally.  GASTROINTESTINAL: Soft, nontender,  nondistended. No masses. Positive bowel sounds. No hepatosplenomegaly.  MUSCULOSKELETAL: No swelling, clubbing, or edema. Range of motion full in all extremities.  NEUROLOGIC: Unable to assess given mental status/medical  condition SKIN: No ulceration, lesions, rashes, or cyanosis. Skin warm and dry. Turgor intact.  PSYCHIATRIC: lethargic, Unable to assess given mental status/medical condition     LABORATORY PANEL:   CBC  Recent Labs Lab 02/09/16 1715  WBC 12.8*  HGB 9.8*  HCT 31.1*  PLT 351   ------------------------------------------------------------------------------------------------------------------  Chemistries   Recent Labs Lab 02/09/16 1715  NA 137  K 2.9*  CL 94*  CO2 38*  GLUCOSE 279*  BUN 18  CREATININE 0.55  CALCIUM 9.0  MG 1.8   ------------------------------------------------------------------------------------------------------------------  Cardiac Enzymes  Recent Labs Lab 02/09/16 1734  TROPONINI 0.04*   ------------------------------------------------------------------------------------------------------------------  RADIOLOGY:  Dg Chest 2 View  Result Date: 02/09/2016 CLINICAL DATA:  Increased lethargy generalized weakness for 3 days. Pain in the left flank. EXAM: CHEST  2 VIEW COMPARISON:  10/14/2015 FINDINGS: Chronic elevation of the right hemidiaphragm is noted with bibasilar atelectasis. Mild vascular congestion noted without pneumonic consolidation or pneumothorax. Patient is status post CABG. Neural stimulator device projects along the dorsal aspect of the mid thoracic spine. The patient is status post bilateral shoulder arthroplasties without evidence of hardware failure. IMPRESSION: Chronically elevated right hemidiaphragm as before with bibasilar atelectasis. Mild vascular congestion without effusion in. No pneumonic consolidations. Electronically Signed   By: Tollie Eth M.D.   On: 02/09/2016 18:49   Ct Head Wo Contrast  Result  Date: 02/09/2016 CLINICAL DATA:  Lethargy an generalize weakness EXAM: CT HEAD WITHOUT CONTRAST TECHNIQUE: Contiguous axial images were obtained from the base of the skull through the vertex without intravenous contrast. COMPARISON:  None. FINDINGS: Brain: No acute territorial infarction, intracranial hemorrhage or extra-axial fluid collection. No focal mass, mass effect or midline shift. Mild atrophy. Mild periventricular white matter hypodensities, consistent with small vessel disease. Vascular: No hyperdense vessels. There are carotid artery calcifications. Skull: Mastoid air cells are clear. There is no fracture. There is a hypoplastic appearing left mandibular head. Sinuses/Orbits: Mild mucosal thickening within the maxillary and ethmoid sinuses. No acute orbital abnormality. Other: None IMPRESSION: 1. No CT evidence for acute intracranial abnormality 2. Mild atrophy and small vessel ischemic change of the periventricular white matter. Electronically Signed   By: Jasmine Pang M.D.   On: 02/09/2016 18:16    EKG:   Orders placed or performed during the hospital encounter of 02/09/16  . EKG 12-Lead  . EKG 12-Lead  . ED EKG  . ED EKG  . EKG 12-Lead  . EKG 12-Lead    IMPRESSION AND PLAN:   61 y female history of diastolic heart failure, copd oxygen requiring (2.5-3L) presenting with lethargy  1. Acute on chronic hypoxic respiratory failure: duoneb, oxygen, will add levequin for possible pneumonia, check ABG STAT concern for hypercapnic respiratory failure. May require BiPAP pending results. Check procalcitonin No wheezing, will hold on steroids  2. Hypokalemia: replace goal 4-5, replace mag 3. Acute on chronic Diastolic congestive heart failure: check BNP, lasix, will consult cardio - previously saw [facility]   All the records are reviewed and case discussed with ED provider. Management plans discussed with the patient, family and they are in agreement.  CODE STATUS: full  TOTAL TIME TAKING  CARE OF THIS PATIENT: 45 critical minutes.    Artia Singley,  Mardi Mainland.D on 02/09/2016 at 8:17 PM  Between 7am to 6pm - Pager - [phone]  After 6pm: House Pager: - [phone]  [facility] [place]  Office  [phone]  CC: Primary care physician; Hannah Beat, MD

## 2016-02-09 NOTE — Progress Notes (Signed)
   [facility] - [place] at [facility] Day: 0 days [name] is a 82 y.o. female presenting with Weakness .   Advance care planning discussed with patient  with additional Family (daughter) at bedside. All questions in regards to overall condition and expected prognosis answered. The decision was made to continue current code status  CODE STATUS: full Time spent: 18 minutes

## 2016-02-10 ENCOUNTER — Inpatient Hospital Stay (HOSPITAL_COMMUNITY)
Admit: 2016-02-10 | Discharge: 2016-02-10 | Disposition: A | Discharge status: Home / Self Care | Payer: Medicare Other | Attending: Internal Medicine | Admitting: Internal Medicine

## 2016-02-10 DIAGNOSIS — J9622 Acute and chronic respiratory failure with hypercapnia: Principal | ICD-10-CM

## 2016-02-10 DIAGNOSIS — R0689 Other abnormalities of breathing: Secondary | ICD-10-CM

## 2016-02-10 DIAGNOSIS — J9621 Acute and chronic respiratory failure with hypoxia: Secondary | ICD-10-CM

## 2016-02-10 DIAGNOSIS — R531 Weakness: Secondary | ICD-10-CM

## 2016-02-10 DIAGNOSIS — I5031 Acute diastolic (congestive) heart failure: Secondary | ICD-10-CM

## 2016-02-10 DIAGNOSIS — J449 Chronic obstructive pulmonary disease, unspecified: Secondary | ICD-10-CM

## 2016-02-10 DIAGNOSIS — I5032 Chronic diastolic (congestive) heart failure: Secondary | ICD-10-CM

## 2016-02-10 LAB — CBC
HCT: 30.9 % — ABNORMAL LOW (ref 35.0–47.0)
Hemoglobin: 10.1 g/dL — ABNORMAL LOW (ref 12.0–16.0)
MCH: 26.1 pg (ref 26.0–34.0)
MCHC: 32.6 g/dL (ref 32.0–36.0)
MCV: 80 fL (ref 80.0–100.0)
PLATELETS: 334 10*3/uL (ref 150–440)
RBC: 3.86 MIL/uL (ref 3.80–5.20)
RDW: 15.3 % — AB (ref 11.5–14.5)
WBC: 13.5 10*3/uL — AB (ref 3.6–11.0)

## 2016-02-10 LAB — BLOOD GAS, ARTERIAL
Acid-Base Excess: 18.1 mmol/L — ABNORMAL HIGH (ref 0.0–2.0)
Bicarbonate: 45.4 mmol/L — ABNORMAL HIGH (ref 20.0–28.0)
FIO2: 0.36
O2 Saturation: 95.3 %
PATIENT TEMPERATURE: 37
PH ART: 7.42 (ref 7.350–7.450)
pCO2 arterial: 70 mmHg (ref 32.0–48.0)
pO2, Arterial: 76 mmHg — ABNORMAL LOW (ref 83.0–108.0)

## 2016-02-10 LAB — BASIC METABOLIC PANEL
Anion gap: 6 (ref 5–15)
BUN: 16 mg/dL (ref 6–20)
CHLORIDE: 89 mmol/L — AB (ref 101–111)
CO2: 43 mmol/L — ABNORMAL HIGH (ref 22–32)
CREATININE: 0.62 mg/dL (ref 0.44–1.00)
Calcium: 8.8 mg/dL — ABNORMAL LOW (ref 8.9–10.3)
GFR calc Af Amer: >60 mL/min (ref >60)
GFR calc non Af Amer: >60 mL/min (ref >60)
Glucose, Bld: 157 mg/dL — ABNORMAL HIGH (ref 65–99)
Potassium: 2.9 mmol/L — ABNORMAL LOW (ref 3.5–5.1)
SODIUM: 138 mmol/L (ref 135–145)

## 2016-02-10 LAB — MAGNESIUM: MAGNESIUM: 2 mg/dL (ref 1.7–2.4)

## 2016-02-10 LAB — GLUCOSE, CAPILLARY
Glucose-Capillary: 232 mg/dL — ABNORMAL HIGH (ref 65–99)
Glucose-Capillary: 319 mg/dL — ABNORMAL HIGH (ref 65–99)

## 2016-02-10 LAB — ECHOCARDIOGRAM COMPLETE
Height: 60 in
Weight: 2196.8 oz

## 2016-02-10 MED ORDER — POTASSIUM CHLORIDE CRYS ER 20 MEQ PO TBCR
40.0000 meq | EXTENDED_RELEASE_TABLET | Freq: Once | ORAL | Status: AC
  Administered 2016-02-10: 40 meq via ORAL
  Filled 2016-02-10: qty 2

## 2016-02-10 MED ORDER — OXYCODONE HCL 5 MG PO TABS
5.0000 mg | ORAL_TABLET | ORAL | Status: DC | PRN
  Administered 2016-02-10 – 2016-02-13 (×17): 5 mg via ORAL
  Filled 2016-02-10 (×16): qty 1

## 2016-02-10 MED ORDER — POTASSIUM CHLORIDE CRYS ER 20 MEQ PO TBCR
20.0000 meq | EXTENDED_RELEASE_TABLET | Freq: Every day | ORAL | Status: DC
  Administered 2016-02-10: 20 meq via ORAL

## 2016-02-10 MED ORDER — POTASSIUM CHLORIDE CRYS ER 20 MEQ PO TBCR
40.0000 meq | EXTENDED_RELEASE_TABLET | ORAL | Status: AC
  Administered 2016-02-10 (×4): 40 meq via ORAL
  Filled 2016-02-10 (×5): qty 2

## 2016-02-10 MED ORDER — LEVOFLOXACIN 500 MG PO TABS
250.0000 mg | ORAL_TABLET | Freq: Every day | ORAL | Status: AC
  Administered 2016-02-10 – 2016-02-13 (×4): 250 mg via ORAL
  Filled 2016-02-10 (×4): qty 1

## 2016-02-10 MED ORDER — METHYLPREDNISOLONE SODIUM SUCC 125 MG IJ SOLR
60.0000 mg | Freq: Two times a day (BID) | INTRAMUSCULAR | Status: DC
  Administered 2016-02-10 – 2016-02-11 (×4): 60 mg via INTRAVENOUS
  Filled 2016-02-10 (×4): qty 2

## 2016-02-10 MED ORDER — INSULIN ASPART 100 UNIT/ML ~~LOC~~ SOLN
0.0000 [IU] | Freq: Three times a day (TID) | SUBCUTANEOUS | Status: DC
  Administered 2016-02-11: 5 [IU] via SUBCUTANEOUS
  Administered 2016-02-11: 9 [IU] via SUBCUTANEOUS
  Administered 2016-02-11 – 2016-02-12 (×3): 7 [IU] via SUBCUTANEOUS
  Administered 2016-02-12: 5 [IU] via SUBCUTANEOUS
  Administered 2016-02-13 (×2): 2 [IU] via SUBCUTANEOUS
  Filled 2016-02-10: qty 3
  Filled 2016-02-10: qty 9
  Filled 2016-02-10: qty 7
  Filled 2016-02-10 (×2): qty 2
  Filled 2016-02-10: qty 3
  Filled 2016-02-10: qty 5
  Filled 2016-02-10 (×2): qty 7

## 2016-02-10 MED ORDER — INSULIN ASPART 100 UNIT/ML ~~LOC~~ SOLN
0.0000 [IU] | Freq: Every day | SUBCUTANEOUS | Status: DC
  Administered 2016-02-10: 4 [IU] via SUBCUTANEOUS
  Administered 2016-02-11: 5 [IU] via SUBCUTANEOUS
  Administered 2016-02-12: 3 [IU] via SUBCUTANEOUS
  Filled 2016-02-10: qty 5
  Filled 2016-02-10: qty 3
  Filled 2016-02-10: qty 4

## 2016-02-10 MED ORDER — DIPHENHYDRAMINE HCL 25 MG PO CAPS
25.0000 mg | ORAL_CAPSULE | Freq: Every evening | ORAL | Status: DC | PRN
  Administered 2016-02-11 – 2016-02-12 (×2): 25 mg via ORAL
  Filled 2016-02-10 (×2): qty 1

## 2016-02-10 MED ORDER — DIPHENHYDRAMINE HCL 25 MG PO CAPS
25.0000 mg | ORAL_CAPSULE | Freq: Once | ORAL | Status: AC
  Administered 2016-02-10: 25 mg via ORAL
  Filled 2016-02-10: qty 1

## 2016-02-10 MED ORDER — FENTANYL 25 MCG/HR TD PT72
25.0000 ug | MEDICATED_PATCH | TRANSDERMAL | Status: DC
  Administered 2016-02-10: 25 ug via TRANSDERMAL
  Filled 2016-02-10: qty 1

## 2016-02-10 NOTE — Care Management (Addendum)
spoke with patient's daughter Gloris Manchester.  Patient does live with her in [place].  Patient has a home in [place].  [name] took patient to [facility] to live with her during the spring of 2017 because patient was not able to stay alone. Patient was receiving home health nursing and physical therapy had just discharged her from service. Both are in town due to the death of a family member. Patient was in a rapid decline during this trip so came to the ED.  Patient usually is able to stay by herself during the day but at present time she would not be able .  She turns her 02 liter flow up at home which causes a problem because she is a retainer.  Her PCO2 was 70.  She does have a home nebulizer. [name] is in agreement with skilled nursing placement

## 2016-02-10 NOTE — Progress Notes (Addendum)
[facility] - [place] at [facility]                                                                                                                                                                                            Patient Demographics   [name], is a 82 y.o. female, DOB - [birth year], TDD:[id number]  Admit date - 02/09/2016   Admitting Physician Wyatt Haste, MD  Outpatient Primary MD for the patient is Hannah Beat, MD   LOS - 1  Subjective: Ration states that she is very weak, complains of shortness of breath, dry cough and pressure-like feeling in the chest    Review of Systems:   CONSTITUTIONAL: No documented fever. No fatigue, weakness. No weight gain, no weight loss.  EYES: No blurry or double vision.  ENT: No tinnitus. No postnasal drip. No redness of the oropharynx.  RESPIRATORY:Positive cough, no wheeze, no hemoptysis. Positive dyspnea.  CARDIOVASCULAR: No chest pain. No orthopnea. No palpitations. No syncope.  GASTROINTESTINAL: No nausea, no vomiting or diarrhea. No abdominal pain. No melena or hematochezia.  GENITOURINARY: No dysuria or hematuria.  ENDOCRINE: No polyuria or nocturia. No heat or cold intolerance.  HEMATOLOGY: No anemia. No bruising. No bleeding.  INTEGUMENTARY: No rashes. No lesions.  MUSCULOSKELETAL: No arthritis. No swelling. No gout.  NEUROLOGIC: No numbness, tingling, or ataxia. No seizure-type activity.  PSYCHIATRIC: No anxiety. No insomnia. No ADD.    Vitals:   Vitals:   02/09/16 2128 02/10/16 0415 02/10/16 0956 02/10/16 1153  BP: (!) 143/62 (!) 145/56 (!) 144/71 (!) 173/66  Pulse: 77 71 84 73  Resp: 18 18  14   Temp: 98.2 F (36.8 C) 98 F (36.7 C)  97.6 F (36.4 C)  TempSrc: Oral Oral  Oral  SpO2: 94% 91% 93% 91%  Weight: 141 lb 6.4 oz (64.1 kg) 137 lb 4.8 oz (62.3 kg)    Height:        Wt Readings from Last 3 Encounters:  02/10/16 137 lb 4.8 oz (62.3 kg)  10/14/15 147 lb (66.7 kg)   09/27/15 136 lb (61.7 kg)     Intake/Output Summary (Last 24 hours) at 02/10/16 1201 Last data filed at 02/10/16 0900  Gross per 24 hour  Intake              290 ml  Output              600 ml  Net             -310 ml    Physical Exam:   GENERAL: Pleasant-appearing in no apparent distress.  HEAD,  EYES, EARS, NOSE AND THROAT: Atraumatic, normocephalic. Extraocular muscles are intact. Pupils equal and reactive to light. Sclerae anicteric. No conjunctival injection. No oro-pharyngeal erythema.  NECK: Supple. There is no jugular venous distention. No bruits, no lymphadenopathy, no thyromegaly.  HEART: Regular rate and rhythm,. No murmurs, no rubs, no clicks.  LUNGS: Bilateral wheezing throughout both lungs no Accesory muscle usage ABDOMEN: Soft, flat, nontender, nondistended. Has good bowel sounds. No hepatosplenomegaly appreciated.  EXTREMITIES: No evidence of any cyanosis, clubbing, or peripheral edema.  +2 pedal and radial pulses bilaterally.  NEUROLOGIC: The patient is alert, awake, and oriented x3 with no focal motor or sensory deficits appreciated bilaterally.  SKIN: Moist and warm with no rashes appreciated.  Psych: Not anxious, depressed LN: No inguinal LN enlargement    Antibiotics   Anti-infectives    Start     Dose/Rate Route Frequency Ordered Stop   02/10/16 1800  Levofloxacin (LEVAQUIN) IVPB 250 mg     250 mg 50 mL/hr over 60 Minutes Intravenous Every 24 hours 02/09/16 2123     02/09/16 2130  Levofloxacin (LEVAQUIN) IVPB 250 mg     250 mg 50 mL/hr over 60 Minutes Intravenous  Once 02/09/16 2128 02/09/16 2347   02/09/16 1945  ciprofloxacin (CIPRO) IVPB 400 mg  Status:  Discontinued     400 mg 200 mL/hr over 60 Minutes Intravenous  Once 02/09/16 1932 02/09/16 1933   02/09/16 1945  Levofloxacin (LEVAQUIN) IVPB 250 mg     250 mg 50 mL/hr over 60 Minutes Intravenous  Once 02/09/16 1933 02/09/16 2128      Medications   Scheduled Meds: . acidophilus  1 capsule  Oral Daily  . amLODipine  5 mg Oral Daily  . aspirin EC  81 mg Oral Daily  . atorvastatin  40 mg Oral q1800  . enoxaparin (LOVENOX) injection  40 mg Subcutaneous Q24H  . furosemide  20 mg Oral Daily  . levofloxacin (LEVAQUIN) IV  250 mg Intravenous Q24H  . metoprolol  50 mg Oral BID  . mometasone-formoterol  2 puff Inhalation BID  . potassium chloride  20 mEq Oral Daily  . potassium chloride  40 mEq Oral Q4H  . ramipril  10 mg Oral QHS  . tiotropium  18 mcg Inhalation Daily   Continuous Infusions: PRN Meds:.acetaminophen **OR** acetaminophen, docusate sodium, ipratropium-albuterol, ondansetron **OR** ondansetron (ZOFRAN) IV, oxyCODONE   Data Review:   Micro Results Recent Results (from the past 240 hour(s))  Culture, blood (routine x 2)     Status: None (Preliminary result)   Collection Time: 02/09/16  7:56 PM  Result Value Ref Range Status   Specimen Description BLOOD RIGHT ARM  Final   Special Requests BOTTLES DRAWN AEROBIC AND ANAEROBIC 5CC  Final   Culture NO GROWTH < 12 HOURS  Final   Report Status PENDING  Incomplete  Culture, blood (routine x 2)     Status: None (Preliminary result)   Collection Time: 02/09/16  8:26 PM  Result Value Ref Range Status   Specimen Description BLOOD RIGHT ARM  Final   Special Requests BOTTLES DRAWN AEROBIC AND ANAEROBIC 5CC  Final   Culture NO GROWTH < 12 HOURS  Final   Report Status PENDING  Incomplete    Radiology Reports Dg Chest 2 View  Result Date: 02/09/2016 CLINICAL DATA:  Increased lethargy generalized weakness for 3 days. Pain in the left flank. EXAM: CHEST  2 VIEW COMPARISON:  10/14/2015 FINDINGS: Chronic elevation of the right hemidiaphragm is noted with bibasilar  atelectasis. Mild vascular congestion noted without pneumonic consolidation or pneumothorax. Patient is status post CABG. Neural stimulator device projects along the dorsal aspect of the mid thoracic spine. The patient is status post bilateral shoulder arthroplasties  without evidence of hardware failure. IMPRESSION: Chronically elevated right hemidiaphragm as before with bibasilar atelectasis. Mild vascular congestion without effusion in. No pneumonic consolidations. Electronically Signed   By: Tollie Eth M.D.   On: 02/09/2016 18:49   Ct Head Wo Contrast  Result Date: 02/09/2016 CLINICAL DATA:  Lethargy an generalize weakness EXAM: CT HEAD WITHOUT CONTRAST TECHNIQUE: Contiguous axial images were obtained from the base of the skull through the vertex without intravenous contrast. COMPARISON:  None. FINDINGS: Brain: No acute territorial infarction, intracranial hemorrhage or extra-axial fluid collection. No focal mass, mass effect or midline shift. Mild atrophy. Mild periventricular white matter hypodensities, consistent with small vessel disease. Vascular: No hyperdense vessels. There are carotid artery calcifications. Skull: Mastoid air cells are clear. There is no fracture. There is a hypoplastic appearing left mandibular head. Sinuses/Orbits: Mild mucosal thickening within the maxillary and ethmoid sinuses. No acute orbital abnormality. Other: None IMPRESSION: 1. No CT evidence for acute intracranial abnormality 2. Mild atrophy and small vessel ischemic change of the periventricular white matter. Electronically Signed   By: Jasmine Pang M.D.   On: 02/09/2016 18:16     CBC  Recent Labs Lab 02/09/16 1715 02/10/16 0434  WBC 12.8* 13.5*  HGB 9.8* 10.1*  HCT 31.1* 30.9*  PLT 351 334  MCV 80.5 80.0  MCH 25.4* 26.1  MCHC 31.5* 32.6  RDW 15.3* 15.3*    Chemistries   Recent Labs Lab 02/09/16 1715 02/10/16 0434  NA 137 138  K 2.9* 2.9*  CL 94* 89*  CO2 38* 43*  GLUCOSE 279* 157*  BUN 18 16  CREATININE 0.55 0.62  CALCIUM 9.0 8.8*  MG 1.8 2.0   ------------------------------------------------------------------------------------------------------------------ estimated creatinine clearance is 44.7 mL/min (by C-G formula based on SCr of 0.62  mg/dL). ------------------------------------------------------------------------------------------------------------------ No results for input(s): HGBA1C in the last 72 hours. ------------------------------------------------------------------------------------------------------------------ No results for input(s): CHOL, HDL, LDLCALC, TRIG, CHOLHDL, LDLDIRECT in the last 72 hours. ------------------------------------------------------------------------------------------------------------------ No results for input(s): TSH, T4TOTAL, T3FREE, THYROIDAB in the last 72 hours.  Invalid input(s): FREET3 ------------------------------------------------------------------------------------------------------------------ No results for input(s): VITAMINB12, FOLATE, FERRITIN, TIBC, IRON, RETICCTPCT in the last 72 hours.  Coagulation profile No results for input(s): INR, PROTIME in the last 168 hours.  No results for input(s): DDIMER in the last 72 hours.  Cardiac Enzymes  Recent Labs Lab 02/09/16 1734  TROPONINI 0.04*   ------------------------------------------------------------------------------------------------------------------ Invalid input(s): POCBNP    Assessment & Plan   50 y female history of diastolic heart failure, copd oxygen requiring (2.5-3L) presenting with lethargy  1. Acute on chronic hypoxic respiratory failure:  This is due to acute on chronic COPD exasperation, as well as acute on chronic diastolic CHF Continue oxygen therapy, continue nebulizer therapy, continue Levaquin for acute bronchitis Start patient on IV Solu-Medrol 2. Hypokalemia: Still very low I will give potassium orally  3. Acute on chronic Diastolic congestive heart failure:Continue therapy with Lasix 4. Hyperlipidemia unspecified continue atorvastatin 5. Essential hypertension continue amlodipine , ramipril and Lopressor 6. Miscellaneous Lovenox for DVT prophylaxis       Code Status Orders         Start     Ordered   02/09/16 2013  Full code  Continuous     02/09/16 2012    Code Status History  Date Active Date Inactive Code Status Order ID Comments User Context   02/09/2016  7:45 PM 02/09/2016  8:12 PM DNR [id number]  Wyatt Haste, MD ED   04/20/2015  9:14 PM 04/22/2015  6:32 PM DNR [id number]  Alford Highland, MD ED   04/05/2015 10:42 PM 04/07/2015  4:50 PM Full Code [id number]  Wyatt Haste, MD ED   01/26/2012  3:04 PM 01/29/2012  7:25 PM Full Code [id number]  Tacey Ruiz, RN Inpatient   01/22/2012  1:10 PM 01/26/2012  3:04 PM Full Code [id number]  Maggie Schwalbe, RN Inpatient           Consults  None DVT Prophylaxis  Lovenox -  Lab Results  Component Value Date   PLT 334 02/10/2016     Time Spent in minutes   Greater than 50% of time spent in care coordination and counseling patient regarding the condition and plan of care.   Auburn Bilberry M.D on 02/10/2016 at 12:01 PM  Between 7am to 6pm - Pager - [phone]  After 6pm go to [email or web address] - password [email or web address] [facility]  [facility] [place]   Office  [phone]

## 2016-02-10 NOTE — Evaluation (Signed)
Physical Therapy Evaluation Patient Details Name: [name] MRN: [id number] DOB: [birth year] Today's Date: 02/10/2016   History of Present Illness  Pt admitted with complaints of weakness. Pt with history of CRF.   Clinical Impression  Pt is a pleasant 82 year old female who was admitted for acute on chronic respiratory failure. Pt performs bed mobility with min assist and able to sit at EOB. Once at EOB, pt becomes very fatigued with decreased O2 sats (86%, cued for pursed lip breathing), requesting to return back supine. Unable to perform further mobility this session secondary to cadiopulm deficits. Pt demonstrates deficits with endurance/strength/mobility. Pt is questionable historian, however reports she was very active at baseline as she lived alone, later said she lived with daughter. Pt appears not to be at baseline level at this time. Would benefit from skilled PT to address above deficits and promote optimal return to PLOF; recommend transition to STR upon discharge from acute hospitalization.       Follow Up Recommendations SNF    Equipment Recommendations       Recommendations for Other Services       Precautions / Restrictions Precautions Precautions: Fall Restrictions Weight Bearing Restrictions: No      Mobility  Bed Mobility Overal bed mobility: Needs Assistance Bed Mobility: Supine to Sit     Supine to sit: Min assist     General bed mobility comments: assist for reaching for railing and cues for trunk support. Once seated, pt with kyphotic posture. O2 used for all mobility, however O2 sats decreased to 86% with exertion. Unable to perform further mobility this date.  Transfers                 General transfer comment: unable at this time  Ambulation/Gait                Stairs            Wheelchair Mobility    Modified Rankin (Stroke Patients Only)       Balance Overall balance assessment: Needs  assistance Sitting-balance support: Feet unsupported;Bilateral upper extremity supported Sitting balance-Leahy Scale: Fair                                       Pertinent Vitals/Pain Pain Assessment: No/denies pain    Home Living Family/patient expects to be discharged to:: Private residence Living Arrangements: Children (daughter) Available Help at Discharge: Family Type of Home: House Home Access: Stairs to enter Entrance Stairs-Rails: Can reach both Entrance Stairs-Number of Steps: 1 Home Layout: One level Home Equipment: Environmental consultant - 2 wheels;Cane - single point      Prior Function Level of Independence: Independent with assistive device(s)         Comments: pt reports she uses SPC and RW equally, pt is poor historian, unsure of accuracy     Hand Dominance        Extremity/Trunk Assessment   Upper Extremity Assessment: Generalized weakness (B UE grossly 4/5)           Lower Extremity Assessment: Generalized weakness (B LE grossly 3+/5)         Communication   Communication: No difficulties  Cognition Arousal/Alertness: Lethargic Behavior During Therapy: WFL for tasks assessed/performed Overall Cognitive Status: Difficult to assess                      General  Comments      Exercises Other Exercises Other Exercises: Supine ther-ex performed on B LE including SLRs, quad sets, and hip abd/add. All ther-ex performed x 10 reps with min assist and cues for correct technique   Assessment/Plan    PT Assessment Patient needs continued PT services  PT Problem List Decreased strength;Decreased activity tolerance;Decreased balance;Decreased mobility;Cardiopulmonary status limiting activity          PT Treatment Interventions Gait training;DME instruction;Therapeutic activities;Therapeutic exercise    PT Goals (Current goals can be found in the Care Plan section)  Acute Rehab PT Goals Patient Stated Goal: to help her hip not  hurt PT Goal Formulation: With patient Time For Goal Achievement: 02/24/16 Potential to Achieve Goals: Good    Frequency Min 2X/week   Barriers to discharge        Co-evaluation               End of Session Equipment Utilized During Treatment: Oxygen Activity Tolerance: Treatment limited secondary to medical complications (Comment) Patient left: in bed;with bed alarm set Nurse Communication: Mobility status         Time: 6440-3474 PT Time Calculation (min) (ACUTE ONLY): 16 min   Charges:   PT Evaluation $PT Eval Moderate Complexity: 1 Procedure PT Treatments $Therapeutic Exercise: 8-22 mins   PT G Codes:        Shirelle Tootle 20-Feb-2016, 4:43 PM  Elizabeth Palau, PT, DPT [phone]

## 2016-02-10 NOTE — Progress Notes (Signed)
Placed bipap setup in patient's room. Patient was ready to start therapy. Placed patient on bipap for appx 2 minutes and then patient wanted to take off mask. Explained that I felt is was too early due to RN needing to access and give her meds. Told her will try again later

## 2016-02-10 NOTE — Clinical Social Work Note (Signed)
MSW received consult that patient will need SNF for short term rehab.  MSW to meet with patient at a later time to discuss SNF placement options and process.  Ervin Knack. Hassan Rowan, MSW [phone]  Mon-Fri 8a-4:30p 02/10/2016 5:29 PM

## 2016-02-10 NOTE — Consult Note (Signed)
Cardiology Consultation Note  Patient ID: [name], MRN: [id number], DOB/AGE: 1931/04/03 82 y.o. Admit date: 02/09/2016   Date of Consult: 02/10/2016 Primary Physician: Hannah Beat, MD Primary Cardiologist: Marca Ancona  Chief Complaint: Weakness Reason for Consult: Concern for CHF Physician requesting consult: Dr. Clint Guy   HPI: 82 y.o. female with history of COPD, HTN, diabetes, and CAD presents for weakness, shortness of breath, failure to thrive . Last seen in clinic by cardiology in June 2016 [name], last seen by Dr. Shirlee Latch in 2015 (he had recommended follow-up in 4 months after that visit)   Patient is a poor historian but reports feeling weak, has ankle swelling Notes reviewed indicating daughter had indicated 3 days of increasing "weakness", sleeping more, lethargic. She was brought to the emergency room, noted to be hypoxic ABG with CO2 of 70, O2 76 on nasal cannula  Lab work reviewed showing potassium 2.9, normal renal function, hematocrit 30.9  Patient reports having slight worsening of her ankle edema, Better this morning with her legs elevated She is laying on her side, soft-spoken, reports that her breathing is "so-so"  Other past medical history reviewed In the hospital with chest pain in 8/12.  Left heart cath showed severe, diffuse disease in a small RCA that was not amenable to intervention.  This was the likely culprit vessel.  She also had a 70% LAD stenosis.    [name] was then done to assess for ischemia in the LAD distribution.   showed only a fixed apical perfusion defect with no ischemia (low risk).   admitted to [facility] in 1/13 with a lower GI bleed that was thought to be diverticular.   She required blood transfusion.   LHC for chest pains in 10/13, and she ended up having CABG x 4.   readmitted 11/13 with acute on chronic diastolic CHF and an an acute exacerbation of COPD.   She was diuresed and treated with antibiotics.   She  has had recurrent lower GI bleeds from diverticulosis.  No recent rectal bleeding.    06/2015: Echo was normal, EF >55%  She has had no chest pain. She has stable exertional dyspnea with stairs or heavier housework.  Main limitation continues to be low back and hip pain.  She is using oxycodone.  She is using oxygen at night.  She smokes a cigarette occasionally.  She is not taking aspirin and is not sure why. She ran out of her atorvastatin and never refilled it.  She did not have side effects from it.  She is also off bisoprolol and is not sure why.    PMH: 1. COPD 2. HTN 3. Diabetes mellitus type II 4. ? AAA: Patient says she was told by [name] that she has an aneurysm. Abdominal US (10/12) showed no AAA.  5. Chronic low back pain 6. Osteoarthritis: hip pain 7. Anemia  8. Excision of adrenal mass in 2007 9. Restless leg syndrome. 10. Bilateral shoulder replacements.  11. CAD: Cath in 2004 with moderate nonobstructive disease.  Patient was admitted to [facility] in 8/12 with NSTEMI.  LHC (8/12) showed severe diffuse disease of a small RCA.  This was the culprit lesion but it was too small and diffusely diseased for intervention.  There was a 70% proximal LAD stenosis.  Lexiscan myoview (9/12) to assess for LAD territory ischemia showed a small fixed apical defect, low risk.  EF 65% by myoview. CABG 10/13 with LIMA-LAD, SVG-D, SVG-PLOM, and SVG-RCA.  Echo (  11/13): EF 55-60%, grade I diastolic dysfunction, PA systolic pressure 38 mmHg.  12. Diverticular bleed 1/13 requiring microembolization by vascular surgery.  Recurrent diverticular bleeding in 5/14.  13. ABIs (2/13) within normal limits.  14. Chronic diastolic CHF: Last echo (5/14) with EF 55-60%, mild LVH.   SH: Quit smoking in 9/12 after NSTEMI.  Restarted to quit again in 5/14.  Lives alone in [place]. 4 children.    FH: Father with MI/CHF, died in his 2s.      Past Medical History:  Diagnosis Date  . Adrenal mass  (HCC)    a. Excision 2007.  [name] Anemia, deficiency 07/01/2004   (w/u by Dr. Lorre Nick)  . Anxiety and depression   . Arteriovenous malformation of gastrointestinal tract   . C. difficile colitis    a. 04/2008 = severe.  [name] CAD (coronary artery disease) 09/01/2002   a. s/p NSTEMI 2012. b. s/p CABG 01/2012.  [name] CHF (congestive heart failure) (HCC)   . Chronic back pain   . Chronic back pain greater than 3 months duration 11/30/2014  . COPD (chronic obstructive pulmonary disease) (HCC) 05/31/2004  . Diabetes mellitus type II 07/31/1997  . diverticulosis of colon   . Fatty liver   . GI bleed    a. 04/2011 felt diverticular requiring microembolization by vascular.  [name] Hx of CABG 04/18/2012  . Hyperlipemia 07/02/1999  . Hypertension 08/01/1995  . Osteoarthritis 04/03/1983  . Osteoporosis 12/31/2000  . PAF (paroxysmal atrial fibrillation) (HCC) 12/16/2013  . Restless leg syndrome   . Rheumatoid arthritis(714.0) 04/03/1983  . Splenic artery aneurysm (HCC)   . Status post replacement of both shoulder joints 02/09/2014      Most Recent Cardiac Studies: Echocardiogram pending    Surgical History:  Past Surgical History:  Procedure Laterality Date  . ANKLE SURGERY     neuroma repair, right ORIF  . CARPAL TUNNEL RELEASE     bilat  . COLONIC EMBOLIZATION  04/18/2011   Dr. Wyn Quaker, acute GI bleed  . CORONARY ARTERY BYPASS GRAFT  01/22/2012   Procedure: CORONARY ARTERY BYPASS GRAFTING (CABG);  Surgeon: Delight Ovens, MD;  Location: [facility] OR;  Service: Open Heart Surgery;  Laterality: N/A;  Coronary Artery Bypass Graft times four utilizing the left internal mammary artery and the left greater saphenous vein harvested endoscopically.  [name] EPIDURAL BLOCK INJECTION  05/2005   x 3  . INGUINAL HERNIA REPAIR    . LAMINECTOMY  06/2006   L4/5 spinal stenosis (Dr. [name])  . LAMINECTOMY  02/24/2008   L3/4 Ant/Lat interbody fusion and Lat Artthrodesis w/plate using XLIF (Dr. [name])  . long finger tendon realignment   10/31/2005   [place]  . lumbar epidural  06/13/2006  . NASAL SINUS SURGERY    . PARTIAL HYSTERECTOMY     ovaries intact, dysmennorhea w/ A/P repari  . ROTATOR CUFF REPAIR  11/22/2003   ([facility]) left  . SPINAL CORD STIMULATOR IMPLANT    . TEE WITHOUT CARDIOVERSION  01/22/2012   Procedure: TRANSESOPHAGEAL ECHOCARDIOGRAM (TEE);  Surgeon: Delight Ovens, MD;  Location: [facility] OR;  Service: Open Heart Surgery;  Laterality: N/A;  . TOTAL SHOULDER REPLACEMENT  12/13/1998   right, ([facility])  . TRIGGER FINGER RELEASE     right  . WRIST SURGERY     neuroma repair, right     Home Meds: Prior to Admission medications   Medication Sig Start Date End Date Taking? Authorizing Provider  acidophilus (RISAQUAD) CAPS capsule Take 1 capsule by mouth daily.  Historical Provider, MD  albuterol (PROVENTIL HFA;VENTOLIN HFA) 108 (90 Base) MCG/ACT inhaler Inhale 2 puffs into the lungs every 6 (six) hours as needed for wheezing or shortness of breath.    Historical Provider, MD  albuterol (PROVENTIL) (2.5 MG/3ML) 0.083% nebulizer solution Take 2.5 mg by nebulization every 4 (four) hours as needed for wheezing or shortness of breath.    Historical Provider, MD  ALPRAZolam Prudy Feeler) 0.25 MG tablet TAKE 1 TABLET BY MOUTH TWICE DAILY 02/02/16   Hannah Beat, MD  amLODipine (NORVASC) 5 MG tablet TAKE 1 TABLET(5 MG) BY MOUTH DAILY 11/21/15   Brittainy Sherlynn Carbon, PA-C  aspirin EC 81 MG tablet Take 1 tablet (81 mg total) by mouth daily. 08/13/13   [name], MD  atorvastatin (LIPITOR) 40 MG tablet TAKE 1 TABLET(40 MG) BY MOUTH DAILY 11/07/15   Brittainy Sherlynn Carbon, PA-C  cefdinir (OMNICEF) 300 MG capsule Take 2 capsules (600 mg total) by mouth daily. 09/20/15   Hannah Beat, MD  docusate sodium (COLACE) 100 MG capsule Take 100 mg by mouth 2 (two) times daily as needed for mild constipation.    Historical Provider, MD  Fluticasone-Salmeterol (ADVAIR DISKUS) 500-50 MCG/DOSE AEPB Inhale 1 puff into the lungs 2  (two) times daily. 09/20/15   Hannah Beat, MD  furosemide (LASIX) 20 MG tablet Take 1 tablet (20 mg total) by mouth daily. 10/14/15 10/13/16  Emily Filbert, MD  ipratropium (ATROVENT) 0.02 % nebulizer solution Take 2.5 mLs (0.5 mg total) by nebulization every 6 (six) hours as needed for wheezing or shortness of breath. 08/10/15   Spencer Copland, MD  ipratropium-albuterol (DUONEB) 0.5-2.5 (3) MG/3ML SOLN Take 3 mLs by nebulization every 6 (six) hours as needed. 02/03/16   Hannah Beat, MD  levofloxacin (LEVAQUIN) 750 MG tablet Take 1 tablet (750 mg total) by mouth daily. 10/14/15   Emily Filbert, MD  Melatonin 5 MG TABS Take 5 mg by mouth at bedtime as needed (for sleep).     Historical Provider, MD  metoprolol (LOPRESSOR) 50 MG tablet Take 1 tablet (50 mg total) by mouth 2 (two) times daily. 04/19/15   Brittainy Sherlynn Carbon, PA-C  morphine (MS CONTIN) 30 MG 12 hr tablet Take 30 mg by mouth every 12 (twelve) hours.    Historical Provider, MD  nitroGLYCERIN (NITROSTAT) 0.4 MG SL tablet Place 0.4 mg under the tongue every 5 (five) minutes as needed for chest pain.    Historical Provider, MD  Omega-3 Fatty Acids (FISH OIL) 1000 MG CAPS Take 1,000 mg by mouth daily.     Historical Provider, MD  oxyCODONE (OXYCONTIN) 30 MG 12 hr tablet Take 30 mg by mouth at bedtime.     Historical Provider, MD  predniSONE (DELTASONE) 20 MG tablet 2 tabs po for 7 days, then 1 tab po for 7 days 09/20/15   Hannah Beat, MD  predniSONE (STERAPRED UNI-PAK 21 TAB) 10 MG (21) TBPK tablet Dispense steroid taper pack as directed 10/14/15   Emily Filbert, MD  ramipril (ALTACE) 5 MG capsule Take 2 capsules (10 mg total) by mouth at bedtime. 04/19/15   Brittainy Sherlynn Carbon, PA-C  tiotropium (SPIRIVA) 18 MCG inhalation capsule Place 1 capsule (18 mcg total) into inhaler and inhale daily. 09/20/15   Hannah Beat, MD  tiZANidine (ZANAFLEX) 4 MG tablet Take 2-4 mg by mouth at bedtime as needed for muscle spasms.     Historical Provider, MD  VITAMIN E PO Take 1 capsule by mouth daily.  Historical Provider, MD    Inpatient Medications:  . acidophilus  1 capsule Oral Daily  . amLODipine  5 mg Oral Daily  . aspirin EC  81 mg Oral Daily  . atorvastatin  40 mg Oral q1800  . enoxaparin (LOVENOX) injection  40 mg Subcutaneous Q24H  . furosemide  20 mg Oral Daily  . levofloxacin (LEVAQUIN) IV  250 mg Intravenous Q24H  . metoprolol  50 mg Oral BID  . mometasone-formoterol  2 puff Inhalation BID  . potassium chloride  20 mEq Oral Daily  . potassium chloride  40 mEq Oral Q4H  . ramipril  10 mg Oral QHS  . tiotropium  18 mcg Inhalation Daily     Allergies:  Allergies  Allergen Reactions  . Other Diarrhea, Rash and Other (See Comments)    Pt states that she is allergic to all -mycins.   Reaction: Sore tongue, raw mouth, and weakness  . Penicillins Other (See Comments)    Reaction:  Dizziness  Has patient had a PCN reaction causing immediate rash, facial/tongue/throat swelling, SOB or lightheadedness with hypotension: No Has patient had a PCN reaction causing severe rash involving mucus membranes or skin necrosis: No Has patient had a PCN reaction that required hospitalization No Has patient had a PCN reaction occurring within the last 10 years: No If all of the above answers are "NO", then may proceed with Cephalosporin use.  . Sulfa Antibiotics Shortness Of Breath  . Morphine Sulfate Nausea And Vomiting and Other (See Comments)    Reaction:  Hallucinations   . Macrolides And Ketolides Diarrhea and Nausea And Vomiting  . Iron Diarrhea  . Naproxen Rash    Social History   Social History  . Marital status: Widowed    Spouse name: N/A  . Number of children: 4  . Years of education: N/A   Occupational History  . [employer] Retired    9 hour days-6 days/week   Social History Main Topics  . Smoking status: Former Smoker    Packs/day: 1.00    Years: 50.00    Types: Cigarettes    Quit date:  12/18/2014  . Smokeless tobacco: Former NeurosurgeonUser  . Alcohol use 0.0 oz/week     Comment: red wine occassionally  . Drug use: No  . Sexual activity: No   Other Topics Concern  . Not on file   Social History Narrative   Married, widow [id number]           Family History  Problem Relation Age of Onset  . Hypotension Mother   . Dementia Mother   . Alcohol abuse Father   . Heart disease Father     MI, enlarged heart  . Diabetes Brother      Review of Systems: Review of Systems  Constitutional: Positive for malaise/fatigue.  Respiratory: Positive for shortness of breath.   Cardiovascular: Positive for leg swelling.  Gastrointestinal: Negative.   Musculoskeletal: Negative.        Gait instability  Neurological: Positive for weakness.  Psychiatric/Behavioral: Negative.   All other systems reviewed and are negative.  All other systems were reviewed and negative.   Labs: CBC  Recent Labs  02/09/16 1715 02/10/16 0434  WBC 12.8* 13.5*  HGB 9.8* 10.1*  HCT 31.1* 30.9*  MCV 80.5 80.0  PLT 351 334   Basic Metabolic Panel  Recent Labs  02/09/16 1715 02/10/16 0434  NA 137 138  K 2.9* 2.9*  CL 94* 89*  CO2 38* 43*  GLUCOSE  279* 157*  BUN 18 16  CREATININE 0.55 0.62  CALCIUM 9.0 8.8*  MG 1.8 2.0   Liver Function Tests No results for input(s): AST, ALT, ALKPHOS, BILITOT, PROT, ALBUMIN in the last 72 hours. No results for input(s): LIPASE, AMYLASE in the last 72 hours. Cardiac Enzymes  Recent Labs  02/09/16 1734  TROPONINI 0.04*   BNP Invalid input(s): POCBNP D-Dimer No results for input(s): DDIMER in the last 72 hours. Hemoglobin A1C No results for input(s): HGBA1C in the last 72 hours. Fasting Lipid Panel No results for input(s): CHOL, HDL, LDLCALC, TRIG, CHOLHDL, LDLDIRECT in the last 72 hours. Thyroid Function Tests No results for input(s): TSH, T4TOTAL, T3FREE, THYROIDAB in the last 72 hours.  Invalid input(s): FREET3  Radiology/Studies:  Dg Chest 2  View  Result Date: 02/09/2016 CLINICAL DATA:  Increased lethargy generalized weakness for 3 days. Pain in the left flank. EXAM: CHEST  2 VIEW COMPARISON:  10/14/2015 FINDINGS: Chronic elevation of the right hemidiaphragm is noted with bibasilar atelectasis. Mild vascular congestion noted without pneumonic consolidation or pneumothorax. Patient is status post CABG. Neural stimulator device projects along the dorsal aspect of the mid thoracic spine. The patient is status post bilateral shoulder arthroplasties without evidence of hardware failure. IMPRESSION: Chronically elevated right hemidiaphragm as before with bibasilar atelectasis. Mild vascular congestion without effusion in. No pneumonic consolidations. Electronically Signed   By: Tollie Eth M.D.   On: 02/09/2016 18:49   Ct Head Wo Contrast  Result Date: 02/09/2016 CLINICAL DATA:  Lethargy an generalize weakness EXAM: CT HEAD WITHOUT CONTRAST TECHNIQUE: Contiguous axial images were obtained from the base of the skull through the vertex without intravenous contrast. COMPARISON:  None. FINDINGS: Brain: No acute territorial infarction, intracranial hemorrhage or extra-axial fluid collection. No focal mass, mass effect or midline shift. Mild atrophy. Mild periventricular white matter hypodensities, consistent with small vessel disease. Vascular: No hyperdense vessels. There are carotid artery calcifications. Skull: Mastoid air cells are clear. There is no fracture. There is a hypoplastic appearing left mandibular head. Sinuses/Orbits: Mild mucosal thickening within the maxillary and ethmoid sinuses. No acute orbital abnormality. Other: None IMPRESSION: 1. No CT evidence for acute intracranial abnormality 2. Mild atrophy and small vessel ischemic change of the periventricular white matter. Electronically Signed   By: Jasmine Pang M.D.   On: 02/09/2016 18:16    EKG: Normal sinus rhythm with rate 89 bpm, APCs, or R-wave progression through the precordial  leads V1 through V3 unable to exclude old anterior MI  EKG lab work, chest x-ray, echocardiogram reviewed independently by myself  Weights: Filed Weights   02/09/16 1708 02/09/16 2128 02/10/16 0415  Weight: 145 lb (65.8 kg) 141 lb 6.4 oz (64.1 kg) 137 lb 4.8 oz (62.3 kg)     Physical Exam:Telemetry showing normal sinus rhythm Blood pressure (!) 145/56, pulse 71, temperature 98 F (36.7 C), temperature source Oral, resp. rate 18, height 5' (1.524 m), weight 137 lb 4.8 oz (62.3 kg), SpO2 91 %. Body mass index is 26.81 kg/m. GEN: Well nourished, well developed, in no acute distress.  HEENT: Grossly normal.  Neck: Supple, no JVD, carotid bruits, or masses. Cardiac: RRR, no murmurs, rubs, or gallops. No clubbing, cyanosis, trace pitting edema above the sock line bilaterally  Radials/DP/PT 2+ and equal bilaterally.  Respiratory:  Respirations regular and unlabored, on nasal cannula oxygen, scant rales otherwise clear GI: Soft, nontender, nondistended, BS + x 4. MS: no deformity or atrophy. Skin: warm and dry, no rash. Neuro:  Strength and sensation are intact. Psych: AAOx3.  Normal affect, poor historian, soft-spoken     Assessment and Plan:   1. Acute on chronic hypoxic respiratory failure:  Elevated CO2 on arrival by ABG, hypercapnia Predominantly secondary to acute on chronic COPD exasperation,  Exacerbated by underlying anemia BNP low, minimal ankle edema Less likely exacerbation of her chronic diastolic CHF Echocardiogram pending to evaluate right heart pressures --She is on oxygen therapy,  nebulizer therapy, Levaquin for acute bronchitis  on IV Solu-Medrol --Unclear if she would benefit from home BiPAP given her hypercapnia on arrival  2. Hypokalemia:  She has been repleted aggressively Etiology unclear, she denies taking Lasix at home Need to investigate if she has other losses including diarrhea  3. chronic Diastolic congestive heart failure: Currently not on Lasix  at home per the patient Low BNP, trace ankle edema Agree with gentle Lasix while inpatient, may need to take Lasix every other day at home  4. Hyperlipidemia   continue atorvastatin  5. Essential hypertension  continue amlodipine , ramipril and Lopressor  6. Miscellaneous  Lovenox for DVT prophylaxis    Total encounter time more than 110 minutes  Greater than 50% was spent in counseling and coordination of care with the patient   Signed, Dossie Arbour, MD, Ph.D [facility] 02/10/2016

## 2016-02-10 NOTE — Progress Notes (Signed)
Patient refuses dinnertime BG insulin coverage. B.S. = 232. Supposed to get 3 units. Will document on MAR, however, Dr. Allena Katz now unavailable for text page post 18:00. Will pass along in shift report to inform day shift physician in the morning. As for why patient refuses, states "they ain't that high, I'll get 'em down." Explained to patient her current B.S. is about 3 times higher than optimal...no response. Jari Favre [facility]

## 2016-02-10 NOTE — Care Management (Signed)
Patient admitted  with exac of copd/chf.  Patient tells CM she has chronic home 02 through [facility]- "and another one of those machines."  Having [facility] to check to see if patient has a cpap at home.  Patient says she lives in [place]. When asked patient how she came to be in [place] she says "I live here." Then repeats- I live in [place] with may daughter and she is getting tired of me."  left voice mail message with patients daughter to contact caremanager.  Physical therapy evaluated patient and recommended skilled nursing placement

## 2016-02-10 NOTE — Progress Notes (Signed)
*  PRELIMINARY RESULTS* Echocardiogram 2D Echocardiogram has been performed.  [name] 02/10/2016, 1:36 PM

## 2016-02-10 NOTE — Progress Notes (Signed)
Pt arrived from ED to room # 242. Alert and oriented to person and place.Telemetry box verified with Marchelle Folks NT . Skin verified with Adrianne RN . Bed in low position, bed alarm on. Pt oriented to staff and equipment. Daughter at bedside. No concerns offered.

## 2016-02-10 NOTE — Progress Notes (Signed)
Inpatient Diabetes Program Recommendations  AACE/ADA: New Consensus Statement on Inpatient Glycemic Control (2015)  Target Ranges:  Prepandial:   less than 140 mg/dL      Peak postprandial:   less than 180 mg/dL (1-2 hours)      Critically ill patients:  140 - 180 mg/dL   Results for [name], [name] (MRN [id number]) as of 02/10/2016 09:27  Ref. Range 02/09/2016 17:15 02/10/2016 04:34  Glucose Latest Ref Range: 65 - 99 mg/dL 170 (H) 017 (H)    Admit with: Weakness  History: DM2, COPD, CHF  Home DM Meds: None Listed  Current Insulin Orders: None yet      MD- Patient with documented history of Diabetes.  Please consider starting Novolog Sensitive Correction Scale/ SSI (0-9 units) TID AC + HS     --Will follow patient during hospitalization--  Ambrose Finland RN, MSN, CDE Diabetes Coordinator Inpatient Glycemic Control Team Team Pager: [phone] (8a-5p)

## 2016-02-10 NOTE — Progress Notes (Signed)
Inpatient Diabetes Program Recommendations  AACE/ADA: New Consensus Statement on Inpatient Glycemic Control (2015)  Target Ranges:  Prepandial:   less than 140 mg/dL      Peak postprandial:   less than 180 mg/dL (1-2 hours)      Critically ill patients:  140 - 180 mg/dL   Results for KIANAH, HARRIES (MRN [id number]) as of 02/10/2016 09:28  Ref. Range 02/09/2016 17:15 02/10/2016 04:34  Glucose Latest Ref Range: 65 - 99 mg/dL 948 (H) 546 (H)   Review of Glycemic Control  Diabetes history: DM2 Outpatient Diabetes medications: none Current orders for Inpatient glycemic control: none  Inpatient Diabetes Program Recommendations:  Correction (SSI): Patient has a history of DM2. While inpatinet, please consider ordering CBGs with Novolog sensitive correction scale ACHS.  Thanks, Orlando Penner, RN, MSN, CDE Diabetes Coordinator Inpatient Diabetes Program [phone] (Team Pager from 8am to 5pm)

## 2016-02-11 DIAGNOSIS — J441 Chronic obstructive pulmonary disease with (acute) exacerbation: Secondary | ICD-10-CM

## 2016-02-11 DIAGNOSIS — R2681 Unsteadiness on feet: Secondary | ICD-10-CM

## 2016-02-11 DIAGNOSIS — M6281 Muscle weakness (generalized): Secondary | ICD-10-CM

## 2016-02-11 LAB — PROCALCITONIN: Procalcitonin: <0.1 ng/mL

## 2016-02-11 LAB — GLUCOSE, CAPILLARY
GLUCOSE-CAPILLARY: 255 mg/dL — AB (ref 65–99)
GLUCOSE-CAPILLARY: 380 mg/dL — AB (ref 65–99)
Glucose-Capillary: 303 mg/dL — ABNORMAL HIGH (ref 65–99)
Glucose-Capillary: 358 mg/dL — ABNORMAL HIGH (ref 65–99)

## 2016-02-11 LAB — URINE CULTURE

## 2016-02-11 LAB — BASIC METABOLIC PANEL
Anion gap: 7 (ref 5–15)
BUN: 27 mg/dL — ABNORMAL HIGH (ref 6–20)
CHLORIDE: 97 mmol/L — AB (ref 101–111)
CO2: 32 mmol/L (ref 22–32)
Calcium: 9.2 mg/dL (ref 8.9–10.3)
Creatinine, Ser: 0.61 mg/dL (ref 0.44–1.00)
GFR calc non Af Amer: >60 mL/min (ref >60)
Glucose, Bld: 230 mg/dL — ABNORMAL HIGH (ref 65–99)
POTASSIUM: 5.2 mmol/L — AB (ref 3.5–5.1)
SODIUM: 136 mmol/L (ref 135–145)

## 2016-02-11 LAB — CBC
HEMATOCRIT: 35.1 % (ref 35.0–47.0)
HEMOGLOBIN: 11.4 g/dL — AB (ref 12.0–16.0)
MCH: 26 pg (ref 26.0–34.0)
MCHC: 32.5 g/dL (ref 32.0–36.0)
MCV: 79.9 fL — AB (ref 80.0–100.0)
Platelets: 392 10*3/uL (ref 150–440)
RBC: 4.4 MIL/uL (ref 3.80–5.20)
RDW: 15.3 % — ABNORMAL HIGH (ref 11.5–14.5)
WBC: 14.3 10*3/uL — AB (ref 3.6–11.0)

## 2016-02-11 MED ORDER — BUDESONIDE 0.25 MG/2ML IN SUSP
0.2500 mg | Freq: Two times a day (BID) | RESPIRATORY_TRACT | Status: DC
  Administered 2016-02-11 – 2016-02-13 (×4): 0.25 mg via RESPIRATORY_TRACT
  Filled 2016-02-11 (×4): qty 2

## 2016-02-11 MED ORDER — SODIUM CHLORIDE 0.9% FLUSH: 3.0000 mL | INTRAVENOUS | Status: DC | PRN

## 2016-02-11 MED ORDER — IPRATROPIUM-ALBUTEROL 0.5-2.5 (3) MG/3ML IN SOLN
3.0000 mL | RESPIRATORY_TRACT | Status: DC
  Administered 2016-02-11 – 2016-02-13 (×7): 3 mL via RESPIRATORY_TRACT
  Filled 2016-02-11 (×7): qty 3

## 2016-02-11 MED ORDER — SODIUM CHLORIDE 0.9% FLUSH
3.0000 mL | Freq: Two times a day (BID) | INTRAVENOUS | Status: DC
  Administered 2016-02-11 – 2016-02-13 (×3): 3 mL via INTRAVENOUS

## 2016-02-11 NOTE — Clinical Social Work Note (Signed)
Clinical Social Work Assessment  Patient Details  Name: [name] MRN: [id number] Date of Birth: Jun 12, 1931  Date of referral:  02/11/16               Reason for consult:  Facility Placement                Permission sought to share information with:  Oceanographer granted to share information::  Yes, Verbal Permission Granted  Name::        Agency::     Relationship::     Contact Information:     Housing/Transportation Living arrangements for the past 2 months:  Single Family Home Source of Information:  Medical Team Patient Interpreter Needed:  None Criminal Activity/Legal Involvement Pertinent to Current Situation/Hospitalization:  No - Comment as needed Significant Relationships:  Adult Children Lives with:  Adult Children Do you feel safe going back to the place where you live?  Yes Need for family participation in patient care:  No (Coment)  Care giving concerns:  STR   Social Worker assessment / plan:  CSW attempted to visit patient at bedside. Patient was sleeping. CSW was not able to reach the patient's daughter, [name]. [name] had indicated the day prior to the primary CSW that dept has permission to conduct bed search and that the patient would not like [facility] or [facility].  According to charting and the medical team, the patient lives with her daughter in [place] but they are here for a funeral. The patient has a home here in [place], but the patient's daughter does not feel that she is safe alone, but cannot care for her any longer in [place]. The patient may need assistance with ALF considerations; however, in the meantime, she is SNF appropriate.  Employment status:  Retired Health and safety inspector:  [employer] PT Recommendations:  Skilled Teacher, early years/pre / Referral to community resources:  Skilled Nursing Facility  Patient/Family's Response to care:  N/A  Patient/Family's Understanding of and Emotional  Response to Diagnosis, Current Treatment, and Prognosis:  Unknown  Emotional Assessment Appearance:  Appears stated age Attitude/Demeanor/Rapport:   (Patient was sleeping and not able to rouse.) Affect (typically observed):   (Patient was sleeping and not able to rouse.) Orientation:   (Patient was sleeping and not able to rouse.) Alcohol / Substance use:  Never Used Psych involvement (Current and /or in the community):  No (Comment)  Discharge Needs  Concerns to be addressed:  Discharge Planning Concerns Readmission within the last 30 days:  No Current discharge risk:  Chronically ill Barriers to Discharge:  Continued Medical Work up   [employer], LCSW 02/11/2016, 4:20 PM

## 2016-02-11 NOTE — Progress Notes (Signed)
Patient: [name] / Admit Date: 02/09/2016 / Date of Encounter: 02/11/2016, 10:26 AM   Subjective: No further LE swelling. Coughing up thick green to yellow sputum. Echo on 11/10 with normal LV systolic function and mildly elevated right-side pressure.   Review of Systems: Review of Systems  Constitutional: Positive for malaise/fatigue. Negative for chills, diaphoresis, fever and weight loss.  HENT: Negative for congestion.   Eyes: Negative for discharge and redness.  Respiratory: Positive for cough, sputum production, shortness of breath and wheezing. Negative for hemoptysis.        Thick green-yellow sputum  Cardiovascular: Negative for chest pain, palpitations ( ), orthopnea, claudication, leg swelling and PND.  Gastrointestinal: Negative for abdominal pain, blood in stool, heartburn, melena, nausea and vomiting.  Genitourinary: Negative for hematuria.  Musculoskeletal: Negative for falls and myalgias.  Skin: Negative for rash.  Neurological: Positive for weakness. Negative for dizziness, tingling, tremors, sensory change, speech change, focal weakness and loss of consciousness.  Endo/Heme/Allergies: Does not bruise/bleed easily.  Psychiatric/Behavioral: Negative for substance abuse. The patient is not nervous/anxious.   All other systems reviewed and are negative.   Objective: Telemetry: NSR, upper 90's bpm Physical Exam: Blood pressure (!) 157/70, pulse 74, temperature 98.4 F (36.9 C), temperature source Oral, resp. rate 20, height 5' (1.524 m), weight 134 lb 1.6 oz (60.8 kg), SpO2 92 %. Body mass index is 26.19 kg/m. General: Well developed, well nourished, in no acute distress. Head: Normocephalic, atraumatic, sclera non-icteric, no xanthomas, nares are without discharge. Neck: Negative for carotid bruits. JVP not elevated. Lungs: Decreased breath sounds bilaterally with diffuse rhonchi. Breathing is unlabored. Heart: RRR S1 S2 without murmurs, rubs, or gallops.    Abdomen: Soft, non-tender, non-distended with normoactive bowel sounds. No rebound/guarding. Extremities: No clubbing or cyanosis. No edema. Distal pedal pulses are 2+ and equal bilaterally. Neuro: Alert and oriented X 3. Moves all extremities spontaneously. Psych:  Responds to questions appropriately with a normal affect.   Intake/Output Summary (Last 24 hours) at 02/11/16 1026 Last data filed at 02/11/16 0913  Gross per 24 hour  Intake              360 ml  Output                0 ml  Net              360 ml    Inpatient Medications:  . acidophilus  1 capsule Oral Daily  . amLODipine  5 mg Oral Daily  . aspirin EC  81 mg Oral Daily  . atorvastatin  40 mg Oral q1800  . enoxaparin (LOVENOX) injection  40 mg Subcutaneous Q24H  . fentaNYL  25 mcg Transdermal Q72H  . insulin aspart  0-5 Units Subcutaneous QHS  . insulin aspart  0-9 Units Subcutaneous TID WC  . ipratropium-albuterol  3 mL Nebulization Q4H  . levofloxacin  250 mg Oral Daily  . methylPREDNISolone (SOLU-MEDROL) injection  60 mg Intravenous Q12H  . metoprolol  50 mg Oral BID  . mometasone-formoterol  2 puff Inhalation BID  . ramipril  10 mg Oral QHS  . tiotropium  18 mcg Inhalation Daily   Infusions:   Labs:  Recent Labs  02/09/16 1715 02/10/16 0434 02/11/16 0439  NA 137 138 136  K 2.9* 2.9* 5.2*  CL 94* 89* 97*  CO2 38* 43* 32  GLUCOSE 279* 157* 230*  BUN 18 16 27*  CREATININE 0.55 0.62 0.61  CALCIUM 9.0 8.8*  9.2  MG 1.8 2.0  --    No results for input(s): AST, ALT, ALKPHOS, BILITOT, PROT, ALBUMIN in the last 72 hours.  Recent Labs  02/10/16 0434 02/11/16 0439  WBC 13.5* 14.3*  HGB 10.1* 11.4*  HCT 30.9* 35.1  MCV 80.0 79.9*  PLT 334 392    Recent Labs  02/09/16 1734  TROPONINI 0.04*   Invalid input(s): POCBNP No results for input(s): HGBA1C in the last 72 hours.   Weights: Filed Weights   02/09/16 2128 02/10/16 0415 02/11/16 0433  Weight: 141 lb 6.4 oz (64.1 kg) 137 lb 4.8 oz (62.3  kg) 134 lb 1.6 oz (60.8 kg)     Radiology/Studies:  Dg Chest 2 View  Result Date: 02/09/2016 CLINICAL DATA:  Increased lethargy generalized weakness for 3 days. Pain in the left flank. EXAM: CHEST  2 VIEW COMPARISON:  10/14/2015 FINDINGS: Chronic elevation of the right hemidiaphragm is noted with bibasilar atelectasis. Mild vascular congestion noted without pneumonic consolidation or pneumothorax. Patient is status post CABG. Neural stimulator device projects along the dorsal aspect of the mid thoracic spine. The patient is status post bilateral shoulder arthroplasties without evidence of hardware failure. IMPRESSION: Chronically elevated right hemidiaphragm as before with bibasilar atelectasis. Mild vascular congestion without effusion in. No pneumonic consolidations. Electronically Signed   By: Tollie Eth M.D.   On: 02/09/2016 18:49   Ct Head Wo Contrast  Result Date: 02/09/2016 CLINICAL DATA:  Lethargy an generalize weakness EXAM: CT HEAD WITHOUT CONTRAST TECHNIQUE: Contiguous axial images were obtained from the base of the skull through the vertex without intravenous contrast. COMPARISON:  None. FINDINGS: Brain: No acute territorial infarction, intracranial hemorrhage or extra-axial fluid collection. No focal mass, mass effect or midline shift. Mild atrophy. Mild periventricular white matter hypodensities, consistent with small vessel disease. Vascular: No hyperdense vessels. There are carotid artery calcifications. Skull: Mastoid air cells are clear. There is no fracture. There is a hypoplastic appearing left mandibular head. Sinuses/Orbits: Mild mucosal thickening within the maxillary and ethmoid sinuses. No acute orbital abnormality. Other: None IMPRESSION: 1. No CT evidence for acute intracranial abnormality 2. Mild atrophy and small vessel ischemic change of the periventricular white matter. Electronically Signed   By: Jasmine Pang M.D.   On: 02/09/2016 18:16     Assessment and Plan  Active  Problems:   Acute on chronic respiratory failure (HCC)   Weakness   Hypercapnemia    1. Acute on chronic respiratory failure with hypoxia and hypercapnia: -Likely mostly 2/2 AECOPD and underlying anemia -Less likely acute on chronic diastolic CHF given low BNP, normal LVSF and minimal LE swelling that is now resolved -Wean oxygen to baseline as able  2. AECOPD: -Change prn nebs to standing -Consider Xopenex in place of albuterol as she is at high-risk of developing Afib -Continue ABX and steroids per IM  3. Chronic diastolic CHF: -She does not appear volume overloaded -Stop scheduled Lasix at this time -Continue Lopressor, if needed given pulmonary function this may need to be held  4. Hypokalemia: -Status post repletion   5. HLD: -Lipitor  6. HTN: -Continue current medications   Signed, Carola Frost [facility] Pager: [phone] 02/11/2016, 10:26 AM   Attending Note Patient seen and examined, agree with detailed note above,  Patient presentation and plan discussed on rounds.   Thick cough this morning, trouble getting up Reports breathing is somewhat improved On antibiotics, steroids, nebulizer On his cannula oxygen 2 L Minimal leg swelling resolved  Echocardiogram  reviewed with [name], normal LV function, mildly elevated right heart pressures On physical exam decreased breath sounds bilaterally, scant diffuse rales --Tachycardia, regular rhythm with no murmurs appreciated, abdomen soft nontender, no leg edema  ----Hypoxia/hypercapnia respiratory distress Improved with pulmonary regiment Less likely CHF as climbing BUN with minimal Lasix Will hold Lasix given change in labwork, to avoid prerenal state May benefit from BiPAP when sleeping and snapping given hypercapnia on arrival  ----Chronic diastolic CHF Does not appear to be the cause of [name] admission Does not take Lasix at home Minimal Lasix this admission now appearing prerenal As an  outpatient would only take Lasix as needed for ankle swelling  Discussed with Dr. Hilton Sinclair Greater than 50% was spent in counseling and coordination of care with patient Total encounter time 35 minutes or more   Signed: Dossie Arbour  M.D., Ph.D. [facility]

## 2016-02-11 NOTE — Progress Notes (Signed)
Patient ID: [name], female   DOB: 1931-05-30, 82 y.o.   MRN: [id number]  [employer] PROGRESS NOTE  [name] NWG:[id number] DOB: 02/22/1932 DOA: 02/09/2016 PCP: Melanie Beat, MD  HPI/Subjective: Patient still feeling some shortness of breath. Does not feel that her breathing is back to normal. Some cough and some wheeze.  Objective: Vitals:   02/11/16 0800 02/11/16 1117  BP: (!) 157/70 (!) 154/124  Pulse: 74 76  Resp: 20 20  Temp: 98.4 F (36.9 C) 97.7 F (36.5 C)    Filed Weights   02/09/16 2128 02/10/16 0415 02/11/16 0433  Weight: 64.1 kg (141 lb 6.4 oz) 62.3 kg (137 lb 4.8 oz) 60.8 kg (134 lb 1.6 oz)    ROS: Review of Systems  Constitutional: Negative for chills and fever.  Eyes: Negative for blurred vision.  Respiratory: Positive for cough, shortness of breath and wheezing.   Cardiovascular: Negative for chest pain.  Gastrointestinal: Negative for abdominal pain, constipation, diarrhea, nausea and vomiting.  Genitourinary: Negative for dysuria.  Musculoskeletal: Negative for joint pain.  Neurological: Negative for dizziness and headaches.   Exam: Physical Exam  Constitutional: She is oriented to person, place, and time.  HENT:  Nose: No mucosal edema.  Mouth/Throat: No oropharyngeal exudate or posterior oropharyngeal edema.  Eyes: Conjunctivae, EOM and lids are normal. Pupils are equal, round, and reactive to light.  Neck: No JVD present. Carotid bruit is not present. No edema present. No thyroid mass and no thyromegaly present.  Cardiovascular: S1 normal and S2 normal.  Exam reveals no gallop.   Murmur heard.  Systolic murmur is present with a grade of 2/6  Pulses:      Dorsalis pedis pulses are 2+ on the right side, and 2+ on the left side.  Respiratory: No respiratory distress. She has decreased breath sounds in the right middle field, the right lower field and the left middle field. She has wheezes in the right middle field and the left  middle field. She has rhonchi in the right lower field and the left lower field. She has no rales.  GI: Soft. Bowel sounds are normal. There is no tenderness.  Musculoskeletal:       Right ankle: She exhibits swelling.       Left ankle: She exhibits swelling.  Lymphadenopathy:    She has no cervical adenopathy.  Neurological: She is alert and oriented to person, place, and time. No cranial nerve deficit.  Skin: Skin is warm. No rash noted. Nails show no clubbing.  Psychiatric: She has a normal mood and affect.      Data Reviewed: Basic Metabolic Panel:  Recent Labs Lab 02/09/16 1715 02/10/16 0434 02/11/16 0439  NA 137 138 136  K 2.9* 2.9* 5.2*  CL 94* 89* 97*  CO2 38* 43* 32  GLUCOSE 279* 157* 230*  BUN 18 16 27*  CREATININE 0.55 0.62 0.61  CALCIUM 9.0 8.8* 9.2  MG 1.8 2.0  --    CBC:  Recent Labs Lab 02/09/16 1715 02/10/16 0434 02/11/16 0439  WBC 12.8* 13.5* 14.3*  HGB 9.8* 10.1* 11.4*  HCT 31.1* 30.9* 35.1  MCV 80.5 80.0 79.9*  PLT 351 334 392   Cardiac Enzymes:  Recent Labs Lab 02/09/16 1734  TROPONINI 0.04*   BNP (last 3 results)  Recent Labs  04/05/15 1734 10/14/15 1021 02/09/16 1715  BNP 159.0* 397.0* 176.0*      CBG:  Recent Labs Lab 02/10/16 1704 02/10/16 2101 02/11/16 0754 02/11/16 1119  GLUCAP 232* 319* 303* 358*    Recent Results (from the past 240 hour(s))  Urine culture     Status: Abnormal   Collection Time: 02/09/16  5:16 PM  Result Value Ref Range Status   Specimen Description URINE, CATHETERIZED  Final   Special Requests NONE  Final   Culture MULTIPLE SPECIES PRESENT, SUGGEST RECOLLECTION (A)  Final   Report Status 02/11/2016 FINAL  Final  Culture, blood (routine x 2)     Status: None (Preliminary result)   Collection Time: 02/09/16  7:56 PM  Result Value Ref Range Status   Specimen Description BLOOD RIGHT ARM  Final   Special Requests BOTTLES DRAWN AEROBIC AND ANAEROBIC 5CC  Final   Culture NO GROWTH 2 DAYS   Final   Report Status PENDING  Incomplete  Culture, blood (routine x 2)     Status: None (Preliminary result)   Collection Time: 02/09/16  8:26 PM  Result Value Ref Range Status   Specimen Description BLOOD RIGHT ARM  Final   Special Requests BOTTLES DRAWN AEROBIC AND ANAEROBIC 5CC  Final   Culture NO GROWTH 2 DAYS  Final   Report Status PENDING  Incomplete     Studies: Dg Chest 2 View  Result Date: 02/09/2016 CLINICAL DATA:  Increased lethargy generalized weakness for 3 days. Pain in the left flank. EXAM: CHEST  2 VIEW COMPARISON:  10/14/2015 FINDINGS: Chronic elevation of the right hemidiaphragm is noted with bibasilar atelectasis. Mild vascular congestion noted without pneumonic consolidation or pneumothorax. Patient is status post CABG. Neural stimulator device projects along the dorsal aspect of the mid thoracic spine. The patient is status post bilateral shoulder arthroplasties without evidence of hardware failure. IMPRESSION: Chronically elevated right hemidiaphragm as before with bibasilar atelectasis. Mild vascular congestion without effusion in. No pneumonic consolidations. Electronically Signed   By: Tollie Eth M.D.   On: 02/09/2016 18:49   Ct Head Wo Contrast  Result Date: 02/09/2016 CLINICAL DATA:  Lethargy an generalize weakness EXAM: CT HEAD WITHOUT CONTRAST TECHNIQUE: Contiguous axial images were obtained from the base of the skull through the vertex without intravenous contrast. COMPARISON:  None. FINDINGS: Brain: No acute territorial infarction, intracranial hemorrhage or extra-axial fluid collection. No focal mass, mass effect or midline shift. Mild atrophy. Mild periventricular white matter hypodensities, consistent with small vessel disease. Vascular: No hyperdense vessels. There are carotid artery calcifications. Skull: Mastoid air cells are clear. There is no fracture. There is a hypoplastic appearing left mandibular head. Sinuses/Orbits: Mild mucosal thickening within the  maxillary and ethmoid sinuses. No acute orbital abnormality. Other: None IMPRESSION: 1. No CT evidence for acute intracranial abnormality 2. Mild atrophy and small vessel ischemic change of the periventricular white matter. Electronically Signed   By: Jasmine Pang M.D.   On: 02/09/2016 18:16    Scheduled Meds: . acidophilus  1 capsule Oral Daily  . amLODipine  5 mg Oral Daily  . aspirin EC  81 mg Oral Daily  . atorvastatin  40 mg Oral q1800  . budesonide (PULMICORT) nebulizer solution  0.25 mg Nebulization BID  . enoxaparin (LOVENOX) injection  40 mg Subcutaneous Q24H  . fentaNYL  25 mcg Transdermal Q72H  . insulin aspart  0-5 Units Subcutaneous QHS  . insulin aspart  0-9 Units Subcutaneous TID WC  . ipratropium-albuterol  3 mL Nebulization Q4H  . levofloxacin  250 mg Oral Daily  . methylPREDNISolone (SOLU-MEDROL) injection  60 mg Intravenous Q12H  . metoprolol  50 mg Oral BID  .  ramipril  10 mg Oral QHS  . tiotropium  18 mcg Inhalation Daily    Assessment/Plan:  1. Acute on chronic hypoxic and hypercarbic respiratory failure. Continue BiPAP at night. In the hospital. Consult care manager for noninvasive ventilation at home. Continue 2.5 L here in the hospital. 2. COPD exacerbation. Continue Levaquin and nebulizer therapy. Solu-Medrol. 3. Hypokalemia. Potassium now replaced into hyperkalemia range. Stop potassium replacement and check again tomorrow. 4. Acute on chronic diastolic congestive heart failure. Cardiology stopped the Lasix at this point. 5. Weakness physical therapy evaluation appreciated. They recommended rehabilitation. 6. Chronic pain on fentanyl patch 7. Hyperlipidemia and specified on atorvastatin 8. Since on hypertension continue amlodipine, ramipril Lopressor  Code Status:     Code Status Orders        Start     Ordered   02/09/16 2013  Full code  Continuous     02/09/16 2012    Code Status History    Date Active Date Inactive Code Status Order ID  Comments User Context   02/09/2016  7:45 PM 02/09/2016  8:12 PM DNR [id number]  Wyatt Haste, MD ED   04/20/2015  9:14 PM 04/22/2015  6:32 PM DNR [id number]  Alford Highland, MD ED   04/05/2015 10:42 PM 04/07/2015  4:50 PM Full Code [id number]  Wyatt Haste, MD ED   01/26/2012  3:04 PM 01/29/2012  7:25 PM Full Code [id number]  Tacey Ruiz, RN Inpatient   01/22/2012  1:10 PM 01/26/2012  3:04 PM Full Code [id number]  Maggie Schwalbe, RN Inpatient      Disposition Plan: Likely will need rehabilitation  Consultants:  Cardiology  Antibiotics:  Levaquin  Time spent: 28 minutes  Alford Highland  [employer]

## 2016-02-11 NOTE — Progress Notes (Signed)
Daughter at bedside. Wanted to try bipap with patient since she was ready for bed.  I got a nasal mask for use vs full face which is what we tried last night. Patient was willing to try.  Patient wore for appx 3 min and took off. [place] Spaniel it was smothering her and she could not use it.  Daughter explained to why she needed to give it a chance however patient would not put mask back on.  Placed patient back on nasal cannula. Will continue to monitor.

## 2016-02-11 NOTE — Progress Notes (Signed)
Offered to place pt on CPAP for sleep. Pt refused.

## 2016-02-11 NOTE — [place] FL2 (Signed)
[place] MEDICAID FL2 LEVEL OF CARE SCREENING TOOL     IDENTIFICATION  Patient Name: [name] Birthdate: [birth year] Sex: female Admission Date (Current Location): 02/09/2016  [place] and [place] Number:  Chiropodist and Address:  [facility], [address], [place], [place] [address]      Provider Number: [id number]  Attending Physician Name and Address:  Alford Highland, MD  Relative Name and Phone Number:       Current Level of Care: Hospital Recommended Level of Care: Skilled Nursing Facility Prior Approval Number:    Date Approved/Denied: 04/20/08 PASRR Number: [id number]  Discharge Plan: SNF    Current Diagnoses: Patient Active Problem List   Diagnosis Date Noted  . Muscle weakness (generalized)   . Unsteadiness on feet   . Weakness   . Hypercapnemia   . Acute on chronic respiratory failure (HCC) 02/09/2016  . Tobacco abuse counseling 09/27/2015  . 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 Chronic respiratory failure with hypoxia (HCC) 04/14/2015  . Failed back syndrome of lumbar spine 11/30/2014  . 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 PAF (paroxysmal atrial fibrillation) (HCC) 12/16/2013  . COPD exacerbation (HCC) 01/13/2013  . History of GI diverticular bleed 09/25/2012  . Depression 06/17/2012  . Hx of CABG 04/18/2012  . Chronic diastolic CHF (congestive heart failure) (HCC) 02/05/2012  . Splenic artery aneurysm (HCC)   . History of non-ST elevation myocardial infarction (NSTEMI) 07/22/2011  . Smoking 02/09/2011  . AAA (abdominal aortic aneurysm) (HCC) 12/22/2010  . DIVERTICULOSIS OF COLON 05/14/2008  . ANXIETY DEPRESSION 04/19/2008  . ECZEMA 07/21/2007  . ARTERIOVENOUS MALFORMATION, GASTRIC 11/08/2006  . INSOMNIA, CHRONIC 07/21/2006  . ANEMIA-IRON DEFICIENCY 07/01/2004  . Chronic obstructive pulmonary disease (HCC) 05/31/2004  . CAD (coronary artery disease) 09/01/2002   . FATTY LIVER DISEASE 07/16/2002  . OSTEOPOROSIS 12/31/2000  . HYPERLIPIDEMIA 07/02/1999  . 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 Essential hypertension, benign 08/01/1995  . Rheumatoid arthritis(714.0) 04/03/1983  . OSTEOARTHRITIS 04/03/1983    Orientation RESPIRATION BLADDER Height & Weight     Self, Time, Situation  O2 (2L o2) Continent Weight: 134 lb 1.6 oz (60.8 kg) Height:  5' (152.4 cm)  BEHAVIORAL SYMPTOMS/MOOD NEUROLOGICAL BOWEL NUTRITION STATUS      Continent    AMBULATORY STATUS COMMUNICATION OF NEEDS Skin   Total Care Verbally                         Personal Care Assistance Level of Assistance  Bathing, Dressing Bathing Assistance: Maximum assistance   Dressing Assistance: Maximum assistance     Functional Limitations Info             SPECIAL CARE FACTORS FREQUENCY  PT (By licensed PT)     PT Frequency: Up to 5X per day 5 days per week              Contractures Contractures Info: Present    Additional Factors Info  Allergies   Allergies Info:  Other, Penicillins, Sulfa Antibiotics, Morphine Sulfate, Macrolides And Ketolides, Iron, Naproxen           Current Medications (02/11/2016):  This is the current hospital active medication list Current Facility-Administered Medications  Medication Dose Route Frequency Provider Last Rate Last Dose  . acetaminophen (TYLENOL) tablet 650 mg  650 mg Oral Q6H PRN Wyatt Haste, MD   650 mg at 02/11/16 1128  Or  . acetaminophen (TYLENOL) suppository 650 mg  650 mg Rectal Q6H PRN Wyatt Haste, MD      . acidophilus (RISAQUAD) capsule 1 capsule  1 capsule Oral Daily Wyatt Haste, MD   1 capsule at 02/11/16 0949  . amLODipine (NORVASC) tablet 5 mg  5 mg Oral Daily Wyatt Haste, MD   5 mg at 02/11/16 0950  . aspirin EC tablet 81 mg  81 mg Oral Daily Wyatt Haste, MD   81 mg at 02/11/16 0950  . atorvastatin (LIPITOR) tablet 40 mg  40 mg Oral q1800 Wyatt Haste, MD   40 mg at 02/10/16 1800  . budesonide (PULMICORT) nebulizer solution 0.25 mg  0.25 mg Nebulization BID Alford Highland, MD      . diphenhydrAMINE (BENADRYL) capsule 25 mg  25 mg Oral QHS PRN Alexis Hugelmeyer, DO   25 mg at 02/11/16 0007  . docusate sodium (COLACE) capsule 100 mg  100 mg Oral BID PRN Wyatt Haste, MD      . enoxaparin (LOVENOX) injection 40 mg  40 mg Subcutaneous Q24H Wyatt Haste, MD   40 mg at 02/10/16 2248  . fentaNYL (DURAGESIC - dosed mcg/hr) patch 25 mcg  25 mcg Transdermal Q72H Auburn Bilberry, MD   25 mcg at 02/10/16 1555  . insulin aspart (novoLOG) injection 0-5 Units  0-5 Units Subcutaneous QHS Auburn Bilberry, MD   4 Units at 02/10/16 2248  . insulin aspart (novoLOG) injection 0-9 Units  0-9 Units Subcutaneous TID WC Auburn Bilberry, MD   9 Units at 02/11/16 1300  . ipratropium-albuterol (DUONEB) 0.5-2.5 (3) MG/3ML nebulizer solution 3 mL  3 mL Nebulization Q4H Ryan M Dunn, PA-C   3 mL at 02/11/16 1530  . levofloxacin (LEVAQUIN) tablet 250 mg  250 mg Oral Daily Auburn Bilberry, MD   250 mg at 02/11/16 0950  . methylPREDNISolone sodium succinate (SOLU-MEDROL) 125 mg/2 mL injection 60 mg  60 mg Intravenous Q12H Auburn Bilberry, MD   60 mg at 02/11/16 0949  . metoprolol (LOPRESSOR) tablet 50 mg  50 mg Oral BID Wyatt Haste, MD   50 mg at 02/11/16 0949  . ondansetron (ZOFRAN) tablet 4 mg  4 mg Oral Q6H PRN Wyatt Haste, MD       Or  . ondansetron [facility]) injection 4 mg  4 mg Intravenous Q6H PRN Wyatt Haste, MD      . oxyCODONE (Oxy IR/ROXICODONE) immediate release tablet 5 mg  5 mg Oral Q4H PRN Auburn Bilberry, MD   5 mg at 02/11/16 1323  . ramipril (ALTACE) capsule 10 mg  10 mg Oral QHS Wyatt Haste, MD   10 mg at 02/10/16 2249  . tiotropium (SPIRIVA) inhalation capsule 18 mcg  18 mcg Inhalation Daily Wyatt Haste, MD   18 mcg at 02/11/16 4967     Discharge Medications: Please see discharge summary for a list of discharge medications.  Relevant Imaging  Results:  Relevant Lab Results:   Additional Information  SS [id number]  Judi Cong, LCSW

## 2016-02-12 LAB — GLUCOSE, CAPILLARY
GLUCOSE-CAPILLARY: 325 mg/dL — AB (ref 65–99)
GLUCOSE-CAPILLARY: 331 mg/dL — AB (ref 65–99)
Glucose-Capillary: 260 mg/dL — ABNORMAL HIGH (ref 65–99)
Glucose-Capillary: 299 mg/dL — ABNORMAL HIGH (ref 65–99)

## 2016-02-12 LAB — BASIC METABOLIC PANEL
ANION GAP: 7 (ref 5–15)
BUN: 43 mg/dL — AB (ref 6–20)
CHLORIDE: 94 mmol/L — AB (ref 101–111)
CO2: 32 mmol/L (ref 22–32)
Calcium: 8.9 mg/dL (ref 8.9–10.3)
Creatinine, Ser: 0.88 mg/dL (ref 0.44–1.00)
GFR calc Af Amer: >60 mL/min (ref >60)
GFR, EST NON AFRICAN AMERICAN: 60 mL/min — AB (ref >60)
GLUCOSE: 268 mg/dL — AB (ref 65–99)
POTASSIUM: 4.6 mmol/L (ref 3.5–5.1)
Sodium: 133 mmol/L — ABNORMAL LOW (ref 135–145)

## 2016-02-12 MED ORDER — TIZANIDINE HCL 4 MG PO TABS
4.0000 mg | ORAL_TABLET | Freq: Every evening | ORAL | Status: DC | PRN
  Administered 2016-02-12: 4 mg via ORAL
  Filled 2016-02-12 (×2): qty 1

## 2016-02-12 MED ORDER — METHYLPREDNISOLONE SODIUM SUCC 40 MG IJ SOLR
40.0000 mg | Freq: Two times a day (BID) | INTRAMUSCULAR | Status: DC
  Administered 2016-02-12: 40 mg via INTRAVENOUS
  Filled 2016-02-12: qty 1

## 2016-02-12 MED ORDER — LIDOCAINE 5 % EX PTCH
1.0000 | MEDICATED_PATCH | CUTANEOUS | Status: DC
  Administered 2016-02-12 – 2016-02-13 (×2): 1 via TRANSDERMAL
  Filled 2016-02-12 (×2): qty 1

## 2016-02-12 MED ORDER — INSULIN GLARGINE 100 UNIT/ML ~~LOC~~ SOLN
15.0000 [IU] | Freq: Every day | SUBCUTANEOUS | Status: DC
  Administered 2016-02-12 – 2016-02-13 (×2): 15 [IU] via SUBCUTANEOUS
  Filled 2016-02-12 (×2): qty 0.15

## 2016-02-12 MED ORDER — METHYLPREDNISOLONE SODIUM SUCC 40 MG IJ SOLR: 40.0000 mg | Freq: Every day | INTRAMUSCULAR | Status: DC

## 2016-02-12 NOTE — Plan of Care (Signed)
Problem: Pain Managment: Goal: General experience of comfort will improve Outcome: Not Progressing Patient complains of unrelieved pain. Patient states pain remains a 9/10 after medication administration.

## 2016-02-12 NOTE — Clinical Social Work Note (Signed)
CSW visited patient and daughter at bedside. The patient's daughter thanked the CSW for update, and indicated that she is not interested in bed offers from [facility] or [facility] due to distance. CSW will con't to follow.  Argentina Ponder, MSW, LCSW-A [phone]

## 2016-02-12 NOTE — Care Management Note (Signed)
Case Management Note  Patient Details  Name: KEYRI SALBERG MRN: [id number] Date of Birth: 1931/12/03  Subjective/Objective:      Discussed night time non-invasive ventilation for Ms Tania Ade with Britta Mccreedy at [facility]. Lesle Reek will call back when it is determined whether or not a trilogy can be qualified for and provided over the weekend.               Action/Plan:   Expected Discharge Date:                  Expected Discharge Plan:     In-House Referral:     Discharge planning Services     Post Acute Care Choice:    Choice offered to:     DME Arranged:    DME Agency:     HH Arranged:    HH Agency:     Status of Service:     If discussed at [employer] of Stay Meetings, dates discussed:    Additional Comments:  Rakiya Krawczyk A, RN 02/12/2016, 8:21 AM

## 2016-02-12 NOTE — Care Management Note (Signed)
Case Management Note  Patient Details  Name: [name] MRN: [id number] Date of Birth: [birth year]  Subjective/Objective:      This Clinical research associate spoke with Greggory Stallion in [facility] and requested an overnight pulse oximetry continuous reading to be recorded. Dr Renae Gloss is trying to initiate  non-invasive ventilation for [name] whether she goes to SNF or to her home after discharge. [facility] RN Greer Ee was updated by this Clinical research associate and asked to please pass along to the night shift tonight.               Action/Plan:   Expected Discharge Date:                  Expected Discharge Plan:     In-House Referral:     Discharge planning Services     Post Acute Care Choice:    Choice offered to:     DME Arranged:    DME Agency:     HH Arranged:    HH Agency:     Status of Service:     If discussed at [employer] of Stay Meetings, dates discussed:    Additional Comments:  Parys Elenbaas A, RN 02/12/2016, 12:33 PM

## 2016-02-12 NOTE — Plan of Care (Signed)
Problem: Pain Managment: Goal: General experience of comfort will improve Outcome: Not Progressing Continued complaints of back pain voiced.  Sleeping at short intervals and awakens to request pain meds.  Repositioned in bed frequently for comfort.  Pt unable to tolerate sitting in recliner.  Warm blankets applied to back for temporary comfort.

## 2016-02-12 NOTE — Progress Notes (Signed)
Patient ID: [name], female   DOB: December 09, 1931, 82 y.o.   MRN: [id number]  [employer] PROGRESS NOTE  [name] RFV:[id number] DOB: 1931-08-30 DOA: 02/09/2016 PCP: Hannah Beat, MD  HPI/Subjective: The patient feels a little better with her breathing.  Patient still with some shortness of breath and cough. 10 out of 10 back pain if she doesn't take any pain medications. 6 out of 10 pain with pain medication.  Objective: Vitals:   02/11/16 1954 02/12/16 0439  BP: (!) 135/48 (!) 148/65  Pulse: 88 78  Resp: 18 18  Temp: 98 F (36.7 C) 97.7 F (36.5 C)    Filed Weights   02/10/16 0415 02/11/16 0433 02/12/16 0439  Weight: 62.3 kg (137 lb 4.8 oz) 60.8 kg (134 lb 1.6 oz) 62.8 kg (138 lb 6.4 oz)    ROS: Review of Systems  Constitutional: Negative for chills and fever.  Eyes: Negative for blurred vision.  Respiratory: Positive for cough, shortness of breath and wheezing.   Cardiovascular: Negative for chest pain.  Gastrointestinal: Negative for abdominal pain, constipation, diarrhea, nausea and vomiting.  Genitourinary: Negative for dysuria.  Musculoskeletal: Negative for joint pain.  Neurological: Negative for dizziness and headaches.   Exam: Physical Exam  Constitutional: She is oriented to person, place, and time.  HENT:  Nose: No mucosal edema.  Mouth/Throat: No oropharyngeal exudate or posterior oropharyngeal edema.  Eyes: Conjunctivae, EOM and lids are normal. Pupils are equal, round, and reactive to light.  Neck: No JVD present. Carotid bruit is not present. No edema present. No thyroid mass and no thyromegaly present.  Cardiovascular: S1 normal and S2 normal.  Exam reveals no gallop.   Murmur heard.  Systolic murmur is present with a grade of 2/6  Pulses:      Dorsalis pedis pulses are 2+ on the right side, and 2+ on the left side.  Respiratory: No respiratory distress. She has decreased breath sounds in the right lower field and the left lower field.  She has no wheezes. She has rhonchi in the right lower field and the left lower field. She has no rales.  GI: Soft. Bowel sounds are normal. There is no tenderness.  Musculoskeletal:       Right ankle: She exhibits swelling.       Left ankle: She exhibits swelling.  Lymphadenopathy:    She has no cervical adenopathy.  Neurological: She is alert and oriented to person, place, and time. No cranial nerve deficit.  Skin: Skin is warm. No rash noted. Nails show no clubbing.  Psychiatric: She has a normal mood and affect.      Data Reviewed: Basic Metabolic Panel:  Recent Labs Lab 02/09/16 1715 02/10/16 0434 02/11/16 0439 02/12/16 0812  NA 137 138 136 133*  K 2.9* 2.9* 5.2* 4.6  CL 94* 89* 97* 94*  CO2 38* 43* 32 32  GLUCOSE 279* 157* 230* 268*  BUN 18 16 27* 43*  CREATININE 0.55 0.62 0.61 0.88  CALCIUM 9.0 8.8* 9.2 8.9  MG 1.8 2.0  --   --    CBC:  Recent Labs Lab 02/09/16 1715 02/10/16 0434 02/11/16 0439  WBC 12.8* 13.5* 14.3*  HGB 9.8* 10.1* 11.4*  HCT 31.1* 30.9* 35.1  MCV 80.5 80.0 79.9*  PLT 351 334 392   Cardiac Enzymes:  Recent Labs Lab 02/09/16 1734  TROPONINI 0.04*   BNP (last 3 results)  Recent Labs  04/05/15 1734 10/14/15 1021 02/09/16 1715  BNP 159.0* 397.0* 176.0*  CBG:  Recent Labs Lab 02/11/16 0754 02/11/16 1119 02/11/16 1622 02/11/16 2105 02/12/16 0741  GLUCAP 303* 358* 255* 380* 260*    Recent Results (from the past 240 hour(s))  Urine culture     Status: Abnormal   Collection Time: 02/09/16  5:16 PM  Result Value Ref Range Status   Specimen Description URINE, CATHETERIZED  Final   Special Requests NONE  Final   Culture MULTIPLE SPECIES PRESENT, SUGGEST RECOLLECTION (A)  Final   Report Status 02/11/2016 FINAL  Final  Culture, blood (routine x 2)     Status: None (Preliminary result)   Collection Time: 02/09/16  7:56 PM  Result Value Ref Range Status   Specimen Description BLOOD RIGHT ARM  Final   Special Requests  BOTTLES DRAWN AEROBIC AND ANAEROBIC 5CC  Final   Culture NO GROWTH 2 DAYS  Final   Report Status PENDING  Incomplete  Culture, blood (routine x 2)     Status: None (Preliminary result)   Collection Time: 02/09/16  8:26 PM  Result Value Ref Range Status   Specimen Description BLOOD RIGHT ARM  Final   Special Requests BOTTLES DRAWN AEROBIC AND ANAEROBIC 5CC  Final   Culture NO GROWTH 2 DAYS  Final   Report Status PENDING  Incomplete      Scheduled Meds: . acidophilus  1 capsule Oral Daily  . amLODipine  5 mg Oral Daily  . aspirin EC  81 mg Oral Daily  . atorvastatin  40 mg Oral q1800  . budesonide (PULMICORT) nebulizer solution  0.25 mg Nebulization BID  . enoxaparin (LOVENOX) injection  40 mg Subcutaneous Q24H  . fentaNYL  25 mcg Transdermal Q72H  . insulin aspart  0-5 Units Subcutaneous QHS  . insulin aspart  0-9 Units Subcutaneous TID WC  . ipratropium-albuterol  3 mL Nebulization Q4H  . levofloxacin  250 mg Oral Daily  . lidocaine  1 patch Transdermal Q24H  . methylPREDNISolone (SOLU-MEDROL) injection  40 mg Intravenous Q12H  . metoprolol  50 mg Oral BID  . ramipril  10 mg Oral QHS  . sodium chloride flush  3 mL Intravenous Q12H  . tiotropium  18 mcg Inhalation Daily    Assessment/Plan:  1. Acute on chronic hypoxic and hypercarbic respiratory failure. Continue BiPAP at night.  I told the patient she must wear this BiPAP at night to avoid repeat hospitalizations.  In the hospital. Consult care manager for noninvasive ventilation at home. Continue 2.5 L here in the hospital. 2. COPD exacerbation. Continue Levaquin and nebulizer therapy. Solu-Medrol dose decreased to 40 mg every 12 3. Hypokalemia. Potassium in normal range.  4. Acute on chronic diastolic congestive heart failure. Cardiology stopped the Lasix at this point. 5. Weakness physical therapy evaluation appreciated. They recommended rehabilitation. 6. Chronic pain on fentanyl patch, oxycodone. The pain medications  could be contributing to her CO2 retention Try Lidoderm pat if this helps. 7. Hyperlipidemia and specified on atorvastatin 8. Since on hypertension continue amlodipine, ramipril Lopressor  Code Status:     Code Status Orders        Start     Ordered   02/09/16 2013  Full code  Continuous     02/09/16 2012    Code Status History    Date Active Date Inactive Code Status Order ID Comments User Context   02/09/2016  7:45 PM 02/09/2016  8:12 PM DNR [id number]  Wyatt Haste, MD ED   04/20/2015  9:14 PM 04/22/2015  6:32 PM DNR  [id number]  Alford Highland, MD ED   04/05/2015 10:42 PM 04/07/2015  4:50 PM Full Code [id number]  Wyatt Haste, MD ED   01/26/2012  3:04 PM 01/29/2012  7:25 PM Full Code [id number]  Tacey Ruiz, RN Inpatient   01/22/2012  1:10 PM 01/26/2012  3:04 PM Full Code [id number]  Maggie Schwalbe, RN Inpatient      Disposition Plan: Likely will need rehabilitation  Consultants:  Cardiology  Antibiotics:  Levaquin  Time spent: 25 minutes  Alford Highland  [employer]

## 2016-02-13 LAB — GLUCOSE, CAPILLARY
GLUCOSE-CAPILLARY: 181 mg/dL — AB (ref 65–99)
Glucose-Capillary: 197 mg/dL — ABNORMAL HIGH (ref 65–99)

## 2016-02-13 LAB — PROCALCITONIN: Procalcitonin: <0.1 ng/mL

## 2016-02-13 LAB — HEMOGLOBIN A1C
HEMOGLOBIN A1C: 6.8 % — AB (ref 4.8–5.6)
Mean Plasma Glucose: 148 mg/dL

## 2016-02-13 MED ORDER — METOPROLOL TARTRATE 50 MG PO TABS: 50.0000 mg | ORAL_TABLET | Freq: Two times a day (BID) | ORAL | 0 refills | Status: AC

## 2016-02-13 MED ORDER — METHYLPREDNISOLONE SODIUM SUCC 40 MG IJ SOLR
20.0000 mg | Freq: Every day | INTRAMUSCULAR | Status: DC
  Administered 2016-02-13: 20 mg via INTRAVENOUS
  Filled 2016-02-13: qty 1

## 2016-02-13 MED ORDER — OXYCODONE HCL 5 MG PO TABS: 5.0000 mg | ORAL_TABLET | ORAL | 0 refills | Status: AC | PRN

## 2016-02-13 MED ORDER — PREDNISONE 5 MG PO TABS: ORAL_TABLET | ORAL | 0 refills | Status: AC

## 2016-02-13 MED ORDER — FENTANYL 25 MCG/HR TD PT72: 25.0000 ug | MEDICATED_PATCH | TRANSDERMAL | 0 refills | Status: AC

## 2016-02-13 MED ORDER — LEVOFLOXACIN 250 MG PO TABS: 250.0000 mg | ORAL_TABLET | Freq: Every day | ORAL | 0 refills | Status: AC

## 2016-02-13 MED ORDER — RISAQUAD PO CAPS: 1.0000 | ORAL_CAPSULE | Freq: Every day | ORAL | 0 refills | Status: AC

## 2016-02-13 MED ORDER — LIDOCAINE 5 % EX PTCH: 1.0000 | MEDICATED_PATCH | CUTANEOUS | 0 refills | Status: AC

## 2016-02-13 MED ORDER — FUROSEMIDE 20 MG PO TABS: 20.0000 mg | ORAL_TABLET | Freq: Every day | ORAL | 0 refills | Status: AC | PRN

## 2016-02-13 MED ORDER — ALBUTEROL SULFATE (2.5 MG/3ML) 0.083% IN NEBU: 2.5000 mg | INHALATION_SOLUTION | RESPIRATORY_TRACT | 12 refills | Status: AC

## 2016-02-13 MED ORDER — INSULIN ASPART 100 UNIT/ML ~~LOC~~ SOLN: SUBCUTANEOUS | 0 refills | Status: AC

## 2016-02-13 MED ORDER — IPRATROPIUM-ALBUTEROL 0.5-2.5 (3) MG/3ML IN SOLN
3.0000 mL | Freq: Three times a day (TID) | RESPIRATORY_TRACT | Status: DC
  Administered 2016-02-13: 3 mL via RESPIRATORY_TRACT
  Filled 2016-02-13: qty 3

## 2016-02-13 MED ORDER — ALPRAZOLAM 0.25 MG PO TABS: 0.2500 mg | ORAL_TABLET | Freq: Two times a day (BID) | ORAL | 0 refills | Status: AC

## 2016-02-13 MED ORDER — DOCUSATE SODIUM 100 MG PO CAPS: 100.0000 mg | ORAL_CAPSULE | Freq: Two times a day (BID) | ORAL | 0 refills | Status: AC | PRN

## 2016-02-13 NOTE — Care Management (Signed)
Received call from [place] with [facility] and  Trilogy has been approved

## 2016-02-13 NOTE — Progress Notes (Signed)
Per [place], CSW, [facility] is ready to accept patient and all paperwork is complete and in packet for EMS to take. Report called to [facility]. EMS called for transport. Daughter updated - she will meet them at the facility. IV and telemetry removed prior to leaving.

## 2016-02-13 NOTE — Care Management (Addendum)
Patient is for discharge today to skilled nursing today- [facility]. Informed CSW, will require bipap at the facility.  Attending has also completed order/referral for Trilogy through [facility] so can coordinate Trilogy when patient is able to discharge home.  Jill Alexanders - liaison for [employer] confirmed the referral is completed.  Daughter Gloris [place] is aware of the referral for the trilogy.  Agency will be able to service when patient moves back to [facility] with her daughter

## 2016-02-13 NOTE — Discharge Summary (Addendum)
[facility] [place] [facility]   PATIENT NAME: Melanie EkeBarbara Delacruz    MR#:  [id number]  DATE OF BIRTH:  Aug 22, 1976  DATE OF ADMISSION:  02/09/2016 ADMITTING PHYSICIAN: Wyatt Hasteavid K Hower, MD  DATE OF DISCHARGE: 02/13/2016  PRIMARY CARE PHYSICIAN: Hannah BeatSpencer Copland, MD    ADMISSION DIAGNOSIS:  Lower urinary tract infectious disease [N39.0] Weakness [R53.1]  DISCHARGE DIAGNOSIS:  Active Problems:   Acute on chronic respiratory failure (HCC)   Weakness   Hypercapnemia   Muscle weakness (generalized)   Unsteadiness on feet   SECONDARY DIAGNOSIS:   Past Medical History:  Diagnosis Date  . Adrenal mass (HCC)    a. Excision 2007.  [name]. Anemia, deficiency 07/01/2004   (w/u by Dr. Lorre NickGittin)  . Anxiety and depression   . Arteriovenous malformation of gastrointestinal tract   . C. difficile colitis    a. 04/2008 = severe.  [name]. CAD (coronary artery disease) 09/01/2002   a. s/p NSTEMI 2012. b. s/p CABG 01/2012.  [name]. CHF (congestive heart failure) (HCC)   . Chronic back pain   . Chronic back pain greater than 3 months duration 11/30/2014  . COPD (chronic obstructive pulmonary disease) (HCC) 05/31/2004  . Diabetes mellitus type II 07/31/1997  . diverticulosis of colon   . Fatty liver   . GI bleed    a. 04/2011 felt diverticular requiring microembolization by vascular.  [name]. Hx of CABG 04/18/2012  . Hyperlipemia 07/02/1999  . Hypertension 08/01/1995  . Osteoarthritis 04/03/1983  . Osteoporosis 12/31/2000  . PAF (paroxysmal atrial fibrillation) (HCC) 12/16/2013  . Restless leg syndrome   . Rheumatoid arthritis(714.0) 04/03/1983  . Splenic artery aneurysm (HCC)   . Status post replacement of both shoulder joints 02/09/2014    HOSPITAL COURSE:   1.  Acute on chronic hypercarbic and hypoxic respiratory failure.  Overnight oximetry desaturations into the 80s while on her usual oxygen. They should qualify her for noninvasive ventilation at night. At the rehabilitation we should be  able to obtain BiPAP with nasal pillows attachment with settings of 12/6. We have been doing auto here in the hospital. With the patient taking numerous pain medications it is essential to blow off carbon dioxide at night to avoid readmissions with altered mental status. Hopefully once out of the rehabilitation noninvasive ventilation can be continued at home. Ordering nocturnal and daytime volume ventilation home bipap insufficient due to the severity of condition. Patient chronically wears 2.5 L of oxygen 24/7.  2.COPD exacerbation. Finish up course of Levaquin. Solu-Medrol be switched over to prednisone taper upon discharge home. Continue nebulizer therapy and inhalers. 3. Hypokalemia. Potassium in the normal range. 4. Acute on chronic diastolic heart failure. Cardiology stop the Lasix at this point and will continue this as needed after rehabilitation for weight gain of 3 pounds or more or shortness of breath.if you do have to give Lasix may also have to give 20 mEq of potassium chloride also. 5. Weakness. Physical therapy recommended rehabilitation. 6. Chronic pain on fentanyl patch, when necessary oxycodone. Lidoderm patch trial. Patient also on Zanaflex at night. All of these medications can cause altered mental status, so it is essential to blow off  carbon dioxide at night with noninvasive ventilation. Careful with pain medications. 7. Hyperlipidemia unspecified on atorvastatin 8. Essential hypertension on amlodipine, ramipril and Lopressor 9. Type 2 diabetes mellitus. Sugars were high while on steroids. I did have to give Lantus while here, but hopefully as we Taper steroidswe can just go with sliding scale insulin.  DISCHARGE CONDITIONS:   satisfactory  CONSULTS OBTAINED:  Treatment Team:  Wyatt Haste, MD Antonieta Iba, MD  DRUG ALLERGIES:   Allergies  Allergen Reactions  . Other Diarrhea, Rash and Other (See Comments)    Pt states that she is allergic to all -mycins.    Reaction: Sore tongue, raw mouth, and weakness  . Penicillins Other (See Comments)    Reaction:  Dizziness  Has patient had a PCN reaction causing immediate rash, facial/tongue/throat swelling, SOB or lightheadedness with hypotension: No Has patient had a PCN reaction causing severe rash involving mucus membranes or skin necrosis: No Has patient had a PCN reaction that required hospitalization No Has patient had a PCN reaction occurring within the last 10 years: No If all of the above answers are "NO", then may proceed with Cephalosporin use.  . Sulfa Antibiotics Shortness Of Breath  . Morphine Sulfate Nausea And Vomiting and Other (See Comments)    Reaction:  Hallucinations   . Macrolides And Ketolides Diarrhea and Nausea And Vomiting  . Iron Diarrhea  . Naproxen Rash    DISCHARGE MEDICATIONS:   Current Discharge Medication List    START taking these medications   Details  insulin aspart (NOVOLOG) 100 UNIT/ML injection Check fingerstick qac and qhs.  For Glucose of 151 to 200 give 3 units subcutaneous injection; 201-250 give 5 units; 251-300 give 7 units; 301-350 give 9 units; 351 to 400 give 11 units; greater than 401 give 15 units Qty: 10 mL, Refills: 0    levofloxacin (LEVAQUIN) 250 MG tablet Take 1 tablet (250 mg total) by mouth daily. Qty: 3 tablet, Refills: 0    lidocaine (LIDODERM) 5 % Place 1 patch onto the skin daily. Remove & Discard patch within 12 hours or as directed by MD Qty: 30 patch, Refills: 0    predniSONE (DELTASONE) 5 MG tablet 4 tabs po day1; 3 tabs po day2; 2 tabs po day3,4; 1 tab po day5,6 Qty: 13 tablet, Refills: 0      CONTINUE these medications which have CHANGED   Details  acidophilus (RISAQUAD) CAPS capsule Take 1 capsule by mouth daily. Qty: 30 capsule, Refills: 0    albuterol (PROVENTIL) (2.5 MG/3ML) 0.083% nebulizer solution Take 3 mLs (2.5 mg total) by nebulization every 4 (four) hours. While awake Qty: 75 mL, Refills: 12    ALPRAZolam  (XANAX) 0.25 MG tablet Take 1 tablet (0.25 mg total) by mouth 2 (two) times daily. Qty: 40 tablet, Refills: 0    docusate sodium (COLACE) 100 MG capsule Take 1 capsule (100 mg total) by mouth 2 (two) times daily as needed for mild constipation. Qty: 60 capsule, Refills: 0    fentaNYL (DURAGESIC - DOSED MCG/HR) 25 MCG/HR patch Place 1 patch (25 mcg total) onto the skin every 3 (three) days. Qty: 10 patch, Refills: 0    furosemide (LASIX) 20 MG tablet Take 1 tablet (20 mg total) by mouth daily as needed. For shortness of breath or 3 lb weight gain Qty: 30 tablet, Refills: 0    metoprolol (LOPRESSOR) 50 MG tablet Take 1 tablet (50 mg total) by mouth 2 (two) times daily. Qty: 60 tablet, Refills: 0    oxyCODONE (OXY IR/ROXICODONE) 5 MG immediate release tablet Take 1 tablet (5 mg total) by mouth every 4 (four) hours as needed for moderate pain or severe pain (breakthrough pain). Qty: 30 tablet, Refills: 0      CONTINUE these medications which have NOT CHANGED   Details  amLODipine (  NORVASC) 5 MG tablet TAKE 1 TABLET(5 MG) BY MOUTH DAILY Qty: 30 tablet, Refills: 4    aspirin EC 81 MG tablet Take 1 tablet (81 mg total) by mouth daily. Qty: 90 tablet, Refills: 3   Associated Diagnoses: Other and unspecified hyperlipidemia; CAD (coronary artery disease); Unspecified essential hypertension    atorvastatin (LIPITOR) 40 MG tablet TAKE 1 TABLET(40 MG) BY MOUTH DAILY Qty: 30 tablet, Refills: 6    Fluticasone-Salmeterol (ADVAIR DISKUS) 500-50 MCG/DOSE AEPB Inhale 1 puff into the lungs 2 (two) times daily. Qty: 60 each, Refills: 11    nitroGLYCERIN (NITROSTAT) 0.4 MG SL tablet Place 0.4 mg under the tongue every 5 (five) minutes as needed for chest pain.    ramipril (ALTACE) 5 MG capsule Take 2 capsules (10 mg total) by mouth at bedtime. Qty: 60 capsule, Refills: 5    tiotropium (SPIRIVA) 18 MCG inhalation capsule Place 1 capsule (18 mcg total) into inhaler and inhale daily. Qty: 30 capsule,  Refills: 12    Melatonin 5 MG TABS Take 5 mg by mouth at bedtime as needed (for sleep).     Omega-3 Fatty Acids (FISH OIL) 1000 MG CAPS Take 1,000 mg by mouth daily.     tiZANidine (ZANAFLEX) 4 MG tablet Take 2-4 mg by mouth at bedtime as needed for muscle spasms.      STOP taking these medications     diltiazem (CARDIZEM) 60 MG tablet      morphine (MS CONTIN) 30 MG 12 hr tablet      VITAMIN E PO      ipratropium-albuterol (DUONEB) 0.5-2.5 (3) MG/3ML SOLN          DISCHARGE INSTRUCTIONS:   Follow-up with Dr. Rehabilitation 1 day  If you experience worsening of your admission symptoms, develop shortness of breath, life threatening emergency, suicidal or homicidal thoughts you must seek medical attention immediately by calling 911 or calling your MD immediately  if symptoms less severe.  You Must read complete instructions/literature along with all the possible adverse reactions/side effects for all the Medicines you take and that have been prescribed to you. Take any new Medicines after you have completely understood and accept all the possible adverse reactions/side effects.   Please note  You were cared for by a hospitalist during your hospital stay. If you have any questions about your discharge medications or the care you received while you were in the hospital after you are discharged, you can call the unit and asked to speak with the hospitalist on call if the hospitalist that took care of you is not available. Once you are discharged, your primary care physician will handle any further medical issues. Please note that NO REFILLS for any discharge medications will be authorized once you are discharged, as it is imperative that you return to your primary care physician (or establish a relationship with a primary care physician if you do not have one) for your aftercare needs so that they can reassess your need for medications and monitor your lab values.    Today   CHIEF  COMPLAINT:   Chief Complaint  Patient presents with  . Weakness    HISTORY OF PRESENT ILLNESS:  Jaliah Foody  is a 82 y.o. female presented with weakness and altered mental status   VITAL SIGNS:  Blood pressure (!) 155/55, pulse 67, temperature 97.8 F (36.6 C), temperature source Oral, resp. rate 18, height 5' (1.524 m), weight 61.5 kg (135 lb 8 oz), SpO2 92 %.  PHYSICAL EXAMINATION:  GENERAL:  82 y.o.-year-old patient lying in the bed with no acute distress.  EYES: Pupils equal, round, reactive to light and accommodation. No scleral icterus. Extraocular muscles intact.  HEENT: Head atraumatic, normocephalic. Oropharynx and nasopharynx clear.  NECK:  Supple, no jugular venous distention. No thyroid enlargement, no tenderness.  LUNGS: decreased breath sounds bilaterally, no wheezing, rales,rhonchi or crepitation. No use of accessory muscles of respiration.  CARDIOVASCULAR: S1, S2 normal. No murmurs, rubs, or gallops.  ABDOMEN: Soft, non-tender, non-distended. Bowel sounds present. No organomegaly or mass.  EXTREMITIES: No pedal edema, cyanosis, or clubbing.  NEUROLOGIC: Cranial nerves II through XII are intact. Muscle strength 5/5 in all extremities. Sensation intact. Gait not checked.  PSYCHIATRIC: The patient is alert and oriented x 3.  SKIN: No obvious rash, lesion, or ulcer.   DATA REVIEW:   CBC  Recent Labs Lab 02/11/16 0439  WBC 14.3*  HGB 11.4*  HCT 35.1  PLT 392    Chemistries   Recent Labs Lab 02/10/16 0434  02/12/16 0812  NA 138  < > 133*  K 2.9*  < > 4.6  CL 89*  < > 94*  CO2 43*  < > 32  GLUCOSE 157*  < > 268*  BUN 16  < > 43*  CREATININE 0.62  < > 0.88  CALCIUM 8.8*  < > 8.9  MG 2.0  --   --   < > = values in this interval not displayed.  Cardiac Enzymes  Recent Labs Lab 02/09/16 1734  TROPONINI 0.04*    Microbiology Results  Results for orders placed or performed during the hospital encounter of 02/09/16  Urine culture     Status:  Abnormal   Collection Time: 02/09/16  5:16 PM  Result Value Ref Range Status   Specimen Description URINE, CATHETERIZED  Final   Special Requests NONE  Final   Culture MULTIPLE SPECIES PRESENT, SUGGEST RECOLLECTION (A)  Final   Report Status 02/11/2016 FINAL  Final  Culture, blood (routine x 2)     Status: None (Preliminary result)   Collection Time: 02/09/16  7:56 PM  Result Value Ref Range Status   Specimen Description BLOOD RIGHT ARM  Final   Special Requests BOTTLES DRAWN AEROBIC AND ANAEROBIC 5CC  Final   Culture NO GROWTH 4 DAYS  Final   Report Status PENDING  Incomplete  Culture, blood (routine x 2)     Status: None (Preliminary result)   Collection Time: 02/09/16  8:26 PM  Result Value Ref Range Status   Specimen Description BLOOD RIGHT ARM  Final   Special Requests BOTTLES DRAWN AEROBIC AND ANAEROBIC 5CC  Final   Culture NO GROWTH 4 DAYS  Final   Report Status PENDING  Incomplete      Management plans discussed with the patient, family and they are in agreement.  CODE STATUS:     Code Status Orders        Start     Ordered   02/09/16 2013  Full code  Continuous     02/09/16 2012    Code Status History    Date Active Date Inactive Code Status Order ID Comments User Context   02/09/2016  7:45 PM 02/09/2016  8:12 PM DNR [id number]  Wyatt Haste, MD ED   04/20/2015  9:14 PM 04/22/2015  6:32 PM DNR [id number]  Alford Highland, MD ED   04/05/2015 10:42 PM 04/07/2015  4:50 PM Full Code [id number]  Wyatt Haste, MD ED  01/26/2012  3:04 PM 01/29/2012  7:25 PM Full Code [id number]  Tacey Ruiz, RN Inpatient   01/22/2012  1:10 PM 01/26/2012  3:04 PM Full Code [id number]  Maggie Schwalbe, RN Inpatient      TOTAL TIME TAKING CARE OF THIS PATIENT: 35 minutes.    Alford Highland M.D on 02/13/2016 at 8:39 AM  Between 7am to 6pm - Pager - [phone]  After 6pm go to [email or web address] - password [email or web address]  [facility] Office  [phone]  CC: Primary care  physician; Hannah Beat, MD

## 2016-02-13 NOTE — Care Management Important Message (Signed)
Important Message  Patient Details  Name: [name] MRN: [id number] Date of Birth: [birth year]   Medicare Important Message Given:  Yes    Eber Hong, RN 02/13/2016, 8:40 AM

## 2016-02-14 LAB — CULTURE, BLOOD (ROUTINE X 2)
CULTURE: NO GROWTH
Culture: NO GROWTH

## 2016-02-16 ENCOUNTER — Other Ambulatory Visit: Payer: Self-pay | Admitting: *Deleted

## 2016-02-16 NOTE — Patient Outreach (Signed)
[facility]) Care Management  02/16/2016  [name] [birth year] [id number]   Met with patient at bedside. Patient states that she lives with her daughter in [place]. Daughter is [name] [phone]  RNCM confirmed with SW that patient lives in [place]. Will follow up later with patient daughter for any [facility] care management needs.  [name]. Laymond Purser, RN, BSN, CCM  [facility] Post-acute care coordinator [phone]

## 2016-02-21 ENCOUNTER — Telehealth: Payer: Self-pay | Admitting: Family Medicine

## 2016-02-21 NOTE — Telephone Encounter (Signed)
LVM for pt to call back and schedule AWV + labs with PCP. °

## 2016-03-09 ENCOUNTER — Other Ambulatory Visit: Payer: Self-pay | Admitting: *Deleted

## 2016-03-09 NOTE — Patient Outreach (Signed)
Call attempted to patient daughter Gloris Manchester at number given to [facility]. Message states number has been changed or not in servcie. Plan to attempt to find new contact number from patient or facility at next encounter. Alben Spittle. Niemczura, RN, BSN, CCM [facility] Post Acute Care Coordination [phone]

## 2016-03-14 NOTE — Telephone Encounter (Signed)
Pt is being discharged tomorrow 03/15/16 form [facility] in [place]; she is sch w/Dr. Kennedy Buckerousseau on 03/28/16 for a followup; they will fax the notes to our office

## 2016-03-15 NOTE — Telephone Encounter (Signed)
Tried to call patient no answer

## 2016-03-28 ENCOUNTER — Ambulatory Visit: Admit: 2016-03-28 | Discharge: 2016-03-28 | Payer: MEDICARE | Attending: Internal Medicine | Primary: Internal Medicine

## 2016-03-28 DIAGNOSIS — J449 Chronic obstructive pulmonary disease, unspecified: Secondary | ICD-10-CM

## 2016-03-28 LAB — AMB POC URINALYSIS DIP STICK AUTO W/O MICRO
Bilirubin (UA POC): NEGATIVE
Glucose (UA POC): NEGATIVE
Ketones (UA POC): NEGATIVE
Nitrites (UA POC): POSITIVE
Specific gravity (UA POC): 1.02 (ref 1.001–1.035)
Urobilinogen (UA POC): 0.2 (ref 0.2–1)
pH (UA POC): 5 (ref 4.6–8.0)

## 2016-03-28 MED ORDER — CEFPODOXIME 100 MG TAB
100 mg | ORAL_TABLET | Freq: Two times a day (BID) | ORAL | 0 refills | Status: AC
Start: 2016-03-28 — End: 2016-04-04

## 2016-03-28 NOTE — Patient Instructions (Signed)
Take meds as instructed.

## 2016-03-28 NOTE — Progress Notes (Signed)
HISTORY OF PRESENT ILLNESS  Shelby Mora  is a 82 y.o. female     Fatigue-   Was very fatigued, after pain mgmt dec pain meds.  Went to hospital, dx with UTI, elevated C02.  Started bipap but she won't use this.  Went to rehab and has been home 1 week.     Right hand swelling.  Daughter reports she lays on that hand constantly.  US has been negative. No injury.     Low K--  Not on potassium now.          Fatigue   The history is provided by the patient. This is a chronic problem. Pertinent negatives include no chest pain, no abdominal pain, no headaches and no shortness of breath.         Review of Systems   Constitutional: Positive for fatigue and malaise/fatigue. Negative for fever and weight loss.   HENT: Negative for hearing loss.    Eyes: Negative for blurred vision.   Respiratory: Negative for shortness of breath.    Cardiovascular: Negative for chest pain and leg swelling.   Gastrointestinal: Negative for abdominal pain and nausea.   Genitourinary: Positive for dysuria.   Musculoskeletal: Negative for myalgias.   Skin: Negative for rash.   Neurological: Negative for dizziness and headaches.   Endo/Heme/Allergies: Does not bruise/bleed easily.   Psychiatric/Behavioral: Negative for depression.         Physical Exam   HENT:   Mouth/Throat: No oropharyngeal exudate.   Eyes: No scleral icterus.   Neck: No thyromegaly present.   Cardiovascular: Exam reveals no gallop and no friction rub.    No murmur heard.  Pulmonary/Chest: No respiratory distress. She has wheezes. She has no rales.   And rhonchi throughout   Abdominal: She exhibits no distension. There is no tenderness. There is no rebound.   Musculoskeletal: She exhibits edema.   Right hand swelling   Skin: No rash noted.   Psychiatric: Affect normal.       ICD-10-CM ICD-9-CM    1. Advanced COPD (HCC) J44.9 496    2. Chronic respiratory failure with hypoxia (HCC) J96.11 518.83      799.02    3. Hypokalemia E87.6 276.8 RENAL FUNCTION PANEL    4. Essential hypertension I10 401.9    5. Urinary tract infection without hematuria, site unspecified N39.0 599.0 AMB POC URINALYSIS DIP STICK AUTO W/O MICRO      CULTURE, URINE      cefpodoxime (VANTIN) 100 mg tablet   6. Edema, unspecified type R60.9 782.3      Advanced COPD- encouraged to use bipap.  Can send in hospital bed order   Check K+-- not supplementing currrently  bp controlled  UTI --treat. Culture.    Right hand edema- by report US negative. RSD?, compression? Elevated arm.      Poor prognosis. Continue home health

## 2016-03-29 ENCOUNTER — Telehealth

## 2016-03-29 LAB — RENAL FUNCTION PANEL
Albumin: 3.6 g/dL (ref 3.5–4.7)
BUN/Creatinine ratio: 24 (ref 12–28)
BUN: 20 mg/dL (ref 8–27)
CO2: 33 mmol/L — ABNORMAL HIGH (ref 18–29)
Calcium: 9.4 mg/dL (ref 8.7–10.3)
Chloride: 91 mmol/L — ABNORMAL LOW (ref 96–106)
Creatinine: 0.84 mg/dL (ref 0.57–1.00)
GFR est AA: 75 mL/min/{1.73_m2} (ref >59)
GFR est non-AA: 65 mL/min/{1.73_m2} (ref >59)
Glucose: 163 mg/dL — ABNORMAL HIGH (ref 65–99)
Phosphorus: 3.8 mg/dL (ref 2.5–4.5)
Potassium: 3.4 mmol/L — ABNORMAL LOW (ref 3.5–5.2)
Sodium: 143 mmol/L (ref 134–144)

## 2016-03-29 MED ORDER — POTASSIUM CHLORIDE SR 20 MEQ TAB, PARTICLES/CRYSTALS
20 mEq | ORAL_TABLET | Freq: Every day | ORAL | 11 refills | Status: AC
Start: 2016-03-29 — End: ?

## 2016-03-29 NOTE — Telephone Encounter (Signed)
Potassium on lower side.  Would take 20 meq KCL

## 2016-03-30 NOTE — Telephone Encounter (Signed)
Spoke with patients daughter, she states understanding

## 2016-04-01 LAB — CULTURE, URINE

## 2016-04-03 MED ORDER — OTHER
0 refills | Status: DC
Start: 2016-04-03 — End: 2016-08-07

## 2016-04-03 NOTE — Telephone Encounter (Signed)
Requested Prescriptions     Pending Prescriptions Disp Refills   ??? OTHER 1 Units 0     Sig: Hospital bed   ??? OTHER 1 Units 0     Sig: 3 in 1 toilet chair     Will fax to [phone]

## 2016-04-03 NOTE — Telephone Encounter (Signed)
Daughter called and left a message stating that [employer] are waiting on the fax rx for her mother to get a hospital bed and a portable toilet. [facility] [phone]

## 2016-04-03 NOTE — Progress Notes (Signed)
Treated with vantin.  Susceptible.

## 2016-04-13 ENCOUNTER — Encounter

## 2016-04-13 MED ORDER — RAMIPRIL 10 MG CAP
10 mg | ORAL_CAPSULE | Freq: Every day | ORAL | 3 refills | Status: AC
Start: 2016-04-13 — End: ?

## 2016-04-13 MED ORDER — ALPRAZOLAM 0.25 MG TAB
0.25 mg | ORAL_TABLET | Freq: Two times a day (BID) | ORAL | 0 refills | Status: DC
Start: 2016-04-13 — End: 2016-05-28

## 2016-04-13 MED ORDER — ATORVASTATIN 40 MG TAB
40 mg | ORAL_TABLET | Freq: Every day | ORAL | 3 refills | Status: AC
Start: 2016-04-13 — End: ?

## 2016-04-13 MED ORDER — FUROSEMIDE 20 MG TAB
20 mg | ORAL_TABLET | Freq: Every day | ORAL | 3 refills | Status: AC
Start: 2016-04-13 — End: ?

## 2016-04-13 MED ORDER — DULOXETINE 30 MG CAP, DELAYED RELEASE
30 mg | ORAL_CAPSULE | Freq: Every day | ORAL | 3 refills | Status: AC
Start: 2016-04-13 — End: ?

## 2016-04-13 NOTE — Telephone Encounter (Signed)
Spoke with Lupita Leashonna, patient has Medicare as primary,[employer] as secondary, they do not bill medicare part B, looked [place] [email or web address], found a local supplier awarded bid for bed, commode in patients area, spoke with Reann at [employer], she will fax over a form, they need chart notes, prescription, insurance cards faxed to [phone], patients daughter is aware that I will be out of the office and if anything else is needed to contact the office and [facility]

## 2016-04-13 NOTE — Telephone Encounter (Signed)
Patients daughter called to check on the status of a hospital bed and bedside toilet chair, called [facility] and spoke with donna, she will call back with more information

## 2016-04-13 NOTE — Telephone Encounter (Signed)
I have discussed with my authorized delegate who has reviewed the patient's controlled substance history, as maintained in the [place] prescription monitoring program, so that the prescription(s) for a controlled substances can be given.

## 2016-04-13 NOTE — Telephone Encounter (Signed)
Requested Prescriptions     Pending Prescriptions Disp Refills   ??? ramipril (ALTACE) 10 mg capsule 90 Cap 3     Sig: Take 1 Cap by mouth daily.   ??? ALPRAZolam (XANAX) 0.25 mg tablet 60 Tab 0     Sig: Take 1 Tab by mouth two (2) times a day. Max Daily Amount: 0.5 mg.   ??? DULoxetine (CYMBALTA) 30 mg capsule 90 Cap 3     Sig: Take 1 Cap by mouth daily.   ??? atorvastatin (LIPITOR) 40 mg tablet 90 Tab 3     Sig: Take 1 Tab by mouth daily.   ??? furosemide (LASIX) 20 mg tablet 90 Tab 3     Sig: Take 1 Tab by mouth daily.     dhec report was ran last refill 02/03/2016

## 2016-04-19 NOTE — Telephone Encounter (Signed)
Ok to bring specimen only - is this for a recheck?

## 2016-04-19 NOTE — Telephone Encounter (Signed)
Patient daughter called and states that she could bring her mother's urine specimen instead of bringing her mother.   She was told to bring her mother earlier by front office stuff.  Requesting an advice.

## 2016-04-20 NOTE — Telephone Encounter (Signed)
Will need to be seen

## 2016-04-20 NOTE — Telephone Encounter (Signed)
Not recheck

## 2016-04-23 NOTE — Telephone Encounter (Signed)
Informed.

## 2016-04-26 NOTE — Telephone Encounter (Signed)
Daughter called and following up on prescription for hospital bed commode.    Requesting a call back.

## 2016-04-26 NOTE — Telephone Encounter (Signed)
Lm informing patient is on vacation and will not be until Monday 29th

## 2016-04-27 NOTE — Telephone Encounter (Signed)
Daughter called and wanting to know the name of supplier which hospital bed was sent to .   Requesting a call back

## 2016-05-02 MED ORDER — METOPROLOL TARTRATE 50 MG TAB
50 mg | ORAL_TABLET | Freq: Two times a day (BID) | ORAL | 3 refills | Status: AC
Start: 2016-05-02 — End: ?

## 2016-05-02 NOTE — Telephone Encounter (Signed)
Can you discuss zanaflex with daughter-  Thought she saw pain mgmt??

## 2016-05-02 NOTE — Telephone Encounter (Signed)
.    Requested Prescriptions     Pending Prescriptions Disp Refills   ??? metoprolol tartrate (LOPRESSOR) 50 mg tablet 90 Tab 3     Sig: Take 1 Tab by mouth two (2) times a day.   ??? tiZANidine (ZANAFLEX) 2 mg tablet 90 Tab 3     Sig: Take 1 Tab by mouth two (2) times a day.

## 2016-05-07 MED ORDER — METFORMIN SR 750 MG 24 HR TABLET
750 mg | ORAL_TABLET | Freq: Every day | ORAL | 3 refills | Status: DC
Start: 2016-05-07 — End: 2016-06-06

## 2016-05-07 NOTE — Telephone Encounter (Signed)
Lm for patient daughter to called office back regarding patient med.

## 2016-05-07 NOTE — Telephone Encounter (Signed)
Requested Prescriptions     Pending Prescriptions Disp Refills   ??? metFORMIN ER (GLUCOPHAGE XR) 750 mg tablet 90 Tab 3     Sig: Take 1 Tab by mouth daily.

## 2016-05-07 NOTE — Telephone Encounter (Signed)
Requesting refills on Metformin and tizanidine.

## 2016-05-09 ENCOUNTER — Encounter: Payer: MEDICARE | Attending: Internal Medicine | Primary: Internal Medicine

## 2016-05-28 ENCOUNTER — Ambulatory Visit: Admit: 2016-05-28 | Discharge: 2016-05-28 | Payer: MEDICARE | Attending: Internal Medicine | Primary: Internal Medicine

## 2016-05-28 DIAGNOSIS — J449 Chronic obstructive pulmonary disease, unspecified: Secondary | ICD-10-CM

## 2016-05-28 MED ORDER — ALPRAZOLAM 0.25 MG TAB
0.25 mg | ORAL_TABLET | Freq: Two times a day (BID) | ORAL | 0 refills | Status: DC
Start: 2016-05-28 — End: 2016-07-05

## 2016-05-28 MED ORDER — HYDROCORTISONE 2.5 % RECTAL CREAM
2.5 % | Freq: Four times a day (QID) | CUTANEOUS | 4 refills | Status: AC
Start: 2016-05-28 — End: ?

## 2016-05-28 NOTE — Patient Instructions (Signed)
Take meds as instructed.

## 2016-05-28 NOTE — Progress Notes (Signed)
HISTORY OF PRESENT ILLNESS  Tessie EkeBarbara Lerette  is a 83 y.o. female     Hemorrhoids-  Having blood on toilet paper.  No sig bleeding.      COPD- wears oxygen here.   Daughter would like FMLA filled out.    H/o of IDA- last hb low    ptt still feels like has UTI but cannot give urine sample.  d      Hemorrhoids   The history is provided by the patient. This is a chronic problem. Associated symptoms include shortness of breath. Pertinent negatives include no chest pain, no abdominal pain and no headaches.         Review of Systems   Constitutional: Negative for fever and weight loss.   HENT: Negative for hearing loss.    Eyes: Negative for blurred vision.   Respiratory: Positive for shortness of breath.    Cardiovascular: Negative for chest pain and leg swelling.   Gastrointestinal: Positive for hemorrhoids. Negative for abdominal pain and nausea.   Genitourinary: Positive for dysuria. Negative for urgency.   Musculoskeletal: Negative for myalgias.   Skin: Negative for rash.   Neurological: Negative for dizziness and headaches.   Endo/Heme/Allergies: Does not bruise/bleed easily.   Psychiatric/Behavioral: Negative for depression.         Physical Exam   HENT:   Mouth/Throat: No oropharyngeal exudate.   Eyes: No scleral icterus.   Neck: No thyromegaly present.   Cardiovascular: Exam reveals no gallop and no friction rub.    No murmur heard.  Pulmonary/Chest: No respiratory distress. She has no wheezes. She has rales.   Dec b/s throughout   Abdominal: She exhibits no distension. There is no tenderness. There is no rebound.   Large hemorrhod--- not thrombosed   Musculoskeletal: She exhibits no edema.   Lymphadenopathy:     She has no cervical adenopathy.   Skin: No rash noted.   Psychiatric: Affect normal.       ICD-10-CM ICD-9-CM    1. Advanced COPD (HCC) J44.9 496    2. Anxiety F41.9 300.00 ALPRAZolam (XANAX) 0.25 mg tablet   3. Chronic respiratory failure with hypoxia (HCC) J96.11 518.83      799.02     4. Essential hypertension I10 401.9    5. Other iron deficiency anemia D50.8 280.8    6. 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 Hemorrhoids, unspecified hemorrhoid type K64.9 455.6 hydrocortisone (ANUSOL-HC) 2.5 % rectal cream   8. Burning with urination R30.0 788.1 CULTURE, URINE     Chronic resp failure 2nd to cOPD-chronic o2  Treat anxiety  bp uncontrolled. Bring meds to next appt.  Needs home health  IDA- check Hb  Diarrhea-- 2-3 days== possible viral? Patient to RTO with fever, increasing  pain, and/or worsening symptoms.     Still w/ dysuria-= could not given sample. Can return to give sample    Greater than 50% of this 45 minutes visit was spent counseling the patient.

## 2016-05-29 LAB — CBC WITH AUTOMATED DIFF
ABS. BASOPHILS: 0 10*3/uL (ref 0.0–0.2)
ABS. EOSINOPHILS: 0.3 10*3/uL (ref 0.0–0.4)
ABS. IMM. GRANS.: 0 10*3/uL (ref 0.0–0.1)
ABS. MONOCYTES: 0.8 10*3/uL (ref 0.1–0.9)
ABS. NEUTROPHILS: 6.6 10*3/uL (ref 1.4–7.0)
Abs Lymphocytes: 2.9 10*3/uL (ref 0.7–3.1)
BASOPHILS: 0 %
EOSINOPHILS: 3 %
HCT: 29.3 % — ABNORMAL LOW (ref 34.0–46.6)
HGB: 9.1 g/dL — ABNORMAL LOW (ref 11.1–15.9)
IMMATURE GRANULOCYTES: 0 %
Lymphocytes: 27 %
MCH: 26.8 pg (ref 26.6–33.0)
MCHC: 31.1 g/dL — ABNORMAL LOW (ref 31.5–35.7)
MCV: 86 fL (ref 79–97)
MONOCYTES: 8 %
NEUTROPHILS: 62 %
PLATELET: 273 10*3/uL (ref 150–379)
RBC: 3.4 x10E6/uL — ABNORMAL LOW (ref 3.77–5.28)
RDW: 15.4 % (ref 12.3–15.4)
WBC: 10.6 10*3/uL (ref 3.4–10.8)

## 2016-05-29 NOTE — Telephone Encounter (Signed)
Lm informing pt daughter her forms are ready for pick up as per Dr Kennedy Buckerousseau.

## 2016-05-31 NOTE — Telephone Encounter (Signed)
Pt anemic.  Hb 9.1.  Nl MCV.  Seeing in 1 mo.  Check iron levels, b12 next visit. Spoke w daughter.  No melena, brbr.  Also could Anemia of Chronic disease.  No CKD

## 2016-06-06 ENCOUNTER — Ambulatory Visit: Admit: 2016-06-06 | Discharge: 2016-06-06 | Payer: MEDICARE | Attending: Internal Medicine | Primary: Internal Medicine

## 2016-06-06 DIAGNOSIS — R197 Diarrhea, unspecified: Secondary | ICD-10-CM

## 2016-06-06 NOTE — Telephone Encounter (Signed)
Pt Daughter called to request new referral to [facility], as prior referral has expired.

## 2016-06-06 NOTE — Telephone Encounter (Signed)
A user error has taken place: encounter opened in error, closed for administrative reasons.

## 2016-06-06 NOTE — Patient Instructions (Signed)
Take meds as instructed.

## 2016-06-06 NOTE — Telephone Encounter (Signed)
Spoke with daughter.   Patent c/o loose stool with abdominal cramp since last weekend. Has been on brand diet and took OTC diarrhea medication.     Suggested to schedule an office visit to evaluate loose stool and needs of hospital bed and toilet seat.    Daughter agreed and scheduled for office visit.

## 2016-06-06 NOTE — Progress Notes (Signed)
HISTORY OF PRESENT ILLNESS  Shelby Mora  is a 83 y.o. female     Diarrhea-  Still having loose stools.  Settled down for diarrhea.  Not able to make it to bathroom.  On cymbalta since November.  Been on metformin for many years.        Diarrhea    The history is provided by the patient. This is a chronic problem. The problem has not changed since onset.There has been no fever. Pertinent negatives include no abdominal pain, no headaches and no myalgias.       Review of Systems   Constitutional: Negative for fever and weight loss.   HENT: Negative for hearing loss.    Eyes: Negative for blurred vision.   Respiratory: Negative for shortness of breath.    Cardiovascular: Negative for chest pain and leg swelling.   Gastrointestinal: Positive for diarrhea. Negative for abdominal pain, blood in stool, constipation, melena and nausea.   Genitourinary: Negative for dysuria.   Musculoskeletal: Negative for myalgias.   Skin: Negative for rash.   Neurological: Negative for dizziness and headaches.   Endo/Heme/Allergies: Does not bruise/bleed easily.   Psychiatric/Behavioral: Negative for depression.       Physical Exam   HENT:   Mouth/Throat: No oropharyngeal exudate.   Eyes: No scleral icterus.   Neck: No thyromegaly present.   Cardiovascular: Normal rate and regular rhythm.  Exam reveals no gallop and no friction rub.    No murmur heard.  Pulmonary/Chest: No respiratory distress. She has no wheezes. She has no rales.   Abdominal: She exhibits no distension. There is no tenderness. There is no rebound.   Musculoskeletal: She exhibits no edema.   Lymphadenopathy:     She has no cervical adenopathy.   Skin: No rash noted.   Psychiatric: Affect normal.         ICD-10-CM ICD-9-CM    1. Diarrhea, unspecified type R19.7 787.91 CULTURE, STOOL      C DIFFICILE TOXIN A & B BY EIA   2. Advanced COPD (HCC) J44.9 496    3. Incontinence of feces, unspecified fecal incontinence type R15.9 787.60       Diarrhea persists, waxing and waning.  Clearly has component of fecal incontinence as well.  Check stool cutlures since much of this new over last month.  Last a1c 5.9. Stop metformin as could be contributing.  See in 2-3 [place].  To bring stool cultures back before    If incontinence persists with normal stools- will need to see GI.      Guarded prognosis overall.

## 2016-06-06 NOTE — Telephone Encounter (Signed)
Daughter would like to know information about hospital bed and toilet seat.

## 2016-06-06 NOTE — Telephone Encounter (Signed)
Requested Prescriptions     Pending Prescriptions Disp Refills   ??? OTHER 1 Units 0     Sig: Hospital bed   ??? OTHER 1 Units 0     Sig: 3 in 1 toilet chair       they need chart notes, prescription, insurance cards faxed to [phone],  Fax to [facility]

## 2016-06-06 NOTE — Telephone Encounter (Signed)
Can you look into this.  I just placed this referral less than 2 Rud[place] ago??

## 2016-06-07 NOTE — Telephone Encounter (Signed)
Reopened an referral. Seems like daughter has never called HH back to scheduled an appointment.     Dr. Kennedy Buckerousseau verbally informed.

## 2016-06-11 NOTE — Telephone Encounter (Signed)
Patient relative called and states that patient still hasn't got hospital bed and would like to know status of it.     Supplier [facility]. They are stating that they don't have any records of receiving anything for this patient.

## 2016-06-11 NOTE — Telephone Encounter (Signed)
A user error has taken place: encounter opened in error, closed for administrative reasons.

## 2016-06-18 NOTE — Telephone Encounter (Signed)
Hospital d/c 06/18/16   [facility] d/c 3/19, appt made for 3/27

## 2016-06-19 NOTE — Telephone Encounter (Signed)
Tried to call patient to check status, no answer

## 2016-06-20 ENCOUNTER — Encounter: Primary: Internal Medicine

## 2016-06-24 ENCOUNTER — Encounter: Primary: Internal Medicine

## 2016-06-26 ENCOUNTER — Ambulatory Visit: Admit: 2016-06-26 | Discharge: 2016-06-26 | Payer: MEDICARE | Attending: Internal Medicine | Primary: Internal Medicine

## 2016-06-26 DIAGNOSIS — J441 Chronic obstructive pulmonary disease with (acute) exacerbation: Secondary | ICD-10-CM

## 2016-06-26 MED ORDER — PREDNISONE 10 MG TAB
10 mg | ORAL_TABLET | ORAL | 0 refills | Status: DC
Start: 2016-06-26 — End: 2016-08-07

## 2016-06-26 NOTE — Patient Instructions (Signed)
Take meds as instructed.

## 2016-06-26 NOTE — Progress Notes (Signed)
HISTORY OF PRESENT ILLNESS  Shelby Mora  is a 83 y.o. female     Cough-  Coughing for several days.  Productive cough and increasing shortness of breath.  No fever, chills.      Diarrhea-  Went into hospital for diarrhea--treated for diarrhea.  No more sig diarrhea.    Was also anemic.  Has stopped metformin.      Cough   The history is provided by the patient. This is a chronic problem. Associated symptoms include shortness of breath. Pertinent negatives include no chest pain, no abdominal pain and no headaches.         Review of Systems   Constitutional: Negative for fever and weight loss.   HENT: Negative for hearing loss.    Eyes: Negative for blurred vision.   Respiratory: Positive for cough, sputum production and shortness of breath.    Cardiovascular: Negative for chest pain and leg swelling.   Gastrointestinal: Negative for abdominal pain and nausea.   Genitourinary: Negative for dysuria.   Musculoskeletal: Negative for myalgias.   Skin: Negative for rash.   Neurological: Negative for dizziness and headaches.   Endo/Heme/Allergies: Does not bruise/bleed easily.   Psychiatric/Behavioral: Negative for depression.         Physical Exam   HENT:   Mouth/Throat: No oropharyngeal exudate.   Oropharynx clear, TM non-erythematous, no sinus tenderness, external ear canal normal     Eyes: No scleral icterus.   Cardiovascular: Normal rate and regular rhythm.  Exam reveals no gallop and no friction rub.    No murmur heard.  Pulmonary/Chest: No respiratory distress. She has no wheezes. She has no rales.   Dec b/s throughout with rhonchi.     Abdominal: She exhibits no distension. There is no tenderness. There is no rebound.   Musculoskeletal: She exhibits no edema.   Lymphadenopathy:     She has no cervical adenopathy.   Skin: No rash noted.   Psychiatric: Affect normal.       ICD-10-CM ICD-9-CM    1. COPD with exacerbation (HCC) J44.1 491.21 predniSONE (DELTASONE) 10 mg tablet    2. Chronic respiratory failure with hypoxia (HCC) J96.11 518.83      799.02    3. Advanced COPD (HCC) J44.9 496    4. Other iron deficiency anemia D50.8 280.8 METABOLIC PANEL, COMPREHENSIVE      IRON      FERRITIN      TRANSFERRIN      CBC WITH AUTOMATED DIFF     Mild exacerbation of COPD-- but severe COPD at baseline.  Prednisone if needed.  No antibiotics.  Needs to stop smoking.      Follow anemia-  Check iron levels- did not fall below baseline in hospital.    Diarrhea resolved.

## 2016-06-27 ENCOUNTER — Encounter: Admit: 2016-06-27 | Discharge: 2016-06-27 | Payer: MEDICARE | Primary: Internal Medicine

## 2016-06-27 ENCOUNTER — Encounter: Attending: Internal Medicine | Primary: Internal Medicine

## 2016-06-29 ENCOUNTER — Encounter: Primary: Internal Medicine

## 2016-07-03 ENCOUNTER — Encounter: Admit: 2016-07-03 | Discharge: 2016-07-03 | Payer: MEDICARE | Primary: Internal Medicine

## 2016-07-03 ENCOUNTER — Encounter: Primary: Internal Medicine

## 2016-07-04 ENCOUNTER — Encounter: Primary: Internal Medicine

## 2016-07-05 ENCOUNTER — Encounter: Admit: 2016-07-05 | Discharge: 2016-07-05 | Payer: MEDICARE | Primary: Internal Medicine

## 2016-07-05 ENCOUNTER — Telehealth

## 2016-07-05 ENCOUNTER — Encounter

## 2016-07-05 MED ORDER — ALPRAZOLAM 0.25 MG TAB
0.25 mg | ORAL_TABLET | Freq: Two times a day (BID) | ORAL | 0 refills | Status: DC
Start: 2016-07-05 — End: 2016-09-26

## 2016-07-05 MED ORDER — AMLODIPINE 5 MG TAB
5 mg | ORAL_TABLET | Freq: Every day | ORAL | 3 refills | Status: AC
Start: 2016-07-05 — End: ?

## 2016-07-05 NOTE — Telephone Encounter (Signed)
Requested Prescriptions     Pending Prescriptions Disp Refills   ??? ALPRAZolam (XANAX) 0.25 mg tablet 40 Tab 0     Sig: Take 1 Tab by mouth two (2) times a day. Max Daily Amount: 0.5 mg.   ??? amLODIPine (NORVASC) 5 mg tablet 90 Tab 3     Sig: Take 1 Tab by mouth daily.

## 2016-07-05 NOTE — Telephone Encounter (Signed)
Patients daughter called asking for a referral to see spine and pain care for pain management, she has been going to [place] and would like to use a local doctor

## 2016-07-05 NOTE — Telephone Encounter (Signed)
Done.

## 2016-07-06 ENCOUNTER — Encounter: Primary: Internal Medicine

## 2016-07-06 ENCOUNTER — Encounter: Admit: 2016-07-06 | Discharge: 2016-07-06 | Payer: MEDICARE | Primary: Internal Medicine

## 2016-07-09 ENCOUNTER — Encounter: Admit: 2016-07-09 | Discharge: 2016-07-09 | Payer: MEDICARE | Primary: Internal Medicine

## 2016-07-10 ENCOUNTER — Encounter: Admit: 2016-07-10 | Discharge: 2016-07-10 | Payer: MEDICARE | Primary: Internal Medicine

## 2016-07-10 ENCOUNTER — Encounter: Primary: Internal Medicine

## 2016-07-11 ENCOUNTER — Encounter: Admit: 2016-07-11 | Discharge: 2016-07-11 | Payer: MEDICARE | Primary: Internal Medicine

## 2016-07-11 ENCOUNTER — Encounter: Primary: Internal Medicine

## 2016-07-12 ENCOUNTER — Encounter: Admit: 2016-07-12 | Discharge: 2016-07-12 | Payer: MEDICARE | Primary: Internal Medicine

## 2016-07-12 ENCOUNTER — Encounter: Primary: Internal Medicine

## 2016-07-13 ENCOUNTER — Encounter: Admit: 2016-07-13 | Discharge: 2016-07-13 | Payer: MEDICARE | Primary: Internal Medicine

## 2016-07-16 ENCOUNTER — Encounter: Primary: Internal Medicine

## 2016-07-17 ENCOUNTER — Encounter: Primary: Internal Medicine

## 2016-07-17 NOTE — Telephone Encounter (Signed)
[place] [facility] called patient has oxygen thorough [place], will need to have records transferred to [facility]-patients daughter will call me back with company name

## 2016-07-18 ENCOUNTER — Encounter: Primary: Internal Medicine

## 2016-07-18 ENCOUNTER — Encounter: Admit: 2016-07-18 | Discharge: 2016-07-18 | Payer: MEDICARE | Primary: Internal Medicine

## 2016-07-19 ENCOUNTER — Encounter: Primary: Internal Medicine

## 2016-07-20 ENCOUNTER — Encounter: Admit: 2016-07-20 | Discharge: 2016-07-20 | Payer: MEDICARE | Primary: Internal Medicine

## 2016-07-21 ENCOUNTER — Encounter: Primary: Internal Medicine

## 2016-07-23 ENCOUNTER — Encounter: Primary: Internal Medicine

## 2016-07-23 ENCOUNTER — Encounter: Admit: 2016-07-23 | Discharge: 2016-07-23 | Payer: MEDICARE | Primary: Internal Medicine

## 2016-07-24 ENCOUNTER — Encounter: Primary: Internal Medicine

## 2016-07-24 ENCOUNTER — Encounter: Admit: 2016-07-24 | Discharge: 2016-07-24 | Payer: MEDICARE | Primary: Internal Medicine

## 2016-07-25 ENCOUNTER — Encounter: Primary: Internal Medicine

## 2016-07-27 ENCOUNTER — Encounter: Admit: 2016-07-27 | Discharge: 2016-07-27 | Payer: MEDICARE | Primary: Internal Medicine

## 2016-07-27 ENCOUNTER — Encounter: Primary: Internal Medicine

## 2016-07-30 ENCOUNTER — Encounter: Primary: Internal Medicine

## 2016-07-31 ENCOUNTER — Encounter: Primary: Internal Medicine

## 2016-07-31 NOTE — Telephone Encounter (Signed)
D/c from [facility] on today 07/31/16 with follow up 08/07/16

## 2016-08-01 ENCOUNTER — Encounter: Primary: Internal Medicine

## 2016-08-01 ENCOUNTER — Encounter: Admit: 2016-08-01 | Discharge: 2016-08-01 | Payer: MEDICARE | Primary: Internal Medicine

## 2016-08-02 ENCOUNTER — Encounter: Admit: 2016-08-02 | Discharge: 2016-08-02 | Payer: MEDICARE | Primary: Internal Medicine

## 2016-08-03 ENCOUNTER — Encounter: Admit: 2016-08-03 | Discharge: 2016-08-03 | Payer: MEDICARE | Primary: Internal Medicine

## 2016-08-06 ENCOUNTER — Encounter: Primary: Internal Medicine

## 2016-08-07 ENCOUNTER — Encounter: Primary: Internal Medicine

## 2016-08-07 ENCOUNTER — Ambulatory Visit: Admit: 2016-08-07 | Discharge: 2016-08-07 | Payer: MEDICARE | Attending: Internal Medicine | Primary: Internal Medicine

## 2016-08-07 ENCOUNTER — Ambulatory Visit: Payer: MEDICARE | Primary: Internal Medicine

## 2016-08-07 ENCOUNTER — Telehealth: Payer: Self-pay | Admitting: Family Medicine

## 2016-08-07 DIAGNOSIS — J449 Chronic obstructive pulmonary disease, unspecified: Secondary | ICD-10-CM

## 2016-08-07 NOTE — Telephone Encounter (Signed)
Left pt message asking to call Allison back directly at [phone] to schedule AWV.+ labs with Lesia and CPE with PCP. °

## 2016-08-07 NOTE — Patient Instructions (Signed)
Take meds as instructed.

## 2016-08-07 NOTE — Progress Notes (Signed)
HISTORY OF PRESENT ILLNESS  Shelby Mora  is a 83 y.o. female    Rib pain-  Treated for rib fracture UTI.  [facility] [place], witnessed, without LOC. She complained of low back and right side pain after fall. In the ER, she was found to have acute fracture of right 10th rib and L1 transverse process as well as acute cystitis. She was placed on observation for treatment. Treated with omnicef for UTI.      Anemia-  Last Hb 8.5.  No brbrr, melena.    Last a1c 6.0.      Left leg swelling- has been persistent but now worsening.          Rib Pain    The history is provided by the patient. This is a chronic problem. Associated symptoms include back pain and shortness of breath. Pertinent negatives include no abdominal pain, no cough, no dizziness, no fever, no headaches and no nausea.         Review of Systems   Constitutional: Negative for fever and weight loss.   HENT: Negative for hearing loss.    Eyes: Negative for blurred vision.   Respiratory: Positive for shortness of breath. Negative for cough.    Cardiovascular: Negative for chest pain and leg swelling.   Gastrointestinal: Negative for abdominal pain and nausea.   Genitourinary: Negative for dysuria.   Musculoskeletal: Positive for back pain and joint pain. Negative for myalgias.   Skin: Negative for rash.   Neurological: Negative for dizziness and headaches.   Endo/Heme/Allergies: Does not bruise/bleed easily.   Psychiatric/Behavioral: Negative for depression.         Physical Exam   HENT:   Mouth/Throat: No oropharyngeal exudate.   Eyes: No scleral icterus.   Neck: No thyromegaly present.   Cardiovascular: Normal rate and regular rhythm.  Exam reveals no gallop and no friction rub.    No murmur heard.  Pulmonary/Chest: No respiratory distress. She has no wheezes. She has no rales. She exhibits tenderness.   Right side chest tenderness, dec b/s diffusely   Abdominal: She exhibits no distension. There is no tenderness. There is no  rebound.   Musculoskeletal: She exhibits tenderness. She exhibits no edema.   B/l edema, but much greater on left side, no pain with dorsiflexion   Lymphadenopathy:     She has no cervical adenopathy.   Skin: No rash noted.   Psychiatric: Affect normal.       ICD-10-CM ICD-9-CM    1. Advanced COPD (HCC) J44.9 496    2. Chronic respiratory failure with hypoxia (HCC) J96.11 518.83      799.02    3. Other iron deficiency anemia D50.8 280.8 CBC WITH AUTOMATED DIFF      IRON      IRON PROFILE      FERRITIN      PATHOLOGIST REVIEW SMEARS   4. Essential hypertension I10 401.9    5. Impaired fasting blood sugar R73.01 790.21    6. Pure hypercholesterolemia E78.00 272.0    7. Closed fracture of one rib of right side, sequela S22.31XS 905.1    8. Left leg swelling M79.89 729.81 DUPLEX LOWER EXT VENOUS LEFT     COPD advanced but stable.    Continue o2   Anemia-- check iron studies, repeat CBC. Last Hb in hospital 8.5.    bp on low side.  Also too many pain/sedating meds. Stop muscle relaxer.  Pain control managed by pain  mgmt    a1c 6.0-- ok   Continue statin.    Continue pain meds for rib fracture  R/o DVT  Pt needs SW- daughter is talking one through home health but if she feels like inadequete, she is to call and we can discuss with Calton Dach.  Pt needs placement.    Greater than 50% of this 45 minutes visit was spent counseling the patient.

## 2016-08-08 ENCOUNTER — Encounter: Admit: 2016-08-08 | Discharge: 2016-08-08 | Payer: MEDICARE | Primary: Internal Medicine

## 2016-08-08 LAB — CBC WITH AUTOMATED DIFF
ABS. BASOPHILS: 0 10*3/uL (ref 0.0–0.2)
ABS. EOSINOPHILS: 0.3 10*3/uL (ref 0.0–0.4)
ABS. IMM. GRANS.: 0 10*3/uL (ref 0.0–0.1)
ABS. MONOCYTES: 0.8 10*3/uL (ref 0.1–0.9)
ABS. NEUTROPHILS: 5.8 10*3/uL (ref 1.4–7.0)
Abs Lymphocytes: 2.2 10*3/uL (ref 0.7–3.1)
BASOPHILS: 0 %
EOSINOPHILS: 3 %
HCT: 26.2 % — ABNORMAL LOW (ref 34.0–46.6)
HGB: 8 g/dL — ABNORMAL LOW (ref 11.1–15.9)
IMMATURE GRANULOCYTES: 0 %
Lymphocytes: 24 %
MCH: 26.9 pg (ref 26.6–33.0)
MCHC: 30.5 g/dL — ABNORMAL LOW (ref 31.5–35.7)
MCV: 88 fL (ref 79–97)
MONOCYTES: 9 %
NEUTROPHILS: 64 %
PLATELET: 321 10*3/uL (ref 150–379)
RBC: 2.97 x10E6/uL — ABNORMAL LOW (ref 3.77–5.28)
RDW: 14.9 % (ref 12.3–15.4)
WBC: 9.1 10*3/uL (ref 3.4–10.8)

## 2016-08-08 LAB — IRON PROFILE
Iron % saturation: 7 % — CL (ref 15–55)
Iron: 19 ug/dL — ABNORMAL LOW (ref 27–139)
TIBC: 254 ug/dL (ref 250–450)
UIBC: 235 ug/dL (ref 118–369)

## 2016-08-08 LAB — FERRITIN: Ferritin: 43 ng/mL (ref 15–150)

## 2016-08-08 NOTE — Progress Notes (Signed)
LATE ENTRY - 08/07/16 Referral received from PCP office to contact patient's family to assist with obtaining hospital bed (paperwork already faxed by PCP office to [facility]). Patient has [employer] so I have already sent a message [facility] to check the status. I did call the patient's daughter, Gloris Manchesterraci, left a message at [phone] with my contact number. Once I speak with her I will assist with needs.     08/08/16 - tried again today, no answer at phone contact.

## 2016-08-09 ENCOUNTER — Encounter: Primary: Internal Medicine

## 2016-08-09 ENCOUNTER — Inpatient Hospital Stay: Admit: 2016-08-09 | Payer: MEDICARE | Attending: Internal Medicine | Primary: Internal Medicine

## 2016-08-09 ENCOUNTER — Encounter: Admit: 2016-08-09 | Discharge: 2016-08-09 | Payer: MEDICARE | Primary: Internal Medicine

## 2016-08-09 ENCOUNTER — Telehealth

## 2016-08-09 DIAGNOSIS — M7989 Other specified soft tissue disorders: Secondary | ICD-10-CM

## 2016-08-09 MED ORDER — FERROUS SULFATE 325 MG (65 MG ELEMENTAL IRON) TAB, DELAYED RELEASE
325 mg (65 mg iron) | ORAL_TABLET | Freq: Two times a day (BID) | ORAL | 6 refills | Status: DC
Start: 2016-08-09 — End: 2016-12-07

## 2016-08-09 NOTE — Telephone Encounter (Addendum)
Iron deficiency- daughter reports no brbr, melena.  Replace iron orally. rx sent    No DVT.

## 2016-08-10 ENCOUNTER — Encounter: Primary: Internal Medicine

## 2016-08-10 ENCOUNTER — Encounter: Admit: 2016-08-10 | Discharge: 2016-08-10 | Payer: MEDICARE | Primary: Internal Medicine

## 2016-08-13 LAB — PATHOLOGIST REVIEW SMEARS
ABS. BASOPHILS: 0 10*3/uL (ref 0.0–0.2)
ABS. EOSINOPHILS: 0.3 10*3/uL (ref 0.0–0.4)
ABS. IMM. GRANS.: 0 10*3/uL (ref 0.0–0.1)
ABS. MONOCYTES: 0.8 10*3/uL (ref 0.1–0.9)
ABS. NEUTROPHILS: 6.1 10*3/uL (ref 1.4–7.0)
Abs Lymphocytes: 2.3 10*3/uL (ref 0.7–3.1)
BASOPHILS: 0 %
EOSINOPHILS: 3 %
HCT: 27.3 % — ABNORMAL LOW (ref 34.0–46.6)
HGB: 8.1 g/dL — ABNORMAL LOW (ref 11.1–15.9)
IMMATURE GRANULOCYTES: 0 %
Lymphocytes: 24 %
MCH: 26.9 pg (ref 26.6–33.0)
MCHC: 29.7 g/dL — ABNORMAL LOW (ref 31.5–35.7)
MCV: 91 fL (ref 79–97)
MONOCYTES: 8 %
NEUTROPHILS: 65 %
PLATELET: 299 10*3/uL (ref 150–379)
Platelets: NORMAL
RBC: 3.01 x10E6/uL — ABNORMAL LOW (ref 3.77–5.28)
RDW: 15 % (ref 12.3–15.4)
WBC: 9.5 10*3/uL (ref 3.4–10.8)
WBC: NORMAL

## 2016-08-13 NOTE — Progress Notes (Signed)
We are attempting course of iron.  No report of blood in stool.  May need IV iron if side effects

## 2016-08-14 ENCOUNTER — Encounter: Admit: 2016-08-14 | Discharge: 2016-08-14 | Payer: MEDICARE | Primary: Internal Medicine

## 2016-08-14 ENCOUNTER — Encounter: Primary: Internal Medicine

## 2016-08-15 ENCOUNTER — Encounter: Primary: Internal Medicine

## 2016-08-15 ENCOUNTER — Encounter: Admit: 2016-08-15 | Discharge: 2016-08-15 | Payer: MEDICARE | Primary: Internal Medicine

## 2016-08-16 ENCOUNTER — Encounter: Primary: Internal Medicine

## 2016-08-16 ENCOUNTER — Encounter: Admit: 2016-08-16 | Discharge: 2016-08-16 | Payer: MEDICARE | Primary: Internal Medicine

## 2016-08-17 ENCOUNTER — Encounter: Admit: 2016-08-17 | Discharge: 2016-08-17 | Payer: MEDICARE | Primary: Internal Medicine

## 2016-08-17 ENCOUNTER — Encounter: Primary: Internal Medicine

## 2016-08-20 ENCOUNTER — Encounter: Primary: Internal Medicine

## 2016-08-20 ENCOUNTER — Encounter: Admit: 2016-08-20 | Discharge: 2016-08-20 | Payer: MEDICARE | Primary: Internal Medicine

## 2016-08-21 ENCOUNTER — Ambulatory Visit: Admit: 2016-08-21 | Discharge: 2016-08-21 | Payer: MEDICARE | Attending: Internal Medicine | Primary: Internal Medicine

## 2016-08-21 ENCOUNTER — Encounter: Primary: Internal Medicine

## 2016-08-21 DIAGNOSIS — N390 Urinary tract infection, site not specified: Secondary | ICD-10-CM

## 2016-08-21 LAB — AMB POC URINALYSIS DIP STICK AUTO W/O MICRO
Nitrites (UA POC): POSITIVE
Protein (UA POC): 300
Specific gravity (UA POC): 1.03 (ref 1.001–1.035)
Urobilinogen (UA POC): 1 (ref 0.2–1)
pH (UA POC): 6 (ref 4.6–8.0)

## 2016-08-21 MED ORDER — CEFPODOXIME 100 MG TAB
100 mg | ORAL_TABLET | Freq: Two times a day (BID) | ORAL | 0 refills | Status: AC
Start: 2016-08-21 — End: 2016-08-31

## 2016-08-21 NOTE — Progress Notes (Signed)
HISTORY OF PRESENT ILLNESS  Shelby EkeBarbara Mora is a 83 y.o. female is seen today for   Chief Complaint   Patient presents with   ??? Foot Swelling     bialteral foot swelling   ??? Urinary Burning     x 1 week     Urinary Tract Infection  Patient complains of dysuria, frequency.  Onset was 7 days ago, unchanged since that time. Patient complains of burning. Patient denies back pain and fever.   Patient does have a history of recurrent UTI.  Patient does not have a history of pyelonephritis.    Also diarrhea-- intermittent.    Anemia- taking iron now.     Foot swelling--  Persistent.        Foot Swelling    The history is provided by the patient. This is a chronic problem.   Urinary Burning   Pertinent negatives include no chest pain, no abdominal pain, no headaches and no shortness of breath.       ROS  Review of Systems   Constitutional: Negative for fever and weight loss.   HENT: Negative for hearing loss.    Eyes: Negative for blurred vision.   Respiratory: Negative for shortness of breath.    Cardiovascular: Positive for leg swelling. Negative for chest pain.   Gastrointestinal: Negative for abdominal pain and nausea.   Genitourinary: Negative for dysuria.   Musculoskeletal: Negative for myalgias.   Skin: Negative for rash.   Neurological: Negative for dizziness and headaches.   Endo/Heme/Allergies: Does not bruise/bleed easily.   Psychiatric/Behavioral: Negative for depression.       PHYSICAL EXAM  Physical Exam   HENT:   Mouth/Throat: No oropharyngeal exudate.   Eyes: No scleral icterus.   Neck: No thyromegaly present.   Cardiovascular: Normal rate and regular rhythm.  Exam reveals no gallop and no friction rub.    No murmur heard.  Pulmonary/Chest: No respiratory distress. She has wheezes. She has no rales.   Abdominal: She exhibits no distension. There is no tenderness. There is no rebound.   Musculoskeletal: She exhibits edema.   1+edema   Lymphadenopathy:     She has no cervical adenopathy.   Skin: No rash noted.    Psychiatric: Affect normal.       ICD-10-CM ICD-9-CM    1. Urinary tract infection without hematuria, site unspecified N39.0 599.0 cefpodoxime (VANTIN) 100 mg tablet   2. Burning with urination R30.0 788.1 AMB POC URINALYSIS DIP STICK AUTO W/O MICRO      CULTURE, URINE   3. Other iron deficiency anemia D50.8 280.8    4. Advanced COPD (HCC) J44.9 496    5. Chronic respiratory failure with hypoxia (HCC) J96.11 518.83      799.02    6. Loose stools R19.5 787.7    7. Edema, unspecified type R60.9 782.3      Treat uti  Continue iron    Continue o2  Metamucil for loose stools  Edema- likely venous insufficiency-- Rx compression stockings given.     Greater than 50% of this 25 minutes visit was spent counseling the patient.

## 2016-08-21 NOTE — Patient Instructions (Addendum)
METAMUCIL DAILY    JUST PROBIOTIC WHILE TAKING ANTIBIOTIC.

## 2016-08-22 ENCOUNTER — Encounter: Admit: 2016-08-22 | Discharge: 2016-08-22 | Payer: MEDICARE | Primary: Internal Medicine

## 2016-08-23 ENCOUNTER — Encounter: Primary: Internal Medicine

## 2016-08-24 LAB — CULTURE, URINE

## 2016-08-25 ENCOUNTER — Emergency Department
Admission: EM | Admit: 2016-08-25 | Discharge: 2016-08-26 | Disposition: A | Discharge status: Home / Self Care | Payer: Medicare Other | Attending: Emergency Medicine | Admitting: Emergency Medicine

## 2016-08-25 ENCOUNTER — Encounter: Payer: Self-pay | Admitting: Emergency Medicine

## 2016-08-25 DIAGNOSIS — Z951 Presence of aortocoronary bypass graft: Secondary | ICD-10-CM | POA: Insufficient documentation

## 2016-08-25 DIAGNOSIS — Z7982 Long term (current) use of aspirin: Secondary | ICD-10-CM | POA: Insufficient documentation

## 2016-08-25 DIAGNOSIS — I2510 Atherosclerotic heart disease of native coronary artery without angina pectoris: Secondary | ICD-10-CM | POA: Insufficient documentation

## 2016-08-25 DIAGNOSIS — R070 Pain in throat: Secondary | ICD-10-CM | POA: Diagnosis present

## 2016-08-25 DIAGNOSIS — Z96611 Presence of right artificial shoulder joint: Secondary | ICD-10-CM | POA: Diagnosis not present

## 2016-08-25 DIAGNOSIS — T17308A Unspecified foreign body in larynx causing other injury, initial encounter: Secondary | ICD-10-CM

## 2016-08-25 DIAGNOSIS — Z87891 Personal history of nicotine dependence: Secondary | ICD-10-CM | POA: Insufficient documentation

## 2016-08-25 DIAGNOSIS — I509 Heart failure, unspecified: Secondary | ICD-10-CM | POA: Insufficient documentation

## 2016-08-25 DIAGNOSIS — E1149 Type 2 diabetes mellitus with other diabetic neurological complication: Secondary | ICD-10-CM | POA: Diagnosis not present

## 2016-08-25 DIAGNOSIS — Z794 Long term (current) use of insulin: Secondary | ICD-10-CM | POA: Diagnosis not present

## 2016-08-25 DIAGNOSIS — J449 Chronic obstructive pulmonary disease, unspecified: Secondary | ICD-10-CM | POA: Insufficient documentation

## 2016-08-25 DIAGNOSIS — R0989 Other specified symptoms and signs involving the circulatory and respiratory systems: Secondary | ICD-10-CM | POA: Diagnosis not present

## 2016-08-25 NOTE — ED Triage Notes (Signed)
Patient presents to Emergency Department via [facility] with complaints of eating chicken fajitas and felt something stuck in throat.  Pt reports that the obstruction has since resolved but now feels some discomfort in the throat area.  Pt on 2.5 lpm [place] at home for COPD, HTN hyperlipid and bypass surgery.

## 2016-08-26 ENCOUNTER — Emergency Department: Payer: Medicare Other

## 2016-08-26 DIAGNOSIS — R0989 Other specified symptoms and signs involving the circulatory and respiratory systems: Secondary | ICD-10-CM | POA: Diagnosis not present

## 2016-08-26 NOTE — ED Provider Notes (Signed)
[facility] Emergency Department Provider Note  ____________________________________________  Time seen: Approximately 1:17 AM  I have reviewed the triage vital signs and the nursing notes.   HISTORY  Chief Complaint Aspiration   HPI [name] is a 83 y.o. female with history as listed below presents for evaluation after choking event. Patient reports that she is here visiting her son from [place]. She was in his house eating chicken quesadilla when she choked on a piece of chicken. She reports that her episode was severe and lasted a several seconds. EMS was called patient was brought to the emergency room for evaluation. Patient denies vomiting, she is handling her saliva, she does not have food impacted at this time. No prior history of esophageal impaction. She has no pain, no shortness of breath, she tells me she she is here because EMS told her she needed to be checked.Patient asymptomatic at this time.   Past Medical History:  Diagnosis Date  . Adrenal mass (HCC)    a. Excision 2007.  [name] Anemia, deficiency 07/01/2004   (w/u by Dr. Lorre Nick)  . Anxiety and depression   . Arteriovenous malformation of gastrointestinal tract   . C. difficile colitis    a. 04/2008 = severe.  [name] CAD (coronary artery disease) 09/01/2002   a. s/p NSTEMI 2012. b. s/p CABG 01/2012.  [name] CHF (congestive heart failure) (HCC)   . Chronic back pain   . Chronic back pain greater than 3 months duration 11/30/2014  . COPD (chronic obstructive pulmonary disease) (HCC) 05/31/2004  . Diabetes mellitus type II 07/31/1997  . diverticulosis of colon   . Fatty liver   . GI bleed    a. 04/2011 felt diverticular requiring microembolization by vascular.  [name] Hx of CABG 04/18/2012  . Hyperlipemia 07/02/1999  . Hypertension 08/01/1995  . Osteoarthritis 04/03/1983  . Osteoporosis 12/31/2000  . PAF (paroxysmal atrial fibrillation) (HCC) 12/16/2013  . Restless leg syndrome   .  Rheumatoid arthritis(714.0) 04/03/1983  . Splenic artery aneurysm (HCC)   . Status post replacement of both shoulder joints 02/09/2014    Patient Active Problem List   Diagnosis Date Noted  . Muscle weakness (generalized)   . Unsteadiness on feet   . Weakness   . Hypercapnemia   . Acute on chronic respiratory failure (HCC) 02/09/2016  . Tobacco abuse counseling 09/27/2015  . 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 Chronic respiratory failure with hypoxia (HCC) 04/14/2015  . Failed back syndrome of lumbar spine 11/30/2014  . 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 PAF (paroxysmal atrial fibrillation) (HCC) 12/16/2013  . COPD exacerbation (HCC) 01/13/2013  . History of GI diverticular bleed 09/25/2012  . Depression 06/17/2012  . Hx of CABG 04/18/2012  . Chronic diastolic CHF (congestive heart failure) (HCC) 02/05/2012  . Splenic artery aneurysm (HCC)   . History of non-ST elevation myocardial infarction (NSTEMI) 07/22/2011  . Smoking 02/09/2011  . AAA (abdominal aortic aneurysm) (HCC) 12/22/2010  . DIVERTICULOSIS OF COLON 05/14/2008  . ANXIETY DEPRESSION 04/19/2008  . ECZEMA 07/21/2007  . ARTERIOVENOUS MALFORMATION, GASTRIC 11/08/2006  . INSOMNIA, CHRONIC 07/21/2006  . ANEMIA-IRON DEFICIENCY 07/01/2004  . Chronic obstructive pulmonary disease (HCC) 05/31/2004  . CAD (coronary artery disease) 09/01/2002  . FATTY LIVER DISEASE 07/16/2002  . OSTEOPOROSIS 12/31/2000  . HYPERLIPIDEMIA 07/02/1999  . 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 Essential hypertension, benign 08/01/1995  . Rheumatoid arthritis(714.0) 04/03/1983  . OSTEOARTHRITIS 04/03/1983  Past Surgical History:  Procedure Laterality Date  . ANKLE SURGERY     neuroma repair, right ORIF  . CARPAL TUNNEL RELEASE     bilat  . COLONIC EMBOLIZATION  04/18/2011   Dr. Wyn Quaker, acute GI bleed  . CORONARY ARTERY BYPASS GRAFT  01/22/2012   Procedure:  CORONARY ARTERY BYPASS GRAFTING (CABG);  Surgeon: Delight Ovens, MD;  Location: [facility] OR;  Service: Open Heart Surgery;  Laterality: N/A;  Coronary Artery Bypass Graft times four utilizing the left internal mammary artery and the left greater saphenous vein harvested endoscopically.  [name] EPIDURAL BLOCK INJECTION  05/2005   x 3  . INGUINAL HERNIA REPAIR    . LAMINECTOMY  06/2006   L4/5 spinal stenosis (Dr. [name])  . LAMINECTOMY  02/24/2008   L3/4 Ant/Lat interbody fusion and Lat Artthrodesis w/plate using XLIF (Dr. [name])  . long finger tendon realignment  10/31/2005   [place]  . lumbar epidural  06/13/2006  . NASAL SINUS SURGERY    . PARTIAL HYSTERECTOMY     ovaries intact, dysmennorhea w/ A/P repari  . ROTATOR CUFF REPAIR  11/22/2003   ([place]) left  . SPINAL CORD STIMULATOR IMPLANT    . TEE WITHOUT CARDIOVERSION  01/22/2012   Procedure: TRANSESOPHAGEAL ECHOCARDIOGRAM (TEE);  Surgeon: Delight Ovens, MD;  Location: [facility] OR;  Service: Open Heart Surgery;  Laterality: N/A;  . TOTAL SHOULDER REPLACEMENT  12/13/1998   right, ([facility])  . TRIGGER FINGER RELEASE     right  . WRIST SURGERY     neuroma repair, right    Prior to Admission medications   Medication Sig Start Date End Date Taking? Authorizing Provider  acidophilus (RISAQUAD) CAPS capsule Take 1 capsule by mouth daily. 02/13/16   Alford Highland, MD  albuterol (PROVENTIL) (2.5 MG/3ML) 0.083% nebulizer solution Take 3 mLs (2.5 mg total) by nebulization every 4 (four) hours. While awake 02/13/16   Alford Highland, MD  ALPRAZolam Prudy Feeler) 0.25 MG tablet Take 1 tablet (0.25 mg total) by mouth 2 (two) times daily. 02/13/16   Alford Highland, MD  amLODipine (NORVASC) 5 MG tablet TAKE 1 TABLET(5 MG) BY MOUTH DAILY 11/21/15   Robbie Lis M, PA-C  aspirin EC 81 MG tablet Take 1 tablet (81 mg total) by mouth daily. 08/13/13   [name], MD  atorvastatin (LIPITOR) 40 MG tablet TAKE 1 TABLET(40 MG) BY MOUTH DAILY 11/07/15    Robbie Lis M, PA-C  docusate sodium (COLACE) 100 MG capsule Take 1 capsule (100 mg total) by mouth 2 (two) times daily as needed for mild constipation. 02/13/16   Alford Highland, MD  fentaNYL (DURAGESIC - DOSED MCG/HR) 25 MCG/HR patch Place 1 patch (25 mcg total) onto the skin every 3 (three) days. 02/13/16   Alford Highland, MD  Fluticasone-Salmeterol (ADVAIR DISKUS) 500-50 MCG/DOSE AEPB Inhale 1 puff into the lungs 2 (two) times daily. 09/20/15   Copland, Karleen Hampshire, MD  furosemide (LASIX) 20 MG tablet Take 1 tablet (20 mg total) by mouth daily as needed. For shortness of breath or 3 lb weight gain 02/13/16 02/12/17  Alford Highland, MD  insulin aspart (NOVOLOG) 100 UNIT/ML injection Check fingerstick qac and qhs.  For Glucose of 151 to 200 give 3 units subcutaneous injection; 201-250 give 5 units; 251-300 give 7 units; 301-350 give 9 units; 351 to 400 give 11 units; greater than 401 give 15 units 02/13/16   Wieting, Richard, MD  levofloxacin (LEVAQUIN) 250 MG tablet Take 1 tablet (250 mg total) by mouth daily. 02/13/16  Wieting, Richard, MD  lidocaine (LIDODERM) 5 % Place 1 patch onto the skin daily. Remove & Discard patch within 12 hours or as directed by MD 02/13/16   Alford Highland, MD  Melatonin 5 MG TABS Take 5 mg by mouth at bedtime as needed (for sleep).     [provider]  metoprolol (LOPRESSOR) 50 MG tablet Take 1 tablet (50 mg total) by mouth 2 (two) times daily. 02/13/16   Alford Highland, MD  nitroGLYCERIN (NITROSTAT) 0.4 MG SL tablet Place 0.4 mg under the tongue every 5 (five) minutes as needed for chest pain.    [provider]  Omega-3 Fatty Acids (FISH OIL) 1000 MG CAPS Take 1,000 mg by mouth daily.     [provider]  oxyCODONE (OXY IR/ROXICODONE) 5 MG immediate release tablet Take 1 tablet (5 mg total) by mouth every 4 (four) hours as needed for moderate pain or severe pain (breakthrough pain). 02/13/16   Alford Highland, MD  predniSONE  (DELTASONE) 5 MG tablet 4 tabs po day1; 3 tabs po day2; 2 tabs po day3,4; 1 tab po day5,6 02/13/16   Alford Highland, MD  ramipril (ALTACE) 5 MG capsule Take 2 capsules (10 mg total) by mouth at bedtime. 04/19/15   Robbie Lis M, PA-C  tiotropium (SPIRIVA) 18 MCG inhalation capsule Place 1 capsule (18 mcg total) into inhaler and inhale daily. 09/20/15   Copland, Karleen Hampshire, MD  tiZANidine (ZANAFLEX) 4 MG tablet Take 2-4 mg by mouth at bedtime as needed for muscle spasms.    [provider]    Allergies Other; Penicillins; Sulfa antibiotics; Morphine sulfate; Macrolides and ketolides; Iron; and Naproxen  Family History  Problem Relation Age of Onset  . Hypotension Mother   . Dementia Mother   . Alcohol abuse Father   . Heart disease Father        MI, enlarged heart  . Diabetes Brother     Social History Social History  Substance Use Topics  . Smoking status: Former Smoker    Packs/day: 1.00    Years: 50.00    Types: Cigarettes    Quit date: 12/18/2014  . Smokeless tobacco: Former Neurosurgeon  . Alcohol use 0.0 oz/week     Comment: red wine occassionally    Review of Systems  Constitutional: Negative for fever. + choking Eyes: Negative for visual changes. ENT: Negative for sore throat. Neck: No neck pain  Cardiovascular: Negative for chest pain. Respiratory: Negative for shortness of breath. Gastrointestinal: Negative for abdominal pain, vomiting or diarrhea. Genitourinary: Negative for dysuria. Musculoskeletal: Negative for back pain. Skin: Negative for rash. Neurological: Negative for headaches, weakness or numbness. Psych: No SI or HI  ____________________________________________   PHYSICAL EXAM:  VITAL SIGNS: ED Triage Vitals  Enc Vitals Group     BP 08/25/16 2317 (!) 143/68     Pulse Rate 08/25/16 2317 82     Resp 08/25/16 2317 18     Temp 08/25/16 2317 98.2 F (36.8 C)     Temp Source 08/25/16 2317 Oral     SpO2 08/25/16 2317 98 %     Weight  08/25/16 2318 145 lb (65.8 kg)     Height 08/25/16 2318 5' (1.524 m)     Head Circumference --      Peak Flow --      Pain Score --      Pain Loc --      Pain Edu? --      Excl. in GC? --  Constitutional: Alert and oriented. Well appearing and in no apparent distress. HEENT:      Head: Normocephalic and atraumatic.         Eyes: Conjunctivae are normal. Sclera is non-icteric.       Mouth/Throat: Mucous membranes are moist.       Neck: Supple with no signs of meningismus. Cardiovascular: Regular rate and rhythm. No murmurs, gallops, or rubs. 2+ symmetrical distal pulses are present in all extremities. No JVD. Respiratory: Normal respiratory effort. Lungs are clear to auscultation bilaterally. No wheezes, crackles, or rhonchi.  Gastrointestinal: Soft, non tender, and non distended with positive bowel sounds. No rebound or guarding. Genitourinary: No CVA tenderness. Musculoskeletal: Nontender with normal range of motion in all extremities. No edema, cyanosis, or erythema of extremities. Neurologic: Normal speech and language. Face is symmetric. Moving all extremities. No gross focal neurologic deficits are appreciated. Skin: Skin is warm, dry and intact. No rash noted. Psychiatric: Mood and affect are normal. Speech and behavior are normal.  ____________________________________________   LABS (all labs ordered are listed, but only abnormal results are displayed)  Labs Reviewed - No data to display ____________________________________________  EKG  none  ____________________________________________  RADIOLOGY  CXR: Chronic elevation of the right hemidiaphragm. Bibasilar airspace opacities likely reflect atelectasis.  ____________________________________________   PROCEDURES  Procedure(s) performed: None Procedures Critical Care performed:  None ____________________________________________   INITIAL IMPRESSION / ASSESSMENT AND PLAN / ED COURSE  83 y.o. female with  history as listed below presents for evaluation after choking event. Patient is asymptomatic at this time. Normal vital signs, she is tolerating by mouth without difficulty. CXR showing no changes from baseline. Patient will be discharged home. Recommended that she return to the emergency room or follows up with her I'm a care doctor if she develops productive cough, fever, chest pain, shortness of breath. Patient is comfortable with the plan.       Pertinent labs & imaging results that were available during my care of the patient were reviewed by me and considered in my medical decision making (see chart for details).    ____________________________________________   FINAL CLINICAL IMPRESSION(S) / ED DIAGNOSES  Final diagnoses:  Choking, initial encounter      NEW MEDICATIONS STARTED DURING THIS VISIT:  New Prescriptions   No medications on file     Note:  This document was prepared using Dragon voice recognition software and may include unintentional dictation errors.    Don Perking, [place], MD 08/26/16 [phone]

## 2016-08-26 NOTE — ED Notes (Signed)
Patient transported to X-ray 

## 2016-08-26 NOTE — Discharge Instructions (Signed)
Follow-up with your doctor or return to the emergency room if you develop shortness of breath, productive cough, or fever.

## 2016-08-26 NOTE — ED Notes (Signed)
Pt given lemon swabs and lip moisturizer

## 2016-08-28 NOTE — Progress Notes (Signed)
CCM outreach attempt to contact the patient and daughter, no response. Left another message on daughter's voicemail. [facility] for any home care and was also told patient lives in a nursing home in [place] according to what the grand daughter told [facility].     This note will not be viewable in MyChart.

## 2016-08-28 NOTE — Progress Notes (Signed)
UTI-- 2 organisms. Both susceptible to vantin.  Pt instructed to call or RTO if not improved

## 2016-08-29 ENCOUNTER — Encounter: Attending: Internal Medicine | Primary: Internal Medicine

## 2016-09-03 NOTE — Telephone Encounter (Signed)
Left pt message asking to call Allison back directly at [phone] to schedule AWV.+ labs with Lesia and CPE with PCP. °

## 2016-09-19 ENCOUNTER — Encounter: Attending: Internal Medicine | Primary: Internal Medicine

## 2016-09-26 ENCOUNTER — Ambulatory Visit: Admit: 2016-09-26 | Discharge: 2016-09-26 | Payer: MEDICARE | Attending: Internal Medicine | Primary: Internal Medicine

## 2016-09-26 DIAGNOSIS — L0390 Cellulitis, unspecified: Secondary | ICD-10-CM

## 2016-09-26 MED ORDER — ALPRAZOLAM 0.25 MG TAB
0.25 mg | ORAL_TABLET | Freq: Two times a day (BID) | ORAL | 3 refills | Status: DC | PRN
Start: 2016-09-26 — End: 2016-12-07

## 2016-09-26 MED ORDER — CEPHALEXIN 500 MG CAP
500 mg | ORAL_CAPSULE | Freq: Four times a day (QID) | ORAL | 0 refills | Status: DC
Start: 2016-09-26 — End: 2016-10-01

## 2016-09-26 NOTE — Patient Instructions (Signed)
Probiotic-- takes

## 2016-09-26 NOTE — Progress Notes (Signed)
HISTORY OF PRESENT ILLNESS  Shelby Mora is a 83 y.o. female is seen today for   Chief Complaint   Patient presents with   ??? Leg Swelling     c/o bilateral leg swelling with oozing ulcers, c/o ulcer on right elbow     Leg swelling-  Venous insufficiency- started developing ulcers/blister, right leg and left leg. No fever, chills.      Leg Swelling   The history is provided by the patient. Pertinent negatives include no chest pain, no abdominal pain, no headaches and no shortness of breath.       ROS  Review of Systems   Constitutional: Negative for fever and weight loss.   HENT: Negative for hearing loss.    Eyes: Negative for blurred vision.   Respiratory: Negative for shortness of breath.    Cardiovascular: Positive for leg swelling. Negative for chest pain, palpitations and PND.   Gastrointestinal: Negative for abdominal pain and nausea.   Genitourinary: Negative for dysuria.   Musculoskeletal: Negative for myalgias.   Skin: Negative for rash.   Neurological: Negative for dizziness and headaches.   Endo/Heme/Allergies: Does not bruise/bleed easily.   Psychiatric/Behavioral: Negative for depression.       PHYSICAL EXAM  Physical Exam   HENT:   Mouth/Throat: No oropharyngeal exudate.   Eyes: No scleral icterus.   Cardiovascular: Normal rate and regular rhythm.  Exam reveals no gallop and no friction rub.    No murmur heard.  Pulmonary/Chest: No respiratory distress. She has no wheezes. She has no rales.   Dec b/s throughout   Musculoskeletal: She exhibits edema.   Ulcers medial lower calf with pus and some minor blistering.     Lymphadenopathy:     She has no cervical adenopathy.   Skin: No rash noted.   Psychiatric: Affect normal.       ICD-10-CM ICD-9-CM    1. Cellulitis, unspecified cellulitis site L03.90 682.9 cephALEXin (KEFLEX) 500 mg capsule      REFERRAL TO HOME HEALTH   2. Venous insufficiency I87.2 459.81    3. Chronic respiratory failure with hypoxia (HCC) J96.11 518.83      799.02     4. Advanced COPD (HCC) J44.9 496    5. Essential hypertension I10 401.9    6. Anxiety F41.9 300.00 ALPRAZolam (XANAX) 0.25 mg tablet     Consistent w impetigo/cellulitis. Keflex.  Home health for wound care.  Restart lasix.  If can clear infection, to [facility].  Check renal next visit given lasix    Advanced copd- end stage. Continue treatment.    Xanax as needed    Greater than 50% of this 25 minutes visit was spent counseling the patient.

## 2016-10-01 ENCOUNTER — Telehealth

## 2016-10-01 MED ORDER — CLINDAMYCIN 300 MG CAP
300 mg | ORAL_CAPSULE | Freq: Four times a day (QID) | ORAL | 0 refills | Status: AC
Start: 2016-10-01 — End: 2016-10-11

## 2016-10-01 NOTE — Telephone Encounter (Signed)
May be MRSA.  Start clindamycin.  Take probiotic. Stop keflex in 2 days.  If not improved to ER

## 2016-10-01 NOTE — Telephone Encounter (Signed)
Daughter of patient called in stating that Shelby Mora leg is not looking any better she wanted to know what she should do about this issue

## 2016-10-04 NOTE — Telephone Encounter (Signed)
HH nurse wanted to let Dr. Kennedy Buckerousseau that they started care for this patient.     Couldn't verify what they are going to do. Wound care and daughter is requesting HH aid.     Requesting a call back

## 2016-10-11 ENCOUNTER — Encounter: Payer: MEDICARE | Attending: Internal Medicine | Primary: Internal Medicine

## 2016-10-12 ENCOUNTER — Ambulatory Visit: Admit: 2016-10-12 | Discharge: 2016-10-12 | Payer: MEDICARE | Attending: Internal Medicine | Primary: Internal Medicine

## 2016-10-12 DIAGNOSIS — L0100 Impetigo, unspecified: Secondary | ICD-10-CM

## 2016-10-12 MED ORDER — CEPHALEXIN 500 MG CAP
500 mg | ORAL_CAPSULE | Freq: Four times a day (QID) | ORAL | 0 refills | Status: AC
Start: 2016-10-12 — End: 2016-10-19

## 2016-10-12 MED ORDER — CLINDAMYCIN 300 MG CAP
300 mg | ORAL_CAPSULE | Freq: Three times a day (TID) | ORAL | 0 refills | Status: AC
Start: 2016-10-12 — End: 2016-10-19

## 2016-10-12 NOTE — Progress Notes (Signed)
HISTORY OF PRESENT ILLNESS  Shelby Mora is a 83 y.o. female is seen today for   Chief Complaint   Patient presents with   ??? Follow-up     bilateral leg swelling, wound care done at home      Leg swelling- started clindamycin. Still w swelling and weeping.  Wound care seeing.        Leg Swelling   The history is provided by the patient.       ROS  Review of Systems   Constitutional: Negative for fever and weight loss.   HENT: Negative for hearing loss.    Eyes: Negative for blurred vision.   Cardiovascular: Positive for leg swelling. Negative for palpitations, claudication and PND.   Gastrointestinal: Negative for nausea.   Genitourinary: Negative for dysuria.   Musculoskeletal: Negative for myalgias.   Skin: Negative for rash.   Neurological: Negative for dizziness.   Endo/Heme/Allergies: Does not bruise/bleed easily.   Psychiatric/Behavioral: Negative for depression.       PHYSICAL EXAM  Physical Exam   HENT:   Mouth/Throat: No oropharyngeal exudate.   Eyes: No scleral icterus.   Cardiovascular: Normal rate and regular rhythm.  Exam reveals no gallop and no friction rub.    No murmur heard.  Pulmonary/Chest: No respiratory distress. She has no wheezes. She has no rales.   Dec b/s throughout   Musculoskeletal: She exhibits edema.   Ulcers medial lower calf  On right with pus and some  blistering.  Some dusky appearance as well.    Lymphadenopathy:     She has no cervical adenopathy.   Skin: No rash noted.   Psychiatric: Affect normal.       ICD-10-CM ICD-9-CM    1. Impetigo L01.00 684 clindamycin (CLEOCIN) 300 mg capsule      cephALEXin (KEFLEX) 500 mg capsule      REFERRAL TO WOUND CARE   2. Advanced COPD (HCC) J44.9 496    3. Chronic respiratory failure with hypoxia (HCC) J96.11 518.83      799.02      Still favor impetigo.  Add keflex to cover strep.  Another 7 days of clindamycin as well.  Some dusky appearance so will get wound care opinion as well-- wraps needed??     Advanced copd-- The current medical regimen is effective;  continue present plan and medications.    Greater than 50% of this 25 minutes visit was spent counseling the patient.

## 2016-10-12 NOTE — Patient Instructions (Signed)
Take meds as instructed.

## 2016-10-18 NOTE — Telephone Encounter (Signed)
Patient's daughter wants to know status of wound care.

## 2016-10-26 NOTE — Telephone Encounter (Signed)
Family is requesting HH aid for this patient.  Requesting a call back

## 2016-10-30 NOTE — Telephone Encounter (Signed)
Spoke with Shelby MessierKathy gave verbals for home health aid 2 times per week,also let her know wound care has tried contacting patient

## 2016-11-05 ENCOUNTER — Encounter: Payer: MEDICARE | Attending: Internal Medicine | Primary: Internal Medicine

## 2016-11-05 NOTE — Telephone Encounter (Signed)
[facility], states patient was complaining of painful urination last week, but has resolved as of today, also states they found a spider in her ulna boot last week and now area has a black spot in the middle, advised Shelby MessierKathy that Shelby Mora is on the schedule for today at 3:45

## 2016-11-20 NOTE — Telephone Encounter (Signed)
HH nurse wanted to let Dr. Kennedy Bucker know that patient has been very confused and more agitated since last weekend. Daughter has difficulty bringing her mother to our office and want an order to get urine specimen to get UTI checked.   Nurse will to patient's house tomorrow and   requests verbal order.

## 2016-11-20 NOTE — Telephone Encounter (Signed)
She needs to be seen.

## 2016-11-21 NOTE — Telephone Encounter (Signed)
Informed to HH nurse.

## 2016-12-07 ENCOUNTER — Ambulatory Visit: Admit: 2016-12-07 | Discharge: 2016-12-07 | Payer: MEDICARE | Attending: Internal Medicine | Primary: Internal Medicine

## 2016-12-07 DIAGNOSIS — J9611 Chronic respiratory failure with hypoxia: Secondary | ICD-10-CM

## 2016-12-07 MED ORDER — FERROUS SULFATE 325 MG (65 MG ELEMENTAL IRON) TAB, DELAYED RELEASE
325 mg (65 mg iron) | ORAL_TABLET | Freq: Two times a day (BID) | ORAL | 6 refills | Status: AC
Start: 2016-12-07 — End: ?

## 2016-12-07 MED ORDER — ALPRAZOLAM 0.25 MG TAB
0.25 mg | ORAL_TABLET | Freq: Two times a day (BID) | ORAL | 3 refills | Status: DC | PRN
Start: 2016-12-07 — End: 2017-02-20

## 2016-12-07 MED ORDER — CEFPODOXIME 100 MG TAB
100 mg | ORAL_TABLET | Freq: Two times a day (BID) | ORAL | 0 refills | Status: AC
Start: 2016-12-07 — End: 2016-12-14

## 2016-12-07 NOTE — Addendum Note (Signed)
Addended by: Alroy BailiffOUSSEAU, Musa [facility]s [facility] on: 12/07/2016 12:52 PM      Modules accepted: Orders

## 2016-12-07 NOTE — Progress Notes (Signed)
HISTORY OF PRESENT ILLNESS  Shelby EkeBarbara Mora is a 83 y.o. female is seen today for   Chief Complaint   Patient presents with   ??? Dysuria     c/o painful urination x 2 Shelby Mora, c/o right leg drainage,c/o weakness,fatigue     Leg swelling- finished clindamycin.  Having home health.    C/o burning x 1 week.   Has TENS unit in her hip.    COPD - uses 02 at night   Feels very weak.      Dysuria     Leg Swelling   The history is provided by the patient.       ROS  Review of Systems   Constitutional: Negative for fever and weight loss.   HENT: Negative for hearing loss.    Eyes: Negative for blurred vision.   Cardiovascular: Positive for leg swelling. Negative for palpitations, claudication and PND.   Gastrointestinal: Negative for nausea.   Genitourinary: Positive for difficulty urinating. Negative for dysuria.   Musculoskeletal: Negative for myalgias.   Skin: Negative for rash.   Neurological: Negative for dizziness.   Endo/Heme/Allergies: Does not bruise/bleed easily.   Psychiatric/Behavioral: Negative for depression.       PHYSICAL EXAM  Physical Exam   HENT:   Mouth/Throat: No oropharyngeal exudate.   Eyes: No scleral icterus.   Cardiovascular: Normal rate and regular rhythm.  Exam reveals no gallop and no friction rub.    No murmur heard.  Pulmonary/Chest: No respiratory distress. She has no wheezes. She has no rales.   Dec b/s throughout   Musculoskeletal: She exhibits edema.   Ulcers medial lower calf on right calf.  Swelling-     Lymphadenopathy:     She has no cervical adenopathy.   Skin: No rash noted.   Psychiatric: Affect normal.       ICD-10-CM ICD-9-CM    1. Chronic respiratory failure with hypoxia (HCC) J96.11 518.83      799.02    2. Anxiety F41.9 300.00 ALPRAZolam (XANAX) 0.25 mg tablet   3. Other iron deficiency anemia D50.8 280.8 ferrous sulfate (IRON) 325 mg (65 mg iron) EC tablet      CBC WITH AUTOMATED DIFF      FERRITIN      IRON PROFILE   4. Advanced COPD (HCC) J44.9 496     5. Essential hypertension I10 401.9 METABOLIC PANEL, COMPREHENSIVE   6. Impaired fasting blood sugar R73.01 790.21 HEMOGLOBIN A1C WITH EAG   7. Burning with urination R30.0 788.1 CULTURE, URINE   8. Venous insufficiency I87.2 459.81 REFERRAL TO CARDIOLOGY     Continue O2  Xanax prn.    Given fatigue, check for anemia.  Iron levels.  No report of melena.  Would not be best colo candidate    COPD-  Advanced.  On spiriva.    Check urine culture.    Given advance ven insufficiency, dyspnea, r/o right heart failure with echo.  Could benefit from lasix.      Greater than 50% of this 45 minutes visit was spent counseling the patient.    Advance Care Plan was discussed but the patient did not wish or was not able to name a surrogate decision maker or provide an Advance Care Plan     Pt wanted to think over hospice possibility given chronic medical problems. Given medical state likely would qualify.

## 2016-12-07 NOTE — Patient Instructions (Signed)
Take meds as instructed.

## 2016-12-08 LAB — FERRITIN: Ferritin: 54 ng/mL (ref 15–150)

## 2016-12-08 LAB — METABOLIC PANEL, COMPREHENSIVE
A-G Ratio: 0.8 — ABNORMAL LOW (ref 1.2–2.2)
ALT (SGPT): 9 IU/L (ref 0–32)
AST (SGOT): 10 IU/L (ref 0–40)
Albumin: 3.3 g/dL — ABNORMAL LOW (ref 3.5–4.7)
Alk. phosphatase: 65 IU/L (ref 39–117)
BUN/Creatinine ratio: 33 — ABNORMAL HIGH (ref 12–28)
BUN: 20 mg/dL (ref 8–27)
Bilirubin, total: 0.2 mg/dL (ref 0.0–1.2)
CO2: 29 mmol/L (ref 20–29)
Calcium: 9.4 mg/dL (ref 8.7–10.3)
Chloride: 96 mmol/L (ref 96–106)
Creatinine: 0.61 mg/dL (ref 0.57–1.00)
GFR est AA: 97 mL/min/{1.73_m2} (ref >59)
GFR est non-AA: 84 mL/min/{1.73_m2} (ref >59)
GLOBULIN, TOTAL: 4.1 g/dL (ref 1.5–4.5)
Glucose: 141 mg/dL — ABNORMAL HIGH (ref 65–99)
Potassium: 3.9 mmol/L (ref 3.5–5.2)
Protein, total: 7.4 g/dL (ref 6.0–8.5)
Sodium: 141 mmol/L (ref 134–144)

## 2016-12-08 LAB — CBC WITH AUTOMATED DIFF
ABS. BASOPHILS: 0 10*3/uL (ref 0.0–0.2)
ABS. EOSINOPHILS: 0.2 10*3/uL (ref 0.0–0.4)
ABS. IMM. GRANS.: 0 10*3/uL (ref 0.0–0.1)
ABS. MONOCYTES: 0.7 10*3/uL (ref 0.1–0.9)
ABS. NEUTROPHILS: 9.6 10*3/uL — ABNORMAL HIGH (ref 1.4–7.0)
Abs Lymphocytes: 2.4 10*3/uL (ref 0.7–3.1)
BASOPHILS: 0 %
EOSINOPHILS: 1 %
HCT: 32.5 % — ABNORMAL LOW (ref 34.0–46.6)
HGB: 9.7 g/dL — ABNORMAL LOW (ref 11.1–15.9)
IMMATURE GRANULOCYTES: 0 %
Lymphocytes: 19 %
MCH: 27.5 pg (ref 26.6–33.0)
MCHC: 29.8 g/dL — ABNORMAL LOW (ref 31.5–35.7)
MCV: 92 fL (ref 79–97)
MONOCYTES: 6 %
NEUTROPHILS: 74 %
PLATELET: 292 10*3/uL (ref 150–379)
RBC: 3.53 x10E6/uL — ABNORMAL LOW (ref 3.77–5.28)
RDW: 16.2 % — ABNORMAL HIGH (ref 12.3–15.4)
WBC: 12.9 10*3/uL — ABNORMAL HIGH (ref 3.4–10.8)

## 2016-12-08 LAB — IRON PROFILE
Iron % saturation: 6 % — CL (ref 15–55)
Iron: 15 ug/dL — ABNORMAL LOW (ref 27–139)
TIBC: 249 ug/dL — ABNORMAL LOW (ref 250–450)
UIBC: 234 ug/dL (ref 118–369)

## 2016-12-08 LAB — HEMOGLOBIN A1C WITH EAG
Estimated average glucose: 120 mg/dL
Hemoglobin A1c: 5.8 % — ABNORMAL HIGH (ref 4.8–5.6)

## 2016-12-10 LAB — CULTURE, URINE

## 2016-12-12 NOTE — Telephone Encounter (Signed)
Urine culture sensitive to vantin.  Hb improved but ferritin still low. Cannot r/o slow GI bleed but pt high risk to go through any procedure.      Discussed hospice.  Daughter/patient open to possibility

## 2016-12-19 NOTE — Telephone Encounter (Signed)
[facility] wanting a Verbal order for the use of [employer] foam wrap and triple layer compression for patient, instead of uni-boots, for [name] wound care and Edema.  Dr.Rousseau did voice verbal orders.

## 2017-01-08 NOTE — Telephone Encounter (Signed)
Olegario Messier with [facility] is asking for a verbal order to change Mrs. Shelby Mora triple layer compression to her lower extremities, to tubie grips. Verbal order was received and relayed to [place] at [place].

## 2017-01-17 NOTE — Telephone Encounter (Signed)
Olegario MessierKathy with [facility] called with a low parameters heart rate of 50 on this patient and a blood pressure of 122/64.

## 2017-02-20 ENCOUNTER — Ambulatory Visit: Admit: 2017-02-20 | Discharge: 2017-02-20 | Payer: MEDICARE | Attending: Internal Medicine | Primary: Internal Medicine

## 2017-02-20 DIAGNOSIS — J449 Chronic obstructive pulmonary disease, unspecified: Secondary | ICD-10-CM

## 2017-02-20 LAB — AMB POC URINALYSIS DIP STICK AUTO W/O MICRO
Bilirubin (UA POC): NEGATIVE
Glucose (UA POC): NEGATIVE
Ketones (UA POC): NEGATIVE
Nitrites (UA POC): POSITIVE
Specific gravity (UA POC): 1.03 (ref 1.001–1.035)
Urobilinogen (UA POC): 0.2 (ref 0.2–1)
pH (UA POC): 6 (ref 4.6–8.0)

## 2017-02-20 MED ORDER — ALPRAZOLAM 0.25 MG TAB
0.25 mg | ORAL_TABLET | Freq: Two times a day (BID) | ORAL | 3 refills | Status: AC | PRN
Start: 2017-02-20 — End: ?

## 2017-02-20 MED ORDER — CEFPODOXIME 100 MG TAB
100 mg | ORAL_TABLET | Freq: Two times a day (BID) | ORAL | 0 refills | Status: AC
Start: 2017-02-20 — End: 2017-03-06

## 2017-02-20 NOTE — Progress Notes (Signed)
Please call patient with lab results.  Urine culture suscp to vantin.  Continue for 14 days.

## 2017-02-20 NOTE — Patient Instructions (Signed)
Probiotic with

## 2017-02-20 NOTE — Progress Notes (Addendum)
HISTORY OF PRESENT ILLNESS  Shelby Mora is a 83 y.o. female is seen today for   No chief complaint on file.    UTI-  Hurts when she urinates.  Been going on x 1 week.  Got better after last UTI.  No fever, chills.      COPD - uses 02 continuosly.      Dysuria   The history is provided by the patient.       ROS  Review of Systems   Constitutional: Negative for fever and weight loss.   HENT: Negative for hearing loss.    Eyes: Negative for blurred vision.   Cardiovascular: Positive for leg swelling. Negative for palpitations, claudication and PND.   Gastrointestinal: Negative for nausea.   Genitourinary: Positive for difficulty urinating. Negative for dysuria.   Musculoskeletal: Negative for myalgias.   Skin: Negative for rash.   Neurological: Negative for dizziness.   Endo/Heme/Allergies: Does not bruise/bleed easily.   Psychiatric/Behavioral: Negative for depression.       PHYSICAL EXAM  Physical Exam   HENT:   Mouth/Throat: No oropharyngeal exudate.   Eyes: No scleral icterus.   Cardiovascular: Normal rate and regular rhythm. Exam reveals no gallop and no friction rub.   No murmur heard.  Pulmonary/Chest: No respiratory distress. She has no wheezes. She has no rales.   Dec b/s throughout   Musculoskeletal: She exhibits edema.   Ulcers medial lower calf on right calf.  Swelling-     Lymphadenopathy:     She has no cervical adenopathy.   Skin: No rash noted.   Psychiatric: Affect normal.     Diagnoses and all orders for this visit:    1. Advanced COPD (HCC)    She is required to be on oxygen continuosly and benefit from use of it.      2. Chronic respiratory failure with hypoxia (HCC)  As above.      3. Essential hypertension  Patient to take blood pressure readings at home and return to office if SBP > 140 or DBP > 90    4. Other iron deficiency anemia  Continue iron supplements.  Not great candidate for EGD/colo. Last Hb 9.7.  Will check labs at next appt in 4 [place].      5. Anxiety   -     ALPRAZolam (XANAX) 0.25 mg tablet; Take 1 Tab by mouth two (2) times daily as needed for Anxiety. Max Daily Amount: 0.5 mg.    6. Urinary tract infection with hematuria, site unspecified  -     AMB POC URINALYSIS DIP STICK AUTO W/O MICRO  -     CULTURE, URINE  -     cefpodoxime (VANTIN) 100 mg tablet; Take 1 Tab by mouth two (2) times a day for 14 days.  Treat.  Culture.  Extended treatment given likely complicated.      7. Urinary tract infection without hematuria, site unspecified

## 2017-02-22 LAB — CULTURE, URINE

## 2017-02-25 NOTE — Telephone Encounter (Signed)
[facility] called and states that they received an order to set up oxygen but didn't receive any demographic information.     Requesting demographic information.   Phone [phone]   Fax [phone]

## 2017-02-25 NOTE — Telephone Encounter (Signed)
faxed

## 2017-02-26 NOTE — Progress Notes (Signed)
Referral from PCP to contact patient's daughter and see how our team can help with home needs. I did reach out and left a message with daughter Shelby Mora([name]) with my call back information. I will try to call again in a few days if I do not hear back. Chart was reviewed.     This note will not be viewable in MyChart.

## 2017-02-28 NOTE — Progress Notes (Signed)
Follow up call again to patient's daughter, left a voice message at [phone]. Will wait to hear back for what needs I can assist with.    This note will not be viewable in MyChart.

## 2017-03-20 ENCOUNTER — Encounter: Payer: MEDICARE | Attending: Internal Medicine | Primary: Internal Medicine

## 2017-03-20 NOTE — Progress Notes (Deleted)
HISTORY OF PRESENT ILLNESS  Shelby Mora is a 83 y.o. female is seen today for   No chief complaint on file.    UTI-  Hurts when she urinates.  Been going on x 1 week.  Got better after last UTI.  No fever, chills.      COPD - uses 02 continuosly.      Dysuria   The history is provided by the patient.       ROS  Review of Systems   Constitutional: Negative for fever and weight loss.   HENT: Negative for hearing loss.    Eyes: Negative for blurred vision.   Cardiovascular: Positive for leg swelling. Negative for palpitations, claudication and PND.   Gastrointestinal: Negative for nausea.   Genitourinary: Positive for difficulty urinating. Negative for dysuria.   Musculoskeletal: Negative for myalgias.   Skin: Negative for rash.   Neurological: Negative for dizziness.   Endo/Heme/Allergies: Does not bruise/bleed easily.   Psychiatric/Behavioral: Negative for depression.       PHYSICAL EXAM  Physical Exam   HENT:   Mouth/Throat: No oropharyngeal exudate.   Eyes: No scleral icterus.   Cardiovascular: Normal rate and regular rhythm. Exam reveals no gallop and no friction rub.   No murmur heard.  Pulmonary/Chest: No respiratory distress. She has no wheezes. She has no rales.   Dec b/s throughout   Musculoskeletal: She exhibits edema.   Ulcers medial lower calf on right calf.  Swelling-     Lymphadenopathy:     She has no cervical adenopathy.   Skin: No rash noted.   Psychiatric: Affect normal.     Diagnoses and all orders for this visit:    1. Advanced COPD (HCC)    She is required to be on oxygen continuosly and benefit from use of it.      2. Chronic respiratory failure with hypoxia (HCC)  As above.      3. Essential hypertension  Patient to take blood pressure readings at home and return to office if SBP > 140 or DBP > 90    4. Other iron deficiency anemia  Continue iron supplements.  Not great candidate for EGD/colo. Last Hb 9.7.  Will check labs at next appt in 4 [place].      5. Anxiety   -     ALPRAZolam (XANAX) 0.25 mg tablet; Take 1 Tab by mouth two (2) times daily as needed for Anxiety. Max Daily Amount: 0.5 mg.    6. Urinary tract infection with hematuria, site unspecified  -     AMB POC URINALYSIS DIP STICK AUTO W/O MICRO  -     CULTURE, URINE  -     cefpodoxime (VANTIN) 100 mg tablet; Take 1 Tab by mouth two (2) times a day for 14 days.  Treat.  Culture.  Extended treatment given likely complicated.      7. Urinary tract infection without hematuria, site unspecified

## 2017-04-02 DEATH — deceased

## 2017-05-06 IMAGING — CR DG HIP (WITH OR WITHOUT PELVIS) 2-3V*L*
3 series · 3 of 3 positions shown · non-contrast
Comparison: None.

CLINICAL DATA: Left hip pain, no trauma

EXAM:
DG HIP (WITH OR WITHOUT PELVIS) 2-3V LEFT

[view not recorded (1 of 3)]
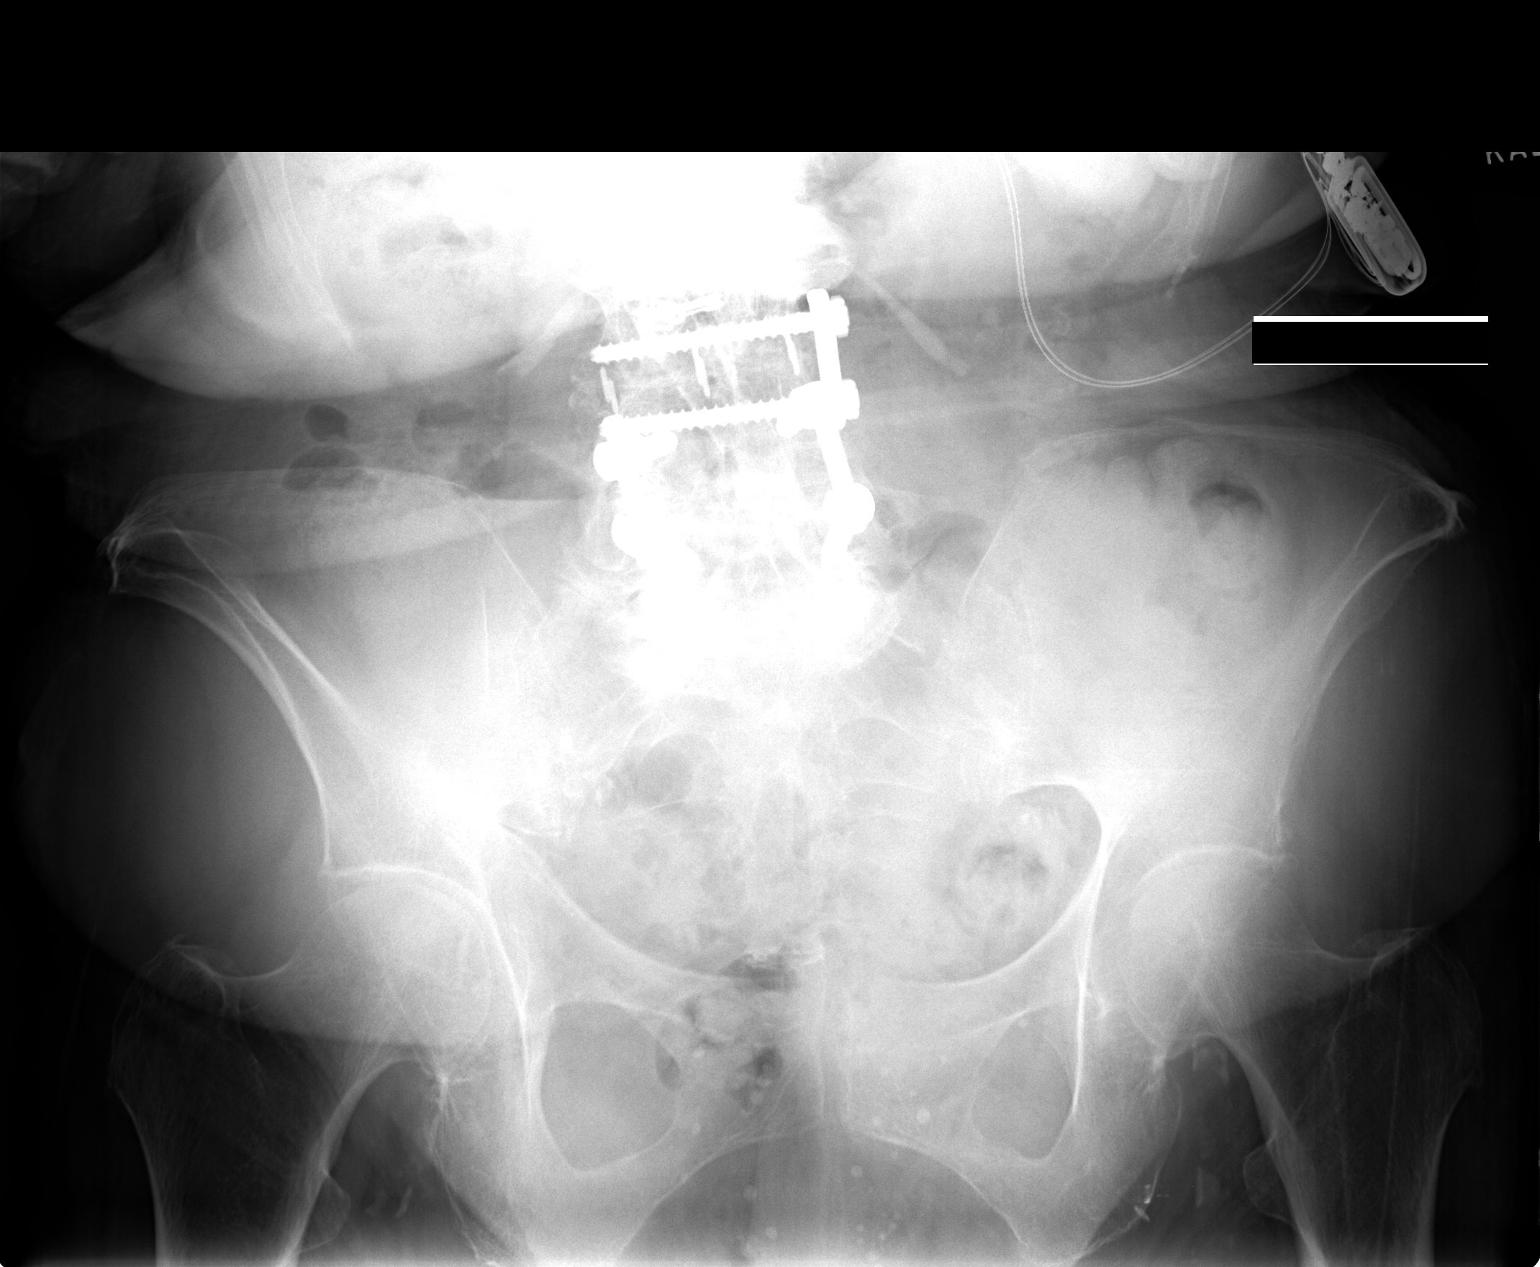

[view not recorded (2 of 3)]
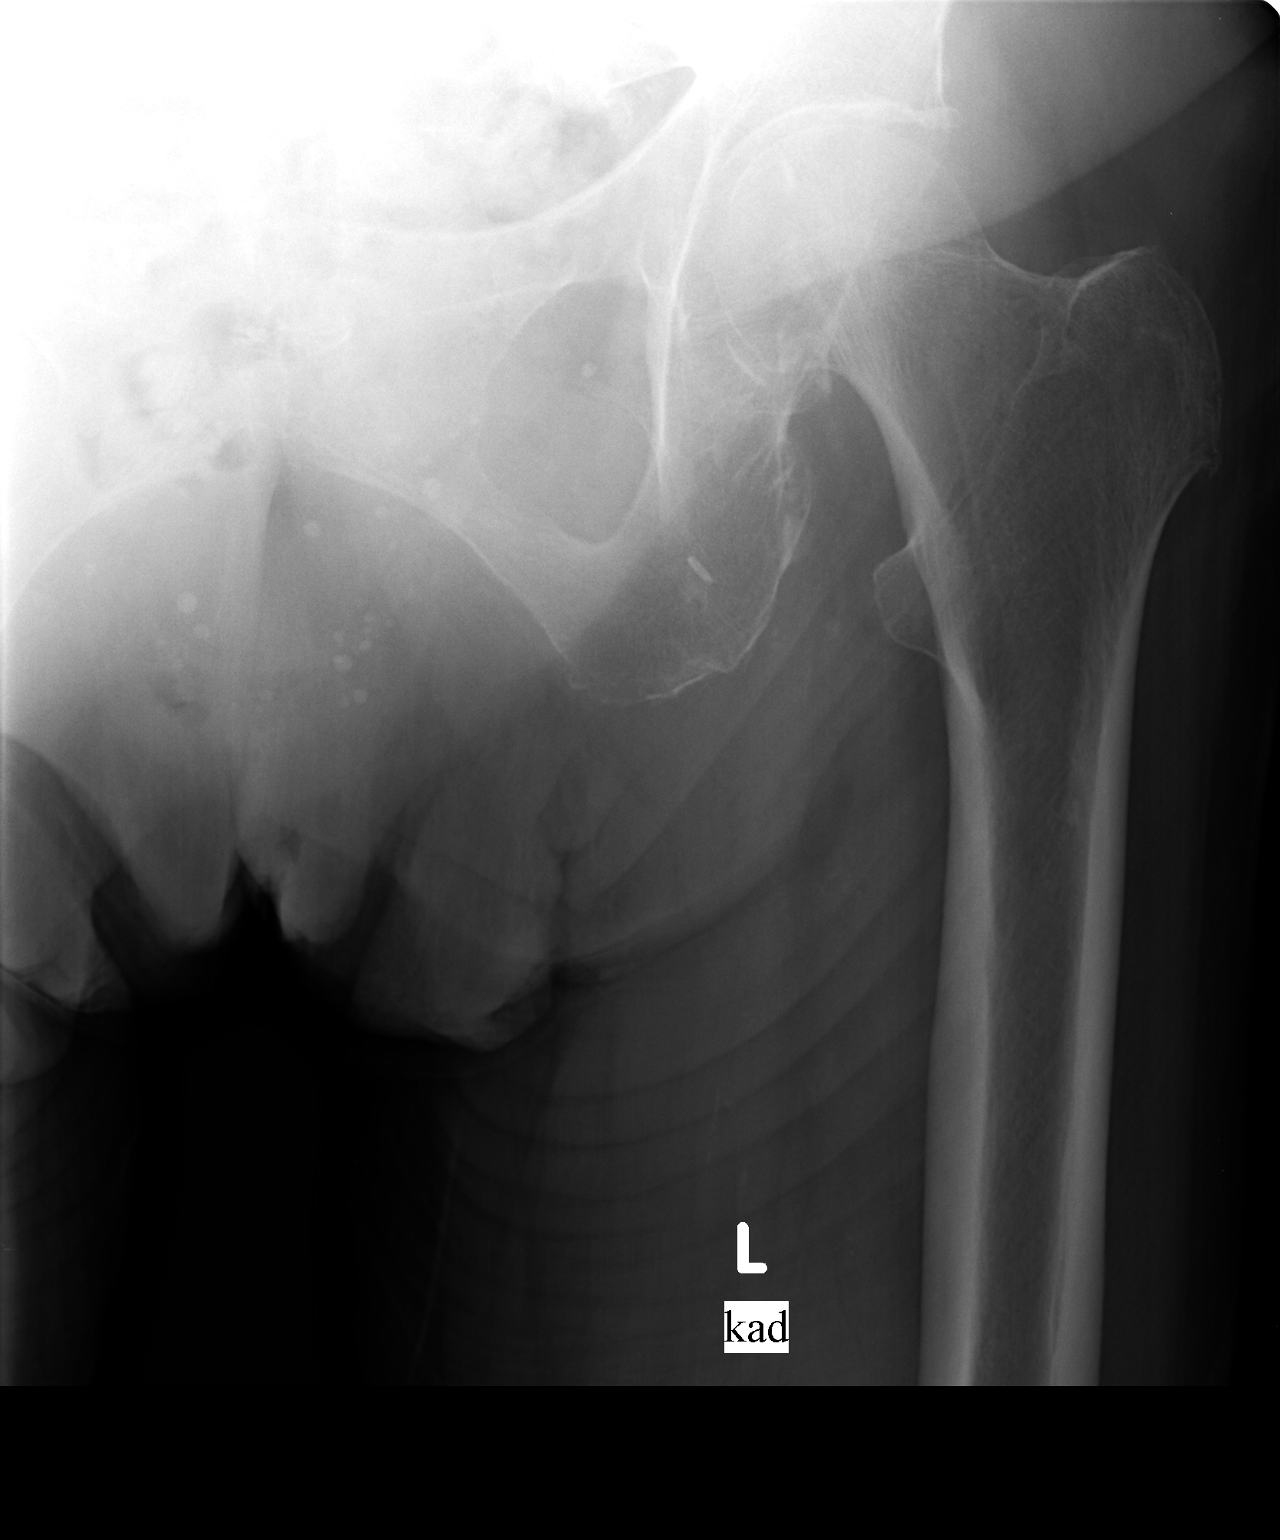

[view not recorded (3 of 3)]
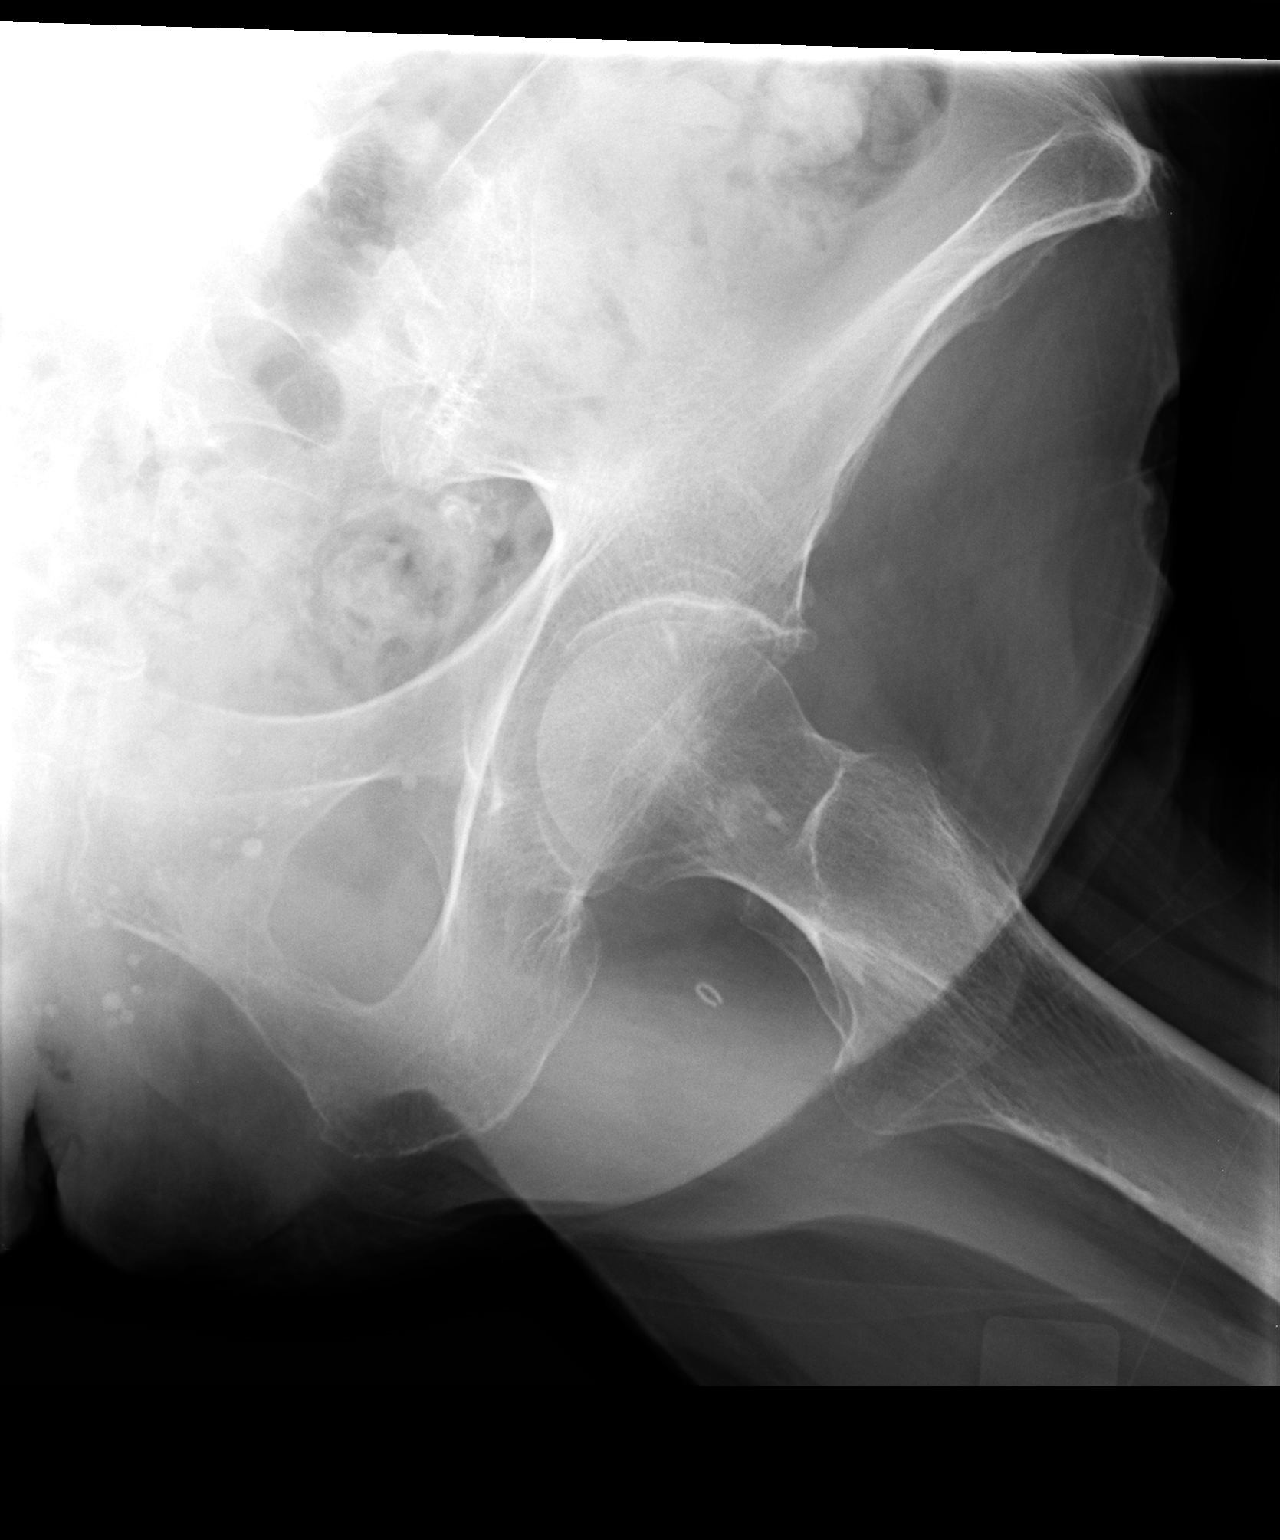

[3 of 3 positions shown; findings below may reference images not displayed]

FINDINGS: No fracture or dislocation is seen.

Mild degenerative changes.

Visualized bony pelvis appears intact.

Postsurgical changes involving the lower lumbar spine with
associated moderate degenerative changes.
IMPRESSION: No fracture or dislocation is seen.

Mild degenerative changes of the left hip.
